# Patient Record
Sex: Male | Born: 1940 | Race: White | Hispanic: No | Marital: Married | Smoking: Former smoker
Health system: Southern US, Community
[De-identification: ages and names within clinical notes are randomized; demographics above are authoritative.]

## PROBLEM LIST (undated history)

## (undated) DIAGNOSIS — J189 Pneumonia, unspecified organism: Secondary | ICD-10-CM

## (undated) DIAGNOSIS — M5136 Other intervertebral disc degeneration, lumbar region: Secondary | ICD-10-CM

## (undated) DIAGNOSIS — J449 Chronic obstructive pulmonary disease, unspecified: Secondary | ICD-10-CM

## (undated) DIAGNOSIS — I639 Cerebral infarction, unspecified: Secondary | ICD-10-CM

## (undated) DIAGNOSIS — R972 Elevated prostate specific antigen [PSA]: Secondary | ICD-10-CM

## (undated) DIAGNOSIS — M51369 Other intervertebral disc degeneration, lumbar region without mention of lumbar back pain or lower extremity pain: Secondary | ICD-10-CM

## (undated) DIAGNOSIS — E119 Type 2 diabetes mellitus without complications: Secondary | ICD-10-CM

## (undated) DIAGNOSIS — I10 Essential (primary) hypertension: Secondary | ICD-10-CM

## (undated) DIAGNOSIS — E785 Hyperlipidemia, unspecified: Secondary | ICD-10-CM

## (undated) DIAGNOSIS — IMO0002 Reserved for concepts with insufficient information to code with codable children: Secondary | ICD-10-CM

## (undated) HISTORY — DX: Essential (primary) hypertension: I10

## (undated) HISTORY — DX: Other intervertebral disc degeneration, lumbar region: M51.36

## (undated) HISTORY — DX: Chronic obstructive pulmonary disease, unspecified: J44.9

## (undated) HISTORY — DX: Pneumonia, unspecified organism: J18.9

## (undated) HISTORY — DX: Other intervertebral disc degeneration, lumbar region without mention of lumbar back pain or lower extremity pain: M51.369

## (undated) HISTORY — DX: Hyperlipidemia, unspecified: E78.5

## (undated) HISTORY — PX: CATARACT EXTRACTION W/ INTRAOCULAR LENS  IMPLANT, BILATERAL: SHX1307

## (undated) HISTORY — DX: Type 2 diabetes mellitus without complications: E11.9

## (undated) HISTORY — DX: Elevated prostate specific antigen (PSA): R97.20

## (undated) HISTORY — DX: Reserved for concepts with insufficient information to code with codable children: IMO0002

---

## 2004-09-06 HISTORY — PX: COLONOSCOPY: SHX174

## 2011-10-18 DIAGNOSIS — J0100 Acute maxillary sinusitis, unspecified: Secondary | ICD-10-CM | POA: Diagnosis not present

## 2011-10-18 DIAGNOSIS — R05 Cough: Secondary | ICD-10-CM | POA: Diagnosis not present

## 2011-11-11 DIAGNOSIS — IMO0001 Reserved for inherently not codable concepts without codable children: Secondary | ICD-10-CM | POA: Diagnosis not present

## 2011-11-11 DIAGNOSIS — E119 Type 2 diabetes mellitus without complications: Secondary | ICD-10-CM | POA: Diagnosis not present

## 2011-11-11 DIAGNOSIS — Z6835 Body mass index (BMI) 35.0-35.9, adult: Secondary | ICD-10-CM | POA: Diagnosis not present

## 2011-11-11 DIAGNOSIS — Z79899 Other long term (current) drug therapy: Secondary | ICD-10-CM | POA: Diagnosis not present

## 2011-11-11 DIAGNOSIS — I10 Essential (primary) hypertension: Secondary | ICD-10-CM | POA: Diagnosis not present

## 2011-11-11 DIAGNOSIS — E785 Hyperlipidemia, unspecified: Secondary | ICD-10-CM | POA: Diagnosis not present

## 2012-02-24 DIAGNOSIS — I10 Essential (primary) hypertension: Secondary | ICD-10-CM | POA: Diagnosis not present

## 2012-02-24 DIAGNOSIS — L570 Actinic keratosis: Secondary | ICD-10-CM | POA: Diagnosis not present

## 2012-02-24 DIAGNOSIS — L821 Other seborrheic keratosis: Secondary | ICD-10-CM | POA: Diagnosis not present

## 2012-02-24 DIAGNOSIS — L820 Inflamed seborrheic keratosis: Secondary | ICD-10-CM | POA: Diagnosis not present

## 2012-02-24 DIAGNOSIS — E785 Hyperlipidemia, unspecified: Secondary | ICD-10-CM | POA: Diagnosis not present

## 2012-02-24 DIAGNOSIS — M67919 Unspecified disorder of synovium and tendon, unspecified shoulder: Secondary | ICD-10-CM | POA: Diagnosis not present

## 2012-05-30 DIAGNOSIS — E785 Hyperlipidemia, unspecified: Secondary | ICD-10-CM | POA: Diagnosis not present

## 2012-05-30 DIAGNOSIS — IMO0001 Reserved for inherently not codable concepts without codable children: Secondary | ICD-10-CM | POA: Diagnosis not present

## 2012-05-30 DIAGNOSIS — I10 Essential (primary) hypertension: Secondary | ICD-10-CM | POA: Diagnosis not present

## 2012-05-30 DIAGNOSIS — N529 Male erectile dysfunction, unspecified: Secondary | ICD-10-CM | POA: Diagnosis not present

## 2012-06-20 DIAGNOSIS — M545 Low back pain, unspecified: Secondary | ICD-10-CM | POA: Diagnosis not present

## 2012-06-20 DIAGNOSIS — I700 Atherosclerosis of aorta: Secondary | ICD-10-CM | POA: Diagnosis not present

## 2012-06-20 DIAGNOSIS — R0602 Shortness of breath: Secondary | ICD-10-CM | POA: Diagnosis not present

## 2012-06-20 DIAGNOSIS — J449 Chronic obstructive pulmonary disease, unspecified: Secondary | ICD-10-CM | POA: Diagnosis not present

## 2012-06-21 DIAGNOSIS — J449 Chronic obstructive pulmonary disease, unspecified: Secondary | ICD-10-CM | POA: Diagnosis not present

## 2012-06-21 DIAGNOSIS — R0602 Shortness of breath: Secondary | ICD-10-CM | POA: Diagnosis not present

## 2012-08-15 DIAGNOSIS — M545 Low back pain: Secondary | ICD-10-CM | POA: Diagnosis not present

## 2012-09-15 DIAGNOSIS — IMO0001 Reserved for inherently not codable concepts without codable children: Secondary | ICD-10-CM | POA: Diagnosis not present

## 2012-09-15 DIAGNOSIS — I10 Essential (primary) hypertension: Secondary | ICD-10-CM | POA: Diagnosis not present

## 2012-09-15 DIAGNOSIS — J209 Acute bronchitis, unspecified: Secondary | ICD-10-CM | POA: Diagnosis not present

## 2012-09-15 DIAGNOSIS — Z6834 Body mass index (BMI) 34.0-34.9, adult: Secondary | ICD-10-CM | POA: Diagnosis not present

## 2012-09-15 DIAGNOSIS — E785 Hyperlipidemia, unspecified: Secondary | ICD-10-CM | POA: Diagnosis not present

## 2012-12-27 DIAGNOSIS — E785 Hyperlipidemia, unspecified: Secondary | ICD-10-CM | POA: Diagnosis not present

## 2012-12-27 DIAGNOSIS — Z6834 Body mass index (BMI) 34.0-34.9, adult: Secondary | ICD-10-CM | POA: Diagnosis not present

## 2012-12-27 DIAGNOSIS — Z1331 Encounter for screening for depression: Secondary | ICD-10-CM | POA: Diagnosis not present

## 2012-12-27 DIAGNOSIS — J449 Chronic obstructive pulmonary disease, unspecified: Secondary | ICD-10-CM | POA: Diagnosis not present

## 2012-12-27 DIAGNOSIS — Z9181 History of falling: Secondary | ICD-10-CM | POA: Diagnosis not present

## 2013-04-04 DIAGNOSIS — Z6834 Body mass index (BMI) 34.0-34.9, adult: Secondary | ICD-10-CM | POA: Diagnosis not present

## 2013-04-04 DIAGNOSIS — I10 Essential (primary) hypertension: Secondary | ICD-10-CM | POA: Diagnosis not present

## 2013-04-04 DIAGNOSIS — E785 Hyperlipidemia, unspecified: Secondary | ICD-10-CM | POA: Diagnosis not present

## 2013-06-14 DIAGNOSIS — J209 Acute bronchitis, unspecified: Secondary | ICD-10-CM | POA: Diagnosis not present

## 2013-06-14 DIAGNOSIS — M5137 Other intervertebral disc degeneration, lumbosacral region: Secondary | ICD-10-CM | POA: Diagnosis not present

## 2013-06-14 DIAGNOSIS — Z23 Encounter for immunization: Secondary | ICD-10-CM | POA: Diagnosis not present

## 2013-07-16 DIAGNOSIS — Z23 Encounter for immunization: Secondary | ICD-10-CM | POA: Diagnosis not present

## 2013-07-16 DIAGNOSIS — J449 Chronic obstructive pulmonary disease, unspecified: Secondary | ICD-10-CM | POA: Diagnosis not present

## 2013-07-16 DIAGNOSIS — Z125 Encounter for screening for malignant neoplasm of prostate: Secondary | ICD-10-CM | POA: Diagnosis not present

## 2013-07-16 DIAGNOSIS — I10 Essential (primary) hypertension: Secondary | ICD-10-CM | POA: Diagnosis not present

## 2013-07-16 DIAGNOSIS — E785 Hyperlipidemia, unspecified: Secondary | ICD-10-CM | POA: Diagnosis not present

## 2013-07-16 DIAGNOSIS — R972 Elevated prostate specific antigen [PSA]: Secondary | ICD-10-CM

## 2013-07-16 HISTORY — DX: Elevated prostate specific antigen (PSA): R97.20

## 2013-08-01 DIAGNOSIS — H6120 Impacted cerumen, unspecified ear: Secondary | ICD-10-CM | POA: Diagnosis not present

## 2013-08-07 DIAGNOSIS — E785 Hyperlipidemia, unspecified: Secondary | ICD-10-CM | POA: Diagnosis not present

## 2013-10-15 DIAGNOSIS — J441 Chronic obstructive pulmonary disease with (acute) exacerbation: Secondary | ICD-10-CM | POA: Diagnosis not present

## 2013-10-15 DIAGNOSIS — Z6834 Body mass index (BMI) 34.0-34.9, adult: Secondary | ICD-10-CM | POA: Diagnosis not present

## 2013-11-12 DIAGNOSIS — IMO0001 Reserved for inherently not codable concepts without codable children: Secondary | ICD-10-CM | POA: Diagnosis not present

## 2013-11-12 DIAGNOSIS — R319 Hematuria, unspecified: Secondary | ICD-10-CM | POA: Diagnosis not present

## 2013-11-12 DIAGNOSIS — H6120 Impacted cerumen, unspecified ear: Secondary | ICD-10-CM | POA: Diagnosis not present

## 2013-11-12 DIAGNOSIS — Z6834 Body mass index (BMI) 34.0-34.9, adult: Secondary | ICD-10-CM | POA: Diagnosis not present

## 2013-11-12 DIAGNOSIS — E785 Hyperlipidemia, unspecified: Secondary | ICD-10-CM | POA: Diagnosis not present

## 2013-11-12 DIAGNOSIS — I10 Essential (primary) hypertension: Secondary | ICD-10-CM | POA: Diagnosis not present

## 2014-01-14 DIAGNOSIS — Z1211 Encounter for screening for malignant neoplasm of colon: Secondary | ICD-10-CM | POA: Diagnosis not present

## 2014-01-14 DIAGNOSIS — E119 Type 2 diabetes mellitus without complications: Secondary | ICD-10-CM | POA: Diagnosis not present

## 2014-02-12 DIAGNOSIS — K621 Rectal polyp: Secondary | ICD-10-CM | POA: Diagnosis not present

## 2014-02-12 DIAGNOSIS — K620 Anal polyp: Secondary | ICD-10-CM | POA: Diagnosis not present

## 2014-02-12 DIAGNOSIS — Z1211 Encounter for screening for malignant neoplasm of colon: Secondary | ICD-10-CM | POA: Diagnosis not present

## 2014-02-12 DIAGNOSIS — D126 Benign neoplasm of colon, unspecified: Secondary | ICD-10-CM | POA: Diagnosis not present

## 2014-02-12 DIAGNOSIS — K5730 Diverticulosis of large intestine without perforation or abscess without bleeding: Secondary | ICD-10-CM | POA: Diagnosis not present

## 2014-02-12 DIAGNOSIS — I10 Essential (primary) hypertension: Secondary | ICD-10-CM | POA: Diagnosis not present

## 2014-02-12 DIAGNOSIS — E119 Type 2 diabetes mellitus without complications: Secondary | ICD-10-CM | POA: Diagnosis not present

## 2014-02-12 LAB — HM COLONOSCOPY: HM COLON: ABNORMAL

## 2014-02-27 DIAGNOSIS — E785 Hyperlipidemia, unspecified: Secondary | ICD-10-CM | POA: Diagnosis not present

## 2014-02-27 DIAGNOSIS — Z6833 Body mass index (BMI) 33.0-33.9, adult: Secondary | ICD-10-CM | POA: Diagnosis not present

## 2014-02-27 DIAGNOSIS — L821 Other seborrheic keratosis: Secondary | ICD-10-CM | POA: Diagnosis not present

## 2014-02-27 DIAGNOSIS — I10 Essential (primary) hypertension: Secondary | ICD-10-CM | POA: Diagnosis not present

## 2014-02-27 DIAGNOSIS — IMO0001 Reserved for inherently not codable concepts without codable children: Secondary | ICD-10-CM | POA: Diagnosis not present

## 2014-02-27 DIAGNOSIS — D1739 Benign lipomatous neoplasm of skin and subcutaneous tissue of other sites: Secondary | ICD-10-CM | POA: Diagnosis not present

## 2014-02-27 DIAGNOSIS — L570 Actinic keratosis: Secondary | ICD-10-CM | POA: Diagnosis not present

## 2014-02-27 DIAGNOSIS — J449 Chronic obstructive pulmonary disease, unspecified: Secondary | ICD-10-CM | POA: Diagnosis not present

## 2014-02-27 DIAGNOSIS — Z1331 Encounter for screening for depression: Secondary | ICD-10-CM | POA: Diagnosis not present

## 2014-02-27 DIAGNOSIS — Z9181 History of falling: Secondary | ICD-10-CM | POA: Diagnosis not present

## 2014-03-28 DIAGNOSIS — J029 Acute pharyngitis, unspecified: Secondary | ICD-10-CM | POA: Diagnosis not present

## 2014-03-28 DIAGNOSIS — J40 Bronchitis, not specified as acute or chronic: Secondary | ICD-10-CM | POA: Diagnosis not present

## 2014-06-03 DIAGNOSIS — Z79899 Other long term (current) drug therapy: Secondary | ICD-10-CM | POA: Diagnosis not present

## 2014-06-03 DIAGNOSIS — E785 Hyperlipidemia, unspecified: Secondary | ICD-10-CM | POA: Diagnosis not present

## 2014-06-03 DIAGNOSIS — Z6834 Body mass index (BMI) 34.0-34.9, adult: Secondary | ICD-10-CM | POA: Diagnosis not present

## 2014-06-03 DIAGNOSIS — IMO0001 Reserved for inherently not codable concepts without codable children: Secondary | ICD-10-CM | POA: Diagnosis not present

## 2014-06-03 DIAGNOSIS — I10 Essential (primary) hypertension: Secondary | ICD-10-CM | POA: Diagnosis not present

## 2014-06-03 DIAGNOSIS — Z23 Encounter for immunization: Secondary | ICD-10-CM | POA: Diagnosis not present

## 2014-06-03 LAB — HEMOGLOBIN A1C: Hgb A1c MFr Bld: 9.5 % — AB (ref 4.0–6.0)

## 2014-06-03 LAB — LIPID PANEL
Cholesterol: 90 mg/dL (ref 0–200)
HDL: 39 mg/dL (ref 35–70)
LDL Cholesterol: 24 mg/dL
LDL/HDL RATIO: 0.6
TRIGLYCERIDES: 133 mg/dL (ref 40–160)

## 2014-06-03 LAB — HM DIABETES FOOT EXAM: HM Diabetic Foot Exam: NORMAL

## 2014-07-15 LAB — HEPATIC FUNCTION PANEL
ALT: 15 U/L (ref 10–40)
AST: 14 U/L (ref 14–40)
Alkaline Phosphatase: 123 U/L (ref 25–125)
Bilirubin, Total: 0.5 mg/dL

## 2014-07-15 LAB — BASIC METABOLIC PANEL
BUN: 19 mg/dL (ref 4–21)
CREATININE: 0.8 mg/dL (ref 0.6–1.3)
GLUCOSE: 162 mg/dL
Potassium: 4.8 mmol/L (ref 3.4–5.3)
SODIUM: 136 mmol/L — AB (ref 137–147)

## 2014-08-21 ENCOUNTER — Encounter: Payer: Self-pay | Admitting: Internal Medicine

## 2014-08-21 ENCOUNTER — Ambulatory Visit (INDEPENDENT_AMBULATORY_CARE_PROVIDER_SITE_OTHER): Payer: Medicare Other | Admitting: Internal Medicine

## 2014-08-21 VITALS — BP 122/64 | HR 81 | Temp 98.0°F | Resp 12 | Ht 72.0 in | Wt 260.0 lb

## 2014-08-21 DIAGNOSIS — E1165 Type 2 diabetes mellitus with hyperglycemia: Secondary | ICD-10-CM | POA: Insufficient documentation

## 2014-08-21 DIAGNOSIS — N182 Chronic kidney disease, stage 2 (mild): Secondary | ICD-10-CM | POA: Insufficient documentation

## 2014-08-21 DIAGNOSIS — R809 Proteinuria, unspecified: Secondary | ICD-10-CM | POA: Diagnosis not present

## 2014-08-21 DIAGNOSIS — E1169 Type 2 diabetes mellitus with other specified complication: Secondary | ICD-10-CM | POA: Insufficient documentation

## 2014-08-21 DIAGNOSIS — IMO0002 Reserved for concepts with insufficient information to code with codable children: Secondary | ICD-10-CM | POA: Insufficient documentation

## 2014-08-21 DIAGNOSIS — E1129 Type 2 diabetes mellitus with other diabetic kidney complication: Secondary | ICD-10-CM | POA: Insufficient documentation

## 2014-08-21 MED ORDER — INSULIN PEN NEEDLE 32G X 4 MM MISC: Status: DC

## 2014-08-21 MED ORDER — GLIPIZIDE ER 5 MG PO TB24: 10.0000 mg | ORAL_TABLET | Freq: Every day | ORAL | Status: DC

## 2014-08-21 MED ORDER — INSULIN GLARGINE 300 UNIT/ML ~~LOC~~ SOPN: 80.0000 [IU] | PEN_INJECTOR | Freq: Every day | SUBCUTANEOUS | Status: DC

## 2014-08-21 NOTE — Patient Instructions (Signed)
Please continue Metformin 1000 mg 2x a day. Stop Lantus. Start Toujeo 80 units at bedtime. Start Glipizide XL 5 mg in am. In 1 week, if sugars later in the day are higher than 150, please increase to 10 mg daily in am.  Please return in 1 month with your sugar log.   PATIENT INSTRUCTIONS FOR TYPE 2 DIABETES:  **Please join MyChart!** - see attached instructions about how to join if you have not done so already.  DIET AND EXERCISE Diet and exercise is an important part of diabetic treatment.  We recommended aerobic exercise in the form of brisk walking (working between 40-60% of maximal aerobic capacity, similar to brisk walking) for 150 minutes per week (such as 30 minutes five days per week) along with 3 times per week performing 'resistance' training (using various gauge rubber tubes with handles) 5-10 exercises involving the major muscle groups (upper body, lower body and core) performing 10-15 repetitions (or near fatigue) each exercise. Start at half the above goal but build slowly to reach the above goals. If limited by weight, joint pain, or disability, we recommend daily walking in a swimming pool with water up to waist to reduce pressure from joints while allow for adequate exercise.    BLOOD GLUCOSES Monitoring your blood glucoses is important for continued management of your diabetes. Please check your blood glucoses 2-4 times a day: fasting, before meals and at bedtime (you can rotate these measurements - e.g. one day check before the 3 meals, the next day check before 2 of the meals and before bedtime, etc.).   HYPOGLYCEMIA (low blood sugar) Hypoglycemia is usually a reaction to not eating, exercising, or taking too much insulin/ other diabetes drugs.  Symptoms include tremors, sweating, hunger, confusion, headache, etc. Treat IMMEDIATELY with 15 grams of Carbs: . 4 glucose tablets .  cup regular juice/soda . 2 tablespoons raisins . 4 teaspoons sugar . 1 tablespoon  honey Recheck blood glucose in 15 mins and repeat above if still symptomatic/blood glucose <100.  RECOMMENDATIONS TO REDUCE YOUR RISK OF DIABETIC COMPLICATIONS: * Take your prescribed MEDICATION(S) * Follow a DIABETIC diet: Complex carbs, fiber rich foods, (monounsaturated and polyunsaturated) fats * AVOID saturated/trans fats, high fat foods, >2,300 mg salt per day. * EXERCISE at least 5 times a week for 30 minutes or preferably daily.  * DO NOT SMOKE OR DRINK more than 1 drink a day. * Check your FEET every day. Do not wear tightfitting shoes. Contact us if you develop an ulcer * See your EYE doctor once a year or more if needed * Get a FLU shot once a year * Get a PNEUMONIA vaccine once before and once after age 62 years  GOALS:  * Your Hemoglobin A1c of <7%  * fasting sugars need to be <130 * after meals sugars need to be <180 (2h after you start eating) * Your Systolic BP should be 947 or lower  * Your Diastolic BP should be 80 or lower  * Your HDL (Good Cholesterol) should be 40 or higher  * Your LDL (Bad Cholesterol) should be 100 or lower. * Your Triglycerides should be 150 or lower  * Your Urine microalbumin (kidney function) should be <30 * Your Body Mass Index should be 25 or lower    Please consider the following ways to cut down carbs and fat and increase fiber and micronutrients in your diet: - substitute whole grain for white bread or pasta - substitute brown rice for white  rice - substitute 90-calorie flat bread pieces for slices of bread when possible - substitute sweet potatoes or yams for white potatoes - substitute humus for margarine - substitute tofu for cheese when possible - substitute almond or rice milk for regular milk (would not drink soy milk daily due to concern for soy estrogen influence on breast cancer risk) - substitute dark chocolate for other sweets when possible - substitute water - can add lemon or orange slices for taste - for diet sodas  (artificial sweeteners will trick your body that you can eat sweets without getting calories and will lead you to overeating and weight gain in the long run) - do not skip breakfast or other meals (this will slow down the metabolism and will result in more weight gain over time)  - can try smoothies made from fruit and almond/rice milk in am instead of regular breakfast - can also try old-fashioned (not instant) oatmeal made with almond/rice milk in am - order the dressing on the side when eating salad at a restaurant (pour less than half of the dressing on the salad) - eat as little meat as possible - can try juicing, but should not forget that juicing will get rid of the fiber, so would alternate with eating raw veg./fruits or drinking smoothies - use as little oil as possible, even when using olive oil - can dress a salad with a mix of balsamic vinegar and lemon juice, for e.g. - use agave nectar, stevia sugar, or regular sugar rather than artificial sweateners - steam or broil/roast veggies  - snack on veggies/fruit/nuts (unsalted, preferably) when possible, rather than processed foods - reduce or eliminate aspartame in diet (it is in diet sodas, chewing gum, etc) Read the labels!  Try to read Dr. Janene Harvey book: "Program for Reversing Diabetes" for other ideas for healthy eating.

## 2014-08-21 NOTE — Progress Notes (Signed)
Patient ID: Mathew Baker, male   DOB: Oct 21, 1940, 73 y.o.   MRN: [id number]  HPI: Mathew Baker is a 73 y.o.-year-old male, referred by his PCP, Dr.Schultz, for management of DM2 (agent orange), dx in ~1995, insulin-dependent, uncontrolled, with complications (microalbuminuria)  Last hemoglobin A1c was: 06/03/2014: HbA1c 9.5% 02/27/2014: HbA1c 8.8% Prev. 7.5-9%  Pt is on a regimen of: - Metformin 1000 mg po bid - Lantus (vial) 70 units bid He was on Glipizide >> stopped b/c diarrhea.  Pt checks his sugars 2x a day and they are: - am: 90-130 - 2h after b'fast: n/c - before lunch: n/c - 2h after lunch: n/c - before dinner: n/c - 2h after dinner: n/c - bedtime: 180-212 - nighttime:  n/c + lows. Lowest sugar was 60; he has hypoglycemia awareness at 70.  Highest sugar was 250.  Glucometer: Precision  Pt's meals are: - Breakfast: toast + PB or jelly; sometimes 2 eggs; coffee + cream - Lunch: skips lunch; sometimes banana or bologna sandwich  - Dinner: meat + veggies + starch - Snacks: nabs, fruit: oranges, bananas  He walks 3x a week.  - Last BUN/creatinine:  06/03/2014: 13/0.90 - has MAU: ACR 46.4  << 54.4 On Lisinopril 40 mg daily.  - last set of lipids: 06/03/2014: 90/133/39/24 On Gemfibrozil 600 mg 2x a day, Fish oil, Atorvastatin.  - last eye exam was in 07/2014. No DR. + L glaucoma  - no numbness and tingling in his feet.  Pt has FH of DM in mother.  ROS: Constitutional: + weight gain, + fatigue, no subjective hyperthermia/hypothermia, + mm aches Eyes: + blurry vision (with low sugars), no xerophthalmia ENT: no sore throat, no nodules palpated in throat, no dysphagia/odynophagia, no hoarseness, + tinnitus Cardiovascular: no CP/+ SOB/no palpitations/leg swelling Respiratory: + cough/+ SOB/+ wheezing Gastrointestinal: no N/V/D/C Musculoskeletal: no muscle/joint aches Skin: no rashes, + hair loss Neurological: no  tremors/numbness/tingling/dizziness Psychiatric: no depression/anxiety + diff with erections, + low libido  Past Medical History  Diagnosis Date  . Diabetes mellitus without complication   . Hypertension   . Hyperlipidemia   . Chronic airway obstruction   . Pneumonia, organism unspecified   . Osteoarthritis of lower extremity   . Degeneration of intervertebral disc of lumbar region   . Essential hypertension, benign   . PSA elevation 07/16/2013    Normal   Past Surgical History  Procedure Laterality Date  . Cataract extraction w/ intraocular lens  implant, bilateral    . Colonoscopy  2006    Normal: Repeat in 10 yrs   History   Social History Main Topics  . Smoking status: Former Smoker    Quit date: 09/06/1958  . Smokeless tobacco: Not on file  . Alcohol Use: No  . 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 atorvastatin (LIPITOR) 40 MG tablet   Oral   Take 40 mg by mouth daily.         Marland Kitchen gemfibrozil (LOPID) 600 MG tablet   Oral   Take 600 mg by mouth 2 (two) times daily before a meal.         . lisinopril (PRINIVIL,ZESTRIL) 40 MG tablet   Oral   Take 40 mg by mouth daily.         Marland Kitchen  Magnesium Oxide 420 MG TABS   Oral   Take 1 tablet by mouth daily.         . metFORMIN (GLUCOPHAGE) 1000 MG tablet   Oral   Take 1,000 mg by mouth 2 (two) times daily with a meal.         . prazosin (MINIPRESS) 1 MG capsule   Oral   Take 1 mg by mouth at bedtime.         . tamsulosin (FLOMAX) 0.4 MG CAPS capsule   Oral   Take 0.4 mg by mouth.         . vitamin B-12 (CYANOCOBALAMIN) 250 MCG tablet   Oral   Take 250 mcg by mouth daily.         Marland Kitchen gabapentin (NEURONTIN) 100 MG capsule   Oral   Take 100 mg by mouth daily.         . Insulin Glargine 100 UNIT/ML    Subcutaneous   Inject 70  Units into the skin 2x a day   6 pen   2   . Omega-3 Fatty Acids (FISH OIL) 1000 MG CAPS   Oral   Take 2 capsules by mouth 2 (two) times daily.          No Known Allergies  FH: see HPI  PE: BP 122/64 mmHg  Pulse 81  Temp(Src) 98 F (36.7 C) (Oral)  Resp 12  Ht 6' (1.829 m)  Wt 260 lb (117.935 kg)  BMI 35.25 kg/m2  SpO2 98% Wt Readings from Last 3 Encounters:  08/21/14 260 lb (117.935 kg)   Constitutional: obese, in NAD Eyes: PERRLA, EOMI, no exophthalmos ENT: moist mucous membranes, no thyromegaly, no cervical lymphadenopathy Cardiovascular: RRR, No MRG Respiratory: CTA B Gastrointestinal: abdomen soft, NT, ND, BS+ Musculoskeletal: no deformities, strength intact in all 4 Skin: moist, warm, no rashes Neurological: no tremor with outstretched hands, DTR normal in all 4  ASSESSMENT: 1. DM2, insulin-dependent, uncontrolled, with complications - MAU  PLAN:  1. Patient with long-standing, uncontrolled diabetes, on oral antidiabetic regimen + a large dose of basal insulin, which is insufficient. Sugars at target in am but increase throughout the day, "staircase effect" >> a sign of not enough mealtime coverage. To modify his DM regimen, we have to be mindful of his age and his insurance - We discussed about options for treatment, and I suggested to switch from Lantus to [facility] for better absorption, will decrease the dose to ~ 1/2, and will add Glipizide for mealtime coverage: Patient Instructions  Please continue Metformin 1000 mg 2x a day. Stop Lantus. Start Toujeo 80 units at bedtime. Start Glipizide XL 5 mg in am. In 1 week, if sugars later in the day are higher than 150, please increase to 10 mg daily in am.  Please return in 1 month with your sugar log.   - continue checking sugars at different times of the day - check 2 times a day, rotating checks - given sugar log and advised how to fill it and to bring it at next appt  - given foot care handout and explained  the principles  - given instructions for hypoglycemia management "15-15 rule"  - advised for yearly eye exams >> he is UTD - advised not to skip meals - Return to clinic in 1 mo with sugar log >> will check HbA1c then

## 2014-08-29 ENCOUNTER — Other Ambulatory Visit: Payer: Self-pay | Admitting: *Deleted

## 2014-08-29 ENCOUNTER — Telehealth: Payer: Self-pay | Admitting: Internal Medicine

## 2014-08-29 MED ORDER — INSULIN GLARGINE 300 UNIT/ML ~~LOC~~ SOPN: 80.0000 [IU] | PEN_INJECTOR | Freq: Every day | SUBCUTANEOUS | Status: DC

## 2014-08-29 NOTE — Telephone Encounter (Signed)
Called pt back. He stated that at [facility]) they do not carry Toujeo. Pt does not have enough medication to last him (the rx only last for 18 days). Pt is getting medication locally for now, until he goes back to the [place] at [facility]. Pt would like a refill for 90 days for the Toujeo. Please print. Thank you.

## 2014-08-29 NOTE — Telephone Encounter (Signed)
OK to print and fx the 90 day Toujeo Rx.

## 2014-08-29 NOTE — Telephone Encounter (Signed)
Pt would like a call back if there is another med to rx he wants to pick up the rx and bring it to [place] himself

## 2014-08-29 NOTE — Telephone Encounter (Signed)
Pt wants a 66 day rx when he returns in Jan. Pt just needed more pens to last until Jan 22, his appt with Dr Cruzita Lederer. 6 pens is not enough for 30 days. New rx for 8 pens sent to pt's pharmacy. Pt aware.

## 2014-08-29 NOTE — Telephone Encounter (Signed)
Pt has rx for Toujeo but the rx is not going to last more than 18 days. Pt usually gets his meds through the [place] and they do not carry Toujeo. He had to get it at [facility] and it is too expensive. Can we offer an alternate?

## 2014-09-27 ENCOUNTER — Ambulatory Visit: Payer: Medicare Other | Admitting: Internal Medicine

## 2014-09-30 ENCOUNTER — Other Ambulatory Visit: Payer: Self-pay | Admitting: *Deleted

## 2014-09-30 ENCOUNTER — Other Ambulatory Visit: Payer: Self-pay

## 2014-09-30 ENCOUNTER — Telehealth: Payer: Self-pay | Admitting: Internal Medicine

## 2014-09-30 MED ORDER — METFORMIN HCL 1000 MG PO TABS
1000.0000 mg | ORAL_TABLET | Freq: Two times a day (BID) | ORAL | Status: DC
Start: 2014-09-30 — End: 2014-09-30

## 2014-09-30 MED ORDER — INSULIN GLARGINE 100 UNIT/ML SOLOSTAR PEN: 80.0000 [IU] | PEN_INJECTOR | Freq: Every day | SUBCUTANEOUS | Status: DC

## 2014-09-30 MED ORDER — GLIPIZIDE ER 5 MG PO TB24: 10.0000 mg | ORAL_TABLET | Freq: Every day | ORAL | Status: DC

## 2014-09-30 MED ORDER — METFORMIN HCL 1000 MG PO TABS: 1000.0000 mg | ORAL_TABLET | Freq: Two times a day (BID) | ORAL | Status: DC

## 2014-09-30 NOTE — Telephone Encounter (Signed)
Pt was cancelled from Friday due to weather. He is rescheduled to 12/02/14 due to his work travels  Pt needs written script so he can bring bring to [facility] for his insulin. They carry novolog, lantis, novolin he needs 90 day script for one of these three. [place] does not carry the [employer].

## 2014-09-30 NOTE — Telephone Encounter (Signed)
Switched pt back to Lantus, 80 units at bedtime. Rx for 90 days for Lantus, Glipizide and Metformin printed for pt to take to [facility].

## 2014-12-02 ENCOUNTER — Encounter: Payer: Self-pay | Admitting: Internal Medicine

## 2014-12-02 ENCOUNTER — Other Ambulatory Visit: Payer: Self-pay | Admitting: *Deleted

## 2014-12-02 ENCOUNTER — Ambulatory Visit (INDEPENDENT_AMBULATORY_CARE_PROVIDER_SITE_OTHER): Payer: Medicare Other | Admitting: Internal Medicine

## 2014-12-02 VITALS — BP 114/62 | HR 71 | Temp 97.8°F | Resp 14 | Wt 244.0 lb

## 2014-12-02 DIAGNOSIS — R809 Proteinuria, unspecified: Secondary | ICD-10-CM

## 2014-12-02 DIAGNOSIS — E1165 Type 2 diabetes mellitus with hyperglycemia: Secondary | ICD-10-CM

## 2014-12-02 DIAGNOSIS — IMO0002 Reserved for concepts with insufficient information to code with codable children: Secondary | ICD-10-CM

## 2014-12-02 DIAGNOSIS — E1129 Type 2 diabetes mellitus with other diabetic kidney complication: Secondary | ICD-10-CM

## 2014-12-02 LAB — HEMOGLOBIN A1C: Hgb A1c MFr Bld: 8.3 % — ABNORMAL HIGH (ref 4.6–6.5)

## 2014-12-02 MED ORDER — GLUCOSE BLOOD VI STRP: ORAL_STRIP | Status: DC

## 2014-12-02 MED ORDER — INSULIN GLARGINE 100 UNIT/ML SOLOSTAR PEN: PEN_INJECTOR | SUBCUTANEOUS | Status: DC

## 2014-12-02 MED ORDER — INSULIN GLARGINE 100 UNIT/ML ~~LOC~~ SOLN: 50.0000 [IU] | Freq: Two times a day (BID) | SUBCUTANEOUS | Status: DC

## 2014-12-02 NOTE — Patient Instructions (Signed)
Please continue: - Metformin 1000 mg 2x a day.  - Lantus 50 units 2x a day >> try decrease the dose by 5 units every week.   Please stop at the lab.  Please return in 3 months with your sugar log.

## 2014-12-02 NOTE — Progress Notes (Signed)
Patient ID: Mathew Baker, male   DOB: August 15, 1941, 74 y.o.   MRN: [id number]  HPI: Mathew Baker is a 74 y.o.-year-old male, initially referred by his PCP, Dr.Schultz, returning for f/u for DM2 (agent orange), dx in ~1995, insulin-dependent, uncontrolled, with complications (microalbuminuria). Last visit 2 mo ago.  He lost 16 lbs in last 3 months.  He was hospitalized for PTSD.  Last hemoglobin A1c was: 10/15/2014: HbA1c 8.2% 06/03/2014: HbA1c 9.5% 02/27/2014: HbA1c 8.8% Prev. 7.5-9%  Pt is on a regimen of: - Metformin 1000 mg po bid - Lantus (vial) 70 >> 50 units bid [>> Toujeo 80 units at bedtime (started 09/2014) - but not covered] He stopped Glipizide XL 5 mg in am (started 09/2014) >> not started He was on Glipizide >> stopped b/c diarrhea.  Pt checks his sugars 4x a day and they are: - am: 90-130 >> 90-110 - 2h after b'fast: n/c - before lunch: n/c - 2h after lunch: n/c >> 150s - before dinner: n/c  - 2h after dinner: n/c >> 150-160 - bedtime: 180-212 >> n/c - nighttime:  n/c + lows. Lowest sugar was 60; he has hypoglycemia awareness at 70.  Highest sugar was 250 >> 160s now.  Glucometer: Precision  Pt's meals are: - Breakfast: toast + PB or jelly; sometimes 2 eggs; coffee + cream - Lunch: skips lunch; sometimes banana or bologna sandwich  - Dinner: meat + veggies + starch - Snacks: nabs, fruit: oranges, bananas  He walks 3x a week.  - Last BUN/creatinine:  06/03/2014: 13/0.90 - has MAU: ACR 46.4  << 54.4 On Lisinopril 40 mg daily.  - last set of lipids: 06/03/2014: 90/133/39/24 On Gemfibrozil 600 mg 2x a day, Fish oil, Atorvastatin.  - last eye exam was in 07/2014. No DR. + L glaucoma  - no numbness and tingling in his feet.  ROS: Constitutional: + weight loss, no fatigue, no subjective hyperthermia/hypothermia, + poor sleep Eyes: no blurry vision, no xerophthalmia ENT: no sore throat, no nodules palpated in throat, no dysphagia/odynophagia, no  hoarseness, + tinnitus Cardiovascular: no CP/SOB/no palpitations/leg swelling Respiratory: no cough/SOB/wheezing Gastrointestinal: no N/V/D/C Musculoskeletal: no muscle/joint aches Skin: no rashes Neurological: no tremors/numbness/tingling/dizziness + diff with erections  I reviewed pt's medications, allergies, PMH, social hx, family hx, and changes were documented in the history of present illness. Otherwise, unchanged from my initial visit note.  Past Medical History  Diagnosis Date  . Diabetes mellitus without complication   . Hypertension   . Hyperlipidemia   . Chronic airway obstruction   . Pneumonia, organism unspecified   . Osteoarthritis of lower extremity   . Degeneration of intervertebral disc of lumbar region   . Essential hypertension, benign   . PSA elevation 07/16/2013    Normal   Past Surgical History  Procedure Laterality Date  . Cataract extraction w/ intraocular lens  implant, bilateral    . Colonoscopy  2006    Normal: Repeat in 10 yrs   History   Social History Main Topics  . Smoking status: Former Smoker    Quit date: 09/06/1958  . Smokeless tobacco: Not on file  . Alcohol Use: No  . 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 atorvastatin (LIPITOR) 40 MG tablet   Oral  Take 40 mg by mouth daily.         Marland Kitchen gemfibrozil (LOPID) 600 MG tablet   Oral   Take 600 mg by mouth 2 (two) times daily before a meal.         . lisinopril (PRINIVIL,ZESTRIL) 40 MG tablet   Oral   Take 40 mg by mouth daily.         . Magnesium Oxide 420 MG TABS   Oral   Take 1 tablet by mouth daily.         . metFORMIN (GLUCOPHAGE) 1000 MG tablet   Oral   Take 1,000 mg by mouth 2 (two) times daily with a meal.         . prazosin (MINIPRESS) 1 MG capsule   Oral   Take 1 mg by mouth at  bedtime.         . tamsulosin (FLOMAX) 0.4 MG CAPS capsule   Oral   Take 0.4 mg by mouth.         . vitamin B-12 (CYANOCOBALAMIN) 250 MCG tablet   Oral   Take 250 mcg by mouth daily.         Marland Kitchen gabapentin (NEURONTIN) 100 MG capsule   Oral   Take 100 mg by mouth daily.         . Insulin Glargine 100 UNIT/ML    Subcutaneous   Inject 70 Units into the skin 2x a day   6 pen   2   . Omega-3 Fatty Acids (FISH OIL) 1000 MG CAPS   Oral   Take 2 capsules by mouth 2 (two) times daily.          No Known Allergies  FH: see HPI  PE: BP 114/62 mmHg  Pulse 71  Temp(Src) 97.8 F (36.6 C) (Oral)  Resp 14  Wt 244 lb (110.678 kg)  SpO2 97% Body mass index is 33.09 kg/(m^2). Wt Readings from Last 3 Encounters:  12/02/14 244 lb (110.678 kg)  08/21/14 260 lb (117.935 kg)   Constitutional: obese, in NAD Eyes: PERRLA, EOMI, no exophthalmos ENT: moist mucous membranes, no thyromegaly, no cervical lymphadenopathy Cardiovascular: RRR, No MRG Respiratory: CTA B Gastrointestinal: abdomen soft, NT, ND, BS+ Musculoskeletal: no deformities, strength intact in all 4 Skin: moist, warm, no rashes Neurological: no tremor with outstretched hands, DTR normal in all 4  ASSESSMENT: 1. DM2, insulin-dependent, uncontrolled, with complications - MAU  PLAN:  1. Patient with long-standing, uncontrolled diabetes, on oral antidiabetic regimen + a large dose of basal insulin, with much better sugars since last visit, after starting to use the treadmill and decreasing the insulin. He lost 16 lbs! I advised him to try to decrease Lantus further: Patient Instructions  Please continue: - Metformin 1000 mg 2x a day.  - Lantus 50 units 2x a day >> try decrease the dose by 5 units every week.   Please stop at the lab.  Please return in 3 months with your sugar log.   - continue checking sugars at different times of the day - check 2 times a day, rotating checks - advised for yearly eye exams >>  he is UTD - per his request, will check HbA1c today - given more sugar logs - Return to clinic in 3 mo with sugar log  Office Visit on 12/02/2014  Component Date Value Ref Range Status  . Hgb A1c MFr Bld 12/02/2014 8.3* 4.6 - 6.5 % Final   Glycemic Control Guidelines for People with Diabetes:Non  Diabetic:  <6%Goal of Therapy: <7%Additional Action Suggested:  >8%    HbA1c is better.

## 2015-01-13 ENCOUNTER — Telehealth: Payer: Self-pay | Admitting: Internal Medicine

## 2015-01-13 MED ORDER — INSULIN GLARGINE 100 UNIT/ML ~~LOC~~ SOLN
50.0000 [IU] | Freq: Two times a day (BID) | SUBCUTANEOUS | Status: DC
Start: 2015-01-13 — End: 2015-03-25

## 2015-01-13 NOTE — Telephone Encounter (Signed)
Please read note below and advise. Thank you.  

## 2015-01-13 NOTE — Telephone Encounter (Signed)
Yes

## 2015-01-13 NOTE — Telephone Encounter (Signed)
Called pt and lvm advising him that he can pick up the new rx for Lantus at our office.

## 2015-01-13 NOTE — Telephone Encounter (Signed)
Pt called in said he ost his prescription for lantis and was wanting to know if he could get another one , can you lease call pt back [phone]

## 2015-03-17 ENCOUNTER — Ambulatory Visit: Payer: Medicare Other | Admitting: Internal Medicine

## 2015-03-25 ENCOUNTER — Other Ambulatory Visit (INDEPENDENT_AMBULATORY_CARE_PROVIDER_SITE_OTHER): Payer: Medicare Other | Admitting: *Deleted

## 2015-03-25 ENCOUNTER — Ambulatory Visit (INDEPENDENT_AMBULATORY_CARE_PROVIDER_SITE_OTHER): Payer: Medicare Other | Admitting: Internal Medicine

## 2015-03-25 ENCOUNTER — Encounter: Payer: Self-pay | Admitting: Internal Medicine

## 2015-03-25 VITALS — BP 130/82 | HR 67 | Temp 97.9°F | Resp 12 | Wt 252.8 lb

## 2015-03-25 DIAGNOSIS — R809 Proteinuria, unspecified: Secondary | ICD-10-CM | POA: Diagnosis not present

## 2015-03-25 DIAGNOSIS — IMO0002 Reserved for concepts with insufficient information to code with codable children: Secondary | ICD-10-CM

## 2015-03-25 DIAGNOSIS — E1165 Type 2 diabetes mellitus with hyperglycemia: Secondary | ICD-10-CM

## 2015-03-25 DIAGNOSIS — E1129 Type 2 diabetes mellitus with other diabetic kidney complication: Secondary | ICD-10-CM

## 2015-03-25 LAB — POCT GLYCOSYLATED HEMOGLOBIN (HGB A1C): Hemoglobin A1C: 8.3

## 2015-03-25 MED ORDER — METFORMIN HCL 1000 MG PO TABS: 1000.0000 mg | ORAL_TABLET | Freq: Two times a day (BID) | ORAL | Status: DC

## 2015-03-25 MED ORDER — LISINOPRIL 40 MG PO TABS: 40.0000 mg | ORAL_TABLET | Freq: Every day | ORAL | Status: DC

## 2015-03-25 MED ORDER — INSULIN GLARGINE 100 UNIT/ML ~~LOC~~ SOLN: 50.0000 [IU] | Freq: Two times a day (BID) | SUBCUTANEOUS | Status: DC

## 2015-03-25 NOTE — Patient Instructions (Addendum)
Please continue: - Metformin 1000 mg 2x a day. Please decrease:  - Lantus 50 units 2x a day >> try decrease the dose by 5 units every week.  Please return in 3 months with your sugar log.

## 2015-03-25 NOTE — Progress Notes (Signed)
Patient ID: Mathew Baker, male   DOB: [birth year], 74 y.o.   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 Last visit 3.5 mo ago. [facility].  He just came from [place].  Last hemoglobin A1c was: Lab Results  Component Value Date   HGBA1C 8.3* 12/02/2014   HGBA1C 9.5* 06/03/2014  10/15/2014: HbA1c 8.2% 06/03/2014: HbA1c 9.5% 02/27/2014: HbA1c 8.8% Prev. 7.5-9%  Pt is on a regimen of: - Metformin 1000 mg po bid - Lantus (vial) 70 >> 50 units bid >> 50 units id >> 70 units in am and 40-50 units in HS Tried Toujeo 80 units at bedtime (started 09/2014) - but not covered He stopped Glipizide XL 5 mg in am (started 09/2014) >> not started He was on Glipizide >> stopped b/c diarrhea.  Pt checks his sugars 4x a day and they are: - am: 90-130 >> 90-110 >> 60, 110-132 - 2h after b'fast: n/c - before lunch: n/c >> 121-140 - 2h after lunch: n/c >> 150s >> n/c - before dinner: n/c  - 2h after dinner: n/c >> 150-160 >> n/c - bedtime: 180-212 >> n/c >> 119-145 - nighttime:  n/c + lows. Lowest sugar was 60 >> 48 x 2; he has hypoglycemia awareness at 70.  Highest sugar was 250 >> 160 >> 160.  Glucometer: Precision  Pt's meals are: - Breakfast: toast + PB or jelly; sometimes 2 eggs; coffee + cream - Lunch: skips lunch; sometimes banana or bologna sandwich  - Dinner: meat + veggies + starch - Snacks: nabs, fruit: oranges, bananas  He walks 3x a week.  - Last BUN/creatinine:  06/03/2014: 13/0.90 - has MAU: ACR 46.4  << 54.4 On Lisinopril 40 mg daily.  - last set of lipids: 06/03/2014: 90/133/39/24 On Gemfibrozil 600 mg 2x a day, Fish oil, Atorvastatin.  - last eye exam was in 07/2014. No DR. + L glaucoma  - no numbness and tingling in his feet.  ROS: Constitutional: + weight gain, no  fatigue, no subjective hyperthermia/hypothermia Eyes: no blurry vision, no xerophthalmia ENT: no sore throat, no nodules palpated in throat, no dysphagia/odynophagia, no hoarseness, + tinnitus Cardiovascular: no CP/SOB/no palpitations/leg swelling Respiratory: no cough/SOB/wheezing Gastrointestinal: no N/V/D/C Musculoskeletal: no muscle/+ joint aches Skin: no rashes Neurological: no tremors/numbness/tingling/dizziness + diff with erections  I reviewed pt's medications, allergies, PMH, social hx, family hx, and changes were documented in the history of present illness. Otherwise, unchanged from my initial visit note.  Past Medical History  Diagnosis Date  . Diabetes mellitus without complication   . Hypertension   . Hyperlipidemia   . Chronic airway obstruction   . Pneumonia, organism unspecified   . Osteoarthritis of lower extremity   . Degeneration of intervertebral disc of lumbar region   . Essential hypertension, benign   . PSA elevation 07/16/2013    Normal   Past Surgical History  Procedure Laterality Date  . Cataract extraction w/ intraocular lens  implant, bilateral    . Colonoscopy  2006    Normal: Repeat in 10 yrs   History   Social History Main Topics  . Smoking status: Former Smoker    Quit date: 09/06/1958  . Smokeless tobacco: Not on file  . Alcohol Use: No  . 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 atorvastatin (LIPITOR) 40 MG tablet   Oral   Take 40 mg by mouth daily.         Marland Kitchen gemfibrozil (LOPID) 600 MG tablet   Oral   Take 600 mg by mouth 2 (two) times daily before a meal.         . lisinopril (PRINIVIL,ZESTRIL) 40 MG tablet   Oral   Take 40 mg by mouth daily.         . Magnesium Oxide 420 MG TABS   Oral   Take 1 tablet by mouth daily.         . metFORMIN  (GLUCOPHAGE) 1000 MG tablet   Oral   Take 1,000 mg by mouth 2 (two) times daily with a meal.         . prazosin (MINIPRESS) 1 MG capsule   Oral   Take 1 mg by mouth at bedtime.         . tamsulosin (FLOMAX) 0.4 MG CAPS capsule   Oral   Take 0.4 mg by mouth.         . vitamin B-12 (CYANOCOBALAMIN) 250 MCG tablet   Oral   Take 250 mcg by mouth daily.         Marland Kitchen gabapentin (NEURONTIN) 100 MG capsule   Oral   Take 100 mg by mouth daily.         . Insulin Glargine 100 UNIT/ML    Subcutaneous   Inject 70 Units into the skin 2x a day   6 pen   2   . Omega-3 Fatty Acids (FISH OIL) 1000 MG CAPS   Oral   Take 2 capsules by mouth 2 (two) times daily.          No Known Allergies  FH: see HPI  PE: BP 130/82 mmHg  Pulse 67  Temp(Src) 97.9 F (36.6 C) (Oral)  Resp 12  Wt 252 lb 12.8 oz (114.669 kg)  SpO2 97% Body mass index is 34.28 kg/(m^2). Wt Readings from Last 3 Encounters:  03/25/15 252 lb 12.8 oz (114.669 kg)  12/02/14 244 lb (110.678 kg)  08/21/14 260 lb (117.935 kg)   Constitutional: obese, in NAD Eyes: PERRLA, EOMI, no exophthalmos ENT: moist mucous membranes, no thyromegaly, no cervical lymphadenopathy Cardiovascular: RRR, No MRG Respiratory: CTA B Gastrointestinal: abdomen soft, NT, ND, BS+ Musculoskeletal: no deformities, strength intact in all 4 Skin: moist, warm, no rashes Neurological: no tremor with outstretched hands, DTR normal in all 4  ASSESSMENT: 1. DM2, insulin-dependent, uncontrolled, with complications - MAU  PLAN:  1. Patient with long-standing, uncontrolled diabetes, on oral antidiabetic regimen + a large dose of basal insulin, with much better sugars since last visit, however, he increased the Lantus by 20 units in am. He mentions 2 lows since last visit>> high 50s and also 1x 60 >> will try to decrease the Lantus doe to 50 units bid.  I advised him to:  Patient Instructions  Please continue: - Metformin 1000 mg 2x a  day. Please decrease:  - Lantus 50 units 2x a day >> try decrease the dose by 5 units every week.  Please return in 3 months with your sugar log.   - continue checking sugars at different times of the day - check 2 times a day, rotating checks -  advised for yearly eye exams >> he is UTD - per his request, will check HbA1c today - given more sugar logs  - check HbA1c >> 8.3% - Return to clinic in 3 mo with sugar log

## 2015-04-07 ENCOUNTER — Ambulatory Visit: Payer: Medicare Other | Admitting: Internal Medicine

## 2015-06-25 ENCOUNTER — Ambulatory Visit: Payer: Medicare Other | Admitting: Internal Medicine

## 2015-07-18 ENCOUNTER — Telehealth: Payer: Self-pay | Admitting: Internal Medicine

## 2015-07-18 NOTE — Telephone Encounter (Signed)
Patient called and would like to know if he could be referred to a cardiologist?   Please advise    Thank you

## 2015-07-21 NOTE — Telephone Encounter (Signed)
Please read message below and advise.  

## 2015-07-21 NOTE — Telephone Encounter (Signed)
Please get the referral from PCP.

## 2015-07-22 NOTE — Telephone Encounter (Signed)
Called and spoke with pt's wife. Advised her per Dr Arman Filter message. She voiced understanding and will advise pt.

## 2015-07-23 ENCOUNTER — Telehealth: Payer: Self-pay | Admitting: Internal Medicine

## 2015-07-23 NOTE — Telephone Encounter (Signed)
Patient called and would like to see if Dr. Cruzita Lederer can refer him for an EKG Mathew Baker states that he goes to the [place] and they will not refer him    Please advise patient   Thank you

## 2015-07-23 NOTE — Telephone Encounter (Signed)
Please read message below and advise.  

## 2015-07-23 NOTE — Telephone Encounter (Signed)
He can either call Dr. Delena Bali, his regular PCP, or, we can do the EKG here when he comes for his next appointment in December. Does he need to have this done sooner?

## 2015-07-23 NOTE — Telephone Encounter (Signed)
Called pt and lvm advising him per Dr Arman Filter message below. Advised pt to let us know if there is a problem that needs to be addressed sooner by having an EKG now.

## 2015-08-21 ENCOUNTER — Ambulatory Visit: Payer: Medicare Other | Admitting: Internal Medicine

## 2015-08-22 ENCOUNTER — Encounter: Payer: Self-pay | Admitting: Internal Medicine

## 2015-08-22 ENCOUNTER — Ambulatory Visit (INDEPENDENT_AMBULATORY_CARE_PROVIDER_SITE_OTHER): Payer: Medicare Other | Admitting: Internal Medicine

## 2015-08-22 ENCOUNTER — Other Ambulatory Visit (INDEPENDENT_AMBULATORY_CARE_PROVIDER_SITE_OTHER): Payer: Medicare Other | Admitting: *Deleted

## 2015-08-22 VITALS — BP 114/68 | HR 65 | Temp 97.8°F | Resp 12 | Wt 256.0 lb

## 2015-08-22 DIAGNOSIS — E1129 Type 2 diabetes mellitus with other diabetic kidney complication: Secondary | ICD-10-CM | POA: Diagnosis not present

## 2015-08-22 DIAGNOSIS — R809 Proteinuria, unspecified: Secondary | ICD-10-CM

## 2015-08-22 DIAGNOSIS — Z794 Long term (current) use of insulin: Secondary | ICD-10-CM | POA: Diagnosis not present

## 2015-08-22 DIAGNOSIS — E785 Hyperlipidemia, unspecified: Secondary | ICD-10-CM | POA: Diagnosis not present

## 2015-08-22 DIAGNOSIS — IMO0002 Reserved for concepts with insufficient information to code with codable children: Secondary | ICD-10-CM

## 2015-08-22 DIAGNOSIS — E1165 Type 2 diabetes mellitus with hyperglycemia: Secondary | ICD-10-CM

## 2015-08-22 LAB — COMPLETE METABOLIC PANEL WITH GFR
ALT: 19 U/L (ref 9–46)
AST: 17 U/L (ref 10–35)
Albumin: 3.9 g/dL (ref 3.6–5.1)
Alkaline Phosphatase: 68 U/L (ref 40–115)
BUN: 18 mg/dL (ref 7–25)
CHLORIDE: 104 mmol/L (ref 98–110)
CO2: 26 mmol/L (ref 20–31)
Calcium: 9.1 mg/dL (ref 8.6–10.3)
Creat: 0.94 mg/dL (ref 0.70–1.18)
GFR, Est African American: >89 mL/min (ref >=60)
GFR, Est Non African American: 79 mL/min (ref >=60)
GLUCOSE: 270 mg/dL — AB (ref 65–99)
POTASSIUM: 4.7 mmol/L (ref 3.5–5.3)
Sodium: 139 mmol/L (ref 135–146)
Total Bilirubin: 0.8 mg/dL (ref 0.2–1.2)
Total Protein: 6.4 g/dL (ref 6.1–8.1)

## 2015-08-22 LAB — LDL CHOLESTEROL, DIRECT: LDL DIRECT: 28 mg/dL

## 2015-08-22 LAB — LIPID PANEL
CHOL/HDL RATIO: 3
Cholesterol: 104 mg/dL (ref 0–200)
HDL: 34.7 mg/dL — ABNORMAL LOW (ref >39.00)
NonHDL: 69.27
Triglycerides: 216 mg/dL — ABNORMAL HIGH (ref 0.0–149.0)
VLDL: 43.2 mg/dL — AB (ref 0.0–40.0)

## 2015-08-22 LAB — POCT GLYCOSYLATED HEMOGLOBIN (HGB A1C): HEMOGLOBIN A1C: 8.5

## 2015-08-22 MED ORDER — INSULIN GLARGINE 100 UNIT/ML ~~LOC~~ SOLN: 70.0000 [IU] | Freq: Two times a day (BID) | SUBCUTANEOUS | Status: DC

## 2015-08-22 MED ORDER — GLUCOSE BLOOD VI STRP: ORAL_STRIP | Status: DC

## 2015-08-22 MED ORDER — GLIPIZIDE ER 5 MG PO TB24: 5.0000 mg | ORAL_TABLET | Freq: Every day | ORAL | Status: DC

## 2015-08-22 NOTE — Patient Instructions (Addendum)
Please continue: - Lantus 70 units 2x a day  - Metformin 1000 mg 2x a day.   Please add Glipizide XL 5 mg daily in am.  Please stop at the lab.  Please return in 3 months with your sugar log.

## 2015-08-22 NOTE — Progress Notes (Signed)
Patient ID: Mathew Baker, male   DOB: [birth year], 74 y.o.   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 Last visit 5 mo ago. He cancelled 3 appts since last visit. [facility].  He still has CP - did not see Dr Terrence Dupont yet.  Last hemoglobin A1c was: Lab Results  Component Value Date   HGBA1C 8.3 03/25/2015   HGBA1C 8.3* 12/02/2014   HGBA1C 9.5* 06/03/2014  10/15/2014: HbA1c 8.2% 06/03/2014: HbA1c 9.5% 02/27/2014: HbA1c 8.8% Prev. 7.5-9%  Pt is on a regimen of: - Metformin 1000 mg po bid - Lantus (vial) 70 units 2x a day (despite advice to take less than this) Tried Toujeo 80 units at bedtime (started 09/2014) - but not covered He stopped Glipizide XL 5 mg in am (started 09/2014) >> not started >> would not want to start. He was on Glipizide >> stopped b/c diarrhea.  Pt checks his sugars 4x a day and they are: - am: 90-130 >> 90-110 >> 60, 110-132 >> 120-140 - 2h after b'fast: n/c - before lunch: n/c >> 121-140 >> 140s - 2h after lunch: n/c >> 150s >> n/c - before dinner: n/c >> 140s - 2h after dinner: n/c >> 150-160 >> n/c  - bedtime: 180-212 >> n/c >> 119-145 >> 150s - nighttime:  n/c + lows. Lowest sugar was 60 >> 48 x 2; he has hypoglycemia awareness at 70.  Highest sugar was 250 >> 160 >> 160.  Glucometer: Precision  Pt's meals are: - Breakfast: toast + PB or jelly; sometimes 2 eggs; coffee + cream - Lunch: skips lunch; sometimes banana or bologna sandwich  - Dinner: meat + veggies + starch - Snacks: nabs, fruit: oranges, bananas  He walks 3x a week.  - Last BUN/creatinine:  06/03/2014: 13/0.90 - has MAU: ACR 46.4  << 54.4 On Lisinopril 40 mg daily.  - last set of lipids: 06/03/2014: 90/133/39/24 On Gemfibrozil 600 mg 2x a day, Fish oil, Atorvastatin.  - last eye  exam was in 07/2014. No DR. + L glaucoma. Has one today.  - no numbness and tingling in his feet.  ROS: Constitutional: no weight gain, no fatigue, no subjective hyperthermia/hypothermia, + nocturia Eyes: no blurry vision, no xerophthalmia ENT: no sore throat, no nodules palpated in throat, no dysphagia/odynophagia, no hoarseness Cardiovascular: + CP/+ SOB/no palpitations/leg swelling Respiratory: + cough/+ SOB/+ wheezing Gastrointestinal: no N/V/D/C Musculoskeletal: no muscle/+ joint aches Skin: no rashes Neurological: no tremors/numbness/tingling/dizziness  I reviewed pt's medications, allergies, PMH, social hx, family hx, and changes were documented in the history of present illness. Otherwise, unchanged from my initial visit note.  Past Medical History  Diagnosis Date  . Diabetes mellitus without complication ([place])   . Hypertension   . Hyperlipidemia   . Chronic airway obstruction ([place])   . Pneumonia, organism unspecified   . Osteoarthritis of lower extremity   . Degeneration of intervertebral disc of lumbar region   . Essential hypertension, benign   . PSA elevation 07/16/2013    Normal   Past Surgical History  Procedure Laterality Date  . Cataract extraction w/ intraocular lens  implant, bilateral    . Colonoscopy  2006    Normal: Repeat in 10 yrs   History   Social History Main Topics  . Smoking status: Former Smoker    Quit date: 09/06/1958  . Smokeless  tobacco: Not on file  . Alcohol Use: No  . 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 atorvastatin (LIPITOR) 40 MG tablet   Oral   Take 40 mg by mouth daily.         Marland Kitchen gemfibrozil (LOPID) 600 MG tablet   Oral   Take 600 mg by mouth 2 (two) times daily before a meal.         . lisinopril (PRINIVIL,ZESTRIL) 40 MG tablet   Oral   Take  40 mg by mouth daily.         . Magnesium Oxide 420 MG TABS   Oral   Take 1 tablet by mouth daily.         . metFORMIN (GLUCOPHAGE) 1000 MG tablet   Oral   Take 1,000 mg by mouth 2 (two) times daily with a meal.         . prazosin (MINIPRESS) 1 MG capsule   Oral   Take 1 mg by mouth at bedtime.         . tamsulosin (FLOMAX) 0.4 MG CAPS capsule   Oral   Take 0.4 mg by mouth.         . vitamin B-12 (CYANOCOBALAMIN) 250 MCG tablet   Oral   Take 250 mcg by mouth daily.         Marland Kitchen gabapentin (NEURONTIN) 100 MG capsule   Oral   Take 100 mg by mouth daily.         . Insulin Glargine 100 UNIT/ML    Subcutaneous   Inject 70 Units into the skin 2x a day   6 pen   2   . Omega-3 Fatty Acids (FISH OIL) 1000 MG CAPS   Oral   Take 2 capsules by mouth 2 (two) times daily.          No Known Allergies  FH: see HPI  PE: BP 114/68 mmHg  Pulse 65  Temp(Src) 97.8 F (36.6 C) (Oral)  Resp 12  Wt 256 lb (116.121 kg)  SpO2 97% Body mass index is 34.71 kg/(m^2). Wt Readings from Last 3 Encounters:  08/22/15 256 lb (116.121 kg)  03/25/15 252 lb 12.8 oz (114.669 kg)  12/02/14 244 lb (110.678 kg)   Constitutional: obese, in NAD Eyes: PERRLA, EOMI, no exophthalmos ENT: moist mucous membranes, no thyromegaly, no cervical lymphadenopathy Cardiovascular: RRR, No MRG Respiratory: CTA B Gastrointestinal: abdomen soft, NT, ND, BS+ Musculoskeletal: no deformities, strength intact in all 4 Skin: moist, warm, no rashes Neurological: no tremor with outstretched hands, DTR normal in all 4  ASSESSMENT: 1. DM2, insulin-dependent, uncontrolled, with complications - MAU  PLAN:  1. Patient with long-standing, uncontrolled diabetes, on oral antidiabetic regimen + a large dose of basal insulin, withstable sugars, however, he increased the Lantus by 20 units 2x a day... No lows since last visit. Will add a low dose Glipizide XL. - I advised him to:  Patient Instructions  Please  continue: - Lantus 70 units 2x a day  - Metformin 1000 mg 2x a day.   Please add Glipizide XL 5 mg daily in am.  Please stop at the lab.  Please return in 3 months with your sugar log.   - continue checking sugars  at different times of the day - check 2 times a day, rotating checks - advised for yearly eye exams >> he is due - check CMP and Lipid panel today - given more sugar logs  - check HbA1c >> 8.5% (higher) - Return to clinic in 3 mo with sugar log Component     Latest Ref Rng 08/22/2015  Sodium     135 - 146 mmol/L 139  Potassium     3.5 - 5.3 mmol/L 4.7  Chloride     98 - 110 mmol/L 104  CO2     20 - 31 mmol/L 26  Glucose     65 - 99 mg/dL 270 (H)  BUN     7 - 25 mg/dL 18  Creatinine     0.70 - 1.18 mg/dL 0.94  Total Bilirubin     0.2 - 1.2 mg/dL 0.8  Alkaline Phosphatase     40 - 115 U/L 68  AST     10 - 35 U/L 17  ALT     9 - 46 U/L 19  Total Protein     6.1 - 8.1 g/dL 6.4  Albumin     3.6 - 5.1 g/dL 3.9  Calcium     8.6 - 10.3 mg/dL 9.1  GFR, Est African American     >=60 mL/min >89  GFR, Est Non African American     >=60 mL/min 79  Cholesterol     0 - 200 mg/dL 104  Triglycerides     0.0 - 149.0 mg/dL 216.0 (H)  HDL Cholesterol     >39.00 mg/dL 34.70 (L)  VLDL     0.0 - 40.0 mg/dL 43.2 (H)  Total CHOL/HDL Ratio      3  NonHDL      69.27  Direct LDL      28.0

## 2015-08-27 ENCOUNTER — Encounter: Payer: Self-pay | Admitting: *Deleted

## 2015-09-15 ENCOUNTER — Encounter: Payer: Self-pay | Admitting: Internal Medicine

## 2015-09-15 ENCOUNTER — Telehealth: Payer: Self-pay | Admitting: Internal Medicine

## 2015-09-15 NOTE — Telephone Encounter (Signed)
Called pt and lvm advising him to return call.  

## 2015-09-15 NOTE — Telephone Encounter (Signed)
Patient need a letter for the status of his diabetes and his progress sent to the [facility], he ask you to call him.

## 2015-09-15 NOTE — Telephone Encounter (Signed)
Done

## 2015-09-15 NOTE — Telephone Encounter (Signed)
Pt returned call. Pt stated that he needs a letter that advises of his condition, seriousness of his condition (diabetes), length of time of his condition (appr 17+ yrs) and the progress of his diabetic tx. Pt asks that we mail it to him so he can take it he goes to the [facility] hospital. Thank you.

## 2015-10-16 DIAGNOSIS — Z0000 Encounter for general adult medical examination without abnormal findings: Secondary | ICD-10-CM | POA: Diagnosis not present

## 2015-11-20 ENCOUNTER — Ambulatory Visit: Payer: Medicare Other | Admitting: Internal Medicine

## 2016-01-19 ENCOUNTER — Ambulatory Visit: Payer: Medicare Other | Admitting: Internal Medicine

## 2016-02-06 ENCOUNTER — Ambulatory Visit: Payer: Medicare Other | Admitting: Internal Medicine

## 2016-04-20 ENCOUNTER — Ambulatory Visit: Payer: Medicare Other | Admitting: Internal Medicine

## 2016-06-09 ENCOUNTER — Ambulatory Visit: Payer: Medicare Other | Admitting: Internal Medicine

## 2016-06-09 DIAGNOSIS — Z0289 Encounter for other administrative examinations: Secondary | ICD-10-CM

## 2016-12-20 ENCOUNTER — Emergency Department (HOSPITAL_COMMUNITY): Payer: Medicare Other

## 2016-12-20 ENCOUNTER — Encounter (HOSPITAL_COMMUNITY): Payer: Self-pay

## 2016-12-20 ENCOUNTER — Observation Stay (HOSPITAL_COMMUNITY)
Admission: EM | Admit: 2016-12-20 | Discharge: 2016-12-21 | Disposition: A | Discharge status: Home / Self Care | Payer: Medicare Other | Attending: Internal Medicine | Admitting: Internal Medicine

## 2016-12-20 DIAGNOSIS — R0609 Other forms of dyspnea: Secondary | ICD-10-CM | POA: Insufficient documentation

## 2016-12-20 DIAGNOSIS — R05 Cough: Secondary | ICD-10-CM | POA: Insufficient documentation

## 2016-12-20 DIAGNOSIS — IMO0002 Reserved for concepts with insufficient information to code with codable children: Secondary | ICD-10-CM | POA: Diagnosis present

## 2016-12-20 DIAGNOSIS — M25512 Pain in left shoulder: Secondary | ICD-10-CM | POA: Diagnosis not present

## 2016-12-20 DIAGNOSIS — R55 Syncope and collapse: Secondary | ICD-10-CM | POA: Insufficient documentation

## 2016-12-20 DIAGNOSIS — Z794 Long term (current) use of insulin: Secondary | ICD-10-CM | POA: Insufficient documentation

## 2016-12-20 DIAGNOSIS — Z87891 Personal history of nicotine dependence: Secondary | ICD-10-CM | POA: Diagnosis not present

## 2016-12-20 DIAGNOSIS — E1165 Type 2 diabetes mellitus with hyperglycemia: Secondary | ICD-10-CM | POA: Insufficient documentation

## 2016-12-20 DIAGNOSIS — R809 Proteinuria, unspecified: Secondary | ICD-10-CM | POA: Diagnosis not present

## 2016-12-20 DIAGNOSIS — E785 Hyperlipidemia, unspecified: Secondary | ICD-10-CM | POA: Diagnosis not present

## 2016-12-20 DIAGNOSIS — R079 Chest pain, unspecified: Secondary | ICD-10-CM | POA: Diagnosis not present

## 2016-12-20 DIAGNOSIS — E1129 Type 2 diabetes mellitus with other diabetic kidney complication: Secondary | ICD-10-CM | POA: Diagnosis present

## 2016-12-20 DIAGNOSIS — J449 Chronic obstructive pulmonary disease, unspecified: Secondary | ICD-10-CM | POA: Insufficient documentation

## 2016-12-20 DIAGNOSIS — R351 Nocturia: Secondary | ICD-10-CM | POA: Diagnosis not present

## 2016-12-20 DIAGNOSIS — I10 Essential (primary) hypertension: Secondary | ICD-10-CM | POA: Insufficient documentation

## 2016-12-20 DIAGNOSIS — R0789 Other chest pain: Secondary | ICD-10-CM | POA: Diagnosis not present

## 2016-12-20 DIAGNOSIS — R0602 Shortness of breath: Secondary | ICD-10-CM | POA: Diagnosis not present

## 2016-12-20 DIAGNOSIS — R0981 Nasal congestion: Secondary | ICD-10-CM | POA: Diagnosis not present

## 2016-12-20 DIAGNOSIS — Z79899 Other long term (current) drug therapy: Secondary | ICD-10-CM | POA: Insufficient documentation

## 2016-12-20 LAB — CBC
HEMATOCRIT: 39.5 % (ref 39.0–52.0)
Hemoglobin: 13.6 g/dL (ref 13.0–17.0)
MCH: 32.8 pg (ref 26.0–34.0)
MCHC: 34.4 g/dL (ref 30.0–36.0)
MCV: 95.2 fL (ref 78.0–100.0)
Platelets: 196 10*3/uL (ref 150–400)
RBC: 4.15 MIL/uL — ABNORMAL LOW (ref 4.22–5.81)
RDW: 12.7 % (ref 11.5–15.5)
WBC: 7.5 10*3/uL (ref 4.0–10.5)

## 2016-12-20 LAB — GLUCOSE, CAPILLARY: Glucose-Capillary: 239 mg/dL — ABNORMAL HIGH (ref 65–99)

## 2016-12-20 LAB — BASIC METABOLIC PANEL
Anion gap: 9 (ref 5–15)
BUN: 17 mg/dL (ref 6–20)
CHLORIDE: 107 mmol/L (ref 101–111)
CO2: 22 mmol/L (ref 22–32)
Calcium: 9 mg/dL (ref 8.9–10.3)
Creatinine, Ser: 0.95 mg/dL (ref 0.61–1.24)
GFR calc Af Amer: >60 mL/min (ref >60)
GFR calc non Af Amer: >60 mL/min (ref >60)
Glucose, Bld: 193 mg/dL — ABNORMAL HIGH (ref 65–99)
POTASSIUM: 4 mmol/L (ref 3.5–5.1)
SODIUM: 138 mmol/L (ref 135–145)

## 2016-12-20 LAB — I-STAT TROPONIN, ED
TROPONIN I, POC: 0 ng/mL (ref 0.00–0.08)
Troponin i, poc: 0 ng/mL (ref 0.00–0.08)
Troponin i, poc: 0.01 ng/mL (ref 0.00–0.08)

## 2016-12-20 LAB — MAGNESIUM: Magnesium: 1.5 mg/dL — ABNORMAL LOW (ref 1.7–2.4)

## 2016-12-20 LAB — D-DIMER, QUANTITATIVE (NOT AT ARMC): D DIMER QUANT: 0.75 ug{FEU}/mL — AB (ref 0.00–0.50)

## 2016-12-20 LAB — TROPONIN I: Troponin I: <0.03 ng/mL (ref <0.03)

## 2016-12-20 MED ORDER — GUAIFENESIN ER 600 MG PO TB12
600.0000 mg | ORAL_TABLET | Freq: Two times a day (BID) | ORAL | Status: DC
Start: 2016-12-20 — End: 2016-12-21
  Administered 2016-12-20 – 2016-12-21 (×2): 600 mg via ORAL
  Filled 2016-12-20 (×2): qty 1

## 2016-12-20 MED ORDER — SODIUM CHLORIDE 0.9% FLUSH
3.0000 mL | Freq: Two times a day (BID) | INTRAVENOUS | Status: DC
  Administered 2016-12-20: 3 mL via INTRAVENOUS

## 2016-12-20 MED ORDER — ASPIRIN 81 MG PO CHEW
81.0000 mg | CHEWABLE_TABLET | Freq: Every day | ORAL | Status: DC
  Administered 2016-12-21: 81 mg via ORAL
  Filled 2016-12-20: qty 1

## 2016-12-20 MED ORDER — INSULIN ASPART 100 UNIT/ML ~~LOC~~ SOLN
0.0000 [IU] | Freq: Three times a day (TID) | SUBCUTANEOUS | Status: DC
Start: 2016-12-21 — End: 2016-12-21
  Administered 2016-12-21 (×2): 7 [IU] via SUBCUTANEOUS

## 2016-12-20 MED ORDER — INSULIN ASPART 100 UNIT/ML ~~LOC~~ SOLN
0.0000 [IU] | Freq: Every day | SUBCUTANEOUS | Status: DC
  Administered 2016-12-20: 2 [IU] via SUBCUTANEOUS

## 2016-12-20 MED ORDER — MAGNESIUM SULFATE 2 GM/50ML IV SOLN
2.0000 g | Freq: Once | INTRAVENOUS | Status: AC
  Administered 2016-12-20: 2 g via INTRAVENOUS
  Filled 2016-12-20: qty 50

## 2016-12-20 MED ORDER — MAGNESIUM OXIDE 400 (241.3 MG) MG PO TABS
400.0000 mg | ORAL_TABLET | Freq: Every day | ORAL | Status: DC
  Administered 2016-12-21: 400 mg via ORAL
  Filled 2016-12-20: qty 1

## 2016-12-20 MED ORDER — ONDANSETRON HCL 4 MG PO TABS: 4.0000 mg | ORAL_TABLET | Freq: Four times a day (QID) | ORAL | Status: DC | PRN

## 2016-12-20 MED ORDER — ENOXAPARIN SODIUM 60 MG/0.6ML ~~LOC~~ SOLN
50.0000 mg | SUBCUTANEOUS | Status: DC
Start: 2016-12-20 — End: 2016-12-21
  Administered 2016-12-20: 50 mg via SUBCUTANEOUS
  Filled 2016-12-20: qty 0.6

## 2016-12-20 MED ORDER — ACETAMINOPHEN 325 MG PO TABS: 650.0000 mg | ORAL_TABLET | Freq: Four times a day (QID) | ORAL | Status: DC | PRN

## 2016-12-20 MED ORDER — IOPAMIDOL (ISOVUE-370) INJECTION 76%
100.0000 mL | Freq: Once | INTRAVENOUS | Status: AC | PRN
  Administered 2016-12-20: 100 mL via INTRAVENOUS

## 2016-12-20 MED ORDER — ACETAMINOPHEN 650 MG RE SUPP: 650.0000 mg | Freq: Four times a day (QID) | RECTAL | Status: DC | PRN

## 2016-12-20 MED ORDER — INSULIN GLARGINE 100 UNIT/ML ~~LOC~~ SOLN
35.0000 [IU] | Freq: Two times a day (BID) | SUBCUTANEOUS | Status: DC
  Administered 2016-12-20 – 2016-12-21 (×2): 35 [IU] via SUBCUTANEOUS
  Filled 2016-12-20 (×3): qty 0.35

## 2016-12-20 MED ORDER — SODIUM CHLORIDE 0.9 % IV SOLN
INTRAVENOUS | Status: DC
  Administered 2016-12-20 – 2016-12-21 (×2): via INTRAVENOUS

## 2016-12-20 MED ORDER — LISINOPRIL 20 MG PO TABS
40.0000 mg | ORAL_TABLET | Freq: Every day | ORAL | Status: DC
  Administered 2016-12-21: 40 mg via ORAL
  Filled 2016-12-20: qty 2

## 2016-12-20 MED ORDER — ASPIRIN 81 MG PO CHEW
324.0000 mg | CHEWABLE_TABLET | Freq: Once | ORAL | Status: AC
  Administered 2016-12-20: 324 mg via ORAL
  Filled 2016-12-20: qty 4

## 2016-12-20 MED ORDER — PRAZOSIN HCL 1 MG PO CAPS
1.0000 mg | ORAL_CAPSULE | Freq: Every day | ORAL | Status: DC
  Administered 2016-12-20: 1 mg via ORAL
  Filled 2016-12-20 (×2): qty 1

## 2016-12-20 MED ORDER — ONDANSETRON HCL 4 MG/2ML IJ SOLN: 4.0000 mg | Freq: Four times a day (QID) | INTRAMUSCULAR | Status: DC | PRN

## 2016-12-20 MED ORDER — LORATADINE 10 MG PO TABS
10.0000 mg | ORAL_TABLET | Freq: Every day | ORAL | Status: DC
  Administered 2016-12-20 – 2016-12-21 (×2): 10 mg via ORAL
  Filled 2016-12-20 (×2): qty 1

## 2016-12-20 MED ORDER — MAGNESIUM OXIDE 420 MG PO TABS: 1.0000 | ORAL_TABLET | Freq: Every day | ORAL | Status: DC

## 2016-12-20 NOTE — H&P (Addendum)
History and Physical    Hikaru Legacy WCH:[id number] DOB: [birth year] DOA: 12/20/2016  PCP: Followed at the [facility] in [place]  Patient coming from: Home via private vehicle  Chief Complaint: Chest pain, syncope  HPI: Mathew Baker is a 76 y.o. gentleman with a history of HTN, HLD, and diabetes who is followed at the [place].  He presents to the ED tonight for evaluation after having an unwitnessed syncopal episode at home.  He reports a history of chest pain that occurs at rest and with exertion.  It is sharp and typically radiates into his back, under the right shoulder blade.  It has been associated with shortness of breath, diaphoresis, and light-headedness/dizziness.  Symptoms can last for several hours at a time.  He does not take anything in an attempt to relieve the symptoms when they occur.  Today, he had a syncopal episode in his home.  He denies preceding headache, chest pain, or palpitations.  He is not sure of how long he was down, but he was able to get himself up and ask his wife to bring him to the ED.  He has had increased fatigue.  He has had cough and nasal congestion, and what he describes as "flu-like symptoms".  No fever.  No sputum production.  He has had intermittent swelling.  He has nocturia.  ED Course: EKG shows NSR with inverted P waves, left anterior fascicular block.  No acute ST elevation.  Chest xray shows coarse bibasilar markings, atelectasis vs infiltrate.  Head CT negative for acute process.    Troponin negative.  D-Dimer mildly elevated at 0.75.  Magnesium level 1.5.  Urine pending.  CTA chest negative for PE.  Atelectasis, coronary calcifications, and cholelithiasis identified.  The patient received full strength aspirin and IV magnesium in the ED.  He is not having active chest pain.  Hospitalist asked to admit for further evaluation.  Review of Systems: As per HPI otherwise 10 systems reviewed and negative.   Past Medical History:  Diagnosis Date  .  Chronic airway obstruction ([place])   . Degeneration of intervertebral disc of lumbar region   . Diabetes mellitus without complication ([place])   . Essential hypertension, benign   . Hyperlipidemia   . Hypertension   . Osteoarthritis of lower extremity   . Pneumonia, organism unspecified(486)   . PSA elevation 07/16/2013   Normal    Past Surgical History:  Procedure Laterality Date  . CATARACT EXTRACTION W/ INTRAOCULAR LENS  IMPLANT, BILATERAL    . COLONOSCOPY  2006   Normal: Repeat in 10 yrs     reports that he quit smoking about 58 years ago. He has never used smokeless tobacco. He reports that he does not drink alcohol or use drugs.  Retired from [employer].  He is married.  He has one daughter.  No Known Allergies  Family History  Problem Relation Age of Onset  . Diabetes Mother   MGF also had diabetes. Father had a history of heart disease and CABG.   Prior to Admission medications   Medication Sig Start Date End Date Taking? Authorizing Provider  Insulin Pen Needle (CAREFINE PEN NEEDLES) 32G X 4 MM MISC Use 1x a day 08/21/14  Yes Philemon Kingdom, MD  vitamin B-12 (CYANOCOBALAMIN) 250 MCG tablet Take 250 mcg by mouth daily.   Yes Historical Provider, MD  glipiZIDE (GLUCOTROL XL) 5 MG 24 hr tablet Take 1 tablet (5 mg total) by mouth daily with breakfast. Patient not taking: Reported on  12/20/2016 08/22/15   Philemon Kingdom, MD  glucose blood (PRECISION XTRA TEST STRIPS) test strip Use to test blood sugar 2 times daily as instructed. Dx: E11.65 08/22/15   Philemon Kingdom, MD  insulin glargine (LANTUS) 100 UNIT/ML injection Inject 0.7 mLs (70 Units total) into the skin 2 (two) times daily. 08/22/15   Philemon Kingdom, MD  lisinopril (PRINIVIL,ZESTRIL) 40 MG tablet Take 1 tablet (40 mg total) by mouth daily. 03/25/15   Philemon Kingdom, MD  Magnesium Oxide 420 MG TABS Take 1 tablet by mouth daily.    Historical Provider, MD  metFORMIN (GLUCOPHAGE) 1000 MG tablet Take 1 tablet  (1,000 mg total) by mouth 2 (two) times daily with a meal. 03/25/15   Philemon Kingdom, MD  prazosin (MINIPRESS) 1 MG capsule Take 1 mg by mouth at bedtime.    Historical Provider, MD    Physical Exam: Vitals:   12/20/16 1915 12/20/16 1930 12/20/16 1945 12/20/16 2000  BP: (!) 164/88 (!) 142/74 (!) 154/73 129/67  Pulse: 65 67 68 70  Resp:      Temp:      TempSrc:      SpO2: 97% 95% 95% 95%      Constitutional: NAD, calm but anxious Vitals:   12/20/16 1915 12/20/16 1930 12/20/16 1945 12/20/16 2000  BP: (!) 164/88 (!) 142/74 (!) 154/73 129/67  Pulse: 65 67 68 70  Resp:      Temp:      TempSrc:      SpO2: 97% 95% 95% 95%   Eyes: PERRL, lids and conjunctivae normal ENMT: Mucous membranes are moist.  Normal dentition.  Neck: normal appearance, supple, no masses Respiratory: clear to auscultation bilaterally, no wheezing, no crackles. Normal respiratory effort. No accessory muscle use.  Cardiovascular: Normal rate, regular rhythm, Holosystolic murmur heard throughout the precordium.  No extremity edema. 2+ pedal pulses. No carotid bruits.  GI: abdomen is soft and compressible.  No distention.  No tenderness.  Bowel sounds are present. Musculoskeletal:  No joint deformity in upper and lower extremities. Good ROM, no contractures. Normal muscle tone.  Skin: no rashes, warm and dry Neurologic: Sensation intact, Strength symmetric bilaterally. Psychiatric: Normal judgment and insight. Alert and oriented x 3. Normal mood.     Labs on Admission: I have personally reviewed following labs and imaging studies  CBC:  Recent Labs Lab 12/20/16 1520  WBC 7.5  HGB 13.6  HCT 39.5  MCV 95.2  PLT 696   Basic Metabolic Panel:  Recent Labs Lab 12/20/16 1520  NA 138  K 4.0  CL 107  CO2 22  GLUCOSE 193*  BUN 17  CREATININE 0.95  CALCIUM 9.0  MG 1.5*   GFR: CrCl cannot be calculated (Unknown ideal weight.).  Cardiac Enzymes: Troponin negative x 2 in the ED  Urine  analysis: Pending  Radiological Exams on Admission: Dg Chest 2 View  Result Date: 12/20/2016 CLINICAL DATA:  Flu like illness for several days associated with cough and shortness of breath. Syncopal episode today. The patient also reports sharp shoulder pain and numbness in the left arm. EXAM: CHEST  2 VIEW COMPARISON:  None in PACs FINDINGS: The lungs are adequately inflated. The lung markings are coarse at both bases. There is no alveolar infiltrate or pleural effusion. The heart and pulmonary vascularity are normal. There is calcification in the wall of the thoracic aorta. The bony thorax exhibits no acute abnormality. Limited visualization of the left shoulder reveals no bony abnormality. IMPRESSION: Coarse bibasilar lung markings  may reflect atelectasis or early pneumonia. Followup PA and lateral chest X-ray is recommended in 3-4 weeks following trial of antibiotic therapy to ensure resolution and exclude underlying malignancy. Electronically Signed   By: David  Martinique M.D.   On: 12/20/2016 16:16   Ct Head Wo Contrast  Result Date: 12/20/2016 CLINICAL DATA:  76 year old diabetic hypertensive male with syncopal episode today. Not sure if hit head. Initial encounter. EXAM: CT HEAD WITHOUT CONTRAST TECHNIQUE: Contiguous axial images were obtained from the base of the skull through the vertex without intravenous contrast. COMPARISON:  None. FINDINGS: Brain: No intracranial hemorrhage or CT evidence of large acute infarct. No intracranial mass lesion noted on this unenhanced exam. Mild atrophy typical of age without hydrocephalus. Vascular: Vascular calcifications. Skull: No skull fracture. Sinuses/Orbits: Post lens replacement without acute orbital abnormality. Minimal mucosal thickening ethmoid sinus air cells. Other: Mastoid air cells and middle ear cavities are clear. Prominent cerumen on the left. IMPRESSION: No skull fracture or intracranial hemorrhage. No CT evidence of large acute infarct.  Electronically Signed   By: Genia Del M.D.   On: 12/20/2016 18:30   Ct Angio Chest Pe W And/or Wo Contrast  Result Date: 12/20/2016 CLINICAL DATA:  Acute onset of syncope.  Initial encounter. EXAM: CT ANGIOGRAPHY CHEST WITH CONTRAST TECHNIQUE: Multidetector CT imaging of the chest was performed using the standard protocol during bolus administration of intravenous contrast. Multiplanar CT image reconstructions and MIPs were obtained to evaluate the vascular anatomy. CONTRAST:  80 mL of Isovue 370 IV contrast COMPARISON:  Chest radiograph performed earlier today at 4:08 p.m. FINDINGS: Cardiovascular:  There is no evidence of pulmonary embolus. The heart is borderline normal in size. Diffuse coronary artery calcifications are seen. Scattered calcification is noted along the descending thoracic aorta and proximal great vessels. Mediastinum/Nodes: The mediastinum is otherwise unremarkable. No mediastinal lymphadenopathy is seen. No pericardial effusion is identified. The thyroid gland is unremarkable. No axial lymphadenopathy is seen. Lungs/Pleura: Minimal peripheral atelectasis is noted bilaterally. This is slightly nodular in appearance at the right lung apex. No pleural effusion or pneumothorax is seen. No dominant mass is identified. Upper Abdomen: The visualized portions of the liver and spleen are grossly unremarkable, aside from a small calcified granuloma at the right hepatic lobe. Stones are seen dependently within the gallbladder. The gallbladder is otherwise unremarkable. The visualized portions of the pancreas, adrenal glands and kidneys are grossly unremarkable, aside from mild bilateral perinephric stranding. Musculoskeletal: No acute osseous abnormalities are identified. Anterolateral bridging osteophytes are seen along the thoracic spine. The visualized musculature is unremarkable in appearance. Review of the MIP images confirms the above findings. IMPRESSION: 1. No evidence of pulmonary embolus.  2. Minimal peripheral atelectasis noted bilaterally. Lungs otherwise clear. 3. Diffuse coronary artery calcifications seen. 4. Cholelithiasis.  Gallbladder otherwise unremarkable. Electronically Signed   By: Garald Balding M.D.   On: 12/20/2016 18:33    EKG: Independently reviewed. Noted above.  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  PE ruled out.  Patient is concerned that "something is wrong" with his heart. --Arrhythmia high on the differential as cause for syncope; telemetry monitoring --Serial troponin --Check orthostatics --Hydrate with NS --Complete echo in the AM --He will likely need a stress test; NPO after  5AM tomorrow morning --Continue baby aspirin daily  Hypomagnesemia --IV replacement ordered in the ED  History of IDDM --Will order half of home lantus dose for now --SSI coverage AC/HS --Hold metformin; no longer takes glipizide  History of nocturia, probable BPH --Prazosin  HTN --Lisinopril  Cough, congestion --Trial of mucinex, claritin  DVT prophylaxis: Lovenox Code Status: FULL Family Communication: Wife and daughter present in the ED at time of admission. Disposition Plan: To be determined. Consults called: NONE Admission status: Place in observation with telemetry monitoring.   TIME SPENT: 60 minutes   Eber Jones MD [facility] Pager [phone]  If 7PM-7AM, please contact night-coverage [email or web address] Password [facility]  12/20/2016, 8:43 PM

## 2016-12-20 NOTE — ED Provider Notes (Signed)
[facility] DEPT Provider Note   CSN: [id number] Arrival date & time: 12/20/16  1455     History   Chief Complaint Chief Complaint  Patient presents with  . Loss of Consciousness    HPI Mathew Baker is a 76 y.o. male.  The history is provided by the patient.  Loss of Consciousness   This is a new problem. The current episode started 1 to 2 hours ago. The problem occurs constantly. The problem has been resolved. He lost consciousness for a period of less than one minute. The problem is associated with normal activity. Associated symptoms include congestion and malaise/fatigue. Pertinent negatives include abdominal pain, back pain, bladder incontinence, bowel incontinence, chest pain, clumsiness, confusion, diaphoresis, fever, focal sensory loss, focal weakness, headaches, light-headedness, nausea, palpitations, seizures, slurred speech, visual change, vomiting and weakness. He has tried nothing for the symptoms. The treatment provided no relief. His past medical history is significant for DM and HTN. His past medical history does not include CVA or TIA.    Past Medical History:  Diagnosis Date  . Chronic airway obstruction ([place])   . Degeneration of intervertebral disc of lumbar region   . Diabetes mellitus without complication ([place])   . Essential hypertension, benign   . Hyperlipidemia   . Hypertension   . Osteoarthritis of lower extremity   . Pneumonia, organism unspecified(486)   . PSA elevation 07/16/2013   Normal    Patient Active Problem List   Diagnosis Date Noted  . Uncontrolled type 2 diabetes mellitus with microalbuminuria (Daniel) 08/21/2014    Past Surgical History:  Procedure Laterality Date  . CATARACT EXTRACTION W/ INTRAOCULAR LENS  IMPLANT, BILATERAL    . COLONOSCOPY  2006   Normal: Repeat in 10 yrs       Home Medications    Prior to Admission medications   Medication Sig Start Date End Date Taking? Authorizing Provider  Insulin Pen Needle  (CAREFINE PEN NEEDLES) 32G X 4 MM MISC Use 1x a day 08/21/14  Yes Philemon Kingdom, MD  vitamin B-12 (CYANOCOBALAMIN) 250 MCG tablet Take 250 mcg by mouth daily.   Yes Historical Provider, MD  glipiZIDE (GLUCOTROL XL) 5 MG 24 hr tablet Take 1 tablet (5 mg total) by mouth daily with breakfast. Patient not taking: Reported on 12/20/2016 08/22/15   Philemon Kingdom, MD  glucose blood (PRECISION XTRA TEST STRIPS) test strip Use to test blood sugar 2 times daily as instructed. Dx: E11.65 08/22/15   Philemon Kingdom, MD  insulin glargine (LANTUS) 100 UNIT/ML injection Inject 0.7 mLs (70 Units total) into the skin 2 (two) times daily. 08/22/15   Philemon Kingdom, MD  lisinopril (PRINIVIL,ZESTRIL) 40 MG tablet Take 1 tablet (40 mg total) by mouth daily. 03/25/15   Philemon Kingdom, MD  Magnesium Oxide 420 MG TABS Take 1 tablet by mouth daily.    Historical Provider, MD  metFORMIN (GLUCOPHAGE) 1000 MG tablet Take 1 tablet (1,000 mg total) by mouth 2 (two) times daily with a meal. 03/25/15   Philemon Kingdom, MD  prazosin (MINIPRESS) 1 MG capsule Take 1 mg by mouth at bedtime.    Historical Provider, MD    Family History Family History  Problem Relation Age of Onset  . Diabetes Mother     Social History Social History  Substance Use Topics  . Smoking status: Former Smoker    Quit date: 09/06/1958  . Smokeless tobacco: Never Used  . Alcohol use No     Allergies   Patient has no known  allergies.   Review of Systems Review of Systems  Constitutional: Positive for chills, fatigue and malaise/fatigue. Negative for diaphoresis and fever.  HENT: Positive for congestion. Negative for ear pain and sore throat.   Eyes: Negative for pain and visual disturbance.  Respiratory: Positive for shortness of breath (mild shortness of breath). Negative for cough.   Cardiovascular: Positive for syncope. Negative for chest pain and palpitations.  Gastrointestinal: Positive for diarrhea. Negative for abdominal  pain, bowel incontinence, nausea and vomiting.  Genitourinary: Negative for bladder incontinence, dysuria and hematuria.  Musculoskeletal: Positive for arthralgias. Negative for back pain, myalgias and neck pain.  Skin: Negative for color change and rash.  Neurological: Positive for syncope. Negative for focal weakness, seizures, weakness, light-headedness and headaches.  Psychiatric/Behavioral: Negative for confusion.  All other systems reviewed and are negative.    Physical Exam Updated Vital Signs BP (!) 158/80   Pulse 66   Temp 97.9 F (36.6 C) (Oral)   Resp 18   SpO2 98%   Physical Exam  Constitutional: He appears well-developed and well-nourished.  HENT:  Head: Normocephalic and atraumatic.  Eyes: Conjunctivae and EOM are normal.  Neck: Neck supple.  Cardiovascular: Normal rate and regular rhythm.   No murmur heard. Pulmonary/Chest: Effort normal and breath sounds normal. No respiratory distress.  Abdominal: Soft. There is no tenderness.  Musculoskeletal: He exhibits no edema.  Neurological: He is alert. He has normal strength. No cranial nerve deficit or sensory deficit. Coordination normal. GCS eye subscore is 4. GCS verbal subscore is 5. GCS motor subscore is 6.  Skin: Skin is warm and dry.  Psychiatric: He has a normal mood and affect. His speech is normal and behavior is normal.  Nursing note and vitals reviewed.    ED Treatments / Results  Labs (all labs ordered are listed, but only abnormal results are displayed) Labs Reviewed  BASIC METABOLIC PANEL - Abnormal; Notable for the following:       Result Value   Glucose, Bld 193 (*)    All other components within normal limits  CBC - Abnormal; Notable for the following:    RBC 4.15 (*)    All other components within normal limits  MAGNESIUM - Abnormal; Notable for the following:    Magnesium 1.5 (*)    All other components within normal limits  D-DIMER, QUANTITATIVE (NOT AT [facility]) - Abnormal; Notable for the  following:    D-Dimer, Quant 0.75 (*)    All other components within normal limits  URINALYSIS, ROUTINE W REFLEX MICROSCOPIC  CBG MONITORING, ED  I-STAT TROPOININ, ED  I-STAT TROPOININ, ED  Randolm Idol, ED    EKG  EKG Interpretation  Date/Time:  Monday December 20 2016 15:13:40 EDT Ventricular Rate:  90 PR Interval:  154 QRS Duration: 96 QT Interval:  374 QTC Calculation: 457 R Axis:   -73 Text Interpretation:  Unusual P axis, possible ectopic atrial rhythm with Premature atrial complexes Pulmonary disease pattern Left anterior fascicular block Abnormal ECG no previous EKG available abnormal P waves in inferior and lateral leads suggests pulmonary disease Confirmed by LITTLE MD, RACHEL ([id number]) on 12/20/2016 4:45:12 PM       Radiology Dg Chest 2 View  Result Date: 12/20/2016 CLINICAL DATA:  Flu like illness for several days associated with cough and shortness of breath. Syncopal episode today. The patient also reports sharp shoulder pain and numbness in the left arm. EXAM: CHEST  2 VIEW COMPARISON:  None in PACs FINDINGS: The lungs are adequately  inflated. The lung markings are coarse at both bases. There is no alveolar infiltrate or pleural effusion. The heart and pulmonary vascularity are normal. There is calcification in the wall of the thoracic aorta. The bony thorax exhibits no acute abnormality. Limited visualization of the left shoulder reveals no bony abnormality. IMPRESSION: Coarse bibasilar lung markings may reflect atelectasis or early pneumonia. Followup PA and lateral chest X-ray is recommended in 3-4 weeks following trial of antibiotic therapy to ensure resolution and exclude underlying malignancy. Electronically Signed   By: David  Martinique M.D.   On: 12/20/2016 16:16   Ct Head Wo Contrast  Result Date: 12/20/2016 CLINICAL DATA:  76 year old diabetic hypertensive male with syncopal episode today. Not sure if hit head. Initial encounter. EXAM: CT HEAD WITHOUT CONTRAST  TECHNIQUE: Contiguous axial images were obtained from the base of the skull through the vertex without intravenous contrast. COMPARISON:  None. FINDINGS: Brain: No intracranial hemorrhage or CT evidence of large acute infarct. No intracranial mass lesion noted on this unenhanced exam. Mild atrophy typical of age without hydrocephalus. Vascular: Vascular calcifications. Skull: No skull fracture. Sinuses/Orbits: Post lens replacement without acute orbital abnormality. Minimal mucosal thickening ethmoid sinus air cells. Other: Mastoid air cells and middle ear cavities are clear. Prominent cerumen on the left. IMPRESSION: No skull fracture or intracranial hemorrhage. No CT evidence of large acute infarct. Electronically Signed   By: Genia Del M.D.   On: 12/20/2016 18:30   Ct Angio Chest Pe W And/or Wo Contrast  Result Date: 12/20/2016 CLINICAL DATA:  Acute onset of syncope.  Initial encounter. EXAM: CT ANGIOGRAPHY CHEST WITH CONTRAST TECHNIQUE: Multidetector CT imaging of the chest was performed using the standard protocol during bolus administration of intravenous contrast. Multiplanar CT image reconstructions and MIPs were obtained to evaluate the vascular anatomy. CONTRAST:  80 mL of Isovue 370 IV contrast COMPARISON:  Chest radiograph performed earlier today at 4:08 p.m. FINDINGS: Cardiovascular:  There is no evidence of pulmonary embolus. The heart is borderline normal in size. Diffuse coronary artery calcifications are seen. Scattered calcification is noted along the descending thoracic aorta and proximal great vessels. Mediastinum/Nodes: The mediastinum is otherwise unremarkable. No mediastinal lymphadenopathy is seen. No pericardial effusion is identified. The thyroid gland is unremarkable. No axial lymphadenopathy is seen. Lungs/Pleura: Minimal peripheral atelectasis is noted bilaterally. This is slightly nodular in appearance at the right lung apex. No pleural effusion or pneumothorax is seen. No  dominant mass is identified. Upper Abdomen: The visualized portions of the liver and spleen are grossly unremarkable, aside from a small calcified granuloma at the right hepatic lobe. Stones are seen dependently within the gallbladder. The gallbladder is otherwise unremarkable. The visualized portions of the pancreas, adrenal glands and kidneys are grossly unremarkable, aside from mild bilateral perinephric stranding. Musculoskeletal: No acute osseous abnormalities are identified. Anterolateral bridging osteophytes are seen along the thoracic spine. The visualized musculature is unremarkable in appearance. Review of the MIP images confirms the above findings. IMPRESSION: 1. No evidence of pulmonary embolus. 2. Minimal peripheral atelectasis noted bilaterally. Lungs otherwise clear. 3. Diffuse coronary artery calcifications seen. 4. Cholelithiasis.  Gallbladder otherwise unremarkable. Electronically Signed   By: Garald Balding M.D.   On: 12/20/2016 18:33    Procedures Procedures (including critical care time)  Medications Ordered in ED Medications  magnesium sulfate IVPB 2 g 50 mL (2 g Intravenous New Bag/Given 12/20/16 1828)  iopamidol (ISOVUE-370) 76 % injection 100 mL (100 mLs Intravenous Contrast Given 12/20/16 1802)  aspirin chewable tablet 324  mg (324 mg Oral Given 12/20/16 1828)     Initial Impression / Assessment and Plan / ED Course  I have reviewed the triage vital signs and the nursing notes.  Pertinent labs & imaging results that were available during my care of the patient were reviewed by me and considered in my medical decision making (see chart for details).     76 year old white male with history of hypertension and diabetes who presents in the setting of syncope and collapse. Patient reports over the last several weeks he's had influenza-like illness including myalgias, fatigue, shortness of breath. Patient reports shortness of breath and chest pressure have been worsening over the  last several days. Patient reports today's do normal activity when he had a syncopal event. Patient denies any preceding symptoms prior to this. Patient reports he awoke on the ground. Patient did complain of some left shoulder pain prior to this event and some intermittent numbness and tingling in left arm. Patient denies history of bleeding or clotting disorder.  On arrival patient hemodynamically stable and afebrile. EKG revealed T-wave inversions concerning for ectopic sinus node. QRS complexes were regular and no signs of acute ischemia noted. Initial troponin not significantly elevated patient with mild hypomagnesemia which was repleted and no other significant electrolyte amounts. No significant anemia noted. Chest x-ray revealed no acute cardiopulmonary abnormality's. D-dimer elevated. Differential diagnosis includes ACS, pneumonia, pulmonary embolus, arrhythmia. At this time we'll obtain CT head as well as CT/PE scan to further evaluate etiology of symptoms. Due to patient's age and presenting symptoms patient will require admission for further management of syncope of unknown etiology with concern for cardiac etiology.  CT head and CT/PE scan revealedNo acute abnormalities specifically no signs of pneumonia, PE, intracranial hemorrhage, fracture.. Patient remained stable while in the emergency department. Patient will be admitted to medicine team for further management of condition and stable at time of transfer of care.  Final Clinical Impressions(s) / ED Diagnoses   Final diagnoses:  Syncope and collapse  SOB (shortness of breath)  Acute pain of left shoulder  Chest pressure    New Prescriptions New Prescriptions   No medications on file     Esaw Grandchild, MD 12/20/16 Morgan, MD 12/28/16 [phone]

## 2016-12-20 NOTE — ED Notes (Signed)
ED Provider at bedside. 

## 2016-12-20 NOTE — Progress Notes (Signed)
New Admission Note:   Arrival Method: Bed  Mental Orientation: Alert and oriented x 4  Telemetry:3e box 23  Assessment: Completed Skin: Intact Pain:0/10 Tubes: None Safety Measures: Safety Fall Prevention Plan has been discussed  Admission:  [facility] [place] Orientation: Patient has been orientated to the room, unit and staff.  Family: none at bedside   Orders to be reviewed and implemented. Will continue to monitor the patient. Patient refused to be on bed alarm during the night. Will contine to monitor.  Venetia Night, RN Phone: [phone]

## 2016-12-20 NOTE — ED Triage Notes (Signed)
Pt states he has had flu like illness for several days. Today he had a syncopal episode. Pt also states sharp pain in his shoulder blade. He is a&o X4. Skin warm and dry.

## 2016-12-20 NOTE — ED Notes (Signed)
Patient transported to CT 

## 2016-12-20 NOTE — ED Notes (Signed)
Pt just now being brought to room by xray tech.

## 2016-12-20 NOTE — ED Notes (Signed)
Admitting provider at bedside.

## 2016-12-20 NOTE — ED Notes (Signed)
Pt given [place] sandwich, apple juice, and orange juice.

## 2016-12-21 ENCOUNTER — Encounter (HOSPITAL_COMMUNITY)
Admission: EM | Disposition: A | Discharge status: Home / Self Care | Payer: Self-pay | Source: Home / Self Care | Attending: Emergency Medicine

## 2016-12-21 ENCOUNTER — Observation Stay (HOSPITAL_BASED_OUTPATIENT_CLINIC_OR_DEPARTMENT_OTHER): Payer: Medicare Other

## 2016-12-21 DIAGNOSIS — R55 Syncope and collapse: Secondary | ICD-10-CM

## 2016-12-21 DIAGNOSIS — R0781 Pleurodynia: Secondary | ICD-10-CM | POA: Diagnosis not present

## 2016-12-21 DIAGNOSIS — I10 Essential (primary) hypertension: Secondary | ICD-10-CM | POA: Diagnosis not present

## 2016-12-21 DIAGNOSIS — R079 Chest pain, unspecified: Secondary | ICD-10-CM | POA: Diagnosis not present

## 2016-12-21 DIAGNOSIS — Z794 Long term (current) use of insulin: Secondary | ICD-10-CM | POA: Diagnosis not present

## 2016-12-21 DIAGNOSIS — E1129 Type 2 diabetes mellitus with other diabetic kidney complication: Secondary | ICD-10-CM | POA: Diagnosis not present

## 2016-12-21 DIAGNOSIS — E7800 Pure hypercholesterolemia, unspecified: Secondary | ICD-10-CM

## 2016-12-21 DIAGNOSIS — E1165 Type 2 diabetes mellitus with hyperglycemia: Secondary | ICD-10-CM | POA: Diagnosis not present

## 2016-12-21 DIAGNOSIS — R0609 Other forms of dyspnea: Secondary | ICD-10-CM

## 2016-12-21 DIAGNOSIS — R809 Proteinuria, unspecified: Secondary | ICD-10-CM | POA: Diagnosis not present

## 2016-12-21 LAB — URINALYSIS, ROUTINE W REFLEX MICROSCOPIC
BACTERIA UA: NONE SEEN
BILIRUBIN URINE: NEGATIVE
HGB URINE DIPSTICK: NEGATIVE
Ketones, ur: NEGATIVE mg/dL
LEUKOCYTES UA: NEGATIVE
NITRITE: NEGATIVE
PH: 5 (ref 5.0–8.0)
Protein, ur: 30 mg/dL — AB
SPECIFIC GRAVITY, URINE: 1.036 — AB (ref 1.005–1.030)

## 2016-12-21 LAB — CBC
HEMATOCRIT: 37.2 % — AB (ref 39.0–52.0)
Hemoglobin: 12.4 g/dL — ABNORMAL LOW (ref 13.0–17.0)
MCH: 32.2 pg (ref 26.0–34.0)
MCHC: 33.3 g/dL (ref 30.0–36.0)
MCV: 96.6 fL (ref 78.0–100.0)
Platelets: 160 10*3/uL (ref 150–400)
RBC: 3.85 MIL/uL — ABNORMAL LOW (ref 4.22–5.81)
RDW: 13.1 % (ref 11.5–15.5)
WBC: 5.8 10*3/uL (ref 4.0–10.5)

## 2016-12-21 LAB — BASIC METABOLIC PANEL
ANION GAP: 6 (ref 5–15)
BUN: 17 mg/dL (ref 6–20)
CALCIUM: 8.4 mg/dL — AB (ref 8.9–10.3)
CO2: 26 mmol/L (ref 22–32)
Chloride: 108 mmol/L (ref 101–111)
Creatinine, Ser: 0.85 mg/dL (ref 0.61–1.24)
Glucose, Bld: 209 mg/dL — ABNORMAL HIGH (ref 65–99)
Potassium: 4.2 mmol/L (ref 3.5–5.1)
Sodium: 140 mmol/L (ref 135–145)

## 2016-12-21 LAB — NM MYOCAR MULTI W/SPECT W/WALL MOTION / EF
Peak HR: 74 {beats}/min
Rest HR: 63 {beats}/min

## 2016-12-21 LAB — GLUCOSE, CAPILLARY
GLUCOSE-CAPILLARY: 200 mg/dL — AB (ref 65–99)
Glucose-Capillary: 209 mg/dL — ABNORMAL HIGH (ref 65–99)
Glucose-Capillary: 214 mg/dL — ABNORMAL HIGH (ref 65–99)

## 2016-12-21 LAB — APTT: APTT: 28 s (ref 24–36)

## 2016-12-21 LAB — TROPONIN I: Troponin I: <0.03 ng/mL (ref <0.03)

## 2016-12-21 LAB — ECHOCARDIOGRAM COMPLETE
HEIGHTINCHES: 73 in
Weight: 3920 oz

## 2016-12-21 LAB — PROTIME-INR
INR: 1.06
Prothrombin Time: 13.8 seconds (ref 11.4–15.2)

## 2016-12-21 SURGERY — LOOP RECORDER INSERTION

## 2016-12-21 MED ORDER — BENZONATATE 100 MG PO CAPS: 100.0000 mg | ORAL_CAPSULE | Freq: Three times a day (TID) | ORAL | 0 refills | Status: DC | PRN

## 2016-12-21 MED ORDER — GUAIFENESIN ER 600 MG PO TB12: 600.0000 mg | ORAL_TABLET | Freq: Two times a day (BID) | ORAL | 0 refills | Status: DC

## 2016-12-21 MED ORDER — TECHNETIUM TC 99M TETROFOSMIN IV KIT
30.0000 | PACK | Freq: Once | INTRAVENOUS | Status: AC | PRN
  Administered 2016-12-21: 30 via INTRAVENOUS

## 2016-12-21 MED ORDER — TECHNETIUM TC 99M TETROFOSMIN IV KIT
10.0000 | PACK | Freq: Once | INTRAVENOUS | Status: AC | PRN
  Administered 2016-12-21: 10 via INTRAVENOUS

## 2016-12-21 MED ORDER — REGADENOSON 0.4 MG/5ML IV SOLN
INTRAVENOUS | Status: AC
  Filled 2016-12-21: qty 5

## 2016-12-21 MED ORDER — LISINOPRIL 30 MG PO TABS
30.0000 mg | ORAL_TABLET | Freq: Every day | ORAL | 0 refills | Status: DC
Start: 2016-12-21 — End: 2019-03-27

## 2016-12-21 MED ORDER — LIDOCAINE HCL (PF) 1 % IJ SOLN
INTRAMUSCULAR | Status: AC
  Filled 2016-12-21: qty 30

## 2016-12-21 MED ORDER — REGADENOSON 0.4 MG/5ML IV SOLN
0.4000 mg | Freq: Once | INTRAVENOUS | Status: AC
  Administered 2016-12-21: 0.4 mg via INTRAVENOUS
  Filled 2016-12-21: qty 5

## 2016-12-21 NOTE — Progress Notes (Signed)
Pt has gone with monitor to NM for stress test. Denies Any pain SOB of distress vitals stable. Belongs are secure.

## 2016-12-21 NOTE — Progress Notes (Signed)
Echocardiogram 2D Echocardiogram has been performed.  Mathew Baker T Mathew Baker 12/21/2016, 4:02 PM

## 2016-12-21 NOTE — Plan of Care (Signed)
Problem: Nutrition: Goal: Adequate nutrition will be maintained Outcome: Progressing Pt will maintain a heart healthy diet

## 2016-12-21 NOTE — Progress Notes (Signed)
     The patient was seen in nuclear medicine for a lexican myoview. He tolerated the procedure well. No acute ST or TW changes on ECG. Await nuclear images.    Perry Mount PA-C  [facility]

## 2016-12-21 NOTE — Progress Notes (Signed)
Inpatient Diabetes Program Recommendations  AACE/ADA: New Consensus Statement on Inpatient Glycemic Control (2015)  Target Ranges:  Prepandial:   less than 140 mg/dL      Peak postprandial:   less than 180 mg/dL (1-2 hours)      Critically ill patients:  140 - 180 mg/dL   Lab Results  Component Value Date   GLUCAP 209 (H) 12/21/2016   HGBA1C 8.5 08/22/2015    Review of Glycemic Control  Diabetes history: DM2 Outpatient Diabetes medications: Lantus 75 units bid + Novolog 2-3+ glucolto 5 mg (noted pt not taking)  Current orders for Inpatient glycemic control: Lantus 35 units bid + 0-20 tid + 0-5 hs  Inpatient Diabetes Program Recommendations:  Please consider: A1c to determine prehospital glycemic control.  Thank you, Nani Gasser. Daysia Vandenboom, RN, MSN, CDE  Diabetes Coordinator Inpatient Glycemic Control Team Team Pager [phone] (8am-5pm) 12/21/2016 10:51 AM

## 2016-12-21 NOTE — Progress Notes (Signed)
Patient was sleeping and resting during the night. No complaints of pain. Patient stated that he got dizzy when he was standing for orthostatic blood pressure.   Will continue to monitor.   Keimani Laufer, RN

## 2016-12-21 NOTE — Progress Notes (Signed)
Pt is alert and oriented currently getting ECHO performed.

## 2016-12-21 NOTE — Consult Note (Signed)
Reason for Consult: Syncope, dyspnea  Requesting Physician: Posey Pronto  Cardiologist: New  HPI: This is a 76 y.o. male with a past medical history significant for HTN, DM, but without known structural heart disease.   He presented yesterday after an episode of unexplained, unheralded syncope that occurred standing and walking a few steps. It was not preceded by dizziness or palpitations. He is not sure how long he had loss of consciousness and there was no witness to the syncopal event. He did not have any injury or loss of sphincter tone. He has never had syncope before.  He has a few other complaints that could be cardiac.   He has to climb 11 steps from his basement to his kitchen and become severely short of breath when he does that. This has been going on now for a few months.   He sometimes experiences chest pain but this is not consistently associated with any physical activity. Atypical and random location chest pain will happen at rest as well. A week ago he drove from [place] to [place] and had a sharp pleuritic-type discomfort behind his left shoulder blade for several hours. He has had some cough associated with nasal congestion and general upper respiratory infection type symptoms. He denies sputum production, hemoptysis, fever or chills. He denies  leg edema or tenderness, claudication, orthopnea, PND or any focal neurological symptoms.  He has been essentially asymptomatic since admission. Chest CT did not show pulmonary embolism but does show extensive atherosclerotic calcifications in the distribution of all 3 major coronary arteries.  PMHx:  Past Medical History:  Diagnosis Date  . Chronic airway obstruction ([place])   . Degeneration of intervertebral disc of lumbar region   . Diabetes mellitus without complication ([place])   . Essential hypertension, benign   . Hyperlipidemia   . Hypertension   . Osteoarthritis of lower extremity   . Pneumonia, organism  unspecified([place])   . PSA elevation 07/16/2013   Normal   Past Surgical History:  Procedure Laterality Date  . CATARACT EXTRACTION W/ INTRAOCULAR LENS  IMPLANT, BILATERAL    . COLONOSCOPY  2006   Normal: Repeat in 10 yrs    FAMHx: Family History  Problem Relation Age of Onset  . Diabetes Mother     SOCHx:  reports that he quit smoking about 58 years ago. He has never used smokeless tobacco. He reports that he does not drink alcohol or use drugs.  ALLERGIES: No Known Allergies  ROS: Pertinent items noted in HPI and remainder of comprehensive ROS otherwise negative.  HOME MEDICATIONS: Prescriptions Prior to Admission  Medication Sig Dispense Refill Last Dose  . albuterol (PROVENTIL HFA;VENTOLIN HFA) 108 (90 Base) MCG/ACT inhaler Inhale 2 puffs into the lungs 4 (four) times daily as needed for wheezing or shortness of breath.   12/19/2016 at Unknown time  . aspirin EC 81 MG tablet Take 81 mg by mouth daily.   12/19/2016 at am  . atorvastatin (LIPITOR) 80 MG tablet Take 40 mg by mouth at bedtime.   12/19/2016 at pm  . diclofenac sodium (VOLTAREN) 1 % GEL Apply 1 application topically daily as needed (shoulder pain).   week ago  . gabapentin (NEURONTIN) 300 MG capsule Take 300 mg by mouth at bedtime.   12/19/2016 at pm  . HYDROcodone-acetaminophen (NORCO/VICODIN) 5-325 MG tablet Take 1 tablet by mouth 2 (two) times daily as needed (pain).   few days ago  . insulin aspart (NOVOLOG FLEXPEN) 100 UNIT/ML FlexPen Inject  2-3 Units into the skin See admin instructions. Inject 2-3 units subcutaneously before supper as needed for CBG >200   4-5 days ago  . insulin glargine (LANTUS) 100 UNIT/ML injection Inject 0.7 mLs (70 Units total) into the skin 2 (two) times daily. 120 vial 1 12/20/2016 at am  . latanoprost (XALATAN) 0.005 % ophthalmic solution Place 1 drop into both eyes at bedtime.   12/19/2016 at pm  . lisinopril (PRINIVIL,ZESTRIL) 40 MG tablet Take 1 tablet (40 mg total) by mouth daily. 90  tablet 1 12/20/2016 at am  . Magnesium Oxide 420 MG TABS Take 420 mg by mouth daily.    12/20/2016 at am  . metFORMIN (GLUCOPHAGE) 1000 MG tablet Take 1 tablet (1,000 mg total) by mouth 2 (two) times daily with a meal. 180 tablet 1 12/20/2016 at am  . Multiple Vitamin (MULTIVITAMIN WITH MINERALS) TABS tablet Take 1 tablet by mouth daily.   few days ago  . polyvinyl alcohol (ARTIFICIAL TEARS) 1.4 % ophthalmic solution Place 2 drops into both eyes 5 (five) times daily as needed for dry eyes.   12/20/2016 at am  . prazosin (MINIPRESS) 2 MG capsule Take 2 mg by mouth See admin instructions. Take 1 capsule (2 mg) by mouth 2-3 times a week at bedtime   few nights ago  . tamsulosin (FLOMAX) 0.4 MG CAPS capsule Take 0.4 mg by mouth at bedtime.   12/19/2016 at pm  . tiotropium (SPIRIVA) 18 MCG inhalation capsule Place 18 mcg into inhaler and inhale 3 (three) times a week.   12/18/2016  . glipiZIDE (GLUCOTROL XL) 5 MG 24 hr tablet Take 1 tablet (5 mg total) by mouth daily with breakfast. (Patient not taking: Reported on 12/20/2016) 30 tablet 5 Not Taking at Unknown time  . glucose blood (PRECISION XTRA TEST STRIPS) test strip Use to test blood sugar 2 times daily as instructed. Dx: E11.65 400 each 3   . Insulin Pen Needle (CAREFINE PEN NEEDLES) 32G X 4 MM MISC Use 1x a day 100 each 2 Taking    HOSPITAL MEDICATIONS: I have reviewed the patient's current medications.  VITALS: Blood pressure 131/70, pulse (!) 58, temperature 98.4 F (36.9 C), temperature source Oral, resp. rate 18, height 6\' 1"  (1.854 m), weight 111.1 kg (245 lb), SpO2 97 %.  PHYSICAL EXAM:  General: Alert, oriented x3, no distress Head: no evidence of trauma, PERRL, EOMI, no exophtalmos or lid lag, no myxedema, no xanthelasma; normal ears, nose and oropharynx Neck: Normal jugular venous pulsations and no hepatojugular reflux; brisk carotid pulses without delay and no carotid bruits Chest: clear to auscultation, no signs of consolidation by  percussion or palpation, normal fremitus, symmetrical and full respiratory excursions Cardiovascular: normal position and quality of the apical impulse, regular rhythm, normal first heart sound and normal second heart sound, no rubs or gallops, no murmur Abdomen: no tenderness or distention, no masses by palpation, no abnormal pulsatility or arterial bruits, normal bowel sounds, no hepatosplenomegaly Extremities: no clubbing, cyanosis;  no edema; 2+ radial, ulnar and brachial pulses bilaterally; 2+ right femoral, posterior tibial and dorsalis pedis pulses; 2+ left femoral, posterior tibial and dorsalis pedis pulses; no subclavian or femoral bruits Neurological: grossly nonfocal   LABS  CBC  Recent Labs  12/20/16 1520  WBC 7.5  HGB 13.6  HCT 39.5  MCV 95.2  PLT 235   Basic Metabolic Panel  Recent Labs  12/20/16 1520  NA 138  K 4.0  CL 107  CO2 22  GLUCOSE  193*  BUN 17  CREATININE 0.95  CALCIUM 9.0  MG 1.5*   Liver Function Tests No results for input(s): AST, ALT, ALKPHOS, BILITOT, PROT, ALBUMIN in the last 72 hours. No results for input(s): LIPASE, AMYLASE in the last 72 hours. Cardiac Enzymes  Recent Labs  12/20/16 2117 12/21/16 0256  TROPONINI <0.03 <0.03   BNP Invalid input(s): POCBNP D-Dimer  Recent Labs  12/20/16 1520  DDIMER 0.75*   IMAGING: Dg Chest 2 View  Result Date: 12/20/2016 CLINICAL DATA:  Flu like illness for several days associated with cough and shortness of breath. Syncopal episode today. The patient also reports sharp shoulder pain and numbness in the left arm. EXAM: CHEST  2 VIEW COMPARISON:  None in PACs FINDINGS: The lungs are adequately inflated. The lung markings are coarse at both bases. There is no alveolar infiltrate or pleural effusion. The heart and pulmonary vascularity are normal. There is calcification in the wall of the thoracic aorta. The bony thorax exhibits no acute abnormality. Limited visualization of the left shoulder  reveals no bony abnormality. IMPRESSION: Coarse bibasilar lung markings may reflect atelectasis or early pneumonia. Followup PA and lateral chest X-ray is recommended in 3-4 weeks following trial of antibiotic therapy to ensure resolution and exclude underlying malignancy. Electronically Signed   By: David  Martinique M.D.   On: 12/20/2016 16:16   Ct Head Wo Contrast  Result Date: 12/20/2016 CLINICAL DATA:  76 year old diabetic hypertensive male with syncopal episode today. Not sure if hit head. Initial encounter. EXAM: CT HEAD WITHOUT CONTRAST TECHNIQUE: Contiguous axial images were obtained from the base of the skull through the vertex without intravenous contrast. COMPARISON:  None. FINDINGS: Brain: No intracranial hemorrhage or CT evidence of large acute infarct. No intracranial mass lesion noted on this unenhanced exam. Mild atrophy typical of age without hydrocephalus. Vascular: Vascular calcifications. Skull: No skull fracture. Sinuses/Orbits: Post lens replacement without acute orbital abnormality. Minimal mucosal thickening ethmoid sinus air cells. Other: Mastoid air cells and middle ear cavities are clear. Prominent cerumen on the left. IMPRESSION: No skull fracture or intracranial hemorrhage. No CT evidence of large acute infarct. Electronically Signed   By: Genia Del M.D.   On: 12/20/2016 18:30   Ct Angio Chest Pe W And/or Wo Contrast  Result Date: 12/20/2016 CLINICAL DATA:  Acute onset of syncope.  Initial encounter. EXAM: CT ANGIOGRAPHY CHEST WITH CONTRAST TECHNIQUE: Multidetector CT imaging of the chest was performed using the standard protocol during bolus administration of intravenous contrast. Multiplanar CT image reconstructions and MIPs were obtained to evaluate the vascular anatomy. CONTRAST:  80 mL of Isovue 370 IV contrast COMPARISON:  Chest radiograph performed earlier today at 4:08 p.m. FINDINGS: Cardiovascular:  There is no evidence of pulmonary embolus. The heart is borderline  normal in size. Diffuse coronary artery calcifications are seen. Scattered calcification is noted along the descending thoracic aorta and proximal great vessels. Mediastinum/Nodes: The mediastinum is otherwise unremarkable. No mediastinal lymphadenopathy is seen. No pericardial effusion is identified. The thyroid gland is unremarkable. No axial lymphadenopathy is seen. Lungs/Pleura: Minimal peripheral atelectasis is noted bilaterally. This is slightly nodular in appearance at the right lung apex. No pleural effusion or pneumothorax is seen. No dominant mass is identified. Upper Abdomen: The visualized portions of the liver and spleen are grossly unremarkable, aside from a small calcified granuloma at the right hepatic lobe. Stones are seen dependently within the gallbladder. The gallbladder is otherwise unremarkable. The visualized portions of the pancreas, adrenal glands and kidneys are  grossly unremarkable, aside from mild bilateral perinephric stranding. Musculoskeletal: No acute osseous abnormalities are identified. Anterolateral bridging osteophytes are seen along the thoracic spine. The visualized musculature is unremarkable in appearance. Review of the MIP images confirms the above findings. IMPRESSION: 1. No evidence of pulmonary embolus. 2. Minimal peripheral atelectasis noted bilaterally. Lungs otherwise clear. 3. Diffuse coronary artery calcifications seen. 4. Cholelithiasis.  Gallbladder otherwise unremarkable. Electronically Signed   By: Garald Balding M.D.   On: 12/20/2016 18:33    ECG: Ectopic atrial rhythm, otherwise normal  TELEMETRY:  alternating sinus and ectopic atrial rhythm  IMPRESSION: 1. Syncope: Consider alpha blocker related orthostatic hypotension versus arrhythmia 2. Exertional dyspnea 3. Atypical chest discomfort  RECOMMENDATION: 1. Echocardiogram 2. [place] 3. If left ventricular ejection fraction is abnormal, if there are distinct regional wall motion  abnormalities OR if there is reversible ischemia on his nuclear study would proceed to coronary angiography. 4. If there is no evidence of ischemic heart disease or cardiomyopathy, consider implantable loop recorder.  Time Spent Directly with Patient: 30 minutes  Sanda Klein, MD, [facility] [phone] office [phone] pager   12/21/2016, 9:14 AM

## 2016-12-22 ENCOUNTER — Telehealth: Payer: Self-pay | Admitting: *Deleted

## 2016-12-22 NOTE — Discharge Summary (Signed)
[facility] Discharge Summary   Patient: Mathew Baker HFW:[id number]   PCP: No PCP Per Patient DOB: 11/23/1940   Date of admission: 12/20/2016   Date of discharge: 12/21/2016    Discharge Diagnoses:  Principal Problem:   Syncope and collapse Active Problems:   Uncontrolled type 2 diabetes mellitus with microalbuminuria ([place])   Chest pain   HTN (hypertension)   HLD (hyperlipidemia)   Admitted From: home Disposition:  home  Recommendations for Outpatient Follow-up:  1. Please follow up with PCP in 1 week   College Croitoru, MD.   Specialty:  Cardiology Why:  The office will call you to arrange follow up apppointment wtih Dr. Sallyanne Kuster or one of his NP/PA Contact information: [address] [place] [address] [phone]        Schedule an appointment as soon as possible for a visit in 1 week to follow up.          Diet recommendation: cardiac diet  Activity: The patient is advised to gradually reintroduce usual activities.  Discharge Condition: good  Code Status: full code  History of present illness: As per the H and P dictated on admission, "Mathew Baker is a 76 y.o. gentleman with a history of HTN, HLD, and diabetes who is followed at the [place].  He presents to the ED tonight for evaluation after having an unwitnessed syncopal episode at home.  He reports a history of chest pain that occurs at rest and with exertion.  It is sharp and typically radiates into his back, under the right shoulder blade.  It has been associated with shortness of breath, diaphoresis, and light-headedness/dizziness.  Symptoms can last for several hours at a time.  He does not take anything in an attempt to relieve the symptoms when they occur.  Today, he had a syncopal episode in his home.  He denies preceding headache, chest pain, or palpitations.  He is not sure of how long he was down, but he was able to get himself up and ask his wife to bring  him to the ED.  He has had increased fatigue.  He has had cough and nasal congestion, and what he describes as "flu-like symptoms".  No fever.  No sputum production.  He has had intermittent swelling.  He has nocturia."  Hospital Course:  Summary of his active problems in the hospital is as following. Syncope with a history of chest pain and coronary calcifications on CTA chest.  PE ruled out.  Patient is concerned that "something is wrong" with his heart. --Arrhythmia high on the differential as cause for syncope; telemetry monitoring unremarkable. Will need loop recorder. Pt wants to discuss with PCP in [place].  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  Cough, congestion --Trial of mucinex, claritin  All other chronic medical condition were stable during the hospitalization.  Patient was ambulatory without any assistance. On the day of the discharge the patient's vitals were stable, and no other acute medical condition were reported by patient. the patient was felt safe to be discharge at home with family.  Procedures and Results:  Echocardiogram  Study Conclusions  - Left ventricle: The cavity  size was normal. There was mild   concentric hypertrophy. Systolic function was vigorous. The   estimated ejection fraction was in the range of 65% to 70%. Wall   motion was normal; there were no regional wall motion   abnormalities. Doppler parameters are consistent with abnormal   left ventricular relaxation (grade 1 diastolic dysfunction). - Aortic valve: Trileaflet; mildly thickened, mildly calcified   leaflets. - Left atrium: The atrium was mildly  dilated. Volume/bsa, S: 40.4   ml/m^2. - Right ventricle: The cavity size was mildly dilated. Wall   thickness was normal.   Stress test IMPRESSION: 1. There is fixed thinning of the inferior wall and attenuation artifact at the apex. No stress-induced ischemia.  2. Normal left ventricular wall motion.  3. Left ventricular ejection fraction 57%  4. Non invasive risk stratification*: Low risk.  Consultations:  Cardiology  DISCHARGE MEDICATION: Discharge Medication List as of 12/21/2016  6:10 PM    START taking these medications   Details  benzonatate (TESSALON PERLES) 100 MG capsule Take 1 capsule (100 mg total) by mouth 3 (three) times daily as needed for cough., Starting Tue 12/21/2016, Normal    guaiFENesin (MUCINEX) 600 MG 12 hr tablet Take 1 tablet (600 mg total) by mouth 2 (two) times daily., Starting Tue 12/21/2016, Normal      CONTINUE these medications which have CHANGED   Details  lisinopril (PRINIVIL,ZESTRIL) 30 MG tablet Take 1 tablet (30 mg total) by mouth daily., Starting Tue 12/21/2016, Normal      CONTINUE these medications which have NOT CHANGED   Details  albuterol (PROVENTIL HFA;VENTOLIN HFA) 108 (90 Base) MCG/ACT inhaler Inhale 2 puffs into the lungs 4 (four) times daily as needed for wheezing or shortness of breath., Historical Med    aspirin EC 81 MG tablet Take 81 mg by mouth daily., Historical Med    atorvastatin (LIPITOR) 80 MG tablet Take 40 mg by mouth at bedtime., Historical Med    diclofenac sodium (VOLTAREN) 1 % GEL Apply 1 application topically daily as needed (shoulder pain)., Historical Med    gabapentin (NEURONTIN) 300 MG capsule Take 300 mg by mouth at bedtime., Historical Med    HYDROcodone-acetaminophen (NORCO/VICODIN) 5-325 MG tablet Take 1 tablet by mouth 2 (two) times daily as needed (pain)., Historical Med    insulin aspart (NOVOLOG FLEXPEN) 100 UNIT/ML FlexPen Inject 2-3 Units into the skin See admin instructions. Inject 2-3  units subcutaneously before supper as needed for CBG >200, Historical Med    insulin glargine (LANTUS) 100 UNIT/ML injection Inject 0.7 mLs (70 Units total) into the skin 2 (two) times daily., Starting Fri 08/22/2015, Print    latanoprost (XALATAN) 0.005 % ophthalmic solution Place 1 drop into both eyes at bedtime., Historical Med    Magnesium Oxide 420 MG TABS Take 420 mg by mouth daily. , Historical Med    metFORMIN (GLUCOPHAGE) 1000 MG tablet Take 1 tablet (1,000 mg total) by mouth 2 (two) times daily with a meal., Starting Tue 03/25/2015, Print    Multiple Vitamin (MULTIVITAMIN WITH MINERALS) TABS tablet Take 1 tablet by mouth daily., Historical Med    polyvinyl alcohol (ARTIFICIAL TEARS) 1.4 % ophthalmic solution Place 2 drops into both eyes 5 (five) times daily as needed for dry eyes., Historical Med    tamsulosin (FLOMAX) 0.4 MG CAPS capsule Take 0.4 mg by mouth at bedtime., Historical Med    tiotropium (SPIRIVA) 18 MCG inhalation capsule Place 18 mcg into inhaler and inhale 3 (three) times a week.,  Historical Med    glipiZIDE (GLUCOTROL XL) 5 MG 24 hr tablet Take 1 tablet (5 mg total) by mouth daily with breakfast., Starting Fri 08/22/2015, Print    glucose blood (PRECISION XTRA TEST STRIPS) test strip Use to test blood sugar 2 times daily as instructed. Dx: E11.65, Print    Insulin Pen Needle (CAREFINE PEN NEEDLES) 32G X 4 MM MISC Use 1x a day, Print      STOP taking these medications     prazosin (MINIPRESS) 2 MG capsule        No Known Allergies Discharge Instructions    Diet - low sodium heart healthy    Complete by:  As directed    Discharge instructions    Complete by:  As directed    It is important that you read following instructions as well as go over your medication list with RN to help you understand your care after this hospitalization.  Discharge Instructions: Please follow-up with PCP in one week  Please request your primary care physician to go over  all Hospital Tests and Procedure/Radiological results at the follow up,  Please get all Hospital records sent to your PCP by signing hospital release before you go home.   Do not drive, operating heavy machinery, perform activities at heights, swimming or participation in water activities or provide baby sitting services; until you have been seen by Primary Care Physician or a Neurologist and advised to do so again. Do not take more than prescribed Pain, Sleep and Anxiety Medications. You were cared for by a hospitalist during your hospital stay. If you have any questions about your discharge medications or the care you received while you were in the hospital after you are discharged, you can call the unit and ask to speak with the hospitalist on call if the hospitalist that took care of you is not available.  Once you are discharged, your primary care physician will handle any further medical issues. Please note that NO REFILLS for any discharge medications will be authorized once you are discharged, as it is imperative that you return to your primary care physician (or establish a relationship with a primary care physician if you do not have one) for your aftercare needs so that they can reassess your need for medications and monitor your lab values. You Must read complete instructions/literature along with all the possible adverse reactions/side effects for all the Medicines you take and that have been prescribed to you. Take any new Medicines after you have completely understood and accept all the possible adverse reactions/side effects. Wear Seat belts while driving. If you have smoked or chewed Tobacco in the last 2 yrs please stop smoking and/or stop any Recreational drug use.   Driving Restrictions    Complete by:  As directed    Do not drive until cleared by cardiology or PCP.   Increase activity slowly    Complete by:  As directed      Discharge Exam: Filed Weights   12/20/16 2049  12/21/16 0457  Weight: 111.2 kg (245 lb 1.6 oz) 111.1 kg (245 lb)   Vitals:   12/21/16 1159 12/21/16 1700  BP: 139/74 131/71  Pulse: 70 71  Resp: 20   Temp: 97.9 F (36.6 C)    General: Appear in no distress, no Rash; Oral Mucosa moist. Cardiovascular: S1 and S2 Present, no Murmur, no JVD Respiratory: Bilateral Air entry present and Clear to Auscultation, no Crackles, no wheezes Abdomen: Bowel Sound present, Soft and  no tenderness Extremities: no Pedal edema, no calf tenderness Neurology: Grossly no focal neuro deficit.  The results of significant diagnostics from this hospitalization (including imaging, microbiology, ancillary and laboratory) are listed below for reference.    Significant Diagnostic Studies: Dg Chest 2 View  Result Date: 12/20/2016 CLINICAL DATA:  Flu like illness for several days associated with cough and shortness of breath. Syncopal episode today. The patient also reports sharp shoulder pain and numbness in the left arm. EXAM: CHEST  2 VIEW COMPARISON:  None in PACs FINDINGS: The lungs are adequately inflated. The lung markings are coarse at both bases. There is no alveolar infiltrate or pleural effusion. The heart and pulmonary vascularity are normal. There is calcification in the wall of the thoracic aorta. The bony thorax exhibits no acute abnormality. Limited visualization of the left shoulder reveals no bony abnormality. IMPRESSION: Coarse bibasilar lung markings may reflect atelectasis or early pneumonia. Followup PA and lateral chest X-ray is recommended in 3-4 weeks following trial of antibiotic therapy to ensure resolution and exclude underlying malignancy. Electronically Signed   By: David  Martinique M.D.   On: 12/20/2016 16:16   Ct Head Wo Contrast  Result Date: 12/20/2016 CLINICAL DATA:  76 year old diabetic hypertensive male with syncopal episode today. Not sure if hit head. Initial encounter. EXAM: CT HEAD WITHOUT CONTRAST TECHNIQUE: Contiguous axial images  were obtained from the base of the skull through the vertex without intravenous contrast. COMPARISON:  None. FINDINGS: Brain: No intracranial hemorrhage or CT evidence of large acute infarct. No intracranial mass lesion noted on this unenhanced exam. Mild atrophy typical of age without hydrocephalus. Vascular: Vascular calcifications. Skull: No skull fracture. Sinuses/Orbits: Post lens replacement without acute orbital abnormality. Minimal mucosal thickening ethmoid sinus air cells. Other: Mastoid air cells and middle ear cavities are clear. Prominent cerumen on the left. IMPRESSION: No skull fracture or intracranial hemorrhage. No CT evidence of large acute infarct. Electronically Signed   By: Genia Del M.D.   On: 12/20/2016 18:30   Ct Angio Chest Pe W And/or Wo Contrast  Result Date: 12/20/2016 CLINICAL DATA:  Acute onset of syncope.  Initial encounter. EXAM: CT ANGIOGRAPHY CHEST WITH CONTRAST TECHNIQUE: Multidetector CT imaging of the chest was performed using the standard protocol during bolus administration of intravenous contrast. Multiplanar CT image reconstructions and MIPs were obtained to evaluate the vascular anatomy. CONTRAST:  80 mL of Isovue 370 IV contrast COMPARISON:  Chest radiograph performed earlier today at 4:08 p.m. FINDINGS: Cardiovascular:  There is no evidence of pulmonary embolus. The heart is borderline normal in size. Diffuse coronary artery calcifications are seen. Scattered calcification is noted along the descending thoracic aorta and proximal great vessels. Mediastinum/Nodes: The mediastinum is otherwise unremarkable. No mediastinal lymphadenopathy is seen. No pericardial effusion is identified. The thyroid gland is unremarkable. No axial lymphadenopathy is seen. Lungs/Pleura: Minimal peripheral atelectasis is noted bilaterally. This is slightly nodular in appearance at the right lung apex. No pleural effusion or pneumothorax is seen. No dominant mass is identified. Upper  Abdomen: The visualized portions of the liver and spleen are grossly unremarkable, aside from a small calcified granuloma at the right hepatic lobe. Stones are seen dependently within the gallbladder. The gallbladder is otherwise unremarkable. The visualized portions of the pancreas, adrenal glands and kidneys are grossly unremarkable, aside from mild bilateral perinephric stranding. Musculoskeletal: No acute osseous abnormalities are identified. Anterolateral bridging osteophytes are seen along the thoracic spine. The visualized musculature is unremarkable in appearance. Review of the MIP images  confirms the above findings. IMPRESSION: 1. No evidence of pulmonary embolus. 2. Minimal peripheral atelectasis noted bilaterally. Lungs otherwise clear. 3. Diffuse coronary artery calcifications seen. 4. Cholelithiasis.  Gallbladder otherwise unremarkable. Electronically Signed   By: Garald Balding M.D.   On: 12/20/2016 18:33   Nm Myocar Multi W/spect W/wall Motion / Ef  Result Date: 12/21/2016 CLINICAL DATA:  Chest pain EXAM: MYOCARDIAL IMAGING WITH SPECT (REST AND PHARMACOLOGIC-STRESS) GATED LEFT VENTRICULAR WALL MOTION STUDY LEFT VENTRICULAR EJECTION FRACTION TECHNIQUE: Standard myocardial SPECT imaging was performed after resting intravenous injection of 10 mCi Tc-66m tetrofosmin. Subsequently, intravenous infusion of Lexiscan was performed under the supervision of the Cardiology staff. At peak effect of the drug, 30 mCi Tc-8m tetrofosmin was injected intravenously and standard myocardial SPECT imaging was performed. Quantitative gated imaging was also performed to evaluate left ventricular wall motion, and estimate left ventricular ejection fraction. COMPARISON:  None. FINDINGS: Perfusion: There is fixed thinning of the inferior wall. Attenuation artifact is present at the apex. No stress-induced ischemia. Wall Motion: Normal left ventricular wall motion. No left ventricular dilation. Left Ventricular Ejection  Fraction: 57 % End diastolic volume 628 ml End systolic volume 45 ml IMPRESSION: 1. There is fixed thinning of the inferior wall and attenuation artifact at the apex. No stress-induced ischemia. 2. Normal left ventricular wall motion. 3. Left ventricular ejection fraction 57% 4. Non invasive risk stratification*: Low risk. *2012 Appropriate Use Criteria for Coronary Revascularization Focused Update: J Am Coll Cardiol. 6381;77(1):165-790. http://content.airportbarriers.com.aspx?articleid=[email or web address] Electronically Signed   By: Marybelle Killings M.D.   On: 12/21/2016 13:58    Microbiology: No results found for this or any previous visit (from the past 240 hour(s)).   Labs: CBC:  Recent Labs Lab 12/20/16 1520 12/21/16 0904  WBC 7.5 5.8  HGB 13.6 12.4*  HCT 39.5 37.2*  MCV 95.2 96.6  PLT 196 383   Basic Metabolic Panel:  Recent Labs Lab 12/20/16 1520 12/21/16 0904  NA 138 140  K 4.0 4.2  CL 107 108  CO2 22 26  GLUCOSE 193* 209*  BUN 17 17  CREATININE 0.95 0.85  CALCIUM 9.0 8.4*  MG 1.5*  --    Liver Function Tests: No results for input(s): AST, ALT, ALKPHOS, BILITOT, PROT, ALBUMIN in the last 168 hours. No results for input(s): LIPASE, AMYLASE in the last 168 hours. No results for input(s): AMMONIA in the last 168 hours. Cardiac Enzymes:  Recent Labs Lab 12/20/16 2117 12/21/16 0256 12/21/16 0904  TROPONINI <0.03 <0.03 <0.03   BNP (last 3 results) No results for input(s): BNP in the last 8760 hours. CBG:  Recent Labs Lab 12/20/16 2057 12/21/16 0737 12/21/16 1157 12/21/16 1617  GLUCAP 239* 209* 214* 200*   Time spent: 35 minutes  Signed:  Khyrin Trevathan  [facility] 12/21/2016 , 11:10 PM

## 2016-12-22 NOTE — Telephone Encounter (Signed)
Sent msg to scheduling to arrange.

## 2016-12-22 NOTE — Telephone Encounter (Signed)
Eileen Stanford, PA-C  P [facility] Triage        Needs follow up with Dr. Sallyanne Kuster or one of his APPS in 2 weeks and needs a benefits check to see if Loop recorder will be covered. Do you know who can help do that?

## 2017-01-07 ENCOUNTER — Ambulatory Visit: Payer: Medicare Other | Admitting: Cardiology

## 2017-01-19 ENCOUNTER — Ambulatory Visit: Payer: Medicare Other | Admitting: Cardiology

## 2019-03-19 ENCOUNTER — Encounter: Payer: Self-pay | Admitting: Emergency Medicine

## 2019-03-19 ENCOUNTER — Other Ambulatory Visit: Payer: Self-pay

## 2019-03-19 ENCOUNTER — Inpatient Hospital Stay
Admission: EM | Admit: 2019-03-19 | Discharge: 2019-03-27 | DRG: 064 | Disposition: A | Discharge status: Home Health | Payer: Medicare Other | Attending: Internal Medicine | Admitting: Internal Medicine

## 2019-03-19 DIAGNOSIS — Z23 Encounter for immunization: Secondary | ICD-10-CM | POA: Diagnosis not present

## 2019-03-19 DIAGNOSIS — E119 Type 2 diabetes mellitus without complications: Secondary | ICD-10-CM | POA: Diagnosis not present

## 2019-03-19 DIAGNOSIS — R001 Bradycardia, unspecified: Secondary | ICD-10-CM | POA: Diagnosis present

## 2019-03-19 DIAGNOSIS — Z20828 Contact with and (suspected) exposure to other viral communicable diseases: Secondary | ICD-10-CM | POA: Diagnosis present

## 2019-03-19 DIAGNOSIS — Z9181 History of falling: Secondary | ICD-10-CM

## 2019-03-19 DIAGNOSIS — I499 Cardiac arrhythmia, unspecified: Secondary | ICD-10-CM | POA: Diagnosis not present

## 2019-03-19 DIAGNOSIS — R110 Nausea: Secondary | ICD-10-CM | POA: Diagnosis not present

## 2019-03-19 DIAGNOSIS — I495 Sick sinus syndrome: Secondary | ICD-10-CM | POA: Diagnosis not present

## 2019-03-19 DIAGNOSIS — Z794 Long term (current) use of insulin: Secondary | ICD-10-CM

## 2019-03-19 DIAGNOSIS — I6389 Other cerebral infarction: Secondary | ICD-10-CM | POA: Diagnosis not present

## 2019-03-19 DIAGNOSIS — J449 Chronic obstructive pulmonary disease, unspecified: Secondary | ICD-10-CM | POA: Diagnosis present

## 2019-03-19 DIAGNOSIS — Z7982 Long term (current) use of aspirin: Secondary | ICD-10-CM

## 2019-03-19 DIAGNOSIS — R29700 NIHSS score 0: Secondary | ICD-10-CM | POA: Diagnosis not present

## 2019-03-19 DIAGNOSIS — E785 Hyperlipidemia, unspecified: Secondary | ICD-10-CM | POA: Diagnosis present

## 2019-03-19 DIAGNOSIS — Z03818 Encounter for observation for suspected exposure to other biological agents ruled out: Secondary | ICD-10-CM | POA: Diagnosis not present

## 2019-03-19 DIAGNOSIS — I10 Essential (primary) hypertension: Secondary | ICD-10-CM | POA: Diagnosis not present

## 2019-03-19 DIAGNOSIS — K5900 Constipation, unspecified: Secondary | ICD-10-CM | POA: Diagnosis present

## 2019-03-19 DIAGNOSIS — R112 Nausea with vomiting, unspecified: Secondary | ICD-10-CM

## 2019-03-19 DIAGNOSIS — R55 Syncope and collapse: Secondary | ICD-10-CM | POA: Diagnosis present

## 2019-03-19 DIAGNOSIS — G936 Cerebral edema: Secondary | ICD-10-CM | POA: Diagnosis present

## 2019-03-19 DIAGNOSIS — E1165 Type 2 diabetes mellitus with hyperglycemia: Secondary | ICD-10-CM | POA: Diagnosis not present

## 2019-03-19 DIAGNOSIS — H409 Unspecified glaucoma: Secondary | ICD-10-CM | POA: Diagnosis present

## 2019-03-19 DIAGNOSIS — Z833 Family history of diabetes mellitus: Secondary | ICD-10-CM

## 2019-03-19 DIAGNOSIS — Z87891 Personal history of nicotine dependence: Secondary | ICD-10-CM

## 2019-03-19 DIAGNOSIS — M5136 Other intervertebral disc degeneration, lumbar region: Secondary | ICD-10-CM | POA: Diagnosis present

## 2019-03-19 DIAGNOSIS — R42 Dizziness and giddiness: Secondary | ICD-10-CM

## 2019-03-19 DIAGNOSIS — K2900 Acute gastritis without bleeding: Secondary | ICD-10-CM | POA: Diagnosis present

## 2019-03-19 DIAGNOSIS — I358 Other nonrheumatic aortic valve disorders: Secondary | ICD-10-CM | POA: Diagnosis present

## 2019-03-19 LAB — CBC WITH DIFFERENTIAL/PLATELET
Abs Immature Granulocytes: 0.09 10*3/uL — ABNORMAL HIGH (ref 0.00–0.07)
Basophils Absolute: 0 10*3/uL (ref 0.0–0.1)
Basophils Relative: 0 %
Eosinophils Absolute: 0.2 10*3/uL (ref 0.0–0.5)
Eosinophils Relative: 2 %
HCT: 42.6 % (ref 39.0–52.0)
Hemoglobin: 14.6 g/dL (ref 13.0–17.0)
Immature Granulocytes: 1 %
Lymphocytes Relative: 14 %
Lymphs Abs: 1.6 10*3/uL (ref 0.7–4.0)
MCH: 33 pg (ref 26.0–34.0)
MCHC: 34.3 g/dL (ref 30.0–36.0)
MCV: 96.4 fL (ref 80.0–100.0)
Monocytes Absolute: 0.8 10*3/uL (ref 0.1–1.0)
Monocytes Relative: 7 %
Neutro Abs: 9.1 10*3/uL — ABNORMAL HIGH (ref 1.7–7.7)
Neutrophils Relative %: 76 %
Platelets: 214 10*3/uL (ref 150–400)
RBC: 4.42 MIL/uL (ref 4.22–5.81)
RDW: 12.5 % (ref 11.5–15.5)
WBC: 11.8 10*3/uL — ABNORMAL HIGH (ref 4.0–10.5)
nRBC: 0 % (ref 0.0–0.2)

## 2019-03-19 LAB — GLUCOSE, CAPILLARY
Glucose-Capillary: 275 mg/dL — ABNORMAL HIGH (ref 70–99)
Glucose-Capillary: 298 mg/dL — ABNORMAL HIGH (ref 70–99)
Glucose-Capillary: 220 mg/dL — ABNORMAL HIGH (ref 70–99)

## 2019-03-19 LAB — COMPREHENSIVE METABOLIC PANEL
ALT: 21 U/L (ref 0–44)
AST: 22 U/L (ref 15–41)
Albumin: 4 g/dL (ref 3.5–5.0)
Alkaline Phosphatase: 103 U/L (ref 38–126)
Anion gap: 10 (ref 5–15)
BUN: 15 mg/dL (ref 8–23)
CO2: 24 mmol/L (ref 22–32)
Calcium: 9 mg/dL (ref 8.9–10.3)
Chloride: 102 mmol/L (ref 98–111)
Creatinine, Ser: 0.79 mg/dL (ref 0.61–1.24)
GFR calc Af Amer: >60 mL/min (ref >60)
GFR calc non Af Amer: >60 mL/min (ref >60)
Glucose, Bld: 339 mg/dL — ABNORMAL HIGH (ref 70–99)
Potassium: 3.8 mmol/L (ref 3.5–5.1)
Sodium: 136 mmol/L (ref 135–145)
Total Bilirubin: 1 mg/dL (ref 0.3–1.2)
Total Protein: 7.5 g/dL (ref 6.5–8.1)

## 2019-03-19 LAB — PROTIME-INR
INR: 0.9 (ref 0.8–1.2)
Prothrombin Time: 12.5 seconds (ref 11.4–15.2)

## 2019-03-19 LAB — MAGNESIUM: Magnesium: 1.8 mg/dL (ref 1.7–2.4)

## 2019-03-19 LAB — TSH
TSH: 3.477 u[IU]/mL (ref 0.350–4.500)
TSH: 2.453 u[IU]/mL (ref 0.350–4.500)

## 2019-03-19 LAB — LACTIC ACID, PLASMA
Lactic Acid, Venous: 2.3 mmol/L (ref 0.5–1.9)
Lactic Acid, Venous: 1.5 mmol/L (ref 0.5–1.9)

## 2019-03-19 LAB — SARS CORONAVIRUS 2 BY RT PCR (HOSPITAL ORDER, PERFORMED IN ~~LOC~~ HOSPITAL LAB): SARS Coronavirus 2: NEGATIVE

## 2019-03-19 LAB — TROPONIN I (HIGH SENSITIVITY)
Troponin I (High Sensitivity): 10 ng/L (ref <18)
Troponin I (High Sensitivity): 10 ng/L (ref <18)

## 2019-03-19 LAB — HEMOGLOBIN A1C
Hgb A1c MFr Bld: 9 % — ABNORMAL HIGH (ref 4.8–5.6)
Mean Plasma Glucose: 211.6 mg/dL

## 2019-03-19 LAB — LIPASE, BLOOD: Lipase: 40 U/L (ref 11–51)

## 2019-03-19 MED ORDER — ACETAMINOPHEN 650 MG RE SUPP: 650.0000 mg | Freq: Four times a day (QID) | RECTAL | Status: DC | PRN

## 2019-03-19 MED ORDER — LISINOPRIL 10 MG PO TABS
30.0000 mg | ORAL_TABLET | Freq: Every day | ORAL | Status: DC
  Administered 2019-03-19 – 2019-03-21 (×3): 30 mg via ORAL
  Filled 2019-03-19 (×3): qty 3

## 2019-03-19 MED ORDER — INSULIN ASPART 100 UNIT/ML ~~LOC~~ SOLN
0.0000 [IU] | Freq: Three times a day (TID) | SUBCUTANEOUS | Status: DC
  Administered 2019-03-19: 5 [IU] via SUBCUTANEOUS
  Administered 2019-03-20: 2 [IU] via SUBCUTANEOUS
  Administered 2019-03-20: 3 [IU] via SUBCUTANEOUS
  Administered 2019-03-20 – 2019-03-22 (×3): 2 [IU] via SUBCUTANEOUS
  Administered 2019-03-22: 3 [IU] via SUBCUTANEOUS
  Administered 2019-03-22: 1 [IU] via SUBCUTANEOUS
  Administered 2019-03-23: 17:00:00 2 [IU] via SUBCUTANEOUS
  Administered 2019-03-23: 1 [IU] via SUBCUTANEOUS
  Administered 2019-03-23: 2 [IU] via SUBCUTANEOUS
  Administered 2019-03-24: 12:00:00 3 [IU] via SUBCUTANEOUS
  Administered 2019-03-24: 2 [IU] via SUBCUTANEOUS
  Administered 2019-03-24: 5 [IU] via SUBCUTANEOUS
  Administered 2019-03-25: 12:00:00 3 [IU] via SUBCUTANEOUS
  Administered 2019-03-25 – 2019-03-26 (×4): 2 [IU] via SUBCUTANEOUS
  Administered 2019-03-26: 3 [IU] via SUBCUTANEOUS
  Administered 2019-03-27: 2 [IU] via SUBCUTANEOUS
  Administered 2019-03-27: 5 [IU] via SUBCUTANEOUS
  Filled 2019-03-19 (×21): qty 1

## 2019-03-19 MED ORDER — BENZONATATE 100 MG PO CAPS: 100.0000 mg | ORAL_CAPSULE | Freq: Three times a day (TID) | ORAL | Status: DC | PRN

## 2019-03-19 MED ORDER — ACETAMINOPHEN 325 MG PO TABS
650.0000 mg | ORAL_TABLET | Freq: Four times a day (QID) | ORAL | Status: DC | PRN
  Administered 2019-03-20 – 2019-03-22 (×4): 650 mg via ORAL
  Filled 2019-03-19 (×4): qty 2

## 2019-03-19 MED ORDER — TIOTROPIUM BROMIDE MONOHYDRATE 18 MCG IN CAPS
18.0000 ug | ORAL_CAPSULE | RESPIRATORY_TRACT | Status: DC
  Administered 2019-03-19 – 2019-03-26 (×5): 18 ug via RESPIRATORY_TRACT
  Filled 2019-03-19 (×2): qty 5

## 2019-03-19 MED ORDER — ALBUTEROL SULFATE (2.5 MG/3ML) 0.083% IN NEBU: 2.5000 mg | INHALATION_SOLUTION | RESPIRATORY_TRACT | Status: DC | PRN

## 2019-03-19 MED ORDER — INSULIN ASPART 100 UNIT/ML ~~LOC~~ SOLN
0.0000 [IU] | Freq: Every day | SUBCUTANEOUS | Status: DC
  Administered 2019-03-19 – 2019-03-26 (×4): 2 [IU] via SUBCUTANEOUS
  Filled 2019-03-19 (×4): qty 1

## 2019-03-19 MED ORDER — LATANOPROST 0.005 % OP SOLN
1.0000 [drp] | Freq: Every day | OPHTHALMIC | Status: DC
  Administered 2019-03-19 – 2019-03-26 (×7): 1 [drp] via OPHTHALMIC
  Filled 2019-03-19: qty 2.5

## 2019-03-19 MED ORDER — ENOXAPARIN SODIUM 40 MG/0.4ML ~~LOC~~ SOLN
40.0000 mg | SUBCUTANEOUS | Status: DC
  Administered 2019-03-19 – 2019-03-21 (×3): 40 mg via SUBCUTANEOUS
  Filled 2019-03-19 (×3): qty 0.4

## 2019-03-19 MED ORDER — HYDROCODONE-ACETAMINOPHEN 5-325 MG PO TABS: 1.0000 | ORAL_TABLET | Freq: Two times a day (BID) | ORAL | Status: DC | PRN

## 2019-03-19 MED ORDER — TAMSULOSIN HCL 0.4 MG PO CAPS
0.4000 mg | ORAL_CAPSULE | Freq: Every day | ORAL | Status: DC
  Administered 2019-03-19 – 2019-03-26 (×8): 0.4 mg via ORAL
  Filled 2019-03-19 (×8): qty 1

## 2019-03-19 MED ORDER — SODIUM CHLORIDE 0.9 % IV SOLN
INTRAVENOUS | Status: DC
  Administered 2019-03-19 – 2019-03-20 (×2): via INTRAVENOUS
  Administered 2019-03-20: 800 mL via INTRAVENOUS
  Administered 2019-03-21 – 2019-03-22 (×4): via INTRAVENOUS

## 2019-03-19 MED ORDER — ONDANSETRON HCL 4 MG/2ML IJ SOLN
4.0000 mg | Freq: Four times a day (QID) | INTRAMUSCULAR | Status: DC | PRN
  Administered 2019-03-19 – 2019-03-22 (×5): 4 mg via INTRAVENOUS
  Filled 2019-03-19 (×6): qty 2

## 2019-03-19 MED ORDER — INSULIN GLARGINE 100 UNIT/ML ~~LOC~~ SOLN
70.0000 [IU] | Freq: Two times a day (BID) | SUBCUTANEOUS | Status: DC
  Administered 2019-03-20: 08:00:00 70 [IU] via SUBCUTANEOUS
  Filled 2019-03-19 (×4): qty 0.7

## 2019-03-19 MED ORDER — ALBUTEROL SULFATE HFA 108 (90 BASE) MCG/ACT IN AERS: 2.0000 | INHALATION_SPRAY | Freq: Four times a day (QID) | RESPIRATORY_TRACT | Status: DC | PRN

## 2019-03-19 MED ORDER — DICLOFENAC SODIUM 1 % TD GEL
1.0000 "application " | Freq: Every day | TRANSDERMAL | Status: DC | PRN
  Filled 2019-03-19: qty 100

## 2019-03-19 MED ORDER — POLYVINYL ALCOHOL 1.4 % OP SOLN
2.0000 [drp] | Freq: Every day | OPHTHALMIC | Status: DC | PRN
  Filled 2019-03-19: qty 15

## 2019-03-19 MED ORDER — PNEUMOCOCCAL VAC POLYVALENT 25 MCG/0.5ML IJ INJ
0.5000 mL | INJECTION | INTRAMUSCULAR | Status: AC
  Administered 2019-03-20: 0.5 mL via INTRAMUSCULAR
  Filled 2019-03-19: qty 0.5

## 2019-03-19 MED ORDER — GUAIFENESIN ER 600 MG PO TB12
600.0000 mg | ORAL_TABLET | Freq: Two times a day (BID) | ORAL | Status: DC
  Administered 2019-03-19 – 2019-03-27 (×16): 600 mg via ORAL
  Filled 2019-03-19 (×17): qty 1

## 2019-03-19 MED ORDER — GABAPENTIN 300 MG PO CAPS
300.0000 mg | ORAL_CAPSULE | Freq: Every day | ORAL | Status: DC
  Administered 2019-03-19 – 2019-03-26 (×8): 300 mg via ORAL
  Filled 2019-03-19 (×8): qty 1

## 2019-03-19 MED ORDER — ONDANSETRON HCL 4 MG PO TABS: 4.0000 mg | ORAL_TABLET | Freq: Four times a day (QID) | ORAL | Status: DC | PRN

## 2019-03-19 MED ORDER — ATORVASTATIN CALCIUM 20 MG PO TABS
40.0000 mg | ORAL_TABLET | Freq: Every day | ORAL | Status: DC
  Administered 2019-03-19 – 2019-03-26 (×8): 40 mg via ORAL
  Filled 2019-03-19 (×8): qty 2

## 2019-03-19 MED ORDER — ASPIRIN EC 81 MG PO TBEC
81.0000 mg | DELAYED_RELEASE_TABLET | Freq: Every day | ORAL | Status: DC
  Administered 2019-03-19 – 2019-03-21 (×3): 81 mg via ORAL
  Filled 2019-03-19 (×3): qty 1

## 2019-03-19 MED ORDER — SODIUM CHLORIDE 0.9 % IV BOLUS
1000.0000 mL | Freq: Once | INTRAVENOUS | Status: AC
  Administered 2019-03-19: 11:00:00 1000 mL via INTRAVENOUS

## 2019-03-19 NOTE — Care Management (Signed)
MOON explained and delivered to patient. He is independent from home with his wife. His PCP is with [facility] [phone] R. Doristine Church [phone]) fax. Patient is observation status. Please fax discharge information when available and [facility] form if changed to inpatient.

## 2019-03-19 NOTE — Consult Note (Signed)
[facility] Cardiology  CARDIOLOGY CONSULT NOTE  Patient ID: Mathew Baker MRN: [id number] DOB/AGE: November 08, 1940 78 y.o.  Admit date: 03/19/2019 Referring Physician Dr. Dustin Flock  Primary Physician N/A Primary Cardiologist N/A Reason for Consultation Bradycardia   HPI: Mathew Baker is a 78 year old male with a past medical history significant for type 2 diabetes, COPD, hypertension, and hyperlipidemia who presented to the ED on 03/19/19 for a 3 week history of intermittent dizziness and near syncopal episodes as well as a one day history of nausea/vomiting.  Workup in the ED was significant for borderline elevated WBC count and an elevated lactic acid, 2.3.  COVID negative.  High sensitivity troponin negative. In the ED, he was noted to be bradycardic with rates in the 30s, so he was admitted for further workup.    He has no known cardiac history to report. Stress test in 2018  Revealed normal LV function with an EF estimated at 57% with no evidence of stress-induced ischemia.   Review of systems complete and found to be negative unless listed above     Past Medical History:  Diagnosis Date  . Chronic airway obstruction ([place])   . Degeneration of intervertebral disc of lumbar region   . Diabetes mellitus without complication ([place])   . Essential hypertension, benign   . Hyperlipidemia   . Hypertension   . Osteoarthritis of lower extremity   . Pneumonia, organism unspecified([place])   . PSA elevation 07/16/2013   Normal    Past Surgical History:  Procedure Laterality Date  . CATARACT EXTRACTION W/ INTRAOCULAR LENS  IMPLANT, BILATERAL    . COLONOSCOPY  2006   Normal: Repeat in 10 yrs    Medications Prior to Admission  Medication Sig Dispense Refill Last Dose  . albuterol (PROVENTIL HFA;VENTOLIN HFA) 108 (90 Base) MCG/ACT inhaler Inhale 2 puffs into the lungs 4 (four) times daily as needed for wheezing or shortness of breath.   Unknown at PRN  . amLODipine (NORVASC) 10 MG tablet Take 10 mg by  mouth daily.   Past Week at Unknown time  . atorvastatin (LIPITOR) 80 MG tablet Take 40 mg by mouth at bedtime.   Past Week at Unknown time  . Dulaglutide (TRULICITY) 1.5 QZ/3.0QT SOPN Inject 1.5 mg into the skin every 7 (seven) days.   As directed at As directed  . glucose blood (PRECISION XTRA TEST STRIPS) test strip Use to test blood sugar 2 times daily as instructed. Dx: E11.65 400 each 3   . HYDROcodone-acetaminophen (NORCO/VICODIN) 5-325 MG tablet Take 1 tablet by mouth 2 (two) times a day.    Past Week at Unknown time  . insulin glargine (LANTUS) 100 UNIT/ML injection Inject 0.7 mLs (70 Units total) into the skin 2 (two) times daily. 120 vial 1 03/19/2019 at 0800  . Insulin Pen Needle (CAREFINE PEN NEEDLES) 32G X 4 MM MISC Use 1x a day 100 each 2   . latanoprost (XALATAN) 0.005 % ophthalmic solution Place 1 drop into both eyes at bedtime.   Past Week at Unknown time  . lisinopril (ZESTRIL) 40 MG tablet Take 40 mg by mouth daily.   Past Week at Unknown time  . Magnesium Oxide 420 MG TABS Take 420 mg by mouth daily.    Past Week at Unknown time  . metFORMIN (GLUCOPHAGE) 1000 MG tablet Take 1 tablet (1,000 mg total) by mouth 2 (two) times daily with a meal. 180 tablet 1 Past Week at Unknown time  . sildenafil (VIAGRA) 100 MG  tablet Take 100 mg by mouth daily as needed for erectile dysfunction.   Unknown at PRN  . tamsulosin (FLOMAX) 0.4 MG CAPS capsule Take 0.4 mg by mouth at bedtime.   Past Week at Unknown time  . tiotropium (SPIRIVA) 18 MCG inhalation capsule Place 18 mcg into inhaler and inhale daily.    Past Week at Unknown time  . glipiZIDE (GLUCOTROL XL) 5 MG 24 hr tablet Take 1 tablet (5 mg total) by mouth daily with breakfast. (Patient not taking: Reported on 12/20/2016) 30 tablet 5 Not Taking at Unknown time  . guaiFENesin (MUCINEX) 600 MG 12 hr tablet Take 1 tablet (600 mg total) by mouth 2 (two) times daily. (Patient not taking: Reported on 03/19/2019) 30 tablet 0 Not Taking at Unknown  time  . lisinopril (PRINIVIL,ZESTRIL) 30 MG tablet Take 1 tablet (30 mg total) by mouth daily. (Patient not taking: Reported on 03/19/2019) 30 tablet 0 Not Taking at Unknown time   Social History   Socioeconomic History  . Marital status: Married    Spouse name: Not on file  . Number of children: Not on file  . Years of education: Not on file  . Highest education level: Not on file  Occupational History  . Not on file  Social Needs  . Financial resource strain: Not on file  . Food insecurity    Worry: Not on file    Inability: Not on file  . Transportation needs    Medical: Not on file    Non-medical: Not on file  Tobacco Use  . Smoking status: Former Smoker    Quit date: 09/06/1958    Years since quitting: 60.5  . Smokeless tobacco: Never Used  Substance and Sexual Activity  . Alcohol use: No    Alcohol/week: 0.0 standard drinks  . Drug use: No  . Sexual activity: Not on file  Lifestyle  . Physical activity    Days per week: Not on file    Minutes per session: Not on file  . Stress: Not on file  Relationships  . Social Herbalist on phone: Not on file    Gets together: Not on file    Attends religious service: Not on file    Active member of club or organization: Not on file    Attends meetings of clubs or organizations: Not on file    Relationship status: Not on file  . Intimate partner violence    Fear of current or ex partner: Not on file    Emotionally abused: Not on file    Physically abused: Not on file    Forced sexual activity: Not on file  Other Topics Concern  . Not on file  Social History Narrative   Married   Retired; [employer] 31 yrs; One of the original [employer] in the early 60's   1 daughter    Family History  Problem Relation Age of Onset  . Diabetes Mother       Review of systems complete and found to be negative unless listed above      PHYSICAL EXAM  General: Well developed, well nourished, in no acute distress HEENT:   Normocephalic and atramatic Neck:  No JVD.  Lungs: Clear bilaterally to auscultation and percussion. Heart: Bradycardic. 3/6 systolic murmur appreciated. No gallops or rubs Abdomen: Bowel sounds are positive, abdomen soft and non-tender  Msk:  Back normal, normal gait. Normal strength and tone for age. Extremities: No clubbing, cyanosis or edema.  Neuro: Alert and oriented X 3. Psych:  Good affect, responds appropriately  Labs:   Lab Results  Component Value Date   WBC 11.8 (H) 03/19/2019   HGB 14.6 03/19/2019   HCT 42.6 03/19/2019   MCV 96.4 03/19/2019   PLT 214 03/19/2019    Recent Labs  Lab 03/19/19 1108  NA 136  K 3.8  CL 102  CO2 24  BUN 15  CREATININE 0.79  CALCIUM 9.0  PROT 7.5  BILITOT 1.0  ALKPHOS 103  ALT 21  AST 22  GLUCOSE 339*   Lab Results  Component Value Date   TROPONINI <0.03 12/21/2016    Lab Results  Component Value Date   CHOL 104 08/22/2015   CHOL 90 06/03/2014   Lab Results  Component Value Date   HDL 34.70 (L) 08/22/2015   HDL 39 06/03/2014   Lab Results  Component Value Date   LDLCALC 24 06/03/2014   Lab Results  Component Value Date   TRIG 216.0 (H) 08/22/2015   TRIG 133 06/03/2014   Lab Results  Component Value Date   CHOLHDL 3 08/22/2015   Lab Results  Component Value Date   LDLDIRECT 28.0 08/22/2015      Radiology: No results found.  EKG: Sinus bradycardia, rate fluctuating in the 40s, 50s  ASSESSMENT AND PLAN:  1.  Symptomatic bradycardia/dizziness   -Will monitor overnight; echocardiogram pending   -May consider carotid ultrasound, head CT for dizziness pending echocardiogram results  2.  Type 2 diabetes  -Continue sliding scale insulin  3.  Hypertension   -Continue lisinopril  4.  Hyperlipidemia   -Continue atorvastatin   The history, physical exam findings, and plan of care were all discussed with Dr. Bartholome Bill, and all decision making was made in collaboration.   Signed: [name]  PA-C 03/19/2019, 4:17 PM

## 2019-03-19 NOTE — ED Provider Notes (Signed)
[facility] Emergency Department Provider Note  ____________________________________________  Time seen: Approximately 12:01 PM  I have reviewed the triage vital signs and the nursing notes.   HISTORY  Chief Complaint Emesis and Nausea    HPI Mathew Baker is a 78 y.o. male with a history of diabetes, hypertension, hyperlipidemia who comes to the ED complaining of nausea and vomiting this morning since waking up.  He was in his usual state of health last night and ate a normal dinner.  He has had contact with COVID positive people but denies chest pain shortness of breath belly pain body aches fevers or chills.  Symptoms are constant since this morning.  Worse standing up which also caused him to sit to get dizziness and near syncopal.  Better lying down.  No radiating pain.  No chest pain.  Patient denies any heart history, takes no AV nodal blockers.  Does not have a cardiologist.      Past Medical History:  Diagnosis Date  . Chronic airway obstruction ([place])   . Degeneration of intervertebral disc of lumbar region   . Diabetes mellitus without complication ([place])   . Essential hypertension, benign   . Hyperlipidemia   . Hypertension   . Osteoarthritis of lower extremity   . Pneumonia, organism unspecified([place])   . PSA elevation 07/16/2013   Normal     Patient Active Problem List   Diagnosis Date Noted  . Chest pain 12/20/2016  . HTN (hypertension) 12/20/2016  . HLD (hyperlipidemia) 12/20/2016  . Syncope and collapse 12/20/2016  . 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 CATARACT EXTRACTION W/ INTRAOCULAR LENS  IMPLANT, BILATERAL    . COLONOSCOPY  2006   Normal: Repeat in 10 yrs     Prior to Admission medications   Medication Sig Start Date End Date Taking? Authorizing Provider  albuterol (PROVENTIL HFA;VENTOLIN HFA) 108 (90 Base) MCG/ACT  inhaler Inhale 2 puffs into the lungs 4 (four) times daily as needed for wheezing or shortness of breath.    [provider]  aspirin EC 81 MG tablet Take 81 mg by mouth daily.    [provider]  atorvastatin (LIPITOR) 80 MG tablet Take 40 mg by mouth at bedtime.    [provider]  benzonatate (TESSALON PERLES) 100 MG capsule Take 1 capsule (100 mg total) by mouth 3 (three) times daily as needed for cough. 12/21/16   Lavina Hamman, MD  diclofenac sodium (VOLTAREN) 1 % GEL Apply 1 application topically daily as needed (shoulder pain).    [provider]  gabapentin (NEURONTIN) 300 MG capsule Take 300 mg by mouth at bedtime.    [provider]  glipiZIDE (GLUCOTROL XL) 5 MG 24 hr tablet Take 1 tablet (5 mg total) by mouth daily with breakfast. Patient not taking: Reported on 12/20/2016 08/22/15   Philemon Kingdom, MD  glucose blood (PRECISION XTRA TEST STRIPS) test strip Use to test blood sugar 2 times daily as instructed. Dx: E11.65 08/22/15   Philemon Kingdom, MD  guaiFENesin (MUCINEX) 600 MG 12 hr tablet Take 1 tablet (600 mg total) by mouth 2 (two) times daily. 12/21/16   Lavina Hamman, MD  HYDROcodone-acetaminophen (NORCO/VICODIN) 5-325 MG tablet Take 1 tablet by mouth 2 (two) times daily as needed (pain).    [provider]  insulin aspart (NOVOLOG FLEXPEN) 100 UNIT/ML FlexPen Inject 2-3 Units into the skin See admin  instructions. Inject 2-3 units subcutaneously before supper as needed for CBG >200    [provider]  insulin glargine (LANTUS) 100 UNIT/ML injection Inject 0.7 mLs (70 Units total) into the skin 2 (two) times daily. 08/22/15   Philemon Kingdom, MD  Insulin Pen Needle (CAREFINE PEN NEEDLES) 32G X 4 MM MISC Use 1x a day 08/21/14   Philemon Kingdom, MD  latanoprost (XALATAN) 0.005 % ophthalmic solution Place 1 drop into both eyes at bedtime.    [provider]  lisinopril (PRINIVIL,ZESTRIL) 30 MG tablet Take  1 tablet (30 mg total) by mouth daily. 12/21/16   Lavina Hamman, MD  Magnesium Oxide 420 MG TABS Take 420 mg by mouth daily.     [provider]  metFORMIN (GLUCOPHAGE) 1000 MG tablet Take 1 tablet (1,000 mg total) by mouth 2 (two) times daily with a meal. 03/25/15   Philemon Kingdom, MD  Multiple Vitamin (MULTIVITAMIN WITH MINERALS) TABS tablet Take 1 tablet by mouth daily.    [provider]  polyvinyl alcohol (ARTIFICIAL TEARS) 1.4 % ophthalmic solution Place 2 drops into both eyes 5 (five) times daily as needed for dry eyes.    [provider]  tamsulosin (FLOMAX) 0.4 MG CAPS capsule Take 0.4 mg by mouth at bedtime.    [provider]  tiotropium (SPIRIVA) 18 MCG inhalation capsule Place 18 mcg into inhaler and inhale 3 (three) times a week.    [provider]     Allergies Patient has no known allergies.   Family History  Problem Relation Age of Onset  . Diabetes Mother     Social History Social History   Tobacco Use  . Smoking status: Former Smoker    Quit date: 09/06/1958    Years since quitting: 60.5  . Smokeless tobacco: Never Used  Substance Use Topics  . Alcohol use: No    Alcohol/week: 0.0 standard drinks  . Drug use: No    Review of Systems  Constitutional:   No fever or chills.  ENT:   No sore throat. No rhinorrhea. Cardiovascular:   No chest pain or syncope. Respiratory:   No dyspnea or cough. Gastrointestinal:   Negative for abdominal pain, positive vomiting.  Musculoskeletal:   Negative for focal pain or swelling All other systems reviewed and are negative except as documented above in ROS and HPI.  ____________________________________________   PHYSICAL EXAM:  VITAL SIGNS: ED Triage Vitals  Enc Vitals Group     BP 03/19/19 1056 (!) 151/60     Pulse Rate 03/19/19 1056 (!) 47     Resp 03/19/19 1056 14     Temp --      Temp src --      SpO2 03/19/19 1056 100 %     Weight 03/19/19 1100 220 lb (99.8 kg)      Height 03/19/19 1100 6' (1.829 m)     Head Circumference --      Peak Flow --      Pain Score 03/19/19 1100 0     Pain Loc --      Pain Edu? --      Excl. in [place]? --     Vital signs reviewed, nursing assessments reviewed.   Constitutional:   Alert and oriented. Non-toxic appearance. Eyes:   Conjunctivae are normal. EOMI. PERRL. ENT      Head:   Normocephalic and atraumatic.      Nose:   No congestion/rhinnorhea.       Mouth/Throat:  MMM, no pharyngeal erythema. No peritonsillar mass.       Neck:   No meningismus. Full ROM. Hematological/Lymphatic/Immunilogical:   No cervical lymphadenopathy. Cardiovascular:   Bradycardia heart rate 35-45. Symmetric bilateral radial and DP pulses.  No murmurs. Cap refill less than 2 seconds. Respiratory:   Normal respiratory effort without tachypnea/retractions. Breath sounds are clear and equal bilaterally. No wheezes/rales/rhonchi. Gastrointestinal:   Soft and nontender. Non distended. There is no CVA tenderness.  No rebound, rigidity, or guarding.  Musculoskeletal:   Normal range of motion in all extremities. No joint effusions.  No lower extremity tenderness.  No edema. Neurologic:   Normal speech and language.  Motor grossly intact. No acute focal neurologic deficits are appreciated.  Skin:    Skin is warm, dry and intact. No rash noted.  No petechiae, purpura, or bullae.  ____________________________________________    LABS (pertinent positives/negatives) (all labs ordered are listed, but only abnormal results are displayed) Labs Reviewed  COMPREHENSIVE METABOLIC PANEL - Abnormal; Notable for the following components:      Result Value   Glucose, Bld 339 (*)    All other components within normal limits  CBC WITH DIFFERENTIAL/PLATELET - Abnormal; Notable for the following components:   WBC 11.8 (*)    Neutro Abs 9.1 (*)    Abs Immature Granulocytes 0.09 (*)    All other components within normal limits  SARS CORONAVIRUS 2  (HOSPITAL ORDER, [place] [facility])  LIPASE, BLOOD  PROTIME-INR  LACTIC ACID, PLASMA  LACTIC ACID, PLASMA  MAGNESIUM  TSH  TROPONIN I (HIGH SENSITIVITY)   ____________________________________________   EKG  Interpreted by me Sinus bradycardia, rate of 49, left axis, first-degree AV block.  Poor R wave progression.  Normal ST segments and T waves.  Grossly unchanged from previous except for new onset of sinus bradycardia.  Repeat EKG shows sinus bradycardia with a rate of 39, no ischemic changes.  3rd EKG shows sinus bradycardia rate of 53, left axis, no acute ischemic changes.  ____________________________________________    RADIOLOGY  No results found.  ____________________________________________   PROCEDURES .Critical Care Performed by: Carrie Mew, MD Authorized by: Carrie Mew, MD   Critical care provider statement:    Critical care time (minutes):  35   Critical care time was exclusive of:  Separately billable procedures and treating other patients   Critical care was necessary to treat or prevent imminent or life-threatening deterioration of the following conditions:  Cardiac failure   Critical care was time spent personally by me on the following activities:  Development of treatment plan with patient or surrogate, discussions with consultants, evaluation of patient's response to treatment, examination of patient, obtaining history from patient or surrogate, ordering and performing treatments and interventions, ordering and review of laboratory studies, ordering and review of radiographic studies, pulse oximetry, re-evaluation of patient's condition and review of old charts    ____________________________________________  DIFFERENTIAL DIAGNOSIS   Non-STEMI, symptomatic bradycardia, electrolyte abnormality, hypothyroidism  CLINICAL IMPRESSION / ASSESSMENT AND PLAN / ED COURSE  Medications ordered in the ED: Medications   sodium chloride 0.9 % bolus 1,000 mL (1,000 mLs Intravenous New Bag/Given 03/19/19 1126)    Pertinent labs & imaging results that were available during my care of the patient were reviewed by me and considered in my medical decision making (see chart for details).  Jaeshaun Dallaire was evaluated in Emergency Department on 03/19/2019 for the symptoms described in the history of present illness. He was evaluated in the  context of the global COVID-19 pandemic, which necessitated consideration that the patient might be at risk for infection with the SARS-CoV-2 virus that causes COVID-19. Institutional protocols and algorithms that pertain to the evaluation of patients at risk for COVID-19 are in a state of rapid change based on information released by regulatory bodies including the CDC and federal and state organizations. These policies and algorithms were followed during the patient's care in the ED.   Patient presents with nausea vomiting and orthostatic dizziness/near syncope.  EKG shows sinus bradycardia, with normal blood pressure.  Labs all unremarkable, high-sensitivity troponin normal.  This appears to be symptomatic sinus bradycardia, no evidence of acute ischemic event.  Not due to AV nodal blockade.  Discussed with cardiology Dr. Ubaldo Glassing who indicates the patient can be appropriately cared for at [facility] so I discussed with the hospitalist for further management.  Patient has pacer pads on, will do external pacing as needed but currently not required given that his symptoms are minimal while sitting semirecumbent and blood pressure is preserved.  COVID screen sent.      ____________________________________________   FINAL CLINICAL IMPRESSION(S) / ED DIAGNOSES    Final diagnoses:  Symptomatic bradycardia  Non-intractable vomiting with nausea, unspecified vomiting type     ED Discharge Orders    None      Portions of this note were generated with dragon dictation software.  Dictation errors may occur despite best attempts at proofreading.   Carrie Mew, MD 03/19/19 [phone]

## 2019-03-19 NOTE — ED Notes (Signed)
Date and time results received: 03/19/19 1244 (use smartphrase ".now" to insert current time)  Test: lactic Critical Value: 2.3  Name of Provider Notified: Joni Fears  Orders Received? Or Actions Taken?: Orders Received - See Orders for details

## 2019-03-19 NOTE — Progress Notes (Signed)
Chaplain received an OR to complete or update an AD. Upon arrival, the patient was talking with his nurse. The head of the patient's bed was elevated and he was warm and welcoming. The patient confirmed interest in the AD information and welcomed education. Chaplain provided brief education. Patient became quiet and seemed pensive and the chaplain took note of the change in the patient's demeanor. Upon acknowledgment of the difficulty of considering the Living Will and being confronted with our mortality as human beings in the face of health crises, the patient acknowledged that he "is ready to go, but doesn't want to go." Chaplain acknowledged this hard place and provided support in the form of compassionate presence and active and reflective listening.

## 2019-03-19 NOTE — ED Triage Notes (Signed)
Patient from home via [facility]. Patient states he woke up this morning with acute onset nausea and vomiting. Denies abdominal pain. States he has had contact with COVID positive people. Denies CP or SOB. A&O x4.

## 2019-03-19 NOTE — ED Notes (Signed)
Pt's wife called and updated on pt's plan of care and status. Pt spoke with his wife on the phone.

## 2019-03-19 NOTE — Care Management Obs Status (Signed)
[place] NOTIFICATION   Patient Details  Name: Dietrick Barris MRN: [id number] Date of Birth: October 04, 1940   Medicare Observation Status Notification Given:  Yes    Marshell Garfinkel, RN 03/19/2019, 1:10 PM

## 2019-03-19 NOTE — ED Notes (Signed)
Transported pt to floor at this time

## 2019-03-19 NOTE — ED Notes (Signed)
2nd lactic sent to lab per request Marissa in lab to confirm result.

## 2019-03-19 NOTE — Plan of Care (Signed)
  Problem: Education: Goal: Knowledge of General Education information will improve Description: Including pain rating scale, medication(s)/side effects and non-pharmacologic comfort measures Outcome: Progressing   Problem: Clinical Measurements: Goal: Respiratory complications will improve Outcome: Progressing   Problem: Nutrition: Goal: Adequate nutrition will be maintained Outcome: Not Progressing Note: Patient has poor appetite, complaining of nausea. Relieved my anti-emetic.

## 2019-03-19 NOTE — Progress Notes (Signed)
Advanced care plan.  Purpose of the Encounter: CODE STATUS  Parties in Attendance: Patient himself  Patient's Decision Capacity: Intact  Subjective/Patient's story:  Mathew Baker  is a 78 y.o. male with a known history of COPD, diabetes type 2, essential hypertension, hyperlipidemia, hypertension, osteoarthritis of the lower extremity who is presenting with nausea and vomiting since this morning.  Patient states that he has not had any fevers or chills but has been very nauseous and throwing up.  Patient also was very dizzy and felt like he was going to pass out.  In the emergency room he was noticed to have intermittent heart rate drop into the 30s.   Objective/Medical story I discussed with the patient regarding his desires for cardiac and pulmonary resuscitation and and asked if he has a living will or healthcare partner turning  Goals of care determination:  Patient states that he wants to be a full code and would like everything to be done   CODE STATUS: Full code   Time spent discussing advanced care planning: 16 minutes

## 2019-03-19 NOTE — ED Notes (Signed)
Pt given urinal at this time 

## 2019-03-19 NOTE — ED Notes (Signed)
ED TO INPATIENT HANDOFF REPORT  ED Nurse Name Sam and Phone #: [phone]  S Name/Age/Gender Mathew Baker 78 y.o. male Room/Bed: ED09A/ED09A  Code Status   Code Status: Prior  Home/SNF/Other Home Patient oriented to: self, place, time and situation Is this baseline? Yes   Triage Complete: Triage complete  Chief Complaint vomiting ems  Triage Note Patient from home via [facility]. Patient states he woke up this morning with acute onset nausea and vomiting. Denies abdominal pain. States he has had contact with COVID positive people. Denies CP or SOB. A&O x4.   Allergies No Known Allergies  Level of Care/Admitting Diagnosis ED Disposition    ED Disposition Condition [facility] Area: [place] [[id number]]  Level of Care: Telemetry [5]  Covid Evaluation: Person Under Investigation (PUI)  Diagnosis: Bradycardia [314970]  Admitting Physician: Dustin Flock [[id number]]  Attending Physician: Dustin Flock [[id number]]  PT Class (Do Not Modify): Observation [104]  PT Acc Code (Do Not Modify): Observation [10022]       B Medical/Surgery History Past Medical History:  Diagnosis Date  . Chronic airway obstruction ([place])   . Degeneration of intervertebral disc of lumbar region   . Diabetes mellitus without complication ([place])   . Essential hypertension, benign   . Hyperlipidemia   . Hypertension   . Osteoarthritis of lower extremity   . Pneumonia, organism unspecified(486)   . PSA elevation 07/16/2013   Normal   Past Surgical History:  Procedure Laterality Date  . CATARACT EXTRACTION W/ INTRAOCULAR LENS  IMPLANT, BILATERAL    . COLONOSCOPY  2006   Normal: Repeat in 10 yrs     A IV Location/Drains/Wounds Patient Lines/Drains/Airways Status   Active Line/Drains/Airways    Name:   Placement date:   Placement time:   Site:   Days:   Peripheral IV 03/19/19 Left Hand   03/19/19    1109    Hand   less than 1   Peripheral IV 03/19/19 Left  Wrist   03/19/19    1110    Wrist   less than 1          Intake/Output Last 24 hours  Intake/Output Summary (Last 24 hours) at 03/19/2019 1329 Last data filed at 03/19/2019 1314 Gross per 24 hour  Intake 1000 ml  Output 300 ml  Net 700 ml    Labs/Imaging Results for orders placed or performed during the hospital encounter of 03/19/19 (from the past 48 hour(s))  Comprehensive metabolic panel     Status: Abnormal   Collection Time: 03/19/19 11:08 AM  Result Value Ref Range   Sodium 136 135 - 145 mmol/L   Potassium 3.8 3.5 - 5.1 mmol/L   Chloride 102 98 - 111 mmol/L   CO2 24 22 - 32 mmol/L   Glucose, Bld 339 (H) 70 - 99 mg/dL   BUN 15 8 - 23 mg/dL   Creatinine, Ser 0.79 0.61 - 1.24 mg/dL   Calcium 9.0 8.9 - 10.3 mg/dL   Total Protein 7.5 6.5 - 8.1 g/dL   Albumin 4.0 3.5 - 5.0 g/dL   AST 22 15 - 41 U/L   ALT 21 0 - 44 U/L   Alkaline Phosphatase 103 38 - 126 U/L   Total Bilirubin 1.0 0.3 - 1.2 mg/dL   GFR calc non Af Amer >60 >60 mL/min   GFR calc Af Amer >60 >60 mL/min   Anion gap 10 5 - 15    Comment: Performed at  [facility], [address] [place], [place] [address]  Lipase, blood     Status: None   Collection Time: 03/19/19 11:08 AM  Result Value Ref Range   Lipase 40 11 - 51 U/L    Comment: Performed at [facility], [place], [place] [address]  Troponin I (High Sensitivity)     Status: None   Collection Time: 03/19/19 11:08 AM  Result Value Ref Range   Troponin I (High Sensitivity) 10 <18 ng/L    Comment: (NOTE) Elevated high sensitivity troponin I (hsTnI) values and significant  changes across serial measurements may suggest ACS but many other  chronic and acute conditions are known to elevate hsTnI results.  Refer to the "Links" section for chest pain algorithms and additional  guidance. Performed at [facility], [address] [place], [place] [address]   CBC with Differential     Status: Abnormal    Collection Time: 03/19/19 11:08 AM  Result Value Ref Range   WBC 11.8 (H) 4.0 - 10.5 K/uL   RBC 4.42 4.22 - 5.81 MIL/uL   Hemoglobin 14.6 13.0 - 17.0 g/dL   HCT 42.6 39.0 - 52.0 %   MCV 96.4 80.0 - 100.0 fL   MCH 33.0 26.0 - 34.0 pg   MCHC 34.3 30.0 - 36.0 g/dL   RDW 12.5 11.5 - 15.5 %   Platelets 214 150 - 400 K/uL   nRBC 0.0 0.0 - 0.2 %   Neutrophils Relative % 76 %   Neutro Abs 9.1 (H) 1.7 - 7.7 K/uL   Lymphocytes Relative 14 %   Lymphs Abs 1.6 0.7 - 4.0 K/uL   Monocytes Relative 7 %   Monocytes Absolute 0.8 0.1 - 1.0 K/uL   Eosinophils Relative 2 %   Eosinophils Absolute 0.2 0.0 - 0.5 K/uL   Basophils Relative 0 %   Basophils Absolute 0.0 0.0 - 0.1 K/uL   Immature Granulocytes 1 %   Abs Immature Granulocytes 0.09 (H) 0.00 - 0.07 K/uL    Comment: Performed at [facility], [address] [place], [place] [address]  Protime-INR     Status: None   Collection Time: 03/19/19 11:08 AM  Result Value Ref Range   Prothrombin Time 12.5 11.4 - 15.2 seconds   INR 0.9 0.8 - 1.2    Comment: (NOTE) INR goal varies based on device and disease states. Performed at [facility], [address] [place], [place] [address]   Magnesium     Status: None   Collection Time: 03/19/19 11:08 AM  Result Value Ref Range   Magnesium 1.8 1.7 - 2.4 mg/dL    Comment: Performed at [facility], [address] [place], [place] [address]  TSH     Status: None   Collection Time: 03/19/19 11:08 AM  Result Value Ref Range   TSH 3.477 0.350 - 4.500 uIU/mL    Comment: Performed by a 3rd Generation assay with a functional sensitivity of <=0.01 uIU/mL. Performed at [facility], [place], [place] [address]   Lactic acid, plasma     Status: Abnormal   Collection Time: 03/19/19 11:09 AM  Result Value Ref Range   Lactic Acid, Venous 2.3 (HH) 0.5 - 1.9 mmol/L    Comment: CRITICAL RESULT CALLED TO, READ BACK BY AND VERIFIED WITH Guyla Bless AT  1146 ON 03/19/2019 [place]. Performed at [facility], [address] [place], [place] [address]    No  results found.  Pending Labs Unresulted Labs (From admission, onward)    Start     Ordered   03/19/19 1104  SARS Coronavirus 2 (CEPHEID - Performed in [place] hospital lab), [place]  (Asymptomatic Patients Labs)  ONCE - STAT,   STAT    Question:  Rule Out  Answer:  Yes   03/19/19 1103   03/19/19 1103  Lactic acid, plasma  Now then every 2 hours,   STAT     03/19/19 1103   Signed and Held  CBC  (enoxaparin (LOVENOX)    CrCl >/= 30 ml/min)  Once,   R    Comments: Baseline for enoxaparin therapy IF NOT ALREADY DRAWN.  Notify MD if PLT < 100 K.    Signed and Held   Signed and Held  Creatinine, serum  (enoxaparin (LOVENOX)    CrCl >/= 30 ml/min)  Once,   R    Comments: Baseline for enoxaparin therapy IF NOT ALREADY DRAWN.    Signed and Held   Signed and Held  Creatinine, serum  (enoxaparin (LOVENOX)    CrCl >/= 30 ml/min)  Weekly,   R    Comments: while on enoxaparin therapy    Signed and Held   Signed and Held  TSH  Once,   R     Signed and Held   Signed and Held  CBC  Tomorrow morning,   R     Signed and Held   Signed and Held  Basic metabolic panel  Tomorrow morning,   R     Signed and Held          Vitals/Pain Today's Vitals   03/19/19 1126 03/19/19 1130 03/19/19 1200 03/19/19 1230  BP:  (!) 162/58 (!) 151/66 (!) 156/59  Pulse:  (!) 49 (!) 57 (!) 48  Resp:  13 13 13   SpO2:  100% 99% 98%  Weight:      Height:      PainSc: 0-No pain       Isolation Precautions Droplet and Contact precautions  Medications Medications  sodium chloride 0.9 % bolus 1,000 mL (0 mLs Intravenous Stopped 03/19/19 1314)    Mobility walks Low fall risk   Focused Assessments Cardiac Assessment Handoff:  Cardiac Rhythm: Sinus bradycardia Lab Results  Component Value Date   TROPONINI <0.03 12/21/2016   Lab Results  Component Value Date   DDIMER 0.75 (H) 12/20/2016    Does the Patient currently have chest pain? No     R Recommendations: See Admitting Provider Note  Report given to:   Additional Notes:

## 2019-03-19 NOTE — Progress Notes (Signed)
Report received from [facility] RN. Discussed previous notes from RN were not pertained to this patient.

## 2019-03-19 NOTE — H&P (Signed)
[facility] [place] NAME: Mathew Baker    MR#:  [id number]  DATE OF BIRTH:  Jan 09, 1941  DATE OF ADMISSION:  03/19/2019  PRIMARY CARE PHYSICIAN: Patient, No Pcp Per   REQUESTING/REFERRING PHYSICIAN: Carrie Mew, MD  CHIEF COMPLAINT:   Chief Complaint  Patient presents with  . Emesis  . Nausea    HISTORY OF PRESENT ILLNESS: Mathew Baker  is a 78 y.o. male with a known history of COPD, diabetes type 2, essential hypertension, hyperlipidemia, hypertension, osteoarthritis of the lower extremity who is presenting with nausea and vomiting since this morning.  Patient states that he has not had any fevers or chills but has been very nauseous and throwing up.  Patient also was very dizzy and felt like he was going to pass out.  In the emergency room he was noticed to have intermittent heart rate drop into the 30s.  PAST MEDICAL HISTORY:   Past Medical History:  Diagnosis Date  . Chronic airway obstruction ([place])   . Degeneration of intervertebral disc of lumbar region   . Diabetes mellitus without complication ([place])   . Essential hypertension, benign   . Hyperlipidemia   . Hypertension   . Osteoarthritis of lower extremity   . Pneumonia, organism unspecified(486)   . PSA elevation 07/16/2013   Normal    PAST SURGICAL HISTORY:  Past Surgical History:  Procedure Laterality Date  . CATARACT EXTRACTION W/ INTRAOCULAR LENS  IMPLANT, BILATERAL    . COLONOSCOPY  2006   Normal: Repeat in 10 yrs    SOCIAL HISTORY:  Social History   Tobacco Use  . Smoking status: Former Smoker    Quit date: 09/06/1958    Years since quitting: 60.5  . Smokeless tobacco: Never Used  Substance Use Topics  . Alcohol use: No    Alcohol/week: 0.0 standard drinks    FAMILY HISTORY:  Family History  Problem Relation Age of Onset  . Diabetes Mother     DRUG ALLERGIES: No Known Allergies  REVIEW OF SYSTEMS:   CONSTITUTIONAL: No fever, positive  fatigue or positive weakness.  EYES: No blurred or double vision.  EARS, NOSE, AND THROAT: No tinnitus or ear pain.  RESPIRATORY: No cough, shortness of breath, wheezing or hemoptysis.  CARDIOVASCULAR: No chest pain, orthopnea, edema.  GASTROINTESTINAL: No nausea, vomiting, diarrhea or abdominal pain.  GENITOURINARY: No dysuria, hematuria.  ENDOCRINE: No polyuria, nocturia,  HEMATOLOGY: No anemia, easy bruising or bleeding SKIN: No rash or lesion. MUSCULOSKELETAL: No joint pain or arthritis.   NEUROLOGIC: No tingling, numbness, weakness.  PSYCHIATRY: No anxiety or depression.   MEDICATIONS AT HOME:  Prior to Admission medications   Medication Sig Start Date End Date Taking? Authorizing Provider  albuterol (PROVENTIL HFA;VENTOLIN HFA) 108 (90 Base) MCG/ACT inhaler Inhale 2 puffs into the lungs 4 (four) times daily as needed for wheezing or shortness of breath.    [provider]  aspirin EC 81 MG tablet Take 81 mg by mouth daily.    [provider]  atorvastatin (LIPITOR) 80 MG tablet Take 40 mg by mouth at bedtime.    [provider]  benzonatate (TESSALON PERLES) 100 MG capsule Take 1 capsule (100 mg total) by mouth 3 (three) times daily as needed for cough. 12/21/16   Lavina Hamman, MD  diclofenac sodium (VOLTAREN) 1 % GEL Apply 1 application topically daily as needed (shoulder pain).    [provider]  gabapentin (NEURONTIN) 300 MG capsule Take  300 mg by mouth at bedtime.    [provider]  glipiZIDE (GLUCOTROL XL) 5 MG 24 hr tablet Take 1 tablet (5 mg total) by mouth daily with breakfast. Patient not taking: Reported on 12/20/2016 08/22/15   Philemon Kingdom, MD  glucose blood (PRECISION XTRA TEST STRIPS) test strip Use to test blood sugar 2 times daily as instructed. Dx: E11.65 08/22/15   Philemon Kingdom, MD  guaiFENesin (MUCINEX) 600 MG 12 hr tablet Take 1 tablet (600 mg total) by mouth 2 (two) times daily. 12/21/16   Lavina Hamman,  MD  HYDROcodone-acetaminophen (NORCO/VICODIN) 5-325 MG tablet Take 1 tablet by mouth 2 (two) times daily as needed (pain).    [provider]  insulin aspart (NOVOLOG FLEXPEN) 100 UNIT/ML FlexPen Inject 2-3 Units into the skin See admin instructions. Inject 2-3 units subcutaneously before supper as needed for CBG >200    [provider]  insulin glargine (LANTUS) 100 UNIT/ML injection Inject 0.7 mLs (70 Units total) into the skin 2 (two) times daily. 08/22/15   Philemon Kingdom, MD  Insulin Pen Needle (CAREFINE PEN NEEDLES) 32G X 4 MM MISC Use 1x a day 08/21/14   Philemon Kingdom, MD  latanoprost (XALATAN) 0.005 % ophthalmic solution Place 1 drop into both eyes at bedtime.    [provider]  lisinopril (PRINIVIL,ZESTRIL) 30 MG tablet Take 1 tablet (30 mg total) by mouth daily. 12/21/16   Lavina Hamman, MD  Magnesium Oxide 420 MG TABS Take 420 mg by mouth daily.     [provider]  metFORMIN (GLUCOPHAGE) 1000 MG tablet Take 1 tablet (1,000 mg total) by mouth 2 (two) times daily with a meal. 03/25/15   Philemon Kingdom, MD  Multiple Vitamin (MULTIVITAMIN WITH MINERALS) TABS tablet Take 1 tablet by mouth daily.    [provider]  polyvinyl alcohol (ARTIFICIAL TEARS) 1.4 % ophthalmic solution Place 2 drops into both eyes 5 (five) times daily as needed for dry eyes.    [provider]  tamsulosin (FLOMAX) 0.4 MG CAPS capsule Take 0.4 mg by mouth at bedtime.    [provider]  tiotropium (SPIRIVA) 18 MCG inhalation capsule Place 18 mcg into inhaler and inhale 3 (three) times a week.    [provider]      PHYSICAL EXAMINATION:   VITAL SIGNS: Blood pressure (!) 151/66, pulse (!) 57, resp. rate 13, height 6' (1.829 m), weight 99.8 kg, SpO2 99 %.  GENERAL:  78 y.o.-year-old patient lying in the bed with no acute distress.  EYES: Pupils equal, round, reactive to light and accommodation. No scleral icterus. Extraocular muscles  intact.  HEENT: Head atraumatic, normocephalic. Oropharynx and nasopharynx clear.  NECK:  Supple, no jugular venous distention. No thyroid enlargement, no tenderness.  LUNGS: Normal breath sounds bilaterally, no wheezing, rales,rhonchi or crepitation. No use of accessory muscles of respiration.  CARDIOVASCULAR: S1, S2 normal. No murmurs, rubs, or gallops.  ABDOMEN: Soft, nontender, nondistended. Bowel sounds present. No organomegaly or mass.  EXTREMITIES: No pedal edema, cyanosis, or clubbing.  NEUROLOGIC: Cranial nerves II through XII are intact. Muscle strength 5/5 in all extremities. Sensation intact. Gait not checked.  PSYCHIATRIC: The patient is alert and oriented x 3.  SKIN: No obvious rash, lesion, or ulcer.   LABORATORY PANEL:   CBC Recent Labs  Lab 03/19/19 1108  WBC 11.8*  HGB 14.6  HCT 42.6  PLT 214  MCV 96.4  MCH 33.0  MCHC 34.3  RDW 12.5  LYMPHSABS 1.6  MONOABS 0.8  EOSABS 0.2  BASOSABS 0.0   ------------------------------------------------------------------------------------------------------------------  Chemistries  Recent Labs  Lab 03/19/19 1108  NA 136  K 3.8  CL 102  CO2 24  GLUCOSE 339*  BUN 15  CREATININE 0.79  CALCIUM 9.0  AST 22  ALT 21  ALKPHOS 103  BILITOT 1.0   ------------------------------------------------------------------------------------------------------------------ estimated creatinine clearance is 91.6 mL/min (by C-G formula based on SCr of 0.79 mg/dL). ------------------------------------------------------------------------------------------------------------------ No results for input(s): TSH, T4TOTAL, T3FREE, THYROIDAB in the last 72 hours.  Invalid input(s): FREET3   Coagulation profile Recent Labs  Lab 03/19/19 1108  INR 0.9   ------------------------------------------------------------------------------------------------------------------- No results for input(s): DDIMER in the last 72  hours. -------------------------------------------------------------------------------------------------------------------  Cardiac Enzymes No results for input(s): CKMB, TROPONINI, MYOGLOBIN in the last 168 hours.  Invalid input(s): CK ------------------------------------------------------------------------------------------------------------------ Invalid input(s): POCBNP  ---------------------------------------------------------------------------------------------------------------  Urinalysis    Component Value Date/Time   COLORURINE YELLOW 12/21/2016 0002   APPEARANCEUR CLEAR 12/21/2016 0002   LABSPEC 1.036 (H) 12/21/2016 0002   PHURINE 5.0 12/21/2016 0002   GLUCOSEU >=500 (A) 12/21/2016 0002   HGBUR NEGATIVE 12/21/2016 0002   BILIRUBINUR NEGATIVE 12/21/2016 0002   KETONESUR NEGATIVE 12/21/2016 0002   PROTEINUR 30 (A) 12/21/2016 0002   NITRITE NEGATIVE 12/21/2016 0002   LEUKOCYTESUR NEGATIVE 12/21/2016 0002     RADIOLOGY: No results found.  EKG: Orders placed or performed during the hospital encounter of 12/20/16  . ED EKG  . ED EKG  . EKG 12-Lead  . EKG 12-Lead  . ED EKG  . ED EKG  . EKG 12-Lead  . EKG 12-Lead  . EKG  . EKG    IMPRESSION AND PLAN: Patient 78 year old presenting with dizziness nausea vomiting  1.  Symptomatic bradycardia patient does not appear to be on any heart role rate lowering medications Cardiology has been notified of admission We will monitor on telemetry Echocardiogram of the heart  2.  Diabetes type 2 we will continue Lantus hold metformin due to nausea, I will also hold Glucotrol XL  3.  Hyperlipidemia continue Lipitor  4.  Glaucoma continue eyedrops  5.  Hypertension continue lisinopril monitor blood pressure  All the records are reviewed and case discussed with ED provider. Management plans discussed with the patient, family and they are in agreement.  CODE STATUS: Code Status History    Date Active Date Inactive  Code Status Order ID Comments User Context   12/20/2016 2042 12/21/2016 2140 Full Code [id number]  Lily Kocher, MD Inpatient   Advance Care Planning Activity       TOTAL TIME TAKING CARE OF THIS PATIENT: 55 minutes.    Dustin Flock M.D on 03/19/2019 at 12:06 PM  Between 7am to 6pm - Pager - [phone]  After 6pm go to [email or web address] - password [email or web address] [facility]  [employer] [facility]  [phone]  CC: Primary care physician; Patient, No Pcp Per

## 2019-03-19 NOTE — ED Notes (Signed)
Attempted to call again and placed on hold. RN Janett Billow on phone and requesting time for RN to view chart and that room is not ready at this time. RN Charge aware and informed to take pt to floor at this time.

## 2019-03-19 NOTE — ED Notes (Addendum)
Attempted to call for pt's room and spoke with Charge RN Janett Billow. RN Janett Billow to call admitting MD to inquire about a 2nd COVID test to be performed for pt in am. Will call back shortly. RN Geophysicist/field seismologist informed

## 2019-03-19 NOTE — Progress Notes (Signed)
Attempted to call Sharmon Revere, wife, to update per patient request. Call went directly to voicemail. Patient notified.

## 2019-03-20 ENCOUNTER — Observation Stay
Admit: 2019-03-20 | Discharge: 2019-03-20 | Disposition: A | Discharge status: Home / Self Care | Payer: Medicare Other | Attending: Internal Medicine | Admitting: Internal Medicine

## 2019-03-20 ENCOUNTER — Inpatient Hospital Stay: Payer: Medicare Other

## 2019-03-20 DIAGNOSIS — Z20828 Contact with and (suspected) exposure to other viral communicable diseases: Secondary | ICD-10-CM | POA: Diagnosis present

## 2019-03-20 DIAGNOSIS — J449 Chronic obstructive pulmonary disease, unspecified: Secondary | ICD-10-CM | POA: Diagnosis present

## 2019-03-20 DIAGNOSIS — R29700 NIHSS score 0: Secondary | ICD-10-CM | POA: Diagnosis present

## 2019-03-20 DIAGNOSIS — Z87891 Personal history of nicotine dependence: Secondary | ICD-10-CM | POA: Diagnosis not present

## 2019-03-20 DIAGNOSIS — Z09 Encounter for follow-up examination after completed treatment for conditions other than malignant neoplasm: Secondary | ICD-10-CM | POA: Diagnosis not present

## 2019-03-20 DIAGNOSIS — I358 Other nonrheumatic aortic valve disorders: Secondary | ICD-10-CM | POA: Diagnosis present

## 2019-03-20 DIAGNOSIS — I6523 Occlusion and stenosis of bilateral carotid arteries: Secondary | ICD-10-CM | POA: Diagnosis not present

## 2019-03-20 DIAGNOSIS — K5900 Constipation, unspecified: Secondary | ICD-10-CM | POA: Diagnosis present

## 2019-03-20 DIAGNOSIS — M5136 Other intervertebral disc degeneration, lumbar region: Secondary | ICD-10-CM | POA: Diagnosis present

## 2019-03-20 DIAGNOSIS — G936 Cerebral edema: Secondary | ICD-10-CM | POA: Diagnosis present

## 2019-03-20 DIAGNOSIS — H409 Unspecified glaucoma: Secondary | ICD-10-CM | POA: Diagnosis present

## 2019-03-20 DIAGNOSIS — R42 Dizziness and giddiness: Secondary | ICD-10-CM | POA: Diagnosis not present

## 2019-03-20 DIAGNOSIS — Z23 Encounter for immunization: Secondary | ICD-10-CM | POA: Diagnosis not present

## 2019-03-20 DIAGNOSIS — Z833 Family history of diabetes mellitus: Secondary | ICD-10-CM | POA: Diagnosis not present

## 2019-03-20 DIAGNOSIS — R112 Nausea with vomiting, unspecified: Secondary | ICD-10-CM | POA: Diagnosis not present

## 2019-03-20 DIAGNOSIS — I6389 Other cerebral infarction: Secondary | ICD-10-CM | POA: Diagnosis present

## 2019-03-20 DIAGNOSIS — I639 Cerebral infarction, unspecified: Secondary | ICD-10-CM | POA: Diagnosis not present

## 2019-03-20 DIAGNOSIS — I10 Essential (primary) hypertension: Secondary | ICD-10-CM | POA: Diagnosis present

## 2019-03-20 DIAGNOSIS — Z9181 History of falling: Secondary | ICD-10-CM | POA: Diagnosis not present

## 2019-03-20 DIAGNOSIS — E119 Type 2 diabetes mellitus without complications: Secondary | ICD-10-CM | POA: Diagnosis present

## 2019-03-20 DIAGNOSIS — Z794 Long term (current) use of insulin: Secondary | ICD-10-CM | POA: Diagnosis not present

## 2019-03-20 DIAGNOSIS — E785 Hyperlipidemia, unspecified: Secondary | ICD-10-CM | POA: Diagnosis present

## 2019-03-20 DIAGNOSIS — K2900 Acute gastritis without bleeding: Secondary | ICD-10-CM | POA: Diagnosis present

## 2019-03-20 DIAGNOSIS — Z7982 Long term (current) use of aspirin: Secondary | ICD-10-CM | POA: Diagnosis not present

## 2019-03-20 DIAGNOSIS — R001 Bradycardia, unspecified: Secondary | ICD-10-CM | POA: Diagnosis present

## 2019-03-20 DIAGNOSIS — R55 Syncope and collapse: Secondary | ICD-10-CM | POA: Diagnosis present

## 2019-03-20 DIAGNOSIS — I495 Sick sinus syndrome: Secondary | ICD-10-CM | POA: Diagnosis not present

## 2019-03-20 LAB — GLUCOSE, CAPILLARY
Glucose-Capillary: 226 mg/dL — ABNORMAL HIGH (ref 70–99)
Glucose-Capillary: 190 mg/dL — ABNORMAL HIGH (ref 70–99)
Glucose-Capillary: 225 mg/dL — ABNORMAL HIGH (ref 70–99)
Glucose-Capillary: 171 mg/dL — ABNORMAL HIGH (ref 70–99)
Glucose-Capillary: 150 mg/dL — ABNORMAL HIGH (ref 70–99)

## 2019-03-20 LAB — BASIC METABOLIC PANEL
Anion gap: 10 (ref 5–15)
BUN: 16 mg/dL (ref 8–23)
CO2: 21 mmol/L — ABNORMAL LOW (ref 22–32)
Calcium: 8.5 mg/dL — ABNORMAL LOW (ref 8.9–10.3)
Chloride: 106 mmol/L (ref 98–111)
Creatinine, Ser: 0.69 mg/dL (ref 0.61–1.24)
GFR calc Af Amer: >60 mL/min (ref >60)
GFR calc non Af Amer: >60 mL/min (ref >60)
Glucose, Bld: 195 mg/dL — ABNORMAL HIGH (ref 70–99)
Potassium: 3.7 mmol/L (ref 3.5–5.1)
Sodium: 137 mmol/L (ref 135–145)

## 2019-03-20 LAB — CBC
HCT: 38.1 % — ABNORMAL LOW (ref 39.0–52.0)
Hemoglobin: 13.4 g/dL (ref 13.0–17.0)
MCH: 33.7 pg (ref 26.0–34.0)
MCHC: 35.2 g/dL (ref 30.0–36.0)
MCV: 95.7 fL (ref 80.0–100.0)
Platelets: 224 10*3/uL (ref 150–400)
RBC: 3.98 MIL/uL — ABNORMAL LOW (ref 4.22–5.81)
RDW: 12.6 % (ref 11.5–15.5)
WBC: 13.9 10*3/uL — ABNORMAL HIGH (ref 4.0–10.5)
nRBC: 0 % (ref 0.0–0.2)

## 2019-03-20 LAB — ECHOCARDIOGRAM COMPLETE
Height: 73 in
Weight: 3531.2 oz

## 2019-03-20 MED ORDER — INSULIN GLARGINE 100 UNIT/ML ~~LOC~~ SOLN
70.0000 [IU] | Freq: Two times a day (BID) | SUBCUTANEOUS | Status: DC
  Filled 2019-03-20 (×2): qty 0.7

## 2019-03-20 MED ORDER — INSULIN GLARGINE 100 UNIT/ML ~~LOC~~ SOLN: 50.0000 [IU] | Freq: Every day | SUBCUTANEOUS | Status: DC

## 2019-03-20 MED ORDER — INSULIN GLARGINE 100 UNIT/ML ~~LOC~~ SOLN
50.0000 [IU] | Freq: Once | SUBCUTANEOUS | Status: AC
  Administered 2019-03-20: 23:00:00 50 [IU] via SUBCUTANEOUS
  Filled 2019-03-20: qty 0.5

## 2019-03-20 NOTE — Progress Notes (Signed)
[facility] Cardiology  SUBJECTIVE: [name] is a 78 year old male with a past medical history significant for type 2 diabetes, COPD, hypertension, and hyperlipidemia who presented to the ED on 03/19/19 for a 3 week history of intermittent dizziness and near syncopal episodes as well as a one day history of nausea/vomiting.  Workup in the ED was significant for borderline elevated WBC count and an elevated lactic acid, 2.3.  COVID negative.  High sensitivity troponin negative. In the ED, he was noted to be bradycardic with rates in the 30s, so he was admitted for further workup.   Today, [name] admits to nausea and a recent episode of emesis, as well as ongoing dizziness.  Denies chest pain or shortness of breath.  Denies palpitations or heart racing sensation.  Denies orthopnea or PND.   Vitals:   03/19/19 1830 03/19/19 1935 03/20/19 0311 03/20/19 0737  BP: (!) 144/78 127/65 (!) 154/70 (!) 156/70  Pulse: (!) 54 65 61 61  Resp: 18     Temp: 97.8 F (36.6 C) 97.7 F (36.5 C) (!) 97.5 F (36.4 C) 98.1 F (36.7 C)  TempSrc: Oral Oral Oral Oral  SpO2: 99% 98% 100% 99%  Weight:      Height:         Intake/Output Summary (Last 24 hours) at 03/20/2019 0846 Last data filed at 03/20/2019 0805 Gross per 24 hour  Intake 2193.24 ml  Output 2050 ml  Net 143.24 ml      PHYSICAL EXAM  General: Well developed, well nourished, in no acute distress HEENT:  Normocephalic and atramatic Neck:  No JVD.  Lungs: Clear bilaterally to auscultation and percussion. Heart: HRRR . Normal S1 and S2 without gallops or murmurs.  Abdomen: Bowel sounds are positive, abdomen soft and non-tender  Msk:  Back normal, normal gait. Normal strength and tone for age. Extremities: No clubbing, cyanosis or edema.   Neuro: Alert and oriented X 3. Psych:  Good affect, responds appropriately   LABS: Basic Metabolic Panel: Recent Labs    03/19/19 1108 03/20/19 0535  NA 136 137  K 3.8 3.7  CL 102 106  CO2 24 21*   GLUCOSE 339* 195*  BUN 15 16  CREATININE 0.79 0.69  CALCIUM 9.0 8.5*  MG 1.8  --    Liver Function Tests: Recent Labs    03/19/19 1108  AST 22  ALT 21  ALKPHOS 103  BILITOT 1.0  PROT 7.5  ALBUMIN 4.0   Recent Labs    03/19/19 1108  LIPASE 40   CBC: Recent Labs    03/19/19 1108 03/20/19 0535  WBC 11.8* 13.9*  NEUTROABS 9.1*  --   HGB 14.6 13.4  HCT 42.6 38.1*  MCV 96.4 95.7  PLT 214 224   Cardiac Enzymes: No results for input(s): CKTOTAL, CKMB, CKMBINDEX, TROPONINI in the last 72 hours. BNP: Invalid input(s): POCBNP D-Dimer: No results for input(s): DDIMER in the last 72 hours. Hemoglobin A1C: Recent Labs    03/19/19 1108  HGBA1C 9.0*   Fasting Lipid Panel: No results for input(s): CHOL, HDL, LDLCALC, TRIG, CHOLHDL, LDLDIRECT in the last 72 hours. Thyroid Function Tests: Recent Labs    03/19/19 1314  TSH 2.453   Anemia Panel: No results for input(s): VITAMINB12, FOLATE, FERRITIN, TIBC, IRON, RETICCTPCT in the last 72 hours.  No results found.   Echo: Pending   TELEMETRY: Normal sinus rhythm, rate int he 60s   ASSESSMENT AND PLAN:  Active Problems:   Bradycardia  1.  Symptomatic bradycardia/dizziness    -Rate is currently in the 60's; echocardiogram pending   -Continues to admit to intermittent dizziness; pending echo results, will consider carotid ultrasound and head CT for further evaluation  2.  Heart murmur   -Echo pending  3.  Type 2 diabetes  -Continue sliding scale insulin  4.  Hypertension   -Continue lisinopril  5.  Hyperlipidemia   -Continue atorvastatin     The history, physical exam findings, and plan of care were all discussed with Dr. Bartholome Bill, and all decision making was made in collaboration.   [name]  PA-C 03/20/2019 8:46 AM

## 2019-03-20 NOTE — Progress Notes (Addendum)
Wife, Mathew Baker, updated over the phone. Requested MD to call. Dr. Margaretmary Eddy notified.

## 2019-03-20 NOTE — Progress Notes (Signed)
*  PRELIMINARY RESULTS* Echocardiogram 2D Echocardiogram has been performed.  Sherrie Sport 03/20/2019, 9:41 AM

## 2019-03-20 NOTE — Plan of Care (Signed)
  Problem: Education: Goal: Knowledge of General Education information will improve Description: Including pain rating scale, medication(s)/side effects and non-pharmacologic comfort measures Outcome: Progressing   Problem: Clinical Measurements: Goal: Cardiovascular complication will be avoided Outcome: Progressing Note: Patient's heart rate better. Ranging high 50's-60's.    Problem: Skin Integrity: Goal: Risk for impaired skin integrity will decrease Outcome: Progressing

## 2019-03-20 NOTE — Progress Notes (Addendum)
[facility] [place] NAME: Mathew Baker    MR#:  [id number]  DATE OF BIRTH:  11-04-1940  SUBJECTIVE:  CHIEF COMPLAINT: Reporting nausea and vomiting some discomfort in the stomach but no pain denies any diarrhea  REVIEW OF SYSTEMS:  CONSTITUTIONAL: No fever, fatigue or weakness.  EYES: No blurred or double vision.  EARS, NOSE, AND THROAT: No tinnitus or ear pain.  RESPIRATORY: No cough, shortness of breath, wheezing or hemoptysis.  CARDIOVASCULAR: No chest pain, orthopnea, edema.  GASTROINTESTINAL: No nausea, vomiting, diarrhea or abdominal pain.  GENITOURINARY: No dysuria, hematuria.  ENDOCRINE: No polyuria, nocturia,  HEMATOLOGY: No anemia, easy bruising or bleeding SKIN: No rash or lesion. MUSCULOSKELETAL: No joint pain or arthritis.   NEUROLOGIC: No tingling, numbness, weakness.  PSYCHIATRY: No anxiety or depression.   DRUG ALLERGIES:  No Known Allergies  VITALS:  Blood pressure (!) 156/70, pulse 61, temperature 98.1 F (36.7 C), temperature source Oral, resp. rate 18, height 6\' 1"  (1.854 m), weight 100.1 kg, SpO2 99 %.  PHYSICAL EXAMINATION:  GENERAL:  78 y.o.-year-old patient lying in the bed with no acute distress.  EYES: Pupils equal, round, reactive to light and accommodation. No scleral icterus. Extraocular muscles intact.  HEENT: Head atraumatic, normocephalic. Oropharynx and nasopharynx clear.  NECK:  Supple, no jugular venous distention. No thyroid enlargement, no tenderness.  LUNGS: Normal breath sounds bilaterally, no wheezing, rales,rhonchi or crepitation. No use of accessory muscles of respiration.  CARDIOVASCULAR: S1, S2 normal. No murmurs, rubs, or gallops.  ABDOMEN: Soft, nontender, nondistended. Bowel sounds present. No organomegaly or mass.  EXTREMITIES: No pedal edema, cyanosis, or clubbing.  NEUROLOGIC: Cranial nerves II through XII are intact. Muscle strength 5/5 in all extremities. Sensation intact.  Gait not checked.  PSYCHIATRIC: The patient is alert and oriented x 3.  SKIN: No obvious rash, lesion, or ulcer.    LABORATORY PANEL:   CBC Recent Labs  Lab 03/20/19 0535  WBC 13.9*  HGB 13.4  HCT 38.1*  PLT 224   ------------------------------------------------------------------------------------------------------------------  Chemistries  Recent Labs  Lab 03/19/19 1108 03/20/19 0535  NA 136 137  K 3.8 3.7  CL 102 106  CO2 24 21*  GLUCOSE 339* 195*  BUN 15 16  CREATININE 0.79 0.69  CALCIUM 9.0 8.5*  MG 1.8  --   AST 22  --   ALT 21  --   ALKPHOS 103  --   BILITOT 1.0  --    ------------------------------------------------------------------------------------------------------------------  Cardiac Enzymes No results for input(s): TROPONINI in the last 168 hours. ------------------------------------------------------------------------------------------------------------------  RADIOLOGY:  No results found.  EKG:   Orders placed or performed during the hospital encounter of 12/20/16  . ED EKG  . ED EKG  . EKG 12-Lead  . EKG 12-Lead  . ED EKG  . ED EKG  . EKG 12-Lead  . EKG 12-Lead  . EKG  . EKG    ASSESSMENT AND PLAN:   Patient 78 year old presenting with dizziness nausea vomiting  1.  Symptomatic bradycardia patient does not appear to be on any heart role rate lowering medications Cardiology has been notified of admission We will monitor on telemetry Echocardiogram of the heart- -stress test in May 2018 has revealed normal LV function with EF estimated at 57% no evidence of stress-induced ischemia -We will get carotid Dopplers and CT head  2.  Diabetes type 2 we will continue Lantus hold metformin due to nausea, I will also hold Glucotrol XL Dietitian consult  for diet education  3.  Hyperlipidemia continue Lipitor  4.  Glaucoma continue eyedrops  5.  Hypertension continue lisinopril monitor blood pressure  6.  Acute gastritis we will  check GI panel PPI.  Check for H. pylori    All the records are reviewed and case discussed with Care Management/Social Workerr. Management plans discussed with the patient, family and they are in agreement.  CODE STATUS: fc  TOTAL TIME TAKING CARE OF THIS PATIENT: 36  minutes.   POSSIBLE D/C IN  DAYS, DEPENDING ON CLINICAL CONDITION.  Note: This dictation was prepared with Dragon dictation along with smaller phrase technology. Any transcriptional errors that result from this process are unintentional.   Nicholes Mango M.D on 03/20/2019 at 4:52 PM  Between 7am to 6pm - Pager - [phone] After 6pm go to [email or web address] - password [email or web address] [facility]  Office  [phone]  CC: Primary care physician; Patient, No Pcp Per

## 2019-03-20 NOTE — Progress Notes (Signed)
Ch f/u with pt to finalize AD for HPOA. Pt shared that he has been admitted due to fainting/falling/vomitting while at home. Pt was concerned about his care plan. Ch communicated with members of the care team which shared pt is awaiting results from his [facility]. Since the start of the COVID-19 restrictions, pt shared that he has been on a diet and hopes to continue on it when he is d/c. Ch encouraged pt to increase his fluid intake to stay hydrated and to share with staff if he still is experiencing any nausea. Ch discussed GOC with pt and shared with pt's wife that she Sharyn Lull [phone]) has been assigned as the HPOA and the daughter is secondary. Pt declined completing a Living Will at this time. Pt was given both the original and a copy of AD and left with his personal items in his room.  F/U: communicate with care team pt's progress    03/20/19 1100  Clinical Encounter Type  Visited With Patient and family together;Health care provider  Visit Type Follow-up;Other (Comment) (AD finalization)  Referral From Chaplain  Consult/Referral To Chaplain  Spiritual Encounters  Spiritual Needs Grief support;Emotional  Stress Factors  Patient Stress Factors Health changes;Major life changes;Loss of control  Family Stress Factors None identified  Advance Directives (For Healthcare)  Does Patient Have a Medical Advance Directive? Yes  Does patient want to make changes to medical advance directive? No - Patient declined  Type of Advance Directive [place] in Chart? No - copy requested

## 2019-03-20 NOTE — Progress Notes (Signed)
Inpatient Diabetes Program Recommendations  AACE/ADA: New Consensus Statement on Inpatient Glycemic Control  Target Ranges:  Prepandial:   less than 140 mg/dL      Peak postprandial:   less than 180 mg/dL (1-2 hours)      Critically ill patients:  140 - 180 mg/dL   Results for RITCHARD, PARAGAS (MRN [id number]) as of 03/20/2019 14:33  Ref. Range 03/19/2019 14:44 03/19/2019 17:19 03/19/2019 21:05 03/19/2019 22:16 03/20/2019 07:39 03/20/2019 12:15  Glucose-Capillary Latest Ref Range: 70 - 99 mg/dL 275 (H) 298 (H) 226 (H) 220 (H) 190 (H) 225 (H)  Results for FITZ, MATSUO (MRN [id number]) as of 03/20/2019 14:33  Ref. Range 03/19/2019 11:08  Hemoglobin A1C Latest Ref Range: 4.8 - 5.6 % 9.0 (H)   Review of Glycemic Control  Diabetes history: DM2 Outpatient Diabetes medications: Trulicity 1.5 mg Qweek (Monday), Lantus 70 units BID, Metformin 1000 mg BID, Glipizide XL 5 mg QAM Current orders for Inpatient glycemic control: Lantus 70 units BID, Novolog 0-9 units TID with meals, Novolog 0-5 units QHS   NOTE: Spoke with patient over the phone about diabetes and home regimen for diabetes control. Patient reports being followed by [facility] for diabetes management and currently taking Trulicity 1.5 mg Qweek (Monday), Lantus 70 units BID, Metformin 1000 mg BID, Glipizide XL 5 mg QAM as an outpatient for diabetes control. Patient reports taking DM medications as prescribed. Patient reports checking glucose 2 times per day and that it is usually in the 200's mg/dl and patient states it has been running high for "quite awhile".  Inquired about prior A1C and patient reports not being able to recall last A1C value. Discussed A1C results (9% on 03/19/19) and explained that current A1C indicates an average glucose of 212 mg/dl over the past 2-3 months. Discussed glucose and A1C goals. Discussed importance of checking CBGs and maintaining good CBG control to prevent long-term and short-term complications. Explained how hyperglycemia  leads to damage within blood vessels which lead to the common complications seen with uncontrolled diabetes. Patient reports that he has not been able to see a provider at the [place] in a long time due to Coronavirus and he has been taking the same DM medications and same dosages for over 6 months without any changes. Explained that if glucose is consistently staying in the 200's he likely needs some adjustments with outpatient DM medications.  Encouraged patient to call to see if he can get an appointment for follow up or at least a telephone visit to follow up on DM control. Encouraged patient to continue checking glucose 2 times per day and to keep a log book of glucose readings which he will need to take to doctor appointments.   Patient verbalized understanding of information discussed and reports no further questions at this time related to diabetes.  Thanks, Barnie Alderman, RN, MSN, CDE Diabetes Coordinator Inpatient Diabetes Program [phone] (Team Pager)

## 2019-03-20 NOTE — Plan of Care (Signed)
RD working remotely.  RD consulted for nutrition education regarding diabetes. Spoke with patient via phone. He reports checking blood sugars approximately 3 times/week with an average reading of 200. He stated that he has been on a "weird diet" since Covid; explained that meat is too expensive and he has not had any in months. Encouraged patient to consider ground [place] and chicken as they are good sources of lean meat vs red meats, and seem to be more affordable at this time. Patient requested additional information be sent that included sample meal plans. RD will include sample meals provided within the Heart Healthy Consistent Carb Education handout.  Lab Results  Component Value Date   HGBA1C 9.0 (H) 03/19/2019    RD will provide "Heart Healthy Consistent Carb" handout from the [employer] mailed to address confirmed by patient. Discussed different food groups and their effects on blood sugar, emphasizing carbohydrate-containing foods. Provided list of carbohydrates and recommended serving sizes of common foods.  Discussed importance of controlled and consistent carbohydrate intake throughout the day. Provided examples of ways to balance meals/snacks and encouraged intake of high-fiber, whole grain complex carbohydrates. Teach back method used.  Expect fair compliance.  Body mass index is 29.12 kg/m. Pt meets criteria for WNL based on current BMI.  Current diet order is HH/CHO, patient is consuming approximately 75% of meals at this time. Labs and medications reviewed. No further nutrition interventions warranted at this time. RD contact information provided. If additional nutrition issues arise, please re-consult RD.  Lajuan Lines, RD, LDN  After Hours/Weekend Pager: [phone]

## 2019-03-21 ENCOUNTER — Inpatient Hospital Stay: Payer: Medicare Other

## 2019-03-21 DIAGNOSIS — R42 Dizziness and giddiness: Secondary | ICD-10-CM

## 2019-03-21 DIAGNOSIS — R001 Bradycardia, unspecified: Secondary | ICD-10-CM

## 2019-03-21 LAB — GLUCOSE, CAPILLARY
Glucose-Capillary: 59 mg/dL — ABNORMAL LOW (ref 70–99)
Glucose-Capillary: 68 mg/dL — ABNORMAL LOW (ref 70–99)
Glucose-Capillary: 97 mg/dL (ref 70–99)
Glucose-Capillary: 143 mg/dL — ABNORMAL HIGH (ref 70–99)
Glucose-Capillary: 161 mg/dL — ABNORMAL HIGH (ref 70–99)
Glucose-Capillary: 197 mg/dL — ABNORMAL HIGH (ref 70–99)

## 2019-03-21 LAB — CBC
HCT: 39.8 % (ref 39.0–52.0)
Hemoglobin: 13.4 g/dL (ref 13.0–17.0)
MCH: 32.4 pg (ref 26.0–34.0)
MCHC: 33.7 g/dL (ref 30.0–36.0)
MCV: 96.4 fL (ref 80.0–100.0)
Platelets: 222 10*3/uL (ref 150–400)
RBC: 4.13 MIL/uL — ABNORMAL LOW (ref 4.22–5.81)
RDW: 12.8 % (ref 11.5–15.5)
WBC: 10 10*3/uL (ref 4.0–10.5)
nRBC: 0 % (ref 0.0–0.2)

## 2019-03-21 LAB — BASIC METABOLIC PANEL
Anion gap: 5 (ref 5–15)
BUN: 13 mg/dL (ref 8–23)
CO2: 24 mmol/L (ref 22–32)
Calcium: 8.3 mg/dL — ABNORMAL LOW (ref 8.9–10.3)
Chloride: 109 mmol/L (ref 98–111)
Creatinine, Ser: 0.67 mg/dL (ref 0.61–1.24)
GFR calc Af Amer: >60 mL/min (ref >60)
GFR calc non Af Amer: >60 mL/min (ref >60)
Glucose, Bld: 115 mg/dL — ABNORMAL HIGH (ref 70–99)
Potassium: 3.2 mmol/L — ABNORMAL LOW (ref 3.5–5.1)
Sodium: 138 mmol/L (ref 135–145)

## 2019-03-21 MED ORDER — GADOBUTROL 1 MMOL/ML IV SOLN
10.0000 mL | Freq: Once | INTRAVENOUS | Status: AC | PRN
  Administered 2019-03-21: 10 mL via INTRAVENOUS

## 2019-03-21 MED ORDER — INSULIN GLARGINE 100 UNIT/ML ~~LOC~~ SOLN
20.0000 [IU] | Freq: Two times a day (BID) | SUBCUTANEOUS | Status: DC
  Administered 2019-03-21 – 2019-03-27 (×12): 20 [IU] via SUBCUTANEOUS
  Filled 2019-03-21 (×15): qty 0.2

## 2019-03-21 MED ORDER — LISINOPRIL 20 MG PO TABS
40.0000 mg | ORAL_TABLET | Freq: Every day | ORAL | Status: DC
  Administered 2019-03-22 – 2019-03-27 (×6): 40 mg via ORAL
  Filled 2019-03-21: qty 2
  Filled 2019-03-21: qty 8
  Filled 2019-03-21: qty 2
  Filled 2019-03-21: qty 8
  Filled 2019-03-21: qty 2
  Filled 2019-03-21: qty 8

## 2019-03-21 MED ORDER — POTASSIUM CHLORIDE CRYS ER 20 MEQ PO TBCR
40.0000 meq | EXTENDED_RELEASE_TABLET | Freq: Once | ORAL | Status: AC
  Administered 2019-03-21: 40 meq via ORAL
  Filled 2019-03-21: qty 2

## 2019-03-21 MED ORDER — HYDROCODONE-ACETAMINOPHEN 5-325 MG PO TABS: 1.0000 | ORAL_TABLET | Freq: Two times a day (BID) | ORAL | Status: DC | PRN

## 2019-03-21 NOTE — Progress Notes (Signed)
CBG at 0300 was 59. Pt given orange juice and encouraged to eat his rice crispy treat at the bedside. On recheck, sugars were 68 and pt was still eating the treat and had some OJ left. Pt has no concerns or distress noted.

## 2019-03-21 NOTE — Progress Notes (Signed)
Patient is stable this shift,hypokalemic and kcl given,no pain issues,carotid ultrasound done,iv fluids continues

## 2019-03-21 NOTE — Progress Notes (Signed)
Pt CBG at HS was 150. Pt consulted on his Lantus due to the dose being 70 units. Per pt, he stated that he dosed himself at home based on his blood sugar reading and if his CBG was 150 he would only give himself 50 units. MD Jannifer Franklin and pharmacy made aware, HS dose reduced to 50 units with a 3 am CBG check. Pt has a sugary snack at bedside. Pt educated on calling if he has symptoms of blood sugar changes

## 2019-03-21 NOTE — Consult Note (Signed)
Reason for Consult:Abnormal CT scan Referring Physician: Gouru  CC: Nausea and vomiting.    HPI: Mathew Baker is an 78 y.o. male who reports that about 2 weeks ago he began to experience dizziness.  Felt he was off balance when walking as well.  This progressed and a couple of days prior to admission he developed nausea and vomiting that was intractable.  Patient presented to the ED and was admitted.  Patient noted to be bradycardic.  Imaging today shows abnormal cerebellum and consult called for further recommendations.    Past Medical History:  Diagnosis Date  . Chronic airway obstruction ([place])   . Degeneration of intervertebral disc of lumbar region   . Diabetes mellitus without complication ([place])   . Essential hypertension, benign   . Hyperlipidemia   . Hypertension   . Osteoarthritis of lower extremity   . Pneumonia, organism unspecified([place])   . PSA elevation 07/16/2013   Normal    Past Surgical History:  Procedure Laterality Date  . CATARACT EXTRACTION W/ INTRAOCULAR LENS  IMPLANT, BILATERAL    . COLONOSCOPY  2006   Normal: Repeat in 10 yrs    Family History  Problem Relation Age of Onset  . Diabetes Mother     Social History:  reports that he quit smoking about 60 years ago. He has never used smokeless tobacco. He reports that he does not drink alcohol or use drugs.  No Known Allergies  Medications:  I have reviewed the patient's current medications. Prior to Admission:  Medications Prior to Admission  Medication Sig Dispense Refill Last Dose  . albuterol (PROVENTIL HFA;VENTOLIN HFA) 108 (90 Base) MCG/ACT inhaler Inhale 2 puffs into the lungs 4 (four) times daily as needed for wheezing or shortness of breath.   Unknown at PRN  . amLODipine (NORVASC) 10 MG tablet Take 10 mg by mouth daily.   Past Week at Unknown time  . atorvastatin (LIPITOR) 80 MG tablet Take 40 mg by mouth at bedtime.   Past Week at Unknown time  . Dulaglutide (TRULICITY) 1.5 DJ/2.4QA SOPN  Inject 1.5 mg into the skin every 7 (seven) days.   As directed at As directed  . glucose blood (PRECISION XTRA TEST STRIPS) test strip Use to test blood sugar 2 times daily as instructed. Dx: E11.65 400 each 3   . HYDROcodone-acetaminophen (NORCO/VICODIN) 5-325 MG tablet Take 1 tablet by mouth 2 (two) times a day.    Past Week at Unknown time  . insulin glargine (LANTUS) 100 UNIT/ML injection Inject 0.7 mLs (70 Units total) into the skin 2 (two) times daily. 120 vial 1 03/19/2019 at 0800  . Insulin Pen Needle (CAREFINE PEN NEEDLES) 32G X 4 MM MISC Use 1x a day 100 each 2   . latanoprost (XALATAN) 0.005 % ophthalmic solution Place 1 drop into both eyes at bedtime.   Past Week at Unknown time  . lisinopril (ZESTRIL) 40 MG tablet Take 40 mg by mouth daily.   Past Week at Unknown time  . Magnesium Oxide 420 MG TABS Take 420 mg by mouth daily.    Past Week at Unknown time  . metFORMIN (GLUCOPHAGE) 1000 MG tablet Take 1 tablet (1,000 mg total) by mouth 2 (two) times daily with a meal. 180 tablet 1 Past Week at Unknown time  . sildenafil (VIAGRA) 100 MG tablet Take 100 mg by mouth daily as needed for erectile dysfunction.   Unknown at PRN  . tamsulosin (FLOMAX) 0.4 MG CAPS capsule Take 0.4 mg by  mouth at bedtime.   Past Week at Unknown time  . tiotropium (SPIRIVA) 18 MCG inhalation capsule Place 18 mcg into inhaler and inhale daily.    Past Week at Unknown time  . glipiZIDE (GLUCOTROL XL) 5 MG 24 hr tablet Take 1 tablet (5 mg total) by mouth daily with breakfast. (Patient not taking: Reported on 12/20/2016) 30 tablet 5 Not Taking at Unknown time  . guaiFENesin (MUCINEX) 600 MG 12 hr tablet Take 1 tablet (600 mg total) by mouth 2 (two) times daily. (Patient not taking: Reported on 03/19/2019) 30 tablet 0 Not Taking at Unknown time  . lisinopril (PRINIVIL,ZESTRIL) 30 MG tablet Take 1 tablet (30 mg total) by mouth daily. (Patient not taking: Reported on 03/19/2019) 30 tablet 0 Not Taking at Unknown time    Scheduled: . aspirin EC  81 mg Oral Daily  . atorvastatin  40 mg Oral QHS  . enoxaparin (LOVENOX) injection  40 mg Subcutaneous Q24H  . gabapentin  300 mg Oral QHS  . guaiFENesin  600 mg Oral BID  . insulin aspart  0-5 Units Subcutaneous QHS  . insulin aspart  0-9 Units Subcutaneous TID WC  . insulin glargine  20 Units Subcutaneous BID  . latanoprost  1 drop Both Eyes QHS  . [START ON 03/22/2019] lisinopril  40 mg Oral Daily  . tamsulosin  0.4 mg Oral QHS  . tiotropium  18 mcg Inhalation Once per day on Mon Wed Fri    ROS: History obtained from the patient  General ROS: negative for - chills, fatigue, fever, night sweats, weight gain or weight loss Psychological ROS: negative for - behavioral disorder, hallucinations, memory difficulties, mood swings or suicidal ideation Ophthalmic ROS: negative for - blurry vision, double vision, eye pain or loss of vision ENT ROS: vertigo Allergy and Immunology ROS: negative for - hives or itchy/watery eyes Hematological and Lymphatic ROS: negative for - bleeding problems, bruising or swollen lymph nodes Endocrine ROS: negative for - galactorrhea, hair pattern changes, polydipsia/polyuria or temperature intolerance Respiratory ROS: negative for - cough, hemoptysis, shortness of breath or wheezing Cardiovascular ROS: negative for - chest pain, dyspnea on exertion, edema or irregular heartbeat Gastrointestinal ROS: nausea/vomiting Genito-Urinary ROS: negative for - dysuria, hematuria, incontinence or urinary frequency/urgency Musculoskeletal ROS: negative for - joint swelling or muscular weakness Neurological ROS: as noted in HPI Dermatological ROS: negative for rash and skin lesion changes  Physical Examination: Blood pressure (!) 149/68, pulse (!) 51, temperature 98.4 F (36.9 C), temperature source Oral, resp. rate 18, height 6\' 1"  (1.854 m), weight 100.1 kg, SpO2 98 %.  HEENT-  Normocephalic, no lesions, without obvious abnormality.   Normal external eye and conjunctiva.  Normal TM's bilaterally.  Normal auditory canals and external ears. Normal external nose, mucus membranes and septum.  Normal pharynx. Cardiovascular- S1, S2 normal, pulses palpable throughout   Lungs- chest clear, no wheezing, rales, normal symmetric air entry, Heart exam - S1, S2 normal, no murmur, no gallop, rate regular Abdomen- soft, non-tender; bowel sounds normal; no masses,  no organomegaly Extremities- no edema Lymph-no adenopathy palpable Musculoskeletal-no joint tenderness, deformity or swelling Skin-warm and dry, no hyperpigmentation, vitiligo, or suspicious lesions  Neurological Examination   Mental Status: Alert, oriented, thought content appropriate.  Speech fluent without evidence of aphasia.  Able to follow 3 step commands without difficulty. Cranial Nerves: II: Discs flat bilaterally; Visual fields grossly normal, pupils equal, round, reactive to light and accommodation III,IV, VI: ptosis not present, extra-ocular motions intact bilaterally V,VII: smile  symmetric, facial light touch sensation normal bilaterally VIII: hearing normal bilaterally IX,X: gag reflex present XI: bilateral shoulder shrug XII: midline tongue extension Motor: Right : Upper extremity   5/5    Left:     Upper extremity   5/5  Lower extremity   5/5     Lower extremity   5/5 Tone and bulk:normal tone throughout; no atrophy noted Sensory: Pinprick and light touch intact throughout, bilaterally Deep Tendon Reflexes: 2+ and symmetric throughout Plantars: Right: mute   Left: mute Cerebellar: Normal finger-to-nose and normal heel-to-shin testing bilaterally Gait: not tested due to safety concerns    Laboratory Studies:   Basic Metabolic Panel: Recent Labs  Lab 03/19/19 1108 03/20/19 0535 03/21/19 0459  NA 136 137 138  K 3.8 3.7 3.2*  CL 102 106 109  CO2 24 21* 24  GLUCOSE 339* 195* 115*  BUN 15 16 13   CREATININE 0.79 0.69 0.67  CALCIUM 9.0 8.5* 8.3*   MG 1.8  --   --     Liver Function Tests: Recent Labs  Lab 03/19/19 1108  AST 22  ALT 21  ALKPHOS 103  BILITOT 1.0  PROT 7.5  ALBUMIN 4.0   Recent Labs  Lab 03/19/19 1108  LIPASE 40   No results for input(s): AMMONIA in the last 168 hours.  CBC: Recent Labs  Lab 03/19/19 1108 03/20/19 0535 03/21/19 0459  WBC 11.8* 13.9* 10.0  NEUTROABS 9.1*  --   --   HGB 14.6 13.4 13.4  HCT 42.6 38.1* 39.8  MCV 96.4 95.7 96.4  PLT 214 224 222    Cardiac Enzymes: No results for input(s): CKTOTAL, CKMB, CKMBINDEX, TROPONINI in the last 168 hours.  BNP: Invalid input(s): POCBNP  CBG: Recent Labs  Lab 03/20/19 2128 03/21/19 0347 03/21/19 0418 03/21/19 0800 03/21/19 1137  GLUCAP 150* 59* 68* 97 143*    Microbiology: Results for orders placed or performed during the hospital encounter of 03/19/19  SARS Coronavirus 2 (CEPHEID - Performed in [place] hospital lab), Hosp Order     Status: None   Collection Time: 03/19/19 11:09 AM   Specimen: Nasopharyngeal Swab  Result Value Ref Range Status   SARS Coronavirus 2 NEGATIVE NEGATIVE Final    Comment: (NOTE) If result is NEGATIVE SARS-CoV-2 target nucleic acids are NOT DETECTED. The SARS-CoV-2 RNA is generally detectable in upper and lower  respiratory specimens during the acute phase of infection. The lowest  concentration of SARS-CoV-2 viral copies this assay can detect is 250  copies / mL. A negative result does not preclude SARS-CoV-2 infection  and should not be used as the sole basis for treatment or other  patient management decisions.  A negative result may occur with  improper specimen collection / handling, submission of specimen other  than nasopharyngeal swab, presence of viral mutation(s) within the  areas targeted by this assay, and inadequate number of viral copies  (<250 copies / mL). A negative result must be combined with clinical  observations, patient history, and epidemiological information. If  result is POSITIVE SARS-CoV-2 target nucleic acids are DETECTED. The SARS-CoV-2 RNA is generally detectable in upper and lower  respiratory specimens dur ing the acute phase of infection.  Positive  results are indicative of active infection with SARS-CoV-2.  Clinical  correlation with patient history and other diagnostic information is  necessary to determine patient infection status.  Positive results do  not rule out bacterial infection or co-infection with other viruses. If result is PRESUMPTIVE POSTIVE  SARS-CoV-2 nucleic acids MAY BE PRESENT.   A presumptive positive result was obtained on the submitted specimen  and confirmed on repeat testing.  While 2019 novel coronavirus  (SARS-CoV-2) nucleic acids may be present in the submitted sample  additional confirmatory testing may be necessary for epidemiological  and / or clinical management purposes  to differentiate between  SARS-CoV-2 and other Sarbecovirus currently known to infect humans.  If clinically indicated additional testing with an alternate test  methodology [phone]) is advised. The SARS-CoV-2 RNA is generally  detectable in upper and lower respiratory sp ecimens during the acute  phase of infection. The expected result is Negative. Fact Sheet for Patients:  StrictlyIdeas.no Fact Sheet for Healthcare Providers: BankingDealers.co.za This test is not yet approved or cleared by the [place] FDA and has been authorized for detection and/or diagnosis of SARS-CoV-2 by FDA under an Emergency Use Authorization (EUA).  This EUA will remain in effect (meaning this test can be used) for the duration of the COVID-19 declaration under Section 564(b)(1) of the Act, 21 U.S.C. section 360bbb-3(b)(1), unless the authorization is terminated or revoked sooner. Performed at [facility], [address] [place], [place] [address]     Coagulation Studies: Recent Labs     03/19/19 1108  LABPROT 12.5  INR 0.9    Urinalysis: No results for input(s): COLORURINE, LABSPEC, PHURINE, GLUCOSEU, HGBUR, BILIRUBINUR, KETONESUR, PROTEINUR, UROBILINOGEN, NITRITE, LEUKOCYTESUR in the last 168 hours.  Invalid input(s): APPERANCEUR  Lipid Panel:     Component Value Date/Time   CHOL 104 08/22/2015 0930   TRIG 216.0 (H) 08/22/2015 0930   HDL 34.70 (L) 08/22/2015 0930   CHOLHDL 3 08/22/2015 0930   VLDL 43.2 (H) 08/22/2015 0930   LDLCALC 24 06/03/2014    HgbA1C:  Lab Results  Component Value Date   HGBA1C 9.0 (H) 03/19/2019    Urine Drug Screen:  No results found for: LABOPIA, COCAINSCRNUR, LABBENZ, AMPHETMU, THCU, LABBARB  Alcohol Level: No results for input(s): ETH in the last 168 hours.   Imaging: Ct Head Wo Contrast  Result Date: 03/20/2019 CLINICAL DATA:  Dizziness, nausea, vomiting. EXAM: CT HEAD WITHOUT CONTRAST TECHNIQUE: Contiguous axial images were obtained from the base of the skull through the vertex without intravenous contrast. COMPARISON:  12/20/2016 FINDINGS: Brain: There is low-density/edema noted in the midline of the cerebellum. 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 However, given the midline nature, recommend MRI for further evaluation. No hydrocephalus. Mild cerebral atrophy and chronic small vessel disease throughout the deep white matter. No hemorrhage. Vascular: No hyperdense vessel or unexpected calcification. Skull: No acute calvarial abnormality. Sinuses/Orbits: Visualized paranasal sinuses and mastoids clear. Orbital soft tissues unremarkable. 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 Given the unusual vascular pattern, recommend MRI with and without contrast for further evaluation. Electronically Signed   By: Rolm Baptise M.D.   On: 03/20/2019 19:24   US Carotid Bilateral  Result Date: 03/21/2019 CLINICAL DATA:  Dizziness,  hypertension, hyperlipidemia, diabetes, previous tobacco abuse EXAM: BILATERAL CAROTID DUPLEX ULTRASOUND TECHNIQUE: Pearline Cables scale imaging, color Doppler and duplex ultrasound were performed of bilateral carotid and vertebral arteries in the neck. COMPARISON:  None. FINDINGS: Criteria: Quantification of carotid stenosis is based on velocity parameters that correlate the residual internal carotid diameter with NASCET-based stenosis levels, using the diameter of the distal internal carotid lumen as the denominator for stenosis measurement. The following velocity measurements were obtained: RIGHT  ICA: 131/17 cm/sec CCA: 945/85 cm/sec SYSTOLIC ICA/CCA RATIO:  1.2 ECA: 136 cm/sec LEFT ICA: 82/12 cm/sec CCA: 92/92 cm/sec SYSTOLIC ICA/CCA RATIO:  0.9 ECA: 167 cm/sec RIGHT CAROTID ARTERY: Eccentric calcified plaque in the bulb and ICA origin resulting in at least mild stenosis. Normal waveforms and color Doppler signal. RIGHT VERTEBRAL ARTERY:  Normal flow direction and waveform. LEFT CAROTID ARTERY: Mild eccentric partially calcified plaque at the bifurcation and in the proximal ICA. No high-grade stenosis. Normal waveforms and color Doppler signal. LEFT VERTEBRAL ARTERY:  Normal flow direction and waveform. IMPRESSION: 1. 1. 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 2. Antegrade bilateral vertebral arterial flow. Electronically Signed   By: Lucrezia Europe M.D.   On: 03/21/2019 16:18     Assessment/Plan: 78 year old male admitted with nausea, vomiting and bradycardia.  Currently patient stable without nausea and vomiting.  Nonfocal neurological examination.  HR 51.  Head CT reviewed and shows an area of midline cerebellar hypodensity consistent with edema.  Etiology unclear.  No evidence of hydrocephalus but may very well be the etiology of the patient's symptoms.  Further work up recommended.  Recommendations: 1. Would perform MRI of the brain with and without contrast as  recommended by radiology.  Would perform MRA of the brain as well to evaluate posterior circulation. 2. Frequent neuro checks 3. Telemetry 4. Patient now stable.  If patient decompensates would have low threshold for repeat imaging to rule out hydrocephalus and further edema at which time transfer would be necessary.    Alexis Goodell, MD Neurology [phone] 03/21/2019, 4:21 PM

## 2019-03-21 NOTE — Progress Notes (Signed)
[facility]  SUBJECTIVE: [name] is a 78 year old male with a past medical history significant for type 2 diabetes, COPD, hypertension, and hyperlipidemia who presented to the ED on 03/19/19 for a 3 week history of intermittent dizziness and near syncopal episodes as well as a one day history of nausea/vomiting. Workup in the ED was significant for borderline elevated WBC count and an elevated lactic acid, 2.3. COVID negative. High sensitivity troponin negative. In the ED, he was noted to be bradycardic with rates in the 30s, so he was admitted for further workup.   Today, [name] admits to nausea, dizziness, and constipation.  Last bowel movement was 4 days ago.  He denies chest pain, shortness of breath, palpitations, heart racing sensation, orthopnea, PND, or lower extremity swelling.     Vitals:   03/20/19 1924 03/21/19 0344 03/21/19 0346 03/21/19 0800  BP: 140/79 (!) 165/77 (!) 153/64 (!) 149/68  Pulse: (!) 59 (!) 54 (!) 51 (!) 51  Resp:      Temp: 98 F (36.7 C) 98 F (36.7 C)  98.4 F (36.9 C)  TempSrc: Oral Oral  Oral  SpO2: 98% 99% 98% 98%  Weight:      Height:         Intake/Output Summary (Last 24 hours) at 03/21/2019 0835 Last data filed at 03/21/2019 0424 Gross per 24 hour  Intake 1137.4 ml  Output 1225 ml  Net -87.6 ml      PHYSICAL EXAM  General: Well developed, well nourished, in no acute distress HEENT:  Normocephalic and atramatic Neck:  No JVD.  Lungs: Clear bilaterally to auscultation and percussion. Heart: HRRR . 2/6 crescendo decrescendo systolic murmur appreciated. No gallops or rubs Abdomen: Bowel sounds are positive, abdomen soft and non-tender  Msk:  Back normal. Normal strength and tone for age. Extremities: No clubbing, cyanosis or edema.   Neuro: Alert and oriented X 3. Psych:  Good affect, responds appropriately   LABS: Basic Metabolic Panel: Recent Labs    03/19/19 1108 03/20/19 0535 03/21/19 0459  NA 136 137 138  K 3.8 3.7  3.2*  CL 102 106 109  CO2 24 21* 24  GLUCOSE 339* 195* 115*  BUN 15 16 13   CREATININE 0.79 0.69 0.67  CALCIUM 9.0 8.5* 8.3*  MG 1.8  --   --    Liver Function Tests: Recent Labs    03/19/19 1108  AST 22  ALT 21  ALKPHOS 103  BILITOT 1.0  PROT 7.5  ALBUMIN 4.0   Recent Labs    03/19/19 1108  LIPASE 40   CBC: Recent Labs    03/19/19 1108 03/20/19 0535 03/21/19 0459  WBC 11.8* 13.9* 10.0  NEUTROABS 9.1*  --   --   HGB 14.6 13.4 13.4  HCT 42.6 38.1* 39.8  MCV 96.4 95.7 96.4  PLT 214 224 222   Cardiac Enzymes: No results for input(s): CKTOTAL, CKMB, CKMBINDEX, TROPONINI in the last 72 hours. BNP: Invalid input(s): POCBNP D-Dimer: No results for input(s): DDIMER in the last 72 hours. Hemoglobin A1C: Recent Labs    03/19/19 1108  HGBA1C 9.0*   Fasting Lipid Panel: No results for input(s): CHOL, HDL, LDLCALC, TRIG, CHOLHDL, LDLDIRECT in the last 72 hours. Thyroid Function Tests: Recent Labs    03/19/19 1314  TSH 2.453   Anemia Panel: No results for input(s): VITAMINB12, FOLATE, FERRITIN, TIBC, IRON, RETICCTPCT in the last 72 hours.  Ct Head Wo Contrast  Result Date: 03/20/2019 CLINICAL DATA:  Dizziness, nausea, vomiting. EXAM: CT HEAD WITHOUT CONTRAST TECHNIQUE: Contiguous axial images were obtained from the base of the skull through the vertex without intravenous contrast. COMPARISON:  12/20/2016 FINDINGS: Brain: There is low-density/edema noted in the midline of the cerebellum. 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 However, given the midline nature, recommend MRI for further evaluation. No hydrocephalus. Mild cerebral atrophy and chronic small vessel disease throughout the deep white matter. No hemorrhage. Vascular: No hyperdense vessel or unexpected calcification. Skull: No acute calvarial abnormality. Sinuses/Orbits: Visualized paranasal sinuses and mastoids clear. Orbital soft tissues unremarkable. 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 Given the unusual vascular pattern, recommend MRI with and without contrast for further evaluation. Electronically Signed   By: Rolm Baptise M.D.   On: 03/20/2019 19:24     Echo:  Normal LV function with an EF estimated between 60-65% with moderate LVH, sclerotic aortic valve with mild AS   TELEMETRY: Normal sinus rhythm, rate 61bpm   ASSESSMENT AND PLAN:  Active Problems:   Bradycardia    1.  Bradycardia   -Rate is currently fluctuating in the upper 50s and lower 60s; echocardiogram revealed normal LV function with an EF estimated between 60-65% with moderate LVH. Aortic valve was severely calcified with mild aortic stenosis, valve area calculated at 1.71  -No further inpatient cardiac workup indicated at this time  2.  Sclerotic aortic valve   -Mild aortic stenosis per echocardiogram; recommend to establish care with cardiology in an outpatient setting for routine monitoring  3.  Dizziness/near syncope   -Head CT revealed midline edema within the cerebellum  -Carotid ultrasound pending   No further cardiac diagnostics indicated at this time, signing off for now   The history, physical exam findings, and plan of care were all discussed with Dr. Bartholome Bill, and all decision making was made in collaboration.   [name]  PA-C 03/21/2019 8:35 AM

## 2019-03-21 NOTE — Progress Notes (Signed)
[facility] NAME: Nole Robey    MR#:  [id number]  DATE OF BIRTH:  [birth year]  SUBJECTIVE:  CHIEF COMPLAINT: Patient nausea vomiting significantly improved and feels better.  Intermittent episodes of dizziness  REVIEW OF SYSTEMS:  CONSTITUTIONAL: No fever, fatigue or weakness.  EYES: No blurred or double vision.  EARS, NOSE, AND THROAT: No tinnitus or ear pain.  RESPIRATORY: No cough, shortness of breath, wheezing or hemoptysis.  CARDIOVASCULAR: No chest pain, orthopnea, edema.  GASTROINTESTINAL: No nausea, vomiting, diarrhea or abdominal pain.  GENITOURINARY: No dysuria, hematuria.  ENDOCRINE: No polyuria, nocturia,  HEMATOLOGY: No anemia, easy bruising or bleeding SKIN: No rash or lesion. MUSCULOSKELETAL: No joint pain or arthritis.   NEUROLOGIC: No tingling, numbness, weakness.  PSYCHIATRY: No anxiety or depression.   DRUG ALLERGIES:  No Known Allergies  VITALS:  Blood pressure (!) 149/68, pulse (!) 51, temperature 98.4 F (36.9 C), temperature source Oral, resp. rate 18, height 6\' 1"  (1.854 m), weight 100.1 kg, SpO2 98 %.  PHYSICAL EXAMINATION:  GENERAL:  78 y.o.-year-old patient lying in the bed with no acute distress.  EYES: Pupils equal, round, reactive to light and accommodation. No scleral icterus. Extraocular muscles intact.  HEENT: Head atraumatic, normocephalic. Oropharynx and nasopharynx clear.  NECK:  Supple, no jugular venous distention. No thyroid enlargement, no tenderness.  LUNGS: Normal breath sounds bilaterally, no wheezing, rales,rhonchi or crepitation. No use of accessory muscles of respiration.  CARDIOVASCULAR: S1, S2 normal. No murmurs, rubs, or gallops.  ABDOMEN: Soft, nontender, nondistended. Bowel sounds present.  EXTREMITIES: No pedal edema, cyanosis, or clubbing.  NEUROLOGIC: Cranial nerves II through XII are intact. Muscle strength 5/5 in all extremities. Sensation intact. Gait not  checked.  PSYCHIATRIC: The patient is alert and oriented x 3.  SKIN: No obvious rash, lesion, or ulcer.    LABORATORY PANEL:   CBC Recent Labs  Lab 03/21/19 0459  WBC 10.0  HGB 13.4  HCT 39.8  PLT 222   ------------------------------------------------------------------------------------------------------------------  Chemistries  Recent Labs  Lab 03/19/19 1108  03/21/19 0459  NA 136   < > 138  K 3.8   < > 3.2*  CL 102   < > 109  CO2 24   < > 24  GLUCOSE 339*   < > 115*  BUN 15   < > 13  CREATININE 0.79   < > 0.67  CALCIUM 9.0   < > 8.3*  MG 1.8  --   --   AST 22  --   --   ALT 21  --   --   ALKPHOS 103  --   --   BILITOT 1.0  --   --    < > = values in this interval not displayed.   ------------------------------------------------------------------------------------------------------------------  Cardiac Enzymes No results for input(s): TROPONINI in the last 168 hours. ------------------------------------------------------------------------------------------------------------------  RADIOLOGY:  Ct Head Wo Contrast  Result Date: 03/20/2019 CLINICAL DATA:  Dizziness, nausea, vomiting. EXAM: CT HEAD WITHOUT CONTRAST TECHNIQUE: Contiguous axial images were obtained from the base of the skull through the vertex without intravenous contrast. COMPARISON:  12/20/2016 FINDINGS: Brain: There is low-density/edema noted in the midline of the cerebellum. 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 However, given the midline nature, recommend MRI for further evaluation. No hydrocephalus. Mild cerebral atrophy and chronic small vessel disease throughout the deep white matter. No hemorrhage. Vascular: No hyperdense vessel or unexpected  calcification. Skull: No acute calvarial abnormality. Sinuses/Orbits: Visualized paranasal sinuses and mastoids clear. Orbital soft tissues unremarkable. 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 Given the unusual vascular pattern, recommend MRI with and without contrast for further evaluation. Electronically Signed   By: Rolm Baptise M.D.   On: 03/20/2019 19:24   US Carotid Bilateral  Result Date: 03/21/2019 CLINICAL DATA:  Dizziness, hypertension, hyperlipidemia, diabetes, previous tobacco abuse EXAM: BILATERAL CAROTID DUPLEX ULTRASOUND TECHNIQUE: Pearline Cables scale imaging, color Doppler and duplex ultrasound were performed of bilateral carotid and vertebral arteries in the neck. COMPARISON:  None. FINDINGS: Criteria: Quantification of carotid stenosis is based on velocity parameters that correlate the residual internal carotid diameter with NASCET-based stenosis levels, using the diameter of the distal internal carotid lumen as the denominator for stenosis measurement. The following velocity measurements were obtained: RIGHT ICA: 131/17 cm/sec CCA: 009/23 cm/sec SYSTOLIC ICA/CCA RATIO:  1.2 ECA: 136 cm/sec LEFT ICA: 82/12 cm/sec CCA: 30/07 cm/sec SYSTOLIC ICA/CCA RATIO:  0.9 ECA: 167 cm/sec RIGHT CAROTID ARTERY: Eccentric calcified plaque in the bulb and ICA origin resulting in at least mild stenosis. Normal waveforms and color Doppler signal. RIGHT VERTEBRAL ARTERY:  Normal flow direction and waveform. LEFT CAROTID ARTERY: Mild eccentric partially calcified plaque at the bifurcation and in the proximal ICA. No high-grade stenosis. Normal waveforms and color Doppler signal. LEFT VERTEBRAL ARTERY:  Normal flow direction and waveform. IMPRESSION: 1. 1. 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 2. Antegrade bilateral vertebral arterial flow. Electronically Signed   By: Lucrezia Europe M.D.   On: 03/21/2019 16:18    EKG:   Orders placed or performed during the hospital encounter of 12/20/16  . ED EKG  . ED EKG  . EKG 12-Lead  . EKG 12-Lead  . ED EKG  . ED EKG  . EKG 12-Lead  . EKG 12-Lead  . EKG  . 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 Doy Mince she is aware   #.   Dizziness probably Symptomatic bradycardia patient does not appear to be on any heart  rate lowering medications Asymptomatic Cardiology is following, heart rate is stable at around 50s We will monitor on telemetry Echocardiogram of the heart- -stress test in May 2018 has revealed normal LV function with EF estimated at 57% no evidence of stress-induced ischemia -Carotid Dopplers with no significant stenosis  2.  Diabetes type 2 we will continue Lantus hold metformin due to nausea, I will also hold Glucotrol XL Dietitian consult for diet education  3.  Hyperlipidemia continue Lipitor  4.  Glaucoma continue eyedrops  5.  Hypertension continue lisinopril monitor blood pressure  6.  Acute gastritis we will check GI panel PPI.  Check for H. pylori  PT consult  All the records are reviewed and case discussed with Care Management/Social Workerr. Management plans discussed with the patient, try to reach patient's wife at [phone] could not reach her and voicemail box set up  CODE STATUS: fc  TOTAL TIME TAKING CARE OF THIS PATIENT: 36  minutes.   POSSIBLE D/C IN  DAYS, DEPENDING ON CLINICAL CONDITION.  Note: This dictation was prepared with Dragon dictation along with smaller phrase technology. Any transcriptional errors that result from this process  are unintentional.   Nicholes Mango M.D on 03/21/2019 at 4:22 PM  Between 7am to 6pm - Pager - [phone] After 6pm go to [email or web address] - password [email or web address] [facility]  Office  [phone]  CC: Primary care physician; Patient, No Pcp Per

## 2019-03-22 ENCOUNTER — Inpatient Hospital Stay: Payer: Medicare Other

## 2019-03-22 DIAGNOSIS — I639 Cerebral infarction, unspecified: Secondary | ICD-10-CM

## 2019-03-22 LAB — BASIC METABOLIC PANEL
Anion gap: 8 (ref 5–15)
BUN: 14 mg/dL (ref 8–23)
CO2: 22 mmol/L (ref 22–32)
Calcium: 8.6 mg/dL — ABNORMAL LOW (ref 8.9–10.3)
Chloride: 106 mmol/L (ref 98–111)
Creatinine, Ser: 0.64 mg/dL (ref 0.61–1.24)
GFR calc Af Amer: >60 mL/min (ref >60)
GFR calc non Af Amer: >60 mL/min (ref >60)
Glucose, Bld: 167 mg/dL — ABNORMAL HIGH (ref 70–99)
Potassium: 3.5 mmol/L (ref 3.5–5.1)
Sodium: 136 mmol/L (ref 135–145)

## 2019-03-22 LAB — CBC
HCT: 38.3 % — ABNORMAL LOW (ref 39.0–52.0)
Hemoglobin: 13.2 g/dL (ref 13.0–17.0)
MCH: 33 pg (ref 26.0–34.0)
MCHC: 34.5 g/dL (ref 30.0–36.0)
MCV: 95.8 fL (ref 80.0–100.0)
Platelets: 218 10*3/uL (ref 150–400)
RBC: 4 MIL/uL — ABNORMAL LOW (ref 4.22–5.81)
RDW: 12.7 % (ref 11.5–15.5)
WBC: 10.6 10*3/uL — ABNORMAL HIGH (ref 4.0–10.5)
nRBC: 0 % (ref 0.0–0.2)

## 2019-03-22 LAB — GLUCOSE, CAPILLARY
Glucose-Capillary: 170 mg/dL — ABNORMAL HIGH (ref 70–99)
Glucose-Capillary: 150 mg/dL — ABNORMAL HIGH (ref 70–99)
Glucose-Capillary: 190 mg/dL — ABNORMAL HIGH (ref 70–99)
Glucose-Capillary: 240 mg/dL — ABNORMAL HIGH (ref 70–99)
Glucose-Capillary: 137 mg/dL — ABNORMAL HIGH (ref 70–99)

## 2019-03-22 LAB — H. PYLORI BREATH TEST: H. pylori UBiT: NEGATIVE

## 2019-03-22 MED ORDER — DOCUSATE SODIUM 100 MG PO CAPS
100.0000 mg | ORAL_CAPSULE | Freq: Every day | ORAL | Status: DC
  Administered 2019-03-22 – 2019-03-27 (×6): 100 mg via ORAL
  Filled 2019-03-22 (×6): qty 1

## 2019-03-22 MED ORDER — MAGNESIUM OXIDE 400 (241.3 MG) MG PO TABS
400.0000 mg | ORAL_TABLET | Freq: Every day | ORAL | Status: DC
  Administered 2019-03-22 – 2019-03-27 (×6): 400 mg via ORAL
  Filled 2019-03-22 (×6): qty 1

## 2019-03-22 MED ORDER — PROCHLORPERAZINE EDISYLATE 10 MG/2ML IJ SOLN
10.0000 mg | Freq: Four times a day (QID) | INTRAMUSCULAR | Status: DC | PRN
  Administered 2019-03-22: 10 mg via INTRAVENOUS
  Filled 2019-03-22 (×3): qty 2

## 2019-03-22 MED ORDER — CHLORHEXIDINE GLUCONATE CLOTH 2 % EX PADS
6.0000 | MEDICATED_PAD | Freq: Every day | CUTANEOUS | Status: DC
  Administered 2019-03-23 – 2019-03-26 (×2): 6 via TOPICAL

## 2019-03-22 MED ORDER — ONDANSETRON HCL 4 MG PO TABS
4.0000 mg | ORAL_TABLET | Freq: Once | ORAL | Status: AC
  Administered 2019-03-22: 11:00:00 4 mg via ORAL

## 2019-03-22 MED ORDER — HYDRALAZINE HCL 20 MG/ML IJ SOLN
10.0000 mg | Freq: Four times a day (QID) | INTRAMUSCULAR | Status: DC | PRN
  Administered 2019-03-22: 10 mg via INTRAVENOUS
  Filled 2019-03-22: qty 1

## 2019-03-22 MED ORDER — ONDANSETRON 4 MG PO TBDP: 4.0000 mg | ORAL_TABLET | Freq: Once | ORAL | Status: DC

## 2019-03-22 MED ORDER — POTASSIUM CHLORIDE 20 MEQ PO PACK
40.0000 meq | PACK | Freq: Two times a day (BID) | ORAL | Status: AC
  Administered 2019-03-22 (×2): 40 meq via ORAL
  Filled 2019-03-22 (×2): qty 2

## 2019-03-22 MED ORDER — ONDANSETRON HCL 4 MG/5ML PO SOLN: 4.0000 mg | Freq: Once | ORAL | Status: DC

## 2019-03-22 NOTE — Progress Notes (Signed)
Subjective: Patient remains stable.  Awake and alert.    Objective: Current vital signs: BP 136/75   Pulse 73   Temp 98.6 F (37 C) (Oral)   Resp 16   Ht 6\' 1"  (1.854 m)   Wt 100.1 kg   SpO2 98%   BMI 29.12 kg/m  Vital signs in last 24 hours: Temp:  [97.8 F (36.6 C)-98.7 F (37.1 C)] 98.6 F (37 C) (07/16 0800) Pulse Rate:  [53-74] 73 (07/16 1000) Resp:  [14-19] 16 (07/16 1000) BP: (136-172)/(66-84) 136/75 (07/16 1000) SpO2:  [95 %-99 %] 98 % (07/16 1000)  Intake/Output from previous day: 07/15 0701 - 07/16 0700 In: 2084.1 [P.O.:720; I.V.:1364.1] Out: 925 [Urine:925] Intake/Output this shift: Total I/O In: 240 [P.O.:240] Out: -  Nutritional status:  Diet Order            Diet heart healthy/carb modified Room service appropriate? Yes; Fluid consistency: Thin  Diet effective now              Neurologic Exam: Mental Status: Alert, oriented, thought content appropriate.  Speech fluent without evidence of aphasia.  Able to follow 3 step commands without difficulty. Cranial Nerves: II: Discs flat bilaterally; Visual fields grossly normal, pupils equal, round, reactive to light and accommodation III,IV, VI: ptosis not present, extra-ocular motions intact bilaterally V,VII: smile symmetric, facial light touch sensation normal bilaterally VIII: hearing normal bilaterally IX,X: gag reflex present XI: bilateral shoulder shrug XII: midline tongue extension Motor: Right : Upper extremity   4/5    Left:     Upper extremity   5/5  Lower extremity   5/5     Lower extremity   5/5 Tone and bulk:normal tone throughout; no atrophy noted Sensory: Pinprick and light touch intact throughout, bilaterally    Lab Results: Basic Metabolic Panel: Recent Labs  Lab 03/19/19 1108 03/20/19 0535 03/21/19 0459 03/22/19 0351  NA 136 137 138 136  K 3.8 3.7 3.2* 3.5  CL 102 106 109 106  CO2 24 21* 24 22  GLUCOSE 339* 195* 115* 167*  BUN 15 16 13 14   CREATININE 0.79 0.69 0.67  0.64  CALCIUM 9.0 8.5* 8.3* 8.6*  MG 1.8  --   --   --     Liver Function Tests: Recent Labs  Lab 03/19/19 1108  AST 22  ALT 21  ALKPHOS 103  BILITOT 1.0  PROT 7.5  ALBUMIN 4.0   Recent Labs  Lab 03/19/19 1108  LIPASE 40   No results for input(s): AMMONIA in the last 168 hours.  CBC: Recent Labs  Lab 03/19/19 1108 03/20/19 0535 03/21/19 0459 03/22/19 0351  WBC 11.8* 13.9* 10.0 10.6*  NEUTROABS 9.1*  --   --   --   HGB 14.6 13.4 13.4 13.2  HCT 42.6 38.1* 39.8 38.3*  MCV 96.4 95.7 96.4 95.8  PLT 214 224 222 218    Cardiac Enzymes: No results for input(s): CKTOTAL, CKMB, CKMBINDEX, TROPONINI in the last 168 hours.  Lipid Panel: No results for input(s): CHOL, TRIG, HDL, CHOLHDL, VLDL, LDLCALC in the last 168 hours.  CBG: Recent Labs  Lab 03/21/19 1137 03/21/19 1714 03/21/19 2105 03/22/19 0234 03/22/19 0809  GLUCAP 143* 161* 197* 170* 150*    Microbiology: Results for orders placed or performed during the hospital encounter of 03/19/19  SARS Coronavirus 2 (CEPHEID - Performed in [facility] hospital lab), Hosp Order     Status: None   Collection Time: 03/19/19 11:09 AM   Specimen: Nasopharyngeal  Swab  Result Value Ref Range Status   SARS Coronavirus 2 NEGATIVE NEGATIVE Final    Comment: (NOTE) If result is NEGATIVE SARS-CoV-2 target nucleic acids are NOT DETECTED. The SARS-CoV-2 RNA is generally detectable in upper and lower  respiratory specimens during the acute phase of infection. The lowest  concentration of SARS-CoV-2 viral copies this assay can detect is 250  copies / mL. A negative result does not preclude SARS-CoV-2 infection  and should not be used as the sole basis for treatment or other  patient management decisions.  A negative result may occur with  improper specimen collection / handling, submission of specimen other  than nasopharyngeal swab, presence of viral mutation(s) within the  areas targeted by this assay, and inadequate number  of viral copies  (<250 copies / mL). A negative result must be combined with clinical  observations, patient history, and epidemiological information. If result is POSITIVE SARS-CoV-2 target nucleic acids are DETECTED. The SARS-CoV-2 RNA is generally detectable in upper and lower  respiratory specimens dur ing the acute phase of infection.  Positive  results are indicative of active infection with SARS-CoV-2.  Clinical  correlation with patient history and other diagnostic information is  necessary to determine patient infection status.  Positive results do  not rule out bacterial infection or co-infection with other viruses. If result is PRESUMPTIVE POSTIVE SARS-CoV-2 nucleic acids MAY BE PRESENT.   A presumptive positive result was obtained on the submitted specimen  and confirmed on repeat testing.  While 2019 novel coronavirus  (SARS-CoV-2) nucleic acids may be present in the submitted sample  additional confirmatory testing may be necessary for epidemiological  and / or clinical management purposes  to differentiate between  SARS-CoV-2 and other Sarbecovirus currently known to infect humans.  If clinically indicated additional testing with an alternate test  methodology [phone]) is advised. The SARS-CoV-2 RNA is generally  detectable in upper and lower respiratory sp ecimens during the acute  phase of infection. The expected result is Negative. Fact Sheet for Patients:  StrictlyIdeas.no Fact Sheet for Healthcare Providers: BankingDealers.co.za This test is not yet approved or cleared by the [place] FDA and has been authorized for detection and/or diagnosis of SARS-CoV-2 by FDA under an Emergency Use Authorization (EUA).  This EUA will remain in effect (meaning this test can be used) for the duration of the COVID-19 declaration under Section 564(b)(1) of the Act, 21 U.S.C. section 360bbb-3(b)(1), unless the authorization is  terminated or revoked sooner. Performed at [facility], [address] [place], [place] [address]     Coagulation Studies: Recent Labs    03/19/19 1108  LABPROT 12.5  INR 0.9    Imaging: Ct Head Wo Contrast  Result Date: 03/22/2019 CLINICAL DATA:  Follow-up stroke EXAM: CT HEAD WITHOUT CONTRAST TECHNIQUE: Contiguous axial images were obtained from the base of the skull through the vertex without intravenous contrast. COMPARISON:  MR brain, 03/21/2019, CT brain, 03/20/2019 FINDINGS: Brain: Redemonstrated hypodensity of the midline cerebellum and cerebellar vermis, with new, heterogeneous internal hyperdensity consistent with evolution of petechial hemorrhage appreciated on prior MRI (series 2, image 7). The fourth ventricle is partially effaced, as seen on prior MRI and CT, without evidence of developing hydrocephalus or significant change in caliber of the lateral and third ventricles. Underlying small-vessel white matter disease. Vascular: No hyperdense vessel or unexpected calcification. Skull: Normal. Negative for fracture or focal lesion. Sinuses/Orbits: No acute finding. Other: None. IMPRESSION: 1. Redemonstrated hypodensity of the midline cerebellum and  cerebellar vermis, with new, heterogeneous internal hyperdensity consistent with evolution of petechial hemorrhage appreciated on prior MRI (series 2, image 7). 2. The fourth ventricle is partially effaced, as seen on prior MRI and CT, without evidence of developing hydrocephalus or significant change in caliber of the lateral and third ventricles. 3.  Underlying small-vessel white matter disease. Electronically Signed   By: Eddie Candle M.D.   On: 03/22/2019 09:09   Ct Head Wo Contrast  Result Date: 03/20/2019 CLINICAL DATA:  Dizziness, nausea, vomiting. EXAM: CT HEAD WITHOUT CONTRAST TECHNIQUE: Contiguous axial images were obtained from the base of the skull through the vertex without intravenous contrast. COMPARISON:   12/20/2016 FINDINGS: Brain: There is low-density/edema noted in the midline of the cerebellum. 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 However, given the midline nature, recommend MRI for further evaluation. No hydrocephalus. Mild cerebral atrophy and chronic small vessel disease throughout the deep white matter. No hemorrhage. Vascular: No hyperdense vessel or unexpected calcification. Skull: No acute calvarial abnormality. Sinuses/Orbits: Visualized paranasal sinuses and mastoids clear. Orbital soft tissues unremarkable. 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 Given the unusual vascular pattern, recommend MRI with and without contrast for further evaluation. Electronically Signed   By: Rolm Baptise M.D.   On: 03/20/2019 19:24   Mr Angio Head Wo Contrast  Result Date: 03/21/2019 CLINICAL DATA:  78 y/o  M; nausea and vomiting. EXAM: MRI HEAD WITHOUT CONTRAST MRA HEAD WITHOUT CONTRAST TECHNIQUE: Multiplanar, multiecho pulse sequences of the brain and surrounding structures were obtained without intravenous contrast. Angiographic images of the head were obtained using MRA technique without contrast. COMPARISON:  03/20/2019 CT head.  03/21/2019 carotid ultrasound. FINDINGS: MRI HEAD FINDINGS Brain: Reduced diffusion of cerebellar vermis and the medial inferior cerebellar hemispheres of bilaterally compatible with acute/early subacute infarction. There is mild local mass effect with partial effacement of the fourth ventricle and increased T2 FLAIR signal. There is confluent petechial hemorrhage throughout the infarct on susceptibility weighted sequences. There is faint linear enhancement within the area of infarction likely represent small vessel structures. No masslike enhancement. No additional findings for stroke, hemorrhage, extra-axial collection, hydrocephalus, or herniation. Punctate nonspecific T2  FLAIR hyperintensities in subcortical and periventricular white matter are compatible with mild chronic microvascular ischemic changes. Mild volume loss of the brain. Vascular: There is increased susceptibility hypointensity throughout the area Skull and upper cervical spine: Normal marrow signal. Sinuses/Orbits: Mild mucosal thickening of the ethmoid air cells. Additional included paranasal sinuses and mastoid air cells demonstrate normal signal. Bilateral intra-ocular lens replacement. Other: None. MRA HEAD FINDINGS Internal carotid arteries:  Patent. Anterior cerebral arteries:  Patent. Middle cerebral arteries: Patent. Anterior communicating artery: Patent. Posterior communicating arteries:  Patent. Posterior cerebral arteries:  Patent. Basilar artery:  Patent. Vertebral arteries: Loss of flow related signal within the right vertebral artery between the PICA origin and the basilar, however normally enhancing on the contrast enhanced MRI of the brain, uncertain significance. Patent right PICA. No left PICA identified within the field of view. IMPRESSION: MRI head: 1. 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 Positive for hemorrhage, [place] calssification 1b: HI2, confluent petechiae, no mass effect. Mild local mass effect with partial effacement of fourth ventricle. No hydrocephalus at this time. 2. Mild chronic microvascular ischemic changes and volume loss of the brain. MRA head: 1. Loss of flow related signal within the right vertebral artery  between the PICA origin and the basilar, however, the vessel is normally enhancing on contrast enhanced MRI of the brain. The finding is of uncertain significance, possibly artifact. 2. Patent right PICA. No left PICA identified within the field of view. The stroke may be related to a left dominant PICA occlusion. CT angiography of the head and neck may better assess vascular anatomy. 3.  Otherwise unremarkable MRA of the head. These results will be called to the ordering clinician or representative by the Radiologist Assistant, and communication documented in the PACS or zVision Dashboard. Electronically Signed   By: Kristine Garbe M.D.   On: 03/21/2019 23:46   Mr Jeri Cos YT Contrast  Result Date: 03/21/2019 CLINICAL DATA:  78 y/o  M; nausea and vomiting. EXAM: MRI HEAD WITHOUT CONTRAST MRA HEAD WITHOUT CONTRAST TECHNIQUE: Multiplanar, multiecho pulse sequences of the brain and surrounding structures were obtained without intravenous contrast. Angiographic images of the head were obtained using MRA technique without contrast. COMPARISON:  03/20/2019 CT head.  03/21/2019 carotid ultrasound. FINDINGS: MRI HEAD FINDINGS Brain: Reduced diffusion of cerebellar vermis and the medial inferior cerebellar hemispheres of bilaterally compatible with acute/early subacute infarction. There is mild local mass effect with partial effacement of the fourth ventricle and increased T2 FLAIR signal. There is confluent petechial hemorrhage throughout the infarct on susceptibility weighted sequences. There is faint linear enhancement within the area of infarction likely represent small vessel structures. No masslike enhancement. No additional findings for stroke, hemorrhage, extra-axial collection, hydrocephalus, or herniation. Punctate nonspecific T2 FLAIR hyperintensities in subcortical and periventricular white matter are compatible with mild chronic microvascular ischemic changes. Mild volume loss of the brain. Vascular: There is increased susceptibility hypointensity throughout the area Skull and upper cervical spine: Normal marrow signal. Sinuses/Orbits: Mild mucosal thickening of the ethmoid air cells. Additional included paranasal sinuses and mastoid air cells demonstrate normal signal. Bilateral intra-ocular lens replacement. Other: None. MRA HEAD FINDINGS Internal carotid arteries:  Patent.  Anterior cerebral arteries:  Patent. Middle cerebral arteries: Patent. Anterior communicating artery: Patent. Posterior communicating arteries:  Patent. Posterior cerebral arteries:  Patent. Basilar artery:  Patent. Vertebral arteries: Loss of flow related signal within the right vertebral artery between the PICA origin and the basilar, however normally enhancing on the contrast enhanced MRI of the brain, uncertain significance. Patent right PICA. No left PICA identified within the field of view. IMPRESSION: MRI head: 1. 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 Positive for hemorrhage, [place] calssification 1b: HI2, confluent petechiae, no mass effect. Mild local mass effect with partial effacement of fourth ventricle. No hydrocephalus at this time. 2. Mild chronic microvascular ischemic changes and volume loss of the brain. MRA head: 1. Loss of flow related signal within the right vertebral artery between the PICA origin and the basilar, however, the vessel is normally enhancing on contrast enhanced MRI of the brain. The finding is of uncertain significance, possibly artifact. 2. Patent right PICA. No left PICA identified within the field of view. The stroke may be related to a left dominant PICA occlusion. CT angiography of the head and neck may better assess vascular anatomy. 3. Otherwise unremarkable MRA of the head. These results will be called to the ordering clinician or representative by the Radiologist Assistant, and communication documented in the PACS or zVision Dashboard. Electronically Signed   By: Kristine Garbe M.D.   On: 03/21/2019 23:46   US Carotid Bilateral  Result Date: 03/21/2019 CLINICAL DATA:  Dizziness, hypertension,  hyperlipidemia, diabetes, previous tobacco abuse EXAM: BILATERAL CAROTID DUPLEX ULTRASOUND TECHNIQUE: Pearline Cables scale imaging, color Doppler and duplex ultrasound were performed of  bilateral carotid and vertebral arteries in the neck. COMPARISON:  None. FINDINGS: Criteria: Quantification of carotid stenosis is based on velocity parameters that correlate the residual internal carotid diameter with NASCET-based stenosis levels, using the diameter of the distal internal carotid lumen as the denominator for stenosis measurement. The following velocity measurements were obtained: RIGHT ICA: 131/17 cm/sec CCA: 786/76 cm/sec SYSTOLIC ICA/CCA RATIO:  1.2 ECA: 136 cm/sec LEFT ICA: 82/12 cm/sec CCA: 72/09 cm/sec SYSTOLIC ICA/CCA RATIO:  0.9 ECA: 167 cm/sec RIGHT CAROTID ARTERY: Eccentric calcified plaque in the bulb and ICA origin resulting in at least mild stenosis. Normal waveforms and color Doppler signal. RIGHT VERTEBRAL ARTERY:  Normal flow direction and waveform. LEFT CAROTID ARTERY: Mild eccentric partially calcified plaque at the bifurcation and in the proximal ICA. No high-grade stenosis. Normal waveforms and color Doppler signal. LEFT VERTEBRAL ARTERY:  Normal flow direction and waveform. IMPRESSION: 1. 1. 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 2. Antegrade bilateral vertebral arterial flow. Electronically Signed   By: Lucrezia Europe M.D.   On: 03/21/2019 16:18    Medications:  I have reviewed the patient's current medications. Scheduled: . atorvastatin  40 mg Oral QHS  . [START ON 03/23/2019] Chlorhexidine Gluconate Cloth  6 each Topical Q0600  . docusate sodium  100 mg Oral Daily  . gabapentin  300 mg Oral QHS  . guaiFENesin  600 mg Oral BID  . insulin aspart  0-5 Units Subcutaneous QHS  . insulin aspart  0-9 Units Subcutaneous TID WC  . insulin glargine  20 Units Subcutaneous BID  . latanoprost  1 drop Both Eyes QHS  . lisinopril  40 mg Oral Daily  . tamsulosin  0.4 mg Oral QHS  . tiotropium  18 mcg Inhalation Once per day on Mon Wed Fri    Assessment/Plan: 78 year old male admitted with nausea and vomiting.  Stable overnight.   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  Likely related to small vessel disease.  Mild local mass effect with partial effacement of the fourth ventricle but no hydrocephalus noted.  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  Follow up head CT performed this morning shows no progression of infarct with hemorrhage now appreciated. Patient transferred to ICU overnight.   Echocardiogram shows EF of 60-65%, moderate LVH and mildly dilated atria.   Patient with vascular risk factors of HTN, HLD and DM.   A1c 9.0  Recommendations: 1. Would continue to hold anticoagulants and antiplatelet therapy 2. Blood sugar management with target A1c<7.0 3. Fasting lipid panel 4. Head CT to be repeated in AM 5. PT/OT, speech therapy evaluation 6. Continued frequent neuro checks and telemetry monitoring 6. Patient now stable.  If patient decompensates would have low threshold for repeat imaging to rule out hydrocephalus and further edema at which time transfer would be necessary.  Neurosurgery following patient.   LOS: 2 days   Alexis Goodell, MD Neurology [phone] 03/22/2019  10:25 AM

## 2019-03-22 NOTE — Progress Notes (Signed)
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  eICU Interventions  New patient evaluation completed. I agree with serial close neurologic monitoring.        [name] [name] 03/22/2019, 2:49 AM

## 2019-03-22 NOTE — Consult Note (Signed)
D/w Gardiner Barefoot, provider caring for patient overnight.  Just received MRI/A results.  78 yo male who presented with likely cerebellar stroke.  Neurology consulted.  MRI performed.  Shows bilateral inferior cerebellar infarcts with some 4th ventricular mass effect but no hydrocephalus.  Clinically unchanged.  Recommended transfer to ICU for closer observation.  If begins to develop hydrocephalus, may need transfer to tertiary care institution.  Does not require at this point.  Would stop any anticoagulants.  Full note to follow.  Meade Maw

## 2019-03-22 NOTE — Progress Notes (Addendum)
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  This morning he had some headache which is completely resolved.  Denies any peripheral weakness or dysarthria no other symptoms resting comfortably  REVIEW OF SYSTEMS:  CONSTITUTIONAL: No fever, fatigue or weakness.  EYES: No blurred or double vision.  EARS, NOSE, AND THROAT: No tinnitus or ear pain.  RESPIRATORY: No cough, shortness of breath, wheezing or hemoptysis.  CARDIOVASCULAR: No chest pain, orthopnea, edema.  GASTROINTESTINAL: No nausea, vomiting, diarrhea or abdominal pain.  GENITOURINARY: No dysuria, hematuria.  ENDOCRINE: No polyuria, nocturia,  HEMATOLOGY: No anemia, easy bruising or bleeding SKIN: No rash or lesion. MUSCULOSKELETAL: No joint pain or arthritis.   NEUROLOGIC: No tingling, numbness, weakness.  PSYCHIATRY: No anxiety or depression.   DRUG ALLERGIES:  No Known Allergies  VITALS:  Blood pressure (!) 154/69, pulse 72, temperature 98.6 F (37 C), temperature source Oral, resp. rate 13, height 6\' 1"  (1.854 m), weight 100.1 kg, SpO2 97 %.  PHYSICAL EXAMINATION:  GENERAL:  78 y.o.-year-old patient lying in the bed with no acute distress.  EYES: Pupils equal, round, reactive to light and accommodation. No scleral icterus. Extraocular muscles intact.  HEENT: Head atraumatic, normocephalic. Oropharynx and nasopharynx clear.  NECK:  Supple, no jugular venous distention. No thyroid enlargement, no tenderness.  LUNGS: Normal breath sounds bilaterally, no wheezing, rales,rhonchi or crepitation. No use of accessory muscles of respiration.  CARDIOVASCULAR: S1, S2 normal. No murmurs, rubs, or gallops.  ABDOMEN: Soft, nontender, nondistended. Bowel sounds present.  EXTREMITIES: No pedal edema, cyanosis, or clubbing.  NEUROLOGIC:  Cranial nerves II through XII are intact. Muscle strength 5/5 in all extremities. Sensation intact. Gait not checked.  PSYCHIATRIC: The patient is alert and oriented x 3.  SKIN: No obvious rash, lesion, or ulcer.    LABORATORY PANEL:   CBC Recent Labs  Lab 03/22/19 0351  WBC 10.6*  HGB 13.2  HCT 38.3*  PLT 218   ------------------------------------------------------------------------------------------------------------------  Chemistries  Recent Labs  Lab 03/19/19 1108  03/22/19 0351  NA 136   < > 136  K 3.8   < > 3.5  CL 102   < > 106  CO2 24   < > 22  GLUCOSE 339*   < > 167*  BUN 15   < > 14  CREATININE 0.79   < > 0.64  CALCIUM 9.0   < > 8.6*  MG 1.8  --   --   AST 22  --   --   ALT 21  --   --   ALKPHOS 103  --   --   BILITOT 1.0  --   --    < > = values in this interval not displayed.   ------------------------------------------------------------------------------------------------------------------  Cardiac Enzymes No results for input(s): TROPONINI in the last 168 hours. ------------------------------------------------------------------------------------------------------------------  RADIOLOGY:  Ct Head Wo Contrast  Result Date: 03/22/2019 CLINICAL DATA:  Follow-up stroke EXAM: CT HEAD WITHOUT CONTRAST TECHNIQUE: Contiguous axial images were obtained from the base of the skull through the vertex without intravenous contrast. COMPARISON:  MR brain, 03/21/2019, CT brain, 03/20/2019 FINDINGS: Brain: Redemonstrated hypodensity of the midline cerebellum and cerebellar vermis, with new, heterogeneous internal hyperdensity consistent with evolution of petechial hemorrhage appreciated on prior MRI (series 2, image 7). The fourth ventricle is partially effaced, as  seen on prior MRI and CT, without evidence of developing hydrocephalus or significant change in caliber of the lateral and third ventricles. Underlying small-vessel white matter disease. Vascular: No hyperdense  vessel or unexpected calcification. Skull: Normal. Negative for fracture or focal lesion. Sinuses/Orbits: No acute finding. Other: None. IMPRESSION: 1. Redemonstrated hypodensity of the midline cerebellum and cerebellar vermis, with new, heterogeneous internal hyperdensity consistent with evolution of petechial hemorrhage appreciated on prior MRI (series 2, image 7). 2. The fourth ventricle is partially effaced, as seen on prior MRI and CT, without evidence of developing hydrocephalus or significant change in caliber of the lateral and third ventricles. 3.  Underlying small-vessel white matter disease. Electronically Signed   By: Eddie Candle M.D.   On: 03/22/2019 09:09   Ct Head Wo Contrast  Result Date: 03/20/2019 CLINICAL DATA:  Dizziness, nausea, vomiting. EXAM: CT HEAD WITHOUT CONTRAST TECHNIQUE: Contiguous axial images were obtained from the base of the skull through the vertex without intravenous contrast. COMPARISON:  12/20/2016 FINDINGS: Brain: There is low-density/edema noted in the midline of the cerebellum. 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 However, given the midline nature, recommend MRI for further evaluation. No hydrocephalus. Mild cerebral atrophy and chronic small vessel disease throughout the deep white matter. No hemorrhage. Vascular: No hyperdense vessel or unexpected calcification. Skull: No acute calvarial abnormality. Sinuses/Orbits: Visualized paranasal sinuses and mastoids clear. Orbital soft tissues unremarkable. 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 Given the unusual vascular pattern, recommend MRI with and without contrast for further evaluation. Electronically Signed   By: Rolm Baptise M.D.   On: 03/20/2019 19:24   Mr Angio Head Wo Contrast  Result Date: 03/21/2019 CLINICAL DATA:  78 y/o  M; nausea and vomiting. EXAM: MRI HEAD WITHOUT CONTRAST MRA HEAD WITHOUT  CONTRAST TECHNIQUE: Multiplanar, multiecho pulse sequences of the brain and surrounding structures were obtained without intravenous contrast. Angiographic images of the head were obtained using MRA technique without contrast. COMPARISON:  03/20/2019 CT head.  03/21/2019 carotid ultrasound. FINDINGS: MRI HEAD FINDINGS Brain: Reduced diffusion of cerebellar vermis and the medial inferior cerebellar hemispheres of bilaterally compatible with acute/early subacute infarction. There is mild local mass effect with partial effacement of the fourth ventricle and increased T2 FLAIR signal. There is confluent petechial hemorrhage throughout the infarct on susceptibility weighted sequences. There is faint linear enhancement within the area of infarction likely represent small vessel structures. No masslike enhancement. No additional findings for stroke, hemorrhage, extra-axial collection, hydrocephalus, or herniation. Punctate nonspecific T2 FLAIR hyperintensities in subcortical and periventricular white matter are compatible with mild chronic microvascular ischemic changes. Mild volume loss of the brain. Vascular: There is increased susceptibility hypointensity throughout the area Skull and upper cervical spine: Normal marrow signal. Sinuses/Orbits: Mild mucosal thickening of the ethmoid air cells. Additional included paranasal sinuses and mastoid air cells demonstrate normal signal. Bilateral intra-ocular lens replacement. Other: None. MRA HEAD FINDINGS Internal carotid arteries:  Patent. Anterior cerebral arteries:  Patent. Middle cerebral arteries: Patent. Anterior communicating artery: Patent. Posterior communicating arteries:  Patent. Posterior cerebral arteries:  Patent. Basilar artery:  Patent. Vertebral arteries: Loss of flow related signal within the right vertebral artery between the PICA origin and the basilar, however normally enhancing on the contrast enhanced MRI of the brain, uncertain significance. Patent  right PICA. No left PICA identified within the field of view. IMPRESSION: MRI head: 1. Acute/early subacute infarction involving the inferior cerebellum and  medial aspect of bilateral cerebellar hemispheres. Positive for hemorrhage, Heidelberg calssification 1b: HI2, confluent petechiae, no mass effect. Mild local mass effect with partial effacement of fourth ventricle. No hydrocephalus at this time. 2. Mild chronic microvascular ischemic changes and volume loss of the brain. MRA head: 1. Loss of flow related signal within the right vertebral artery between the PICA origin and the basilar, however, the vessel is normally enhancing on contrast enhanced MRI of the brain. The finding is of uncertain significance, possibly artifact. 2. Patent right PICA. No left PICA identified within the field of view. The stroke may be related to a left dominant PICA occlusion. CT angiography of the head and neck may better assess vascular anatomy. 3. Otherwise unremarkable MRA of the head. These results will be called to the ordering clinician or representative by the Radiologist Assistant, and communication documented in the PACS or zVision Dashboard. Electronically Signed   By: Kristine Garbe M.D.   On: 03/21/2019 23:46   Mr Jeri Cos ZO Contrast  Result Date: 03/21/2019 CLINICAL DATA:  78 y/o  M; nausea and vomiting. EXAM: MRI HEAD WITHOUT CONTRAST MRA HEAD WITHOUT CONTRAST TECHNIQUE: Multiplanar, multiecho pulse sequences of the brain and surrounding structures were obtained without intravenous contrast. Angiographic images of the head were obtained using MRA technique without contrast. COMPARISON:  03/20/2019 CT head.  03/21/2019 carotid ultrasound. FINDINGS: MRI HEAD FINDINGS Brain: Reduced diffusion of cerebellar vermis and the medial inferior cerebellar hemispheres of bilaterally compatible with acute/early subacute infarction. There is mild local mass effect with partial effacement of the fourth ventricle and  increased T2 FLAIR signal. There is confluent petechial hemorrhage throughout the infarct on susceptibility weighted sequences. There is faint linear enhancement within the area of infarction likely represent small vessel structures. No masslike enhancement. No additional findings for stroke, hemorrhage, extra-axial collection, hydrocephalus, or herniation. Punctate nonspecific T2 FLAIR hyperintensities in subcortical and periventricular white matter are compatible with mild chronic microvascular ischemic changes. Mild volume loss of the brain. Vascular: There is increased susceptibility hypointensity throughout the area Skull and upper cervical spine: Normal marrow signal. Sinuses/Orbits: Mild mucosal thickening of the ethmoid air cells. Additional included paranasal sinuses and mastoid air cells demonstrate normal signal. Bilateral intra-ocular lens replacement. Other: None. MRA HEAD FINDINGS Internal carotid arteries:  Patent. Anterior cerebral arteries:  Patent. Middle cerebral arteries: Patent. Anterior communicating artery: Patent. Posterior communicating arteries:  Patent. Posterior cerebral arteries:  Patent. Basilar artery:  Patent. Vertebral arteries: Loss of flow related signal within the right vertebral artery between the PICA origin and the basilar, however normally enhancing on the contrast enhanced MRI of the brain, uncertain significance. Patent right PICA. No left PICA identified within the field of view. IMPRESSION: MRI head: 1. 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 Positive for hemorrhage, Heidelberg calssification 1b: HI2, confluent petechiae, no mass effect. Mild local mass effect with partial effacement of fourth ventricle. No hydrocephalus at this time. 2. Mild chronic microvascular ischemic changes and volume loss of the brain. MRA head: 1. Loss of flow related signal within the right vertebral artery between  the PICA origin and the basilar, however, the vessel is normally enhancing on contrast enhanced MRI of the brain. The finding is of uncertain significance, possibly artifact. 2. Patent right PICA. No left PICA identified within the field of view. The stroke may be related to a left dominant PICA occlusion. CT angiography of the head and neck may better assess vascular anatomy. 3.  Otherwise unremarkable MRA of the head. These results will be called to the ordering clinician or representative by the Radiologist Assistant, and communication documented in the PACS or zVision Dashboard. Electronically Signed   By: Kristine Garbe M.D.   On: 03/21/2019 23:46   US Carotid Bilateral  Result Date: 03/21/2019 CLINICAL DATA:  Dizziness, hypertension, hyperlipidemia, diabetes, previous tobacco abuse EXAM: BILATERAL CAROTID DUPLEX ULTRASOUND TECHNIQUE: Pearline Cables scale imaging, color Doppler and duplex ultrasound were performed of bilateral carotid and vertebral arteries in the neck. COMPARISON:  None. FINDINGS: Criteria: Quantification of carotid stenosis is based on velocity parameters that correlate the residual internal carotid diameter with NASCET-based stenosis levels, using the diameter of the distal internal carotid lumen as the denominator for stenosis measurement. The following velocity measurements were obtained: RIGHT ICA: 131/17 cm/sec CCA: 737/10 cm/sec SYSTOLIC ICA/CCA RATIO:  1.2 ECA: 136 cm/sec LEFT ICA: 82/12 cm/sec CCA: 62/69 cm/sec SYSTOLIC ICA/CCA RATIO:  0.9 ECA: 167 cm/sec RIGHT CAROTID ARTERY: Eccentric calcified plaque in the bulb and ICA origin resulting in at least mild stenosis. Normal waveforms and color Doppler signal. RIGHT VERTEBRAL ARTERY:  Normal flow direction and waveform. LEFT CAROTID ARTERY: Mild eccentric partially calcified plaque at the bifurcation and in the proximal ICA. No high-grade stenosis. Normal waveforms and color Doppler signal. LEFT VERTEBRAL ARTERY:  Normal flow  direction and waveform. IMPRESSION: 1. 1. 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 2. Antegrade bilateral vertebral arterial flow. Electronically Signed   By: Lucrezia Europe M.D.   On: 03/21/2019 16:18    EKG:   Orders placed or performed during the hospital encounter of 12/20/16  . ED EKG  . ED EKG  . EKG 12-Lead  . EKG 12-Lead  . ED EKG  . ED EKG  . EKG 12-Lead  . EKG 12-Lead  . EKG  . 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 Doy Mince and neurosurgery Dr. Cari Caraway are following If patient develops worsening headache or worsening of imaging findings are altered mental status  need to be transferred to tertiary care center Repeat CT head in a.m. Neurology is recommending to continue to hold anticoagulation and antiplatelet therapy Target A1c goal less than 7.0 Check lipid panel PT/OT/speech therapy assessment   #.   Dizziness probably Symptomatic bradycardia patient does not appear to be on any heart  rate lowering medications Asymptomatic Cardiology is following, heart rate is stable at around 50s We will monitor on telemetry Echocardiogram of the heart-60 to 65% ejection fraction, normal systolic function  -stress test in May 2018 has revealed normal LV function with EF estimated at 57% no evidence of stress-induced ischemia -Carotid Dopplers with no significant stenosis  2.  Diabetes type 2 we will continue Lantus hold metformin due to nausea, I will also hold Glucotrol XL Dietitian-provided diet education   3.  Hyperlipidemia continue Lipitor  4.  Glaucoma continue eyedrops  5.  Hypertension continue lisinopril monitor blood pressure  6.  Acute gastritis we will check GI  panel PPI.  Check for H. pylori  PT consult  All the records are reviewed and case discussed with Care Management/Social Workerr. Management plans discussed with the patient, try to reach patient's wife at [phone] could not reach her and voicemail box set up  CODE  STATUS: fc  TOTAL TIME TAKING CARE OF THIS PATIENT: 36  minutes.   POSSIBLE D/C IN  DAYS, DEPENDING ON CLINICAL CONDITION.  Note: This dictation was prepared with Dragon dictation along with smaller phrase technology. Any transcriptional errors that result from this process are unintentional.   Nicholes Mango M.D on 03/22/2019 at 3:17 PM  Between 7am to 6pm - Pager - [phone] After 6pm go to [email or web address] - password [email or web address] [facility]  Office  [phone]  CC: Primary care physician; Patient, No Pcp Per

## 2019-03-22 NOTE — Progress Notes (Signed)
Patients wife states he had a fall about a month ago, injured his rib and nose. Patient wife states he hit is head.

## 2019-03-22 NOTE — Consult Note (Addendum)
Referring Physician:  No referring provider defined for this encounter.  Primary Physician:  Patient, No Pcp Per  Chief Complaint:  Posterior fossa stroke  History of Present Illness: 03/22/2019 Mathew Baker is a 78 y.o. male who presents with the chief complaint of nausea and vomiting, which started over the last 2 weeks.  He came to the hospital when it became problematic for him to walk, and when his vomiting became intractable.  That occurred approximately 3 days ago.    He reports mild headache now, but no vomiting or nausea currently.  No major change in symptoms overnight.  He continues to have imbalance.  Review of Systems:  A 10 point review of systems is negative, except for the pertinent positives and negatives detailed in the HPI.  Past Medical History: Past Medical History:  Diagnosis Date  . Chronic airway obstruction ([place])   . Degeneration of intervertebral disc of lumbar region   . Diabetes mellitus without complication ([place])   . Essential hypertension, benign   . Hyperlipidemia   . Hypertension   . Osteoarthritis of lower extremity   . Pneumonia, organism unspecified(486)   . PSA elevation 07/16/2013   Normal    Past Surgical History: Past Surgical History:  Procedure Laterality Date  . CATARACT EXTRACTION W/ INTRAOCULAR LENS  IMPLANT, BILATERAL    . COLONOSCOPY  2006   Normal: Repeat in 10 yrs    Allergies: Allergies as of 03/19/2019  . (No Known Allergies)    Medications:  Current Facility-Administered Medications:  .  0.9 %  sodium chloride infusion, , Intravenous, Continuous, Dustin Flock, MD, Last Rate: 75 mL/hr at 03/22/19 0252 .  acetaminophen (TYLENOL) tablet 650 mg, 650 mg, Oral, Q6H PRN, 650 mg at 03/21/19 1727 **OR** acetaminophen (TYLENOL) suppository 650 mg, 650 mg, Rectal, Q6H PRN, Dustin Flock, MD .  albuterol (PROVENTIL) (2.5 MG/3ML) 0.083% nebulizer solution 2.5 mg, 2.5 mg, Nebulization, Q4H PRN, Dustin Flock, MD .   atorvastatin (LIPITOR) tablet 40 mg, 40 mg, Oral, QHS, Dustin Flock, MD, 40 mg at 03/21/19 2112 .  benzonatate (TESSALON) capsule 100 mg, 100 mg, Oral, TID PRN, Dustin Flock, MD .  diclofenac sodium (VOLTAREN) 1 % transdermal gel 1 application, 1 application, Topical, Daily PRN, Dustin Flock, MD .  gabapentin (NEURONTIN) capsule 300 mg, 300 mg, Oral, QHS, Dustin Flock, MD, 300 mg at 03/21/19 2112 .  guaiFENesin (MUCINEX) 12 hr tablet 600 mg, 600 mg, Oral, BID, Dustin Flock, MD, 600 mg at 03/21/19 2112 .  hydrALAZINE (APRESOLINE) injection 10 mg, 10 mg, Intravenous, Q6H PRN, Mansy, Jan A, MD, 10 mg at 03/22/19 0125 .  HYDROcodone-acetaminophen (NORCO/VICODIN) 5-325 MG per tablet 1 tablet, 1 tablet, Oral, BID PRN, Gouru, Aruna, MD .  insulin aspart (novoLOG) injection 0-5 Units, 0-5 Units, Subcutaneous, QHS, Dustin Flock, MD, 2 Units at 03/19/19 2231 .  insulin aspart (novoLOG) injection 0-9 Units, 0-9 Units, Subcutaneous, TID WC, Dustin Flock, MD, 2 Units at 03/21/19 1724 .  insulin glargine (LANTUS) injection 20 Units, 20 Units, Subcutaneous, BID, Gouru, Aruna, MD, 20 Units at 03/21/19 2113 .  latanoprost (XALATAN) 0.005 % ophthalmic solution 1 drop, 1 drop, Both Eyes, QHS, Dustin Flock, MD, 1 drop at 03/21/19 2116 .  lisinopril (ZESTRIL) tablet 40 mg, 40 mg, Oral, Daily, Gouru, Aruna, MD .  ondansetron (ZOFRAN) tablet 4 mg, 4 mg, Oral, Q6H PRN **OR** ondansetron (ZOFRAN) injection 4 mg, 4 mg, Intravenous, Q6H PRN, Dustin Flock, MD, 4 mg at 03/22/19 0650 .  polyvinyl alcohol (  LIQUIFILM TEARS) 1.4 % ophthalmic solution 2 drop, 2 drop, Both Eyes, 5 X Daily PRN, Dustin Flock, MD .  tamsulosin (FLOMAX) capsule 0.4 mg, 0.4 mg, Oral, QHS, Dustin Flock, MD, 0.4 mg at 03/21/19 2112 .  tiotropium (SPIRIVA) inhalation capsule ([facility] use ONLY) 18 mcg, 18 mcg, Inhalation, Once per day on Mon Wed Fri, Patel, Chana Bode, MD, 18 mcg at 03/21/19 0848   Social History: Social History    Tobacco Use  . Smoking status: Former Smoker    Quit date: 09/06/1958    Years since quitting: 60.5  . Smokeless tobacco: Never Used  Substance Use Topics  . Alcohol use: No    Alcohol/week: 0.0 standard drinks  . Drug use: No    Family Medical History: Family History  Problem Relation Age of Onset  . Diabetes Mother     Physical Examination: Vitals:   03/22/19 0026 03/22/19 0230  BP: (!) 162/83   Pulse: (!) 53   Resp:    Temp:  98.3 F (36.8 C)  SpO2:       General: Patient is well developed, well nourished, calm, collected, and in no apparent distress.  Psychiatric: Patient is non-anxious.  Head:  Pupils equal, round, and reactive to light.  ENT:  Oral mucosa appears well hydrated.  Neck:   Supple.  Full range of motion.  Respiratory: Patient is breathing without any difficulty.  Extremities: No edema.  Vascular: Palpable pulses in dorsal pedal vessels.  Skin:   On exposed skin, there are no abnormal skin lesions.  NEUROLOGICAL:  General: In no acute distress.   Awake, alert, oriented to person, place, and time.  Pupils equal round and reactive to light.  Facial tone is symmetric.  Tongue protrusion is midline.  There is no pronator drift.   Strength: Side Biceps Triceps Deltoid Interossei Grip Wrist Ext. Wrist Flex.  R 5 5 5 5 5 5 5   L 5 5 5 5 5 5 5    Side Iliopsoas Quads Hamstring PF DF EHL  R 5 5 5 5 5 5   L 5 5 5 5 5 5    Reflexes are 1+ and symmetric at the biceps, triceps, brachioradialis, patella and achilles.   Bilateral upper and lower extremity sensation is intact to light touch.  Clonus is not present.  Toes are down-going.  Gait is untested.  Hoffman's is absent.  Dysmetria is present in [place].  Imaging: MRIA Brain 03/21/2019 IMPRESSION: MRI head:  1. 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 Positive for hemorrhage, [place]idelberg calssification 1b: HI2,  confluent petechiae, no mass effect. Mild local mass effect with partial effacement of fourth ventricle. No hydrocephalus at this time. 2. Mild chronic microvascular ischemic changes and volume loss of the brain.  MRA head:  1. Loss of flow related signal within the right vertebral artery between the PICA origin and the basilar, however, the vessel is normally enhancing on contrast enhanced MRI of the brain. The finding is of uncertain significance, possibly artifact. 2. Patent right PICA. No left PICA identified within the field of view. The stroke may be related to a left dominant PICA occlusion. CT angiography of the head and neck may better assess vascular anatomy. 3. Otherwise unremarkable MRA of the head.  These results will be called to the ordering clinician or representative by the Radiologist Assistant, and communication documented in the PACS or zVision Dashboard.   Electronically Signed   By: Edgardo Roys.D.  On: 03/21/2019 23:46  I have personally reviewed the images and agree with the above interpretation.  Assessment and Plan: Mr. Beazer is a pleasant 78 y.o. male with cerebellar stroke with mild to moderate swelling but without hydrocephalus.  His imaging findings are slightly worse today than yesterday.    I recommend continued observation in ICU with another repeat head CT tomorrow AM (ordered).  If he develops worsening headache, altered mental status, or worsening imaging findings, I would recommend transfer to a tertiary facility, as we do not have the capability of external ventricular drainage at this facility. Please continue to hold SQH and aspirin for now until we see stabilization of imaging findings.    Abdul Beirne K. Izora Ribas MD, [facility]

## 2019-03-22 NOTE — Progress Notes (Signed)
Notified PA Gardiner Barefoot about results of MRI. Performed NIH scale. No deficits noted. New orders has been placed. Patient will be transferred to ICU.  Blood pressure is elevated, notified Dr.Mansy. Received new orders.  Gardiner Barefoot will notify family about current condition and results.

## 2019-03-22 NOTE — Progress Notes (Signed)
   03/22/19 1400  Clinical Encounter Type  Visited With Patient  Visit Type Follow-up;Spiritual support  Spiritual Encounters  Spiritual Needs Grief support;Emotional  Stress Factors  Patient Stress Factors Health changes;Loss of control  Family Stress Factors None identified  Advance Directives (For Healthcare)  Does Patient Have a Medical Advance Directive? Yes  Does patient want to make changes to medical advance directive? No - Patient declined  Type of Advance Directive [place] in Chart? No - copy requested  Healthcare Power of Attorney Requested and Now in Chart Copy in chart   Ch f/u with pt upon noticing that pt has transferred to ICU. Pt was alert and oriented upon entry. Pt is lucid and able to hold a conversation. 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 Pt states that this is his first hospitalization and has been in fairly goo health most of his life. Ch asked guided questions as pt shared about his grandkids he hopes to see before they move to [place]. Ch provided a compassionate presence while pt reflected on his life specifically time in [place]. Pt was appreciative of the f/u visit.  F/U: communicate w/ care team regarding pt's progress/ determine if pt's family has been updated on pt's current status and what the pt's TOC would include.

## 2019-03-22 NOTE — Consult Note (Signed)
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   BRIEF PATIENT DESCRIPTION:  78 y.o. Male admitted to Med-Surg unit with symptomatic bradycardia.  Found to have bilateral inferior cerebellar infarcts (+ for hemorrhage) with ventricular mass effect but no Hydrocephalus, on MRI Head 7/15.  Pt is asymptomatic. Neurology and  Neurosurgery following, transfer to ICU for close monitoring.  SIGNIFICANT EVENTS  7/13>>Admission to Med-Surg Unit 7/15>> MRI with bilateral inferior cerebellar infarcts with ventricular mass effect 7/15>> Neurosurgery consulted, transfer to ICU  STUDIES:  CT Head 7/14>> Midline edema within the cerebellum, seemingly cytotoxic related toinfarct. Given the unusual vascular pattern, recommend MRI with and without contrast for further evaluation. Carotid Ultrasound 7/15>> 1. 1. 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 2. Antegrade bilateral vertebral arterial flow. MRI Head 7/15>>  1. 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 Positive for hemorrhage, Heidelberg calssification 1b: HI2, confluent petechiae, no mass effect. Mild local mass effect with partial effacement of fourth ventricle. No hydrocephalus at this time. 2. Mild chronic microvascular ischemic changes and volume loss of the brain. MRA Head 7/15>> 1. Loss of flow related signal within the right vertebral arterybetween the PICA origin and the basilar, however, the vessel is normally enhancing on contrast enhanced MRI of the brain. The finding is of uncertain significance, possibly artifact. 2. Patent right PICA. No left PICA identified within the field of view. The  stroke may be related to a left dominant PICA occlusion. CT angiography of the head and neck may better assess vascular anatomy. 3. Otherwise unremarkable MRA of the head.  CULTURES: SARS-CoV-2 PCR 7/13>> Negative  ANTIBIOTICS: N/A  HISTORY OF PRESENT ILLNESS:   Mathew Baker is a 78 y.o. Male with a PMH notable for Type 2 DM, COPD, HTN, and Hyperlipidemia who presented to [facility] ED on 03/19/19 due to a 3 week history of intermittent dizziness, near syncopal episodes, and a one day history of nausea/vomiting.  Upon presentation to the ED he was found to be bradycardic with HR in the 30's. His SARS-CoV-2 PCR was negative. He was admitted to [facility] unit for further workup and treatment of symptomatic Bradycardia and near syncope with Cardiology consultation.  CT Head on 7/15 showed midline cerebellar hypodensity consistent with edema, therefore Neurology was consulted.  MRI of the brain showed bilateral inferior cerebellar infarcts with some 4th ventricular mass effect but no hydrocephalus.  Therefore Neurosurgery was consulted who recommended transfer to ICU for closer monitoring.  Anticoagulants have been discontinued, with plan for repeat CT at 0600 on 7/16.  PCCM is consulted for further management while in ICU.  PAST MEDICAL HISTORY :   has a past medical history of Chronic airway obstruction ([place]), Degeneration of intervertebral disc of lumbar region, Diabetes mellitus without complication ([place]), Essential hypertension, benign, Hyperlipidemia, Hypertension, Osteoarthritis of lower extremity, Pneumonia, organism unspecified(486), and PSA elevation (07/16/2013).  has a past surgical history that includes Cataract extraction w/ intraocular lens  implant, bilateral and Colonoscopy (2006). Prior to Admission medications   Medication Sig Start Date End Date Taking? Authorizing Provider  albuterol (PROVENTIL HFA;VENTOLIN HFA) 108 (90 Base) MCG/ACT inhaler Inhale 2 puffs into the lungs 4 (four) times  daily as needed for wheezing or shortness of breath.  Yes [provider]  amLODipine (NORVASC) 10 MG tablet Take 10 mg by mouth daily.   Yes [provider]  atorvastatin (LIPITOR) 80 MG tablet Take 40 mg by mouth at bedtime.   Yes [provider]  Dulaglutide (TRULICITY) 1.5 DE/0.8XK SOPN Inject 1.5 mg into the skin every 7 (seven) days.   Yes [provider]  glucose blood (PRECISION XTRA TEST STRIPS) test strip Use to test blood sugar 2 times daily as instructed. Dx: E11.65 08/22/15  Yes Philemon Kingdom, MD  HYDROcodone-acetaminophen (NORCO/VICODIN) 5-325 MG tablet Take 1 tablet by mouth 2 (two) times a day.    Yes [provider]  insulin glargine (LANTUS) 100 UNIT/ML injection Inject 0.7 mLs (70 Units total) into the skin 2 (two) times daily. 08/22/15  Yes Philemon Kingdom, MD  Insulin Pen Needle (CAREFINE PEN NEEDLES) 32G X 4 MM MISC Use 1x a day 08/21/14  Yes Philemon Kingdom, MD  latanoprost (XALATAN) 0.005 % ophthalmic solution Place 1 drop into both eyes at bedtime.   Yes [provider]  lisinopril (ZESTRIL) 40 MG tablet Take 40 mg by mouth daily.   Yes [provider]  Magnesium Oxide 420 MG TABS Take 420 mg by mouth daily.    Yes [provider]  metFORMIN (GLUCOPHAGE) 1000 MG tablet Take 1 tablet (1,000 mg total) by mouth 2 (two) times daily with a meal. 03/25/15  Yes Philemon Kingdom, MD  sildenafil (VIAGRA) 100 MG tablet Take 100 mg by mouth daily as needed for erectile dysfunction.   Yes [provider]  tamsulosin (FLOMAX) 0.4 MG CAPS capsule Take 0.4 mg by mouth at bedtime.   Yes [provider]  tiotropium (SPIRIVA) 18 MCG inhalation capsule Place 18 mcg into inhaler and inhale daily.    Yes [provider]  glipiZIDE (GLUCOTROL XL) 5 MG 24 hr tablet Take 1 tablet (5 mg total) by mouth daily with breakfast. Patient not taking: Reported on 12/20/2016 08/22/15   Philemon Kingdom,  MD  guaiFENesin (MUCINEX) 600 MG 12 hr tablet Take 1 tablet (600 mg total) by mouth 2 (two) times daily. Patient not taking: Reported on 03/19/2019 12/21/16   Lavina Hamman, MD  lisinopril (PRINIVIL,ZESTRIL) 30 MG tablet Take 1 tablet (30 mg total) by mouth daily. Patient not taking: Reported on 03/19/2019 12/21/16   Lavina Hamman, MD   No Known Allergies  FAMILY HISTORY:  family history includes Diabetes in his mother. SOCIAL HISTORY:  reports that he quit smoking about 60 years ago. He has never used smokeless tobacco. He reports that he does not drink alcohol or use drugs.   COVID-19 DISASTER DECLARATION:  FULL CONTACT PHYSICAL EXAMINATION WAS NOT POSSIBLE DUE TO TREATMENT OF COVID-19 AND  CONSERVATION OF PERSONAL PROTECTIVE EQUIPMENT, LIMITED EXAM FINDINGS INCLUDE-  Patient assessed or the symptoms described in the history of present illness.  In the context of the Global COVID-19 pandemic, which necessitated consideration that the patient might be at risk for infection with the SARS-CoV-2 virus that causes COVID-19, Institutional protocols and algorithms that pertain to the evaluation of patients at risk for COVID-19 are in a state of rapid change based on information released by regulatory bodies including the CDC and federal and state organizations. These policies and algorithms were followed during the patient's care while in hospital.  REVIEW OF SYSTEMS:  Positives in BOLD Constitutional: Negative for fever, chills, weight loss, malaise/fatigue and diaphoresis.  HENT: Negative for hearing loss, ear pain, nosebleeds, congestion, sore throat,  neck pain, tinnitus and ear discharge.   Eyes: Negative for blurred vision, double vision, photophobia, pain, discharge and redness.  Respiratory: Negative for cough, hemoptysis, sputum production, shortness of breath, wheezing and stridor.   Cardiovascular: Negative for chest pain, palpitations, orthopnea, claudication, leg swelling and PND.   Gastrointestinal: Negative for heartburn, nausea, vomiting, abdominal pain, diarrhea, constipation, blood in stool and melena.  Genitourinary: Negative for dysuria, urgency, frequency, hematuria and flank pain.  Musculoskeletal: Negative for myalgias, back pain, joint pain and falls.  Skin: Negative for itching and rash.  Neurological: Negative for +dizziness, tingling, tremors, sensory change, speech change, focal weakness, seizures, loss of consciousness, weakness and headaches.  Endo/Heme/Allergies: Negative for environmental allergies and polydipsia. Does not bruise/bleed easily.  SUBJECTIVE:  Pt reports dizziness Denies chest pain, shortness of breath, N/V, abdominal pain, palpitations No focal deficits On room air  VITAL SIGNS: Temp:  [97.8 F (36.6 C)-98.7 F (37.1 C)] 97.8 F (36.6 C) (07/16 0024) Pulse Rate:  [51-64] 53 (07/16 0026) Resp:  [19] 19 (07/15 1950) BP: (148-172)/(64-84) 162/83 (07/16 0026) SpO2:  [95 %-99 %] 95 % (07/16 0024)  PHYSICAL EXAMINATION: General:  Acutely ill appearing male, sitting in bed, on room air, in no acute distress Neuro:  Sleeping, arouses to voice, A&O, follows commands, no focal deficits, speech clear, pupils PERRLA HEENT:  Atraumatic, normocephalic, neck supple, no JVD Cardiovascular:  Irregularly irregular rhythm, 2/6 systolic murmur, no rubs or gallops, 2+ pulses throughout Lungs:  Clear to auscultation bilaterally, even, nonlabored, normal effort Abdomen:  Soft, nontender, nondistended, no guarding or rebound tenderness, bowel sounds positive x4 Musculoskeletal: Normal bulk and tone, no deformities, no edema Skin: Warm and dry, no obvious rashes lesions or ulcerations  Recent Labs  Lab 03/19/19 1108 03/20/19 0535 03/21/19 0459  NA 136 137 138  K 3.8 3.7 3.2*  CL 102 106 109  CO2 24 21* 24  BUN 15 16 13   CREATININE 0.79 0.69 0.67  GLUCOSE 339* 195* 115*   Recent Labs  Lab 03/19/19 1108 03/20/19 0535 03/21/19 0459  HGB  14.6 13.4 13.4  HCT 42.6 38.1* 39.8  WBC 11.8* 13.9* 10.0  PLT 214 224 222   Ct Head Wo Contrast  Result Date: 03/20/2019 CLINICAL DATA:  Dizziness, nausea, vomiting. EXAM: CT HEAD WITHOUT CONTRAST TECHNIQUE: Contiguous axial images were obtained from the base of the skull through the vertex without intravenous contrast. COMPARISON:  12/20/2016 FINDINGS: Brain: There is low-density/edema noted in the midline of the cerebellum. 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 However, given the midline nature, recommend MRI for further evaluation. No hydrocephalus. Mild cerebral atrophy and chronic small vessel disease throughout the deep white matter. No hemorrhage. Vascular: No hyperdense vessel or unexpected calcification. Skull: No acute calvarial abnormality. Sinuses/Orbits: Visualized paranasal sinuses and mastoids clear. Orbital soft tissues unremarkable. 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 Given the unusual vascular pattern, recommend MRI with and without contrast for further evaluation. Electronically Signed   By: Rolm Baptise M.D.   On: 03/20/2019 19:24   Mr Angio Head Wo Contrast  Result Date: 03/21/2019 CLINICAL DATA:  78 y/o  M; nausea and vomiting. EXAM: MRI HEAD WITHOUT CONTRAST MRA HEAD WITHOUT CONTRAST TECHNIQUE: Multiplanar, multiecho pulse sequences of the brain and surrounding structures were obtained without intravenous contrast. Angiographic images of the head were obtained using MRA technique without contrast. COMPARISON:  03/20/2019 CT head.  03/21/2019 carotid ultrasound.  FINDINGS: MRI HEAD FINDINGS Brain: Reduced diffusion of cerebellar vermis and the medial inferior cerebellar hemispheres of bilaterally compatible with acute/early subacute infarction. There is mild local mass effect with partial effacement of the fourth ventricle and increased T2 FLAIR signal. There is  confluent petechial hemorrhage throughout the infarct on susceptibility weighted sequences. There is faint linear enhancement within the area of infarction likely represent small vessel structures. No masslike enhancement. No additional findings for stroke, hemorrhage, extra-axial collection, hydrocephalus, or herniation. Punctate nonspecific T2 FLAIR hyperintensities in subcortical and periventricular white matter are compatible with mild chronic microvascular ischemic changes. Mild volume loss of the brain. Vascular: There is increased susceptibility hypointensity throughout the area Skull and upper cervical spine: Normal marrow signal. Sinuses/Orbits: Mild mucosal thickening of the ethmoid air cells. Additional included paranasal sinuses and mastoid air cells demonstrate normal signal. Bilateral intra-ocular lens replacement. Other: None. MRA HEAD FINDINGS Internal carotid arteries:  Patent. Anterior cerebral arteries:  Patent. Middle cerebral arteries: Patent. Anterior communicating artery: Patent. Posterior communicating arteries:  Patent. Posterior cerebral arteries:  Patent. Basilar artery:  Patent. Vertebral arteries: Loss of flow related signal within the right vertebral artery between the PICA origin and the basilar, however normally enhancing on the contrast enhanced MRI of the brain, uncertain significance. Patent right PICA. No left PICA identified within the field of view. IMPRESSION: MRI head: 1. 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 Positive for hemorrhage, Heidelberg calssification 1b: HI2, confluent petechiae, no mass effect. Mild local mass effect with partial effacement of fourth ventricle. No hydrocephalus at this time. 2. Mild chronic microvascular ischemic changes and volume loss of the brain. MRA head: 1. Loss of flow related signal within the right vertebral artery between the PICA origin and the basilar,  however, the vessel is normally enhancing on contrast enhanced MRI of the brain. The finding is of uncertain significance, possibly artifact. 2. Patent right PICA. No left PICA identified within the field of view. The stroke may be related to a left dominant PICA occlusion. CT angiography of the head and neck may better assess vascular anatomy. 3. Otherwise unremarkable MRA of the head. These results will be called to the ordering clinician or representative by the Radiologist Assistant, and communication documented in the PACS or zVision Dashboard. Electronically Signed   By: Kristine Garbe M.D.   On: 03/21/2019 23:46   Mr Jeri Cos RW Contrast  Result Date: 03/21/2019 CLINICAL DATA:  78 y/o  M; nausea and vomiting. EXAM: MRI HEAD WITHOUT CONTRAST MRA HEAD WITHOUT CONTRAST TECHNIQUE: Multiplanar, multiecho pulse sequences of the brain and surrounding structures were obtained without intravenous contrast. Angiographic images of the head were obtained using MRA technique without contrast. COMPARISON:  03/20/2019 CT head.  03/21/2019 carotid ultrasound. FINDINGS: MRI HEAD FINDINGS Brain: Reduced diffusion of cerebellar vermis and the medial inferior cerebellar hemispheres of bilaterally compatible with acute/early subacute infarction. There is mild local mass effect with partial effacement of the fourth ventricle and increased T2 FLAIR signal. There is confluent petechial hemorrhage throughout the infarct on susceptibility weighted sequences. There is faint linear enhancement within the area of infarction likely represent small vessel structures. No masslike enhancement. No additional findings for stroke, hemorrhage, extra-axial collection, hydrocephalus, or herniation. Punctate nonspecific T2 FLAIR hyperintensities in subcortical and periventricular white matter are compatible with mild chronic microvascular ischemic changes. Mild volume loss of the brain. Vascular: There is increased susceptibility  hypointensity throughout the area Skull and upper cervical spine: Normal  marrow signal. Sinuses/Orbits: Mild mucosal thickening of the ethmoid air cells. Additional included paranasal sinuses and mastoid air cells demonstrate normal signal. Bilateral intra-ocular lens replacement. Other: None. MRA HEAD FINDINGS Internal carotid arteries:  Patent. Anterior cerebral arteries:  Patent. Middle cerebral arteries: Patent. Anterior communicating artery: Patent. Posterior communicating arteries:  Patent. Posterior cerebral arteries:  Patent. Basilar artery:  Patent. Vertebral arteries: Loss of flow related signal within the right vertebral artery between the PICA origin and the basilar, however normally enhancing on the contrast enhanced MRI of the brain, uncertain significance. Patent right PICA. No left PICA identified within the field of view. IMPRESSION: MRI head: 1. 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 Positive for hemorrhage, [place] calssification 1b: HI2, confluent petechiae, no mass effect. Mild local mass effect with partial effacement of fourth ventricle. No hydrocephalus at this time. 2. Mild chronic microvascular ischemic changes and volume loss of the brain. MRA head: 1. Loss of flow related signal within the right vertebral artery between the PICA origin and the basilar, however, the vessel is normally enhancing on contrast enhanced MRI of the brain. The finding is of uncertain significance, possibly artifact. 2. Patent right PICA. No left PICA identified within the field of view. The stroke may be related to a left dominant PICA occlusion. CT angiography of the head and neck may better assess vascular anatomy. 3. Otherwise unremarkable MRA of the head. These results will be called to the ordering clinician or representative by the Radiologist Assistant, and communication documented in the PACS or zVision Dashboard.  Electronically Signed   By: Kristine Garbe M.D.   On: 03/21/2019 23:46   US Carotid Bilateral  Result Date: 03/21/2019 CLINICAL DATA:  Dizziness, hypertension, hyperlipidemia, diabetes, previous tobacco abuse EXAM: BILATERAL CAROTID DUPLEX ULTRASOUND TECHNIQUE: Pearline Cables scale imaging, color Doppler and duplex ultrasound were performed of bilateral carotid and vertebral arteries in the neck. COMPARISON:  None. FINDINGS: Criteria: Quantification of carotid stenosis is based on velocity parameters that correlate the residual internal carotid diameter with NASCET-based stenosis levels, using the diameter of the distal internal carotid lumen as the denominator for stenosis measurement. The following velocity measurements were obtained: RIGHT ICA: 131/17 cm/sec CCA: 119/14 cm/sec SYSTOLIC ICA/CCA RATIO:  1.2 ECA: 136 cm/sec LEFT ICA: 82/12 cm/sec CCA: 78/29 cm/sec SYSTOLIC ICA/CCA RATIO:  0.9 ECA: 167 cm/sec RIGHT CAROTID ARTERY: Eccentric calcified plaque in the bulb and ICA origin resulting in at least mild stenosis. Normal waveforms and color Doppler signal. RIGHT VERTEBRAL ARTERY:  Normal flow direction and waveform. LEFT CAROTID ARTERY: Mild eccentric partially calcified plaque at the bifurcation and in the proximal ICA. No high-grade stenosis. Normal waveforms and color Doppler signal. LEFT VERTEBRAL ARTERY:  Normal flow direction and waveform. IMPRESSION: 1. 1. 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 2. Antegrade bilateral vertebral arterial flow. Electronically Signed   By: Lucrezia Europe M.D.   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

## 2019-03-23 ENCOUNTER — Inpatient Hospital Stay: Payer: Medicare Other

## 2019-03-23 LAB — GLUCOSE, CAPILLARY
Glucose-Capillary: 142 mg/dL — ABNORMAL HIGH (ref 70–99)
Glucose-Capillary: 158 mg/dL — ABNORMAL HIGH (ref 70–99)
Glucose-Capillary: 181 mg/dL — ABNORMAL HIGH (ref 70–99)
Glucose-Capillary: 202 mg/dL — ABNORMAL HIGH (ref 70–99)

## 2019-03-23 LAB — LIPID PANEL
Cholesterol: 91 mg/dL (ref 0–200)
HDL: 30 mg/dL — ABNORMAL LOW (ref >40)
LDL Cholesterol: 46 mg/dL (ref 0–99)
Total CHOL/HDL Ratio: 3 RATIO
Triglycerides: 75 mg/dL (ref <150)
VLDL: 15 mg/dL (ref 0–40)

## 2019-03-23 LAB — CBC
HCT: 38.1 % — ABNORMAL LOW (ref 39.0–52.0)
Hemoglobin: 13.1 g/dL (ref 13.0–17.0)
MCH: 33.1 pg (ref 26.0–34.0)
MCHC: 34.4 g/dL (ref 30.0–36.0)
MCV: 96.2 fL (ref 80.0–100.0)
Platelets: 206 10*3/uL (ref 150–400)
RBC: 3.96 MIL/uL — ABNORMAL LOW (ref 4.22–5.81)
RDW: 12.5 % (ref 11.5–15.5)
WBC: 7.9 10*3/uL (ref 4.0–10.5)
nRBC: 0 % (ref 0.0–0.2)

## 2019-03-23 LAB — BASIC METABOLIC PANEL
Anion gap: 7 (ref 5–15)
BUN: 13 mg/dL (ref 8–23)
CO2: 23 mmol/L (ref 22–32)
Calcium: 8.6 mg/dL — ABNORMAL LOW (ref 8.9–10.3)
Chloride: 107 mmol/L (ref 98–111)
Creatinine, Ser: 0.78 mg/dL (ref 0.61–1.24)
GFR calc Af Amer: >60 mL/min (ref >60)
GFR calc non Af Amer: >60 mL/min (ref >60)
Glucose, Bld: 142 mg/dL — ABNORMAL HIGH (ref 70–99)
Potassium: 3.6 mmol/L (ref 3.5–5.1)
Sodium: 137 mmol/L (ref 135–145)

## 2019-03-23 LAB — MRSA PCR SCREENING: MRSA by PCR: NEGATIVE

## 2019-03-23 LAB — MAGNESIUM: Magnesium: 1.7 mg/dL (ref 1.7–2.4)

## 2019-03-23 MED ORDER — MAGNESIUM SULFATE 2 GM/50ML IV SOLN
2.0000 g | Freq: Once | INTRAVENOUS | Status: AC
  Administered 2019-03-23: 15:00:00 2 g via INTRAVENOUS
  Filled 2019-03-23: qty 50

## 2019-03-23 MED ORDER — POTASSIUM CHLORIDE 20 MEQ PO PACK
40.0000 meq | PACK | Freq: Two times a day (BID) | ORAL | Status: AC
  Administered 2019-03-23 (×2): 40 meq via ORAL
  Filled 2019-03-23 (×2): qty 2

## 2019-03-23 MED ORDER — POLYETHYLENE GLYCOL 3350 17 G PO PACK
17.0000 g | PACK | Freq: Once | ORAL | Status: AC
  Administered 2019-03-23: 17 g via ORAL
  Filled 2019-03-23: qty 1

## 2019-03-23 NOTE — Evaluation (Signed)
Occupational Therapy Evaluation Patient Details Name: Mathew Baker MRN: [id number] DOB: September 06, 1941 Today's Date: 03/23/2019    History of Present Illness pt is 78 y.o. male with a PMH notable for Type 2 DM, COPD, HTN, and Hyperlipidemia who presented to [facility] ED on 03/19/19 due to a 3 week history of intermittent dizziness, near syncopal episodes, and a one day history of nausea/vomiting.  in ED bradycardic with HR in the 30's. His SARS-CoV-2 PCR was negative. He was admitted to [facility] for further workup and treatment of symptomatic Bradycardia and near syncope with Cardiology consultation.  CT Head on 7/15 showed midline cerebellar hypodensity consistent with edema, therefore Neurology was consulted.  MRI of the brain showed bilateral inferior cerebellar infarcts with some 4th ventricular mass effect but no hydrocephalus, transferred to CCU 7/15 for close monitoring, repeat imaging indicated no evidence of hydrocephalus, Hypodensity from infarct remains with some infarct extension and small punctate focus of hemorrhage   Clinical Impression   Pt seen for OT evaluation this date. Prior to hospital admission, pt was active and independent in all aspects of ADL and functional mobility. He enjoyed gardening and staying active with his wife. Pt lives with his spouse in a level entry home. Currently pt demonstrates impairments in RUE/RLE strength, impaired balance, and decreased knowledge for use of DME/AE, impairing his ability to perform functional mobility and ADL tasks at baseline independence. Currently, pt requires Min-Mod A for balance while performing STS ADL tasks with LOB and difficulty self-correcting requiring assist from therapist. Pt is L hand dominant and able to perform grooming and self feeding tasks without difficulty. No significant coordination deficits, no sensory deficits. No significant cognitive or visual deficits appreciated with assessment on this date.  Pt is very motivated to  return to his independent PLOF. Family support at home. Pt would benefit from high-intensity skilled OT services to address noted impairments and functional limitations (see below for any additional details) in order to maximize safety and independence while minimizing falls risk and caregiver burden.  Upon hospital discharge, recommend pt discharge to CIR for continued therapy prior to discharge home with spouse.    Follow Up Recommendations  CIR    Equipment Recommendations  Other (comment)(TBD)    Recommendations for Other Services Rehab consult     Precautions / Restrictions Precautions Precautions: Fall Restrictions Weight Bearing Restrictions: No      Mobility Bed Mobility               General bed mobility comments: deferred up recliner  Transfers Overall transfer level: Needs assistance Equipment used: None Transfers: Sit to/from Stand Sit to Stand: Min assist         General transfer comment: Min A STS without AD for unsteadiness    Balance Overall balance assessment: Needs assistance Sitting-balance support: Feet supported Sitting balance-Leahy Scale: Fair     Standing balance support: No upper extremity supported;During functional activity Standing balance-Leahy Scale: Poor Standing balance comment: min-mod A x1 to maintain balance with weight shifts                           ADL either performed or assessed with clinical judgement   ADL Overall ADL's : Needs assistance/impaired Eating/Feeding: Independent   Grooming: Sitting;Independent   Upper Body Bathing: Sitting;Set up;Supervision/ safety   Lower Body Bathing: Sit to/from stand;Minimal assistance;Moderate assistance Lower Body Bathing Details (indicate cue type and reason): difficulty in performing while transitioning to and when  in standing 2/2 poor balance with LE weight shift Upper Body Dressing : Sitting;Supervision/safety;Set up   Lower Body Dressing: Sit to/from  stand;Minimal assistance;Moderate assistance Lower Body Dressing Details (indicate cue type and reason): difficulty in performing while transitioning to and when in standing 2/2 poor balance with LE weight shift; able to don/doff socks while in seated position without assist Toilet Transfer: Min guard;Minimal assistance;Ambulation;RW;BSC                   Vision Baseline Vision/History: Wears glasses Wears Glasses: Reading only Patient Visual Report: No change from baseline       Perception     Praxis      Pertinent Vitals/Pain Pain Assessment: No/denies pain     Hand Dominance Left   Extremity/Trunk Assessment Upper Extremity Assessment Upper Extremity Assessment: RUE deficits/detail(LUE WFL) RUE Deficits / Details: shoulder flexion grossly 4-/5, elbow flex/ext grossly 4/5, grip strength 4+/5; no significant coordination deficits noted RUE Sensation: WNL RUE Coordination: WNL LUE Deficits / Details: WFLs LUE Sensation: WNL LUE Coordination: WNL   Lower Extremity Assessment Lower Extremity Assessment: Defer to PT evaluation;RLE deficits/detail(LLE WFL) RLE Deficits / Details: weakness in hip flexion 4-/5, knee extension/flexion 4+/5, PF/DF 4+/5 RLE Sensation: WNL RLE Coordination: WNL LLE Deficits / Details: WFLs LLE Sensation: WNL LLE Coordination: decreased gross motor(very mild)   Cervical / Trunk Assessment Cervical / Trunk Assessment: Normal   Communication Communication Communication: No difficulties   Cognition Arousal/Alertness: Awake/alert Behavior During Therapy: WFL for tasks assessed/performed Overall Cognitive Status: Within Functional Limits for tasks assessed                                 General Comments: alert and oriented, follows all commands   General Comments       Exercises     Shoulder Instructions      Home Living Family/patient expects to be discharged to:: Private residence Living Arrangements:  Spouse/significant other Available Help at Discharge: Family;Other (Comment);Available 24 hours/day(daughter lives next door) Type of Home: House Home Access: Level entry     Home Layout: One level     Bathroom Shower/Tub: Occupational psychologist: Standard     Home Equipment: Shower seat - built in;Grab bars - tub/shower          Prior Functioning/Environment Level of Independence: Independent        Comments: Pt independent and active prior to hospitalization, no falls, enjoys gardening with his wife and "staying busy"        OT Problem List: Impaired balance (sitting and/or standing);Decreased strength;Decreased safety awareness;Decreased knowledge of use of DME or AE;Impaired UE functional use      OT Treatment/Interventions: Self-care/ADL training;Therapeutic exercise;Therapeutic activities;Neuromuscular education;DME and/or AE instruction;Patient/family education;Balance training    OT Goals(Current goals can be found in the care plan section) Acute Rehab OT Goals Patient Stated Goal: to return to PLOF OT Goal Formulation: With patient Time For Goal Achievement: 04/06/19 Potential to Achieve Goals: Good ADL Goals Pt Will Transfer to Toilet: with min guard assist;ambulating;regular height toilet(LRAD for amb) Additional ADL Goal #1: Pt will perform sit<>stand UB/LB dressing with supervision and no LOB.  OT Frequency: Min 3X/week   Barriers to D/C:            Co-evaluation              AM-PAC OT "6 Clicks" Daily Activity     Outcome  Measure Help from another person eating meals?: None Help from another person taking care of personal grooming?: None Help from another person toileting, which includes using toliet, bedpan, or urinal?: A Little Help from another person bathing (including washing, rinsing, drying)?: A Little Help from another person to put on and taking off regular upper body clothing?: A Little Help from another person to put on  and taking off regular lower body clothing?: A Little 6 Click Score: 20   End of Session Equipment Utilized During Treatment: Gait belt  Activity Tolerance: Patient tolerated treatment well Patient left: in chair;with call bell/phone within reach  OT Visit Diagnosis: Other abnormalities of gait and mobility (R26.89);Hemiplegia and hemiparesis Hemiplegia - Right/Left: Right Hemiplegia - dominant/non-dominant: Non-Dominant Hemiplegia - caused by: Cerebral infarction                Time: 1401-1415 OT Time Calculation (min): 14 min Charges:  OT General Charges $OT Visit: 1 Visit OT Evaluation $OT Eval Low Complexity: 1 Low  Jeni Salles, MPH, MS, OTR/L ascom [phone] 03/23/19, 4:09 PM

## 2019-03-23 NOTE — Progress Notes (Signed)
[facility] [place] NAME: Avik Leoni    MR#:  [id number]  DATE OF BIRTH:  14-Nov-1940  SUBJECTIVE:  CHIEF COMPLAINT: Patient is transferred to intensive care unit for cerebellar infarct.  Feeling fine no symptoms tolerating diet  REVIEW OF SYSTEMS:  CONSTITUTIONAL: No fever, fatigue or weakness.  EYES: No blurred or double vision.  EARS, NOSE, AND THROAT: No tinnitus or ear pain.  RESPIRATORY: No cough, shortness of breath, wheezing or hemoptysis.  CARDIOVASCULAR: No chest pain, orthopnea, edema.  GASTROINTESTINAL: No nausea, vomiting, diarrhea or abdominal pain.  GENITOURINARY: No dysuria, hematuria.  ENDOCRINE: No polyuria, nocturia,  HEMATOLOGY: No anemia, easy bruising or bleeding SKIN: No rash or lesion. MUSCULOSKELETAL: No joint pain or arthritis.   NEUROLOGIC: No tingling, numbness, weakness.  PSYCHIATRY: No anxiety or depression.   DRUG ALLERGIES:  No Known Allergies  VITALS:  Blood pressure 137/72, pulse 69, temperature 98.2 F (36.8 C), temperature source Oral, resp. rate 19, height 6\' 1"  (1.854 m), weight 100.1 kg, SpO2 100 %.  PHYSICAL EXAMINATION:  GENERAL:  78 y.o.-year-old patient lying in the bed with no acute distress.  EYES: Pupils equal, round, reactive to light and accommodation. No scleral icterus. Extraocular muscles intact.  HEENT: Head atraumatic, normocephalic. Oropharynx and nasopharynx clear.  NECK:  Supple, no jugular venous distention. No thyroid enlargement, no tenderness.  LUNGS: Normal breath sounds bilaterally, no wheezing, rales,rhonchi or crepitation. No use of accessory muscles of respiration.  CARDIOVASCULAR: S1, S2 normal. No murmurs, rubs, or gallops.  ABDOMEN: Soft, nontender, nondistended. Bowel sounds present.  EXTREMITIES: No pedal edema, cyanosis, or clubbing.  NEUROLOGIC: Cranial nerves II through XII are intact. Muscle strength 5/5 in all extremities, right upper extremity 4 out  of 5 sensation intact. Gait not checked.  PSYCHIATRIC: The patient is alert and oriented x 3.  SKIN: No obvious rash, lesion, or ulcer.    LABORATORY PANEL:   CBC Recent Labs  Lab 03/23/19 0503  WBC 7.9  HGB 13.1  HCT 38.1*  PLT 206   ------------------------------------------------------------------------------------------------------------------  Chemistries  Recent Labs  Lab 03/19/19 1108  03/23/19 0503  NA 136   < > 137  K 3.8   < > 3.6  CL 102   < > 107  CO2 24   < > 23  GLUCOSE 339*   < > 142*  BUN 15   < > 13  CREATININE 0.79   < > 0.78  CALCIUM 9.0   < > 8.6*  MG 1.8  --  1.7  AST 22  --   --   ALT 21  --   --   ALKPHOS 103  --   --   BILITOT 1.0  --   --    < > = values in this interval not displayed.   ------------------------------------------------------------------------------------------------------------------  Cardiac Enzymes No results for input(s): TROPONINI in the last 168 hours. ------------------------------------------------------------------------------------------------------------------  RADIOLOGY:  Ct Head Wo Contrast  Result Date: 03/23/2019 CLINICAL DATA:  Follow-up for possible intracranial hemorrhage. EXAM: CT HEAD WITHOUT CONTRAST TECHNIQUE: Contiguous axial images were obtained from the base of the skull through the vertex without intravenous contrast. COMPARISON:  03/22/2019, brain MRI, 03/21/2019 and head CTs, 03/20/2019 and 12/20/2016. FINDINGS: Brain: Hypoattenuation with minimal areas of punctate hyperattenuation in the inferior, midline cerebellum centered on the vermis, is similar to the CT from 03/20/2019. Is a small focus of hypoattenuation along the peripheral mid right cerebellar hemisphere, which appears new. Partial  effacement the fourth ventricle is again noted similar to the prior CT. Remaining ventricles are in size and configuration. Mild areas of white matter hypoattenuation are noted in the cerebral hemispheres  bilaterally consistent chronic microvascular ischemic change. No other evidence of recent infarction. Vascular: No hyperdense vessel or unexpected calcification. Skull: Normal. Negative for fracture or focal lesion. Sinuses/Orbits: No acute finding. Other: None. IMPRESSION: 1. Small punctate foci hyperattenuation lie within the area of hypoattenuation along the midline cerebellum, consistent with subacute infarction and small foci of petechial hemorrhage. This is unchanged from the previous CT scan. 2. Small area of hypoattenuation has developed in the mid right peripheral cerebellum, consistent a small focus of infarct extension. 3. No other change from prior studies. No other evidence intracranial hemorrhage. No hydrocephalus. Electronically Signed   By: Lajean Manes M.D.   On: 03/23/2019 08:55   Ct Head Wo Contrast  Result Date: 03/22/2019 CLINICAL DATA:  Follow-up stroke EXAM: CT HEAD WITHOUT CONTRAST TECHNIQUE: Contiguous axial images were obtained from the base of the skull through the vertex without intravenous contrast. COMPARISON:  MR brain, 03/21/2019, CT brain, 03/20/2019 FINDINGS: Brain: Redemonstrated hypodensity of the midline cerebellum and cerebellar vermis, with new, heterogeneous internal hyperdensity consistent with evolution of petechial hemorrhage appreciated on prior MRI (series 2, image 7). The fourth ventricle is partially effaced, as seen on prior MRI and CT, without evidence of developing hydrocephalus or significant change in caliber of the lateral and third ventricles. Underlying small-vessel white matter disease. Vascular: No hyperdense vessel or unexpected calcification. Skull: Normal. Negative for fracture or focal lesion. Sinuses/Orbits: No acute finding. Other: None. IMPRESSION: 1. Redemonstrated hypodensity of the midline cerebellum and cerebellar vermis, with new, heterogeneous internal hyperdensity consistent with evolution of petechial hemorrhage appreciated on prior MRI  (series 2, image 7). 2. The fourth ventricle is partially effaced, as seen on prior MRI and CT, without evidence of developing hydrocephalus or significant change in caliber of the lateral and third ventricles. 3.  Underlying small-vessel white matter disease. Electronically Signed   By: Eddie Candle M.D.   On: 03/22/2019 09:09   Mr Angio Head Wo Contrast  Result Date: 03/21/2019 CLINICAL DATA:  78 y/o  M; nausea and vomiting. EXAM: MRI HEAD WITHOUT CONTRAST MRA HEAD WITHOUT CONTRAST TECHNIQUE: Multiplanar, multiecho pulse sequences of the brain and surrounding structures were obtained without intravenous contrast. Angiographic images of the head were obtained using MRA technique without contrast. COMPARISON:  03/20/2019 CT head.  03/21/2019 carotid ultrasound. FINDINGS: MRI HEAD FINDINGS Brain: Reduced diffusion of cerebellar vermis and the medial inferior cerebellar hemispheres of bilaterally compatible with acute/early subacute infarction. There is mild local mass effect with partial effacement of the fourth ventricle and increased T2 FLAIR signal. There is confluent petechial hemorrhage throughout the infarct on susceptibility weighted sequences. There is faint linear enhancement within the area of infarction likely represent small vessel structures. No masslike enhancement. No additional findings for stroke, hemorrhage, extra-axial collection, hydrocephalus, or herniation. Punctate nonspecific T2 FLAIR hyperintensities in subcortical and periventricular white matter are compatible with mild chronic microvascular ischemic changes. Mild volume loss of the brain. Vascular: There is increased susceptibility hypointensity throughout the area Skull and upper cervical spine: Normal marrow signal. Sinuses/Orbits: Mild mucosal thickening of the ethmoid air cells. Additional included paranasal sinuses and mastoid air cells demonstrate normal signal. Bilateral intra-ocular lens replacement. Other: None. MRA HEAD  FINDINGS Internal carotid arteries:  Patent. Anterior cerebral arteries:  Patent. Middle cerebral arteries: Patent. Anterior communicating artery: Patent. Posterior  communicating arteries:  Patent. Posterior cerebral arteries:  Patent. Basilar artery:  Patent. Vertebral arteries: Loss of flow related signal within the right vertebral artery between the PICA origin and the basilar, however normally enhancing on the contrast enhanced MRI of the brain, uncertain significance. Patent right PICA. No left PICA identified within the field of view. IMPRESSION: MRI head: 1. 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 Positive for hemorrhage, [place] calssification 1b: HI2, confluent petechiae, no mass effect. Mild local mass effect with partial effacement of fourth ventricle. No hydrocephalus at this time. 2. Mild chronic microvascular ischemic changes and volume loss of the brain. MRA head: 1. Loss of flow related signal within the right vertebral artery between the PICA origin and the basilar, however, the vessel is normally enhancing on contrast enhanced MRI of the brain. The finding is of uncertain significance, possibly artifact. 2. Patent right PICA. No left PICA identified within the field of view. The stroke may be related to a left dominant PICA occlusion. CT angiography of the head and neck may better assess vascular anatomy. 3. Otherwise unremarkable MRA of the head. These results will be called to the ordering clinician or representative by the Radiologist Assistant, and communication documented in the PACS or zVision Dashboard. Electronically Signed   By: Kristine Garbe M.D.   On: 03/21/2019 23:46   Mr Jeri Cos EX Contrast  Result Date: 03/21/2019 CLINICAL DATA:  78 y/o  M; nausea and vomiting. EXAM: MRI HEAD WITHOUT CONTRAST MRA HEAD WITHOUT CONTRAST TECHNIQUE: Multiplanar, multiecho pulse sequences of the brain and  surrounding structures were obtained without intravenous contrast. Angiographic images of the head were obtained using MRA technique without contrast. COMPARISON:  03/20/2019 CT head.  03/21/2019 carotid ultrasound. FINDINGS: MRI HEAD FINDINGS Brain: Reduced diffusion of cerebellar vermis and the medial inferior cerebellar hemispheres of bilaterally compatible with acute/early subacute infarction. There is mild local mass effect with partial effacement of the fourth ventricle and increased T2 FLAIR signal. There is confluent petechial hemorrhage throughout the infarct on susceptibility weighted sequences. There is faint linear enhancement within the area of infarction likely represent small vessel structures. No masslike enhancement. No additional findings for stroke, hemorrhage, extra-axial collection, hydrocephalus, or herniation. Punctate nonspecific T2 FLAIR hyperintensities in subcortical and periventricular white matter are compatible with mild chronic microvascular ischemic changes. Mild volume loss of the brain. Vascular: There is increased susceptibility hypointensity throughout the area Skull and upper cervical spine: Normal marrow signal. Sinuses/Orbits: Mild mucosal thickening of the ethmoid air cells. Additional included paranasal sinuses and mastoid air cells demonstrate normal signal. Bilateral intra-ocular lens replacement. Other: None. MRA HEAD FINDINGS Internal carotid arteries:  Patent. Anterior cerebral arteries:  Patent. Middle cerebral arteries: Patent. Anterior communicating artery: Patent. Posterior communicating arteries:  Patent. Posterior cerebral arteries:  Patent. Basilar artery:  Patent. Vertebral arteries: Loss of flow related signal within the right vertebral artery between the PICA origin and the basilar, however normally enhancing on the contrast enhanced MRI of the brain, uncertain significance. Patent right PICA. No left PICA identified within the field of view. IMPRESSION: MRI  head: 1. 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 Positive for hemorrhage, [place] calssification 1b: HI2, confluent petechiae, no mass effect. Mild local mass effect with partial effacement of fourth ventricle. No hydrocephalus at this time. 2. Mild chronic microvascular ischemic changes and volume loss of the brain. MRA head: 1. Loss of flow related signal within  the right vertebral artery between the PICA origin and the basilar, however, the vessel is normally enhancing on contrast enhanced MRI of the brain. The finding is of uncertain significance, possibly artifact. 2. Patent right PICA. No left PICA identified within the field of view. The stroke may be related to a left dominant PICA occlusion. CT angiography of the head and neck may better assess vascular anatomy. 3. Otherwise unremarkable MRA of the head. These results will be called to the ordering clinician or representative by the Radiologist Assistant, and communication documented in the PACS or zVision Dashboard. Electronically Signed   By: Kristine Garbe M.D.   On: 03/21/2019 23:46    EKG:   Orders placed or performed during the hospital encounter of 12/20/16  . ED EKG  . ED EKG  . EKG 12-Lead  . EKG 12-Lead  . ED EKG  . ED EKG  . EKG 12-Lead  . EKG 12-Lead  . EKG  . 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 Doy Mince and neurosurgery Dr. Cari Caraway are following If patient develops worsening headache or worsening of imaging findings are altered mental status  need to be transferred to tertiary care center Repeat CT head today no evidence of hydrocephalus Neurology is recommending to continue  to hold anticoagulation and antiplatelet therapy Target A1c goal less than 7.0 Check lipid panel PT/OT/speech therapy assessment-CIR Repeat MRI of the brain.  Based on MRI results the start of antiplatelet therapy will be considered   #.   Dizziness probably Symptomatic bradycardia patient does not appear to be on any heart  rate lowering medications Asymptomatic Cardiology is following, heart rate is stable at around 50s We will monitor on telemetry Echocardiogram of the heart-60 to 65% ejection fraction, normal systolic function  -stress test in May 2018 has revealed normal LV function with EF estimated at 57% no evidence of stress-induced ischemia -Carotid Dopplers with no significant stenosis  2.  Diabetes type 2 we will continue Lantus hold metformin due to nausea, I will also hold Glucotrol XL Dietitian-provided diet education   3.  Hyperlipidemia continue Lipitor  4.  Glaucoma continue eyedrops  5.  Hypertension continue lisinopril monitor blood pressure  6.  Acute gastritis we will check GI panel PPI.  H. pylori negative  PT consult  All the records are reviewed and case discussed with Care Management/Social Workerr. Management plans discussed with the patient, try to reach patient's wife at [phone] could not reach her and voicemail box set up  CODE STATUS: fc  TOTAL TIME TAKING CARE OF THIS PATIENT: 36  minutes.   POSSIBLE D/C IN  DAYS, DEPENDING ON CLINICAL CONDITION.  Note: This dictation was prepared with Dragon dictation along with smaller phrase technology. Any transcriptional errors that result from this process are unintentional.   Nicholes Mango M.D on 03/23/2019 at 3:46 PM  Between 7am to 6pm - Pager - [phone] After 6pm go to [email or web address] - password [email or web address] [facility]  Office  [phone]  CC: Primary care physician; Patient, No Pcp Per

## 2019-03-23 NOTE — Progress Notes (Addendum)
Subjective: Patient remains stable.  No new neurological complaints.    Objective: Current vital signs: BP (!) 167/78   Pulse 72   Temp 97.9 F (36.6 C) (Oral)   Resp 16   Ht 6\' 1"  (1.854 m)   Wt 100.1 kg   SpO2 99%   BMI 29.12 kg/m  Vital signs in last 24 hours: Temp:  [97.9 F (36.6 C)-98.8 F (37.1 C)] 97.9 F (36.6 C) (07/17 0751) Pulse Rate:  [47-112] 72 (07/17 1200) Resp:  [8-23] 16 (07/17 1200) BP: (119-169)/(67-91) 167/78 (07/17 1200) SpO2:  [95 %-100 %] 99 % (07/17 1200)  Intake/Output from previous day: 07/16 0701 - 07/17 0700 In: 2033.5 [P.O.:240; I.V.:1793.5] Out: 2350 [Urine:2050; Emesis/NG output:300] Intake/Output this shift: Total I/O In: 240 [P.O.:240] Out: 550 [Urine:550] Nutritional status:  Diet Order            Diet heart healthy/carb modified Room service appropriate? Yes; Fluid consistency: Thin  Diet effective now              Neurologic Exam: Mental Status: Alert, oriented, thought content appropriate.  Speech fluent without evidence of aphasia.  Able to follow 3 step commands without difficulty. Cranial Nerves: II: Discs flat bilaterally; Visual fields grossly normal, pupils equal, round, reactive to light and accommodation III,IV, VI: ptosis not present, extra-ocular motions intact bilaterally V,VII: smile symmetric, facial light touch sensation normal bilaterally VIII: hearing normal bilaterally IX,X: gag reflex present XI: bilateral shoulder shrug XII: midline tongue extension Motor: Right :  Upper extremity   4/5                                      Left:     Upper extremity   5/5             Lower extremity   5/5                                                  Lower extremity   5/5 Tone and bulk:normal tone throughout; no atrophy noted Sensory: Pinprick and light touch intact throughout, bilaterally   Lab Results: Basic Metabolic Panel: Recent Labs  Lab 03/19/19 1108 03/20/19 0535 03/21/19 0459 03/22/19 0351  03/23/19 0503  NA 136 137 138 136 137  K 3.8 3.7 3.2* 3.5 3.6  CL 102 106 109 106 107  CO2 24 21* 24 22 23   GLUCOSE 339* 195* 115* 167* 142*  BUN 15 16 13 14 13   CREATININE 0.79 0.69 0.67 0.64 0.78  CALCIUM 9.0 8.5* 8.3* 8.6* 8.6*  MG 1.8  --   --   --  1.7    Liver Function Tests: Recent Labs  Lab 03/19/19 1108  AST 22  ALT 21  ALKPHOS 103  BILITOT 1.0  PROT 7.5  ALBUMIN 4.0   Recent Labs  Lab 03/19/19 1108  LIPASE 40   No results for input(s): AMMONIA in the last 168 hours.  CBC: Recent Labs  Lab 03/19/19 1108 03/20/19 0535 03/21/19 0459 03/22/19 0351 03/23/19 0503  WBC 11.8* 13.9* 10.0 10.6* 7.9  NEUTROABS 9.1*  --   --   --   --   HGB 14.6 13.4 13.4 13.2 13.1  HCT 42.6 38.1* 39.8 38.3* 38.1*  MCV 96.4 95.7 96.4  95.8 96.2  PLT 214 224 222 218 206    Cardiac Enzymes: No results for input(s): CKTOTAL, CKMB, CKMBINDEX, TROPONINI in the last 168 hours.  Lipid Panel: Recent Labs  Lab 03/23/19 0503  CHOL 91  TRIG 75  HDL 30*  CHOLHDL 3.0  VLDL 15  LDLCALC 46    CBG: Recent Labs  Lab 03/22/19 1145 03/22/19 1559 03/22/19 2146 03/23/19 0736 03/23/19 1150  GLUCAP 190* 240* 137* 142* 158*    Microbiology: Results for orders placed or performed during the hospital encounter of 03/19/19  SARS Coronavirus 2 (CEPHEID - Performed in [facility] hospital lab), Hosp Order     Status: None   Collection Time: 03/19/19 11:09 AM   Specimen: Nasopharyngeal Swab  Result Value Ref Range Status   SARS Coronavirus 2 NEGATIVE NEGATIVE Final    Comment: (NOTE) If result is NEGATIVE SARS-CoV-2 target nucleic acids are NOT DETECTED. The SARS-CoV-2 RNA is generally detectable in upper and lower  respiratory specimens during the acute phase of infection. The lowest  concentration of SARS-CoV-2 viral copies this assay can detect is 250  copies / mL. A negative result does not preclude SARS-CoV-2 infection  and should not be used as the sole basis for treatment  or other  patient management decisions.  A negative result may occur with  improper specimen collection / handling, submission of specimen other  than nasopharyngeal swab, presence of viral mutation(s) within the  areas targeted by this assay, and inadequate number of viral copies  (<250 copies / mL). A negative result must be combined with clinical  observations, patient history, and epidemiological information. If result is POSITIVE SARS-CoV-2 target nucleic acids are DETECTED. The SARS-CoV-2 RNA is generally detectable in upper and lower  respiratory specimens dur ing the acute phase of infection.  Positive  results are indicative of active infection with SARS-CoV-2.  Clinical  correlation with patient history and other diagnostic information is  necessary to determine patient infection status.  Positive results do  not rule out bacterial infection or co-infection with other viruses. If result is PRESUMPTIVE POSTIVE SARS-CoV-2 nucleic acids MAY BE PRESENT.   A presumptive positive result was obtained on the submitted specimen  and confirmed on repeat testing.  While 2019 novel coronavirus  (SARS-CoV-2) nucleic acids may be present in the submitted sample  additional confirmatory testing may be necessary for epidemiological  and / or clinical management purposes  to differentiate between  SARS-CoV-2 and other Sarbecovirus currently known to infect humans.  If clinically indicated additional testing with an alternate test  methodology [phone]) is advised. The SARS-CoV-2 RNA is generally  detectable in upper and lower respiratory sp ecimens during the acute  phase of infection. The expected result is Negative. Fact Sheet for Patients:  StrictlyIdeas.no Fact Sheet for Healthcare Providers: BankingDealers.co.za This test is not yet approved or cleared by the [place] FDA and has been authorized for detection and/or diagnosis of  SARS-CoV-2 by FDA under an Emergency Use Authorization (EUA).  This EUA will remain in effect (meaning this test can be used) for the duration of the COVID-19 declaration under Section 564(b)(1) of the Act, 21 U.S.C. section 360bbb-3(b)(1), unless the authorization is terminated or revoked sooner. Performed at [facility], [address] [place], [place] [address]     Coagulation Studies: No results for input(s): LABPROT, INR in the last 72 hours.  Imaging: Ct Head Wo Contrast  Result Date: 03/23/2019 CLINICAL DATA:  Follow-up for possible intracranial hemorrhage.  EXAM: CT HEAD WITHOUT CONTRAST TECHNIQUE: Contiguous axial images were obtained from the base of the skull through the vertex without intravenous contrast. COMPARISON:  03/22/2019, brain MRI, 03/21/2019 and head CTs, 03/20/2019 and 12/20/2016. FINDINGS: Brain: Hypoattenuation with minimal areas of punctate hyperattenuation in the inferior, midline cerebellum centered on the vermis, is similar to the CT from 03/20/2019. Is a small focus of hypoattenuation along the peripheral mid right cerebellar hemisphere, which appears new. Partial effacement the fourth ventricle is again noted similar to the prior CT. Remaining ventricles are in size and configuration. Mild areas of white matter hypoattenuation are noted in the cerebral hemispheres bilaterally consistent chronic microvascular ischemic change. No other evidence of recent infarction. Vascular: No hyperdense vessel or unexpected calcification. Skull: Normal. Negative for fracture or focal lesion. Sinuses/Orbits: No acute finding. Other: None. IMPRESSION: 1. Small punctate foci hyperattenuation lie within the area of hypoattenuation along the midline cerebellum, consistent with subacute infarction and small foci of petechial hemorrhage. This is unchanged from the previous CT scan. 2. Small area of hypoattenuation has developed in the mid right peripheral cerebellum, consistent a  small focus of infarct extension. 3. No other change from prior studies. No other evidence intracranial hemorrhage. No hydrocephalus. Electronically Signed   By: Lajean Manes M.D.   On: 03/23/2019 08:55   Ct Head Wo Contrast  Result Date: 03/22/2019 CLINICAL DATA:  Follow-up stroke EXAM: CT HEAD WITHOUT CONTRAST TECHNIQUE: Contiguous axial images were obtained from the base of the skull through the vertex without intravenous contrast. COMPARISON:  MR brain, 03/21/2019, CT brain, 03/20/2019 FINDINGS: Brain: Redemonstrated hypodensity of the midline cerebellum and cerebellar vermis, with new, heterogeneous internal hyperdensity consistent with evolution of petechial hemorrhage appreciated on prior MRI (series 2, image 7). The fourth ventricle is partially effaced, as seen on prior MRI and CT, without evidence of developing hydrocephalus or significant change in caliber of the lateral and third ventricles. Underlying small-vessel white matter disease. Vascular: No hyperdense vessel or unexpected calcification. Skull: Normal. Negative for fracture or focal lesion. Sinuses/Orbits: No acute finding. Other: None. IMPRESSION: 1. Redemonstrated hypodensity of the midline cerebellum and cerebellar vermis, with new, heterogeneous internal hyperdensity consistent with evolution of petechial hemorrhage appreciated on prior MRI (series 2, image 7). 2. The fourth ventricle is partially effaced, as seen on prior MRI and CT, without evidence of developing hydrocephalus or significant change in caliber of the lateral and third ventricles. 3.  Underlying small-vessel white matter disease. Electronically Signed   By: Eddie Candle M.D.   On: 03/22/2019 09:09   Mr Angio Head Wo Contrast  Result Date: 03/21/2019 CLINICAL DATA:  78 y/o  M; nausea and vomiting. EXAM: MRI HEAD WITHOUT CONTRAST MRA HEAD WITHOUT CONTRAST TECHNIQUE: Multiplanar, multiecho pulse sequences of the brain and surrounding structures were obtained without  intravenous contrast. Angiographic images of the head were obtained using MRA technique without contrast. COMPARISON:  03/20/2019 CT head.  03/21/2019 carotid ultrasound. FINDINGS: MRI HEAD FINDINGS Brain: Reduced diffusion of cerebellar vermis and the medial inferior cerebellar hemispheres of bilaterally compatible with acute/early subacute infarction. There is mild local mass effect with partial effacement of the fourth ventricle and increased T2 FLAIR signal. There is confluent petechial hemorrhage throughout the infarct on susceptibility weighted sequences. There is faint linear enhancement within the area of infarction likely represent small vessel structures. No masslike enhancement. No additional findings for stroke, hemorrhage, extra-axial collection, hydrocephalus, or herniation. Punctate nonspecific T2 FLAIR hyperintensities in subcortical and periventricular white matter are compatible with  mild chronic microvascular ischemic changes. Mild volume loss of the brain. Vascular: There is increased susceptibility hypointensity throughout the area Skull and upper cervical spine: Normal marrow signal. Sinuses/Orbits: Mild mucosal thickening of the ethmoid air cells. Additional included paranasal sinuses and mastoid air cells demonstrate normal signal. Bilateral intra-ocular lens replacement. Other: None. MRA HEAD FINDINGS Internal carotid arteries:  Patent. Anterior cerebral arteries:  Patent. Middle cerebral arteries: Patent. Anterior communicating artery: Patent. Posterior communicating arteries:  Patent. Posterior cerebral arteries:  Patent. Basilar artery:  Patent. Vertebral arteries: Loss of flow related signal within the right vertebral artery between the PICA origin and the basilar, however normally enhancing on the contrast enhanced MRI of the brain, uncertain significance. Patent right PICA. No left PICA identified within the field of view. IMPRESSION: MRI head: 1. Acute/early subacute infarction  involving the inferior cerebellum and medial aspect of bilateral cerebellar hemispheres. Positive for hemorrhage, [place] calssification 1b: HI2, confluent petechiae, no mass effect. Mild local mass effect with partial effacement of fourth ventricle. No hydrocephalus at this time. 2. Mild chronic microvascular ischemic changes and volume loss of the brain. MRA head: 1. Loss of flow related signal within the right vertebral artery between the PICA origin and the basilar, however, the vessel is normally enhancing on contrast enhanced MRI of the brain. The finding is of uncertain significance, possibly artifact. 2. Patent right PICA. No left PICA identified within the field of view. The stroke may be related to a left dominant PICA occlusion. CT angiography of the head and neck may better assess vascular anatomy. 3. Otherwise unremarkable MRA of the head. These results will be called to the ordering clinician or representative by the Radiologist Assistant, and communication documented in the PACS or zVision Dashboard. Electronically Signed   By: Kristine Garbe M.D.   On: 03/21/2019 23:46   Mr Jeri Cos WU Contrast  Result Date: 03/21/2019 CLINICAL DATA:  78 y/o  M; nausea and vomiting. EXAM: MRI HEAD WITHOUT CONTRAST MRA HEAD WITHOUT CONTRAST TECHNIQUE: Multiplanar, multiecho pulse sequences of the brain and surrounding structures were obtained without intravenous contrast. Angiographic images of the head were obtained using MRA technique without contrast. COMPARISON:  03/20/2019 CT head.  03/21/2019 carotid ultrasound. FINDINGS: MRI HEAD FINDINGS Brain: Reduced diffusion of cerebellar vermis and the medial inferior cerebellar hemispheres of bilaterally compatible with acute/early subacute infarction. There is mild local mass effect with partial effacement of the fourth ventricle and increased T2 FLAIR signal. There is confluent petechial hemorrhage throughout the infarct on susceptibility weighted  sequences. There is faint linear enhancement within the area of infarction likely represent small vessel structures. No masslike enhancement. No additional findings for stroke, hemorrhage, extra-axial collection, hydrocephalus, or herniation. Punctate nonspecific T2 FLAIR hyperintensities in subcortical and periventricular white matter are compatible with mild chronic microvascular ischemic changes. Mild volume loss of the brain. Vascular: There is increased susceptibility hypointensity throughout the area Skull and upper cervical spine: Normal marrow signal. Sinuses/Orbits: Mild mucosal thickening of the ethmoid air cells. Additional included paranasal sinuses and mastoid air cells demonstrate normal signal. Bilateral intra-ocular lens replacement. Other: None. MRA HEAD FINDINGS Internal carotid arteries:  Patent. Anterior cerebral arteries:  Patent. Middle cerebral arteries: Patent. Anterior communicating artery: Patent. Posterior communicating arteries:  Patent. Posterior cerebral arteries:  Patent. Basilar artery:  Patent. Vertebral arteries: Loss of flow related signal within the right vertebral artery between the PICA origin and the basilar, however normally enhancing on the contrast enhanced MRI of the brain, uncertain significance. Patent right  PICA. No left PICA identified within the field of view. IMPRESSION: MRI head: 1. 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 Positive for hemorrhage, [place] calssification 1b: HI2, confluent petechiae, no mass effect. Mild local mass effect with partial effacement of fourth ventricle. No hydrocephalus at this time. 2. Mild chronic microvascular ischemic changes and volume loss of the brain. MRA head: 1. Loss of flow related signal within the right vertebral artery between the PICA origin and the basilar, however, the vessel is normally enhancing on contrast enhanced MRI of the brain. The  finding is of uncertain significance, possibly artifact. 2. Patent right PICA. No left PICA identified within the field of view. The stroke may be related to a left dominant PICA occlusion. CT angiography of the head and neck may better assess vascular anatomy. 3. Otherwise unremarkable MRA of the head. These results will be called to the ordering clinician or representative by the Radiologist Assistant, and communication documented in the PACS or zVision Dashboard. Electronically Signed   By: Kristine Garbe M.D.   On: 03/21/2019 23:46   US Carotid Bilateral  Result Date: 03/21/2019 CLINICAL DATA:  Dizziness, hypertension, hyperlipidemia, diabetes, previous tobacco abuse EXAM: BILATERAL CAROTID DUPLEX ULTRASOUND TECHNIQUE: Pearline Cables scale imaging, color Doppler and duplex ultrasound were performed of bilateral carotid and vertebral arteries in the neck. COMPARISON:  None. FINDINGS: Criteria: Quantification of carotid stenosis is based on velocity parameters that correlate the residual internal carotid diameter with NASCET-based stenosis levels, using the diameter of the distal internal carotid lumen as the denominator for stenosis measurement. The following velocity measurements were obtained: RIGHT ICA: 131/17 cm/sec CCA: 967/59 cm/sec SYSTOLIC ICA/CCA RATIO:  1.2 ECA: 136 cm/sec LEFT ICA: 82/12 cm/sec CCA: 16/38 cm/sec SYSTOLIC ICA/CCA RATIO:  0.9 ECA: 167 cm/sec RIGHT CAROTID ARTERY: Eccentric calcified plaque in the bulb and ICA origin resulting in at least mild stenosis. Normal waveforms and color Doppler signal. RIGHT VERTEBRAL ARTERY:  Normal flow direction and waveform. LEFT CAROTID ARTERY: Mild eccentric partially calcified plaque at the bifurcation and in the proximal ICA. No high-grade stenosis. Normal waveforms and color Doppler signal. LEFT VERTEBRAL ARTERY:  Normal flow direction and waveform. IMPRESSION: 1. 1. 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 2. Antegrade bilateral vertebral arterial flow. Electronically Signed   By: Lucrezia Europe M.D.   On: 03/21/2019 16:18    Medications:  I have reviewed the patient's current medications. Scheduled: . atorvastatin  40 mg Oral QHS  . Chlorhexidine Gluconate Cloth  6 each Topical Q0600  . docusate sodium  100 mg Oral Daily  . gabapentin  300 mg Oral QHS  . guaiFENesin  600 mg Oral BID  . insulin aspart  0-5 Units Subcutaneous QHS  . insulin aspart  0-9 Units Subcutaneous TID WC  . insulin glargine  20 Units Subcutaneous BID  . latanoprost  1 drop Both Eyes QHS  . lisinopril  40 mg Oral Daily  . magnesium oxide  400 mg Oral Daily  . potassium chloride  40 mEq Oral BID  . tamsulosin  0.4 mg Oral QHS  . tiotropium  18 mcg Inhalation Once per day on Mon Wed Fri    Assessment/Plan: Patient with cerebellar infarct and surrounding edema.  Unclear onset, possibly 7/13.  Repeat head CT shows no evidence of hydrocephalus.  Hypodensity from infarct remains with some infarct extension and small punctate focus of hemorrhage.   Recommendations: 1. Continue frequent neuro  checks 2. Repeat MRI in AM.  Will make decision at that time about restart of antiplatelet therapy, etc.      LOS: 3 days   Alexis Goodell, MD Neurology [phone] 03/23/2019  1:20 PM

## 2019-03-23 NOTE — Consult Note (Signed)
PHARMACY CONSULT NOTE - FOLLOW UP  Pharmacy Consult for Electrolyte Monitoring and Replacement   Recent Labs: Potassium (mmol/L)  Date Value  03/23/2019 3.6   Magnesium (mg/dL)  Date Value  03/23/2019 1.7   Calcium (mg/dL)  Date Value  03/23/2019 8.6 (L)   Albumin (g/dL)  Date Value  03/19/2019 4.0   Sodium (mmol/L)  Date Value  03/23/2019 137  07/15/2014 136 (A)    Assessment: 78 year old presenting with dizziness nausea vomiting. She was brought to CCU with an acute cerebellar infarct with mild to moderate swelling but without hydrocephalus  Goal of Therapy:  Potassium > 4 mmol/L Magnesium > 2 mg/dL Phosphorous > 3 mg/dL Calcium > 7.5 mg/dL  Plan:   Replace potassium with oral KCl 40 mEq BID x2  Replace magnesium with magnesium sulfate 2 grams IV x 1  Follow-up electrolytes in am and replace as needed  Dallie Piles ,PharmD Clinical Pharmacist 03/23/2019 12:19 PM

## 2019-03-23 NOTE — Evaluation (Signed)
Physical Therapy Evaluation Patient Details Name: Mathew Baker MRN: [id number] DOB: 1940-10-15 Today's Date: 03/23/2019   History of Present Illness  pt is 78 y.o. male with a PMH notable for Type 2 DM, COPD, HTN, and Hyperlipidemia who presented to [facility] ED on 03/19/19 due to a 3 week history of intermittent dizziness, near syncopal episodes, and a one day history of nausea/vomiting.  in ED bradycardic with HR in the 30's. His SARS-CoV-2 PCR was negative. He was admitted to [place] unit for further workup and treatment of symptomatic Bradycardia and near syncope with Cardiology consultation.  CT Head on 7/15 showed midline cerebellar hypodensity consistent with edema, therefore Neurology was consulted.  MRI of the brain showed bilateral inferior cerebellar infarcts with some 4th ventricular mass effect but no hydrocephalus, transferred to CCU 7/15 for close monitoring, repeat imaging indicated no evidence of hydrocephalus, Hypodensity from infarct remains with some infarct extension and small punctate focus of hemorrhage    Clinical Impression  Patient alert, orientedx4, denied any pain. Pt was able to provide clear PLOF, prior to admission pt was independent in ADLs/IADLs, driving, cooking/cleaning, etc. Pt lives with his wife and his daughter lives next door. Pt highly motivated to return to PLOF and eager to work with physical therapy.  Upon assessment patient demonstrated deficits that impede functional activities. Exhibited limited RUE strength compared to LUE, as well as mild deficits in R hip flexion. May demonstrate mild impairments in gross coordination testing for LE especially on RLE, no deficits in light touch sensation. Pt demonstrated transfers with minA for mild impulsivity and unsteadiness, ambulated ~58ft with minAx2, exhibited difficulty with reciprocal gait pattern, coordinated step length, unsteadiness, and had moderate deviations in gait pathway. Recommend transition to acute  inpatient rehab upon discharge for high-intensity, post-acute rehab services, pt would be an excellent candidate especially with his motivation and family support.       Follow Up Recommendations CIR    Equipment Recommendations  Rolling walker with 5" wheels    Recommendations for Other Services OT consult     Precautions / Restrictions Precautions Precautions: Fall Restrictions Weight Bearing Restrictions: No      Mobility  Bed Mobility               General bed mobility comments: deferred up recliner  Transfers Overall transfer level: Needs assistance Equipment used: Rolling walker (2 wheeled);None Transfers: Sit to/from Stand Sit to Stand: Min guard;Min assist;+2 safety/equipment         General transfer comment: CGA with RW, minA without AD for unsteadiness and slight impulsivity  Ambulation/Gait Ambulation/Gait assistance: Min assist;+2 physical assistance Gait Distance (Feet): 35 Feet Assistive device: Rolling walker (2 wheeled);2 person hand held assist   Gait velocity: decreased   General Gait Details: gait performed with and without RW, minAx2 to maintain pt balance and safety, pt slightly impulsive with movements as well. Lack of coordinated movements noted  Stairs            Wheelchair Mobility    Modified Rankin (Stroke Patients Only)       Balance Overall balance assessment: Needs assistance Sitting-balance support: Feet supported Sitting balance-Leahy Scale: Fair       Standing balance-Leahy Scale: Poor Standing balance comment: minAx2 to maintain midline positioning and pt safety                             Pertinent Vitals/Pain Pain Assessment: No/denies pain  Home Living Family/patient expects to be discharged to:: Private residence Living Arrangements: Spouse/significant other Available Help at Discharge: Family;Other (Comment)(daughter lives next door) Type of Home: House Home Access: Level entry      Home Layout: One level Home Equipment: Shower seat - built in;Grab bars - tub/shower      Prior Function Level of Independence: Independent               Hand Dominance   Dominant Hand: Left    Extremity/Trunk Assessment   Upper Extremity Assessment Upper Extremity Assessment: Defer to OT evaluation;RUE deficits/detail;LUE deficits/detail RUE Deficits / Details: defer to OT evaluation, however R sided UE weakness noted about exam ( R shoulder/elbow especially) RUE Sensation: WNL LUE Deficits / Details: WFLs LUE Sensation: WNL LUE Coordination: WNL    Lower Extremity Assessment Lower Extremity Assessment: RLE deficits/detail;LLE deficits/detail RLE Deficits / Details: weakness in hip flexion 4-/5, knee extension/flexion 4+/5, PF/DF 4+/5 RLE Sensation: WNL RLE Coordination: WNL LLE Deficits / Details: WFLs LLE Sensation: WNL LLE Coordination: decreased gross motor(very mild)    Cervical / Trunk Assessment Cervical / Trunk Assessment: Normal  Communication   Communication: No difficulties  Cognition Arousal/Alertness: Awake/alert Behavior During Therapy: WFL for tasks assessed/performed Overall Cognitive Status: Within Functional Limits for tasks assessed                                        General Comments      Exercises     Assessment/Plan    PT Assessment Patient needs continued PT services  PT Problem List Decreased strength;Decreased mobility;Decreased activity tolerance;Decreased range of motion;Decreased balance;Decreased safety awareness;Decreased knowledge of use of DME;Decreased knowledge of precautions       PT Treatment Interventions DME instruction;Therapeutic exercise;Gait training;Balance training;Stair training;Neuromuscular re-education;Functional mobility training;Therapeutic activities;Patient/family education    PT Goals (Current goals can be found in the Care Plan section)  Acute Rehab PT Goals Patient Stated  Goal: to return to PLOF PT Goal Formulation: With patient Time For Goal Achievement: 04/06/19 Potential to Achieve Goals: Good    Frequency 7X/week   Barriers to discharge        Co-evaluation               AM-PAC PT "6 Clicks" Mobility  Outcome Measure Help needed turning from your back to your side while in a flat bed without using bedrails?: A Little Help needed moving from lying on your back to sitting on the side of a flat bed without using bedrails?: A Little Help needed moving to and from a bed to a chair (including a wheelchair)?: A Little Help needed standing up from a chair using your arms (e.g., wheelchair or bedside chair)?: A Little Help needed to walk in hospital room?: A Little Help needed climbing 3-5 steps with a railing? 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

## 2019-03-23 NOTE — Progress Notes (Signed)
Rehab Admissions Coordinator Note:  Per PT recommendation, this patient was screened by Jhonnie Garner for appropriateness for an Inpatient Acute Rehab Consult.  At this time, we are recommending Inpatient Rehab consult. Please have Attending MD place consult order if you would for this patient to be considered for CIR.   Jhonnie Garner 03/23/2019, 3:26 PM  I can be reached at [phone].

## 2019-03-24 ENCOUNTER — Inpatient Hospital Stay: Payer: Medicare Other

## 2019-03-24 LAB — RENAL FUNCTION PANEL
Albumin: 3.4 g/dL — ABNORMAL LOW (ref 3.5–5.0)
Anion gap: 6 (ref 5–15)
BUN: 16 mg/dL (ref 8–23)
CO2: 27 mmol/L (ref 22–32)
Calcium: 8.9 mg/dL (ref 8.9–10.3)
Chloride: 103 mmol/L (ref 98–111)
Creatinine, Ser: 0.81 mg/dL (ref 0.61–1.24)
GFR calc Af Amer: >60 mL/min (ref >60)
GFR calc non Af Amer: >60 mL/min (ref >60)
Glucose, Bld: 163 mg/dL — ABNORMAL HIGH (ref 70–99)
Phosphorus: 4.3 mg/dL (ref 2.5–4.6)
Potassium: 4.5 mmol/L (ref 3.5–5.1)
Sodium: 136 mmol/L (ref 135–145)

## 2019-03-24 LAB — GLUCOSE, CAPILLARY
Glucose-Capillary: 139 mg/dL — ABNORMAL HIGH (ref 70–99)
Glucose-Capillary: 154 mg/dL — ABNORMAL HIGH (ref 70–99)
Glucose-Capillary: 222 mg/dL — ABNORMAL HIGH (ref 70–99)
Glucose-Capillary: 278 mg/dL — ABNORMAL HIGH (ref 70–99)
Glucose-Capillary: 160 mg/dL — ABNORMAL HIGH (ref 70–99)

## 2019-03-24 LAB — MAGNESIUM: Magnesium: 2 mg/dL (ref 1.7–2.4)

## 2019-03-24 MED ORDER — ASPIRIN 325 MG PO TABS
325.0000 mg | ORAL_TABLET | Freq: Every day | ORAL | Status: DC
  Administered 2019-03-24 – 2019-03-27 (×4): 325 mg via ORAL
  Filled 2019-03-24 (×5): qty 1

## 2019-03-24 NOTE — Progress Notes (Signed)
Subjective: Patient remains stable.  No new neurological complaints.    Objective: Current vital signs: BP (!) 156/79   Pulse 64   Temp 97.6 F (36.4 C) (Axillary)   Resp 13   Ht 6\' 1"  (1.854 m)   Wt 100.1 kg   SpO2 100%   BMI 29.12 kg/m  Vital signs in last 24 hours: Temp:  [97.6 F (36.4 C)-98.2 F (36.8 C)] 97.6 F (36.4 C) (07/18 0759) Pulse Rate:  [53-87] 64 (07/18 0800) Resp:  [12-21] 13 (07/18 0800) BP: (113-167)/(50-124) 156/79 (07/18 0700) SpO2:  [95 %-100 %] 100 % (07/18 0800)  Intake/Output from previous day: 07/17 0701 - 07/18 0700 In: 240 [P.O.:240] Out: 2250 [Urine:2250] Intake/Output this shift: Total I/O In: 240 [P.O.:240] Out: 450 [Urine:450] Nutritional status:  Diet Order            Diet heart healthy/carb modified Room service appropriate? Yes; Fluid consistency: Thin  Diet effective now              Neurologic Exam: Mental Status: Alert, oriented, thought content appropriate.  Speech fluent without evidence of aphasia.  Able to follow 3 step commands without difficulty. Cranial Nerves: II: Discs flat bilaterally; Visual fields grossly normal, pupils equal, round, reactive to light and accommodation III,IV, VI: ptosis not present, extra-ocular motions intact bilaterally V,VII: smile symmetric, facial light touch sensation normal bilaterally VIII: hearing normal bilaterally IX,X: gag reflex present XI: bilateral shoulder shrug XII: midline tongue extension Motor: Right :  Upper extremity   4/5                                      Left:     Upper extremity   5/5             Lower extremity   5/5                                                  Lower extremity   5/5 Tone and bulk:normal tone throughout; no atrophy noted Sensory: Pinprick and light touch intact throughout, bilaterally   Lab Results: Basic Metabolic Panel: Recent Labs  Lab 03/19/19 1108 03/20/19 0535 03/21/19 0459 03/22/19 0351 03/23/19 0503 03/24/19 0646  NA 136  137 138 136 137 136  K 3.8 3.7 3.2* 3.5 3.6 4.5  CL 102 106 109 106 107 103  CO2 24 21* 24 22 23 27   GLUCOSE 339* 195* 115* 167* 142* 163*  BUN 15 16 13 14 13 16   CREATININE 0.79 0.69 0.67 0.64 0.78 0.81  CALCIUM 9.0 8.5* 8.3* 8.6* 8.6* 8.9  MG 1.8  --   --   --  1.7 2.0  PHOS  --   --   --   --   --  4.3    Liver Function Tests: Recent Labs  Lab 03/19/19 1108 03/24/19 0646  AST 22  --   ALT 21  --   ALKPHOS 103  --   BILITOT 1.0  --   PROT 7.5  --   ALBUMIN 4.0 3.4*   Recent Labs  Lab 03/19/19 1108  LIPASE 40   No results for input(s): AMMONIA in the last 168 hours.  CBC: Recent Labs  Lab 03/19/19 1108 03/20/19 0535 03/21/19 0459 03/22/19  5361 03/23/19 0503  WBC 11.8* 13.9* 10.0 10.6* 7.9  NEUTROABS 9.1*  --   --   --   --   HGB 14.6 13.4 13.4 13.2 13.1  HCT 42.6 38.1* 39.8 38.3* 38.1*  MCV 96.4 95.7 96.4 95.8 96.2  PLT 214 224 222 218 206    Cardiac Enzymes: No results for input(s): CKTOTAL, CKMB, CKMBINDEX, TROPONINI in the last 168 hours.  Lipid Panel: Recent Labs  Lab 03/23/19 0503  CHOL 91  TRIG 75  HDL 30*  CHOLHDL 3.0  VLDL 15  LDLCALC 46    CBG: Recent Labs  Lab 03/23/19 1150 03/23/19 1644 03/23/19 2126 03/24/19 0320 03/24/19 0729  GLUCAP 158* 181* 202* 139* 154*    Microbiology: Results for orders placed or performed during the hospital encounter of 03/19/19  SARS Coronavirus 2 (CEPHEID - Performed in [place] hospital lab), Hosp Order     Status: None   Collection Time: 03/19/19 11:09 AM   Specimen: Nasopharyngeal Swab  Result Value Ref Range Status   SARS Coronavirus 2 NEGATIVE NEGATIVE Final    Comment: (NOTE) If result is NEGATIVE SARS-CoV-2 target nucleic acids are NOT DETECTED. The SARS-CoV-2 RNA is generally detectable in upper and lower  respiratory specimens during the acute phase of infection. The lowest  concentration of SARS-CoV-2 viral copies this assay can detect is 250  copies / mL. A negative result  does not preclude SARS-CoV-2 infection  and should not be used as the sole basis for treatment or other  patient management decisions.  A negative result may occur with  improper specimen collection / handling, submission of specimen other  than nasopharyngeal swab, presence of viral mutation(s) within the  areas targeted by this assay, and inadequate number of viral copies  (<250 copies / mL). A negative result must be combined with clinical  observations, patient history, and epidemiological information. If result is POSITIVE SARS-CoV-2 target nucleic acids are DETECTED. The SARS-CoV-2 RNA is generally detectable in upper and lower  respiratory specimens dur ing the acute phase of infection.  Positive  results are indicative of active infection with SARS-CoV-2.  Clinical  correlation with patient history and other diagnostic information is  necessary to determine patient infection status.  Positive results do  not rule out bacterial infection or co-infection with other viruses. If result is PRESUMPTIVE POSTIVE SARS-CoV-2 nucleic acids MAY BE PRESENT.   A presumptive positive result was obtained on the submitted specimen  and confirmed on repeat testing.  While 2019 novel coronavirus  (SARS-CoV-2) nucleic acids may be present in the submitted sample  additional confirmatory testing may be necessary for epidemiological  and / or clinical management purposes  to differentiate between  SARS-CoV-2 and other Sarbecovirus currently known to infect humans.  If clinically indicated additional testing with an alternate test  methodology [phone]) is advised. The SARS-CoV-2 RNA is generally  detectable in upper and lower respiratory sp ecimens during the acute  phase of infection. The expected result is Negative. Fact Sheet for Patients:  StrictlyIdeas.no Fact Sheet for Healthcare Providers: BankingDealers.co.za This test is not yet approved or  cleared by the [place] FDA and has been authorized for detection and/or diagnosis of SARS-CoV-2 by FDA under an Emergency Use Authorization (EUA).  This EUA will remain in effect (meaning this test can be used) for the duration of the COVID-19 declaration under Section 564(b)(1) of the Act, 21 U.S.C. section 360bbb-3(b)(1), unless the authorization is terminated or revoked sooner. Performed at [facility], [address] [place], [place] [address]   MRSA PCR Screening     Status: None   Collection Time: 03/23/19 12:03 PM   Specimen: Nasopharyngeal  Result Value Ref Range Status   MRSA by PCR NEGATIVE NEGATIVE Final    Comment:        The GeneXpert MRSA Assay (FDA approved for NASAL specimens only), is one component of a comprehensive MRSA colonization surveillance program. It is not intended to diagnose MRSA infection nor to guide or monitor treatment for MRSA infections. Performed at [facility], [address] [place], [place] [address]     Coagulation Studies: No results for input(s): LABPROT, INR in the last 72 hours.  Imaging: Ct Head Wo Contrast  Result Date: 03/23/2019 CLINICAL DATA:  Follow-up for possible intracranial hemorrhage. EXAM: CT HEAD WITHOUT CONTRAST TECHNIQUE: Contiguous axial images were obtained from the base of the skull through the vertex without intravenous contrast. COMPARISON:  03/22/2019, brain MRI, 03/21/2019 and head CTs, 03/20/2019 and 12/20/2016. FINDINGS: Brain: Hypoattenuation with minimal areas of punctate hyperattenuation in the inferior, midline cerebellum centered on the vermis, is similar to the CT from 03/20/2019. Is a small focus of hypoattenuation along the peripheral mid right cerebellar hemisphere, which appears new. Partial effacement the fourth ventricle is again noted similar to the prior CT. Remaining ventricles are in size and configuration. Mild areas of white matter hypoattenuation are noted in the  cerebral hemispheres bilaterally consistent chronic microvascular ischemic change. No other evidence of recent infarction. Vascular: No hyperdense vessel or unexpected calcification. Skull: Normal. Negative for fracture or focal lesion. Sinuses/Orbits: No acute finding. Other: None. IMPRESSION: 1. Small punctate foci hyperattenuation lie within the area of hypoattenuation along the midline cerebellum, consistent with subacute infarction and small foci of petechial hemorrhage. This is unchanged from the previous CT scan. 2. Small area of hypoattenuation has developed in the mid right peripheral cerebellum, consistent a small focus of infarct extension. 3. No other change from prior studies. No other evidence intracranial hemorrhage. No hydrocephalus. Electronically Signed   By: Lajean Manes M.D.   On: 03/23/2019 08:55   Mr Brain Wo Contrast  Result Date: 03/24/2019 CLINICAL DATA:  Nausea and vomiting.  Follow-up cerebellar stroke. EXAM: MRI HEAD WITHOUT CONTRAST TECHNIQUE: Multiplanar, multiecho pulse sequences of the brain and surrounding structures were obtained without intravenous contrast. COMPARISON:  Head CT yesterday.  MRI 03/21/2019 FINDINGS: Brain: Acute infarction in the cerebellar vermis and inferior medial aspects of both cerebellar hemispheres shows the same distribution, with evidence of slightly more petechial hemorrhage. There is a new peripheral wedge-shaped region of acute infarction in the right posterior midportion of the cerebellum. I think the findings remain consistent with Heidelberg classification 1b/HI 2. Elsewhere, moderate chronic small-vessel ischemic changes of the cerebral hemispheric deep white matter appears similar. No hydrocephalus. Fourth ventricle is narrowed but not obstructed. No extra-axial collection. Vascular: Absent flow in the right vertebral artery as seen previously. Skull and upper cervical spine: Negative Sinuses/Orbits: Clear/normal Other: None IMPRESSION:  Previously seen region of acute infarction affecting the cerebellar vermis and medial aspect of both inferior cerebellar hemispheres appears the same in distribution, with slightly more petechial bleeding. No confluent hematoma suspected. Hemorrhage remains categorized as Heidelberg classification 1b/HI 2. New peripheral wedge-shaped focus of infarction in the peripheral right midportion cerebellum. Fourth ventricle is narrowed but not apparently obstructed. Ventricular size is stable. Electronically Signed   By: Nelson Chimes M.D.   On: 03/24/2019 07:01  Medications:  I have reviewed the patient's current medications. Scheduled: . atorvastatin  40 mg Oral QHS  . Chlorhexidine Gluconate Cloth  6 each Topical Q0600  . docusate sodium  100 mg Oral Daily  . gabapentin  300 mg Oral QHS  . guaiFENesin  600 mg Oral BID  . insulin aspart  0-5 Units Subcutaneous QHS  . insulin aspart  0-9 Units Subcutaneous TID WC  . insulin glargine  20 Units Subcutaneous BID  . latanoprost  1 drop Both Eyes QHS  . lisinopril  40 mg Oral Daily  . magnesium oxide  400 mg Oral Daily  . tamsulosin  0.4 mg Oral QHS  . tiotropium  18 mcg Inhalation Once per day on Mon Wed Fri    Assessment/Plan: Patient with cerebellar infarct and surrounding edema.  Unclear onset, possibly 7/13.  Repeat head CT shows no evidence of hydrocephalus.  Hypodensity from infarct remains with some infarct extension and small punctate focus of hemorrhage.   - s/p Repeat MRI; - Pt no signs of hydrocephalus and smaller but open 4th ventricle.  - CTH done yesterday without hemorrhage but minor petechial hemorrhage present from prior -  Peak swelling is 72-96 hrs which is now or past it - Will start ASA 81mg  today - transfer to floor tomorrow and at that time can decrease frequency of neuro checks - pt/ot   LOS: 4 days

## 2019-03-24 NOTE — Progress Notes (Signed)
Physical Therapy Treatment Patient Details Name: Mathew Baker MRN: [id number] DOB: 1940/10/23 Today's Date: 03/24/2019    History of Present Illness pt is 78 y.o. male with a PMH notable for Type 2 DM, COPD, HTN, and Hyperlipidemia who presented to [facility] ED on 03/19/19 due to a 3 week history of intermittent dizziness, near syncopal episodes, and a one day history of nausea/vomiting.  in ED bradycardic with HR in the 30's. His SARS-CoV-2 PCR was negative. He was admitted to [facility] unit for further workup and treatment of symptomatic Bradycardia and near syncope with Cardiology consultation.  CT Head on 7/15 showed midline cerebellar hypodensity consistent with edema, therefore Neurology was consulted.  MRI of the brain showed bilateral inferior cerebellar infarcts with some 4th ventricular mass effect but no hydrocephalus, transferred to CCU 7/15 for close monitoring, repeat imaging indicated no evidence of hydrocephalus, Hypodensity from infarct remains with some infarct extension and small punctate focus of hemorrhage    PT Comments    Co-tx overlap with O2 for improved outcomes.  11:34-11:45 PT only 11:45-11:52 overlap. 11:52 - 12:07 OT only  Pt in recliner, had recently walked ICU with nursing and fatigued but willing to try more therapy.  Stood with min guard and mod verbal cues for safety.  Participated in exercises as described below.  Required walker and min assist for safety/balance.  OT in and assisted with gait.  Initially started with RW and min a x 1.  Walker was removed for trial of gait without AD as his baseline.  Pt required min a +2 with very unsteady ataxic gait without walker and significant fall risk without skilled intervention.  RW was given back to pt after 43' and he continued back to his room.  He has varied speed and drifts right with gait.  He is aware of balance deficits and stated he felt more confident with a RW but admits he would be fearful to walk if we were not with  him.  He stated he would feel comfortable with his wife walking with him.  He demonstrated overall decreased safety with transfers with poor hand placements and not reaching back for the chair.  Practiced walking around bed with walker and min assist and pt flops down on bed not reaching back with poor control.  Education provided and he voiced understanding but only minimal correction.  Discussed at length regarding discharge options including benefits of CIR.  Pt seemed resistant to transition of care and stated he wanted to return home.  He felt he would do his own therapy along his porch railing.  He continued to stated "I want to go home." Pt is far from his baseline and at an increased risk for falls if he returns home even with assist from wife for mobility.   OT remained in room to finish session.   Follow Up Recommendations   CIR     Equipment Recommendations   RW    Recommendations for Other Services       Precautions / Restrictions Precautions Precautions: Fall Restrictions Weight Bearing Restrictions: No    Mobility  Bed Mobility               General bed mobility comments: deferred up recliner  Transfers Overall transfer level: Needs assistance Equipment used: None;Rolling walker (2 wheeled) Transfers: Sit to/from Stand Sit to Stand: Min guard         General transfer comment: mod verbal cues for hand placements  Ambulation/Gait Ambulation/Gait assistance: Min assist;Min  guard Gait Distance (Feet): 150 Feet Assistive device: Rolling walker (2 wheeled) Gait Pattern/deviations: Step-through pattern;Drifts right/left;Staggering right;Staggering left Gait velocity: decreased   General Gait Details: remains unsteady but overall improved tolerance and distance - see note   Stairs             Wheelchair Mobility    Modified Rankin (Stroke Patients Only)       Balance Overall balance assessment: Needs assistance Sitting-balance support: Feet  supported Sitting balance-Leahy Scale: Good     Standing balance support: Bilateral upper extremity supported Standing balance-Leahy Scale: Poor                              Cognition Arousal/Alertness: Awake/alert Behavior During Therapy: WFL for tasks assessed/performed Overall Cognitive Status: Within Functional Limits for tasks assessed                                        Exercises Other Exercises Other Exercises: standing with walker and min a x 1.  heel raises, SLR and marches. Other Exercises: funtional gait in room with emphasis on hand placements and safety.    General Comments        Pertinent Vitals/Pain Pain Assessment: No/denies pain    Home Living                      Prior Function            PT Goals (current goals can now be found in the care plan section)      Frequency           PT Plan      Co-evaluation PT/OT/SLP Co-Evaluation/Treatment: Yes Reason for Co-Treatment: For patient/therapist safety;To address functional/ADL transfers PT goals addressed during session: Mobility/safety with mobility OT goals addressed during session: ADL's and self-care      AM-PAC PT "6 Clicks" Mobility   Outcome Measure                   End of Session Equipment Utilized During Treatment: Gait belt             Time: 2947-6546 PT Time Calculation (min) (ACUTE ONLY): 19 min  Charges:  $Gait Training: 8-22 mins                     Chesley Noon, PTA 03/24/19, 1:45 PM

## 2019-03-24 NOTE — Progress Notes (Signed)
Remains alert and oriented. No nero deficits noted. No complaints of dizziness, nausea or vomiting. No aphasia noted.

## 2019-03-24 NOTE — Progress Notes (Signed)
[place] [facility] [place] NAME: Mathew Baker    MR#:  [id number]  DATE OF BIRTH:  July 07, 1941  SUBJECTIVE:  CHIEF COMPLAINT: Patient is transferred to intensive care unit for cerebellar infarct.  Patient denies any dizziness, blurred vision today.  MRI of the brain showed right cerebellar infarct with petechial hemorrhage.  Patient is alert, awake, oriented, no new neurological symptoms overnight. REVIEW OF SYSTEMS:  CONSTITUTIONAL: No fever, fatigue or weakness.  EYES: No blurred or double vision.  EARS, NOSE, AND THROAT: No tinnitus or ear pain.  RESPIRATORY: No cough, shortness of breath, wheezing or hemoptysis.  CARDIOVASCULAR: No chest pain, orthopnea, edema.  GASTROINTESTINAL: No nausea, vomiting, diarrhea or abdominal pain.  GENITOURINARY: No dysuria, hematuria.  ENDOCRINE: No polyuria, nocturia,  HEMATOLOGY: No anemia, easy bruising or bleeding SKIN: No rash or lesion. MUSCULOSKELETAL: No joint pain or arthritis.   NEUROLOGIC: No tingling, numbness, weakness.  PSYCHIATRY: No anxiety or depression.   DRUG ALLERGIES:  No Known Allergies  VITALS:  Blood pressure (!) 153/85, pulse 74, temperature 97.6 F (36.4 C), temperature source Axillary, resp. rate 15, height 6\' 1"  (1.854 m), weight 100.1 kg, SpO2 100 %.  PHYSICAL EXAMINATION:  GENERAL:  78 y.o.-year-old patient lying in the bed with no acute distress.  EYES: Pupils equal, round, reactive to light and accommodation. No scleral icterus. Extraocular muscles intact.  HEENT: Head atraumatic, normocephalic. Oropharynx and nasopharynx clear.  NECK:  Supple, no jugular venous distention. No thyroid enlargement, no tenderness.  LUNGS: Normal breath sounds bilaterally, no wheezing, rales,rhonchi or crepitation. No use of accessory muscles of respiration.  CARDIOVASCULAR: S1, S2 normal. No murmurs, rubs, or gallops.  ABDOMEN: Soft, nontender, nondistended. Bowel sounds present.   EXTREMITIES: No pedal edema, cyanosis, or clubbing.  NEUROLOGIC: Cranial nerves II through XII are intact. Muscle strength 5/5 in all extremities, ptosis not present, alert, awake, oriented.  Smile is symmetrical, hearing is intact.  Gag reflex present, all cranial nerves II through XII intact.  Pinprick and light touch intact throughout bilaterally.  Visual fields are grossly normal.  PSYCHIATRIC: The patient is alert and oriented x 3.  SKIN: No obvious rash, lesion, or ulcer.    LABORATORY PANEL:   CBC Recent Labs  Lab 03/23/19 0503  WBC 7.9  HGB 13.1  HCT 38.1*  PLT 206   ------------------------------------------------------------------------------------------------------------------  Chemistries  Recent Labs  Lab 03/19/19 1108  03/24/19 0646  NA 136   < > 136  K 3.8   < > 4.5  CL 102   < > 103  CO2 24   < > 27  GLUCOSE 339*   < > 163*  BUN 15   < > 16  CREATININE 0.79   < > 0.81  CALCIUM 9.0   < > 8.9  MG 1.8   < > 2.0  AST 22  --   --   ALT 21  --   --   ALKPHOS 103  --   --   BILITOT 1.0  --   --    < > = values in this interval not displayed.   ------------------------------------------------------------------------------------------------------------------  Cardiac Enzymes No results for input(s): TROPONINI in the last 168 hours. ------------------------------------------------------------------------------------------------------------------  RADIOLOGY:  Ct Head Wo Contrast  Result Date: 03/23/2019 CLINICAL DATA:  Follow-up for possible intracranial hemorrhage. EXAM: CT HEAD WITHOUT CONTRAST TECHNIQUE: Contiguous axial images were obtained from the base of the skull through the vertex without intravenous contrast. COMPARISON:  03/22/2019,  brain MRI, 03/21/2019 and head CTs, 03/20/2019 and 12/20/2016. FINDINGS: Brain: Hypoattenuation with minimal areas of punctate hyperattenuation in the inferior, midline cerebellum centered on the vermis, is similar to the  CT from 03/20/2019. Is a small focus of hypoattenuation along the peripheral mid right cerebellar hemisphere, which appears new. Partial effacement the fourth ventricle is again noted similar to the prior CT. Remaining ventricles are in size and configuration. Mild areas of white matter hypoattenuation are noted in the cerebral hemispheres bilaterally consistent chronic microvascular ischemic change. No other evidence of recent infarction. Vascular: No hyperdense vessel or unexpected calcification. Skull: Normal. Negative for fracture or focal lesion. Sinuses/Orbits: No acute finding. Other: None. IMPRESSION: 1. Small punctate foci hyperattenuation lie within the area of hypoattenuation along the midline cerebellum, consistent with subacute infarction and small foci of petechial hemorrhage. This is unchanged from the previous CT scan. 2. Small area of hypoattenuation has developed in the mid right peripheral cerebellum, consistent a small focus of infarct extension. 3. No other change from prior studies. No other evidence intracranial hemorrhage. No hydrocephalus. Electronically Signed   By: Lajean Manes M.D.   On: 03/23/2019 08:55   Mr Brain Wo Contrast  Result Date: 03/24/2019 CLINICAL DATA:  Nausea and vomiting.  Follow-up cerebellar stroke. EXAM: MRI HEAD WITHOUT CONTRAST TECHNIQUE: Multiplanar, multiecho pulse sequences of the brain and surrounding structures were obtained without intravenous contrast. COMPARISON:  Head CT yesterday.  MRI 03/21/2019 FINDINGS: Brain: Acute infarction in the cerebellar vermis and inferior medial aspects of both cerebellar hemispheres shows the same distribution, with evidence of slightly more petechial hemorrhage. There is a new peripheral wedge-shaped region of acute infarction in the right posterior midportion of the cerebellum. I think the findings remain consistent with Heidelberg classification 1b/HI 2. Elsewhere, moderate chronic small-vessel ischemic changes of the  cerebral hemispheric deep white matter appears similar. No hydrocephalus. Fourth ventricle is narrowed but not obstructed. No extra-axial collection. Vascular: Absent flow in the right vertebral artery as seen previously. Skull and upper cervical spine: Negative Sinuses/Orbits: Clear/normal Other: None IMPRESSION: Previously seen region of acute infarction affecting the cerebellar vermis and medial aspect of both inferior cerebellar hemispheres appears the same in distribution, with slightly more petechial bleeding. No confluent hematoma suspected. Hemorrhage remains categorized as Heidelberg classification 1b/HI 2. New peripheral wedge-shaped focus of infarction in the peripheral right midportion cerebellum. Fourth ventricle is narrowed but not apparently obstructed. Ventricular size is stable. Electronically Signed   By: Nelson Chimes M.D.   On: 03/24/2019 07:01    EKG:   Orders placed or performed during the hospital encounter of 12/20/16  . ED EKG  . ED EKG  . EKG 12-Lead  . EKG 12-Lead  . ED EKG  . ED EKG  . EKG 12-Lead  . EKG 12-Lead  . EKG  . EKG    ASSESSMENT AND PLAN:   Patient 78 year old presenting with dizziness nausea vomiting  #Acute cerebellar infarct with surrounding edema, patient had  MRI of the brain showed no signs of hydrocephalus but  smaller fourth ventricle.  Patient is alert, awake, oriented with no dizziness, blurred vision, neurological exam remained stable.  Seen by neurology today, Dr. Irish Elders recommended to start aspirin 81 mg daily, transfer to floor tomorrow and at that time can decrease frequency of neurochecks.  Patient is eager to go home.  If patient develops worsening headache or worsening of imaging findings are altered mental status  need to be transferred to tertiary care center R PT/OT/speech therapy  assessment-CIR   #.   Dizziness probably Symptomatic bradycardia patient does not appear to be on any heart  rate lowering  medications Asymptomatic Cardiology is following, heart rate is stable at around 50s We will monitor on telemetry Echocardiogram of the heart-60 to 65% ejection fraction, normal systolic function   -stress test in May 2018 has revealed normal LV function with EF estimated at 57% no evidence of stress-induced ischemia -Carotid Dopplers with no significant stenosis  2.  Diabetes type 2 we will continue Lantus hold metformin due to nausea, I will also hold Glucotrol XL, continue sliding scale insulin with coverage. Dietitian-provided diet education   3.  Hyperlipidemia continue Lipitor  4.  Glaucoma continue eyedrops  5.  Hypertension continue lisinopril monitor blood pressure  6.  Acute gastritis we will check GI panel PPI.  H. pylori negative  PT consult reviewed, given the patient wants to go home patient is high risk for falls and far from his baseline and getting risk of falls if he returns home, physical therapy is recommending CIR. All the records are reviewed and case discussed with Care Management/Social Workerr. Management plans discussed with the patient, try to reach patient's wife at [phone] could not reach her and voicemail box set up  CODE STATUS: fc  TOTAL TIME TAKING CARE OF THIS PATIENT: 38 minutes.   POSSIBLE D/C IN  DAYS, DEPENDING ON CLINICAL CONDITION. More than 50% time spent in counseling, coordination of care Note: This dictation was prepared with Dragon dictation along with smaller phrase technology. Any transcriptional errors that result from this process are unintentional.   Epifanio Lesches M.D on 03/24/2019 at 2:57 PM  Between 7am to 6pm - Pager - [phone] After 6pm go to [email or web address] - password [email or web address] [facility]  Office  [phone]  CC: Primary care physician; Patient, No Pcp Per

## 2019-03-24 NOTE — Progress Notes (Signed)
Occupational Therapy Treatment Patient Details Name: Abdulkadir Emmanuel MRN: [id number] DOB: 1940/10/27 Today's Date: 03/24/2019    History of present illness pt is 78 y.o. male with a PMH notable for Type 2 DM, COPD, HTN, and Hyperlipidemia who presented to [facility] ED on 03/19/19 due to a 3 week history of intermittent dizziness, near syncopal episodes, and a one day history of nausea/vomiting.  in ED bradycardic with HR in the 30's. His SARS-CoV-2 PCR was negative. He was admitted to [facility] unit for further workup and treatment of symptomatic Bradycardia and near syncope with Cardiology consultation.  CT Head on 7/15 showed midline cerebellar hypodensity consistent with edema, therefore Neurology was consulted.  MRI of the brain showed bilateral inferior cerebellar infarcts with some 4th ventricular mass effect but no hydrocephalus, transferred to CCU 7/15 for close monitoring, repeat imaging indicated no evidence of hydrocephalus, Hypodensity from infarct remains with some infarct extension and small punctate focus of hemorrhage   OT comments  Patient seen for partial co treatment, overlapping for short part of session for mobility portion of treatment.  Patient improved this date with mobility, able to ambulate with rolling walker and assist of one for balance, other therapist to manage lines and tubes.  Patient's balance remains poor and will require the use of AD for safety.  Patient required repeated cues for safety with transfers and hand placement with walker.  Tends to sit quickly without reaching back despite several trials.  He is able to transfer sit to stand with min guard, negotiate clothing over hips with min guard to min assist and cues for safety.  RUE remains limited with motion to around 70 degrees of shoulder flexion this date, full motion at elbow, wrist and hand.  Discussed discharge plan and patient is wanting to go home versus rehab.  He is aware therapists continue to recommend inpatient  rehab due to safety with balance.  Continue to work towards goals in [place] to increase safety and independence in necessary daily tasks.    Follow Up Recommendations  CIR    Equipment Recommendations       Recommendations for Other Services Rehab consult    Precautions / Restrictions Precautions Precautions: Fall Restrictions Weight Bearing Restrictions: No       Mobility Bed Mobility               General bed mobility comments: deferred up recliner  Transfers Overall transfer level: Needs assistance Equipment used: None;Rolling walker (2 wheeled) Transfers: Sit to/from Stand Sit to Stand: Min guard         General transfer comment: mod verbal cues for hand placements    Balance Overall balance assessment: Needs assistance Sitting-balance support: Feet supported Sitting balance-Leahy Scale: Good     Standing balance support: Bilateral upper extremity supported Standing balance-Leahy Scale: Poor                             ADL either performed or assessed with clinical judgement   ADL                         Lower Body Dressing Details (indicate cue type and reason): Patient able to don and doff socks from seated position without assistance this date, good balance seated in chair.  Standing to simulate clothing negotiation over hips, Min guard to min assist and use of walker, cues to use one hand at a time and  other hand holding walker.  Cues for patient to reach back to chair upon sitting for safety. Toilet Transfer: Psychiatric nurse Details (indicate cue type and reason): Min assist for balance, especially with transitional movements Toileting- Clothing Manipulation and Hygiene: Minimal assistance         General ADL Comments: Patient able to ambulate with RW this date with PT while OT managed equipment, patient improved from last date, decreased assist for transfers, continues to demo poor balance and will require  use of a walker at home for safety.  Patient required moderate to max cues for safety with transitional movements from sit to stand, stand to sit and with turning behaviors with walker.     Vision       Perception     Praxis      Cognition Arousal/Alertness: Awake/alert Behavior During Therapy: WFL for tasks assessed/performed Overall Cognitive Status: Within Functional Limits for tasks assessed                                          Exercises Other Exercises Other Exercises: standing with walker and min a x 1.  heel raises, SLR and marches. Other Exercises: funtional gait in room with emphasis on hand placements and safety.   Shoulder Instructions       General Comments      Pertinent Vitals/ Pain       Pain Assessment: No/denies pain  Home Living                                          Prior Functioning/Environment              Frequency  Min 3X/week        Progress Toward Goals  OT Goals(current goals can now be found in the care plan section)  Progress towards OT goals: Progressing toward goals  Acute Rehab OT Goals Patient Stated Goal: to return to PLOF OT Goal Formulation: With patient Time For Goal Achievement: 04/06/19 Potential to Achieve Goals: Good  Plan Discharge plan remains appropriate    Co-evaluation      Reason for Co-Treatment: Complexity of the patient's impairments (multi-system involvement);For patient/therapist safety;To address functional/ADL transfers PT goals addressed during session: Mobility/safety with mobility OT goals addressed during session: ADL's and self-care;Strengthening/ROM      AM-PAC OT "6 Clicks" Daily Activity     Outcome Measure   Help from another person eating meals?: None Help from another person taking care of personal grooming?: None Help from another person toileting, which includes using toliet, bedpan, or urinal?: A Little Help from another person bathing  (including washing, rinsing, drying)?: A Little Help from another person to put on and taking off regular upper body clothing?: A Little Help from another person to put on and taking off regular lower body clothing?: A Little 6 Click Score: 20    End of Session Equipment Utilized During Treatment: Gait belt;Rolling walker  OT Visit Diagnosis: Other abnormalities of gait and mobility (R26.89);Hemiplegia and hemiparesis Hemiplegia - Right/Left: Right Hemiplegia - dominant/non-dominant: Non-Dominant Hemiplegia - caused by: Cerebral infarction   Activity Tolerance Patient tolerated treatment well   Patient Left in chair;with call bell/phone within reach   Nurse Communication  Time: 0681-6619 OT Time Calculation (min): 22 min  Charges: OT General Charges $OT Visit: 1 Visit OT Treatments $Self Care/Home Management : 8-22 mins  Jeno Calleros T Calhoun Reichardt, OTR/L, CLT    Bertil Brickey 03/24/2019, 1:49 PM

## 2019-03-24 NOTE — Consult Note (Signed)
PHARMACY CONSULT NOTE - FOLLOW UP  Pharmacy Consult for Electrolyte Monitoring and Replacement   Recent Labs: Potassium (mmol/L)  Date Value  03/24/2019 4.5   Magnesium (mg/dL)  Date Value  03/24/2019 2.0   Calcium (mg/dL)  Date Value  03/24/2019 8.9   Albumin (g/dL)  Date Value  03/24/2019 3.4 (L)   Phosphorus (mg/dL)  Date Value  03/24/2019 4.3   Sodium (mmol/L)  Date Value  03/24/2019 136  07/15/2014 136 (A)    Assessment: 78 year old presenting with dizziness nausea vomiting. She was brought to CCU with an acute cerebellar infarct with mild to moderate swelling but without hydrocephalus  Goal of Therapy:  Potassium > 4 mmol/L Magnesium > 2 mg/dL Phosphorous > 3 mg/dL Calcium > 7.5 mg/dL  Plan:   No replacement warranted at this time.   Follow-up electrolytes in am and replace as needed  Simpson,Michael L , RPh 03/24/2019 8:01 AM

## 2019-03-25 LAB — BASIC METABOLIC PANEL
Anion gap: 8 (ref 5–15)
BUN: 19 mg/dL (ref 8–23)
CO2: 26 mmol/L (ref 22–32)
Calcium: 8.9 mg/dL (ref 8.9–10.3)
Chloride: 102 mmol/L (ref 98–111)
Creatinine, Ser: 0.84 mg/dL (ref 0.61–1.24)
GFR calc Af Amer: >60 mL/min (ref >60)
GFR calc non Af Amer: >60 mL/min (ref >60)
Glucose, Bld: 185 mg/dL — ABNORMAL HIGH (ref 70–99)
Potassium: 4.2 mmol/L (ref 3.5–5.1)
Sodium: 136 mmol/L (ref 135–145)

## 2019-03-25 LAB — MAGNESIUM: Magnesium: 2 mg/dL (ref 1.7–2.4)

## 2019-03-25 LAB — GLUCOSE, CAPILLARY
Glucose-Capillary: 194 mg/dL — ABNORMAL HIGH (ref 70–99)
Glucose-Capillary: 194 mg/dL — ABNORMAL HIGH (ref 70–99)
Glucose-Capillary: 239 mg/dL — ABNORMAL HIGH (ref 70–99)
Glucose-Capillary: 191 mg/dL — ABNORMAL HIGH (ref 70–99)
Glucose-Capillary: 226 mg/dL — ABNORMAL HIGH (ref 70–99)

## 2019-03-25 LAB — CBC
HCT: 38.6 % — ABNORMAL LOW (ref 39.0–52.0)
Hemoglobin: 13.2 g/dL (ref 13.0–17.0)
MCH: 32.8 pg (ref 26.0–34.0)
MCHC: 34.2 g/dL (ref 30.0–36.0)
MCV: 95.8 fL (ref 80.0–100.0)
Platelets: 224 10*3/uL (ref 150–400)
RBC: 4.03 MIL/uL — ABNORMAL LOW (ref 4.22–5.81)
RDW: 12.6 % (ref 11.5–15.5)
WBC: 10.1 10*3/uL (ref 4.0–10.5)
nRBC: 0 % (ref 0.0–0.2)

## 2019-03-25 LAB — PHOSPHORUS: Phosphorus: 4.5 mg/dL (ref 2.5–4.6)

## 2019-03-25 MED ORDER — DICLOFENAC SODIUM 1 % TD GEL: 1.0000 "application " | Freq: Every day | TRANSDERMAL | 0 refills | Status: DC | PRN

## 2019-03-25 MED ORDER — LISINOPRIL 40 MG PO TABS: 40.0000 mg | ORAL_TABLET | Freq: Every day | ORAL | 0 refills | Status: DC

## 2019-03-25 MED ORDER — BISACODYL 10 MG RE SUPP: 10.0000 mg | Freq: Every day | RECTAL | Status: DC | PRN

## 2019-03-25 MED ORDER — POLYETHYLENE GLYCOL 3350 17 G PO PACK
17.0000 g | PACK | Freq: Every day | ORAL | Status: DC
  Administered 2019-03-25 – 2019-03-27 (×3): 17 g via ORAL
  Filled 2019-03-25 (×3): qty 1

## 2019-03-25 MED ORDER — MAGNESIUM HYDROXIDE 400 MG/5ML PO SUSP
30.0000 mL | Freq: Once | ORAL | Status: AC
  Administered 2019-03-25: 30 mL via ORAL
  Filled 2019-03-25: qty 30

## 2019-03-25 MED ORDER — INSULIN ASPART 100 UNIT/ML ~~LOC~~ SOLN: 0.0000 [IU] | Freq: Three times a day (TID) | SUBCUTANEOUS | 11 refills | Status: DC

## 2019-03-25 MED ORDER — POLYETHYLENE GLYCOL 3350 17 G PO PACK: 17.0000 g | PACK | Freq: Every day | ORAL | Status: DC

## 2019-03-25 MED ORDER — ASPIRIN 325 MG PO TABS: 325.0000 mg | ORAL_TABLET | Freq: Every day | ORAL | 0 refills | Status: DC

## 2019-03-25 MED ORDER — INSULIN GLARGINE 100 UNIT/ML ~~LOC~~ SOLN: 20.0000 [IU] | Freq: Two times a day (BID) | SUBCUTANEOUS | 11 refills | Status: DC

## 2019-03-25 NOTE — Progress Notes (Signed)
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  He denies any dizziness, sitting in the chair, wants to go home.  No double vision.  Patient was seen with physical therapy yesterday, recommended inpatient rehab, patient told me today that he has extra help in addition to his wife at home.  And wants to go home.  physical therapy session, neurology follow-up is pending. REVIEW OF SYSTEMS:  CONSTITUTIONAL: No fever, fatigue or weakness.  EYES: No blurred or double vision.  EARS, NOSE, AND THROAT: No tinnitus or ear pain.  RESPIRATORY: No cough, shortness of breath, wheezing or hemoptysis.  CARDIOVASCULAR: No chest pain, orthopnea, edema.  GASTROINTESTINAL: No nausea, vomiting, diarrhea or abdominal pain.  GENITOURINARY: No dysuria, hematuria.  ENDOCRINE: No polyuria, nocturia,  HEMATOLOGY: No anemia, easy bruising or bleeding SKIN: No rash or lesion. MUSCULOSKELETAL: No joint pain or arthritis.   NEUROLOGIC: No tingling, numbness, weakness.  PSYCHIATRY: No anxiety or depression.   DRUG ALLERGIES:  No Known Allergies  VITALS:  Blood pressure 130/65, pulse 82, temperature 98.2 F (36.8 C), temperature source Oral, resp. rate 15, height 6\' 1"  (1.854 m), weight 100.1 kg, SpO2 96 %.  PHYSICAL EXAMINATION:  GENERAL:  78 y.o.-year-old patient lying in the bed with no acute distress.  EYES: Pupils equal, round, reactive to light and accommodation. No scleral icterus. Extraocular muscles intact.  HEENT: Head atraumatic, normocephalic. Oropharynx and nasopharynx clear.  NECK:  Supple, no jugular venous distention. No thyroid enlargement, no tenderness.  LUNGS: Normal breath sounds bilaterally, no wheezing, rales,rhonchi or crepitation. No use of accessory muscles of respiration.  CARDIOVASCULAR: S1, S2  normal. No murmurs, rubs, or gallops.  ABDOMEN: Soft, nontender, nondistended. Bowel sounds present.  EXTREMITIES: No pedal edema, cyanosis, or clubbing.  NEUROLOGIC: Cranial nerves II through XII are intact. Muscle strength 5/5 in all extremities, ptosis not present, alert, awake, oriented.  Smile is symmetrical, hearing is intact.  Gag reflex present, all cranial nerves II through XII intact.  Pinprick and light touch intact throughout bilaterally.  Visual fields are grossly normal.  PSYCHIATRIC: The patient is alert and oriented x 3.  SKIN: No obvious rash, lesion, or ulcer.    LABORATORY PANEL:   CBC Recent Labs  Lab 03/25/19 0457  WBC 10.1  HGB 13.2  HCT 38.6*  PLT 224   ------------------------------------------------------------------------------------------------------------------  Chemistries  Recent Labs  Lab 03/19/19 1108  03/25/19 0457  NA 136   < > 136  K 3.8   < > 4.2  CL 102   < > 102  CO2 24   < > 26  GLUCOSE 339*   < > 185*  BUN 15   < > 19  CREATININE 0.79   < > 0.84  CALCIUM 9.0   < > 8.9  MG 1.8   < > 2.0  AST 22  --   --   ALT 21  --   --   ALKPHOS 103  --   --   BILITOT 1.0  --   --    < > = values in this interval not displayed.   ------------------------------------------------------------------------------------------------------------------  Cardiac Enzymes No results for input(s): TROPONINI in the last 168 hours. ------------------------------------------------------------------------------------------------------------------  RADIOLOGY:  Mr Brain Wo Contrast  Result Date: 03/24/2019 CLINICAL DATA:  Nausea and vomiting.  Follow-up  cerebellar stroke. EXAM: MRI HEAD WITHOUT CONTRAST TECHNIQUE: Multiplanar, multiecho pulse sequences of the brain and surrounding structures were obtained without intravenous contrast. COMPARISON:  Head CT yesterday.  MRI 03/21/2019 FINDINGS: Brain: Acute infarction in the cerebellar vermis and inferior medial  aspects of both cerebellar hemispheres shows the same distribution, with evidence of slightly more petechial hemorrhage. There is a new peripheral wedge-shaped region of acute infarction in the right posterior midportion of the cerebellum. I think the findings remain consistent with Heidelberg classification 1b/HI 2. Elsewhere, moderate chronic small-vessel ischemic changes of the cerebral hemispheric deep white matter appears similar. No hydrocephalus. Fourth ventricle is narrowed but not obstructed. No extra-axial collection. Vascular: Absent flow in the right vertebral artery as seen previously. Skull and upper cervical spine: Negative Sinuses/Orbits: Clear/normal Other: None IMPRESSION: Previously seen region of acute infarction affecting the cerebellar vermis and medial aspect of both inferior cerebellar hemispheres appears the same in distribution, with slightly more petechial bleeding. No confluent hematoma suspected. Hemorrhage remains categorized as Heidelberg classification 1b/HI 2. New peripheral wedge-shaped focus of infarction in the peripheral right midportion cerebellum. Fourth ventricle is narrowed but not apparently obstructed. Ventricular size is stable. Electronically Signed   By: Nelson Chimes M.D.   On: 03/24/2019 07:01    EKG:   Orders placed or performed during the hospital encounter of 12/20/16  . ED EKG  . ED EKG  . EKG 12-Lead  . EKG 12-Lead  . ED EKG  . ED EKG  . EKG 12-Lead  . EKG 12-Lead  . EKG  . EKG    ASSESSMENT AND PLAN:   Patient 78 year old presenting with dizziness nausea vomiting  #Acute cerebellar infarct with surrounding edema, patient had  MRI of the brain showed no signs of hydrocephalus but  smaller fourth ventricle.  Patient is alert, awake, oriented with no dizziness, blurred vision, neurological exam remained stable.  Seen by neurology today, Dr. Irish Elders, started on aspirin, patient feels well today without any dizziness, blurred vision.   PT/OT/speech therapy assessment-CIR However patient wants to go home with home health physical therapy.  #.   Dizziness probably Symptomatic bradycardia patient does not appear to be on any heart  rate lowering medications Asymptomatic Cardiology is following, heart rate is stable at around 50s We will monitor on telemetry Echocardiogram of the heart-60 to 65% ejection fraction, normal systolic function   -stress test in May 2018 has revealed normal LV function with EF estimated at 57% no evidence of stress-induced ischemia -Carotid Dopplers with no significant stenosis  2.  Diabetes type 2 we will continue Lantus hold metformin due to nausea, I will also hold Glucotrol XL, continue sliding scale insulin with coverage. Dietitian-provided diet education   3.  Hyperlipidemia continue Lipitor  4.  Glaucoma continue eyedrops  5.  Hypertension continue lisinopril monitor blood pressure  6.  Acute gastritis we will check GI panel PPI.  H. pylori negative  PT consult reviewed, given the patient wants to go home patient is high risk for falls and far from his baseline and getting risk of falls if he returns home, physical therapy is recommending CIR. All the records are reviewed and case discussed with Care Management/Social Workerr. Management plans discussed with the patient, try to reach patient's wife at [phone] could not reach her and voicemail box set up  CODE STATUS: fc  TOTAL TIME TAKING CARE OF THIS PATIENT: 38 minutes.   POSSIBLE D/C IN  DAYS, DEPENDING ON CLINICAL CONDITION. More than 50% time  spent in counseling, coordination of care Note: This dictation was prepared with Dragon dictation along with smaller phrase technology. Any transcriptional errors that result from this process are unintentional.   Epifanio Lesches M.D on 03/25/2019 at 1:04 PM  Between 7am to 6pm - Pager - [phone] After 6pm go to [email or web address] - password [email or web address]  Office  [phone]  CC: Primary care physician; Patient, No Pcp Per

## 2019-03-25 NOTE — Progress Notes (Signed)
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  He denies any dizziness, sitting in the chair, wants to go home.  No double vision.  Patient was seen with physical therapy yesterday, recommended inpatient rehab, patient told me today that he has extra help in addition to his wife at home.  And wants to go home.  physical therapy session, neurology follow-up is pending. REVIEW OF SYSTEMS:  CONSTITUTIONAL: No fever, fatigue or weakness.  EYES: No blurred or double vision.  EARS, NOSE, AND THROAT: No tinnitus or ear pain.  RESPIRATORY: No cough, shortness of breath, wheezing or hemoptysis.  CARDIOVASCULAR: No chest pain, orthopnea, edema.  GASTROINTESTINAL: No nausea, vomiting, diarrhea or abdominal pain.  GENITOURINARY: No dysuria, hematuria.  ENDOCRINE: No polyuria, nocturia,  HEMATOLOGY: No anemia, easy bruising or bleeding SKIN: No rash or lesion. MUSCULOSKELETAL: No joint pain or arthritis.   NEUROLOGIC: No tingling, numbness, weakness.  PSYCHIATRY: No anxiety or depression.   DRUG ALLERGIES:  No Known Allergies  VITALS:  Blood pressure 130/65, pulse 82, temperature 98.2 F (36.8 C), temperature source Oral, resp. rate 15, height 6\' 1"  (1.854 m), weight 100.1 kg, SpO2 96 %.  PHYSICAL EXAMINATION:  GENERAL:  78 y.o.-year-old patient lying in the bed with no acute distress.  EYES: Pupils equal, round, reactive to light and accommodation. No scleral icterus. Extraocular muscles intact.  HEENT: Head atraumatic, normocephalic. Oropharynx and nasopharynx clear.  NECK:  Supple, no jugular venous distention. No thyroid enlargement, no tenderness.  LUNGS: Normal breath sounds bilaterally, no wheezing, rales,rhonchi or crepitation. No use of accessory muscles of respiration.  CARDIOVASCULAR: S1, S2  normal. No murmurs, rubs, or gallops.  ABDOMEN: Soft, nontender, nondistended. Bowel sounds present.  EXTREMITIES: No pedal edema, cyanosis, or clubbing.  NEUROLOGIC: Cranial nerves II through XII are intact. Muscle strength 5/5 in all extremities, ptosis not present, alert, awake, oriented.  Smile is symmetrical, hearing is intact.  Gag reflex present, all cranial nerves II through XII intact.  Pinprick and light touch intact throughout bilaterally.  Visual fields are grossly normal.  PSYCHIATRIC: The patient is alert and oriented x 3.  SKIN: No obvious rash, lesion, or ulcer.    LABORATORY PANEL:   CBC Recent Labs  Lab 03/25/19 0457  WBC 10.1  HGB 13.2  HCT 38.6*  PLT 224   ------------------------------------------------------------------------------------------------------------------  Chemistries  Recent Labs  Lab 03/19/19 1108  03/25/19 0457  NA 136   < > 136  K 3.8   < > 4.2  CL 102   < > 102  CO2 24   < > 26  GLUCOSE 339*   < > 185*  BUN 15   < > 19  CREATININE 0.79   < > 0.84  CALCIUM 9.0   < > 8.9  MG 1.8   < > 2.0  AST 22  --   --   ALT 21  --   --   ALKPHOS 103  --   --   BILITOT 1.0  --   --    < > = values in this interval not displayed.   ------------------------------------------------------------------------------------------------------------------  Cardiac Enzymes No results for input(s): TROPONINI in the last 168 hours. ------------------------------------------------------------------------------------------------------------------  RADIOLOGY:  Mr Brain Wo Contrast  Result Date: 03/24/2019 CLINICAL DATA:  Nausea and vomiting.  Follow-up  cerebellar stroke. EXAM: MRI HEAD WITHOUT CONTRAST TECHNIQUE: Multiplanar, multiecho pulse sequences of the brain and surrounding structures were obtained without intravenous contrast. COMPARISON:  Head CT yesterday.  MRI 03/21/2019 FINDINGS: Brain: Acute infarction in the cerebellar vermis and inferior medial  aspects of both cerebellar hemispheres shows the same distribution, with evidence of slightly more petechial hemorrhage. There is a new peripheral wedge-shaped region of acute infarction in the right posterior midportion of the cerebellum. I think the findings remain consistent with Heidelberg classification 1b/HI 2. Elsewhere, moderate chronic small-vessel ischemic changes of the cerebral hemispheric deep white matter appears similar. No hydrocephalus. Fourth ventricle is narrowed but not obstructed. No extra-axial collection. Vascular: Absent flow in the right vertebral artery as seen previously. Skull and upper cervical spine: Negative Sinuses/Orbits: Clear/normal Other: None IMPRESSION: Previously seen region of acute infarction affecting the cerebellar vermis and medial aspect of both inferior cerebellar hemispheres appears the same in distribution, with slightly more petechial bleeding. No confluent hematoma suspected. Hemorrhage remains categorized as Heidelberg classification 1b/HI 2. New peripheral wedge-shaped focus of infarction in the peripheral right midportion cerebellum. Fourth ventricle is narrowed but not apparently obstructed. Ventricular size is stable. Electronically Signed   By: Nelson Chimes M.D.   On: 03/24/2019 07:01    EKG:   Orders placed or performed during the hospital encounter of 12/20/16  . ED EKG  . ED EKG  . EKG 12-Lead  . EKG 12-Lead  . ED EKG  . ED EKG  . EKG 12-Lead  . EKG 12-Lead  . EKG  . EKG    ASSESSMENT AND PLAN:   Patient 78 year old presenting with dizziness nausea vomiting  #Acute cerebellar infarct with surrounding edema, patient had  MRI of the brain showed no signs of hydrocephalus but  smaller fourth ventricle.  Patient is alert, awake, oriented with no dizziness, blurred vision, neurological exam remained stable.  Seen by neurology today, Dr. Irish Elders, started on aspirin, patient feels well today without any dizziness, blurred vision.   PT/OT/speech therapy assessment-CIR However patient wants to go home with home health physical therapy.  #.   Dizziness probably Symptomatic bradycardia patient does not appear to be on any heart  rate lowering medications Asymptomatic Cardiology is following, heart rate is stable at around 50s We will monitor on telemetry Echocardiogram of the heart-60 to 65% ejection fraction, normal systolic function   -stress test in May 2018 has revealed normal LV function with EF estimated at 57% no evidence of stress-induced ischemia -Carotid Dopplers with no significant stenosis  2.  Diabetes type 2 we will continue Lantus,, metformin.  Lantus dose decreased at discharge.  Patient not taking glipizide anymore.  Discharge instructions are in the computer.  Patient follows up with the [facility], advised him to keep the appointment.  Dietitian-provided diet education   3.  Hyperlipidemia continue Lipitor  4.  Glaucoma continue eyedrops  5.  Hypertension continue lisinopril monitor blood pressure  6.  Acute gastritis we will check GI panel PPI.  H. pylori negative Physical therapy recommends inpatient rehab but patient wants to go home in case if he goes home home health orders are in place. PT consult reviewed, given the patient wants to go home patient is high risk for falls and far from his baseline and getting risk of falls if he returns home, physical therapy is recommending CIR. All the records are reviewed and case discussed with Care Management/Social Workerr.  CODE STATUS: fc  TOTAL TIME TAKING CARE OF THIS PATIENT:  38 minutes.   POSSIBLE D/C IN  DAYS, DEPENDING ON CLINICAL CONDITION. More than 50% time spent in counseling, coordination of care Note: This dictation was prepared with Dragon dictation along with smaller phrase technology. Any transcriptional errors that result from this process are unintentional.   Epifanio Lesches M.D on 03/25/2019 at 1:16 PM  Between 7am to 6pm -  Pager - [phone] After 6pm go to [email or web address] - password [email or web address] [facility]  Office  [phone]  CC: Primary care physician; Patient, No Pcp Per

## 2019-03-25 NOTE — Progress Notes (Signed)
PT Cancellation Note  Patient Details Name: Mathew Baker MRN: [id number] DOB: 07/29/1941   Cancelled Treatment:    Reason Eval/Treat Not Completed: Fatigue/lethargy limiting ability to participate   Offered x 2 this am.  Pt reported general fatigue.  Stated he has been up in room with nursing and requests to continue tomorrow with therapy.   Chesley Noon 03/25/2019, 1:42 PM

## 2019-03-25 NOTE — Progress Notes (Addendum)
Good day. Up in room. No neuro s/s noted. Refused to walk with PT today. Up in chair most of the day. May move to 2A. Balance still off when up walking but better than Saturday.

## 2019-03-25 NOTE — Consult Note (Signed)
PHARMACY CONSULT NOTE - FOLLOW UP  Pharmacy Consult for Electrolyte Monitoring and Replacement   Recent Labs: Potassium (mmol/L)  Date Value  03/25/2019 4.2   Magnesium (mg/dL)  Date Value  03/25/2019 2.0   Calcium (mg/dL)  Date Value  03/25/2019 8.9   Albumin (g/dL)  Date Value  03/24/2019 3.4 (L)   Phosphorus (mg/dL)  Date Value  03/25/2019 4.5   Sodium (mmol/L)  Date Value  03/25/2019 136  07/15/2014 136 (A)    Assessment: 78 year old presenting with dizziness nausea vomiting. She was brought to CCU with an acute cerebellar infarct with mild to moderate swelling but without hydrocephalus  Goal of Therapy:  Potassium > 4 mmol/L Magnesium > 2 mg/dL Phosphorous > 3 mg/dL Calcium > 7.5 mg/dL  Plan:   No replacement warranted at this time.   Follow-up electrolytes in am and replace as needed  Hal Norrington L , RPh 03/25/2019 7:46 AM

## 2019-03-26 LAB — GLUCOSE, CAPILLARY
Glucose-Capillary: 173 mg/dL — ABNORMAL HIGH (ref 70–99)
Glucose-Capillary: 181 mg/dL — ABNORMAL HIGH (ref 70–99)
Glucose-Capillary: 216 mg/dL — ABNORMAL HIGH (ref 70–99)
Glucose-Capillary: 222 mg/dL — ABNORMAL HIGH (ref 70–99)
Glucose-Capillary: 231 mg/dL — ABNORMAL HIGH (ref 70–99)

## 2019-03-26 NOTE — Progress Notes (Signed)
[facility] at [place] NAME: Mathew Baker    MR#:  [id number]  DATE OF BIRTH:  1941/08/20  SUBJECTIVE:  CHIEF COMPLAINT: pt  Denies any complaint.no dizziness.Marland Kitchen REVIEW OF SYSTEMS:  CONSTITUTIONAL: No fever, fatigue or weakness.  EYES: No blurred or double vision.  EARS, NOSE, AND THROAT: No tinnitus or ear pain.  RESPIRATORY: No cough, shortness of breath, wheezing or hemoptysis.  CARDIOVASCULAR: No chest pain, orthopnea, edema.  GASTROINTESTINAL: No nausea, vomiting, diarrhea or abdominal pain.  GENITOURINARY: No dysuria, hematuria.  ENDOCRINE: No polyuria, nocturia,  HEMATOLOGY: No anemia, easy bruising or bleeding SKIN: No rash or lesion. MUSCULOSKELETAL: No joint pain or arthritis.   NEUROLOGIC: No tingling, numbness, weakness.  PSYCHIATRY: No anxiety or depression.   DRUG ALLERGIES:  No Known Allergies  VITALS:  Blood pressure (!) 127/99, pulse 75, temperature 97.7 F (36.5 C), temperature source Oral, resp. rate 16, height 6\' 1"  (1.854 m), weight 100.1 kg, SpO2 100 %.  PHYSICAL EXAMINATION:  GENERAL:  78 y.o.-year-old patient lying in the bed with no acute distress.  EYES: Pupils equal, round, reactive to light and accommodation. No scleral icterus. Extraocular muscles intact.  HEENT: Head atraumatic, normocephalic. Oropharynx and nasopharynx clear.  NECK:  Supple, no jugular venous distention. No thyroid enlargement, no tenderness.  LUNGS: Normal breath sounds bilaterally, no wheezing, rales,rhonchi or crepitation. No use of accessory muscles of respiration.  CARDIOVASCULAR: S1, S2 normal. No murmurs, rubs, or gallops.  ABDOMEN: Soft, nontender, nondistended. Bowel sounds present.  EXTREMITIES: No pedal edema, cyanosis, or clubbing.  NEUROLOGIC: Cranial nerves II through XII are intact. Muscle strength 5/5 in all extremities, ptosis not present, alert, awake, oriented.  Smile is symmetrical, hearing is intact.  Gag reflex  present, all cranial nerves II through XII intact.  Pinprick and light touch intact throughout bilaterally.  Visual fields are grossly normal.  PSYCHIATRIC: The patient is alert and oriented x 3.  SKIN: No obvious rash, lesion, or ulcer.    LABORATORY PANEL:   CBC Recent Labs  Lab 03/25/19 0457  WBC 10.1  HGB 13.2  HCT 38.6*  PLT 224   ------------------------------------------------------------------------------------------------------------------  Chemistries  Recent Labs  Lab 03/25/19 0457  NA 136  K 4.2  CL 102  CO2 26  GLUCOSE 185*  BUN 19  CREATININE 0.84  CALCIUM 8.9  MG 2.0   ------------------------------------------------------------------------------------------------------------------  Cardiac Enzymes No results for input(s): TROPONINI in the last 168 hours. ------------------------------------------------------------------------------------------------------------------  RADIOLOGY:  No results found.  EKG:   Orders placed or performed during the hospital encounter of 12/20/16  . ED EKG  . ED EKG  . EKG 12-Lead  . EKG 12-Lead  . ED EKG  . ED EKG  . EKG 12-Lead  . EKG 12-Lead  . EKG  . EKG    ASSESSMENT AND PLAN:   Patient 78 year old presenting with dizziness nausea vomiting  #Acute cerebellar infarct with surrounding edema, patient had  MRI of the brain showed no signs of hydrocephalus but  smaller fourth ventricle.  Patient is alert, awake, oriented with no dizziness, blurred vision, neurological exam remained stable.  Seen by neurology today, Dr. Irish Elders, started on aspirin, patient feels well today without any dizziness, blurred vision.  PT/OT/speech therapy assessment-CIR However patient wants to go home with home health physical therapy.  #.   Dizziness probably Symptomatic bradycardia patient does not appear to be on any heart  rate lowering medications Asymptomatic Cardiology is following, heart rate is stable  at around 60s We  will monitor on telemetry Echocardiogram of the heart-60 to 65% ejection fraction, normal systolic function   -stress test in May 2018 has revealed normal LV function with EF estimated at 57% no evidence of stress-induced ischemia -Carotid Dopplers with no significant stenosis  2.  Diabetes type 2 we will continue Lantus hold metformin due to nausea, I will also hold Glucotrol XL, continue sliding scale insulin with coverage. Dietitian-provided diet education   3.  Hyperlipidemia continue Lipitor  4.  Glaucoma continue eyedrops  5.  Hypertension continue lisinopril monitor blood pressure  6.  Acute gastritis we will check GI panel PPI.  H. pylori negative  PT consult reviewed, given the patient wants to go home patient is high risk for falls and far from his baseline and getting risk of falls if he returns home, physical therapy is recommending CIR. All the records are reviewed and case discussed with Care Management/Social Workerr. Management plans discussed with the patient, try to reach patient's wife at [phone] could not reach her and voicemail box set up  CODE STATUS: fc  TOTAL TIME TAKING CARE OF THIS PATIENT: 38 minutes.   POSSIBLE D/C IN  DAYS, DEPENDING ON CLINICAL CONDITION. More than 50% time spent in counseling, coordination of care Note: This dictation was prepared with Dragon dictation along with smaller phrase technology. Any transcriptional errors that result from this process are unintentional.   Epifanio Lesches M.D on 03/26/2019 at 11:28 AM  Between 7am to 6pm - Pager - [phone] After 6pm go to [email or web address] - password [email or web address] [facility]  Office  [phone]  CC: Primary care physician; Patient, No Pcp Per

## 2019-03-26 NOTE — Progress Notes (Signed)
OT Cancellation Note  Patient Details Name: Mathew Baker MRN: [id number] DOB: 11-Dec-1940   Cancelled Treatment:    Reason Eval/Treat Not Completed: Patient declined, no reason specified. Spoke with PTA who recently left pt's room and pt refusing therapy this am. Will re-attempt this afternoon as schedule permits.   Jeni Salles, MPH, MS, OTR/L ascom [phone] 03/26/19, 11:37 AM

## 2019-03-26 NOTE — Progress Notes (Signed)
Pt is awaiting transfer to [facility] floor. No ongoing PCCM needs. Not officially seen by PCCM team on this day. We will remain available as needed  Merton Border, MD PCCM service Mobile [phone] Pager [phone] 03/26/2019 12:41 PM

## 2019-03-26 NOTE — Progress Notes (Signed)
[facility] [place] NAME: Mathew Baker    MR#:  [id number]  DATE OF BIRTH:  18-Aug-1941  SUBJECTIVE:  CHIEF COMPLAINT: pt  Denies any complaint.no dizziness.[facility] Physical therapy recommends inpatient rehab.  Patient denies any dizziness, double vision, no new weakness, says he is very bored REVIEW OF SYSTEMS:  CONSTITUTIONAL: No fever, fatigue or weakness.  EYES: No blurred or double vision.  EARS, NOSE, AND THROAT: No tinnitus or ear pain.  RESPIRATORY: No cough, shortness of breath, wheezing or hemoptysis.  CARDIOVASCULAR: No chest pain, orthopnea, edema.  GASTROINTESTINAL: No nausea, vomiting, diarrhea or abdominal pain.  GENITOURINARY: No dysuria, hematuria.  ENDOCRINE: No polyuria, nocturia,  HEMATOLOGY: No anemia, easy bruising or bleeding SKIN: No rash or lesion. MUSCULOSKELETAL: No joint pain or arthritis.   NEUROLOGIC: No tingling, numbness, weakness.  PSYCHIATRY: No anxiety or depression.   DRUG ALLERGIES:  No Known Allergies  VITALS:  Blood pressure (!) 127/99, pulse 75, temperature 97.7 F (36.5 C), temperature source Oral, resp. rate 16, height 6\' 1"  (1.854 m), weight 100.1 kg, SpO2 100 %.  PHYSICAL EXAMINATION:  GENERAL:  78 y.o.-year-old patient lying in the bed with no acute distress.  EYES: Pupils equal, round, reactive to light and accommodation. No scleral icterus. Extraocular muscles intact.  HEENT: Head atraumatic, normocephalic. Oropharynx and nasopharynx clear.  NECK:  Supple, no jugular venous distention. No thyroid enlargement, no tenderness.  LUNGS: Normal breath sounds bilaterally, no wheezing, rales,rhonchi or crepitation. No use of accessory muscles of respiration.  CARDIOVASCULAR: S1, S2 normal. No murmurs, rubs, or gallops.  ABDOMEN: Soft, nontender, nondistended. Bowel sounds present.  EXTREMITIES: No pedal edema, cyanosis, or clubbing.  NEUROLOGIC: Cranial nerves II through XII are intact. Muscle  strength 5/5 in all extremities, ptosis not present, alert, awake, oriented.  Smile is symmetrical, hearing is intact.  Gag reflex present, all cranial nerves II through XII intact.  Pinprick and light touch intact throughout bilaterally.  Visual fields are grossly normal.  PSYCHIATRIC: The patient is alert and oriented x 3.  SKIN: No obvious rash, lesion, or ulcer.    LABORATORY PANEL:   CBC Recent Labs  Lab 03/25/19 0457  WBC 10.1  HGB 13.2  HCT 38.6*  PLT 224   ------------------------------------------------------------------------------------------------------------------  Chemistries  Recent Labs  Lab 03/25/19 0457  NA 136  K 4.2  CL 102  CO2 26  GLUCOSE 185*  BUN 19  CREATININE 0.84  CALCIUM 8.9  MG 2.0   ------------------------------------------------------------------------------------------------------------------  Cardiac Enzymes No results for input(s): TROPONINI in the last 168 hours. ------------------------------------------------------------------------------------------------------------------  RADIOLOGY:  No results found.  EKG:   Orders placed or performed during the hospital encounter of 12/20/16  . ED EKG  . ED EKG  . EKG 12-Lead  . EKG 12-Lead  . ED EKG  . ED EKG  . EKG 12-Lead  . EKG 12-Lead  . EKG  . EKG    ASSESSMENT AND PLAN:   Patient 78 year old presenting with dizziness nausea vomiting  #Acute cerebellar infarct with surrounding edema, patient had  MRI of the brain showed no signs of hydrocephalus but  smaller fourth ventricle.  Patient is alert, awake, oriented with no dizziness, blurred vision, neurological exam remained stable.  Seen by neurology ,Dr. Irish Elders, started on aspirin, patient feels well today without any dizziness, blurred vision.   physical therapy recommends CIR.  #.   Dizziness probably Symptomatic bradycardia patient does not appear to be on any heart  rate  lowering medications Asymptomatic Cardiology  is following, heart rate is stable at around 50s We will monitor on telemetry Echocardiogram of the heart-60 to 65% ejection fraction, normal systolic function   -stress test in May 2018 has revealed normal LV function with EF estimated at 57% no evidence of stress-induced ischemia -Carotid Dopplers with no significant stenosis  2.  Diabetes type 2 'we will continue Lantus., metformin dietitian-provided diet education   3.  Hyperlipidemia continue Lipitor  4.  Glaucoma continue eyedrops  5.  Hypertension continue lisinopril monitor blood pressure  6.  Acute gastritis ; improved,  PT consult reviewed, physical therapy recommends inpatient rehab with CIR, patient will go to medical floor from ICU today, waiting for inpatient rehab coordinator at [facility] for final disposition. All the records are reviewed and case discussed with Care Management/Social Workerr. Management plans discussed with the patient, try to reach patient's wife at [phone] could not reach her and voicemail box set up  CODE STATUS: fc  TOTAL TIME TAKING CARE OF THIS PATIENT: 38 minutes.   POSSIBLE D/C IN  DAYS, DEPENDING ON CLINICAL CONDITION. More than 50% time spent in counseling, coordination of care Note: This dictation was prepared with Dragon dictation along with smaller phrase technology. Any transcriptional errors that result from this process are unintentional.   Epifanio Lesches M.D on 03/26/2019 at 3:01 PM  Between 7am to 6pm - Pager - [phone] After 6pm go to [email or web address] - password [email or web address] [facility]  Office  [phone]  CC: Primary care physician; Patient, No Pcp Per

## 2019-03-26 NOTE — Progress Notes (Signed)
Physical Therapy Treatment Patient Details Name: Mathew Baker MRN: [id number] DOB: 09/09/1940 Today's Date: 03/26/2019    History of Present Illness pt is 78 y.o. male with a PMH notable for Type 2 DM, COPD, HTN, and Hyperlipidemia who presented to [facility] ED on 03/19/19 due to a 3 week history of intermittent dizziness, near syncopal episodes, and a one day history of nausea/vomiting.  in ED bradycardic with HR in the 30's. His SARS-CoV-2 PCR was negative. He was admitted to [place] unit for further workup and treatment of symptomatic Bradycardia and near syncope with Cardiology consultation.  CT Head on 7/15 showed midline cerebellar hypodensity consistent with edema, therefore Neurology was consulted.  MRI of the brain showed bilateral inferior cerebellar infarcts with some 4th ventricular mass effect but no hydrocephalus, transferred to CCU 7/15 for close monitoring, repeat imaging indicated no evidence of hydrocephalus, Hypodensity from infarct remains with some infarct extension and small punctate focus of hemorrhage    PT Comments    Pt in recliner, agrees to session.  Feet put down and he c/o feeling dizzy. 5/10.  Sat for a few minutes and he said it was resolving and stood with min guard to walker.  Upon standing stated he needed to have a BM.  Transferred to bedside commode where he was straining for several minutes.  RN in to assist.  Stood and initially asked for a short walk then stated he did not feel like he could walk right now and needed to rest prior to enema with nursing.  Assisted back to recliner.  RN stated pt walked unit with her this am and gait continues to improve daily but balance disturbances remain.  Will continue to recommend CIR but pt seems pretty firm about returning home despite balance and safety deficits.  Will need HHPT if he chooses to go home along with assist for mobility.   Follow Up Recommendations  CIR     Equipment Recommendations   RW 3-in-1 commode.       Recommendations for Other Services       Precautions / Restrictions Precautions Precautions: Fall Restrictions Weight Bearing Restrictions: No    Mobility  Bed Mobility               General bed mobility comments: deferred up recliner  Transfers     Transfers: Sit to/from Stand Sit to Stand: Min guard            Ambulation/Gait             General Gait Details: deferred- see note   Stairs             Wheelchair Mobility    Modified Rankin (Stroke Patients Only)       Balance Overall balance assessment: Needs assistance Sitting-balance support: Feet supported Sitting balance-Leahy Scale: Good     Standing balance support: Bilateral upper extremity supported Standing balance-Leahy Scale: Poor                              Cognition Arousal/Alertness: Awake/alert Behavior During Therapy: WFL for tasks assessed/performed Overall Cognitive Status: Within Functional Limits for tasks assessed                                        Exercises      General Comments  Pertinent Vitals/Pain Pain Assessment: No/denies pain    Home Living                      Prior Function            PT Goals (current goals can now be found in the care plan section) Progress towards PT goals: Progressing toward goals    Frequency    7X/week      PT Plan Current plan remains appropriate    Co-evaluation              AM-PAC PT "6 Clicks" Mobility   Outcome Measure  Help needed turning from your back to your side while in a flat bed without using bedrails?: A Little Help needed moving from lying on your back to sitting on the side of a flat bed without using bedrails?: A Little Help needed moving to and from a bed to a chair (including a wheelchair)?: A Little Help needed standing up from a chair using your arms (e.g., wheelchair or bedside chair)?: A Little Help needed to walk in  hospital room?: A Little Help needed climbing 3-5 steps with a railing? : A Lot 6 Click Score: 17    End of Session Equipment Utilized During Treatment: Gait belt Activity Tolerance: Other (comment) Patient left: in chair;with call bell/phone within reach;with nursing/sitter in room Nurse Communication: Mobility status;Other (comment) Hemiplegia - Right/Left: Right Hemiplegia - dominant/non-dominant: Non-dominant Hemiplegia - caused by: Cerebral infarction     Time: 0233-4356 PT Time Calculation (min) (ACUTE ONLY): 14 min  Charges:  $Therapeutic Activity: 8-22 mins                     Chesley Noon, PTA 03/26/19, 1:31 PM

## 2019-03-27 LAB — GLUCOSE, CAPILLARY
Glucose-Capillary: 186 mg/dL — ABNORMAL HIGH (ref 70–99)
Glucose-Capillary: 177 mg/dL — ABNORMAL HIGH (ref 70–99)
Glucose-Capillary: 260 mg/dL — ABNORMAL HIGH (ref 70–99)

## 2019-03-27 NOTE — TOC Transition Note (Signed)
Transition of Care [facility]) - CM/SW Discharge Note   Patient Details  Name: Aldous Housel MRN: [id number] Date of Birth: December 10, 1940  Transition of Care [facility]) CM/SW Contact:  Katrina Stack, RN Phone Number: 03/27/2019, 7:25 PM   Clinical Narrative:   Patient and his wife very much decline acute inpatient rehab. Agreeable to home health. No agency preference. Referral to  [facility] and patient will be seen 7/22.  Patient received walker from [employer] and patient and wife decline the need for commode chair    Final next level of care: [place]     Patient Goals and CMS Choice   CMS Medicare.gov Compare Post Acute Care list provided to:: Patient Choice offered to / list presented to : Patient  Discharge Placement                       Discharge Plan and Services                DME Arranged: Gilford Rile   Date DME Agency Contacted: 03/27/19   Representative spoke with at DME Agency: brad HH Arranged: RN, PT, OT, Social Work [employer] Agency: [place] Date [place]: 03/27/19   Representative spoke with at [place]: [place]) Interventions     Readmission Risk Interventions No flowsheet data found.

## 2019-03-27 NOTE — Plan of Care (Signed)
  Problem: Education: Goal: Knowledge of General Education information will improve Description: Including pain rating scale, medication(s)/side effects and non-pharmacologic comfort measures Outcome: Adequate for Discharge   Problem: Health Behavior/Discharge Planning: Goal: Ability to manage health-related needs will improve Outcome: Adequate for Discharge   Problem: Clinical Measurements: Goal: Ability to maintain clinical measurements within normal limits will improve Outcome: Adequate for Discharge   Problem: Activity: Goal: Risk for activity intolerance will decrease Outcome: Adequate for Discharge

## 2019-03-27 NOTE — Progress Notes (Signed)
Inpatient Diabetes Program Recommendations  AACE/ADA: New Consensus Statement on Inpatient Glycemic Control (2015)  Target Ranges:  Prepandial:   less than 140 mg/dL      Peak postprandial:   less than 180 mg/dL (1-2 hours)      Critically ill patients:  140 - 180 mg/dL   Lab Results  Component Value Date   GLUCAP 260 (H) 03/27/2019   HGBA1C 9.0 (H) 03/19/2019    Review of Glycemic Control Results for Mathew Baker, Mathew Baker (MRN [id number]) as of 03/27/2019 12:24  Ref. Range 03/26/2019 21:55 03/27/2019 03:16 03/27/2019 08:06 03/27/2019 12:03  Glucose-Capillary Latest Ref Range: 70 - 99 mg/dL 231 (H) 186 (H) 177 (H) 260 (H)  Diabetes history: DM2 Outpatient Diabetes medications: Trulicity 1.5 mg Qweek (Monday), Lantus 70 units BID, Metformin 1000 mg BID, Glipizide XL 5 mg QAM Current orders for Inpatient glycemic control: Lantus 20 units BID, Novolog 0-9 units TID with meals, Novolog 0-5 units QHS  Inpatient Diabetes Program Recommendations:    May consider adding Novolog meal coverage 4 units tid with meals (hold if patient eats less than 50%).   Thanks  Adah Perl, RN, BC-ADM Inpatient Diabetes Coordinator Pager [phone] (8a-5p)

## 2019-03-27 NOTE — Progress Notes (Signed)
Physical Therapy Treatment Patient Details Name: Mathew Baker MRN: [id number] DOB: 04/19/1941 Today's Date: 03/27/2019    History of Present Illness pt is 78 y.o. male with a PMH notable for Type 2 DM, COPD, HTN, and Hyperlipidemia who presented to [facility] ED on 03/19/19 due to a 3 week history of intermittent dizziness, near syncopal episodes, and a one day history of nausea/vomiting.  in ED bradycardic with HR in the 30's. His SARS-CoV-2 PCR was negative. He was admitted to [place] unit for further workup and treatment of symptomatic Bradycardia and near syncope with Cardiology consultation.  CT Head on 7/15 showed midline cerebellar hypodensity consistent with edema, therefore Neurology was consulted.  MRI of the brain showed bilateral inferior cerebellar infarcts with some 4th ventricular mass effect but no hydrocephalus, transferred to CCU 7/15 for close monitoring, repeat imaging indicated no evidence of hydrocephalus, Hypodensity from infarct remains with some infarct extension and small punctate focus of hemorrhage    PT Comments    Patient alert, pleasant, agreeable to PT no complaints. Patient demonstrated bed mobility mod I, able to sit EOB and perform several exercises with verbal cues for activity pacing/coordination. Sit <> stand with RW and CGA multiple times during session, continued to stabilize legs posteriorly against surface he is rising from, mild deficits in initial standing balance noted. Pt demonstrated good tolerance to ambulation this session, minA with RW to maintain safety. Pt exhibited variability in gait velocity, staggering L and R, and instability with turns. Standing balance exercises performed as well, minA and multimodal cues utilized, challenged by narrow base of support. Pt up in chair all needs in reach at end of session. The patient is still adamant about returning home despite encouragement and education about CIR for his condition. Educated about importance of safety  in home with RW.      Follow Up Recommendations  CIR     Equipment Recommendations  Rolling walker with 5" wheels    Recommendations for Other Services OT consult     Precautions / Restrictions Precautions Precautions: Fall Restrictions Weight Bearing Restrictions: No    Mobility  Bed Mobility Overal bed mobility: Modified Independent                Transfers Overall transfer level: Needs assistance Equipment used: None;Rolling walker (2 wheeled) Transfers: Sit to/from Stand Sit to Stand: Min guard         General transfer comment: mod verbal cues for hand placements. Pt stabilizes with posterior legs against surface during transfers  Ambulation/Gait Ambulation/Gait assistance: Min assist;Min guard Gait Distance (Feet): 160 Feet Assistive device: Rolling walker (2 wheeled) Gait Pattern/deviations: Step-through pattern;Drifts right/left;Staggering right;Staggering left     General Gait Details: Pt gait velocity variable, often with staggering L and R, cues for activity pacing, RW use, safety, minA for intermittent LOB   Stairs             Wheelchair Mobility    Modified Rankin (Stroke Patients Only)       Balance Overall balance assessment: Needs assistance Sitting-balance support: Feet supported Sitting balance-Leahy Scale: Good     Standing balance support: Bilateral upper extremity supported Standing balance-Leahy Scale: Poor Standing balance comment: min-mod A x1 to maintain balance with weight shifts                            Cognition Arousal/Alertness: Awake/alert Behavior During Therapy: WFL for tasks assessed/performed Overall Cognitive Status: Within Functional Limits for  tasks assessed                                 General Comments: alert and oriented, follows all commands      Exercises Other Exercises Other Exercises: standing balance without UE support with wide base of support. MinA with  posterior lean noted, able to correct intermittently with tactile and verbal cues Other Exercises: standing balance without UE support with slightly narrowed base of support, posterior lean noted throughout positioning, cues for balance strategies, minA    General Comments        Pertinent Vitals/Pain Pain Assessment: No/denies pain    Home Living                      Prior Function            PT Goals (current goals can now be found in the care plan section) Progress towards PT goals: Progressing toward goals    Frequency    7X/week      PT Plan Current plan remains appropriate    Co-evaluation              AM-PAC PT "6 Clicks" Mobility   Outcome Measure  Help needed turning from your back to your side while in a flat bed without using bedrails?: A Little Help needed moving from lying on your back to sitting on the side of a flat bed without using bedrails?: A Little Help needed moving to and from a bed to a chair (including a wheelchair)?: A Little Help needed standing up from a chair using your arms (e.g., wheelchair or bedside chair)?: A Little Help needed to walk in hospital room?: A Little Help needed climbing 3-5 steps with a railing? : A Lot 6 Click Score: 17    End of Session Equipment Utilized During Treatment: Gait belt Activity Tolerance: Other (comment) Patient left: in chair;with call bell/phone within reach;with nursing/sitter in room Nurse Communication: Mobility status PT Visit Diagnosis: Muscle weakness (generalized) (M62.81);Other abnormalities of gait and mobility (R26.89);Hemiplegia and hemiparesis;Other symptoms and signs involving the nervous system (R29.898);Ataxic gait (R26.0) Hemiplegia - Right/Left: Right Hemiplegia - dominant/non-dominant: Non-dominant Hemiplegia - caused by: Cerebral infarction     Time: 8280-0349 PT Time Calculation (min) (ACUTE ONLY): 27 min  Charges:  $Gait Training: 8-22 mins $Neuromuscular  Re-education: 8-22 mins                    Lieutenant Diego PT, DPT 1:31 PM,03/27/19 [phone]

## 2019-03-27 NOTE — Progress Notes (Signed)
Patient discharged home today with wife, prescriptions provided they state they will pick up at [facility], walker sent home with patient. Case Manager arranged for home health, PT OT SLP.

## 2019-03-27 NOTE — Care Management Important Message (Signed)
Important Message  Patient Details  Name: Mathew Baker MRN: [id number] Date of Birth: Apr 10, 1941   Medicare Important Message Given:  Yes     Dannette Barbara 03/27/2019, 11:16 AM

## 2019-03-27 NOTE — Consult Note (Signed)
PHARMACY CONSULT NOTE - FOLLOW UP  Pharmacy Consult for Electrolyte Monitoring and Replacement   Recent Labs: Potassium (mmol/L)  Date Value  03/25/2019 4.2   Magnesium (mg/dL)  Date Value  03/25/2019 2.0   Calcium (mg/dL)  Date Value  03/25/2019 8.9   Albumin (g/dL)  Date Value  03/24/2019 3.4 (L)   Phosphorus (mg/dL)  Date Value  03/25/2019 4.5   Sodium (mmol/L)  Date Value  03/25/2019 136  07/15/2014 136 (A)    Assessment: 78 year old presenting with dizziness nausea vomiting. She was brought to CCU with an acute cerebellar infarct with mild to moderate swelling but without hydrocephalus  Goal of Therapy:  Potassium > 4 mmol/L Magnesium > 2 mg/dL Phosphorous > 3 mg/dL Calcium > 7.5 mg/dL  Plan:   No replacement warranted at this time.   No new labs obtained.   Oswald Hillock , RPh 03/27/2019 8:51 AM

## 2019-03-27 NOTE — Progress Notes (Addendum)
Inpatient Rehabilitation-Admissions Coordinator   [facility] continues to follow along. Noted pt still refusing CIR per therapy notes. I have contacted CM, Nann to see if pt shows any interest in post acute rehab. Await her call back.   Jhonnie Garner, OTR/L  Rehab Admissions Coordinator  [phone] 03/27/2019 12:07 PM  Addendum: per Moishe Spice, the patient is NOT interested in CIR. AC will no longer follow.   Jhonnie Garner, OTR/L  Rehab Admissions Coordinator  [phone] 03/27/2019 12:13 PM

## 2019-03-28 DIAGNOSIS — H409 Unspecified glaucoma: Secondary | ICD-10-CM | POA: Diagnosis not present

## 2019-03-28 DIAGNOSIS — Z794 Long term (current) use of insulin: Secondary | ICD-10-CM | POA: Diagnosis not present

## 2019-03-28 DIAGNOSIS — Z9181 History of falling: Secondary | ICD-10-CM | POA: Diagnosis not present

## 2019-03-28 DIAGNOSIS — E785 Hyperlipidemia, unspecified: Secondary | ICD-10-CM | POA: Diagnosis not present

## 2019-03-28 DIAGNOSIS — I4891 Unspecified atrial fibrillation: Secondary | ICD-10-CM | POA: Diagnosis not present

## 2019-03-28 DIAGNOSIS — I10 Essential (primary) hypertension: Secondary | ICD-10-CM | POA: Diagnosis not present

## 2019-03-28 DIAGNOSIS — Z8701 Personal history of pneumonia (recurrent): Secondary | ICD-10-CM | POA: Diagnosis not present

## 2019-03-28 DIAGNOSIS — M5136 Other intervertebral disc degeneration, lumbar region: Secondary | ICD-10-CM | POA: Diagnosis not present

## 2019-03-28 DIAGNOSIS — Z8673 Personal history of transient ischemic attack (TIA), and cerebral infarction without residual deficits: Secondary | ICD-10-CM | POA: Diagnosis not present

## 2019-03-28 DIAGNOSIS — Z87891 Personal history of nicotine dependence: Secondary | ICD-10-CM | POA: Diagnosis not present

## 2019-03-28 DIAGNOSIS — Z7982 Long term (current) use of aspirin: Secondary | ICD-10-CM | POA: Diagnosis not present

## 2019-03-28 DIAGNOSIS — R001 Bradycardia, unspecified: Secondary | ICD-10-CM | POA: Diagnosis not present

## 2019-03-28 DIAGNOSIS — E119 Type 2 diabetes mellitus without complications: Secondary | ICD-10-CM | POA: Diagnosis not present

## 2019-03-28 DIAGNOSIS — J449 Chronic obstructive pulmonary disease, unspecified: Secondary | ICD-10-CM | POA: Diagnosis not present

## 2019-03-28 DIAGNOSIS — M1710 Unilateral primary osteoarthritis, unspecified knee: Secondary | ICD-10-CM | POA: Diagnosis not present

## 2019-03-28 NOTE — Discharge Summary (Signed)
Mathew Baker, is a 78 y.o. male  DOB 1941-04-04  MRN [id number].  Admission date:  03/19/2019  Admitting Physician  Dustin Flock, MD  Discharge Date:  03/27/2019   Primary MD  Patient, No Pcp Per  Recommendations for primary care physician for things to follow:   [facility]. [facility] Diagnosis  Symptomatic bradycardia [R00.1] Non-intractable vomiting with nausea, unspecified vomiting type [R11.2]   Discharge Diagnosis  Symptomatic bradycardia [R00.1] Non-intractable vomiting with nausea, unspecified vomiting type [R11.2]   Active Problems:   Bradycardia      Past Medical History:  Diagnosis Date  . Chronic airway obstruction ([place])   . Degeneration of intervertebral disc of lumbar region   . Diabetes mellitus without complication ([place])   . Essential hypertension, benign   . Hyperlipidemia   . Hypertension   . Osteoarthritis of lower extremity   . Pneumonia, organism unspecified(486)   . PSA elevation 07/16/2013   Normal    Past Surgical History:  Procedure Laterality Date  . CATARACT EXTRACTION W/ INTRAOCULAR LENS  IMPLANT, BILATERAL    . COLONOSCOPY  2006   Normal: Repeat in 10 yrs       History of present illness and  Hospital Course:     Kindly see H&P for history of present illness and admission details, please review complete Labs, Consult reports and Test reports for all details in brief  HPI  from the history and physical done on the day of admission 78 year old male patient with history of hypertension, hyperlipidemia, diabetes mellitus without complication, back pain came in because of nausea, dizziness, found to have bradycardia with heart rate low 30s.  Patient is admitted for symptomatic bradycardia.  Hospital Course  #1 symptomatic bradycardia, seen by Dr. Ubaldo Glassing from  cardiology, patient on not on not any  heart rate lowering medication.  Echocardiogram is done, which showed no EF more than 55%.  Heart rate improved on its own. 2.  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  Neurosurgery reviewed the pictures of CT head, did not feel he needs any neurosurgical intervention, patient CT head showed cerebellar stroke with mild to moderate swelling but without hydrocephalus.  He recommended continued observation ICU if patient develops any altered mental status, headache patient noted to be transferred but patient did not have any further blurred vision or dizziness, echocardiogram showed EF 60 to 65%, stroke work-up also included ultrasound of carotids did not show any hemodynamically signal stenosis.  Patient dizziness improved did not have any further episodes of dizziness, blurred vision.  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  Patient had consecutive CTs of the head on July 14, July 16, July 17 which did not show worsening hemorrhage, MRI of the brain showed new wedge-shaped focus of infarct in right midportion of cerebellum, fourth ventricle is narrow but not obstructed.  Patient also seen better with Dr. Barkley Bruns, because patient dizziness, hemorrhage did not worsen started on aspirin 125 mg daily.  Physical therapy commended inpatient rehab but patient refused and he wanted to go home.  We arranged home health physical therapy, Occupational Therapy.  Patient will continue aspirin 325 mg daily, atorvastatin 80  mg p.o. daily   2.  Diabetes mellitus type 2, patient was taking glipizide,  metformin but to stop it and started on Trulicity by PCP along with insulin with sliding scale coverage. 3.  Essential hypertension, controlled, continue Norvasc milligram p.o. daily, lisinopril 40 mg p.o. daily.    Discharge Condition: stable  Follow UP  Follow-up Information    [facility] In 1 week.        Teodoro Spray, MD. Go on 04/06/2019.   Specialty: Cardiology Why: appointment at [facility] information: [place]  [id number] [phone]             Discharge Instructions  and  Discharge Medications      Allergies as of 03/27/2019   No Known Allergies     Medication List    STOP taking these medications   glipiZIDE 5 MG 24 hr tablet Commonly known as: GLUCOTROL XL   guaiFENesin 600 MG 12 hr tablet Commonly known as: MUCINEX   insulin glargine 100 UNIT/ML injection Commonly known as: Lantus   metFORMIN 1000 MG tablet Commonly known as: GLUCOPHAGE     TAKE these medications   albuterol 108 (90 Base) MCG/ACT inhaler Commonly known as: VENTOLIN HFA Inhale 2 puffs into the lungs 4 (four) times daily as needed for wheezing or shortness of breath.   amLODipine 10 MG tablet Commonly known as: NORVASC Take 10 mg by mouth daily.   aspirin 325 MG tablet Take 1 tablet (325 mg total) by mouth daily.   atorvastatin 80 MG tablet Commonly known as: LIPITOR Take 40 mg by mouth at bedtime.   diclofenac sodium 1 % Gel Commonly known as: VOLTAREN Apply 1 application topically daily as needed (shoulder pain).   glucose blood test strip Commonly known as: PRECISION XTRA TEST STRIPS Use to test blood sugar 2 times daily as instructed. Dx: E11.65   HYDROcodone-acetaminophen 5-325 MG tablet Commonly known as: NORCO/VICODIN Take 1 tablet by mouth 2 (two) times a day.   insulin aspart 100 UNIT/ML injection Commonly known as: novoLOG Inject 0-9 Units into the skin 3 (three) times daily with meals.   Insulin Pen Needle 32G X 4 MM  Misc Commonly known as: CareFine Pen Needles Use 1x a day   latanoprost 0.005 % ophthalmic solution Commonly known as: XALATAN Place 1 drop into both eyes at bedtime.   lisinopril 40 MG tablet Commonly known as: ZESTRIL Take 40 mg by mouth daily. What changed: Another medication with the same name was removed. Continue taking this medication, and follow the directions you see here.   Magnesium Oxide 420 MG Tabs Take 420 mg by mouth daily.   sildenafil 100 MG tablet Commonly known as: VIAGRA Take 100 mg by mouth daily as needed for erectile dysfunction.   tamsulosin 0.4 MG Caps capsule Commonly known as: FLOMAX Take 0.4 mg by mouth at bedtime.   tiotropium 18 MCG inhalation capsule Commonly known as: SPIRIVA Place 18 mcg into inhaler and inhale daily.   Trulicity 1.5 IO/9.7DZ Sopn Generic drug: Dulaglutide Inject 1.5 mg into the skin every 7 (seven) days.         Diet and Activity recommendation: See Discharge Instructions above   Consults obtained -neurology, cardiology, neurosurgery, physical therapy, Occupational Therapy   Major procedures and Radiology Reports - PLEASE review detailed and final reports for all details, in brief -      Ct Head Wo Contrast  Result Date: 03/23/2019 CLINICAL DATA:  Follow-up for possible  intracranial hemorrhage. EXAM: CT HEAD WITHOUT CONTRAST TECHNIQUE: Contiguous axial images were obtained from the base of the skull through the vertex without intravenous contrast. COMPARISON:  03/22/2019, brain MRI, 03/21/2019 and head CTs, 03/20/2019 and 12/20/2016. FINDINGS: Brain: Hypoattenuation with minimal areas of punctate hyperattenuation in the inferior, midline cerebellum centered on the vermis, is similar to the CT from 03/20/2019. Is a small focus of hypoattenuation along the peripheral mid right cerebellar hemisphere, which appears new. Partial effacement the fourth ventricle is again noted similar to the prior CT. Remaining ventricles  are in size and configuration. Mild areas of white matter hypoattenuation are noted in the cerebral hemispheres bilaterally consistent chronic microvascular ischemic change. No other evidence of recent infarction. Vascular: No hyperdense vessel or unexpected calcification. Skull: Normal. Negative for fracture or focal lesion. Sinuses/Orbits: No acute finding. Other: None. IMPRESSION: 1. Small punctate foci hyperattenuation lie within the area of hypoattenuation along the midline cerebellum, consistent with subacute infarction and small foci of petechial hemorrhage. This is unchanged from the previous CT scan. 2. Small area of hypoattenuation has developed in the mid right peripheral cerebellum, consistent a small focus of infarct extension. 3. No other change from prior studies. No other evidence intracranial hemorrhage. No hydrocephalus. Electronically Signed   By: Lajean Manes M.D.   On: 03/23/2019 08:55   Ct Head Wo Contrast  Result Date: 03/22/2019 CLINICAL DATA:  Follow-up stroke EXAM: CT HEAD WITHOUT CONTRAST TECHNIQUE: Contiguous axial images were obtained from the base of the skull through the vertex without intravenous contrast. COMPARISON:  MR brain, 03/21/2019, CT brain, 03/20/2019 FINDINGS: Brain: Redemonstrated hypodensity of the midline cerebellum and cerebellar vermis, with new, heterogeneous internal hyperdensity consistent with evolution of petechial hemorrhage appreciated on prior MRI (series 2, image 7). The fourth ventricle is partially effaced, as seen on prior MRI and CT, without evidence of developing hydrocephalus or significant change in caliber of the lateral and third ventricles. Underlying small-vessel white matter disease. Vascular: No hyperdense vessel or unexpected calcification. Skull: Normal. Negative for fracture or focal lesion. Sinuses/Orbits: No acute finding. Other: None. IMPRESSION: 1. Redemonstrated hypodensity of the midline cerebellum and cerebellar vermis, with new,  heterogeneous internal hyperdensity consistent with evolution of petechial hemorrhage appreciated on prior MRI (series 2, image 7). 2. The fourth ventricle is partially effaced, as seen on prior MRI and CT, without evidence of developing hydrocephalus or significant change in caliber of the lateral and third ventricles. 3.  Underlying small-vessel white matter disease. Electronically Signed   By: Eddie Candle M.D.   On: 03/22/2019 09:09   Ct Head Wo Contrast  Result Date: 03/20/2019 CLINICAL DATA:  Dizziness, nausea, vomiting. EXAM: CT HEAD WITHOUT CONTRAST TECHNIQUE: Contiguous axial images were obtained from the base of the skull through the vertex without intravenous contrast. COMPARISON:  12/20/2016 FINDINGS: Brain: There is low-density/edema noted in the midline of the cerebellum. 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 However, given the midline nature, recommend MRI for further evaluation. No hydrocephalus. Mild cerebral atrophy and chronic small vessel disease throughout the deep white matter. No hemorrhage. Vascular: No hyperdense vessel or unexpected calcification. Skull: No acute calvarial abnormality. Sinuses/Orbits: Visualized paranasal sinuses and mastoids clear. Orbital soft tissues unremarkable. 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 Given the unusual vascular pattern, recommend MRI with and without contrast for further evaluation. Electronically Signed   By: Rolm Baptise M.D.   On: 03/20/2019 19:24  Mr Angio Head Wo Contrast  Result Date: 03/21/2019 CLINICAL DATA:  78 y/o  M; nausea and vomiting. EXAM: MRI HEAD WITHOUT CONTRAST MRA HEAD WITHOUT CONTRAST TECHNIQUE: Multiplanar, multiecho pulse sequences of the brain and surrounding structures were obtained without intravenous contrast. Angiographic images of the head were obtained using MRA technique without contrast. COMPARISON:   03/20/2019 CT head.  03/21/2019 carotid ultrasound. FINDINGS: MRI HEAD FINDINGS Brain: Reduced diffusion of cerebellar vermis and the medial inferior cerebellar hemispheres of bilaterally compatible with acute/early subacute infarction. There is mild local mass effect with partial effacement of the fourth ventricle and increased T2 FLAIR signal. There is confluent petechial hemorrhage throughout the infarct on susceptibility weighted sequences. There is faint linear enhancement within the area of infarction likely represent small vessel structures. No masslike enhancement. No additional findings for stroke, hemorrhage, extra-axial collection, hydrocephalus, or herniation. Punctate nonspecific T2 FLAIR hyperintensities in subcortical and periventricular white matter are compatible with mild chronic microvascular ischemic changes. Mild volume loss of the brain. Vascular: There is increased susceptibility hypointensity throughout the area Skull and upper cervical spine: Normal marrow signal. Sinuses/Orbits: Mild mucosal thickening of the ethmoid air cells. Additional included paranasal sinuses and mastoid air cells demonstrate normal signal. Bilateral intra-ocular lens replacement. Other: None. MRA HEAD FINDINGS Internal carotid arteries:  Patent. Anterior cerebral arteries:  Patent. Middle cerebral arteries: Patent. Anterior communicating artery: Patent. Posterior communicating arteries:  Patent. Posterior cerebral arteries:  Patent. Basilar artery:  Patent. Vertebral arteries: Loss of flow related signal within the right vertebral artery between the PICA origin and the basilar, however normally enhancing on the contrast enhanced MRI of the brain, uncertain significance. Patent right PICA. No left PICA identified within the field of view. IMPRESSION: MRI head: 1. 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 Positive for hemorrhage, Heidelberg  calssification 1b: HI2, confluent petechiae, no mass effect. Mild local mass effect with partial effacement of fourth ventricle. No hydrocephalus at this time. 2. Mild chronic microvascular ischemic changes and volume loss of the brain. MRA head: 1. Loss of flow related signal within the right vertebral artery between the PICA origin and the basilar, however, the vessel is normally enhancing on contrast enhanced MRI of the brain. The finding is of uncertain significance, possibly artifact. 2. Patent right PICA. No left PICA identified within the field of view. The stroke may be related to a left dominant PICA occlusion. CT angiography of the head and neck may better assess vascular anatomy. 3. Otherwise unremarkable MRA of the head. These results will be called to the ordering clinician or representative by the Radiologist Assistant, and communication documented in the PACS or zVision Dashboard. Electronically Signed   By: Kristine Garbe M.D.   On: 03/21/2019 23:46   Mr Brain Wo Contrast  Result Date: 03/24/2019 CLINICAL DATA:  Nausea and vomiting.  Follow-up cerebellar stroke. EXAM: MRI HEAD WITHOUT CONTRAST TECHNIQUE: Multiplanar, multiecho pulse sequences of the brain and surrounding structures were obtained without intravenous contrast. COMPARISON:  Head CT yesterday.  MRI 03/21/2019 FINDINGS: Brain: Acute infarction in the cerebellar vermis and inferior medial aspects of both cerebellar hemispheres shows the same distribution, with evidence of slightly more petechial hemorrhage. There is a new peripheral wedge-shaped region of acute infarction in the right posterior midportion of the cerebellum. I think the findings remain consistent with Heidelberg classification 1b/HI 2. Elsewhere, moderate chronic small-vessel ischemic changes of the cerebral hemispheric deep white matter appears similar. No hydrocephalus. Fourth ventricle is  narrowed but not obstructed. No extra-axial collection. Vascular:  Absent flow in the right vertebral artery as seen previously. Skull and upper cervical spine: Negative Sinuses/Orbits: Clear/normal Other: None IMPRESSION: Previously seen region of acute infarction affecting the cerebellar vermis and medial aspect of both inferior cerebellar hemispheres appears the same in distribution, with slightly more petechial bleeding. No confluent hematoma suspected. Hemorrhage remains categorized as Heidelberg classification 1b/HI 2. New peripheral wedge-shaped focus of infarction in the peripheral right midportion cerebellum. Fourth ventricle is narrowed but not apparently obstructed. Ventricular size is stable. Electronically Signed   By: Nelson Chimes M.D.   On: 03/24/2019 07:01   Mr Jeri Cos YT Contrast  Result Date: 03/21/2019 CLINICAL DATA:  78 y/o  M; nausea and vomiting. EXAM: MRI HEAD WITHOUT CONTRAST MRA HEAD WITHOUT CONTRAST TECHNIQUE: Multiplanar, multiecho pulse sequences of the brain and surrounding structures were obtained without intravenous contrast. Angiographic images of the head were obtained using MRA technique without contrast. COMPARISON:  03/20/2019 CT head.  03/21/2019 carotid ultrasound. FINDINGS: MRI HEAD FINDINGS Brain: Reduced diffusion of cerebellar vermis and the medial inferior cerebellar hemispheres of bilaterally compatible with acute/early subacute infarction. There is mild local mass effect with partial effacement of the fourth ventricle and increased T2 FLAIR signal. There is confluent petechial hemorrhage throughout the infarct on susceptibility weighted sequences. There is faint linear enhancement within the area of infarction likely represent small vessel structures. No masslike enhancement. No additional findings for stroke, hemorrhage, extra-axial collection, hydrocephalus, or herniation. Punctate nonspecific T2 FLAIR hyperintensities in subcortical and periventricular white matter are compatible with mild chronic microvascular ischemic changes.  Mild volume loss of the brain. Vascular: There is increased susceptibility hypointensity throughout the area Skull and upper cervical spine: Normal marrow signal. Sinuses/Orbits: Mild mucosal thickening of the ethmoid air cells. Additional included paranasal sinuses and mastoid air cells demonstrate normal signal. Bilateral intra-ocular lens replacement. Other: None. MRA HEAD FINDINGS Internal carotid arteries:  Patent. Anterior cerebral arteries:  Patent. Middle cerebral arteries: Patent. Anterior communicating artery: Patent. Posterior communicating arteries:  Patent. Posterior cerebral arteries:  Patent. Basilar artery:  Patent. Vertebral arteries: Loss of flow related signal within the right vertebral artery between the PICA origin and the basilar, however normally enhancing on the contrast enhanced MRI of the brain, uncertain significance. Patent right PICA. No left PICA identified within the field of view. IMPRESSION: MRI head: 1. 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 Positive for hemorrhage, Heidelberg calssification 1b: HI2, confluent petechiae, no mass effect. Mild local mass effect with partial effacement of fourth ventricle. No hydrocephalus at this time. 2. Mild chronic microvascular ischemic changes and volume loss of the brain. MRA head: 1. Loss of flow related signal within the right vertebral artery between the PICA origin and the basilar, however, the vessel is normally enhancing on contrast enhanced MRI of the brain. The finding is of uncertain significance, possibly artifact. 2. Patent right PICA. No left PICA identified within the field of view. The stroke may be related to a left dominant PICA occlusion. CT angiography of the head and neck may better assess vascular anatomy. 3. Otherwise unremarkable MRA of the head. These results will be called to the ordering clinician or representative by the Radiologist  Assistant, and communication documented in the PACS or zVision Dashboard. Electronically Signed   By: Kristine Garbe M.D.   On: 03/21/2019 23:46   US Carotid Bilateral  Result Date: 03/21/2019 CLINICAL DATA:  Dizziness, hypertension, hyperlipidemia,  diabetes, previous tobacco abuse EXAM: BILATERAL CAROTID DUPLEX ULTRASOUND TECHNIQUE: Pearline Cables scale imaging, color Doppler and duplex ultrasound were performed of bilateral carotid and vertebral arteries in the neck. COMPARISON:  None. FINDINGS: Criteria: Quantification of carotid stenosis is based on velocity parameters that correlate the residual internal carotid diameter with NASCET-based stenosis levels, using the diameter of the distal internal carotid lumen as the denominator for stenosis measurement. The following velocity measurements were obtained: RIGHT ICA: 131/17 cm/sec CCA: 250/53 cm/sec SYSTOLIC ICA/CCA RATIO:  1.2 ECA: 136 cm/sec LEFT ICA: 82/12 cm/sec CCA: 97/67 cm/sec SYSTOLIC ICA/CCA RATIO:  0.9 ECA: 167 cm/sec RIGHT CAROTID ARTERY: Eccentric calcified plaque in the bulb and ICA origin resulting in at least mild stenosis. Normal waveforms and color Doppler signal. RIGHT VERTEBRAL ARTERY:  Normal flow direction and waveform. LEFT CAROTID ARTERY: Mild eccentric partially calcified plaque at the bifurcation and in the proximal ICA. No high-grade stenosis. Normal waveforms and color Doppler signal. LEFT VERTEBRAL ARTERY:  Normal flow direction and waveform. IMPRESSION: 1. 1. 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 2. Antegrade bilateral vertebral arterial flow. Electronically Signed   By: Lucrezia Europe M.D.   On: 03/21/2019 16:18    Micro Results     Recent Results (from the past 240 hour(s))  SARS Coronavirus 2 (CEPHEID - Performed in [place] hospital lab), Hosp Order     Status: None   Collection Time: 03/19/19 11:09 AM   Specimen: Nasopharyngeal Swab  Result Value Ref Range Status    SARS Coronavirus 2 NEGATIVE NEGATIVE Final    Comment: (NOTE) If result is NEGATIVE SARS-CoV-2 target nucleic acids are NOT DETECTED. The SARS-CoV-2 RNA is generally detectable in upper and lower  respiratory specimens during the acute phase of infection. The lowest  concentration of SARS-CoV-2 viral copies this assay can detect is 250  copies / mL. A negative result does not preclude SARS-CoV-2 infection  and should not be used as the sole basis for treatment or other  patient management decisions.  A negative result may occur with  improper specimen collection / handling, submission of specimen other  than nasopharyngeal swab, presence of viral mutation(s) within the  areas targeted by this assay, and inadequate number of viral copies  (<250 copies / mL). A negative result must be combined with clinical  observations, patient history, and epidemiological information. If result is POSITIVE SARS-CoV-2 target nucleic acids are DETECTED. The SARS-CoV-2 RNA is generally detectable in upper and lower  respiratory specimens dur ing the acute phase of infection.  Positive  results are indicative of active infection with SARS-CoV-2.  Clinical  correlation with patient history and other diagnostic information is  necessary to determine patient infection status.  Positive results do  not rule out bacterial infection or co-infection with other viruses. If result is PRESUMPTIVE POSTIVE SARS-CoV-2 nucleic acids MAY BE PRESENT.   A presumptive positive result was obtained on the submitted specimen  and confirmed on repeat testing.  While 2019 novel coronavirus  (SARS-CoV-2) nucleic acids may be present in the submitted sample  additional confirmatory testing may be necessary for epidemiological  and / or clinical management purposes  to differentiate between  SARS-CoV-2 and other Sarbecovirus currently known to infect humans.  If clinically indicated additional testing with an alternate test   methodology [id number]) is advised. The SARS-CoV-2 RNA is generally  detectable in upper and lower respiratory sp ecimens during the acute  phase of infection. The expected result is Negative.  Fact Sheet for Patients:  StrictlyIdeas.no Fact Sheet for Healthcare Providers: BankingDealers.co.za This test is not yet approved or cleared by the [place] FDA and has been authorized for detection and/or diagnosis of SARS-CoV-2 by FDA under an Emergency Use Authorization (EUA).  This EUA will remain in effect (meaning this test can be used) for the duration of the COVID-19 declaration under Section 564(b)(1) of the Act, 21 U.S.C. section 360bbb-3(b)(1), unless the authorization is terminated or revoked sooner. Performed at [facility], [address] [place], [place] [address]   MRSA PCR Screening     Status: None   Collection Time: 03/23/19 12:03 PM   Specimen: Nasopharyngeal  Result Value Ref Range Status   MRSA by PCR NEGATIVE NEGATIVE Final    Comment:        The GeneXpert MRSA Assay (FDA approved for NASAL specimens only), is one component of a comprehensive MRSA colonization surveillance program. It is not intended to diagnose MRSA infection nor to guide or monitor treatment for MRSA infections. Performed at [facility], [address] [place], [place] [address]        Today   Subjective:   Mathew Baker today has no headache,no chest abdominal pain,no new weakness tingling or numbness, feels much better wants to go home today.  Refused to go toCIR as recommended by physical therapy.  Objective:   Blood pressure 120/63, pulse 71, temperature 98.4 F (36.9 C), temperature source Oral, resp. rate 19, height 6\' 1"  (1.854 m), weight 98.7 kg, SpO2 99 %.   Intake/Output Summary (Last 24 hours) at 03/28/2019 1039 Last data filed at 03/27/2019 1344 Gross per 24 hour  Intake 240 ml  Output -  Net  240 ml    Exam Awake Alert, Oriented x 3, No new F.N deficits, Normal affect [place].AT,PERRAL Supple Neck,No JVD, No cervical lymphadenopathy appriciated.  Symmetrical Chest wall movement, Good air movement bilaterally, CTAB RRR,No Gallops,Rubs or new Murmurs, No Parasternal Heave +ve B.Sounds, Abd Soft, Non tender, No organomegaly appriciated, No rebound -guarding or rigidity. No Cyanosis, Clubbing or edema, No new Rash or bruise  Data Review   CBC w Diff:  Lab Results  Component Value Date   WBC 10.1 03/25/2019   HGB 13.2 03/25/2019   HCT 38.6 (L) 03/25/2019   PLT 224 03/25/2019   LYMPHOPCT 14 03/19/2019   MONOPCT 7 03/19/2019   EOSPCT 2 03/19/2019   BASOPCT 0 03/19/2019    CMP:  Lab Results  Component Value Date   NA 136 03/25/2019   NA 136 (A) 07/15/2014   K 4.2 03/25/2019   CL 102 03/25/2019   CO2 26 03/25/2019   BUN 19 03/25/2019   BUN 19 07/15/2014   CREATININE 0.84 03/25/2019   CREATININE 0.94 08/22/2015   GLU 162 07/15/2014   PROT 7.5 03/19/2019   ALBUMIN 3.4 (L) 03/24/2019   BILITOT 1.0 03/19/2019   ALKPHOS 103 03/19/2019   AST 22 03/19/2019   ALT 21 03/19/2019  .   Total Time in preparing paper work, data evaluation and todays exam - 35 minutes  Epifanio Lesches M.D on 03/27/2019 at 10:39 AM    Note: This dictation was prepared with Dragon dictation along with smaller phrase technology. Any transcriptional errors that result from this process are unintentional.

## 2019-03-30 DIAGNOSIS — I4891 Unspecified atrial fibrillation: Secondary | ICD-10-CM | POA: Diagnosis not present

## 2019-03-30 DIAGNOSIS — R001 Bradycardia, unspecified: Secondary | ICD-10-CM | POA: Diagnosis not present

## 2019-03-30 DIAGNOSIS — J449 Chronic obstructive pulmonary disease, unspecified: Secondary | ICD-10-CM | POA: Diagnosis not present

## 2019-03-30 DIAGNOSIS — I10 Essential (primary) hypertension: Secondary | ICD-10-CM | POA: Diagnosis not present

## 2019-03-30 DIAGNOSIS — M5136 Other intervertebral disc degeneration, lumbar region: Secondary | ICD-10-CM | POA: Diagnosis not present

## 2019-03-30 DIAGNOSIS — E119 Type 2 diabetes mellitus without complications: Secondary | ICD-10-CM | POA: Diagnosis not present

## 2019-04-02 DIAGNOSIS — R001 Bradycardia, unspecified: Secondary | ICD-10-CM | POA: Diagnosis not present

## 2019-04-02 DIAGNOSIS — E119 Type 2 diabetes mellitus without complications: Secondary | ICD-10-CM | POA: Diagnosis not present

## 2019-04-02 DIAGNOSIS — I10 Essential (primary) hypertension: Secondary | ICD-10-CM | POA: Diagnosis not present

## 2019-04-02 DIAGNOSIS — I4891 Unspecified atrial fibrillation: Secondary | ICD-10-CM | POA: Diagnosis not present

## 2019-04-02 DIAGNOSIS — J449 Chronic obstructive pulmonary disease, unspecified: Secondary | ICD-10-CM | POA: Diagnosis not present

## 2019-04-02 DIAGNOSIS — M5136 Other intervertebral disc degeneration, lumbar region: Secondary | ICD-10-CM | POA: Diagnosis not present

## 2019-04-03 DIAGNOSIS — M5136 Other intervertebral disc degeneration, lumbar region: Secondary | ICD-10-CM | POA: Diagnosis not present

## 2019-04-03 DIAGNOSIS — E119 Type 2 diabetes mellitus without complications: Secondary | ICD-10-CM | POA: Diagnosis not present

## 2019-04-03 DIAGNOSIS — I10 Essential (primary) hypertension: Secondary | ICD-10-CM | POA: Diagnosis not present

## 2019-04-03 DIAGNOSIS — R001 Bradycardia, unspecified: Secondary | ICD-10-CM | POA: Diagnosis not present

## 2019-04-03 DIAGNOSIS — J449 Chronic obstructive pulmonary disease, unspecified: Secondary | ICD-10-CM | POA: Diagnosis not present

## 2019-04-03 DIAGNOSIS — I4891 Unspecified atrial fibrillation: Secondary | ICD-10-CM | POA: Diagnosis not present

## 2019-04-04 DIAGNOSIS — I10 Essential (primary) hypertension: Secondary | ICD-10-CM | POA: Diagnosis not present

## 2019-04-04 DIAGNOSIS — M5136 Other intervertebral disc degeneration, lumbar region: Secondary | ICD-10-CM | POA: Diagnosis not present

## 2019-04-04 DIAGNOSIS — R001 Bradycardia, unspecified: Secondary | ICD-10-CM | POA: Diagnosis not present

## 2019-04-04 DIAGNOSIS — E119 Type 2 diabetes mellitus without complications: Secondary | ICD-10-CM | POA: Diagnosis not present

## 2019-04-04 DIAGNOSIS — J449 Chronic obstructive pulmonary disease, unspecified: Secondary | ICD-10-CM | POA: Diagnosis not present

## 2019-04-04 DIAGNOSIS — I4891 Unspecified atrial fibrillation: Secondary | ICD-10-CM | POA: Diagnosis not present

## 2019-04-05 DIAGNOSIS — I10 Essential (primary) hypertension: Secondary | ICD-10-CM | POA: Diagnosis not present

## 2019-04-05 DIAGNOSIS — M5136 Other intervertebral disc degeneration, lumbar region: Secondary | ICD-10-CM | POA: Diagnosis not present

## 2019-04-05 DIAGNOSIS — E119 Type 2 diabetes mellitus without complications: Secondary | ICD-10-CM | POA: Diagnosis not present

## 2019-04-05 DIAGNOSIS — R001 Bradycardia, unspecified: Secondary | ICD-10-CM | POA: Diagnosis not present

## 2019-04-05 DIAGNOSIS — I4891 Unspecified atrial fibrillation: Secondary | ICD-10-CM | POA: Diagnosis not present

## 2019-04-05 DIAGNOSIS — J449 Chronic obstructive pulmonary disease, unspecified: Secondary | ICD-10-CM | POA: Diagnosis not present

## 2019-04-06 DIAGNOSIS — R55 Syncope and collapse: Secondary | ICD-10-CM | POA: Diagnosis not present

## 2019-04-06 DIAGNOSIS — R001 Bradycardia, unspecified: Secondary | ICD-10-CM | POA: Diagnosis not present

## 2019-04-06 DIAGNOSIS — I639 Cerebral infarction, unspecified: Secondary | ICD-10-CM | POA: Diagnosis not present

## 2019-04-06 DIAGNOSIS — J449 Chronic obstructive pulmonary disease, unspecified: Secondary | ICD-10-CM | POA: Diagnosis not present

## 2019-04-06 DIAGNOSIS — E785 Hyperlipidemia, unspecified: Secondary | ICD-10-CM | POA: Diagnosis not present

## 2019-04-06 DIAGNOSIS — I4891 Unspecified atrial fibrillation: Secondary | ICD-10-CM | POA: Diagnosis not present

## 2019-04-06 DIAGNOSIS — I10 Essential (primary) hypertension: Secondary | ICD-10-CM | POA: Diagnosis not present

## 2019-04-06 DIAGNOSIS — E119 Type 2 diabetes mellitus without complications: Secondary | ICD-10-CM | POA: Diagnosis not present

## 2019-04-06 DIAGNOSIS — M5136 Other intervertebral disc degeneration, lumbar region: Secondary | ICD-10-CM | POA: Diagnosis not present

## 2019-04-09 DIAGNOSIS — M5136 Other intervertebral disc degeneration, lumbar region: Secondary | ICD-10-CM | POA: Diagnosis not present

## 2019-04-09 DIAGNOSIS — E119 Type 2 diabetes mellitus without complications: Secondary | ICD-10-CM | POA: Diagnosis not present

## 2019-04-09 DIAGNOSIS — J449 Chronic obstructive pulmonary disease, unspecified: Secondary | ICD-10-CM | POA: Diagnosis not present

## 2019-04-09 DIAGNOSIS — R001 Bradycardia, unspecified: Secondary | ICD-10-CM | POA: Diagnosis not present

## 2019-04-09 DIAGNOSIS — I4891 Unspecified atrial fibrillation: Secondary | ICD-10-CM | POA: Diagnosis not present

## 2019-04-09 DIAGNOSIS — I10 Essential (primary) hypertension: Secondary | ICD-10-CM | POA: Diagnosis not present

## 2019-04-10 DIAGNOSIS — I10 Essential (primary) hypertension: Secondary | ICD-10-CM | POA: Diagnosis not present

## 2019-04-10 DIAGNOSIS — I4891 Unspecified atrial fibrillation: Secondary | ICD-10-CM | POA: Diagnosis not present

## 2019-04-10 DIAGNOSIS — J449 Chronic obstructive pulmonary disease, unspecified: Secondary | ICD-10-CM | POA: Diagnosis not present

## 2019-04-10 DIAGNOSIS — E119 Type 2 diabetes mellitus without complications: Secondary | ICD-10-CM | POA: Diagnosis not present

## 2019-04-10 DIAGNOSIS — M5136 Other intervertebral disc degeneration, lumbar region: Secondary | ICD-10-CM | POA: Diagnosis not present

## 2019-04-10 DIAGNOSIS — R001 Bradycardia, unspecified: Secondary | ICD-10-CM | POA: Diagnosis not present

## 2019-04-12 DIAGNOSIS — I4891 Unspecified atrial fibrillation: Secondary | ICD-10-CM | POA: Diagnosis not present

## 2019-04-12 DIAGNOSIS — E119 Type 2 diabetes mellitus without complications: Secondary | ICD-10-CM | POA: Diagnosis not present

## 2019-04-12 DIAGNOSIS — I10 Essential (primary) hypertension: Secondary | ICD-10-CM | POA: Diagnosis not present

## 2019-04-12 DIAGNOSIS — J449 Chronic obstructive pulmonary disease, unspecified: Secondary | ICD-10-CM | POA: Diagnosis not present

## 2019-04-12 DIAGNOSIS — M5136 Other intervertebral disc degeneration, lumbar region: Secondary | ICD-10-CM | POA: Diagnosis not present

## 2019-04-12 DIAGNOSIS — R001 Bradycardia, unspecified: Secondary | ICD-10-CM | POA: Diagnosis not present

## 2019-04-15 ENCOUNTER — Encounter: Payer: Self-pay | Admitting: Emergency Medicine

## 2019-04-15 ENCOUNTER — Emergency Department
Admission: EM | Admit: 2019-04-15 | Discharge: 2019-04-15 | Disposition: A | Discharge status: Home / Self Care | Payer: Medicare Other | Attending: Student | Admitting: Student

## 2019-04-15 ENCOUNTER — Other Ambulatory Visit: Payer: Self-pay

## 2019-04-15 ENCOUNTER — Emergency Department: Payer: Medicare Other

## 2019-04-15 DIAGNOSIS — Z794 Long term (current) use of insulin: Secondary | ICD-10-CM | POA: Insufficient documentation

## 2019-04-15 DIAGNOSIS — E119 Type 2 diabetes mellitus without complications: Secondary | ICD-10-CM | POA: Diagnosis not present

## 2019-04-15 DIAGNOSIS — R42 Dizziness and giddiness: Secondary | ICD-10-CM

## 2019-04-15 DIAGNOSIS — Z87891 Personal history of nicotine dependence: Secondary | ICD-10-CM | POA: Diagnosis not present

## 2019-04-15 DIAGNOSIS — I6782 Cerebral ischemia: Secondary | ICD-10-CM | POA: Diagnosis not present

## 2019-04-15 DIAGNOSIS — N390 Urinary tract infection, site not specified: Secondary | ICD-10-CM | POA: Diagnosis not present

## 2019-04-15 DIAGNOSIS — I10 Essential (primary) hypertension: Secondary | ICD-10-CM | POA: Insufficient documentation

## 2019-04-15 DIAGNOSIS — Z79899 Other long term (current) drug therapy: Secondary | ICD-10-CM | POA: Insufficient documentation

## 2019-04-15 DIAGNOSIS — R8290 Unspecified abnormal findings in urine: Secondary | ICD-10-CM | POA: Diagnosis not present

## 2019-04-15 DIAGNOSIS — I639 Cerebral infarction, unspecified: Secondary | ICD-10-CM | POA: Diagnosis not present

## 2019-04-15 LAB — COMPREHENSIVE METABOLIC PANEL
ALT: 35 U/L (ref 0–44)
AST: 31 U/L (ref 15–41)
Albumin: 3.8 g/dL (ref 3.5–5.0)
Alkaline Phosphatase: 81 U/L (ref 38–126)
Anion gap: 10 (ref 5–15)
BUN: 15 mg/dL (ref 8–23)
CO2: 25 mmol/L (ref 22–32)
Calcium: 9.2 mg/dL (ref 8.9–10.3)
Chloride: 105 mmol/L (ref 98–111)
Creatinine, Ser: 0.7 mg/dL (ref 0.61–1.24)
GFR calc Af Amer: >60 mL/min (ref >60)
GFR calc non Af Amer: >60 mL/min (ref >60)
Glucose, Bld: 92 mg/dL (ref 70–99)
Potassium: 3.9 mmol/L (ref 3.5–5.1)
Sodium: 140 mmol/L (ref 135–145)
Total Bilirubin: 1 mg/dL (ref 0.3–1.2)
Total Protein: 7.2 g/dL (ref 6.5–8.1)

## 2019-04-15 LAB — DIFFERENTIAL
Abs Immature Granulocytes: 0.04 10*3/uL (ref 0.00–0.07)
Basophils Absolute: 0.1 10*3/uL (ref 0.0–0.1)
Basophils Relative: 1 %
Eosinophils Absolute: 0.3 10*3/uL (ref 0.0–0.5)
Eosinophils Relative: 3 %
Immature Granulocytes: 0 %
Lymphocytes Relative: 24 %
Lymphs Abs: 2.3 10*3/uL (ref 0.7–4.0)
Monocytes Absolute: 0.7 10*3/uL (ref 0.1–1.0)
Monocytes Relative: 8 %
Neutro Abs: 6.1 10*3/uL (ref 1.7–7.7)
Neutrophils Relative %: 64 %

## 2019-04-15 LAB — URINALYSIS, COMPLETE (UACMP) WITH MICROSCOPIC
Bilirubin Urine: NEGATIVE
Glucose, UA: NEGATIVE mg/dL
Hgb urine dipstick: NEGATIVE
Ketones, ur: NEGATIVE mg/dL
Nitrite: NEGATIVE
Protein, ur: 30 mg/dL — AB
Specific Gravity, Urine: 1.011 (ref 1.005–1.030)
WBC, UA: >50 WBC/hpf — ABNORMAL HIGH (ref 0–5)
pH: 8 (ref 5.0–8.0)

## 2019-04-15 LAB — CBC
HCT: 40.5 % (ref 39.0–52.0)
Hemoglobin: 13.8 g/dL (ref 13.0–17.0)
MCH: 32.8 pg (ref 26.0–34.0)
MCHC: 34.1 g/dL (ref 30.0–36.0)
MCV: 96.2 fL (ref 80.0–100.0)
Platelets: 224 10*3/uL (ref 150–400)
RBC: 4.21 MIL/uL — ABNORMAL LOW (ref 4.22–5.81)
RDW: 12.6 % (ref 11.5–15.5)
WBC: 9.5 10*3/uL (ref 4.0–10.5)
nRBC: 0 % (ref 0.0–0.2)

## 2019-04-15 LAB — APTT: aPTT: 25 seconds (ref 24–36)

## 2019-04-15 LAB — TROPONIN I (HIGH SENSITIVITY)
Troponin I (High Sensitivity): 9 ng/L (ref <18)
Troponin I (High Sensitivity): 8 ng/L (ref <18)

## 2019-04-15 LAB — PROTIME-INR
INR: 1 (ref 0.8–1.2)
Prothrombin Time: 12.7 seconds (ref 11.4–15.2)

## 2019-04-15 MED ORDER — MECLIZINE HCL 25 MG PO TABS
12.5000 mg | ORAL_TABLET | Freq: Once | ORAL | Status: AC
  Administered 2019-04-15: 12.5 mg via ORAL
  Filled 2019-04-15: qty 1

## 2019-04-15 MED ORDER — SODIUM CHLORIDE 0.9 % IV SOLN
1.0000 g | Freq: Once | INTRAVENOUS | Status: AC
  Administered 2019-04-15: 1 g via INTRAVENOUS
  Filled 2019-04-15: qty 10

## 2019-04-15 MED ORDER — CEPHALEXIN 500 MG PO CAPS: 500.0000 mg | ORAL_CAPSULE | Freq: Two times a day (BID) | ORAL | 0 refills | Status: AC

## 2019-04-15 MED ORDER — SODIUM CHLORIDE 0.9% FLUSH: 3.0000 mL | Freq: Once | INTRAVENOUS | Status: DC

## 2019-04-15 MED ORDER — MECLIZINE HCL 25 MG PO TABS: 25.0000 mg | ORAL_TABLET | Freq: Three times a day (TID) | ORAL | 0 refills | Status: DC | PRN

## 2019-04-15 NOTE — ED Notes (Signed)
EDP at bedside  

## 2019-04-15 NOTE — ED Provider Notes (Signed)
-----------------------------------------   3:55 PM on 04/15/2019 -----------------------------------------  Patient care assumed from Dr. Joan Mayans.  Overall the patient appears very well, patient's urinalysis has resulted consistent with a urinary tract infection with white blood cell clumps.  We will dose IV Rocephin.  We will discharge with Keflex as well as a prescription for meclizine which the patient states he feels like has helped him as well.  We will have the patient follow-up with his doctor.  I discussed return precautions.  Patient agreeable to plan of care.   Harvest Dark, MD 04/15/19 1556

## 2019-04-15 NOTE — ED Notes (Addendum)
Pt arrives to ED for dizziness, feeling off balance x a while. C/o R hand numbness. States more difficult to raise R arm but is able to do so and without drift. No facial droop noted. No slurred speech. No vision problems. States TIA in July. Pt states hx vertigo. Hx DM, CBG's have been good at home. Pt states he has been eating/drinking well. Pt is also c/o of N&V as well. No distress noted at this time. Able to move all extremities on own.

## 2019-04-15 NOTE — ED Notes (Signed)
Wife stated she was leaving. Informed that she will not be allowed to come back in d/t policy. Gave wife ER number for her to call to check on husband.

## 2019-04-15 NOTE — ED Provider Notes (Signed)
[facility] Emergency Department Provider Note  ____________________________________________   First MD Initiated Contact with Patient 04/15/19 1014     (approximate)  I have reviewed the triage vital signs and the nursing notes.  History  Chief Complaint Dizziness and Emesis    HPI Mathew Baker is a 78 y.o. male with a history of hypertension, hyperlipidemia, diabetes, recent stroke, bradycardia, who presents to the emergency department for dizziness and vomiting for 3 days.  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  He states since discharge he has had persistent dizziness, but it has felt worse over the last 3 days.  He reports dizziness seems most prominent when going from sitting to standing.  Results in nausea and vomiting.  At rest he feels improved.  He denies any associated chest pain, shortness of breath, palpitations or difficulty breathing.  He does report recent malodorous urine.  No fevers.  He denies any new neurological symptoms, including no new weakness, numbness, tingling.         Past Medical Hx Past Medical History:  Diagnosis Date  . Chronic airway obstruction ([place])   . Degeneration of intervertebral disc of lumbar region   . Diabetes mellitus without complication ([place])   . Essential hypertension, benign   . Hyperlipidemia   . Hypertension   . Osteoarthritis of lower extremity   . Pneumonia, organism unspecified(486)   . PSA elevation 07/16/2013   Normal    Problem List Patient Active Problem List   Diagnosis Date Noted  . Bradycardia 03/19/2019  . Chest pain 12/20/2016  . HTN (hypertension) 12/20/2016  . HLD (hyperlipidemia) 12/20/2016  . Syncope and collapse 12/20/2016  . 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 CATARACT  EXTRACTION W/ INTRAOCULAR LENS  IMPLANT, BILATERAL    . COLONOSCOPY  2006   Normal: Repeat in 10 yrs    Medications Prior to Admission medications   Medication Sig Start Date End Date Taking? Authorizing Provider  albuterol (PROVENTIL HFA;VENTOLIN HFA) 108 (90 Base) MCG/ACT inhaler Inhale 2 puffs into the lungs 4 (four) times daily as needed for wheezing or shortness of breath.    [provider]  amLODipine (NORVASC) 10 MG tablet Take 10 mg by mouth daily.    [provider]  aspirin 325 MG tablet Take 1 tablet (325 mg total) by mouth daily. 03/26/19   Epifanio Lesches, MD  atorvastatin (LIPITOR) 80 MG tablet Take 40 mg by mouth at bedtime.    [provider]  diclofenac sodium (VOLTAREN) 1 % GEL Apply 1 application topically daily as needed (shoulder pain). 03/25/19   Epifanio Lesches, MD  Dulaglutide (TRULICITY) 1.5 FI/4.3PI SOPN Inject 1.5 mg into the skin every 7 (seven) days.    [provider]  glucose blood (PRECISION XTRA TEST STRIPS) test strip Use to test blood sugar 2 times daily as instructed. Dx: E11.65 08/22/15   Philemon Kingdom, MD  HYDROcodone-acetaminophen (NORCO/VICODIN) 5-325 MG tablet Take 1 tablet by mouth 2 (two) times a day.     [provider]  insulin aspart (NOVOLOG) 100 UNIT/ML injection Inject 0-9 Units into the skin 3 (three) times daily with meals. 03/25/19   Epifanio Lesches, MD  Insulin Pen Needle (CAREFINE PEN NEEDLES) 32G X 4 MM MISC Use 1x a day 08/21/14   Philemon Kingdom, MD  latanoprost (XALATAN) 0.005 %  ophthalmic solution Place 1 drop into both eyes at bedtime.    [provider]  lisinopril (ZESTRIL) 40 MG tablet Take 40 mg by mouth daily.    [provider]  Magnesium Oxide 420 MG TABS Take 420 mg by mouth daily.     [provider]  sildenafil (VIAGRA) 100 MG tablet Take 100 mg by mouth daily as needed for erectile dysfunction.    [provider]  tamsulosin  (FLOMAX) 0.4 MG CAPS capsule Take 0.4 mg by mouth at bedtime.    [provider]  tiotropium (SPIRIVA) 18 MCG inhalation capsule Place 18 mcg into inhaler and inhale daily.     [provider]    Allergies Patient has no known allergies.  Family Hx Family History  Problem Relation Age of Onset  . Diabetes Mother     Social Hx Social History   Tobacco Use  . Smoking status: Former Smoker    Quit date: 09/06/1958    Years since quitting: 60.6  . Smokeless tobacco: Never Used  Substance Use Topics  . Alcohol use: No    Alcohol/week: 0.0 standard drinks  . Drug use: No     Review of Systems  Constitutional: Negative for fever. Negative for chills. Eyes: Negative for visual changes. ENT: Negative for sore throat. Cardiovascular: Negative for chest pain. Respiratory: Negative for shortness of breath. Gastrointestinal: + vomiting Genitourinary: + for malodorous urine Musculoskeletal: Negative for leg swelling. Skin: Negative for rash. Neurological: + dizziness   Physical Exam  Vital Signs: ED Triage Vitals  Enc Vitals Group     BP 04/15/19 0938 136/77     Pulse Rate 04/15/19 0938 66     Resp 04/15/19 0938 16     Temp 04/15/19 0938 98.1 F (36.7 C)     Temp Source 04/15/19 0938 Oral     SpO2 04/15/19 0938 100 %     Weight 04/15/19 0939 220 lb (99.8 kg)     Height 04/15/19 0939 6\' 1"  (1.854 m)     Head Circumference --      Peak Flow --      Pain Score 04/15/19 0939 0     Pain Loc --      Pain Edu? --      Excl. in [place]? --     Constitutional: Alert and oriented.  Eyes: Conjunctivae clear. Sclera anicteric. No nystagmus. Head: Normocephalic. Atraumatic. Nose: No congestion. No rhinorrhea. Mouth/Throat: Mucous membranes are moist.  Neck: No stridor.   Cardiovascular: Normal rate, regular rhythm.  Soft systolic murmur. Extremities well perfused. Respiratory: Normal respiratory effort.  Lungs CTAB. Gastrointestinal: Soft and non-tender. No  distention.  Musculoskeletal: No lower extremity edema. Neurologic:  Normal speech and language. No gross focal neurologic deficits are appreciated. Alert and oriented.  Face symmetric.  Tongue midline.  Cranial nerves II through XII intact. UE and LE strength 5/5 and symmetric. UE and LE SILT.  Intact finger-to-nose. Skin: Skin is warm, dry and intact. No rash noted. Psychiatric: Mood and affect are appropriate for situation.  EKG  Personally reviewed.   Rate: 85 Rhythm: sinus Axis: left axis Intervals: within normal limits No acute ischemic changes.    Radiology  CT head: IMPRESSION: 1. Expected interval evolution of subacute inferior bilateral paramedian cerebellar infarct, with largely resolved associated mass-effect. 2. No evidence of acute interval intracranial abnormality. No hydrocephalus. 3. Mild chronic small vessel ischemic changes in the cerebral white matter.   Procedures  Procedure(s) performed (including critical  care):  Procedures   Initial Impression / Assessment and Plan / ED Course  78 y.o. male  with a history of hypertension, hyperlipidemia, diabetes, recent stroke, bradycardia, who presents to the emergency department for dizziness and vomiting for 3 days. 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   On exam is well appearing. No focal neurological deficits. Normal finger to nose w/o ataxia. No nystagmus.   Ddx includes new stroke or complication from recent stroke vs underlying abnormality (such as electrolyte disturbance or infection) contributing to recrudescence of symptoms.  Echocardiogram done during admission revealed only mild stenosis of aortic valve, unlikely to be symptomatic AS.  Also had bilateral carotid ultrasounds during his admission with no significant stenosis.  We will plan for labs, CT imaging, urine.  Labs without actionable derangements.  CT head with expected evolution  of his known infarct, no evidence of an acute abnormality or hydrocephalus.  3:00 PM Care transferred due to shift change.  Awaiting urinalysis for evaluation of potential UTI given his symptoms, which could be a contributing factor to recrudescence of symptoms.  Otherwise, ultimately anticipate discharge given he has undergone a complete work-up for his stroke during his recent admission, along with medical optimization.    Final Clinical Impression(s) / ED Diagnosis  Final diagnoses:  Dizziness  Malodorous urine       Note:  This document was prepared using Dragon voice recognition software and may include unintentional dictation errors.   Lilia Pro., MD 04/15/19 [phone]

## 2019-04-15 NOTE — ED Notes (Signed)
Wife at bedside.

## 2019-04-15 NOTE — ED Triage Notes (Signed)
Pt to ED via POV c/o multiple symptoms that have been going on for several weeks. Pt states that he was in the hospital in July for TIA. Pt reports that since that time he has been having issues with balance, dizziness, vomiting, and tingling in his right hand. Pt states that the tingling in his has gotten worse over the past 2-3 days. Pt is currently in NAD. No slurred speech noted at this time.

## 2019-04-16 LAB — URINE CULTURE: Culture: >=100000 — AB

## 2019-04-17 DIAGNOSIS — M5136 Other intervertebral disc degeneration, lumbar region: Secondary | ICD-10-CM | POA: Diagnosis not present

## 2019-04-17 DIAGNOSIS — R001 Bradycardia, unspecified: Secondary | ICD-10-CM | POA: Diagnosis not present

## 2019-04-17 DIAGNOSIS — J449 Chronic obstructive pulmonary disease, unspecified: Secondary | ICD-10-CM | POA: Diagnosis not present

## 2019-04-17 DIAGNOSIS — R55 Syncope and collapse: Secondary | ICD-10-CM | POA: Diagnosis not present

## 2019-04-17 DIAGNOSIS — I10 Essential (primary) hypertension: Secondary | ICD-10-CM | POA: Diagnosis not present

## 2019-04-17 DIAGNOSIS — I4891 Unspecified atrial fibrillation: Secondary | ICD-10-CM | POA: Diagnosis not present

## 2019-04-17 DIAGNOSIS — E119 Type 2 diabetes mellitus without complications: Secondary | ICD-10-CM | POA: Diagnosis not present

## 2019-04-18 DIAGNOSIS — I10 Essential (primary) hypertension: Secondary | ICD-10-CM | POA: Diagnosis not present

## 2019-04-18 DIAGNOSIS — M5136 Other intervertebral disc degeneration, lumbar region: Secondary | ICD-10-CM | POA: Diagnosis not present

## 2019-04-18 DIAGNOSIS — I4891 Unspecified atrial fibrillation: Secondary | ICD-10-CM | POA: Diagnosis not present

## 2019-04-18 DIAGNOSIS — R001 Bradycardia, unspecified: Secondary | ICD-10-CM | POA: Diagnosis not present

## 2019-04-18 DIAGNOSIS — E119 Type 2 diabetes mellitus without complications: Secondary | ICD-10-CM | POA: Diagnosis not present

## 2019-04-18 DIAGNOSIS — J449 Chronic obstructive pulmonary disease, unspecified: Secondary | ICD-10-CM | POA: Diagnosis not present

## 2019-04-24 DIAGNOSIS — R001 Bradycardia, unspecified: Secondary | ICD-10-CM | POA: Diagnosis not present

## 2019-04-24 DIAGNOSIS — M5136 Other intervertebral disc degeneration, lumbar region: Secondary | ICD-10-CM | POA: Diagnosis not present

## 2019-04-24 DIAGNOSIS — I10 Essential (primary) hypertension: Secondary | ICD-10-CM | POA: Diagnosis not present

## 2019-04-24 DIAGNOSIS — E119 Type 2 diabetes mellitus without complications: Secondary | ICD-10-CM | POA: Diagnosis not present

## 2019-04-24 DIAGNOSIS — I4891 Unspecified atrial fibrillation: Secondary | ICD-10-CM | POA: Diagnosis not present

## 2019-04-24 DIAGNOSIS — J449 Chronic obstructive pulmonary disease, unspecified: Secondary | ICD-10-CM | POA: Diagnosis not present

## 2019-04-25 DIAGNOSIS — E119 Type 2 diabetes mellitus without complications: Secondary | ICD-10-CM | POA: Diagnosis not present

## 2019-04-25 DIAGNOSIS — M5136 Other intervertebral disc degeneration, lumbar region: Secondary | ICD-10-CM | POA: Diagnosis not present

## 2019-04-25 DIAGNOSIS — I10 Essential (primary) hypertension: Secondary | ICD-10-CM | POA: Diagnosis not present

## 2019-04-25 DIAGNOSIS — J449 Chronic obstructive pulmonary disease, unspecified: Secondary | ICD-10-CM | POA: Diagnosis not present

## 2019-04-25 DIAGNOSIS — R001 Bradycardia, unspecified: Secondary | ICD-10-CM | POA: Diagnosis not present

## 2019-04-25 DIAGNOSIS — I4891 Unspecified atrial fibrillation: Secondary | ICD-10-CM | POA: Diagnosis not present

## 2019-04-27 DIAGNOSIS — M5136 Other intervertebral disc degeneration, lumbar region: Secondary | ICD-10-CM | POA: Diagnosis not present

## 2019-04-27 DIAGNOSIS — R001 Bradycardia, unspecified: Secondary | ICD-10-CM | POA: Diagnosis not present

## 2019-04-27 DIAGNOSIS — I4891 Unspecified atrial fibrillation: Secondary | ICD-10-CM | POA: Diagnosis not present

## 2019-04-27 DIAGNOSIS — H409 Unspecified glaucoma: Secondary | ICD-10-CM | POA: Diagnosis not present

## 2019-04-27 DIAGNOSIS — E785 Hyperlipidemia, unspecified: Secondary | ICD-10-CM | POA: Diagnosis not present

## 2019-04-27 DIAGNOSIS — Z9181 History of falling: Secondary | ICD-10-CM | POA: Diagnosis not present

## 2019-04-27 DIAGNOSIS — Z794 Long term (current) use of insulin: Secondary | ICD-10-CM | POA: Diagnosis not present

## 2019-04-27 DIAGNOSIS — M1710 Unilateral primary osteoarthritis, unspecified knee: Secondary | ICD-10-CM | POA: Diagnosis not present

## 2019-04-27 DIAGNOSIS — Z87891 Personal history of nicotine dependence: Secondary | ICD-10-CM | POA: Diagnosis not present

## 2019-04-27 DIAGNOSIS — Z7982 Long term (current) use of aspirin: Secondary | ICD-10-CM | POA: Diagnosis not present

## 2019-04-27 DIAGNOSIS — E119 Type 2 diabetes mellitus without complications: Secondary | ICD-10-CM | POA: Diagnosis not present

## 2019-04-27 DIAGNOSIS — Z8673 Personal history of transient ischemic attack (TIA), and cerebral infarction without residual deficits: Secondary | ICD-10-CM | POA: Diagnosis not present

## 2019-04-27 DIAGNOSIS — Z8701 Personal history of pneumonia (recurrent): Secondary | ICD-10-CM | POA: Diagnosis not present

## 2019-04-27 DIAGNOSIS — I10 Essential (primary) hypertension: Secondary | ICD-10-CM | POA: Diagnosis not present

## 2019-04-27 DIAGNOSIS — J449 Chronic obstructive pulmonary disease, unspecified: Secondary | ICD-10-CM | POA: Diagnosis not present

## 2019-05-01 DIAGNOSIS — R001 Bradycardia, unspecified: Secondary | ICD-10-CM | POA: Diagnosis not present

## 2019-05-01 DIAGNOSIS — I10 Essential (primary) hypertension: Secondary | ICD-10-CM | POA: Diagnosis not present

## 2019-05-01 DIAGNOSIS — M5136 Other intervertebral disc degeneration, lumbar region: Secondary | ICD-10-CM | POA: Diagnosis not present

## 2019-05-01 DIAGNOSIS — J449 Chronic obstructive pulmonary disease, unspecified: Secondary | ICD-10-CM | POA: Diagnosis not present

## 2019-05-01 DIAGNOSIS — E119 Type 2 diabetes mellitus without complications: Secondary | ICD-10-CM | POA: Diagnosis not present

## 2019-05-01 DIAGNOSIS — I4891 Unspecified atrial fibrillation: Secondary | ICD-10-CM | POA: Diagnosis not present

## 2019-05-02 DIAGNOSIS — Z8673 Personal history of transient ischemic attack (TIA), and cerebral infarction without residual deficits: Secondary | ICD-10-CM | POA: Diagnosis not present

## 2019-05-02 DIAGNOSIS — I10 Essential (primary) hypertension: Secondary | ICD-10-CM | POA: Diagnosis not present

## 2019-05-02 DIAGNOSIS — E785 Hyperlipidemia, unspecified: Secondary | ICD-10-CM | POA: Diagnosis not present

## 2019-05-02 DIAGNOSIS — R001 Bradycardia, unspecified: Secondary | ICD-10-CM | POA: Diagnosis not present

## 2019-05-02 DIAGNOSIS — I350 Nonrheumatic aortic (valve) stenosis: Secondary | ICD-10-CM | POA: Diagnosis not present

## 2019-05-04 DIAGNOSIS — I4891 Unspecified atrial fibrillation: Secondary | ICD-10-CM | POA: Diagnosis not present

## 2019-05-04 DIAGNOSIS — I10 Essential (primary) hypertension: Secondary | ICD-10-CM | POA: Diagnosis not present

## 2019-05-04 DIAGNOSIS — E119 Type 2 diabetes mellitus without complications: Secondary | ICD-10-CM | POA: Diagnosis not present

## 2019-05-04 DIAGNOSIS — M5136 Other intervertebral disc degeneration, lumbar region: Secondary | ICD-10-CM | POA: Diagnosis not present

## 2019-05-04 DIAGNOSIS — J449 Chronic obstructive pulmonary disease, unspecified: Secondary | ICD-10-CM | POA: Diagnosis not present

## 2019-05-04 DIAGNOSIS — R001 Bradycardia, unspecified: Secondary | ICD-10-CM | POA: Diagnosis not present

## 2019-05-07 DIAGNOSIS — J449 Chronic obstructive pulmonary disease, unspecified: Secondary | ICD-10-CM | POA: Diagnosis not present

## 2019-05-07 DIAGNOSIS — I10 Essential (primary) hypertension: Secondary | ICD-10-CM | POA: Diagnosis not present

## 2019-05-07 DIAGNOSIS — M5136 Other intervertebral disc degeneration, lumbar region: Secondary | ICD-10-CM | POA: Diagnosis not present

## 2019-05-07 DIAGNOSIS — R001 Bradycardia, unspecified: Secondary | ICD-10-CM | POA: Diagnosis not present

## 2019-05-07 DIAGNOSIS — E119 Type 2 diabetes mellitus without complications: Secondary | ICD-10-CM | POA: Diagnosis not present

## 2019-05-07 DIAGNOSIS — I4891 Unspecified atrial fibrillation: Secondary | ICD-10-CM | POA: Diagnosis not present

## 2019-06-07 DIAGNOSIS — I10 Essential (primary) hypertension: Secondary | ICD-10-CM | POA: Diagnosis not present

## 2019-06-07 DIAGNOSIS — I779 Disorder of arteries and arterioles, unspecified: Secondary | ICD-10-CM | POA: Insufficient documentation

## 2019-06-07 DIAGNOSIS — R809 Proteinuria, unspecified: Secondary | ICD-10-CM | POA: Diagnosis not present

## 2019-06-07 DIAGNOSIS — Z9989 Dependence on other enabling machines and devices: Secondary | ICD-10-CM | POA: Diagnosis not present

## 2019-06-07 DIAGNOSIS — J449 Chronic obstructive pulmonary disease, unspecified: Secondary | ICD-10-CM | POA: Diagnosis not present

## 2019-06-07 DIAGNOSIS — Z0000 Encounter for general adult medical examination without abnormal findings: Secondary | ICD-10-CM | POA: Diagnosis not present

## 2019-06-07 DIAGNOSIS — E1129 Type 2 diabetes mellitus with other diabetic kidney complication: Secondary | ICD-10-CM | POA: Diagnosis not present

## 2019-06-07 DIAGNOSIS — Z23 Encounter for immunization: Secondary | ICD-10-CM | POA: Diagnosis not present

## 2019-06-07 DIAGNOSIS — E1165 Type 2 diabetes mellitus with hyperglycemia: Secondary | ICD-10-CM | POA: Diagnosis not present

## 2019-06-22 DIAGNOSIS — Z8673 Personal history of transient ischemic attack (TIA), and cerebral infarction without residual deficits: Secondary | ICD-10-CM | POA: Diagnosis not present

## 2019-06-22 DIAGNOSIS — R2689 Other abnormalities of gait and mobility: Secondary | ICD-10-CM | POA: Diagnosis not present

## 2019-06-25 ENCOUNTER — Other Ambulatory Visit: Payer: Self-pay | Admitting: Neurology

## 2019-06-25 ENCOUNTER — Other Ambulatory Visit (HOSPITAL_COMMUNITY): Payer: Self-pay | Admitting: Neurology

## 2019-06-25 DIAGNOSIS — R2689 Other abnormalities of gait and mobility: Secondary | ICD-10-CM

## 2019-06-25 DIAGNOSIS — I6930 Unspecified sequelae of cerebral infarction: Secondary | ICD-10-CM

## 2019-06-25 DIAGNOSIS — Z8673 Personal history of transient ischemic attack (TIA), and cerebral infarction without residual deficits: Secondary | ICD-10-CM

## 2019-07-02 ENCOUNTER — Ambulatory Visit: Admission: RE | Admit: 2019-07-02 | Payer: Medicare Other | Source: Ambulatory Visit

## 2019-07-11 ENCOUNTER — Ambulatory Visit
Admission: RE | Admit: 2019-07-11 | Discharge: 2019-07-11 | Disposition: A | Discharge status: Home / Self Care | Payer: Medicare Other | Source: Ambulatory Visit | Attending: Neurology | Admitting: Neurology

## 2019-07-11 ENCOUNTER — Other Ambulatory Visit: Payer: Self-pay

## 2019-07-11 DIAGNOSIS — R2689 Other abnormalities of gait and mobility: Secondary | ICD-10-CM | POA: Diagnosis not present

## 2019-07-11 DIAGNOSIS — I6930 Unspecified sequelae of cerebral infarction: Secondary | ICD-10-CM | POA: Insufficient documentation

## 2019-07-11 DIAGNOSIS — G894 Chronic pain syndrome: Secondary | ICD-10-CM | POA: Diagnosis not present

## 2019-07-11 DIAGNOSIS — I639 Cerebral infarction, unspecified: Secondary | ICD-10-CM | POA: Diagnosis not present

## 2019-07-11 DIAGNOSIS — Z8673 Personal history of transient ischemic attack (TIA), and cerebral infarction without residual deficits: Secondary | ICD-10-CM | POA: Diagnosis not present

## 2019-07-11 DIAGNOSIS — W132XXD Fall from, out of or through roof, subsequent encounter: Secondary | ICD-10-CM | POA: Diagnosis not present

## 2019-07-11 DIAGNOSIS — M5489 Other dorsalgia: Secondary | ICD-10-CM | POA: Diagnosis not present

## 2019-07-11 MED ORDER — IOHEXOL 300 MG/ML  SOLN
75.0000 mL | Freq: Once | INTRAMUSCULAR | Status: AC | PRN
  Administered 2019-07-11: 13:00:00 75 mL via INTRAVENOUS

## 2019-08-15 DIAGNOSIS — M545 Low back pain: Secondary | ICD-10-CM | POA: Diagnosis not present

## 2019-08-15 DIAGNOSIS — M5136 Other intervertebral disc degeneration, lumbar region: Secondary | ICD-10-CM | POA: Diagnosis not present

## 2019-08-15 DIAGNOSIS — M5489 Other dorsalgia: Secondary | ICD-10-CM | POA: Diagnosis not present

## 2019-08-21 ENCOUNTER — Ambulatory Visit: Payer: Medicare Other

## 2019-08-24 ENCOUNTER — Ambulatory Visit: Payer: Medicare Other

## 2019-08-28 ENCOUNTER — Ambulatory Visit: Payer: Medicare Other

## 2019-09-10 ENCOUNTER — Ambulatory Visit: Payer: Medicare Other

## 2019-09-12 ENCOUNTER — Ambulatory Visit: Payer: Medicare Other

## 2019-09-17 ENCOUNTER — Ambulatory Visit: Payer: Medicare Other

## 2019-09-18 ENCOUNTER — Ambulatory Visit: Payer: Medicare Other | Attending: Neurology

## 2019-09-18 ENCOUNTER — Other Ambulatory Visit: Payer: Self-pay

## 2019-09-18 DIAGNOSIS — R2689 Other abnormalities of gait and mobility: Secondary | ICD-10-CM | POA: Diagnosis present

## 2019-09-18 DIAGNOSIS — R2681 Unsteadiness on feet: Secondary | ICD-10-CM

## 2019-09-18 DIAGNOSIS — M6281 Muscle weakness (generalized): Secondary | ICD-10-CM | POA: Diagnosis present

## 2019-09-18 NOTE — Therapy (Signed)
[place] MAIN [facility] SERVICES [address] [place], [place], [address] Phone: [phone]   Fax:  [phone]  Physical Therapy Evaluation  Patient Details  Name: Mathew Baker MRN: [id number] Date of Birth: 03-26-1941 Referring Provider (PT): Jennings Books, MD   Encounter Date: 09/18/2019  PT End of Session - 09/18/19 1518    Visit Number  1    Number of Visits  16    Date for PT Re-Evaluation  11/13/19    Authorization Type  1/10 eval 1/12    PT Start Time  1402    PT Stop Time  1453    PT Time Calculation (min)  51 min    Equipment Utilized During Treatment  Gait belt    Activity Tolerance  Patient tolerated treatment well    Behavior During Therapy  [facility] for tasks assessed/performed       Past Medical History:  Diagnosis Date  . Chronic airway obstruction ([place])   . Degeneration of intervertebral disc of lumbar region   . Diabetes mellitus without complication ([place])   . Essential hypertension, benign   . Hyperlipidemia   . Hypertension   . Osteoarthritis of lower extremity   . Pneumonia, organism unspecified(486)   . PSA elevation 07/16/2013   Normal    Past Surgical History:  Procedure Laterality Date  . CATARACT EXTRACTION W/ INTRAOCULAR LENS  IMPLANT, BILATERAL    . COLONOSCOPY  2006   Normal: Repeat in 10 yrs    There were no vitals filed for this visit.   Subjective Assessment - 09/18/19 1406    Subjective  Patient is a pleasant 79 year old male who presents to physical therapy for imbalance s/p cerebellar stroke.    Pertinent History  Patient is a pleasant 79 year old male who presents to physical therapy for imbalance. 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 He did not receive TPA. Per previous documentation he additionally had episodes of dizziness. Was in the hospital for 13 days. Home health physical therapy was received for 4 weeks.  PMH includes DM, hx of stroke, HLD, HTN, bradycardia, syncope and collapse, COPD, Diabetic retinopathy, nonrheumatic aortic valve stenosis, CVA, CAD, DDD, and chronic pain syndrome. Patient reports 2-3 near falls a day.    Limitations  Lifting;Standing;Walking;House hold activities;Other (comment)    How long can you sit comfortably?  n/a    How long can you stand comfortably?  2 hours    How long can you walk comfortably?  needs to use stick, tries to not use walking stick.    Diagnostic tests  Imaging: MRI: acute infarction affecting cerebellar vermis and medial aspect of both inferior cerebellar hemispheres. 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    Patient Stated Goals  to get to walking right.    Currently in Pain?  No/denies          [facility] PT Assessment - 09/19/19 0001      Assessment   Medical Diagnosis  imbalance  Referring Provider (PT)  Jennings Books, MD    Onset Date/Surgical Date  04/15/19    Hand Dominance  Left    Prior Therapy  yes: hospital and HHPT      Precautions   Precautions  Fall      Restrictions   Weight Bearing Restrictions  No      Balance Screen   Has the patient fallen in the past 6 months  No   but multiple near falls a day   Has the patient had a decrease in activity level because of a fear of falling?   Yes    Is the patient reluctant to leave their home because of a fear of falling?   No      Home Engineer, building services residence    Living Arrangements  Spouse/significant other    Available Help at Discharge  Family    Type of [place] Access  Level entry    [place]  One level      Prior Function   Level of Independence  Independent    Leisure  walk, jog, fish,       Standardized Balance Assessment   Standardized Balance Assessment  Berg Balance Test      Berg Balance Test   Sit to Stand  Able to stand without using hands and stabilize independently    Standing Unsupported  Able to stand 2 minutes with supervision    Sitting with Back Unsupported but Feet Supported on Floor or Stool  Able to sit safely and securely 2 minutes    Stand to Sit  Sits safely with minimal use of hands    Transfers  Able to transfer safely, minor use of hands    Standing Unsupported with Eyes Closed  Able to stand 10 seconds with supervision    Standing Unsupported with Feet Together  Able to place feet together independently but unable to hold for 30 seconds    From Standing, Reach Forward with Outstretched Arm  Can reach forward >12 cm safely (5")    From Standing Position, Pick up Object from Floor  Able to pick up shoe safely and easily    From Standing Position, Turn to Look Behind Over each Shoulder  Looks behind one side only/other side shows less weight shift    Turn 360 Degrees  Able to turn 360 degrees safely but slowly    Standing Unsupported, Alternately Place Feet on Step/Stool  Able to stand independently and complete 8 steps >20 seconds    Standing Unsupported, One Foot in Front  Needs help to step but can hold 15 seconds    Standing on One Leg  Tries to lift leg/unable to hold 3 seconds but remains standing independently    Total Score  41         PAIN: No pain reported by patient  POSTURE: Seated: slumped positioning with preference to the left  Standing: decreased weight acceptance onto RLE, difficulty finding center of mass  Coordination/sensation No active  tone triggered against resistance bilaterally Finger to nose test: dysmetria of RUE Heel shin test: dysmetria of RLE    STRENGTH:  Graded on a 0-5 scale Muscle Group Left Right  Hip Flex 4/5 4-/5  Hip Abd 4/5 3+/5  Hip Add 4-/5 3+/5  Hip Ext (gross measurement) 4-/5 3+/5  Hip IR/ER    Knee Flex 4-/5 3/5  Knee Ext 4/5 3+/5  Ankle  DF 4/5 2+/5  Ankle PF 4/5 3/5  R hamstring and df weak  SENSATION: Initially reports no sensation differences between L and R, then reports "fuzzy feeling of RLE".   SPECIAL TESTS: Visual field screen: lack of lateral R visual field in upper, middle, and lower planes. Mild deficit   FUNCTIONAL MOBILITY: STS: able to perform without UE support , more weight acceptance/push through LLE than RLE.   BALANCE: Dynamic Sitting Balance  Normal Able to sit unsupported and weight shift across midline maximally   Good Able to sit unsupported and weight shift across midline moderately   Good-/Fair+ Able to sit unsupported and weight shift across midline minimally x  Fair Minimal weight shifting ipsilateral/front, difficulty crossing midline   Fair- Reach to ipsilateral side and unable to weight shift   Poor + Able to sit unsupported with min A and reach to ipsilateral side, unable to weight shift   Poor Able to sit unsupported with mod A and reach ipsilateral/front-can't cross midline     Standing Dynamic Balance  Normal Stand independently unsupported, able to weight shift and cross midline maximally   Good Stand independently unsupported, able to weight shift and cross midline moderately   Good-/Fair+ Stand independently unsupported, able to weight shift across midline minimally   Fair Stand independently unsupported, weight shift, and reach ipsilaterally, loss of balance when crossing midline x  Poor+ Able to stand with Min A and reach ipsilaterally, unable to weight shift   Poor Able to stand with Mod A and minimally reach ipsilaterally, unable to cross  midline.     Static Sitting Balance  Normal Able to maintain balance against maximal resistance   Good Able to maintain balance against moderate resistance   Good-/Fair+ Accepts minimal resistance   Fair Able to sit unsupported without balance loss and without UE support x  Poor+ Able to maintain with Minimal assistance from individual or chair   Poor Unable to maintain balance-requires mod/max support from individual or chair     Static Standing Balance  Normal Able to maintain standing balance against maximal resistance   Good Able to maintain standing balance against moderate resistance   Good-/Fair+ Able to maintain standing balance against minimal resistance   Fair Able to stand unsupported without UE support and without LOB for 1-2 min x  Fair- Requires Min A and UE support to maintain standing without loss of balance   Poor+ Requires mod A and UE support to maintain standing without loss of balance   Poor Requires max A and UE support to maintain standing balance without loss       GAIT: Patient ambulates with and without walking stick. When ambulating w/o AD patient demonstrates decreased weight shift onto RLE, ataxic gait mechanics with limited heel strike and toe off of R foot. Occasional weaving to L and R noted with a stagger stance when near obstacles/people/walls.   OUTCOME MEASURES: TEST Outcome Interpretation  [place] Self Score 56/100 Discharge score: 65  10 meter walk test   0.53 no AD           m/s <1.0 m/s indicates increased risk for falls; limited community ambulator  Timed up and Go     13            sec <14 sec indicates increased risk for falls  ABC 28.1%    Berg Balance Assessment 41/56  <36/56 (100% risk for falls), 37-45 (80% risk for falls); 46-51 (>50% risk for falls); 52-55 (lower risk <  25% of falls)            Objective measurements completed on examination: See above findings.     Access Code: [email or web address]  URL:  [email or web address]  Date: 09/18/2019  Prepared by: Janna Arch   Exercises Standing Hip Extension with Counter Support - 10 reps - 2 sets - 5 hold - 1x daily - 7x weekly Standing Tandem Balance with Counter Support - 2 reps - 2 sets - 30 hold - 1x daily - 7x weekly Standing March with Counter Support - 10 reps - 2 sets - 5 hold - 1x daily - 7x weekly Seated Long Arc Quad - 10 reps - 2 sets - 5 hold - 1x daily - 7x weekly Seated Hip Abduction with Resistance - 10 reps - 2 sets - 5 hold - 1x daily - 7x weekly          PT Education - 09/18/19 1518    Education Details  goals, POC, HEP    Person(s) Educated  Patient    Methods  Explanation;Demonstration;Tactile cues;Verbal cues;Handout    Comprehension  Verbalized understanding;Returned demonstration;Verbal cues required;Tactile cues required       PT Short Term Goals - 09/19/19 0730      PT SHORT TERM GOAL #1   Title  Patient will be independent in home exercise program to improve strength/mobility for better functional independence with ADLs.    Baseline  1/13: HEP given    Time  2    Period  Weeks    Status  New    Target Date  10/03/19        PT Long Term Goals - 09/19/19 0731      PT LONG TERM GOAL #1   Title  Patient will increase 10 meter walk test to >1.60m/s as to improve gait speed for better community ambulation and to reduce fall risk.    Baseline  09/19/19: 0.53 no AD    Time  8    Period  Weeks    Status  New    Target Date  11/13/19      PT LONG TERM GOAL #2   Title  Patient will increase Berg Balance score by > 6 points (47/56) to demonstrate decreased fall risk during functional activities.    Baseline  1/13: 41/56    Time  8    Period  Weeks    Status  New    Target Date  11/13/19      PT LONG TERM GOAL #3   Title  Patient will increase ABC scale score >80% to demonstrate better functional mobility and better confidence with ADLs.    Baseline  1/13: 28.1%    Time  8    Period   Weeks    Status  New    Target Date  11/13/19      PT LONG TERM GOAL #4   Title  Patient will increase BLE gross strength to 4+/5 as to improve functional strength for independent gait, increased standing tolerance and increased ADL ability.    Baseline  1/12: R grossly 3+/5 L grossly 4-/5    Time  8    Period  Weeks    Status  New    Target Date  11/13/19             Plan - 09/18/19 1725    Clinical Impression Statement  Patient is a pleasant 79 year old male who presents to physical therapy for imbalance s/p cerebellar  stroke. Patient demonstrates limited weight acceptance and strength of affected RLE with additional deficits in coordination/spatial awareness. Due to patient's dizziness with head turns, rolling in bed, and turning he will see a vestibular physical therapist for his next visit to rule out/in vestibular component of stability. Patient scored a 41/56 on the BERG with decreased stability with single limb tasks, coordination/velocity of movement tasks, and tasks altering COM. Patient will benefit from skilled physical therapy to increase stability, strength, and mobility for decreased fall risk and improved quality of life.    Personal Factors and Comorbidities  Age;Comorbidity 3+;Education;Past/Current Experience;Social Background;Time since onset of injury/illness/exacerbation    Comorbidities  PMH includes DM, hx of stroke, HLD, HTN, bradycardia, syncope and collapse, COPD, Diabetic retinopathy, nonrheumatic aortic valve stenosis, CVA, CAD, DDD, and chronic pain syndrome.    Examination-Activity Limitations  Bend;Caring for Others;Carry;Dressing;Lift;Locomotion Level;Reach Overhead;Squat;Stairs;Stand;Toileting;Transfers    Examination-Participation Restrictions  Church;Cleaning;Community Activity;Personal Finances;Meal Prep;Laundry;Shop;Volunteer;Yard Work    [employer]chant [employer] officer  Evolving/Moderate complexity    Clinical Decision Making  Moderate    Rehab  Potential  Fair    PT Frequency  2x / week    PT Duration  8 weeks    PT Treatment/Interventions  ADLs/Self Care Home Management;Aquatic Therapy;Biofeedback;Canalith Repostioning;Cryotherapy;Electrical Stimulation;Iontophoresis 4mg /ml Dexamethasone;Moist Heat;Traction;DME Instruction;Gait training;Stair training;Functional mobility training;Therapeutic activities;Therapeutic exercise;Balance training;Neuromuscular re-education;Cognitive remediation;Manual techniques;Orthotic Fit/Training;Patient/family education;Passive range of motion;Visual/perceptual remediation/compensation;Vestibular;Taping;Splinting;Energy conservation    PT Next Visit Plan  vestib ?    PT Home Exercise Plan  see above    Consulted and Agree with Plan of Care  Patient       Patient will benefit from skilled therapeutic intervention in order to improve the following deficits and impairments:  Abnormal gait, Cardiopulmonary status limiting activity, Decreased activity tolerance, Decreased balance, Decreased knowledge of precautions, Decreased endurance, Decreased coordination, Decreased mobility, Decreased safety awareness, Difficulty walking, Decreased strength, Dizziness, Impaired flexibility, Impaired perceived functional ability, Impaired sensation, Impaired UE functional use, Impaired vision/preception, Postural dysfunction, Improper body mechanics  Visit Diagnosis: Other abnormalities of gait and mobility  Unsteadiness on feet  Muscle weakness (generalized)     Problem List Patient Active Problem List   Diagnosis Date Noted  . Bradycardia 03/19/2019  . Chest pain 12/20/2016  . HTN (hypertension) 12/20/2016  . HLD (hyperlipidemia) 12/20/2016  . Syncope and collapse 12/20/2016  . Uncontrolled type 2 diabetes mellitus with microalbuminuria ([place]) 08/21/2014   Janna Arch, PT, DPT   09/19/2019, 7:36 AM  [place] MAIN [facility] SERVICES [place], [place],  [address] Phone: [phone]   Fax:  [phone]  Name: Mathew Baker MRN: [id number] Date of Birth: [birth year]

## 2019-09-18 NOTE — Patient Instructions (Signed)
Access Code: [email or web address]  URL: [email or web address]  Date: 09/18/2019  Prepared by: Janna Arch   Exercises Standing Hip Extension with Counter Support - 10 reps - 2 sets - 5 hold - 1x daily - 7x weekly Standing Tandem Balance with Counter Support - 2 reps - 2 sets - 30 hold - 1x daily - 7x weekly Standing March with Counter Support - 10 reps - 2 sets - 5 hold - 1x daily - 7x weekly Seated Long Arc Quad - 10 reps - 2 sets - 5 hold - 1x daily - 7x weekly Seated Hip Abduction with Resistance - 10 reps - 2 sets - 5 hold - 1x daily - 7x weekly

## 2019-09-19 ENCOUNTER — Ambulatory Visit: Payer: Medicare Other

## 2019-09-21 ENCOUNTER — Ambulatory Visit: Payer: Medicare Other

## 2019-09-21 ENCOUNTER — Other Ambulatory Visit: Payer: Self-pay

## 2019-09-21 DIAGNOSIS — R2689 Other abnormalities of gait and mobility: Secondary | ICD-10-CM

## 2019-09-21 DIAGNOSIS — R2681 Unsteadiness on feet: Secondary | ICD-10-CM

## 2019-09-21 DIAGNOSIS — M6281 Muscle weakness (generalized): Secondary | ICD-10-CM

## 2019-09-21 NOTE — Therapy (Deleted)
[place] MAIN [facility] SERVICES [address] [place], [place], [address] Phone: [phone]   Fax:  [phone]  Physical Therapy Evaluation  Patient Details  Name: Mathew Baker MRN: [id number] Date of Birth: [birth year] Referring Provider (PT): Jennings Books, MD   Encounter Date: 09/21/2019  PT End of Session - 09/21/19 1039    Visit Number  2    Number of Visits  16    Date for PT Re-Evaluation  11/13/19    Authorization Type  1/10 eval 1/12    Equipment Utilized During Treatment  Gait belt    Activity Tolerance  Patient tolerated treatment well    Behavior During Therapy  [facility] for tasks assessed/performed       Past Medical History:  Diagnosis Date  . Chronic airway obstruction ([place])   . Degeneration of intervertebral disc of lumbar region   . Diabetes mellitus without complication ([place])   . Essential hypertension, benign   . Hyperlipidemia   . Hypertension   . Osteoarthritis of lower extremity   . Pneumonia, organism unspecified([place])   . PSA elevation 07/16/2013   Normal    Past Surgical History:  Procedure Laterality Date  . CATARACT EXTRACTION W/ INTRAOCULAR LENS  IMPLANT, BILATERAL    . COLONOSCOPY  2006   Normal: Repeat in 10 yrs    There were no vitals filed for this visit.                  Objective measurements completed on examination: See above findings.                PT Short Term Goals - 09/19/19 0730      PT SHORT TERM GOAL #1   Title  Patient will be independent in home exercise program to improve strength/mobility for better functional independence with ADLs.    Baseline  1/13: HEP given    Time  2    Period  Weeks    Status  New    Target Date  10/03/19        PT Long Term Goals - 09/19/19 0731      PT LONG TERM GOAL #1   Title  Patient will increase 10 meter walk test to >1.60m/s as to improve gait speed for better community ambulation and to reduce fall risk.    Baseline  09/19/19: 0.53 no AD    Time  8    Period  Weeks    Status  New    Target Date  11/13/19      PT LONG TERM GOAL #2   Title  Patient will increase Berg Balance score by > 6 points (47/56) to demonstrate decreased fall risk during functional activities.    Baseline  1/13: 41/56    Time  8    Period  Weeks    Status  New    Target Date  11/13/19      PT LONG TERM GOAL #3   Title  Patient will increase ABC scale score >80% to demonstrate better functional mobility and better confidence with ADLs.    Baseline  1/13: 28.1%    Time  8    Period  Weeks    Status  New    Target Date  11/13/19      PT LONG TERM GOAL #4   Title  Patient will increase BLE gross strength to 4+/5 as to improve functional strength for independent gait, increased standing tolerance and increased ADL  ability.    Baseline  1/12: R grossly 3+/5 L grossly 4-/5    Time  8    Period  Weeks    Status  New    Target Date  11/13/19               Patient will benefit from skilled therapeutic intervention in order to improve the following deficits and impairments:     Visit Diagnosis: No diagnosis found.     Problem List Patient Active Problem List   Diagnosis Date Noted  . Bradycardia 03/19/2019  . Chest pain 12/20/2016  . HTN (hypertension) 12/20/2016  . HLD (hyperlipidemia) 12/20/2016  . Syncope and collapse 12/20/2016  . Uncontrolled type 2 diabetes mellitus with microalbuminuria ([place]) 08/21/2014    Taylynn Easton 09/21/2019, 10:39 AM  [place] MAIN [facility] SERVICES [place], [place], [address] Phone: [phone]   Fax:  [phone]  Name: Mathew Baker MRN: [id number] Date of Birth: 1941/08/25

## 2019-09-21 NOTE — Therapy (Signed)
[place] MAIN [facility] SERVICES [address] [place], [place], [address] Phone: [phone]   Fax:  [phone]  Physical Therapy Treatment  Patient Details  Name: Mathew Baker MRN: [id number] Date of Birth: August 21, 1941 Referring Provider (PT): Jennings Books, MD   Encounter Date: 09/21/2019  PT End of Session - 09/21/19 1039    Visit Number  2    Number of Visits  16    Date for PT Re-Evaluation  11/13/19    Authorization Type  2/10 eval 1/12    PT Start Time  1038    PT Stop Time  1125    PT Time Calculation (min)  47 min    Equipment Utilized During Treatment  Gait belt    Activity Tolerance  Patient tolerated treatment well    Behavior During Therapy  [facility] for tasks assessed/performed       Past Medical History:  Diagnosis Date  . Chronic airway obstruction ([place])   . Degeneration of intervertebral disc of lumbar region   . Diabetes mellitus without complication ([place])   . Essential hypertension, benign   . Hyperlipidemia   . Hypertension   . Osteoarthritis of lower extremity   . Pneumonia, organism unspecified([place])   . PSA elevation 07/16/2013   Normal    Past Surgical History:  Procedure Laterality Date  . CATARACT EXTRACTION W/ INTRAOCULAR LENS  IMPLANT, BILATERAL    . COLONOSCOPY  2006   Normal: Repeat in 10 yrs    There were no vitals filed for this visit.  Subjective Assessment - 09/21/19 1045    Subjective  Pt complains of lightheadedness sensation when transitioning from sitting to standing. His primary therapist is concerned that the pt may have some vestibular involvement due to patient's complaints of dizziness. No changes reported by patient since the initial evaluation.    Pertinent History  Patient is a pleasant 79 year old male who presents to physical therapy for imbalance. 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 Recent episodes of dizziness resulted in trip to ER upon which an MRI showed acute infarction affecting  cerebellar vermis and medial aspect of both inferior cerebellar hemispheres. 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 He did not receive TPA. Per previous documentation he additionally had episodes of dizziness. Was in the hospital for 13 days. Home health physical therapy was received for 4 weeks.  PMH includes DM, hx of stroke, HLD, HTN, bradycardia, syncope and collapse, COPD, Diabetic retinopathy, nonrheumatic aortic valve stenosis, CVA, CAD, DDD, and chronic pain syndrome. Patient reports 2-3 near falls a day.    Limitations  Lifting;Standing;Walking;House hold activities;Other (comment)    How long can you sit comfortably?  n/a    How long can you stand comfortably?  2 hours    How long can you walk comfortably?  needs to use stick, tries to not use walking stick.    Diagnostic tests  Imaging: MRI: acute infarction affecting cerebellar vermis and medial aspect of both inferior cerebellar hemispheres. 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    Patient Stated Goals  to get to walking right.    Currently in Pain?  No/denies       Neuromuscular Re-education  VESTIBULAR SCREEN  HISTORY:  Subjective history of current problem: Lightheadedness sensation that occurs when changing positions from sitting to standing. Symptoms started after his CVA.  Description of dizziness: lightheadedness, pt denies any vertigo Frequency: daily, occurs every time he stands up from a seated position Duration: a few seconds Symptom nature: (motion provoked, positional, spontaneous, constant, variable, intermittent)  motion-provoked, never spontaneous   Provocative Factors: changing positions from sitting to standing Easing Factors: waiting for symptoms to pass  Progression of symptoms: (better, worse, no change since onset) no change History of similar episodes: None prior to CVA  Falls (yes/no): No Number of falls in past 6 months: No  Prior Functional Level: Independent  Auditory complaints (tinnitus, pain, drainage, hearing loss, aural fullness): tinnitus bilaterally, no pain or drainage Vision (diplopia, visual field loss, recent changes, last eye exam): no visual changes  Red Flags: (dysarthria, dysphagia, drop attacks, bowel and bladder changes, recent weight loss/gain) Review of systems negative for red flags.     EXAMINATION  POSTURE: Mild forward head  NEUROLOGICAL SCREEN: (2+ unless otherwise noted.) N=normal  Ab=abnormal Diabetic neuropathy bilateral hands, none in feet  Level Dermatome R L Myotome R L Reflex R L  C3 Anterior Neck N N Sidebend C2-3 N N Jaw CN V    C4 Top of Shoulder N N Shoulder Shrug C4 N N Hoffman's UMN    C5 Lateral Upper Arm N N Shoulder ABD C4-5 N N Biceps C5-6    C6 Lateral Arm/ Thumb N N Arm Flex/ Wrist Ext C5-6 N N Brachiorad. C5-6    C7 Middle Finger N N Arm Ext//Wrist Flex C6-7 N N Triceps C7    C8 4th & 5th Finger N N Flex/ Ext Carpi Ulnaris C8 N N Patellar (L3-4)    T1 Medial Arm N N Interossei T1 N N Gastrocnemius    L2 Medial thigh/groin N N Illiopsoas (L2-3) N N     L3 Lower thigh/med.knee N N Quadriceps (L3-4) N N     L4 Medial leg/lat thigh N N Tibialis Ant (L4-5) N N     L5 Lat. leg & dorsal foot N N EHL (L5) N N     S1 post/lat foot/thigh/leg N N Gastrocnemius (S1-2) N N     S2 Post./med. thigh & leg N N Hamstrings (L4-S3) N N      Reflex testing deferred;  Cranial Nerves Visual acuity and visual fields are intact  Extraocular muscles are intact  Facial sensation is intact bilaterally  Facial strength is mostly intact bilaterally,  possible mild droop L corner of mouth Hearing is normal as tested by gross conversation Palate elevates midline, normal phonation  Shoulder shrug strength is intact  Tongue protrudes midline    SOMATOSENSORY:         Sensation           Intact      Diminished         Absent  Light touch Normal      COORDINATION: Finger to Nose: Normal Heel to Shin: Normal Pronator Drift: Negative Rapid Alternating Movements: Normal Finger to Thumb Opposition: Normal  MUSCULOSKELETAL SCREEN: Cervical Spine ROM: Severe loss of extension and lateral flexion, moderate loss of rotation, mild loss of flexion   ROM: Bilateral shoulder flexion and abduction AROM deficits, chronic  MMT: Grossly WFL, see initial evaluation for specific MMT  Functional Mobility:Independent transfers and gait with a walking stick   OCULOMOTOR / VESTIBULAR TESTING:  Oculomotor Exam- Room Light  Findings Comments  Ocular Alignment normal   Ocular ROM normal   Spontaneous Nystagmus normal   Gaze-Holding Nystagmus normal   End-Gaze Nystagmus normal   Vergence (normal 2-3") abnormal Approximately 12 inches  Smooth Pursuit abnormal Saccadic consistently throughout  Cross-Cover Test not examined   Saccades abnormal Normal horizontal bilateral, slow and hypometric vertically  VOR Cancellation normal   Left Head Thrust normal   Right Head Thrust normal   Static Acuity not examined   Dynamic Acuity not examined     Oculomotor Exam- Fixation Suppressed  Findings Comments  Ocular Alignment normal   Spontaneous Nystagmus abnormal Direction changing nystagmus, initially pure R horizontal, then L horizontal, a couple down beats also appreciated at end range L gaze  Gaze-Holding Nystagmus abnormal See above  End-Gaze Nystagmus abnormal See above  Head Shaking Nystagmus abnormal See above, no worsening of nystagmus with head shake  Pressure-Induced Nystagmus not examined   Hyperventilation Induced Nystagmus not examined    Skull Vibration Induced Nystagmus not examined     BPPV TESTS:  Symptoms Duration Intensity Nystagmus  L Dix-Hallpike None   None  R Dix-Hallpike None   None  L Head Roll None   None  R Head Roll None   None  L Sidelying Test      R Sidelying Test        Orthostatic Vitals Supine: BP: 145/71, HR: 59, SpO2: 99% Seated: BP: 129/61, HR: 68 , SpO2: 97% Standing 1 minute: BP: 129/55 , HR: 80, SpO2: 97% Standing 3 minutes: BP: 129/58, HR: 78, SpO2: 99%    Pt was referred by his primary therapist for a vestibular screen due to patient's complaints during the initial evaluation of dizziness. Upon further clarification today patient's complaints are more associated with lightheadedness during transfers from sitting to standing. Bedside vestibular evaluation is positive for central findings consistent with patients cerebellar insult including abnormal smooth pursuit eye movements, abnormal vertical saccades, and direction changing nystagmus with infrared goggles. His orthostatic vital signs are positive for 123456 diastolic pressure drop from supine to sitting. Pt reports only mild symptoms today during testing. All canal testing is negative for BPPV. Patient's symptoms appear most consistent with orthostatic hypotension which is not uncommon in older populations as well as post-stroke. Pt advised to stay hydrated and perform gentle marching in place after standing to increase blood pressure. Pt also encouraged to perform transfers slowly and to wait for symptoms to pass after standing before walking away from a steady/safe surface. Pt is appropriate to continue physical therapy to work on his balance in order to decrease his fall risk and improve his safety at home.         PT Short Term Goals - 09/19/19 0730      PT SHORT TERM GOAL #1   Title  Patient will be independent in home exercise program to improve strength/mobility for better functional independence with ADLs.    Baseline  1/13:  HEP given    Time  2    Period  Weeks    Status  New    Target Date  10/03/19        PT Long Term Goals - 09/19/19 0731      PT LONG TERM GOAL #1   Title  Patient will increase 10 meter walk test to >1.40m/s as to improve gait speed for better community ambulation and to reduce fall risk.    Baseline  09/19/19: 0.53 no AD    Time  8  Period  Weeks    Status  New    Target Date  11/13/19      PT LONG TERM GOAL #2   Title  Patient will increase Berg Balance score by > 6 points (47/56) to demonstrate decreased fall risk during functional activities.    Baseline  1/13: 41/56    Time  8    Period  Weeks    Status  New    Target Date  11/13/19      PT LONG TERM GOAL #3   Title  Patient will increase ABC scale score >80% to demonstrate better functional mobility and better confidence with ADLs.    Baseline  1/13: 28.1%    Time  8    Period  Weeks    Status  New    Target Date  11/13/19      PT LONG TERM GOAL #4   Title  Patient will increase BLE gross strength to 4+/5 as to improve functional strength for independent gait, increased standing tolerance and increased ADL ability.    Baseline  1/12: R grossly 3+/5 L grossly 4-/5    Time  8    Period  Weeks    Status  New    Target Date  11/13/19            Plan - 09/21/19 1045    Clinical Impression Statement  Pt was referred by his primary therapist for a vestibular screen due to patient's complaints during the initial evaluation of dizziness. Upon further clarification today patient's complaints are more associated with lightheadedness during transfers from sitting to standing. Bedside vestibular evaluation is positive for central findings consistent with patients cerebellar insult including abnormal smooth pursuit eye movements, abnormal vertical saccades, and direction changing nystagmus with infrared goggles. His orthostatic vital signs are positive for 123456 diastolic pressure drop from supine to sitting. Pt reports  only mild symptoms today during testing. All canal testing is negative for BPPV. Patient's symptoms appear most consistent with orthostatic hypotension which is not uncommon in older populations as well as post-stroke. Pt advised to stay hydrated and perform gentle marching in place after standing to increase blood pressure. Pt also encouraged to perform transfers slowly and to wait for symptoms to pass after standing before walking away from a steady/safe surface. Pt is appropriate to continue physical therapy to work on his balance in order to decrease his fall risk and improve his safety at home.    Personal Factors and Comorbidities  Age;Comorbidity 3+;Education;Past/Current Experience;Social Background;Time since onset of injury/illness/exacerbation    Comorbidities  PMH includes DM, hx of stroke, HLD, HTN, bradycardia, syncope and collapse, COPD, Diabetic retinopathy, nonrheumatic aortic valve stenosis, CVA, CAD, DDD, and chronic pain syndrome.    Examination-Activity Limitations  Bend;Caring for Others;Carry;Dressing;Lift;Locomotion Level;Reach Overhead;Squat;Stairs;Stand;Toileting;Transfers    Examination-Participation Restrictions  Church;Cleaning;Community Activity;Personal Finances;Meal Prep;Laundry;Shop;Volunteer;Yard Work    [employer]  Evolving/Moderate complexity    Rehab Potential  Fair    PT Frequency  2x / week    PT Duration  8 weeks    PT Treatment/Interventions  ADLs/Self Care Home Management;Aquatic Therapy;Biofeedback;Canalith Repostioning;Cryotherapy;Electrical Stimulation;Iontophoresis 4mg /ml Dexamethasone;Moist Heat;Traction;DME Instruction;Gait training;Stair training;Functional mobility training;Therapeutic activities;Therapeutic exercise;Balance training;Neuromuscular re-education;Cognitive remediation;Manual techniques;Orthotic Fit/Training;Patient/family education;Passive range of motion;Visual/perceptual  remediation/compensation;Vestibular;Taping;Splinting;Energy conservation    PT Next Visit Plan  Continue balance exercises and incorporate head turns    PT Home Exercise Plan  see above    Consulted and Agree with Plan of Care  Patient  Patient will benefit from skilled therapeutic intervention in order to improve the following deficits and impairments:  Abnormal gait, Cardiopulmonary status limiting activity, Decreased activity tolerance, Decreased balance, Decreased knowledge of precautions, Decreased endurance, Decreased coordination, Decreased mobility, Decreased safety awareness, Difficulty walking, Decreased strength, Dizziness, Impaired flexibility, Impaired perceived functional ability, Impaired sensation, Impaired UE functional use, Impaired vision/preception, Postural dysfunction, Improper body mechanics  Visit Diagnosis: Other abnormalities of gait and mobility  Unsteadiness on feet  Muscle weakness (generalized)     Problem List Patient Active Problem List   Diagnosis Date Noted  . Bradycardia 03/19/2019  . Chest pain 12/20/2016  . HTN (hypertension) 12/20/2016  . HLD (hyperlipidemia) 12/20/2016  . Syncope and collapse 12/20/2016  . Uncontrolled type 2 diabetes mellitus with microalbuminuria ([place]) 08/21/2014   Phillips Grout PT, DPT, GCS  Michell Giuliano 09/21/2019, 4:05 PM  [place] MAIN [facility] SERVICES [address] [place], [place], [address] Phone: [phone]   Fax:  [phone]  Name: Mathew Baker MRN: [id number] Date of Birth: [birth year]

## 2019-09-24 ENCOUNTER — Ambulatory Visit: Payer: Medicare Other

## 2019-09-26 ENCOUNTER — Ambulatory Visit: Payer: Medicare Other

## 2019-10-01 ENCOUNTER — Ambulatory Visit: Payer: Medicare Other

## 2019-10-03 ENCOUNTER — Ambulatory Visit: Payer: Medicare Other

## 2019-10-08 ENCOUNTER — Ambulatory Visit: Payer: Medicare Other | Admitting: Physical Therapy

## 2019-10-10 ENCOUNTER — Ambulatory Visit: Payer: Medicare Other

## 2019-10-15 ENCOUNTER — Ambulatory Visit: Payer: Medicare Other | Attending: Neurology

## 2019-10-15 ENCOUNTER — Other Ambulatory Visit: Payer: Self-pay

## 2019-10-15 DIAGNOSIS — M6281 Muscle weakness (generalized): Secondary | ICD-10-CM | POA: Insufficient documentation

## 2019-10-15 DIAGNOSIS — R2681 Unsteadiness on feet: Secondary | ICD-10-CM | POA: Insufficient documentation

## 2019-10-15 DIAGNOSIS — R2689 Other abnormalities of gait and mobility: Secondary | ICD-10-CM | POA: Insufficient documentation

## 2019-10-15 NOTE — Therapy (Signed)
[place] MAIN [facility] SERVICES [address] [place], [place], [address] Phone: [phone]   Fax:  [phone]  Physical Therapy Treatment  Patient Details  Name: Mathew Baker MRN: [id number] Date of Birth: 05/16/1941 Referring Provider (PT): Jennings Books, MD   Encounter Date: 10/15/2019  PT End of Session - 10/15/19 1606    Visit Number  3    Number of Visits  16    Date for PT Re-Evaluation  11/13/19    Authorization Type  3/10 eval 1/12    PT Start Time  1600    PT Stop Time  1644    PT Time Calculation (min)  44 min    Equipment Utilized During Treatment  Gait belt    Activity Tolerance  Patient tolerated treatment well    Behavior During Therapy  [employer] for tasks assessed/performed       Past Medical History:  Diagnosis Date  . Chronic airway obstruction ([place])   . Degeneration of intervertebral disc of lumbar region   . Diabetes mellitus without complication ([place])   . Essential hypertension, benign   . Hyperlipidemia   . Hypertension   . Osteoarthritis of lower extremity   . Pneumonia, organism unspecified(486)   . PSA elevation 07/16/2013   Normal    Past Surgical History:  Procedure Laterality Date  . CATARACT EXTRACTION W/ INTRAOCULAR LENS  IMPLANT, BILATERAL    . COLONOSCOPY  2006   Normal: Repeat in 10 yrs    There were no vitals filed for this visit.  Subjective Assessment - 10/15/19 1605    Subjective  Patient returned from trip and is eager to participate with his balance. No falls since last session.    Pertinent History  Patient is a pleasant 79 year old male who presents to physical therapy for imbalance. 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 He did not receive TPA. Per previous documentation he additionally had episodes of dizziness. Was in the hospital for 13 days. Home health physical therapy was received for 4 weeks.  PMH includes DM, hx of stroke, HLD, HTN, bradycardia, syncope and collapse, COPD, Diabetic retinopathy, nonrheumatic aortic valve stenosis, CVA, CAD, DDD, and chronic pain syndrome. Patient reports 2-3 near falls a day.    Limitations  Lifting;Standing;Walking;House hold activities;Other (comment)    How long can you sit comfortably?  n/a    How long can you stand comfortably?  2 hours    How long can you walk comfortably?  needs to use stick, tries to not use walking stick.    Diagnostic tests  Imaging: MRI: acute infarction affecting cerebellar vermis and medial aspect of both inferior cerebellar hemispheres. 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    Patient Stated Goals  to get to walking right.    Currently in Pain?  No/denies          Treatment:   Standing in // bars: airex pad: 6" step forward modified tandem stance x 30 seconds, lateral stance one foot on  each surface 30 seconds each LE   Heel toe raises 15x  With UE support  Modified tandem, heel toe rocking 10x each LE back to replicate preswing and initial swing  bosu ball modified forward lunges with SUE support 12x each LE ; lateral modified lunges 12x each LE sUE support   Step over and back orange hurdle SUE support 12x each LE  RTB monster walks. 4x length of // bars. Cueing for depth of squat, exercise technique and upright posture.   seated:   RTB-progressed to BTB PF 15x; patient educated self  performance.  BTB DF; 15x each LE, SPT stabilize  5lb ankle weight  -marching 10x each LE, upright posture -LAQ 12x each LE, excessive shaking of LE's with fatigue    Pt educated throughout session about proper posture and technique with exercises. Improved exercise technique, movement at target joints, use of target muscles after min to mod verbal, visual, tactile cues.    Patient demonstrates occasional posterior LOB with limited ankle righting reactions. Patient is very motivated throughout session and demonstrates good capacity for standing interventions. Coordination with spatial awareness of foot placement is limited when fatigued. Patient will benefit from skilled physical therapy to increase stability, strength, and mobility for decreased fall risk and improved quality of life              PT Education - 10/15/19 1605    Education Details  exercise technique, body mechanics, stability    Person(s) Educated  Patient    Methods  Explanation;Demonstration;Tactile cues;Verbal cues    Comprehension  Verbalized understanding;Returned demonstration;Verbal cues required;Tactile cues required       PT Short Term Goals - 09/19/19 0730      PT SHORT TERM GOAL #1   Title  Patient will be independent in home exercise program to improve strength/mobility for better functional independence with ADLs.    Baseline  1/13: HEP given    Time  2    Period  Weeks    Status  New    Target Date  10/03/19        PT Long Term Goals - 09/19/19 0731      PT LONG TERM GOAL #1   Title  Patient will increase 10 meter walk test to >1.3m/s as to improve gait speed for better community ambulation and to reduce fall risk.    Baseline  09/19/19: 0.53 no AD    Time  8    Period  Weeks    Status  New    Target Date  11/13/19      PT LONG TERM GOAL #2   Title  Patient will increase Berg Balance score by > 6 points (47/56) to demonstrate decreased fall risk during functional activities.     Baseline  1/13: 41/56    Time  8    Period  Weeks    Status  New    Target Date  11/13/19      PT LONG TERM GOAL #3   Title  Patient will increase ABC scale score >80% to demonstrate better functional mobility and better confidence with ADLs.    Baseline  1/13: 28.1%    Time  8    Period  Weeks    Status  New    Target Date  11/13/19      PT LONG TERM GOAL #4   Title  Patient will increase BLE gross strength to 4+/5 as to improve functional strength for independent gait, increased standing  tolerance and increased ADL ability.    Baseline  1/12: R grossly 3+/5 L grossly 4-/5    Time  8    Period  Weeks    Status  New    Target Date  11/13/19            Plan - 10/15/19 1637    Clinical Impression Statement  Patient demonstrates occasional posterior LOB with limited ankle righting reactions. Patient is very motivated throughout session and demonstrates good capacity for standing interventions. Coordination with spatial awareness of foot placement is limited when fatigued. Patient will benefit from skilled physical therapy to increase stability, strength, and mobility for decreased fall risk and improved quality of life    Personal Factors and Comorbidities  Age;Comorbidity 3+;Education;Past/Current Experience;Social Background;Time since onset of injury/illness/exacerbation    Comorbidities  PMH includes DM, hx of stroke, HLD, HTN, bradycardia, syncope and collapse, COPD, Diabetic retinopathy, nonrheumatic aortic valve stenosis, CVA, CAD, DDD, and chronic pain syndrome.    Examination-Activity Limitations  Bend;Caring for Others;Carry;Dressing;Lift;Locomotion Level;Reach Overhead;Squat;Stairs;Stand;Toileting;Transfers    Examination-Participation Restrictions  Church;Cleaning;Community Activity;Personal Finances;Meal Prep;Laundry;Shop;Volunteer;Yard Work    [employer]  Evolving/Moderate complexity    Rehab Potential  Fair    PT Frequency  2x / week    PT  Duration  8 weeks    PT Treatment/Interventions  ADLs/Self Care Home Management;Aquatic Therapy;Biofeedback;Canalith Repostioning;Cryotherapy;Electrical Stimulation;Iontophoresis 4mg /ml Dexamethasone;Moist Heat;Traction;DME Instruction;Gait training;Stair training;Functional mobility training;Therapeutic activities;Therapeutic exercise;Balance training;Neuromuscular re-education;Cognitive remediation;Manual techniques;Orthotic Fit/Training;Patient/family education;Passive range of motion;Visual/perceptual remediation/compensation;Vestibular;Taping;Splinting;Energy conservation    PT Next Visit Plan  Continue balance exercises and incorporate head turns    PT Home Exercise Plan  see above    Consulted and Agree with Plan of Care  Patient       Patient will benefit from skilled therapeutic intervention in order to improve the following deficits and impairments:  Abnormal gait, Cardiopulmonary status limiting activity, Decreased activity tolerance, Decreased balance, Decreased knowledge of precautions, Decreased endurance, Decreased coordination, Decreased mobility, Decreased safety awareness, Difficulty walking, Decreased strength, Dizziness, Impaired flexibility, Impaired perceived functional ability, Impaired sensation, Impaired UE functional use, Impaired vision/preception, Postural dysfunction, Improper body mechanics  Visit Diagnosis: Other abnormalities of gait and mobility  Unsteadiness on feet  Muscle weakness (generalized)     Problem List Patient Active Problem List   Diagnosis Date Noted  . Bradycardia 03/19/2019  . Chest pain 12/20/2016  . HTN (hypertension) 12/20/2016  . HLD (hyperlipidemia) 12/20/2016  . Syncope and collapse 12/20/2016  . Uncontrolled type 2 diabetes mellitus with microalbuminuria ([place]) 08/21/2014    Janna Arch, PT, DPT   10/15/2019, 4:47 PM  [place] MAIN [facility] SERVICES [address] [place], [place],  [address] Phone: [phone]   Fax:  [phone]  Name: Averey Marold MRN: [id number] Date of Birth: [birth year]

## 2019-10-17 ENCOUNTER — Ambulatory Visit: Payer: Medicare Other

## 2019-10-17 ENCOUNTER — Other Ambulatory Visit: Payer: Self-pay

## 2019-10-17 DIAGNOSIS — R2689 Other abnormalities of gait and mobility: Secondary | ICD-10-CM | POA: Diagnosis not present

## 2019-10-17 DIAGNOSIS — R2681 Unsteadiness on feet: Secondary | ICD-10-CM

## 2019-10-17 DIAGNOSIS — M6281 Muscle weakness (generalized): Secondary | ICD-10-CM

## 2019-10-17 NOTE — Therapy (Signed)
[place] MAIN [facility] SERVICES [address] [place], [place], [address] Phone: [phone]   Fax:  [phone]  Physical Therapy Treatment  Patient Details  Name: Mathew Baker MRN: [id number] Date of Birth: [birth year] Referring Provider (PT): Jennings Books, MD   Encounter Date: 10/17/2019  PT End of Session - 10/17/19 1814    Visit Number  4    Number of Visits  16    Date for PT Re-Evaluation  11/13/19    Authorization Type  4/10 eval 1/12    PT Start Time  1514    PT Stop Time  1555    PT Time Calculation (min)  41 min    Equipment Utilized During Treatment  Gait belt    Activity Tolerance  Patient tolerated treatment well    Behavior During Therapy  [facility] for tasks assessed/performed       Past Medical History:  Diagnosis Date  . Chronic airway obstruction ([place])   . Degeneration of intervertebral disc of lumbar region   . Diabetes mellitus without complication ([place])   . Essential hypertension, benign   . Hyperlipidemia   . Hypertension   . Osteoarthritis of lower extremity   . Pneumonia, organism unspecified([place])   . PSA elevation 07/16/2013   Normal    Past Surgical History:  Procedure Laterality Date  . CATARACT EXTRACTION W/ INTRAOCULAR LENS  IMPLANT, BILATERAL    . COLONOSCOPY  2006   Normal: Repeat in 10 yrs    There were no vitals filed for this visit.   // bars with CGA: Step over hurdle onto airex with each LE and then step back, SUE support, 12x each LE  Soldier walks, 2 lengths // bars  Standing RTB paloff press, 10x with PT resistance from L, 10x from R, for stability and core activation with perturbation, patient had noted anterior trunk lean, cueing for shoulder depression  SLB with one LE on dynadisc, no UE support, 2x 20 sec each LE, increased difficulty with LLE on dynadisc  Soccer ball roll out, no UE support, 10x each LE, for weight acceptance of standing LE and control of opposite LE, one near LOB  with RLE on ball  6" step side step ups, both feet up both feet down, no UE support, 10x each LE leading, limited spatial awareness of foot placement on step but was able to self-correct  6" step front step ups, both feet up both feet down, no UE support, 10x each LE leading  Standing marches with black tubing TB on // bars resisting hip flexion, 10x each LE, no UE support, patient reported feeling lack of challenge/difficulty  Seated marches with RTB around feet for resistance to hip flexion and DF, 2x10 each LE  Seated dynadisc clockwise/counterclockwise R ankle circles, 10x each direction, for ankle strengthening and control/coordination  6" step and airex wide-tandem balance, one LE on each, no UE support, 20 sec each LE  6" step and airex balance facing side, one LE on each, no UE support, 20 sec each LE   Pt educated throughout session about proper posture and technique with exercises. Improved exercise technique, movement at target joints, use of target muscles after min to mod verbal, visual, tactile cues.   Patient presented to PT with excellent motivation. He performed balance exercises without UE support well when relying on LLE . Patient was challenged by ankle righting reactions and balancing on RLE, as well as with LE spatial awareness. He is limited in R  ankle muscle recruitment. Patient will benefit from skilled PT to increase LE strength, coordination, and mobility for decreased fall risk and improved QOL.            PT Education - 10/17/19 1509    Education Details  exercise technique, body mechanics    Person(s) Educated  Patient    Methods  Explanation;Demonstration;Tactile cues;Verbal cues    Comprehension  Verbalized understanding;Returned demonstration;Verbal cues required;Tactile cues required       PT Short Term Goals - 09/19/19 0730      PT SHORT TERM GOAL #1   Title  Patient will be independent in home exercise program to improve strength/mobility  for better functional independence with ADLs.    Baseline  1/13: HEP given    Time  2    Period  Weeks    Status  New    Target Date  10/03/19        PT Long Term Goals - 09/19/19 0731      PT LONG TERM GOAL #1   Title  Patient will increase 10 meter walk test to >1.15m/s as to improve gait speed for better community ambulation and to reduce fall risk.    Baseline  09/19/19: 0.53 no AD    Time  8    Period  Weeks    Status  New    Target Date  11/13/19      PT LONG TERM GOAL #2   Title  Patient will increase Berg Balance score by > 6 points (47/56) to demonstrate decreased fall risk during functional activities.    Baseline  1/13: 41/56    Time  8    Period  Weeks    Status  New    Target Date  11/13/19      PT LONG TERM GOAL #3   Title  Patient will increase ABC scale score >80% to demonstrate better functional mobility and better confidence with ADLs.    Baseline  1/13: 28.1%    Time  8    Period  Weeks    Status  New    Target Date  11/13/19      PT LONG TERM GOAL #4   Title  Patient will increase BLE gross strength to 4+/5 as to improve functional strength for independent gait, increased standing tolerance and increased ADL ability.    Baseline  1/12: R grossly 3+/5 L grossly 4-/5    Time  8    Period  Weeks    Status  New    Target Date  11/13/19            Plan - 10/17/19 1813    Clinical Impression Statement  Patient presented to PT with excellent motivation. He performed balance exercises without UE support well when relying on LLE . Patient was challenged by ankle righting reactions and balancing on RLE, as well as with LE spatial awareness. He is limited in R ankle muscle recruitment. Patient will benefit from skilled PT to increase LE strength, coordination, and mobility for decreased fall risk and improved QOL.    Personal Factors and Comorbidities  Age;Comorbidity 3+;Education;Past/Current Experience;Social Background;Time since onset of  injury/illness/exacerbation    Comorbidities  PMH includes DM, hx of stroke, HLD, HTN, bradycardia, syncope and collapse, COPD, Diabetic retinopathy, nonrheumatic aortic valve stenosis, CVA, CAD, DDD, and chronic pain syndrome.    Examination-Activity Limitations  Bend;Caring for Others;Carry;Dressing;Lift;Locomotion Level;Reach Overhead;Squat;Stairs;Stand;Toileting;Transfers    Examination-Participation Restrictions  Church;Cleaning;Community Activity;Personal Finances;Meal Prep;Laundry;Shop;Volunteer;[employer] Work  Stability/Clinical Decision Making  Evolving/Moderate complexity    Rehab Potential  Fair    PT Frequency  2x / week    PT Duration  8 weeks    PT Treatment/Interventions  ADLs/Self Care Home Management;Aquatic Therapy;Biofeedback;Canalith Repostioning;Cryotherapy;Electrical Stimulation;Iontophoresis 4mg /ml Dexamethasone;Moist Heat;Traction;DME Instruction;Gait training;Stair training;Functional mobility training;Therapeutic activities;Therapeutic exercise;Balance training;Neuromuscular re-education;Cognitive remediation;Manual techniques;Orthotic Fit/Training;Patient/family education;Passive range of motion;Visual/perceptual remediation/compensation;Vestibular;Taping;Splinting;Energy conservation    PT Next Visit Plan  Continue balance exercises and incorporate head turns    PT Home Exercise Plan  see above    Consulted and Agree with Plan of Care  Patient       Patient will benefit from skilled therapeutic intervention in order to improve the following deficits and impairments:  Abnormal gait, Cardiopulmonary status limiting activity, Decreased activity tolerance, Decreased balance, Decreased knowledge of precautions, Decreased endurance, Decreased coordination, Decreased mobility, Decreased safety awareness, Difficulty walking, Decreased strength, Dizziness, Impaired flexibility, Impaired perceived functional ability, Impaired sensation, Impaired UE functional use, Impaired  vision/preception, Postural dysfunction, Improper body mechanics  Visit Diagnosis: Other abnormalities of gait and mobility  Unsteadiness on feet  Muscle weakness (generalized)     Problem List Patient Active Problem List   Diagnosis Date Noted  . Bradycardia 03/19/2019  . Chest pain 12/20/2016  . HTN (hypertension) 12/20/2016  . HLD (hyperlipidemia) 12/20/2016  . Syncope and collapse 12/20/2016  . Uncontrolled type 2 diabetes mellitus with microalbuminuria ([place]) 08/21/2014    Florinda Marker, SPT This entire session was performed under direct supervision and direction of a licensed therapist/therapist assistant . I have personally read, edited and approve of the note as written.  Janna Arch, PT, DPT   10/17/2019, 6:17 PM  [place] MAIN [facility] SERVICES [address] [place], [place], [address] Phone: [phone]   Fax:  [phone]  Name: Mathew Baker MRN: [id number] Date of Birth: [birth year]

## 2019-10-23 ENCOUNTER — Ambulatory Visit: Payer: Medicare Other | Admitting: Physical Therapy

## 2019-10-25 ENCOUNTER — Ambulatory Visit: Payer: Medicare Other

## 2019-10-29 ENCOUNTER — Other Ambulatory Visit: Payer: Self-pay

## 2019-10-29 ENCOUNTER — Ambulatory Visit: Payer: Medicare Other

## 2019-10-29 DIAGNOSIS — R2689 Other abnormalities of gait and mobility: Secondary | ICD-10-CM | POA: Diagnosis not present

## 2019-10-29 DIAGNOSIS — M6281 Muscle weakness (generalized): Secondary | ICD-10-CM

## 2019-10-29 DIAGNOSIS — R2681 Unsteadiness on feet: Secondary | ICD-10-CM

## 2019-10-29 NOTE — Therapy (Signed)
[place] MAIN [facility] SERVICES [address] [place], [place], [address] Phone: [phone]   Fax:  [phone]  Physical Therapy Treatment  Patient Details  Name: Mathew Baker MRN: [id number] Date of Birth: [birth year] Referring Provider (PT): Jennings Books, MD   Encounter Date: 10/29/2019  PT End of Session - 10/29/19 1420    Visit Number  5    Number of Visits  16    Date for PT Re-Evaluation  11/13/19    Authorization Type  5/10 eval 1/12    PT Start Time  1430    PT Stop Time  1514    PT Time Calculation (min)  44 min    Equipment Utilized During Treatment  Gait belt    Activity Tolerance  Patient tolerated treatment well    Behavior During Therapy  [facility] for tasks assessed/performed       Past Medical History:  Diagnosis Date  . Chronic airway obstruction ([place])   . Degeneration of intervertebral disc of lumbar region   . Diabetes mellitus without complication ([place])   . Essential hypertension, benign   . Hyperlipidemia   . Hypertension   . Osteoarthritis of lower extremity   . Pneumonia, organism unspecified(486)   . PSA elevation 07/16/2013   Normal    Past Surgical History:  Procedure Laterality Date  . CATARACT EXTRACTION W/ INTRAOCULAR LENS  IMPLANT, BILATERAL    . COLONOSCOPY  2006   Normal: Repeat in 10 yrs    There were no vitals filed for this visit.  Subjective Assessment - 10/29/19 1421    Subjective  Patient reports no pain and no falls/LOB since last session.    Pertinent History  Patient is a pleasant 79 year old male who presents to physical therapy for imbalance. 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 He did not receive TPA. Per previous documentation he additionally had episodes of dizziness. Was in the hospital for 13 days. Home health physical therapy was received for 4 weeks.  PMH includes DM, hx of stroke, HLD, HTN, bradycardia, syncope and collapse, COPD, Diabetic retinopathy, nonrheumatic aortic valve stenosis, CVA, CAD, DDD, and chronic pain syndrome. Patient reports 2-3 near falls a day.    Limitations  Lifting;Standing;Walking;House hold activities;Other (comment)    How long can you sit comfortably?  n/a    How long can you stand comfortably?  2 hours    How long can you walk comfortably?  needs to use stick, tries to not use walking stick.    Diagnostic tests  Imaging: MRI: acute infarction affecting cerebellar vermis and medial aspect of both inferior cerebellar hemispheres. 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    Patient Stated Goals  to get to walking right.    Currently in Pain?  No/denies    Multiple Pain Sites  No     Therapeutic Exercise, CGA: Airex basketball to hoop 5 ft away, 4 min, for dynamic balance; reaching to left for ball for half the time, reaching to right  for ball second half of time  BLE leg press, 57.5#, 10x; cueing for muscle activation and sequencing/control of velocity of recruitment patterns  BLE leg press with RTB abduction, 57.5#, 15x cueing for muscle activation and sequencing/control of velocity of recruitment patterns   Leg press machine calf raises, 27.5#, 10x, initial coordination challenge corrected with Min-Mod A cueing  // bars: Soldier walks, 1 length // bars BUE support, 1 length SUE, 2 lengths no UE support, for gait mechanics and LE  weight acceptance/single leg balance  Walking rainbow ball bounce beside // bars, 1 length of bars each UE, pt challenged with coordination with multiple losses of rainbow ball control  Side steps with rainbow ball toss to PT, quick pace encouraged, 4 lengths // bars, for coordination  Unilateral farmer's carry, 5#, 4 lengths // bar each UE carry, Min A cueing for upright posture  Standing bicep curls, 5#, 10x, Mod A cueing given for proper form with correction still required  Seated march with contralateral UE shoulder press up with 5# dumbbell, 10x, R shoulder limited ROM and pain with flexion, cueing for abdominal activation   Bosu lateral modified lunges, no UE support, 10x each LE, slight forward lean  BTB standing knee extension, BUE support, 10x each LE  Single leg balance with opposite toe down for assistance, SUE support for 20 sec each LE, no UE support for 20 sec each LE   Patient presented with excellent motivation. He performed well this session with dynamic balance without UE support. He continues to sway when challenged by balance exercises, with anterior lean. He was also challenged by coordination exercises and required Min-Mod A cueing for moderate complexity tasks. Patient would benefit from further skilled PT intervention to address strength, coordination, and balance deficits in order to decrease fall risk and improve QOL.                     PT Education - 10/29/19 1419    Education Details  exercise technique, body mechanics    Person(s) Educated  Patient    Methods  Explanation;Demonstration;Tactile cues;Verbal cues    Comprehension  Verbalized understanding;Returned demonstration;Verbal cues required;Tactile cues required       PT Short Term Goals - 09/19/19 0730      PT SHORT TERM GOAL #1   Title  Patient will be independent in home exercise program to improve strength/mobility for better functional independence with ADLs.    Baseline   1/13: HEP given    Time  2    Period  Weeks    Status  New    Target Date  10/03/19        PT Long Term Goals - 09/19/19 0731      PT LONG TERM GOAL #1   Title  Patient will increase 10 meter walk test to >1.89m/s as to improve gait speed for better community ambulation and to reduce fall risk.    Baseline  09/19/19: 0.53 no AD    Time  8    Period  Weeks    Status  New    Target Date  11/13/19      PT LONG TERM GOAL #2   Title  Patient will increase Berg Balance score by > 6 points (47/56) to demonstrate decreased fall risk during functional activities.    Baseline  1/13: 41/56    Time  8    Period  Weeks    Status  New    Target Date  11/13/19      PT LONG TERM GOAL #3   Title  Patient will increase ABC scale score >80% to demonstrate better functional mobility and better confidence with ADLs.    Baseline  1/13: 28.1%    Time  8    Period  Weeks    Status  New    Target Date  11/13/19      PT LONG TERM GOAL #4   Title  Patient will increase BLE gross strength to 4+/5 as to improve functional strength for independent gait, increased standing tolerance and increased ADL ability.    Baseline  1/12: R grossly 3+/5 L grossly 4-/5    Time  8    Period  Weeks    Status  New    Target Date  11/13/19            Plan - 10/29/19 1551    Clinical Impression Statement  Patient presented with excellent motivation. He performed well this session with dynamic balance without UE support. He continues to sway when challenged by balance exercises, with anterior lean. He was also challenged by coordination exercises and required Min-Mod A cueing for moderate complexity tasks. Patient would benefit from further skilled PT intervention to address strength, coordination, and balance deficits in order to decrease fall risk and improve QOL.    Personal Factors and Comorbidities  Age;Comorbidity 3+;Education;Past/Current Experience;Social Background;Time since onset of  injury/illness/exacerbation    Comorbidities  PMH includes DM, hx of stroke, HLD, HTN, bradycardia, syncope and collapse, COPD, Diabetic retinopathy, nonrheumatic aortic valve stenosis, CVA, CAD, DDD, and chronic pain syndrome.    Examination-Activity Limitations  Bend;Caring for Others;Carry;Dressing;Lift;Locomotion Level;Reach Overhead;Squat;Stairs;Stand;Toileting;Transfers    Examination-Participation Restrictions  Church;Cleaning;Community Activity;Personal Finances;Meal Prep;Laundry;Shop;Volunteer;Yard Work    Merchant navy officer  Evolving/Moderate complexity    Rehab Potential  Fair    PT Frequency  2x / week    PT Duration  8 weeks    PT Treatment/Interventions  ADLs/Self Care Home Management;Aquatic Therapy;Biofeedback;Canalith Repostioning;Cryotherapy;Electrical Stimulation;Iontophoresis 4mg /ml Dexamethasone;Moist Heat;Traction;DME Instruction;Gait training;Stair training;Functional mobility training;Therapeutic activities;Therapeutic exercise;Balance training;Neuromuscular re-education;Cognitive remediation;Manual techniques;Orthotic Fit/Training;Patient/family education;Passive range of motion;Visual/perceptual remediation/compensation;Vestibular;Taping;Splinting;Energy conservation    PT Next Visit Plan  Continue balance exercises and incorporate head turns    PT Home Exercise Plan  see above    Consulted and Agree with Plan of Care  Patient       Patient will benefit from skilled therapeutic intervention in order to improve the following deficits and impairments:  Abnormal gait, Cardiopulmonary status limiting activity, Decreased activity tolerance, Decreased balance, Decreased knowledge of precautions, Decreased endurance, Decreased coordination, Decreased mobility, Decreased safety awareness, Difficulty walking, Decreased strength, Dizziness, Impaired flexibility, Impaired perceived functional ability, Impaired sensation, Impaired UE functional use, Impaired  vision/preception, Postural dysfunction, Improper body mechanics  Visit Diagnosis: Other abnormalities of gait and mobility  Unsteadiness on feet  Muscle weakness (generalized)     Problem List Patient Active Problem List   Diagnosis Date Noted  . Bradycardia 03/19/2019  . Chest pain 12/20/2016  . HTN (hypertension) 12/20/2016  . HLD (hyperlipidemia) 12/20/2016  . Syncope and collapse 12/20/2016  . Uncontrolled type 2 diabetes mellitus with microalbuminuria ([place]) 08/21/2014    Florinda Marker, SPT This entire session was performed under direct supervision and direction of a licensed therapist/therapist assistant . I have personally read, edited and approve of the note as written.   Janna Arch, PT, DPT   10/29/2019, 3:52 PM  [place] MAIN [facility] SERVICES [address]  [place], [place], [address] Phone: [phone]   Fax:  [phone]  Name: Mathew Baker MRN: [id number] Date of Birth: 10/31/1940

## 2019-10-31 ENCOUNTER — Other Ambulatory Visit: Payer: Self-pay

## 2019-10-31 ENCOUNTER — Ambulatory Visit: Payer: Medicare Other

## 2019-10-31 DIAGNOSIS — R2681 Unsteadiness on feet: Secondary | ICD-10-CM

## 2019-10-31 DIAGNOSIS — R2689 Other abnormalities of gait and mobility: Secondary | ICD-10-CM

## 2019-10-31 DIAGNOSIS — M6281 Muscle weakness (generalized): Secondary | ICD-10-CM

## 2019-10-31 NOTE — Therapy (Addendum)
[place] MAIN [facility] SERVICES [address] [place], [place], [address] Phone: [phone]   Fax:  [phone]  Physical Therapy Treatment  Patient Details  Name: Mathew Baker MRN: [id number] Date of Birth: 07-Dec-1940 Referring Provider (PT): Jennings Books, MD   Encounter Date: 10/31/2019  PT End of Session - 10/31/19 1518    Visit Number  6    Number of Visits  16    Date for PT Re-Evaluation  11/13/19    Authorization Type  6/10 eval 1/12    PT Start Time  1430    PT Stop Time  1512    PT Time Calculation (min)  42 min    Equipment Utilized During Treatment  Gait belt    Activity Tolerance  Patient tolerated treatment well    Behavior During Therapy  [place] for tasks assessed/performed       Past Medical History:  Diagnosis Date  . Chronic airway obstruction ([place])   . Degeneration of intervertebral disc of lumbar region   . Diabetes mellitus without complication ([place])   . Essential hypertension, benign   . Hyperlipidemia   . Hypertension   . Osteoarthritis of lower extremity   . Pneumonia, organism unspecified(486)   . PSA elevation 07/16/2013   Normal    Past Surgical History:  Procedure Laterality Date  . CATARACT EXTRACTION W/ INTRAOCULAR LENS  IMPLANT, BILATERAL    . COLONOSCOPY  2006   Normal: Repeat in 10 yrs    There were no vitals filed for this visit.   Therapeutic Exercise: // bars, CGA: Soldier walks with 3# ankle weights, BUE support 1 length // bars, no UE support 1 length // bars, cueing for decreased speed for control and to increase single leg stance time  Standing hip abduction with ankle weight, 5#, 2x10 each LE, cue for upright posture and decreased hip ER to target proper musculature  Standing hip extension with ankle weight, 5#, 2x10 each LE, cueing for upright posture and decreased lean into bar   Wooden beam step overs/back facing side mirror (over over back back), BUE support, 2 lengths // bars,  Mod-Max VC required due to coordination challenge   Taped line step overs/back facing side mirror (over over back back), no UE support, 3 lengths // bars, pt challenged by backwards step portion with slight LOB   Ambulating with sudden stop to catch football, pass back to PT and begin walking, 2 lengths // bars, for response time/coordination/dynamic gait  Airex and dynadisc balance beside treadmill, one LE on each surface, 20 sec each LE forward, slight SUE support (2 fingers on treadmill bar)  Airex and dynadisc balance facing side, one LE on each surface, lateral weight shifts, no UE support 20 sec facing each direction  Standing cross body punches to boxing mitt, 10x total, for dynamic stability and cardiovascular exertion  TRX STS, 10x, cueing for decreased UE reliance  Cable machine resisted walks with waist attachment (facing each side, front, and back), 2.5#, 10 steps out each direction, cueing for decreased hip ER to target proper musculature, reminders to control movement due to machine's resistance pulling back in     Patient presented with excellent motivation. He performed well with strength exercises, as displayed during standing hip abduction and extension exercises and weighted soldier walks. He continues to be challenged by coordination with moderate complexity tasks, yet is able to correct with Mod cueing. He is able to balance on unsteady surfaces with slight SUE support,  and has LOB without UE support. Patient would benefit from further skilled PT intervention to continue strength improvements and improve coordination and stability in order to decrease fall risk and increase QOL.      Access Code: [email or web address] URL: [email or web address] Date: 10/31/2019  Prepared by: Janna Arch   Exercises           Straight Leg Raise with Ankle Weight - 10 reps - 2 sets - 5 hold - 1x daily - 7x weekly           Standing Knee Flexion with Ankle Weight - 10 reps - 2  sets - 5 hold - 1x daily - 7x weekly           Seated Long Arc Quad with Ankle Weight - 10 reps - 2 sets - 5 hold - 1x daily - 7x weekly           Seated Hip Flexion March with Ankle Weights - 10 reps - 2 sets - 5 hold - 1x daily - 7x weekly           Standing Hip Abduction with Ankle Weight - 10 reps - 2 sets - 5 hold - 1x daily - 7x weekly           Standing Hip Extension with Ankle Weight - 10 reps - 2 sets - 5 hold - 1x daily - 7x weekly           Standing Hip Flexion with Ankle Weight - 10 reps - 2 sets - 5 hold - 1x daily - 7x weekly   Subjective Assessment - 10/31/19 1517    Subjective  Patient reports no pain and no falls/LOB since last session. He was able to walk about 1/4 mile outdoors this morning.    Pertinent History  Patient is a pleasant 79 year old male who presents to physical therapy for imbalance. 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 He did not receive TPA. Per previous documentation he additionally had episodes of dizziness. Was in the hospital for 13 days. Home health physical therapy was received for 4 weeks.  PMH includes DM, hx of stroke, HLD, HTN, bradycardia, syncope and collapse, COPD, Diabetic retinopathy, nonrheumatic aortic valve stenosis, CVA, CAD, DDD, and chronic pain syndrome. Patient reports 2-3 near falls a day.    Limitations  Lifting;Standing;Walking;House hold activities;Other (comment)    How long can you sit comfortably?  n/a    How long can you stand comfortably?  2 hours    How long can you walk comfortably?  needs to use stick,  tries to not use walking stick.    Diagnostic tests  Imaging: MRI: acute infarction affecting cerebellar vermis and medial aspect of both inferior cerebellar hemispheres. 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    Patient Stated Goals  to get to walking right.    Currently in Pain?  No/denies    Multiple Pain Sites  No  PT Education - 10/31/19 1516    Education Details  exercise technique, body mechanics, HEP    Person(s) Educated  Patient    Methods  Explanation;Demonstration;Tactile cues;Verbal cues;Handout    Comprehension  Verbalized understanding;Returned demonstration;Verbal cues required;Tactile cues required       PT Short Term Goals - 09/19/19 0730      PT SHORT TERM GOAL #1   Title  Patient will be independent in home exercise program to improve strength/mobility for better functional independence with ADLs.    Baseline  1/13: HEP given    Time  2    Period  Weeks    Status  New    Target Date  10/03/19        PT Long Term Goals - 09/19/19 0731      PT LONG TERM GOAL #1   Title  Patient will increase 10 meter walk test to >1.33m/s as to improve gait speed for better community ambulation and to reduce fall risk.    Baseline  09/19/19: 0.53 no AD    Time  8    Period  Weeks    Status  New    Target Date  11/13/19      PT LONG TERM GOAL #2   Title  Patient will increase Berg Balance score by > 6 points (47/56) to demonstrate decreased fall risk during functional activities.    Baseline  1/13: 41/56    Time  8    Period  Weeks    Status  New    Target Date  11/13/19      PT LONG TERM GOAL #3   Title  Patient will increase ABC scale score >80% to demonstrate better functional mobility and better confidence with ADLs.     Baseline  1/13: 28.1%    Time  8    Period  Weeks    Status  New    Target Date  11/13/19      PT LONG TERM GOAL #4   Title  Patient will increase BLE gross strength to 4+/5 as to improve functional strength for independent gait, increased standing tolerance and increased ADL ability.    Baseline  1/12: R grossly 3+/5 L grossly 4-/5    Time  8    Period  Weeks    Status  New    Target Date  11/13/19            Plan - 10/31/19 1629    Clinical Impression Statement  Patient presented with excellent motivation. He performed well with strength exercises, as displayed during standing hip abduction and extension exercises and weighted soldier walks. He continues to be challenged by coordination with moderate complexity tasks, yet is able to correct with Mod cueing. He is able to balance on unsteady surfaces with slight SUE support, and has LOB without UE support. Patient would benefit from further skilled PT intervention to continue strength improvements and improve coordination and stability in order to decrease fall risk and increase QOL.    Personal Factors and Comorbidities  Age;Comorbidity 3+;Education;Past/Current Experience;Social Background;Time since onset of injury/illness/exacerbation    Comorbidities  PMH includes DM, hx of stroke, HLD, HTN, bradycardia, syncope and collapse, COPD, Diabetic retinopathy, nonrheumatic aortic valve stenosis, CVA, CAD, DDD, and chronic pain syndrome.    Examination-Activity Limitations  Bend;Caring for Others;Carry;Dressing;Lift;Locomotion Level;Reach Overhead;Squat;Stairs;Stand;Toileting;Transfers    Examination-Participation Restrictions  Church;Cleaning;Community Activity;Personal Finances;Meal Prep;Laundry;Shop;Volunteer;Yard Work    Database administrator  Fair    PT Frequency  2x / week    PT Duration  8 weeks    PT Treatment/Interventions  ADLs/Self Care Home Management;Aquatic  Therapy;Biofeedback;Canalith Repostioning;Cryotherapy;Electrical Stimulation;Iontophoresis 4mg /ml Dexamethasone;Moist Heat;Traction;DME Instruction;Gait training;Stair training;Functional mobility training;Therapeutic activities;Therapeutic exercise;Balance training;Neuromuscular re-education;Cognitive remediation;Manual techniques;Orthotic Fit/Training;Patient/family education;Passive range of motion;Visual/perceptual remediation/compensation;Vestibular;Taping;Splinting;Energy conservation    PT Next Visit Plan  Continue balance exercises and incorporate head turns    PT Home Exercise Plan  see above    Consulted and Agree with Plan of Care  Patient       Patient will benefit from skilled therapeutic intervention in order to improve the following deficits and impairments:  Abnormal gait, Cardiopulmonary status limiting activity, Decreased activity tolerance, Decreased balance, Decreased knowledge of precautions, Decreased endurance, Decreased coordination, Decreased mobility, Decreased safety awareness, Difficulty walking, Decreased strength, Dizziness, Impaired flexibility, Impaired perceived functional ability, Impaired sensation, Impaired UE functional use, Impaired vision/preception, Postural dysfunction, Improper body mechanics  Visit Diagnosis: Other abnormalities of gait and mobility  Unsteadiness on feet  Muscle weakness (generalized)     Problem List Patient Active Problem List   Diagnosis Date Noted  . Bradycardia 03/19/2019  . Chest pain 12/20/2016  . HTN (hypertension) 12/20/2016  . HLD (hyperlipidemia) 12/20/2016  . Syncope and collapse 12/20/2016  . Uncontrolled type 2 diabetes mellitus with microalbuminuria ([place]) 08/21/2014    Florinda Marker, SPT  This entire session was performed under direct supervision and direction of a licensed therapist/therapist assistant . I have personally read, edited and approve of the note as written.  Janna Arch, PT, DPT   10/31/2019,  6:28 PM  [place] MAIN [facility] SERVICES [address] [place], [place], [address] Phone: [phone]   Fax:  [phone]  Name: Illias Argetsinger MRN: [id number] Date of Birth: 01/24/1941

## 2019-11-05 ENCOUNTER — Ambulatory Visit: Payer: Medicare Other

## 2019-11-07 ENCOUNTER — Ambulatory Visit: Payer: Medicare Other

## 2019-11-12 ENCOUNTER — Ambulatory Visit: Payer: Medicare Other

## 2019-11-14 ENCOUNTER — Ambulatory Visit: Payer: Medicare Other

## 2019-11-19 ENCOUNTER — Ambulatory Visit: Payer: Medicare Other | Attending: Neurology

## 2019-11-21 ENCOUNTER — Ambulatory Visit: Payer: Medicare Other

## 2019-11-26 ENCOUNTER — Ambulatory Visit: Payer: Medicare Other

## 2019-11-28 ENCOUNTER — Ambulatory Visit: Payer: Medicare Other

## 2019-12-03 ENCOUNTER — Ambulatory Visit: Payer: Medicare Other

## 2019-12-05 ENCOUNTER — Ambulatory Visit: Payer: Medicare Other

## 2019-12-11 ENCOUNTER — Ambulatory Visit: Payer: Medicare Other

## 2019-12-12 ENCOUNTER — Ambulatory Visit: Payer: Medicare Other

## 2019-12-13 ENCOUNTER — Ambulatory Visit: Payer: Medicare Other

## 2019-12-17 ENCOUNTER — Ambulatory Visit: Payer: Medicare Other

## 2019-12-20 ENCOUNTER — Ambulatory Visit: Payer: Medicare Other

## 2019-12-25 ENCOUNTER — Ambulatory Visit: Payer: Medicare Other

## 2019-12-27 ENCOUNTER — Ambulatory Visit: Payer: Medicare Other

## 2020-01-01 ENCOUNTER — Ambulatory Visit: Payer: Medicare Other

## 2020-01-03 ENCOUNTER — Ambulatory Visit: Payer: Medicare Other

## 2020-01-09 ENCOUNTER — Emergency Department
Admission: EM | Admit: 2020-01-09 | Discharge: 2020-01-10 | Disposition: A | Discharge status: Home / Self Care | Payer: Medicare Other | Attending: Emergency Medicine | Admitting: Emergency Medicine

## 2020-01-09 ENCOUNTER — Encounter: Payer: Self-pay | Admitting: Emergency Medicine

## 2020-01-09 ENCOUNTER — Other Ambulatory Visit: Payer: Self-pay

## 2020-01-09 ENCOUNTER — Emergency Department: Payer: Medicare Other

## 2020-01-09 DIAGNOSIS — Z87891 Personal history of nicotine dependence: Secondary | ICD-10-CM | POA: Diagnosis not present

## 2020-01-09 DIAGNOSIS — R531 Weakness: Secondary | ICD-10-CM | POA: Diagnosis present

## 2020-01-09 DIAGNOSIS — I10 Essential (primary) hypertension: Secondary | ICD-10-CM | POA: Diagnosis not present

## 2020-01-09 DIAGNOSIS — E119 Type 2 diabetes mellitus without complications: Secondary | ICD-10-CM | POA: Insufficient documentation

## 2020-01-09 DIAGNOSIS — I69393 Ataxia following cerebral infarction: Secondary | ICD-10-CM | POA: Diagnosis not present

## 2020-01-09 DIAGNOSIS — Z8673 Personal history of transient ischemic attack (TIA), and cerebral infarction without residual deficits: Secondary | ICD-10-CM | POA: Diagnosis not present

## 2020-01-09 DIAGNOSIS — R270 Ataxia, unspecified: Secondary | ICD-10-CM

## 2020-01-09 HISTORY — DX: Cerebral infarction, unspecified: I63.9

## 2020-01-09 LAB — COMPREHENSIVE METABOLIC PANEL
ALT: 19 U/L (ref 0–44)
AST: 18 U/L (ref 15–41)
Albumin: 3.9 g/dL (ref 3.5–5.0)
Alkaline Phosphatase: 129 U/L — ABNORMAL HIGH (ref 38–126)
Anion gap: 8 (ref 5–15)
BUN: 15 mg/dL (ref 8–23)
CO2: 27 mmol/L (ref 22–32)
Calcium: 9.3 mg/dL (ref 8.9–10.3)
Chloride: 101 mmol/L (ref 98–111)
Creatinine, Ser: 0.66 mg/dL (ref 0.61–1.24)
GFR calc Af Amer: >60 mL/min (ref >60)
GFR calc non Af Amer: >60 mL/min (ref >60)
Glucose, Bld: 294 mg/dL — ABNORMAL HIGH (ref 70–99)
Potassium: 3.9 mmol/L (ref 3.5–5.1)
Sodium: 136 mmol/L (ref 135–145)
Total Bilirubin: 1.6 mg/dL — ABNORMAL HIGH (ref 0.3–1.2)
Total Protein: 7.3 g/dL (ref 6.5–8.1)

## 2020-01-09 LAB — DIFFERENTIAL
Abs Immature Granulocytes: 0.02 10*3/uL (ref 0.00–0.07)
Basophils Absolute: 0.1 10*3/uL (ref 0.0–0.1)
Basophils Relative: 1 %
Eosinophils Absolute: 0.1 10*3/uL (ref 0.0–0.5)
Eosinophils Relative: 2 %
Immature Granulocytes: 0 %
Lymphocytes Relative: 26 %
Lymphs Abs: 1.8 10*3/uL (ref 0.7–4.0)
Monocytes Absolute: 0.7 10*3/uL (ref 0.1–1.0)
Monocytes Relative: 9 %
Neutro Abs: 4.5 10*3/uL (ref 1.7–7.7)
Neutrophils Relative %: 62 %

## 2020-01-09 LAB — CBC
HCT: 40.4 % (ref 39.0–52.0)
Hemoglobin: 13.7 g/dL (ref 13.0–17.0)
MCH: 32.6 pg (ref 26.0–34.0)
MCHC: 33.9 g/dL (ref 30.0–36.0)
MCV: 96.2 fL (ref 80.0–100.0)
Platelets: 209 10*3/uL (ref 150–400)
RBC: 4.2 MIL/uL — ABNORMAL LOW (ref 4.22–5.81)
RDW: 13.1 % (ref 11.5–15.5)
WBC: 7.2 10*3/uL (ref 4.0–10.5)
nRBC: 0 % (ref 0.0–0.2)

## 2020-01-09 LAB — GLUCOSE, CAPILLARY
Glucose-Capillary: 278 mg/dL — ABNORMAL HIGH (ref 70–99)
Glucose-Capillary: 246 mg/dL — ABNORMAL HIGH (ref 70–99)
Glucose-Capillary: 311 mg/dL — ABNORMAL HIGH (ref 70–99)

## 2020-01-09 LAB — APTT: aPTT: 25 seconds (ref 24–36)

## 2020-01-09 LAB — PROTIME-INR
INR: 1 (ref 0.8–1.2)
Prothrombin Time: 12.8 seconds (ref 11.4–15.2)

## 2020-01-09 MED ORDER — AMLODIPINE BESYLATE 5 MG PO TABS
10.0000 mg | ORAL_TABLET | Freq: Every day | ORAL | Status: DC
  Administered 2020-01-10: 10 mg via ORAL
  Filled 2020-01-09 (×2): qty 2

## 2020-01-09 MED ORDER — ONDANSETRON 4 MG PO TBDP
4.0000 mg | ORAL_TABLET | Freq: Once | ORAL | Status: AC
  Administered 2020-01-09: 4 mg via ORAL
  Filled 2020-01-09: qty 1

## 2020-01-09 MED ORDER — LATANOPROST 0.005 % OP SOLN
1.0000 [drp] | Freq: Every day | OPHTHALMIC | Status: DC
  Filled 2020-01-09: qty 2.5

## 2020-01-09 MED ORDER — INSULIN ASPART 100 UNIT/ML ~~LOC~~ SOLN
0.0000 [IU] | Freq: Every day | SUBCUTANEOUS | Status: DC
  Administered 2020-01-09: 4 [IU] via SUBCUTANEOUS
  Filled 2020-01-09: qty 1

## 2020-01-09 MED ORDER — ALBUTEROL SULFATE (2.5 MG/3ML) 0.083% IN NEBU: 3.0000 mL | INHALATION_SOLUTION | Freq: Four times a day (QID) | RESPIRATORY_TRACT | Status: DC | PRN

## 2020-01-09 MED ORDER — LISINOPRIL 10 MG PO TABS
40.0000 mg | ORAL_TABLET | Freq: Every day | ORAL | Status: DC
  Administered 2020-01-10: 40 mg via ORAL
  Filled 2020-01-09 (×2): qty 4

## 2020-01-09 MED ORDER — TIOTROPIUM BROMIDE MONOHYDRATE 18 MCG IN CAPS: 18.0000 ug | ORAL_CAPSULE | Freq: Every day | RESPIRATORY_TRACT | Status: DC

## 2020-01-09 MED ORDER — MAGNESIUM OXIDE 400 (241.3 MG) MG PO TABS
400.0000 mg | ORAL_TABLET | Freq: Every day | ORAL | Status: DC
  Administered 2020-01-10: 400 mg via ORAL
  Filled 2020-01-09 (×2): qty 1

## 2020-01-09 MED ORDER — INSULIN GLARGINE 100 UNIT/ML ~~LOC~~ SOLN
20.0000 [IU] | Freq: Every day | SUBCUTANEOUS | Status: DC
  Administered 2020-01-09: 20 [IU] via SUBCUTANEOUS
  Filled 2020-01-09 (×2): qty 0.2

## 2020-01-09 MED ORDER — ATORVASTATIN CALCIUM 20 MG PO TABS
40.0000 mg | ORAL_TABLET | Freq: Every day | ORAL | Status: DC
  Administered 2020-01-09: 40 mg via ORAL
  Filled 2020-01-09: qty 2

## 2020-01-09 MED ORDER — DULAGLUTIDE 1.5 MG/0.5ML ~~LOC~~ SOAJ: 1.5000 mg | SUBCUTANEOUS | Status: DC

## 2020-01-09 MED ORDER — INSULIN ASPART 100 UNIT/ML ~~LOC~~ SOLN
0.0000 [IU] | Freq: Three times a day (TID) | SUBCUTANEOUS | Status: DC
  Administered 2020-01-09 – 2020-01-10 (×2): 3 [IU] via SUBCUTANEOUS
  Filled 2020-01-09 (×2): qty 1

## 2020-01-09 MED ORDER — INSULIN ASPART 100 UNIT/ML ~~LOC~~ SOLN: 0.0000 [IU] | Freq: Three times a day (TID) | SUBCUTANEOUS | Status: DC

## 2020-01-09 MED ORDER — ASPIRIN EC 325 MG PO TBEC
325.0000 mg | DELAYED_RELEASE_TABLET | Freq: Every day | ORAL | Status: DC
  Administered 2020-01-10: 325 mg via ORAL
  Filled 2020-01-09 (×2): qty 1

## 2020-01-09 NOTE — TOC Initial Note (Signed)
Transition of Care [facility]) - Initial/Assessment Note    Patient Details  Name: Essa Handshoe MRN: [id number] Date of Birth: [birth year]  Transition of Care [facility]) CM/SW Contact:    Anselm Pancoast, RN Phone Number: 01/09/2020, 1:42 PM  Clinical Narrative:                 Spoke to patient and daughter at bedside and both are wanting patient to be admitted to SNF for rehab due to recent stroke. Patient experienced stroke while vacationing in [place] over the weekend and spent 2 days in the hospital before discharging back home with his daughter. Daughter states she did not realize how much assistance patient would be needing and she is not able to care for him at home. Writer discussed in detail the need for a 3 day stay to qualify for SNF and that patient currently has no medical need for admission. Discussed possibility of [place] with PT, OT, ST, RN and aide services and patient agrees and understands but is requesting possible placement at [facility] rehab if availability.   Expected Discharge Plan: [place]     Patient Goals and CMS Choice Patient states their goals for this hospitalization and ongoing recovery are:: Admit to rehab facility      Expected Discharge Plan and Services Expected Discharge Plan: [place]       Living arrangements for the past 2 months: Single Family Home                                      Prior Living Arrangements/Services Living arrangements for the past 2 months: Single Family Home Lives with:: Spouse(adult daughter lives close by and assists as needed) Patient language and need for interpreter reviewed:: Yes Do you feel safe going back to the place where you live?: Yes      Need for Family Participation in Patient Care: Yes (Comment) Care giver support system in place?: Yes (comment) Current home services: DME(walker, cane) Criminal Activity/Legal Involvement Pertinent to Current Situation/Hospitalization: No -  Comment as needed  Activities of Daily Living      Permission Sought/Granted Permission sought to share information with : Case Manager Permission granted to share information with : Yes, Verbal Permission Granted  Share Information with NAME: TOC Department           Emotional Assessment Appearance:: Appears stated age Attitude/Demeanor/Rapport: Complaining, Engaged Affect (typically observed): Agitated, Angry Orientation: : Oriented to Self, Oriented to Place, Oriented to  Time, Oriented to Situation Alcohol / Substance Use: Never Used Psych Involvement: No (comment)  Admission diagnosis:  r side weakness slurred speech Patient Active Problem List   Diagnosis Date Noted  . Bradycardia 03/19/2019  . Chest pain 12/20/2016  . HTN (hypertension) 12/20/2016  . HLD (hyperlipidemia) 12/20/2016  . Syncope and collapse 12/20/2016  . Uncontrolled type 2 diabetes mellitus with microalbuminuria ([place]) 08/21/2014   PCP:  Patient, No Pcp Per Pharmacy:   [facility] [place] - [address] [place] [address] Phone: [phone] Fax: [place], [place]. [place] [address] Phone: [phone] Fax: [phone]     Social Determinants of Health (SDOH) Interventions    Readmission Risk Interventions No flowsheet data found.

## 2020-01-09 NOTE — ED Notes (Signed)
Resumed car from [place] rn.  Pt in hallway bed.  Family at bedside. PT working with pt now.

## 2020-01-09 NOTE — ED Notes (Signed)
Pt sleeping. 

## 2020-01-09 NOTE — ED Notes (Signed)
Report off to chris rn  

## 2020-01-09 NOTE — Progress Notes (Signed)
PT Cancellation Note  Patient Details Name: Mathew Baker MRN: [id number] DOB: 12/15/1940   Cancelled Treatment:    Reason Eval/Treat Not Completed: Other (comment)(Pt out of room at this time for imaging, PT to initiate evaluation when patient is available.)   Lieutenant Diego PT, DPT 10:58 AM,01/09/20

## 2020-01-09 NOTE — ED Notes (Signed)
fsbs 311  Insulin given  Pt in hallway bed,awaiitng placement.  Pt alert.

## 2020-01-09 NOTE — Progress Notes (Signed)
Inpatient Diabetes Program Recommendations  AACE/ADA: New Consensus Statement on Inpatient Glycemic Control  Target Ranges:  Prepandial:   less than 140 mg/dL      Peak postprandial:   less than 180 mg/dL (1-2 hours)      Critically ill patients:  140 - 180 mg/dL   Results for Mathew Baker, Mathew Baker (MRN [id number]) as of 01/09/2020 13:49  Ref. Range 01/09/2020 09:56  Glucose-Capillary Latest Ref Range: 70 - 99 mg/dL 278 (H)  Results for Mathew Baker, Mathew Baker (MRN [id number]) as of 01/09/2020 13:49  Ref. Range 01/09/2020 10:04  Glucose Latest Ref Range: 70 - 99 mg/dL 294 (H)   Review of Glycemic Control  Diabetes history: DM2 Outpatient Diabetes medications: Trulicity 1.5 mg Qweek, Novolog 0-9 units TID with meals, Lantus 70 units QHS (Lantus not on med list but noted on PCP office note on 09/19/19) Current orders for Inpatient glycemic control: Novolog 0-9 units TID with meals, Trulicity 1.5 mg Qweek  Inpatient Diabetes Program Recommendations:    Insulin-Basal: Please consider ordering Lantus 20 units QHS (based on 99.8 kg x 0.2 units).  Insulin-Correction: Please consider adding Novolog 0-5 units QHS for bedtime correction.  Injectable: Please discontinue Trulicity while holding in the Emergency Room as it is not on hospital formulary.  HbgA1C: A1C in Care Everywhere was 8.7% on 09/19/19 indicating an average glucose of 203 mg/dl over the past 2-3 months.  NOTE: Noted in reviewing chart, patient has DM2 and takes DM medications outpatient, brought in by family for weakness and aphasia, and is currently holding in the Emergency Room for SNF placement. Noted on office visit note on 09/19/19 by Dr. Ouida Sills that patient is prescribed Lantus 70 units QHS, Novolog 0-9 units TID with meals, and Trulicity 1.5 mg Qweek (which was increased to 3 mg Qweek per telephone on 09/19/19). Patient's A1C was 8.7% on 09/19/19. Called patient's wife to inquire about outpatient DM medications. She states that she knows he takes  Lantus and a once a week shot but she is not sure about Novolog. Called Dr. Tonette Bihari office and was informed that patient is prescribed Lantus 70 units QHS, Novolog 0-9 units TID with meals, and Trulicity 3 mg Qweek.   Thanks, Barnie Alderman, RN, MSN, CDE Diabetes Coordinator Inpatient Diabetes Program [phone] (Team Pager from 8am to 5pm)

## 2020-01-09 NOTE — ED Triage Notes (Signed)
Pt to ER states he was seen in a hospital in [place], [place] on Sunday and told he had a stroke.  Pt states stayed in hospital for 2 days and then was released on his insistence.  Pt states today that his symptoms or mildly worse.  Pt states right sided weakness, slurred speech and difficulty swallowing.  Pt also states some of his symptoms are better.

## 2020-01-09 NOTE — TOC Progression Note (Signed)
Transition of Care [facility]) - Progression Note    Patient Details  Name: Sharmarke Cicio MRN: [id number] Date of Birth: February 15, 1941  Transition of Care [facility]) CM/SW [place], RN Phone Number: 01/09/2020, 4:03 PM  Clinical Narrative:    Met with patient and daughter at bedside to discuss discharge options. Patient agreed to [facility] for SNF and understand that patient will transfer in the morning due to need for SNF corporate approval. ED Nurse and EDP notified of discharge plans. Daughter is planning to outreach to [place] @ SNF to tour facility.   Expected Discharge Plan: [place]    Expected Discharge Plan and Services Expected Discharge Plan: [place]       Living arrangements for the past 2 months: Single Family Home                                       Social Determinants of Health (SDOH) Interventions    Readmission Risk Interventions No flowsheet data found.

## 2020-01-09 NOTE — NC FL2 (Signed)
[place] LEVEL OF CARE SCREENING TOOL     IDENTIFICATION  Patient Name: Mathew Baker Birthdate: 1941-03-20 Sex: male Admission Date (Current Location): 01/09/2020  [place] and [place] Number:  Engineering geologist and Address:  [facility], [address] [place], [place] [address]      Provider Number: [id number]  Attending Physician Name and Address:  Earleen Newport, MD  Relative Name and Phone Number:       Current Level of Care: Hospital Recommended Level of Care: [place] Prior Approval Number:    Date Approved/Denied:   PASRR Number: [id number]  Discharge Plan: SNF    Current Diagnoses: Patient Active Problem List   Diagnosis Date Noted  . Bradycardia 03/19/2019  . Chest pain 12/20/2016  . HTN (hypertension) 12/20/2016  . HLD (hyperlipidemia) 12/20/2016  . Syncope and collapse 12/20/2016  . Uncontrolled type 2 diabetes mellitus with microalbuminuria ([place]) 08/21/2014    Orientation RESPIRATION BLADDER Height & Weight     Self, Time, Situation, Place  Normal Continent Weight: 99.8 kg Height:  6\' 1"  (185.4 cm)  BEHAVIORAL SYMPTOMS/MOOD NEUROLOGICAL BOWEL NUTRITION STATUS      Continent Diet(Regular)  AMBULATORY STATUS COMMUNICATION OF NEEDS Skin   Limited Assist Verbally Normal                       Personal Care Assistance Level of Assistance  Bathing, Dressing Bathing Assistance: Limited assistance   Dressing Assistance: Limited assistance     Functional Limitations Info             SPECIAL CARE FACTORS FREQUENCY  PT (By licensed PT), OT (By licensed OT)     PT Frequency: 5xweek OT Frequency: 5xweek            Contractures      Additional Factors Info                  Current Medications (01/09/2020):  This is the current hospital active medication list Current Facility-Administered Medications  Medication Dose Route Frequency Provider Last Rate Last Admin   . albuterol (PROVENTIL) (2.5 MG/3ML) 0.083% nebulizer solution 3 mL  3 mL Inhalation QID PRN Earleen Newport, MD      . amLODipine (NORVASC) tablet 10 mg  10 mg Oral Daily Earleen Newport, MD      . aspirin EC tablet 325 mg  325 mg Oral Daily Earleen Newport, MD      . atorvastatin (LIPITOR) tablet 40 mg  40 mg Oral QHS Earleen Newport, MD      . insulin aspart (novoLOG) injection 0-5 Units  0-5 Units Subcutaneous QHS Earleen Newport, MD      . insulin aspart (novoLOG) injection 0-9 Units  0-9 Units Subcutaneous TID WC Earleen Newport, MD      . insulin glargine (LANTUS) injection 20 Units  20 Units Subcutaneous QHS Earleen Newport, MD      . latanoprost (XALATAN) 0.005 % ophthalmic solution 1 drop  1 drop Both Eyes QHS Earleen Newport, MD      . lisinopril (ZESTRIL) tablet 40 mg  40 mg Oral Daily Earleen Newport, MD      . magnesium oxide (MAG-OX) tablet 400 mg  400 mg Oral Daily Earleen Newport, MD      . tiotropium [facility]) inhalation capsule ([facility] use ONLY) 18 mcg  18 mcg Inhalation Daily Earleen Newport, MD  Current Outpatient Medications  Medication Sig Dispense Refill  . albuterol (PROVENTIL HFA;VENTOLIN HFA) 108 (90 Base) MCG/ACT inhaler Inhale 2 puffs into the lungs 4 (four) times daily as needed for wheezing or shortness of breath.    Marland Kitchen amLODipine (NORVASC) 10 MG tablet Take 10 mg by mouth daily.    Marland Kitchen aspirin 325 MG tablet Take 1 tablet (325 mg total) by mouth daily. 30 tablet 0  . atorvastatin (LIPITOR) 80 MG tablet Take 40 mg by mouth at bedtime.    . Dulaglutide (TRULICITY) 1.5 0000000 SOPN Inject 1.5 mg into the skin every 7 (seven) days.    Marland Kitchen glucose blood (PRECISION XTRA TEST STRIPS) test strip Use to test blood sugar 2 times daily as instructed. Dx: E11.65 400 each 3  . HYDROcodone-acetaminophen (NORCO/VICODIN) 5-325 MG tablet Take 1 tablet by mouth 2 (two) times a day.     . insulin aspart (NOVOLOG) 100 UNIT/ML  injection Inject 0-9 Units into the skin 3 (three) times daily with meals. 10 mL 11  . Insulin Pen Needle (CAREFINE PEN NEEDLES) 32G X 4 MM MISC Use 1x a day 100 each 2  . latanoprost (XALATAN) 0.005 % ophthalmic solution Place 1 drop into both eyes at bedtime.    Marland Kitchen lisinopril (ZESTRIL) 40 MG tablet Take 40 mg by mouth daily.    . Magnesium Oxide 420 MG TABS Take 420 mg by mouth daily.     . meclizine (ANTIVERT) 25 MG tablet Take 1 tablet (25 mg total) by mouth 3 (three) times daily as needed for dizziness. 30 tablet 0  . sildenafil (VIAGRA) 100 MG tablet Take 100 mg by mouth daily as needed for erectile dysfunction.    . tamsulosin (FLOMAX) 0.4 MG CAPS capsule Take 0.4 mg by mouth at bedtime.    Marland Kitchen tiotropium (SPIRIVA) 18 MCG inhalation capsule Place 18 mcg into inhaler and inhale daily.        Discharge Medications: Please see discharge summary for a list of discharge medications.  Relevant Imaging Results:  Relevant Lab Results:   Additional Information [id number]  Anselm Pancoast, RN

## 2020-01-09 NOTE — Evaluation (Signed)
Physical Therapy Evaluation Patient Details Name: Olivia Bagot MRN: [id number] DOB: [birth year] Today's Date: 01/09/2020   History of Present Illness  Pt is a 79 y.o. male with a PMH notable for Type 2 DM, COPD, HTN, CVA that presented to the ED for R sided weakness, slurred speech and difficulty swallowing.    Clinical Impression  Pt alert, oriented, throughout session did exhibit some impulsivity, complained of headache/nausea intermittently. Per family and pt, prior to arrival of the ED pt was independent for ADLs, ambulated without AD (did have a cane that he didn't use often), drives, no falls. Recently seen at a [facility] hospital for a CVA, came home and developed worsening symptoms.  Upon assessment the patient demonstrated RUE and RLE weakness compared to L, as well as impaired gross and fine motor of both extremities. R pronator drift also present. Pt also stated his speech as not returned to normal, family verified. Supine to sit from gurney (with HOB elevated) with supervision, immediately asked for emesis bag, but nausea resolved with time, vitals WFLs. Sit <> stand performed with minA and RW, pt unable to coordinate standing marching. The patient was able to ambulate to bathroom and back to the gurney (~74ft total) in ED with modAx2 with RW and close chair follow.  Pt exhibited R knee buckling, gait path deviations, impulsivity, decreased coordination of foot placement, scissoring intermittently. Poor awareness of RW management/ability to manage without assist. From standard commode height modAx2 to sit safely and rise.  Overall the patient demonstrated deficits (see "PT Problem List") that impede the patient's functional abilities, safety, and mobility and would benefit from skilled PT intervention. Recommendation is SNF due to acute decline in functional status and current level of assistance needed.     Follow Up Recommendations SNF    Equipment Recommendations  Other (comment)(TBD at next  venue of care)    Recommendations for Other Services OT consult;Speech consult     Precautions / Restrictions Precautions Precautions: Fall Restrictions Weight Bearing Restrictions: No      Mobility  Bed Mobility Overal bed mobility: Needs Assistance Bed Mobility: Supine to Sit     Supine to sit: Supervision;HOB elevated        Transfers Overall transfer level: Needs assistance Equipment used: Rolling walker (2 wheeled) Transfers: Sit to/from Stand Sit to Stand: Min assist;+2 physical assistance         General transfer comment: from elevated surface pt minA for steadying with use of RW. From standard commode, minAx2 and use of grab bar, pt very unsteady  Ambulation/Gait Ambulation/Gait assistance: Mod assist;+2 safety/equipment Gait Distance (Feet): 35 Feet Assistive device: Rolling walker (2 wheeled)       General Gait Details: Pt presented with R knee buckling, gait path deviations, impulsivity, decreased coordination of foot placement, scissoring intermittently. Poor awareness of RW management/ability to manage without assist.  Stairs            Wheelchair Mobility    Modified Rankin (Stroke Patients Only)       Balance Overall balance assessment: Needs assistance Sitting-balance support: Feet supported Sitting balance-Leahy Scale: Good       Standing balance-Leahy Scale: Poor Standing balance comment: at least 1 person support to maintain balance                             Pertinent Vitals/Pain Pain Assessment: No/denies pain    Home Living Family/patient expects to be discharged to:: Private residence  Living Arrangements: Spouse/significant other Available Help at Discharge: Family;Other (Comment);Available 24 hours/day(daughter lives next door) Type of Home: House Home Access: Level entry     Home Layout: One level Home Equipment: Shower seat - built in;Bedside commode;Walker - 2 wheels;Cane - single point;Toilet  riser      Prior Function Level of Independence: Independent with assistive device(s)         Comments: Pt reported no falls. prior to admission pt able to drive, walk with and without SPC, I for ADLs.     Hand Dominance   Dominant Hand: Left    Extremity/Trunk Assessment   Upper Extremity Assessment Upper Extremity Assessment: RUE deficits/detail;LUE deficits/detail RUE Deficits / Details: grossly 4-/5. pronator drift present RUE Sensation: WNL RUE Coordination: decreased fine motor;decreased gross motor LUE Deficits / Details: WFLs, grossly 5/5 LUE Sensation: WNL LUE Coordination: WNL    Lower Extremity Assessment Lower Extremity Assessment: RLE deficits/detail;LLE deficits/detail RLE Deficits / Details: grossly 3+/5 RLE Sensation: WNL RLE Coordination: decreased fine motor;decreased gross motor LLE Deficits / Details: WFLs LLE Sensation: WNL LLE Coordination: WNL    Cervical / Trunk Assessment Cervical / Trunk Assessment: Normal  Communication   Communication: No difficulties  Cognition Arousal/Alertness: Awake/alert Behavior During Therapy: WFL for tasks assessed/performed Overall Cognitive Status: Within Functional Limits for tasks assessed                                 General Comments: some impulsivity noted but easily redirected      General Comments      Exercises Other Exercises Other Exercises: Pt ambulated to standard commode in ED, modAx2 with RW to maintain stafety with close chair follow. modAx2 to land safely on commode, minAx2 to stand from low surface.   Assessment/Plan    PT Assessment Patient needs continued PT services  PT Problem List Decreased strength;Decreased mobility;Decreased safety awareness;Decreased range of motion;Decreased coordination;Decreased knowledge of precautions;Decreased activity tolerance;Decreased balance;Decreased knowledge of use of DME       PT Treatment Interventions DME  instruction;Therapeutic exercise;Gait training;Stair training;Neuromuscular re-education;Balance training;Functional mobility training;Therapeutic activities;Patient/family education    PT Goals (Current goals can be found in the Care Plan section)  Acute Rehab PT Goals Patient Stated Goal: to return to PLOF PT Goal Formulation: With patient Time For Goal Achievement: 01/23/20 Potential to Achieve Goals: Good    Frequency Min 2X/week   Barriers to discharge Decreased caregiver support      Co-evaluation               AM-PAC PT "6 Clicks" Mobility  Outcome Measure Help needed turning from your back to your side while in a flat bed without using bedrails?: A Little Help needed moving from lying on your back to sitting on the side of a flat bed without using bedrails?: A Little Help needed moving to and from a bed to a chair (including a wheelchair)?: A Lot Help needed standing up from a chair using your arms (e.g., wheelchair or bedside chair)?: A Lot Help needed to walk in hospital room?: A Lot Help needed climbing 3-5 steps with a railing? : Total 6 Click Score: 13    End of Session Equipment Utilized During Treatment: Gait belt Activity Tolerance: Patient tolerated treatment well Patient left: in bed;with family/visitor present(in ED hallway) Nurse Communication: Mobility status PT Visit Diagnosis: Other abnormalities of gait and mobility (R26.89);Muscle weakness (generalized) (M62.81);Difficulty in walking, not elsewhere classified (R26.2);Unsteadiness  on feet (R26.81)    Time: [id number] PT Time Calculation (min) (ACUTE ONLY): 38 min   Charges:   PT Evaluation $PT Eval Moderate Complexity: 1 Mod PT Treatments $Therapeutic Exercise: 23-37 mins       Lieutenant Diego PT, DPT 4:21 PM,01/09/20

## 2020-01-09 NOTE — TOC Progression Note (Signed)
Transition of Care [facility]) - Progression Note    Patient Details  Name: Shaft Tia MRN: [id number] Date of Birth: May 20, 1941  Transition of Care [facility]) CM/SW [place], RN Phone Number: 01/09/2020, 2:50 PM  Clinical Narrative:    Secure email sent to Steva Ready @ [facility] to discuss possibilities for rehab options at patients request.   Discussed case with advanced home care, Corene Cornea, for possible discharge home with home health PT, OT and nursing.   Waiting on PT assessment for recommendations.    Expected Discharge Plan: [place]    Expected Discharge Plan and Services Expected Discharge Plan: [place]       Living arrangements for the past 2 months: Single Family Home                                       Social Determinants of Health (SDOH) Interventions    Readmission Risk Interventions No flowsheet data found.

## 2020-01-09 NOTE — ED Provider Notes (Signed)
[facility] Emergency Department Provider Note       Time seen: ----------------------------------------- 10:17 AM on 01/09/2020 -----------------------------------------   I have reviewed the triage vital signs and the nursing notes.  HISTORY   Chief Complaint Weakness and Aphasia    HPI Mathew Baker is a 79 y.o. male with a history of diabetes, hyperlipidemia, hypertension, pneumonia, CVA who presents to the ED for right-sided weakness, slurred speech and difficulty swallowing.  Patient states some of his symptoms are better.  He had these symptoms recently in [place] due to a CVA.  He currently has some nausea as well as the weakness.  Daughter reports he is unable to take care of himself.  He was unable to walk when he left due to the stroke.  He tries to walk with a walker.  Past Medical History:  Diagnosis Date  . Chronic airway obstruction ([place])   . Degeneration of intervertebral disc of lumbar region   . Diabetes mellitus without complication ([place])   . Essential hypertension, benign   . Hyperlipidemia   . Hypertension   . Osteoarthritis of lower extremity   . Pneumonia, organism unspecified(486)   . PSA elevation 07/16/2013   Normal  . Stroke [facility])     Patient Active Problem List   Diagnosis Date Noted  . Bradycardia 03/19/2019  . Chest pain 12/20/2016  . HTN (hypertension) 12/20/2016  . HLD (hyperlipidemia) 12/20/2016  . Syncope and collapse 12/20/2016  . 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 CATARACT EXTRACTION W/ INTRAOCULAR LENS  IMPLANT, BILATERAL    . COLONOSCOPY  2006   Normal: Repeat in 10 yrs    Allergies Patient has no known allergies.  Social History Social History   Tobacco Use  . Smoking status: Former Smoker    Quit date: 09/06/1958    Years since quitting: 61.3  . Smokeless tobacco: Never Used  Substance  Use Topics  . Alcohol use: No    Alcohol/week: 0.0 standard drinks  . Drug use: No    Review of Systems Constitutional: Negative for fever. Cardiovascular: Negative for chest pain. Respiratory: Negative for shortness of breath. Gastrointestinal: Negative for abdominal pain, positive for nausea Musculoskeletal: Negative for back pain. Skin: Negative for rash. Neurological: Positive for weakness, difficulty walking  All systems negative/normal/unremarkable except as stated in the HPI  ____________________________________________   PHYSICAL EXAM:  VITAL SIGNS: ED Triage Vitals  Enc Vitals Group     BP 01/09/20 0953 (!) 161/75     Pulse Rate 01/09/20 0953 66     Resp 01/09/20 0953 20     Temp 01/09/20 0953 98.2 F (36.8 C)     Temp Source 01/09/20 0953 Oral     SpO2 01/09/20 0953 97 %     Weight 01/09/20 0952 220 lb (99.8 kg)     Height 01/09/20 0952 6\' 1"  (1.854 m)     Head Circumference --      Peak Flow --      Pain Score 01/09/20 1001 0     Pain Loc --      Pain Edu? --      Excl. in [place]? --     Constitutional: Alert and oriented. Well appearing and in no distress. Eyes: Conjunctivae are normal. Normal extraocular movements. ENT      Head: Normocephalic and atraumatic.      Nose: No congestion/rhinnorhea.  Mouth/Throat: Mucous membranes are moist.      Neck: No stridor. Cardiovascular: Normal rate, regular rhythm. No murmurs, rubs, or gallops. Respiratory: Normal respiratory effort without tachypnea nor retractions. Breath sounds are clear and equal bilaterally. No wheezes/rales/rhonchi. Gastrointestinal: Soft and nontender. Normal bowel sounds Musculoskeletal: Nontender with normal range of motion in extremities. No lower extremity tenderness nor edema. Neurologic:  Normal speech and language.  Right-sided weakness is noted Skin:  Skin is warm, dry and intact. No rash noted. Psychiatric: Mood and affect are normal. Speech and behavior are normal.   ____________________________________________  EKG: Interpreted by me.  Sinus rhythm with PVCs, left anterior fascicular block, normal QT  ____________________________________________  ED COURSE:  As part of my medical decision making, I reviewed the following data within the [place] History obtained from family if available, nursing notes, old chart and ekg, as well as notes from prior ED visits. Patient presented for weakness and difficulty walking and potentially for skilled nursing facility placement, we will assess with labs and imaging as indicated at this time.   Procedures  Mathew Baker was evaluated in Emergency Department on 01/09/2020 for the symptoms described in the history of present illness. He was evaluated in the context of the global COVID-19 pandemic, which necessitated consideration that the patient might be at risk for infection with the SARS-CoV-2 virus that causes COVID-19. Institutional protocols and algorithms that pertain to the evaluation of patients at risk for COVID-19 are in a state of rapid change based on information released by regulatory bodies including the CDC and federal and state organizations. These policies and algorithms were followed during the patient's care in the ED.  ____________________________________________   LABS (pertinent positives/negatives)  Labs Reviewed  GLUCOSE, CAPILLARY - Abnormal; Notable for the following components:      Result Value   Glucose-Capillary 278 (*)    All other components within normal limits  CBC - Abnormal; Notable for the following components:   RBC 4.20 (*)    All other components within normal limits  COMPREHENSIVE METABOLIC PANEL - Abnormal; Notable for the following components:   Glucose, Bld 294 (*)    Alkaline Phosphatase 129 (*)    Total Bilirubin 1.6 (*)    All other components within normal limits  PROTIME-INR  APTT  DIFFERENTIAL  I-STAT CREATININE, ED  CBG MONITORING, ED     RADIOLOGY  CT head IMPRESSION: 1. No acute intracranial findings. No compelling findings of acute stroke. If there is clinical suspicion for occult acute or subacute CVA, MRI may provide further helpful imaging guidance. 2. Periventricular white matter and corona radiata hypodensities favor chronic ischemic microvascular white matter disease. 3. Remote small lacunar infarct in the right external capsule. 4. Encephalomalacia along the inferior cerebellar vermis and paramedian adjacent inferior cerebellum bilaterally, with region of involvement similar to that shown on the 03/21/2019 MRI. 5. Atherosclerosis. ____________________________________________   DIFFERENTIAL DIAGNOSIS   CVA, TIA, ataxia, dehydration, electrolyte abnormality, occult infection  FINAL ASSESSMENT AND PLAN  Subacute CVA, ataxia   Plan: The patient had presented for skilled nursing facility placement. Patient's labs do not reveal any acute process. Patient's imaging also did not reveal any acute process.  We will attempt to place into a skilled rehab facility.  He will be boarded in our ER.   Laurence Aly, MD    Note: This note was generated in part or whole with voice recognition software. Voice recognition is usually quite accurate but there are transcription errors  that can and very often do occur. I apologize for any typographical errors that were not detected and corrected.     Earleen Newport, MD 01/09/20 [phone]

## 2020-01-09 NOTE — ED Notes (Signed)
meds given  Family with pt in hallway bed.  Pt alert

## 2020-01-09 NOTE — ED Notes (Signed)
Pt ate dinner tray.

## 2020-01-09 NOTE — ED Notes (Signed)
fsbs 246

## 2020-01-09 NOTE — ED Notes (Signed)
Lab tubes drawn: rainbow

## 2020-01-09 NOTE — ED Notes (Signed)
Case manager here talking with pt and family.

## 2020-01-09 NOTE — ED Triage Notes (Addendum)
First nurse note-here for weakness and nausea. Pt was dx with stroke over the weekend in [facility].  Feels like not getting better. Daughter reports pt is unable to take care of himself.  Not worse sx than when left [place].  Was unable to walk when he left r/t stroke.

## 2020-01-09 NOTE — TOC Progression Note (Addendum)
Transition of Care [facility]) - Progression Note    Patient Details  Name: Mathew Baker MRN: [id number] Date of Birth: 1941/01/05  [facility] Care [facility]) CM/SW [place], RN Phone Number: 01/09/2020, 3:22 PM  Clinical Narrative:    Confirmed patient is active with [facility] and not [facility]. Spoke to [employer] @ [facility] who agreed to 3 day waiver and will review with her corporate once PT assessment completed. PASSR completed-[phone].    Expected Discharge Plan: [place]    Expected Discharge Plan and Services Expected Discharge Plan: [place]       Living arrangements for the past 2 months: Single Family Home                                       Social Determinants of Health (SDOH) Interventions    Readmission Risk Interventions No flowsheet data found.

## 2020-01-10 DIAGNOSIS — I69393 Ataxia following cerebral infarction: Secondary | ICD-10-CM | POA: Diagnosis not present

## 2020-01-10 LAB — HEMOGLOBIN A1C
Hgb A1c MFr Bld: 9.4 % — ABNORMAL HIGH (ref 4.8–5.6)
Mean Plasma Glucose: 223.08 mg/dL

## 2020-01-10 LAB — GLUCOSE, CAPILLARY: Glucose-Capillary: 206 mg/dL — ABNORMAL HIGH (ref 70–99)

## 2020-01-10 NOTE — ED Notes (Signed)
Received call back from SW pt to go once After visit is faxed but not until 11am.   Informed SW that pt's daughter was informed pt could come now and that his room was ready

## 2020-01-10 NOTE — TOC Transition Note (Addendum)
Transition of Care [facility]) - CM/SW Discharge Note   Patient Details  Name: Mathew Baker MRN: [id number] Date of Birth: December 20, 1940  [facility] CM/SW Contact:  Ova Freshwater Phone Number: [phone] 01/10/2020, 9:24 AM   Clinical Narrative:     Patient will discharge to [facility], his daughter will provide transport.  Report# [phone], Room# 42B.  EDP and ED Staff notified.  ED Secretary will fax AVS to [facility] [phone].       Patient Goals and CMS Choice Patient states their goals for this hospitalization and ongoing recovery are:: Admit to rehab facility      Discharge Placement                       Discharge Plan and Services                                     Social Determinants of Health (SDOH) Interventions     Readmission Risk Interventions No flowsheet data found.

## 2020-01-10 NOTE — ED Notes (Signed)
SW at bedside updating pt and family

## 2020-01-10 NOTE — ED Notes (Signed)
SW at bedside and states once [place] receives after visit summary we can get pt together and have taken to [facility].  SW will let this RN know as soon as that is completed.

## 2020-01-10 NOTE — ED Provider Notes (Signed)
-----------------------------------------   9:41 AM on 01/10/2020 -----------------------------------------  Patient has been accepted to [facility], remains calm and comfortable here in the ED.   Blake Divine, MD 01/10/20 [phone]

## 2020-01-10 NOTE — ED Notes (Signed)
Pt provided with breakfast tray.

## 2020-01-10 NOTE — ED Notes (Signed)
Messaged Andreas Newport RN to find out plan for pt and when he would be able to be transported to [facility]

## 2020-01-10 NOTE — ED Notes (Signed)
Received call pt is good to go. Pt's daughter to pull car around and we will bring pt out in wheelchair.

## 2020-01-10 NOTE — ED Notes (Signed)
Called SW to find out update and status on pt going to SNF, Left message

## 2020-01-10 NOTE — ED Notes (Signed)
Family at bedside. 

## 2020-01-10 NOTE — ED Notes (Signed)
SW to come and speak with family and update

## 2020-01-11 NOTE — TOC Progression Note (Signed)
Transition of Care [facility]) - Progression Note    Patient Details  Name: Mathew Baker MRN: [id number] Date of Birth: 01-03-1941  [facility]) CM/SW Contact  Shelbie Ammons, RN Phone Number: 01/11/2020, 11:35 AM  Clinical Narrative:  RNCM received message through [facility] dept that patient's daughter had called in and reported that she was unable to leave her father at the facility due to "lock down" and has chosen to take him home and would like for home health to be set up.   Returned call to daughter Ruffin Frederick and confirmed that she indeed wanted [facility] set up which she was able to confirm. She was agreeable to any agency at this point. Placed call to [facility] with [facility] and they are able to accept referral.   Called Dr. Charna Archer in ED who briefly saw patient while he was still in the ED. He was agreeable to place orders and Corene Cornea was en route to assist with those orders.      Expected Discharge Plan: [place]    Expected Discharge Plan and Services Expected Discharge Plan: [place]       Living arrangements for the past 2 months: Single Family Home                                       Social Determinants of Health (SDOH) Interventions    Readmission Risk Interventions No flowsheet data found.

## 2020-01-15 ENCOUNTER — Other Ambulatory Visit: Payer: Self-pay | Admitting: Neurology

## 2020-01-15 DIAGNOSIS — I6930 Unspecified sequelae of cerebral infarction: Secondary | ICD-10-CM

## 2020-01-15 DIAGNOSIS — Z8673 Personal history of transient ischemic attack (TIA), and cerebral infarction without residual deficits: Secondary | ICD-10-CM

## 2020-01-25 ENCOUNTER — Ambulatory Visit: Payer: Medicare Other

## 2020-02-11 ENCOUNTER — Ambulatory Visit
Admission: RE | Admit: 2020-02-11 | Discharge: 2020-02-11 | Disposition: A | Discharge status: Home / Self Care | Payer: Medicare Other | Source: Ambulatory Visit | Attending: Neurology | Admitting: Neurology

## 2020-02-11 DIAGNOSIS — Z8673 Personal history of transient ischemic attack (TIA), and cerebral infarction without residual deficits: Secondary | ICD-10-CM | POA: Insufficient documentation

## 2020-02-11 DIAGNOSIS — I6930 Unspecified sequelae of cerebral infarction: Secondary | ICD-10-CM

## 2020-02-18 ENCOUNTER — Other Ambulatory Visit: Payer: Self-pay

## 2020-02-18 ENCOUNTER — Other Ambulatory Visit
Admission: RE | Admit: 2020-02-18 | Discharge: 2020-02-18 | Disposition: A | Discharge status: Home / Self Care | Payer: Medicare Other | Source: Ambulatory Visit | Attending: Cardiology | Admitting: Cardiology

## 2020-02-18 DIAGNOSIS — Z20822 Contact with and (suspected) exposure to covid-19: Secondary | ICD-10-CM | POA: Insufficient documentation

## 2020-02-18 DIAGNOSIS — Z01812 Encounter for preprocedural laboratory examination: Secondary | ICD-10-CM | POA: Diagnosis present

## 2020-02-18 LAB — SARS CORONAVIRUS 2 (TAT 6-24 HRS): SARS Coronavirus 2: NEGATIVE

## 2020-02-19 ENCOUNTER — Other Ambulatory Visit: Payer: Medicare Other

## 2020-02-21 ENCOUNTER — Encounter
Admission: RE | Disposition: A | Discharge status: Home / Self Care | Payer: Self-pay | Source: Home / Self Care | Attending: Cardiology

## 2020-02-21 ENCOUNTER — Ambulatory Visit
Admission: RE | Admit: 2020-02-21 | Discharge: 2020-02-21 | Disposition: A | Discharge status: Home / Self Care | Payer: Medicare Other | Attending: Cardiology | Admitting: Cardiology

## 2020-02-21 ENCOUNTER — Other Ambulatory Visit: Payer: Self-pay

## 2020-02-21 ENCOUNTER — Encounter: Payer: Self-pay | Admitting: Cardiology

## 2020-02-21 DIAGNOSIS — E785 Hyperlipidemia, unspecified: Secondary | ICD-10-CM | POA: Insufficient documentation

## 2020-02-21 DIAGNOSIS — Z7902 Long term (current) use of antithrombotics/antiplatelets: Secondary | ICD-10-CM | POA: Diagnosis not present

## 2020-02-21 DIAGNOSIS — Z794 Long term (current) use of insulin: Secondary | ICD-10-CM | POA: Insufficient documentation

## 2020-02-21 DIAGNOSIS — R55 Syncope and collapse: Secondary | ICD-10-CM | POA: Diagnosis not present

## 2020-02-21 DIAGNOSIS — I350 Nonrheumatic aortic (valve) stenosis: Secondary | ICD-10-CM | POA: Diagnosis not present

## 2020-02-21 DIAGNOSIS — Z7982 Long term (current) use of aspirin: Secondary | ICD-10-CM | POA: Diagnosis not present

## 2020-02-21 DIAGNOSIS — E119 Type 2 diabetes mellitus without complications: Secondary | ICD-10-CM | POA: Diagnosis not present

## 2020-02-21 DIAGNOSIS — I639 Cerebral infarction, unspecified: Secondary | ICD-10-CM | POA: Diagnosis present

## 2020-02-21 DIAGNOSIS — J449 Chronic obstructive pulmonary disease, unspecified: Secondary | ICD-10-CM | POA: Diagnosis not present

## 2020-02-21 DIAGNOSIS — I4891 Unspecified atrial fibrillation: Secondary | ICD-10-CM | POA: Diagnosis not present

## 2020-02-21 DIAGNOSIS — I10 Essential (primary) hypertension: Secondary | ICD-10-CM | POA: Insufficient documentation

## 2020-02-21 DIAGNOSIS — I779 Disorder of arteries and arterioles, unspecified: Secondary | ICD-10-CM | POA: Diagnosis not present

## 2020-02-21 DIAGNOSIS — Z79899 Other long term (current) drug therapy: Secondary | ICD-10-CM | POA: Diagnosis not present

## 2020-02-21 HISTORY — PX: LOOP RECORDER INSERTION: EP1214

## 2020-02-21 SURGERY — LOOP RECORDER INSERTION
Anesthesia: LOCAL

## 2020-02-21 MED ORDER — LIDOCAINE HCL (PF) 1 % IJ SOLN
INTRAMUSCULAR | Status: DC | PRN
  Administered 2020-02-21: 20 mL

## 2020-02-21 MED ORDER — LIDOCAINE-EPINEPHRINE (PF) 1 %-1:200000 IJ SOLN
INTRAMUSCULAR | Status: AC
  Filled 2020-02-21: qty 20

## 2020-02-21 SURGICAL SUPPLY — 2 items
LOOP REVEAL LINQSYS (Prosthesis & Implant Heart) ×2 IMPLANT
PACK LOOP INSERTION (CUSTOM PROCEDURE TRAY) ×2 IMPLANT

## 2020-02-21 NOTE — Discharge Instructions (Signed)
Implantable Loop Recorder Placement, Care After This sheet gives you information about how to care for yourself after your procedure. Your health care provider may also give you more specific instructions. If you have problems or questions, contact your health care provider. What can I expect after the procedure? After the procedure, it is common to have:  Soreness or discomfort near the incision.  Some swelling or bruising near the incision. Follow these instructions at home: Incision care   Follow instructions from your health care provider about how to take care of your incision. Make sure you: ? Wash your hands with soap and water before you change your bandage (dressing). If soap and water are not available, use hand sanitizer. ? Leave dressing on until day of follow up appointment ? Leave stitches (sutures), skin glue, or adhesive strips in place. These skin closures may need to stay in place for 2 weeks or longer. If adhesive strip edges start to loosen and curl up, you may trim the loose edges. Do not remove adhesive strips completely unless your health care provider tells you to do that.  Check your incision area every day for signs of infection. Check for: ? Redness, swelling, or pain. ? Fluid or blood. ? Warmth. ? Pus or a bad smell.  You may shower tomorrow Activity   Return to your normal activities as told by your health care provider. Ask your health care provider what activities are safe for you.  Do not drive for 24 hours if you were given a sedative during your procedure. General instructions  Follow instructions from your health care provider about how to manage your implantable loop recorder and transmit the information. Learn how to activate a recording if this is necessary for your type of device.  Do not go through a metal detection gate, and do not let someone hold a metal detector over your chest. Show your ID card.  Do not have an MRI unless you check  with your health care provider first.  Take over-the-counter and prescription medicines only as told by your health care provider.  Keep all follow-up visits as told by your health care provider. This is important. Contact a health care provider if:  You have redness, swelling, or pain around your incision.  You have a fever.  You have pain that is not relieved by your pain medicine.  You have triggered your device because of fainting (syncope) or because of a heartbeat that feels like it is racing, slow, fluttering, or skipping (palpitations). Get help right away if you have:  Chest pain.  Difficulty breathing. Summary  After the procedure, it is common to have soreness or discomfort near the incision.  Change your dressing as told by your health care provider.  Follow instructions from your health care provider about how to manage your implantable loop recorder and transmit the information.  Keep all follow-up visits as told by your health care provider. This is important. This information is not intended to replace advice given to you by your health care provider. Make sure you discuss any questions you have with your health care provider. Document Released: 08/04/2015 Document Revised: 10/08/2017 Document Reviewed: 10/08/2017 Elsevier Patient Education  2020 [employer].

## 2020-05-13 ENCOUNTER — Other Ambulatory Visit: Payer: Self-pay

## 2020-05-13 ENCOUNTER — Ambulatory Visit: Payer: Medicare Other | Attending: Internal Medicine | Admitting: Physical Therapy

## 2020-05-13 ENCOUNTER — Encounter: Payer: Self-pay | Admitting: Physical Therapy

## 2020-05-13 DIAGNOSIS — R2681 Unsteadiness on feet: Secondary | ICD-10-CM

## 2020-05-13 DIAGNOSIS — R2689 Other abnormalities of gait and mobility: Secondary | ICD-10-CM | POA: Diagnosis not present

## 2020-05-13 DIAGNOSIS — M6281 Muscle weakness (generalized): Secondary | ICD-10-CM | POA: Insufficient documentation

## 2020-05-13 NOTE — Therapy (Addendum)
Mathew Baker MAIN [facility] SERVICES [address] [place], [place], [address] Phone: [phone]   Fax:  [phone]  Physical Therapy Evaluation  Patient Details  Name: Mathew Baker MRN: [id number] Date of Birth: 1940-10-21 Referring Provider (PT): Jennings Books, MD   Encounter Date: 05/13/2020   PT End of Session - 05/13/20 1420    Visit Number 1    Number of Visits 17    Date for PT Re-Evaluation 07/08/20    PT Start Time 1410    PT Stop Time 1455    PT Time Calculation (min) 45 min    Equipment Utilized During Treatment Gait belt    Activity Tolerance Patient tolerated treatment well    Behavior During Therapy [facility] for tasks assessed/performed           Past Medical History:  Diagnosis Date  . Chronic airway obstruction ([place])   . Degeneration of intervertebral disc of lumbar region   . Diabetes mellitus without complication ([place])   . Essential hypertension, benign   . Hyperlipidemia   . Hypertension   . Osteoarthritis of lower extremity   . Pneumonia, organism unspecified(486)   . PSA elevation 07/16/2013   Normal  . Stroke [facility])     Past Surgical History:  Procedure Laterality Date  . CATARACT EXTRACTION W/ INTRAOCULAR LENS  IMPLANT, BILATERAL    . COLONOSCOPY  2006   Normal: Repeat in 10 yrs  . LOOP RECORDER INSERTION N/A 02/21/2020   Procedure: LOOP RECORDER INSERTION;  Surgeon: Isaias Cowman, MD;  Location: [place] [facility];  Service: Cardiovascular;  Laterality: N/A;    There were no vitals filed for this visit.    Subjective Assessment - 05/13/20 1510    Subjective Pateint presents to PT with balance deficit and no pain .    Pertinent History Patient is using a rollator at home and for walking outdoors. He has no falls but feels unsteady. He had a TIA in april/May and wants to improve his balance.    Limitations Lifting;Standing;Walking;House hold activities;Other (comment)    How long can you sit  comfortably? n/a    How long can you stand comfortably? 2 hours    How long can you walk comfortably? needs to use stick, tries to not use walking stick.    Diagnostic tests Imaging: MRI: acute infarction affecting cerebellar vermis and medial aspect of both inferior cerebellar hemispheres. 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    Patient Stated Goals to get to walking right.    Currently in Pain? No/denies    Multiple Pain Sites No              [facility] PT Assessment - 05/13/20 0001      Assessment   Referring Provider (PT) Jennings Books, MD    Onset Date/Surgical Date 05/09/20    Hand Dominance Left      Precautions   Precautions None      Restrictions   Weight Bearing Restrictions No      Balance Screen   Has the patient fallen in the past 6 months No    Has the patient had a decrease in activity level because of a fear of falling?  Yes    Is the patient reluctant to leave their home because of a fear of falling?  No  [place] Private residence    Living Arrangements Spouse/significant other    Available Help at Discharge Family    Type of [place] Access Level entry    [place] One level      Prior Function   Level of Independence Independent    Leisure walk, jog, fish,       Furniture conservator/restorer   Sit to Stand Able to stand without using hands and stabilize independently    Standing Unsupported Able to stand safely 2 minutes    Sitting with Back Unsupported but Feet Supported on Floor or Stool Able to sit safely and securely 2 minutes    Stand to Sit Sits safely with minimal use of hands    Transfers Able to transfer safely, minor use of hands    Standing Unsupported with Eyes Closed Able to stand 10 seconds with supervision     Standing Unsupported with Feet Together Able to place feet together independently and stand for 1 minute with supervision    From Standing, Reach Forward with Outstretched Arm Can reach forward >12 cm safely (5")    From Standing Position, Pick up Object from [place] to pick up shoe, needs supervision    From Standing Position, Turn to Look Behind Over each Shoulder Looks behind one side only/other side shows less weight shift    Turn 360 Degrees Able to turn 360 degrees safely but slowly    Standing Unsupported, Alternately Place Feet on Step/Stool Able to stand independently and complete 8 steps >20 seconds    Standing Unsupported, One Foot in [employer] balance while stepping or standing    Standing on One Leg Unable to try or needs assist to prevent fall    Total Score 40            PAIN: None  POSTURE:WNL  PROM/AROM: PROM BUE:WNL AROM BUE:WNL PROM BLE:WNL AROM BLE:WNL  STRENGTH:  Graded on a 0-5 scale Muscle Group Left Right                          Hip Flex 4/5 4/5  Hip Abd 4/5 4/5              Knee Flex /55 5/5  Knee Ext 5/5 5/5  Ankle DF 4/5 4/5  Ankle PF 4/5 4/5   SENSATION:  BUE : RUE wrist and hand      SOMATOSENSORY:  Any N & T in extremities or weakness: reports :         Sensation           Intact      Diminished         Absent  Light touch  RUE hand and forearm                               FUNCTIONAL MOBILITY: sit to stand with UE support Bed mobility MI   BALANCE:40/56 berg balance   GAIT: patient ambulates with rollator and MI intermediate distance  OUTCOME MEASURES: TEST Outcome Interpretation      10 meter walk test    1.13`             m/s <1.0 m/s indicates increased risk for falls; limited community ambulator          Berg Balance Assessment 40/56 <36/56 (100% risk for  falls), 37-45 (80% risk for falls); 46-51 (>50% risk for falls); 52-55 (lower risk <25% of falls)        Treatment; Corner standing with modified  tandem stand x 1 min x 2 sets Corner standing , feet together and head turns x 1 min x 2 sets Patient performed with instruction, verbal cues, tactile cues of therapist: goal: increase tissue extensibility, promote proper posture, improve mobility           Objective measurements completed on examination: See above findings.               PT Education - 05/13/20 1417    Education Details HEP    Person(s) Educated Patient    Methods Explanation    Comprehension Verbalized understanding            PT Short Term Goals - 05/13/20 1617      PT SHORT TERM GOAL #1   Title Patient will be independent in home exercise program to improve strength/mobility for better functional independence with ADLs.    Baseline given beginning balance exercises    Time 4    Period Weeks    Status New    Target Date 06/10/20      PT SHORT TERM GOAL #2   Title Patient will improve FOTO score to indicate improved functional mobility    Time 4    Period Weeks    Status New    Target Date 06/10/20             PT Long Term Goals - 05/13/20 1421      PT LONG TERM GOAL #1   Title Patient will increase 10 meter walk test to >1.22m/s as to improve gait speed for better community ambulation and to reduce fall risk.    Baseline 1.13 m/sec    Time 8    Period Weeks    Status New    Target Date 07/08/20      PT LONG TERM GOAL #2   Title Patient will increase Berg Balance score by > 6 points (47/56) to demonstrate decreased fall risk during functional activities.    Baseline 05/13/20= 40/56    Time 8    Period Weeks    Status New    Target Date 07/08/20      PT LONG TERM GOAL #3   Title Patient will increase ABC scale score >80% to demonstrate better functional mobility and better confidence with ADLs.    Baseline 05/13/20= 75    Time 8    Period Weeks    Status New    Target Date 07/08/20      PT LONG TERM GOAL #4   Title Patient will increase BLE gross strength to 4+/5 as to  improve functional strength for independent gait, increased standing tolerance and increased ADL ability.    Baseline BLE is 4/5 hip and knee flex/ext 5/5, ankle 4/5    Time 8    Period Weeks    Status New    Target Date 07/08/20                  Plan - 05/13/20 1421    Clinical Impression Statement Patient presents with decreased gait speed with rollator, decreased dynamic standing balance, and decreased BLE strength. Patient's main complaint is BLE weakness and inability to participate in desired activities. Patient wants to improve his balance and ability to ambulate on inclines and outdoor surfaces safely. Patient will benefit from skilled PT  in order to increase gait speed, increase BLE strength, and improve dynamic standing balance to decrease risk for falls and enable patient to participate in desired activities.   Personal Factors and Comorbidities Age;Comorbidity 3+;Education;Past/Current Experience;Social Background;Time since onset of injury/illness/exacerbation    Comorbidities PMH includes DM, hx of stroke, HLD, HTN, bradycardia, syncope and collapse, COPD, Diabetic retinopathy, nonrheumatic aortic valve stenosis, CVA, CAD, DDD, and chronic pain syndrome.    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 complexity    Rehab Potential Fair    PT Frequency 2x / week    PT Duration 8 weeks    PT Treatment/Interventions ADLs/Self Care Home Management;Aquatic Therapy;Biofeedback;Canalith Repostioning;Cryotherapy;Electrical Stimulation;Iontophoresis 4mg /ml Dexamethasone;Moist Heat;Traction;DME Instruction;Gait training;Stair training;Functional mobility training;Therapeutic  activities;Therapeutic exercise;Balance training;Neuromuscular re-education;Cognitive remediation;Manual techniques;Orthotic Fit/Training;Patient/family education;Passive range of motion;Visual/perceptual remediation/compensation;Vestibular;Taping;Splinting;Energy conservation    PT Next Visit Plan Continue balance exercises and incorporate head turns    PT Home Exercise Plan see above    Consulted and Agree with Plan of Care Patient           Patient will benefit from skilled therapeutic intervention in order to improve the following deficits and impairments:  Abnormal gait, Cardiopulmonary status limiting activity, Decreased activity tolerance, Decreased balance, Decreased knowledge of precautions, Decreased endurance, Decreased coordination, Decreased mobility, Decreased safety awareness, Difficulty walking, Decreased strength, Dizziness, Impaired flexibility, Impaired perceived functional ability, Impaired sensation, Impaired UE functional use, Impaired vision/preception, Postural dysfunction, Improper body mechanics  Visit Diagnosis: Other abnormalities of gait and mobility  Unsteadiness on feet  Muscle weakness (generalized)     Problem List Patient Active Problem List   Diagnosis Date Noted  . Bradycardia 03/19/2019  . Chest pain 12/20/2016  . HTN (hypertension) 12/20/2016  . HLD (hyperlipidemia) 12/20/2016  . Syncope and collapse 12/20/2016  . 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

## 2020-06-03 ENCOUNTER — Ambulatory Visit: Payer: Medicare Other | Admitting: Physical Therapy

## 2020-06-05 ENCOUNTER — Ambulatory Visit: Payer: Medicare Other | Admitting: Physical Therapy

## 2020-06-05 ENCOUNTER — Other Ambulatory Visit: Payer: Self-pay

## 2020-06-05 ENCOUNTER — Encounter: Payer: Self-pay | Admitting: Physical Therapy

## 2020-06-05 DIAGNOSIS — M6281 Muscle weakness (generalized): Secondary | ICD-10-CM

## 2020-06-05 DIAGNOSIS — R2689 Other abnormalities of gait and mobility: Secondary | ICD-10-CM

## 2020-06-05 DIAGNOSIS — R2681 Unsteadiness on feet: Secondary | ICD-10-CM

## 2020-06-05 NOTE — Therapy (Signed)
[place] MAIN [facility] SERVICES [address] [place], [place], [address] Phone: [phone]   Fax:  [phone]  Physical Therapy Treatment  Patient Details  Name: Mathew Baker MRN: [id number] Date of Birth: 12-26-1940 Referring Provider (PT): Jennings Books, MD   Encounter Date: 06/05/2020   PT End of Session - 06/05/20 1343    Visit Number 2    Number of Visits 17    Date for PT Re-Evaluation 07/08/20    PT Start Time 1300    PT Stop Time 1340    PT Time Calculation (min) 40 min    Equipment Utilized During Treatment Gait belt    Activity Tolerance Patient tolerated treatment well    Behavior During Therapy [facility] for tasks assessed/performed           Past Medical History:  Diagnosis Date  . Chronic airway obstruction ([place])   . Degeneration of intervertebral disc of lumbar region   . Diabetes mellitus without complication ([place])   . Essential hypertension, benign   . Hyperlipidemia   . Hypertension   . Osteoarthritis of lower extremity   . Pneumonia, organism unspecified([place])   . PSA elevation 07/16/2013   Normal  . Stroke [facility])     Past Surgical History:  Procedure Laterality Date  . CATARACT EXTRACTION W/ INTRAOCULAR LENS  IMPLANT, BILATERAL    . COLONOSCOPY  2006   Normal: Repeat in 10 yrs  . LOOP RECORDER INSERTION N/A 02/21/2020   Procedure: LOOP RECORDER INSERTION;  Surgeon: Isaias Cowman, MD;  Location: [facility];  Service: Cardiovascular;  Laterality: N/A;    There were no vitals filed for this visit.   Subjective Assessment - 06/05/20 1310    Subjective Patient fell out of his car haller on sunday and has multiple cuts and stiches in his forehead and right digits.. He reports that he is not hurting.    Currently in Pain? No/denies    Multiple Pain Sites No            Treatment: Treatment: Neuromuscular training: standing on blue foam with head turns x 1 min  Standing feet together on blue  foam with head turns x 1 min step ups from floor to 6 inch stool x 20 bilateral Step ups from blue foam to 6 inch stool x 20 BLE Marching, 2 1/2 lbs in parallel bars x 20 Staggered stand on on foam and stool with ball and trunk rotation x 20  Toe taps from foam to 6 inch stool x 20  Lateral Toe taps from foam to 6 inch stool x 20 each leg Fwd/ bwd steps over hurdle, lateral side steps over hurdle x 20    Pt educated throughout session about proper posture and technique with exercises. Improved exercise technique, movement at target joints, use of target muscles after min to mod verbal, visual, tactile cues. CGA and Min to mod verbal cues used throughout with increased in postural sway and LOB most seen with narrow base of support and while on uneven surfaces. Continues to have balance deficits typical with diagnosis.                        PT Education - 06/05/20 1312    Education Details HE    Person(s) Educated Patient    Methods Explanation    Comprehension Verbalized understanding            PT Short Term Goals - 05/13/20  [place] #1   Title Patient will be independent in home exercise program to improve strength/mobility for better functional independence with ADLs.    Baseline given beginning balance exercises    Time 4    Period Weeks    Status New    Target Date 06/10/20      PT SHORT TERM GOAL #2   Title Patient will improve FOTO score to indicate improved functional mobility    Time 4    Period Weeks    Status New    Target Date 06/10/20             PT Long Term Goals - 05/13/20 1421      PT LONG TERM GOAL #1   Title Patient will increase 10 meter walk test to >1.86m/s as to improve gait speed for better community ambulation and to reduce fall risk.    Baseline 1.13 m/sec    Time 8    Period Weeks    Status New    Target Date 07/08/20      PT LONG TERM GOAL #2   Title Patient will increase Berg Balance score by >  6 points (47/56) to demonstrate decreased fall risk during functional activities.    Baseline 05/13/20= 40/56    Time 8    Period Weeks    Status New    Target Date 07/08/20      PT LONG TERM GOAL #3   Title Patient will increase ABC scale score >80% to demonstrate better functional mobility and better confidence with ADLs.    Baseline 05/13/20= 75    Time 8    Period Weeks    Status New    Target Date 07/08/20      PT LONG TERM GOAL #4   Title Patient will increase BLE gross strength to 4+/5 as to improve functional strength for independent gait, increased standing tolerance and increased ADL ability.    Baseline BLE is 4/5 hip and knee flex/ext 5/5, ankle 4/5    Time 8    Period Weeks    Status New    Target Date 07/08/20                 Plan - 06/05/20 1314    Clinical Impression Statement Patient instructed in intermediate strengthening and balance exercise.  Patient requires min Vcs for correct exercise technique including to improve LE control with standing exercise. Patient demonstrates poor control with SLS tasks with rail assist. Patient would benefit from additional skilled PT intervention to improve balance/gait safety and reduce fall risk.   Personal Factors and Comorbidities Age;Comorbidity 3+;Education;Past/Current Experience;Social Background;Time since onset of injury/illness/exacerbation    Comorbidities PMH includes DM, hx of stroke, HLD, HTN, bradycardia, syncope and collapse, COPD, Diabetic retinopathy, nonrheumatic aortic valve stenosis, CVA, CAD, DDD, and chronic pain syndrome.    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 complexity    Rehab Potential Fair    PT  Frequency 2x / week    PT Duration 8 weeks    PT Treatment/Interventions ADLs/Self Care Home Management;Aquatic Therapy;Biofeedback;Canalith Repostioning;Cryotherapy;Electrical Stimulation;Iontophoresis 4mg /ml Dexamethasone;Moist Heat;Traction;DME Instruction;Gait training;Stair training;Functional mobility training;Therapeutic activities;Therapeutic exercise;Balance training;Neuromuscular re-education;Cognitive remediation;Manual techniques;Orthotic Fit/Training;Patient/family education;Passive range of motion;Visual/perceptual remediation/compensation;Vestibular;Taping;Splinting;Energy conservation    PT Next Visit Plan Continue balance exercises and incorporate head turns    PT Home Exercise Plan see  above    Consulted and Agree with Plan of Care Patient           Patient will benefit from skilled therapeutic intervention in order to improve the following deficits and impairments:  Abnormal gait, Cardiopulmonary status limiting activity, Decreased activity tolerance, Decreased balance, Decreased knowledge of precautions, Decreased endurance, Decreased coordination, Decreased mobility, Decreased safety awareness, Difficulty walking, Decreased strength, Dizziness, Impaired flexibility, Impaired perceived functional ability, Impaired sensation, Impaired UE functional use, Impaired vision/preception, Postural dysfunction, Improper body mechanics  Visit Diagnosis: Other abnormalities of gait and mobility  Unsteadiness on feet  Muscle weakness (generalized)     Problem List Patient Active Problem List   Diagnosis Date Noted  . Bradycardia 03/19/2019  . Chest pain 12/20/2016  . HTN (hypertension) 12/20/2016  . HLD (hyperlipidemia) 12/20/2016  . Syncope and collapse 12/20/2016  . Uncontrolled type 2 diabetes mellitus with microalbuminuria ([place]) 08/21/2014    Alanson Puls, [place] DPT 06/05/2020, 1:43 PM  [place] MAIN [facility] SERVICES [place], [place], [address] Phone: [phone]   Fax:  [phone]  Name: Kason Benak MRN: [id number] Date of Birth: 08-Aug-1941

## 2020-06-10 ENCOUNTER — Encounter: Payer: Self-pay | Admitting: Physical Therapy

## 2020-06-10 ENCOUNTER — Other Ambulatory Visit: Payer: Self-pay

## 2020-06-10 ENCOUNTER — Ambulatory Visit: Payer: Medicare Other | Attending: Internal Medicine | Admitting: Physical Therapy

## 2020-06-10 DIAGNOSIS — M6281 Muscle weakness (generalized): Secondary | ICD-10-CM | POA: Diagnosis present

## 2020-06-10 DIAGNOSIS — R2689 Other abnormalities of gait and mobility: Secondary | ICD-10-CM | POA: Diagnosis not present

## 2020-06-10 DIAGNOSIS — R2681 Unsteadiness on feet: Secondary | ICD-10-CM | POA: Insufficient documentation

## 2020-06-10 NOTE — Therapy (Signed)
[place] MAIN [facility] SERVICES [address] [place], [place], [address] Phone: [phone]   Fax:  [phone]  Physical Therapy Treatment  Patient Details  Name: Mathew Baker MRN: [id number] Date of Birth: 1941-04-01 Referring Provider (PT): Jennings Books, MD   Encounter Date: 06/10/2020   PT End of Session - 06/10/20 1309    Visit Number 3    Number of Visits 17    Date for PT Re-Evaluation 07/08/20    PT Start Time 1300    PT Stop Time 1340    PT Time Calculation (min) 40 min    Equipment Utilized During Treatment Gait belt    Activity Tolerance Patient tolerated treatment well    Behavior During Therapy [facility] for tasks assessed/performed           Past Medical History:  Diagnosis Date  . Chronic airway obstruction ([place])   . Degeneration of intervertebral disc of lumbar region   . Diabetes mellitus without complication ([place])   . Essential hypertension, benign   . Hyperlipidemia   . Hypertension   . Osteoarthritis of lower extremity   . Pneumonia, organism unspecified(486)   . PSA elevation 07/16/2013   Normal  . Stroke [facility])     Past Surgical History:  Procedure Laterality Date  . CATARACT EXTRACTION W/ INTRAOCULAR LENS  IMPLANT, BILATERAL    . COLONOSCOPY  2006   Normal: Repeat in 10 yrs  . LOOP RECORDER INSERTION N/A 02/21/2020   Procedure: LOOP RECORDER INSERTION;  Surgeon: Isaias Cowman, MD;  Location: [place] [facility];  Service: Cardiovascular;  Laterality: N/A;    There were no vitals filed for this visit.   Subjective Assessment - 06/10/20 1307    Subjective Patient is having some L hip pain.    Pertinent History Patient is using a rollator at home and for walking outdoors. He has no falls but feels unsteady. He had a TIA in april/May and wants to improve his balance.    Limitations Lifting;Standing;Walking;House hold activities;Other (comment)    How long can you sit comfortably? n/a    How long can  you stand comfortably? 2 hours    How long can you walk comfortably? needs to use stick, tries to not use walking stick.    Diagnostic tests Imaging: MRI: acute infarction affecting cerebellar vermis and medial aspect of both inferior cerebellar hemispheres. 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    Patient Stated Goals to get to walking right.    Currently in Pain? Yes    Pain Score 3     Pain Location Hip    Pain Orientation Left    Pain Descriptors / Indicators Aching    Pain Type Chronic pain    Pain Radiating Towards na    Pain Onset More than a month ago    Aggravating Factors  activity    Pain Relieving Factors sitting    Effect of Pain on Daily Activities slower             Ther-ex  Octane fitness x 5 mins , L4 Side stepping over hurdle with 4 # x 15 Hip flexion marches with 4# ankle weights x 10 bilateral Hip abduction with 4# x 10 bilateral HS curls with 4# AW x 10 bilateral Hip extension with 4# x 10 bilateral Sit to stand without UE  support from regular height chair x 10   Standing 3-way hip: flex, abd, ext x 15 x 2 sets Lunges to BOSU ball x 15 BLE High marching x 20 BLE, 4 lbs  Step ups to 6-inch stool x 20 , 4 lbs Eccentric step downs from 3 inch stool x 10 verbal cues to complete slow and tap heel.  BUE CGA Patient needs occasional verbal cueing to improve posture and cueing to correctly perform exercises slowly, holding at end of range to increase motor firing of desired muscle to encourage fatigue.                          PT Education - 06/10/20 1309    Education Details HEP    Person(s) Educated Patient    Methods Explanation    Comprehension Verbalized understanding;Need further instruction            PT Short Term Goals - 05/13/20 1617       PT SHORT TERM GOAL #1   Title Patient will be independent in home exercise program to improve strength/mobility for better functional independence with ADLs.    Baseline given beginning balance exercises    Time 4    Period Weeks    Status New    Target Date 06/10/20      PT SHORT TERM GOAL #2   Title Patient will improve FOTO score to indicate improved functional mobility    Time 4    Period Weeks    Status New    Target Date 06/10/20             PT Long Term Goals - 05/13/20 1421      PT LONG TERM GOAL #1   Title Patient will increase 10 meter walk test to >1.39m/s as to improve gait speed for better community ambulation and to reduce fall risk.    Baseline 1.13 m/sec    Time 8    Period Weeks    Status New    Target Date 07/08/20      PT LONG TERM GOAL #2   Title Patient will increase Berg Balance score by > 6 points (47/56) to demonstrate decreased fall risk during functional activities.    Baseline 05/13/20= 40/56    Time 8    Period Weeks    Status New    Target Date 07/08/20      PT LONG TERM GOAL #3   Title Patient will increase ABC scale score >80% to demonstrate better functional mobility and better confidence with ADLs.    Baseline 05/13/20= 75    Time 8    Period Weeks    Status New    Target Date 07/08/20      PT LONG TERM GOAL #4   Title Patient will increase BLE gross strength to 4+/5 as to improve functional strength for independent gait, increased standing tolerance and increased ADL ability.    Baseline BLE is 4/5 hip and knee flex/ext 5/5, ankle 4/5    Time 8    Period Weeks    Status New    Target Date 07/08/20                 Plan - 06/10/20 1310    Clinical Impression Statement  Pt was able to perform  exercises today with CGA.Marland Kitchen Pt was able to perform all balance and strength exercises, demonstrating improvements in LE strength and stability.  Pt was able to  complete dynamic balance exercises, showing ability to stand on even  surfaces with min assist and improve postural reactions to correct self during activities.  Pt requires verbal, visual and tactile cues during exercise in order to complete tasks with proper form and technique.  Pt would continue to benefit from skilled PT services in order to further strengthen LE's, improve static and dynamic balance, and improve coordination in order to increase functional mobility and decrease risk of falls   Personal Factors and Comorbidities Age;Comorbidity 3+;Education;Past/Current Experience;Social Background;Time since onset of injury/illness/exacerbation    Comorbidities PMH includes DM, hx of stroke, HLD, HTN, bradycardia, syncope and collapse, COPD, Diabetic retinopathy, nonrheumatic aortic valve stenosis, CVA, CAD, DDD, and chronic pain syndrome.    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 complexity    Rehab Potential Fair    PT Frequency 2x / week    PT Duration 8 weeks    PT Treatment/Interventions ADLs/Self Care Home Management;Aquatic Therapy;Biofeedback;Canalith Repostioning;Cryotherapy;Electrical Stimulation;Iontophoresis 4mg /ml Dexamethasone;Moist Heat;Traction;DME Instruction;Gait training;Stair training;Functional mobility training;Therapeutic activities;Therapeutic exercise;Balance training;Neuromuscular re-education;Cognitive remediation;Manual techniques;Orthotic Fit/Training;Patient/family education;Passive range of motion;Visual/perceptual remediation/compensation;Vestibular;Taping;Splinting;Energy conservation    PT Next Visit Plan Continue balance exercises and incorporate head turns    PT Home Exercise Plan see above    Consulted and Agree with Plan of Care  Patient           Patient will benefit from skilled therapeutic intervention in order to improve the following deficits and impairments:  Abnormal gait, Cardiopulmonary status limiting activity, Decreased activity tolerance, Decreased balance, Decreased knowledge of precautions, Decreased endurance, Decreased coordination, Decreased mobility, Decreased safety awareness, Difficulty walking, Decreased strength, Dizziness, Impaired flexibility, Impaired perceived functional ability, Impaired sensation, Impaired UE functional use, Impaired vision/preception, Postural dysfunction, Improper body mechanics  Visit Diagnosis: Other abnormalities of gait and mobility  Unsteadiness on feet  Muscle weakness (generalized)     Problem List Patient Active Problem List   Diagnosis Date Noted  . Bradycardia 03/19/2019  . Chest pain 12/20/2016  . HTN (hypertension) 12/20/2016  . HLD (hyperlipidemia) 12/20/2016  . Syncope and collapse 12/20/2016  . 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

## 2020-06-12 ENCOUNTER — Encounter: Payer: Self-pay | Admitting: Physical Therapy

## 2020-06-12 ENCOUNTER — Other Ambulatory Visit: Payer: Self-pay

## 2020-06-12 ENCOUNTER — Ambulatory Visit: Payer: Medicare Other | Admitting: Physical Therapy

## 2020-06-12 VITALS — BP 143/58

## 2020-06-12 DIAGNOSIS — R2689 Other abnormalities of gait and mobility: Secondary | ICD-10-CM

## 2020-06-12 DIAGNOSIS — R2681 Unsteadiness on feet: Secondary | ICD-10-CM

## 2020-06-12 DIAGNOSIS — M6281 Muscle weakness (generalized): Secondary | ICD-10-CM

## 2020-06-12 NOTE — Therapy (Signed)
[place] MAIN [facility] SERVICES [address] [place], [place], [address] Phone: [phone]   Fax:  [phone]  Physical Therapy Treatment  Patient Details  Name: Mathew Baker MRN: [id number] Date of Birth: 04/04/1941 Referring Provider (PT): Jennings Books, MD   Encounter Date: 06/12/2020   PT End of Session - 06/12/20 1309    Visit Number 4    Number of Visits 17    Date for PT Re-Evaluation 07/08/20    PT Start Time 1300    PT Stop Time 1340    PT Time Calculation (min) 40 min           Past Medical History:  Diagnosis Date  . Chronic airway obstruction ([place])   . Degeneration of intervertebral disc of lumbar region   . Diabetes mellitus without complication ([place])   . Essential hypertension, benign   . Hyperlipidemia   . Hypertension   . Osteoarthritis of lower extremity   . Pneumonia, organism unspecified([place])   . PSA elevation 07/16/2013   Normal  . Stroke [facility])     Past Surgical History:  Procedure Laterality Date  . CATARACT EXTRACTION W/ INTRAOCULAR LENS  IMPLANT, BILATERAL    . COLONOSCOPY  2006   Normal: Repeat in 10 yrs  . LOOP RECORDER INSERTION N/A 02/21/2020   Procedure: LOOP RECORDER INSERTION;  Surgeon: Isaias Cowman, MD;  Location: [place] [facility];  Service: Cardiovascular;  Laterality: N/A;    There were no vitals filed for this visit.   Subjective Assessment - 06/12/20 1308    Subjective Patient is having some L hip pain.    Currently in Pain? No/denies    Pain Score 0-No pain             Ther-ex  Octane fitness x 5 mins  Hip flexion marches with 2# ankle weights x 10 bilateral Hip abduction with 2# x 10 bilateral Hip extension with 2# x 10 bilateral Sit to stand without UE support from regular height chair x 10   Lunges to BOSU ball x 15 BLE Heel raises x 15 x 2 sets High marching x 20 BLE Step ups to 6-inch stool x 20  Eccentric step downs from 3 inch stool x 10 verbal cues to  complete slow and tap heel.  BUE CGA Patient needs occasional verbal cueing to improve posture and cueing to correctly perform exercises slowly, holding at end of range to increase motor firing of desired muscle to encourage fatigue.  Patient was feeling somewhat dizzy and BP taken  And a cup of water before he left from his appointment.                        PT Education - 06/12/20 1308    Education Details HEP    Person(s) Educated Patient    Methods Explanation    Comprehension Need further instruction;Verbal cues required            PT Short Term Goals - 05/13/20 1617      PT SHORT TERM GOAL #1   Title Patient will be independent in home exercise program to improve strength/mobility for better functional independence with ADLs.    Baseline given beginning balance exercises    Time 4    Period Weeks    Status New    Target Date 06/10/20      PT SHORT TERM GOAL #2   Title Patient will improve FOTO score to indicate  improved functional mobility    Time 4    Period Weeks    Status New    Target Date 06/10/20             PT Long Term Goals - 05/13/20 1421      PT LONG TERM GOAL #1   Title Patient will increase 10 meter walk test to >1.1m/s as to improve gait speed for better community ambulation and to reduce fall risk.    Baseline 1.13 m/sec    Time 8    Period Weeks    Status New    Target Date 07/08/20      PT LONG TERM GOAL #2   Title Patient will increase Berg Balance score by > 6 points (47/56) to demonstrate decreased fall risk during functional activities.    Baseline 05/13/20= 40/56    Time 8    Period Weeks    Status New    Target Date 07/08/20      PT LONG TERM GOAL #3   Title Patient will increase ABC scale score >80% to demonstrate better functional mobility and better confidence with ADLs.    Baseline 05/13/20= 75    Time 8    Period Weeks    Status New    Target Date 07/08/20      PT LONG TERM GOAL #4   Title Patient will  increase BLE gross strength to 4+/5 as to improve functional strength for independent gait, increased standing tolerance and increased ADL ability.    Baseline BLE is 4/5 hip and knee flex/ext 5/5, ankle 4/5    Time 8    Period Weeks    Status New    Target Date 07/08/20                 Plan - 06/12/20 1309    Clinical Impression Statement Patient instructed in beginning balance and coordination exercise. Patient required mod VCs and min A for postural control. Patient requires min VCs to improve posture and motor control.  Patients would benefit from additional skilled PT intervention to improve balance/gait safety and reduce fall risk.   Personal Factors and Comorbidities Age;Comorbidity 3+;Education;Past/Current Experience;Social Background;Time since onset of injury/illness/exacerbation    Comorbidities PMH includes DM, hx of stroke, HLD, HTN, bradycardia, syncope and collapse, COPD, Diabetic retinopathy, nonrheumatic aortic valve stenosis, CVA, CAD, DDD, and chronic pain syndrome.    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 complexity    Rehab Potential Fair    PT Frequency 2x / week    PT Duration 8 weeks    PT Treatment/Interventions ADLs/Self Care Home Management;Aquatic Therapy;Biofeedback;Canalith Repostioning;Cryotherapy;Electrical Stimulation;Iontophoresis 4mg /ml Dexamethasone;Moist Heat;Traction;DME Instruction;Gait training;Stair training;Functional mobility training;Therapeutic activities;Therapeutic exercise;Balance training;Neuromuscular re-education;Cognitive remediation;Manual techniques;Orthotic Fit/Training;Patient/family education;Passive range of motion;Visual/perceptual  remediation/compensation;Vestibular;Taping;Splinting;Energy conservation    PT Next Visit Plan Continue balance exercises and incorporate head turns    PT Home Exercise Plan see above    Consulted and Agree with Plan of Care Patient           Patient will benefit from skilled therapeutic intervention in order to improve the following deficits and impairments:  Abnormal gait, Cardiopulmonary status limiting activity, Decreased activity tolerance, Decreased balance, Decreased knowledge of precautions, Decreased endurance, Decreased coordination, Decreased mobility, Decreased safety awareness, Difficulty walking, Decreased strength, Dizziness, Impaired flexibility, Impaired perceived functional ability, Impaired sensation, Impaired UE functional use, Impaired vision/preception, Postural dysfunction, Improper body  mechanics  Visit Diagnosis: Other abnormalities of gait and mobility  Unsteadiness on feet  Muscle weakness (generalized)     Problem List Patient Active Problem List   Diagnosis Date Noted  . Bradycardia 03/19/2019  . Chest pain 12/20/2016  . HTN (hypertension) 12/20/2016  . HLD (hyperlipidemia) 12/20/2016  . Syncope and collapse 12/20/2016  . Uncontrolled type 2 diabetes mellitus with microalbuminuria ([place]) 08/21/2014    Alanson Puls, PT DPT 06/12/2020, 1:17 PM  [place] MAIN [facility] SERVICES [address] [place], [place], [address] Phone: [phone]   Fax:  [phone]  Name: Taylin Leder MRN: [id number] Date of Birth: 12-07-1940

## 2020-06-12 NOTE — Patient Instructions (Signed)
Lunge (45 Forward)     Lunge diagonally forward on right leg. Repeat 10__ times. Repeat with other leg for set.. Do 2__ sets per session.  Copyright  VHI. All rights reserved.  Heel Raises    Stand with support. . With knees straight, raise heels off ground. Hold __2_ seconds2Repeat __15_ times. Do _2__ times a day.  Copyright  VHI. All rights reserved.  Marching In-Place    Standing straight, alternate bringing knees toward trunk. Bent arms swing alternately. Do  20 reps. Do _2__ times per day.  Copyright  VHI. All rights reserved.  Hip Extension (Standing)    Stand with support. . Move right leg backward with straight knee. Repeat __15_ times. Do _2__ times a day. Repeat with other leg. Add  weight.   Copyright  VHI. All rights reserved.  HIP: Abduction - Standing (Band)    Place band around legs.  Raise leg out and slightly back.weight on ankle __10_ reps per set, _2__ sets per Hold onto a support.  Copyright  VHI. All rights reserved.

## 2020-06-17 ENCOUNTER — Ambulatory Visit: Payer: Medicare Other | Admitting: Physical Therapy

## 2020-06-19 ENCOUNTER — Ambulatory Visit: Payer: Medicare Other | Admitting: Physical Therapy

## 2020-06-24 ENCOUNTER — Ambulatory Visit: Payer: Medicare Other | Admitting: Physical Therapy

## 2020-06-26 ENCOUNTER — Encounter: Payer: Self-pay | Admitting: Physical Therapy

## 2020-06-26 ENCOUNTER — Ambulatory Visit: Payer: Medicare Other | Admitting: Physical Therapy

## 2020-06-26 ENCOUNTER — Other Ambulatory Visit: Payer: Self-pay

## 2020-06-26 DIAGNOSIS — M6281 Muscle weakness (generalized): Secondary | ICD-10-CM

## 2020-06-26 DIAGNOSIS — R2689 Other abnormalities of gait and mobility: Secondary | ICD-10-CM | POA: Diagnosis not present

## 2020-06-26 DIAGNOSIS — R2681 Unsteadiness on feet: Secondary | ICD-10-CM

## 2020-06-26 NOTE — Therapy (Signed)
[place] MAIN [facility] SERVICES [address] [place], [place], [address] Phone: [phone]   Fax:  [phone]  Physical Therapy Treatment  Patient Details  Name: Mathew Baker MRN: [id number] Date of Birth: 1941/01/05 Referring Provider (PT): Jennings Books, MD   Encounter Date: 06/26/2020   PT End of Session - 06/26/20 1308    Visit Number 5    Number of Visits 17    Date for PT Re-Evaluation 07/08/20    PT Start Time 1304    PT Stop Time 1345    PT Time Calculation (min) 41 min    Equipment Utilized During Treatment Gait belt    Activity Tolerance Patient tolerated treatment well    Behavior During Therapy [facility] for tasks assessed/performed           Past Medical History:  Diagnosis Date  . Chronic airway obstruction ([place])   . Degeneration of intervertebral disc of lumbar region   . Diabetes mellitus without complication ([place])   . Essential hypertension, benign   . Hyperlipidemia   . Hypertension   . Osteoarthritis of lower extremity   . Pneumonia, organism unspecified(486)   . PSA elevation 07/16/2013   Normal  . Stroke [facility])     Past Surgical History:  Procedure Laterality Date  . CATARACT EXTRACTION W/ INTRAOCULAR LENS  IMPLANT, BILATERAL    . COLONOSCOPY  2006   Normal: Repeat in 10 yrs  . LOOP RECORDER INSERTION N/A 02/21/2020   Procedure: LOOP RECORDER INSERTION;  Surgeon: Isaias Cowman, MD;  Location: [facility];  Service: Cardiovascular;  Laterality: N/A;    There were no vitals filed for this visit.   Subjective Assessment - 06/26/20 1307    Subjective Patient is doing well today, no pain    Pertinent History Patient is using a rollator at home and for walking outdoors. He has no falls but feels unsteady. He had a TIA in april/May and wants to improve his balance.    Limitations Lifting;Standing;Walking;House hold activities;Other (comment)    How long can you sit comfortably? n/a    How long  can you stand comfortably? 2 hours    How long can you walk comfortably? needs to use stick, tries to not use walking stick.    Diagnostic tests Imaging: MRI: acute infarction affecting cerebellar vermis and medial aspect of both inferior cerebellar hemispheres. 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    Patient Stated Goals to get to walking right.    Currently in Pain? No/denies    Pain Score 0-No pain    Pain Onset More than a month ago            Treatment: Neuromuscular training: Tandem stand on foam/ 6 inch stool w/ trunk rotation x 20 side stepping left and right in parallel bars 10 feet x 3 standing on blue foam with head turns x 1 min  Standing feet together on blue foam with head turns x 1 min step ups from floor to 6 inch stool x 20 bilateral Step ups from blue foam to 6 inch stool x 20 BLE Lunge from BOSU ball x 15 BLE Side stepping on balance foam 10 feet x 5   Pt educated throughout session about proper posture and technique with exercises. Improved exercise technique, movement at target joints, use of target muscles after  min to mod verbal, visual, tactile cues. CGA and Min to mod verbal cues used throughout with increased in postural sway and LOB most seen with narrow base of support and while on uneven surfaces.                       PT Education - 06/26/20 1308    Education Details HEP    Person(s) Educated Patient    Methods Explanation    Comprehension Verbalized understanding            PT Short Term Goals - 05/13/20 1617      PT SHORT TERM GOAL #1   Title Patient will be independent in home exercise program to improve strength/mobility for better functional independence with ADLs.    Baseline given beginning balance exercises    Time 4     Period Weeks    Status New    Target Date 06/10/20      PT SHORT TERM GOAL #2   Title Patient will improve FOTO score to indicate improved functional mobility    Time 4    Period Weeks    Status New    Target Date 06/10/20             PT Long Term Goals - 05/13/20 1421      PT LONG TERM GOAL #1   Title Patient will increase 10 meter walk test to >1.39m/s as to improve gait speed for better community ambulation and to reduce fall risk.    Baseline 1.13 m/sec    Time 8    Period Weeks    Status New    Target Date 07/08/20      PT LONG TERM GOAL #2   Title Patient will increase Berg Balance score by > 6 points (47/56) to demonstrate decreased fall risk during functional activities.    Baseline 05/13/20= 40/56    Time 8    Period Weeks    Status New    Target Date 07/08/20      PT LONG TERM GOAL #3   Title Patient will increase ABC scale score >80% to demonstrate better functional mobility and better confidence with ADLs.    Baseline 05/13/20= 75    Time 8    Period Weeks    Status New    Target Date 07/08/20      PT LONG TERM GOAL #4   Title Patient will increase BLE gross strength to 4+/5 as to improve functional strength for independent gait, increased standing tolerance and increased ADL ability.    Baseline BLE is 4/5 hip and knee flex/ext 5/5, ankle 4/5    Time 8    Period Weeks    Status New    Target Date 07/08/20                 Plan - 06/26/20 1309    Clinical Impression Statement Patient instructed in intermediate strengthening and balance exercise.  Patient requires min Vcs for correct exercise technique including to improve LE control with standing exercise. Patient demonstrates better motor control with balance tasks with rail assist. Patient would benefit from additional skilled PT intervention to improve balance/gait safety and reduce fall risk.    Personal Factors and Comorbidities Age;Comorbidity 3+;Education;Past/Current Experience;Social  Background;Time since onset of injury/illness/exacerbation    Comorbidities PMH includes DM, hx of stroke, HLD, HTN, bradycardia, syncope and collapse, COPD, Diabetic retinopathy, nonrheumatic aortic valve stenosis, CVA, CAD, DDD, and chronic pain syndrome.  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 complexity    Rehab Potential Fair    PT Frequency 2x / week    PT Duration 8 weeks    PT Treatment/Interventions ADLs/Self Care Home Management;Aquatic Therapy;Biofeedback;Canalith Repostioning;Cryotherapy;Electrical Stimulation;Iontophoresis 4mg /ml Dexamethasone;Moist Heat;Traction;DME Instruction;Gait training;Stair training;Functional mobility training;Therapeutic activities;Therapeutic exercise;Balance training;Neuromuscular re-education;Cognitive remediation;Manual techniques;Orthotic Fit/Training;Patient/family education;Passive range of motion;Visual/perceptual remediation/compensation;Vestibular;Taping;Splinting;Energy conservation    PT Next Visit Plan Continue balance exercises and incorporate head turns    PT Home Exercise Plan see above    Consulted and Agree with Plan of Care Patient           Patient will benefit from skilled therapeutic intervention in order to improve the following deficits and impairments:  Abnormal gait, Cardiopulmonary status limiting activity, Decreased activity tolerance, Decreased balance, Decreased knowledge of precautions, Decreased endurance, Decreased coordination, Decreased mobility, Decreased safety awareness, Difficulty walking, Decreased strength, Dizziness, Impaired flexibility, Impaired perceived functional ability, Impaired sensation, Impaired UE functional  use, Impaired vision/preception, Postural dysfunction, Improper body mechanics  Visit Diagnosis: Other abnormalities of gait and mobility  Unsteadiness on feet  Muscle weakness (generalized)     Problem List Patient Active Problem List   Diagnosis Date Noted  . Bradycardia 03/19/2019  . Chest pain 12/20/2016  . HTN (hypertension) 12/20/2016  . HLD (hyperlipidemia) 12/20/2016  . Syncope and collapse 12/20/2016  . 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 [place], [place], [address] Phone: [phone]   Fax:  [phone]  Name: Edrian Melucci MRN: [id number] Date of Birth: 05/29/1941

## 2020-07-01 ENCOUNTER — Ambulatory Visit: Payer: Medicare Other | Admitting: Physical Therapy

## 2020-07-03 ENCOUNTER — Ambulatory Visit: Payer: Medicare Other | Admitting: Physical Therapy

## 2020-07-07 ENCOUNTER — Ambulatory Visit: Payer: Medicare Other

## 2020-07-09 ENCOUNTER — Other Ambulatory Visit: Payer: Self-pay

## 2020-07-09 ENCOUNTER — Ambulatory Visit: Payer: Medicare Other | Attending: Internal Medicine

## 2020-07-09 VITALS — BP 144/66 | HR 70

## 2020-07-09 DIAGNOSIS — R2689 Other abnormalities of gait and mobility: Secondary | ICD-10-CM | POA: Diagnosis present

## 2020-07-09 DIAGNOSIS — R2681 Unsteadiness on feet: Secondary | ICD-10-CM | POA: Insufficient documentation

## 2020-07-09 NOTE — Therapy (Signed)
[place] MAIN [facility] SERVICES [address] [place], [place], [address] Phone: [phone]   Fax:  [phone]  Physical Therapy Treatment/Recertification  Patient Details  Name: Mathew Baker MRN: [id number] Date of Birth: [birth year] Referring Provider (PT): Jennings Books, MD   Encounter Date: 07/09/2020   PT End of Session - 07/09/20 1349    Visit Number 6    Number of Visits 33    Date for PT Re-Evaluation 09/03/20    PT Start Time 5462    PT Stop Time 1430    PT Time Calculation (min) 45 min    Equipment Utilized During Treatment Gait belt    Activity Tolerance Patient tolerated treatment well    Behavior During Therapy [facility] for tasks assessed/performed           Past Medical History:  Diagnosis Date  . Chronic airway obstruction ([place])   . Degeneration of intervertebral disc of lumbar region   . Diabetes mellitus without complication ([place])   . Essential hypertension, benign   . Hyperlipidemia   . Hypertension   . Osteoarthritis of lower extremity   . Pneumonia, organism unspecified(486)   . PSA elevation 07/16/2013   Normal  . Stroke [facility])     Past Surgical History:  Procedure Laterality Date  . CATARACT EXTRACTION W/ INTRAOCULAR LENS  IMPLANT, BILATERAL    . COLONOSCOPY  2006   Normal: Repeat in 10 yrs  . LOOP RECORDER INSERTION N/A 02/21/2020   Procedure: LOOP RECORDER INSERTION;  Surgeon: Isaias Cowman, MD;  Location: [facility];  Service: Cardiovascular;  Laterality: N/A;    Vitals:   07/09/20 1427  BP: (!) 144/66  Pulse: 70  SpO2: 100%     Subjective Assessment - 07/09/20 1348    Subjective Patient is doing well today. No pain and no falls since last therapy session. He has some episodes of vertigo in the morning when he first wakes up.    Pertinent History Patient is using a rollator at home and for walking outdoors. He has no falls but feels unsteady. He had a TIA in april/May and wants to  improve his balance.    Limitations Lifting;Standing;Walking;House hold activities;Other (comment)    How long can you sit comfortably? n/a    How long can you stand comfortably? 2 hours    How long can you walk comfortably? needs to use stick, tries to not use walking stick.    Diagnostic tests Imaging: MRI: acute infarction affecting cerebellar vermis and medial aspect of both inferior cerebellar hemispheres. 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    Patient Stated Goals to get to walking right.    Currently in Pain? No/denies              VESTIBULAR SCREEN   HISTORY:  Subjective history of current problem: Pt complains of some vertigo and dizziness when he first wakes up in the morning. He has difficulty recalling time frame of how long it has been persistent. Otherwise pt is a poor historian providing additional details regarding his symptoms. Description of dizziness: (vertigo, unsteadiness, lightheadedness, falling, general unsteadiness, whoozy, swimmy-headed sensation, aural fullness) vertigo and unsteadiness Frequency: Daily Duration: Pt is unsure, reports that it improves once he gets up and moves around Symptom nature: (motion provoked, positional, spontaneous, constant, variable, intermittent) spontaneous first thing in the  morning however otherwise pt is unsure  Provocative Factors: Unknown Easing Factors: Unknown  Progression of symptoms: (better, worse, no change since onset) Unchanged History of similar episodes: yes after previous stroke  Falls (yes/no): One fall in the last 6 months  Prior Functional Level: ambulates with rollator, modI for transfers and ambulation  Auditory complaints (tinnitus, pain, drainage, hearing loss, aural fullness): None Vision (diplopia, visual  field loss, recent changes, last eye exam): Wears glasses but otherwise no changes reported  Red Flags: (dysarthria, dysphagia, drop attacks, bowel and bladder changes, recent weight loss/gain) Review of systems negative for red flags.    OCULOMOTOR / VESTIBULAR TESTING:  Oculomotor Exam- Room Light  Findings Comments  Ocular Alignment normal   Ocular ROM normal   Spontaneous Nystagmus normal   Gaze-Holding Nystagmus normal   End-Gaze Nystagmus normal   Vergence (normal 2-3") not examined   Smooth Pursuit abnormal Saccadic  Cross-Cover Test not examined   Saccades normal   VOR Cancellation normal   Left Head Impulse Pt unable to relax   Right Head Impulse Pt unable to relax   Static Acuity not examined   Dynamic Acuity not examined     Oculomotor Exam- Fixation Suppressed: Deferred   BPPV TESTS:  Symptoms Duration Intensity Nystagmus  L Dix-Hallpike None   None  R Dix-Hallpike None   None  L Head Roll None   None  R Head Roll None   None  L Sidelying Test      R Sidelying Test        Alternating 6 inch step taps x10 BLE; Alternating 6 inch step ups without UEs support x10 BLE; Airex alternating 6 inch step taps x10 BLE; Airex NBOS eyes open/eyes closed x30 seconds each; Airex NBOS eyes open horizontal and vertical head turns x30 seconds each;   Therapist performed vestibular screen at request of patient's primary physical therapist. Patient is a poor historian so is difficult to get an accurate history regarding aggravating easing symptoms as well as progression. No significant findings during history or evaluation that would suggest peripheral involvement in symptoms. Outcome measures and goals were deferred this session for vestibular screen however will need to be performed at next session to assess patient's progress. After vestibular screen he is able to progress with some limited standing balance exercise with therapist today. Of note patient does report that he is  having some worsening DOE over the last month in addition to new onset swelling in bilateral lower extremities. Therapist assessed and noted 2+ pitting edema BLE. Patient encouraged to contact his PCP and follow-up regarding these issues. Pt presents with deficits in strength, gait and balance. He will benefit from skilled PT services to address deficits in balance and decrease risk for future falls.                                      PT Short Term Goals - 07/10/20 1004      PT SHORT TERM GOAL #1   Title Patient will be independent in home exercise program to improve strength/mobility for better functional independence with ADLs.    Baseline given beginning balance exercises    Time 4    Period Weeks    Status Deferred    Target Date 08/07/20      PT SHORT TERM GOAL #2   Title Patient will improve FOTO score to indicate improved functional mobility  Time 4    Period Weeks    Status Deferred    Target Date 08/07/20             PT Long Term Goals - 07/10/20 1004      PT LONG TERM GOAL #1   Title Patient will increase 10 meter walk test to >1.76m/s as to improve gait speed for better community ambulation and to reduce fall risk.    Baseline 1.13 m/sec    Time 8    Period Weeks    Status Deferred    Target Date 09/03/20      PT LONG TERM GOAL #2   Title Patient will increase Berg Balance score by > 6 points (47/56) to demonstrate decreased fall risk during functional activities.    Baseline 05/13/20= 40/56    Time 8    Period Weeks    Status Deferred    Target Date 09/03/20      PT LONG TERM GOAL #3   Title Patient will increase ABC scale score >80% to demonstrate better functional mobility and better confidence with ADLs.    Baseline 05/13/20= 75    Time 8    Period Weeks    Status Deferred    Target Date 09/03/20      PT LONG TERM GOAL #4   Title Patient will increase BLE gross strength to 4+/5 as to improve functional strength for  independent gait, increased standing tolerance and increased ADL ability.    Baseline BLE is 4/5 hip and knee flex/ext 5/5, ankle 4/5    Time 8    Period Weeks    Status Deferred    Target Date 09/03/20                 Plan - 07/09/20 1349    Clinical Impression Statement Therapist performed vestibular screen at request of patient's primary physical therapist. Patient is a poor historian so is difficult to get an accurate history regarding aggravating easing symptoms as well as progression. No significant findings during history or evaluation that would suggest peripheral involvement in symptoms. Outcome measures and goals were deferred this session for vestibular screen however will need to be performed at next session to assess patient's progress. After vestibular screen he is able to progress with some limited standing balance exercise with therapist today. Of note patient does report that he is having some worsening DOE over the last month in addition to new onset swelling in bilateral lower extremities. Therapist assessed and noted 2+ pitting edema BLE. Patient encouraged to contact his PCP and follow-up regarding these issues. Pt presents with deficits in strength, gait and balance. He will benefit from skilled PT services to address deficits in balance and decrease risk for future falls.    Personal Factors and Comorbidities Age;Comorbidity 3+;Education;Past/Current Experience;Social Background;Time since onset of injury/illness/exacerbation    Comorbidities PMH includes DM, hx of stroke, HLD, HTN, bradycardia, syncope and collapse, COPD, Diabetic retinopathy, nonrheumatic aortic valve stenosis, CVA, CAD, DDD, and chronic pain syndrome.    Examination-Activity Limitations Bend;Caring for Others;Carry;Dressing;Lift;Locomotion Level;Reach Overhead;Squat;Stairs;Stand;Toileting;Transfers    Examination-Participation Restrictions Church;Cleaning;Community Activity;Personal Finances;Meal  Prep;Laundry;Shop;Volunteer;Yard Work    [employer] officer Evolving/Moderate complexity    Rehab Potential Fair    PT Frequency 2x / week    PT Duration 8 weeks    PT Treatment/Interventions ADLs/Self Care Home Management;Aquatic Therapy;Biofeedback;Canalith Repostioning;Cryotherapy;Electrical Stimulation;Iontophoresis 4mg /ml Dexamethasone;Moist Heat;Traction;DME Instruction;Gait training;Stair training;Functional mobility training;Therapeutic activities;Therapeutic exercise;Balance training;Neuromuscular re-education;Cognitive remediation;Manual techniques;Orthotic Fit/Training;Patient/family education;Passive range of motion;Visual/perceptual remediation/compensation;Vestibular;Taping;Splinting;Energy  conservation    PT Next Visit Plan Update outcome measures and goals, Continue balance exercises and incorporate head turns    PT Home Exercise Plan see above    Consulted and Agree with Plan of Care Patient           Patient will benefit from skilled therapeutic intervention in order to improve the following deficits and impairments:  Abnormal gait, Cardiopulmonary status limiting activity, Decreased activity tolerance, Decreased balance, Decreased knowledge of precautions, Decreased endurance, Decreased coordination, Decreased mobility, Decreased safety awareness, Difficulty walking, Decreased strength, Dizziness, Impaired flexibility, Impaired perceived functional ability, Impaired sensation, Impaired UE functional use, Impaired vision/preception, Postural dysfunction, Improper body mechanics  Visit Diagnosis: Other abnormalities of gait and mobility - Plan: PT plan of care cert/re-cert  Unsteadiness on feet - Plan: PT plan of care cert/re-cert     Problem List Patient Active Problem List   Diagnosis Date Noted  . Bradycardia 03/19/2019  . Chest pain 12/20/2016  . HTN (hypertension) 12/20/2016  . HLD (hyperlipidemia) 12/20/2016  . Syncope and collapse 12/20/2016  .  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

## 2020-07-15 ENCOUNTER — Ambulatory Visit: Payer: Medicare Other | Admitting: Physical Therapy

## 2020-07-17 ENCOUNTER — Ambulatory Visit: Payer: Medicare Other | Admitting: Physical Therapy

## 2020-07-21 ENCOUNTER — Ambulatory Visit: Payer: Medicare Other | Admitting: Physical Therapy

## 2020-07-23 ENCOUNTER — Ambulatory Visit: Payer: Medicare Other

## 2020-07-28 ENCOUNTER — Ambulatory Visit: Payer: Medicare Other | Admitting: Physical Therapy

## 2020-07-30 ENCOUNTER — Ambulatory Visit: Payer: Medicare Other | Admitting: Physical Therapy

## 2020-08-04 ENCOUNTER — Ambulatory Visit: Payer: Medicare Other

## 2020-08-06 ENCOUNTER — Ambulatory Visit: Payer: Medicare Other

## 2020-09-09 ENCOUNTER — Emergency Department: Payer: Medicare Other

## 2020-09-09 ENCOUNTER — Inpatient Hospital Stay
Admission: EM | Admit: 2020-09-09 | Discharge: 2020-09-16 | DRG: 177 | Disposition: A | Discharge status: Home / Self Care | Payer: Medicare Other | Attending: Internal Medicine | Admitting: Internal Medicine

## 2020-09-09 ENCOUNTER — Other Ambulatory Visit: Payer: Self-pay

## 2020-09-09 DIAGNOSIS — B957 Other staphylococcus as the cause of diseases classified elsewhere: Secondary | ICD-10-CM | POA: Diagnosis present

## 2020-09-09 DIAGNOSIS — K5900 Constipation, unspecified: Secondary | ICD-10-CM | POA: Diagnosis not present

## 2020-09-09 DIAGNOSIS — Z794 Long term (current) use of insulin: Secondary | ICD-10-CM

## 2020-09-09 DIAGNOSIS — R319 Hematuria, unspecified: Secondary | ICD-10-CM | POA: Diagnosis not present

## 2020-09-09 DIAGNOSIS — R55 Syncope and collapse: Principal | ICD-10-CM | POA: Diagnosis present

## 2020-09-09 DIAGNOSIS — E1151 Type 2 diabetes mellitus with diabetic peripheral angiopathy without gangrene: Secondary | ICD-10-CM | POA: Diagnosis present

## 2020-09-09 DIAGNOSIS — R778 Other specified abnormalities of plasma proteins: Secondary | ICD-10-CM | POA: Diagnosis present

## 2020-09-09 DIAGNOSIS — I4510 Unspecified right bundle-branch block: Secondary | ICD-10-CM | POA: Diagnosis present

## 2020-09-09 DIAGNOSIS — Z833 Family history of diabetes mellitus: Secondary | ICD-10-CM

## 2020-09-09 DIAGNOSIS — I959 Hypotension, unspecified: Secondary | ICD-10-CM | POA: Diagnosis present

## 2020-09-09 DIAGNOSIS — Z79899 Other long term (current) drug therapy: Secondary | ICD-10-CM

## 2020-09-09 DIAGNOSIS — J440 Chronic obstructive pulmonary disease with acute lower respiratory infection: Secondary | ICD-10-CM | POA: Diagnosis present

## 2020-09-09 DIAGNOSIS — Z8673 Personal history of transient ischemic attack (TIA), and cerebral infarction without residual deficits: Secondary | ICD-10-CM

## 2020-09-09 DIAGNOSIS — E1129 Type 2 diabetes mellitus with other diabetic kidney complication: Secondary | ICD-10-CM | POA: Diagnosis present

## 2020-09-09 DIAGNOSIS — U071 COVID-19: Secondary | ICD-10-CM | POA: Diagnosis not present

## 2020-09-09 DIAGNOSIS — I248 Other forms of acute ischemic heart disease: Secondary | ICD-10-CM | POA: Diagnosis present

## 2020-09-09 DIAGNOSIS — N401 Enlarged prostate with lower urinary tract symptoms: Secondary | ICD-10-CM | POA: Diagnosis present

## 2020-09-09 DIAGNOSIS — Z7982 Long term (current) use of aspirin: Secondary | ICD-10-CM

## 2020-09-09 DIAGNOSIS — I951 Orthostatic hypotension: Secondary | ICD-10-CM | POA: Diagnosis present

## 2020-09-09 DIAGNOSIS — R7989 Other specified abnormal findings of blood chemistry: Secondary | ICD-10-CM | POA: Diagnosis present

## 2020-09-09 DIAGNOSIS — E785 Hyperlipidemia, unspecified: Secondary | ICD-10-CM | POA: Diagnosis not present

## 2020-09-09 DIAGNOSIS — R7881 Bacteremia: Secondary | ICD-10-CM

## 2020-09-09 DIAGNOSIS — M5136 Other intervertebral disc degeneration, lumbar region: Secondary | ICD-10-CM | POA: Diagnosis present

## 2020-09-09 DIAGNOSIS — I6381 Other cerebral infarction due to occlusion or stenosis of small artery: Secondary | ICD-10-CM | POA: Diagnosis not present

## 2020-09-09 DIAGNOSIS — Z7901 Long term (current) use of anticoagulants: Secondary | ICD-10-CM

## 2020-09-09 DIAGNOSIS — R338 Other retention of urine: Secondary | ICD-10-CM | POA: Diagnosis present

## 2020-09-09 DIAGNOSIS — I482 Chronic atrial fibrillation, unspecified: Secondary | ICD-10-CM | POA: Diagnosis present

## 2020-09-09 DIAGNOSIS — Z87891 Personal history of nicotine dependence: Secondary | ICD-10-CM

## 2020-09-09 DIAGNOSIS — I110 Hypertensive heart disease with heart failure: Secondary | ICD-10-CM | POA: Diagnosis present

## 2020-09-09 DIAGNOSIS — I5032 Chronic diastolic (congestive) heart failure: Secondary | ICD-10-CM | POA: Diagnosis present

## 2020-09-09 DIAGNOSIS — I10 Essential (primary) hypertension: Secondary | ICD-10-CM | POA: Diagnosis present

## 2020-09-09 DIAGNOSIS — E860 Dehydration: Secondary | ICD-10-CM | POA: Diagnosis present

## 2020-09-09 DIAGNOSIS — IMO0002 Reserved for concepts with insufficient information to code with codable children: Secondary | ICD-10-CM | POA: Diagnosis present

## 2020-09-09 LAB — CBC
HCT: 44.5 % (ref 39.0–52.0)
Hemoglobin: 14 g/dL (ref 13.0–17.0)
MCH: 31.7 pg (ref 26.0–34.0)
MCHC: 31.5 g/dL (ref 30.0–36.0)
MCV: 100.9 fL — ABNORMAL HIGH (ref 80.0–100.0)
Platelets: 191 10*3/uL (ref 150–400)
RBC: 4.41 MIL/uL (ref 4.22–5.81)
RDW: 13.4 % (ref 11.5–15.5)
WBC: 6.6 10*3/uL (ref 4.0–10.5)
nRBC: 0 % (ref 0.0–0.2)

## 2020-09-09 LAB — TROPONIN I (HIGH SENSITIVITY)
Troponin I (High Sensitivity): 212 ng/L (ref <18)
Troponin I (High Sensitivity): 264 ng/L (ref <18)

## 2020-09-09 LAB — BASIC METABOLIC PANEL
Anion gap: 12 (ref 5–15)
BUN: 16 mg/dL (ref 8–23)
CO2: 20 mmol/L — ABNORMAL LOW (ref 22–32)
Calcium: 8.5 mg/dL — ABNORMAL LOW (ref 8.9–10.3)
Chloride: 105 mmol/L (ref 98–111)
Creatinine, Ser: 1.06 mg/dL (ref 0.61–1.24)
GFR, Estimated: >60 mL/min (ref >60)
Glucose, Bld: 130 mg/dL — ABNORMAL HIGH (ref 70–99)
Potassium: 5.1 mmol/L (ref 3.5–5.1)
Sodium: 137 mmol/L (ref 135–145)

## 2020-09-09 LAB — LACTATE DEHYDROGENASE: LDH: 174 U/L (ref 98–192)

## 2020-09-09 LAB — FERRITIN: Ferritin: 56 ng/mL (ref 24–336)

## 2020-09-09 LAB — C-REACTIVE PROTEIN: CRP: 1.7 mg/dL — ABNORMAL HIGH (ref <1.0)

## 2020-09-09 LAB — BRAIN NATRIURETIC PEPTIDE
B Natriuretic Peptide: 301.3 pg/mL — ABNORMAL HIGH (ref 0.0–100.0)
B Natriuretic Peptide: 356.9 pg/mL — ABNORMAL HIGH (ref 0.0–100.0)

## 2020-09-09 LAB — HEPATITIS B SURFACE ANTIGEN: Hepatitis B Surface Ag: NONREACTIVE

## 2020-09-09 LAB — CBG MONITORING, ED
Glucose-Capillary: 157 mg/dL — ABNORMAL HIGH (ref 70–99)
Glucose-Capillary: 190 mg/dL — ABNORMAL HIGH (ref 70–99)

## 2020-09-09 LAB — APTT: aPTT: 31 seconds (ref 24–36)

## 2020-09-09 LAB — HEPARIN LEVEL (UNFRACTIONATED): Heparin Unfractionated: 1.29 IU/mL — ABNORMAL HIGH (ref 0.30–0.70)

## 2020-09-09 LAB — PROCALCITONIN: Procalcitonin: <0.1 ng/mL

## 2020-09-09 LAB — D-DIMER, QUANTITATIVE
D-Dimer, Quant: 0.93 ug/mL-FEU — ABNORMAL HIGH (ref 0.00–0.50)
D-Dimer, Quant: 0.94 ug/mL-FEU — ABNORMAL HIGH (ref 0.00–0.50)

## 2020-09-09 LAB — TRIGLYCERIDES: Triglycerides: 157 mg/dL — ABNORMAL HIGH (ref <150)

## 2020-09-09 LAB — FIBRINOGEN: Fibrinogen: 484 mg/dL — ABNORMAL HIGH (ref 210–475)

## 2020-09-09 LAB — POC SARS CORONAVIRUS 2 AG -  ED: SARS Coronavirus 2 Ag: POSITIVE — AB

## 2020-09-09 MED ORDER — ZINC SULFATE 220 (50 ZN) MG PO CAPS
220.0000 mg | ORAL_CAPSULE | Freq: Every day | ORAL | Status: DC
  Administered 2020-09-09 – 2020-09-16 (×8): 220 mg via ORAL
  Filled 2020-09-09 (×10): qty 1

## 2020-09-09 MED ORDER — LATANOPROST 0.005 % OP SOLN
1.0000 [drp] | Freq: Every day | OPHTHALMIC | Status: DC
  Administered 2020-09-12 – 2020-09-15 (×4): 1 [drp] via OPHTHALMIC
  Filled 2020-09-09 (×3): qty 2.5

## 2020-09-09 MED ORDER — ASPIRIN EC 81 MG PO TBEC
81.0000 mg | DELAYED_RELEASE_TABLET | Freq: Every day | ORAL | Status: DC
  Administered 2020-09-09 – 2020-09-15 (×7): 81 mg via ORAL
  Filled 2020-09-09 (×7): qty 1

## 2020-09-09 MED ORDER — ONDANSETRON HCL 4 MG/2ML IJ SOLN: 4.0000 mg | Freq: Three times a day (TID) | INTRAMUSCULAR | Status: DC | PRN

## 2020-09-09 MED ORDER — INSULIN ASPART 100 UNIT/ML ~~LOC~~ SOLN
0.0000 [IU] | Freq: Three times a day (TID) | SUBCUTANEOUS | Status: DC
  Administered 2020-09-09: 2 [IU] via SUBCUTANEOUS
  Administered 2020-09-11 – 2020-09-13 (×3): 1 [IU] via SUBCUTANEOUS
  Administered 2020-09-13 – 2020-09-14 (×3): 2 [IU] via SUBCUTANEOUS
  Administered 2020-09-14: 3 [IU] via SUBCUTANEOUS
  Administered 2020-09-15: 1 [IU] via SUBCUTANEOUS
  Administered 2020-09-16: 3 [IU] via SUBCUTANEOUS
  Administered 2020-09-16: 2 [IU] via SUBCUTANEOUS
  Filled 2020-09-09 (×11): qty 1

## 2020-09-09 MED ORDER — ASCORBIC ACID 500 MG PO TABS
500.0000 mg | ORAL_TABLET | Freq: Every day | ORAL | Status: DC
  Administered 2020-09-09 – 2020-09-16 (×8): 500 mg via ORAL
  Filled 2020-09-09 (×8): qty 1

## 2020-09-09 MED ORDER — DM-GUAIFENESIN ER 30-600 MG PO TB12
1.0000 | ORAL_TABLET | Freq: Two times a day (BID) | ORAL | Status: DC | PRN
  Administered 2020-09-12 – 2020-09-13 (×2): 1 via ORAL
  Filled 2020-09-09 (×2): qty 1

## 2020-09-09 MED ORDER — IPRATROPIUM BROMIDE HFA 17 MCG/ACT IN AERS
2.0000 | INHALATION_SPRAY | Freq: Four times a day (QID) | RESPIRATORY_TRACT | Status: DC
  Administered 2020-09-10 – 2020-09-16 (×25): 2 via RESPIRATORY_TRACT
  Filled 2020-09-09 (×2): qty 12.9

## 2020-09-09 MED ORDER — HEPARIN (PORCINE) 25000 UT/250ML-% IV SOLN
1400.0000 [IU]/h | INTRAVENOUS | Status: DC
  Administered 2020-09-09 – 2020-09-11 (×2): 1400 [IU]/h via INTRAVENOUS
  Filled 2020-09-09 (×3): qty 250

## 2020-09-09 MED ORDER — SODIUM CHLORIDE 0.9 % IV SOLN
200.0000 mg | Freq: Once | INTRAVENOUS | Status: DC
  Filled 2020-09-09: qty 40

## 2020-09-09 MED ORDER — ALBUTEROL SULFATE HFA 108 (90 BASE) MCG/ACT IN AERS
2.0000 | INHALATION_SPRAY | RESPIRATORY_TRACT | Status: DC | PRN
  Filled 2020-09-09 (×2): qty 6.7

## 2020-09-09 MED ORDER — HEPARIN BOLUS VIA INFUSION
4000.0000 [IU] | Freq: Once | INTRAVENOUS | Status: AC
  Administered 2020-09-09: 4000 [IU] via INTRAVENOUS
  Filled 2020-09-09: qty 4000

## 2020-09-09 MED ORDER — SODIUM CHLORIDE 0.9 % IV SOLN
100.0000 mg | Freq: Every day | INTRAVENOUS | Status: DC
  Filled 2020-09-09: qty 20

## 2020-09-09 MED ORDER — INSULIN GLARGINE 100 UNIT/ML ~~LOC~~ SOLN
50.0000 [IU] | Freq: Two times a day (BID) | SUBCUTANEOUS | Status: DC
  Administered 2020-09-09 – 2020-09-16 (×14): 50 [IU] via SUBCUTANEOUS
  Filled 2020-09-09 (×17): qty 0.5

## 2020-09-09 MED ORDER — ACETAMINOPHEN 650 MG RE SUPP: 650.0000 mg | Freq: Four times a day (QID) | RECTAL | Status: DC | PRN

## 2020-09-09 MED ORDER — INSULIN ASPART 100 UNIT/ML ~~LOC~~ SOLN
0.0000 [IU] | Freq: Every day | SUBCUTANEOUS | Status: DC
  Filled 2020-09-09 (×2): qty 1

## 2020-09-09 MED ORDER — VITAMIN B-12 100 MCG PO TABS
100.0000 ug | ORAL_TABLET | Freq: Every day | ORAL | Status: DC
  Administered 2020-09-10 – 2020-09-16 (×7): 100 ug via ORAL
  Filled 2020-09-09 (×7): qty 1

## 2020-09-09 MED ORDER — ATORVASTATIN CALCIUM 20 MG PO TABS
80.0000 mg | ORAL_TABLET | Freq: Every day | ORAL | Status: DC
  Administered 2020-09-09 – 2020-09-16 (×7): 80 mg via ORAL
  Filled 2020-09-09 (×7): qty 4
  Filled 2020-09-09: qty 1

## 2020-09-09 MED ORDER — HYDRALAZINE HCL 20 MG/ML IJ SOLN: 5.0000 mg | INTRAMUSCULAR | Status: DC | PRN

## 2020-09-09 MED ORDER — SODIUM CHLORIDE 0.9 % IV BOLUS
500.0000 mL | Freq: Once | INTRAVENOUS | Status: AC
  Administered 2020-09-09: 500 mL via INTRAVENOUS

## 2020-09-09 MED ORDER — APIXABAN 5 MG PO TABS: 5.0000 mg | ORAL_TABLET | Freq: Two times a day (BID) | ORAL | Status: DC

## 2020-09-09 NOTE — ED Notes (Signed)
RN Zella Ball informed of pt in room at this time.

## 2020-09-09 NOTE — H&P (Signed)
History and Physical    Mathew Baker [id number] DOB: October 04, 1940 DOA: 09/09/2020  Referring MD/NP/PA:   PCP: Kirk Ruths, MD   Patient coming from:  The patient is coming from home.  At baseline, pt is independent for most of ADL.        Chief Complaint: syncope and SOB  HPI: Mathew Baker is a 80 y.o. male with medical history significant of hypertension, hyperlipidemia, diabetes mellitus, stroke, atrial fibrillation on Eliquis, BPH, who presents with syncope and shortness of breath.  Patient states that he has been having cough, shortness of breath for more than 2 weeks.  He has chest pressure, denies active chest pain.  No fever or chills.  He has nausea, no vomiting, diarrhea or abdominal pain.  No symptoms of UTI.  No rectal bleeding or dark stool.  Patient states that he had lightheadedness, and passed out in the bathroom. He did did strike his head.  No unilateral numbness or tingling see extremities.  No facial droop or slurred speech. Patient was found to have hypotension with blood pressure 89/41 in ed, which improved to 139/79 later one without treatment.   ED Course: pt was found to have positive Covid Ag test, WBC 6.6, troponin 212, BNP 356, pending urinalysis, electrolytes renal function okay, temperature normal, heart rate 72, RR 15, oxygen saturation 94% on room air, chest x-ray negative.  CT head is negative for acute intracranial abnormalities.  CT of C-spine is negative for acute issues.  Patient is placed on MedSurg bed for observation.  Review of Systems:   General: no fevers, chills, no body weight gain, has poor appetite, has fatigue HEENT: no blurry vision, hearing changes or sore throat Respiratory: has dyspnea, coughing, no wheezing CV: has chest pain pressure, no palpitations GI: has nausea, no vomiting, abdominal pain, diarrhea, constipation GU: no dysuria, burning on urination, increased urinary frequency, hematuria  Ext: no leg edema Neuro: no  unilateral weakness, numbness, or tingling, no vision change or hearing loss Skin: no rash, no skin tear. MSK: No muscle spasm, no deformity, no limitation of range of movement in spin Heme: No easy bruising.  Travel history: No recent long distant travel.  Allergy: No Known Allergies  Past Medical History:  Diagnosis Date  . Chronic airway obstruction ([place])   . Degeneration of intervertebral disc of lumbar region   . Diabetes mellitus without complication ([place])   . Essential hypertension, benign   . Hyperlipidemia   . Hypertension   . Osteoarthritis of lower extremity   . Pneumonia, organism unspecified(486)   . PSA elevation 07/16/2013   Normal  . Stroke [facility])     Past Surgical History:  Procedure Laterality Date  . CATARACT EXTRACTION W/ INTRAOCULAR LENS  IMPLANT, BILATERAL    . COLONOSCOPY  2006   Normal: Repeat in 10 yrs  . LOOP RECORDER INSERTION N/A 02/21/2020   Procedure: LOOP RECORDER INSERTION;  Surgeon: Isaias Cowman, MD;  Location: [place] [facility];  Service: Cardiovascular;  Laterality: N/A;    Social History:  reports that he quit smoking about 62 years ago. He has never used smokeless tobacco. He reports that he does not drink alcohol and does not use drugs.  Family History:  Family History  Problem Relation Age of Onset  . Diabetes Mother      Prior to Admission medications   Medication Sig Start Date End Date Taking? Authorizing Provider  aspirin EC 81 MG tablet Take 81 mg by mouth at bedtime.  Swallow whole.   Yes [provider]  atorvastatin (LIPITOR) 80 MG tablet Take 80 mg by mouth daily.   Yes [provider]  ELIQUIS 5 MG TABS tablet Take 5 mg by mouth every 12 (twelve) hours. 08/18/20  Yes [provider]  insulin aspart (NOVOLOG) 100 UNIT/ML injection Inject 0-9 Units into the skin 3 (three) times daily with meals. Patient taking differently: Inject 0-6 Units into the skin 3 (three) times daily as needed for  high blood sugar. 03/25/19  Yes Katha Hamming, MD  insulin glargine (LANTUS) 100 UNIT/ML injection Inject 70 Units into the skin 2 (two) times daily.    Yes [provider]  latanoprost (XALATAN) 0.005 % ophthalmic solution Place 1 drop into both eyes at bedtime.   Yes [provider]  lisinopril-hydrochlorothiazide (ZESTORETIC) 10-12.5 MG tablet Take 1 tablet by mouth daily. 07/15/20  Yes [provider]  tamsulosin (FLOMAX) 0.4 MG CAPS capsule Take 0.4 mg by mouth at bedtime.   Yes [provider]  vitamin B-12 (CYANOCOBALAMIN) 100 MCG tablet Take 100 mcg by mouth daily.   Yes [provider]    Physical Exam: Vitals:   09/09/20 1800 09/09/20 1830 09/09/20 1839 09/09/20 1900  BP: (!) 115/47 (!) 135/111  110/61  Pulse: 64   68  Resp: 14 19  12   Temp:      TempSrc:      SpO2: 94%   96%  Weight:   104.8 kg   Height:    6\' 1"  (1.854 m)   General: Not in acute distress HEENT:       Eyes: PERRL, EOMI, no scleral icterus.       ENT: No discharge from the ears and nose, no pharynx injection, no tonsillar enlargement.        Neck: No JVD, no bruit, no mass felt. Heme: No neck lymph node enlargement. Cardiac: S1/S2, RRR, No murmurs, No gallops or rubs. Respiratory:  No rales, wheezing, rhonchi or rubs. GI: Soft, nondistended, nontender, no rebound pain, no organomegaly, BS present. GU: No hematuria Ext: No pitting leg edema bilaterally. 2+DP/PT pulse bilaterally. Musculoskeletal: No joint deformities, No joint redness or warmth, no limitation of ROM in spin. Skin: No rashes.  Neuro: Alert, oriented X3, cranial nerves II-XII grossly intact, moves all extremities normally. Psych: Patient is not psychotic, no suicidal or hemocidal ideation.  Labs on Admission: I have personally reviewed following labs and imaging studies  CBC: Recent Labs  Lab 09/09/20 1144  WBC 6.6  HGB 14.0  HCT 44.5  MCV 100.9*  PLT 191   Basic Metabolic  Panel: Recent Labs  Lab 09/09/20 1144  NA 137  K 5.1  CL 105  CO2 20*  GLUCOSE 130*  BUN 16  CREATININE 1.06  CALCIUM 8.5*   GFR: Estimated Creatinine Clearance: 70.7 mL/min (by C-G formula based on SCr of 1.06 mg/dL). Liver Function Tests: No results for input(s): AST, ALT, ALKPHOS, BILITOT, PROT, ALBUMIN in the last 168 hours. No results for input(s): LIPASE, AMYLASE in the last 168 hours. No results for input(s): AMMONIA in the last 168 hours. Coagulation Profile: No results for input(s): INR, PROTIME in the last 168 hours. Cardiac Enzymes: No results for input(s): CKTOTAL, CKMB, CKMBINDEX, TROPONINI in the last 168 hours. BNP (last 3 results) No results for input(s): PROBNP in the last 8760 hours. HbA1C: No results for input(s): HGBA1C in the last 72 hours. CBG: Recent Labs  Lab 09/09/20 1829  GLUCAP 157*  Lipid Profile: Recent Labs    09/09/20 1758  TRIG 157*   Thyroid Function Tests: No results for input(s): TSH, T4TOTAL, FREET4, T3FREE, THYROIDAB in the last 72 hours. Anemia Panel: Recent Labs    09/09/20 1758  FERRITIN 56   Urine analysis:    Component Value Date/Time   COLORURINE YELLOW (A) 04/15/2019 1442   APPEARANCEUR CLOUDY (A) 04/15/2019 1442   LABSPEC 1.011 04/15/2019 1442   PHURINE 8.0 04/15/2019 1442   GLUCOSEU NEGATIVE 04/15/2019 1442   HGBUR NEGATIVE 04/15/2019 1442   BILIRUBINUR NEGATIVE 04/15/2019 1442   KETONESUR NEGATIVE 04/15/2019 1442   PROTEINUR 30 (A) 04/15/2019 1442   NITRITE NEGATIVE 04/15/2019 1442   LEUKOCYTESUR LARGE (A) 04/15/2019 1442   Sepsis Labs: @LABRCNTIP (procalcitonin:4,lacticidven:4) )No results found for this or any previous visit (from the past 240 hour(s)).   Radiological Exams on Admission: DG Chest 2 View  Result Date: 09/09/2020 CLINICAL DATA:  SOB EXAM: CHEST - 2 VIEW COMPARISON:  12/20/2016 and prior. FINDINGS: No focal consolidation. No pneumothorax or pleural effusion. Stable cardiomediastinal  silhouette. Indwelling loop recorder. Multilevel spondylosis. IMPRESSION: No acute airspace disease. Electronically Signed   By: Primitivo Gauze M.D.   On: 09/09/2020 15:06   CT Head Wo Contrast  Result Date: 09/09/2020 CLINICAL DATA:  Syncope with fall.  Transient disorientation EXAM: CT HEAD WITHOUT CONTRAST TECHNIQUE: Contiguous axial images were obtained from the base of the skull through the vertex without intravenous contrast. COMPARISON:  Jan 09, 2020 FINDINGS: Brain: Age related volume loss is stable. There is no demonstrable intracranial mass, hemorrhage, extra-axial fluid collection, or midline shift. There is stable encephalomalacia along the inferior cerebellar vermis and adjacent inferior cerebellum bilaterally near the midline. Prior small infarct in the posteromedial mid right cerebellum stable. Prior small lacunar infarct in the anterior limb of the right external capsule is stable. Mild small vessel disease in the centra semiovale bilaterally is stable. No acute appearing infarct is demonstrable on this study. Vascular: No hyperdense vessel. Calcification in the distal left vertebral artery and carotid siphon regions bilaterally noted. Skull: Bony calvarium appears intact. Sinuses/Orbits: Mucosal thickening and opacification noted in several ethmoid air cells. Mucosal thickening noted in the right sphenoid sinus. Small retention cyst in the anterior left sphenoid sinus. Visualized orbits appear symmetric bilaterally. Other: Mastoid air cells are clear. IMPRESSION: Age related volume loss with slight periventricular small vessel disease, stable. Prior posterior midline cerebellar infarct. Small nearby right posteromedial lacunar infarct in the cerebellum. Prior small infarct in the anterior limb of the right external capsule. No evident acute infarct. No mass or hemorrhage. There are foci of arterial vascular calcification. There is paranasal sinus disease at several sites. Electronically  Signed   By: Lowella Grip III M.D.   On: 09/09/2020 12:10   CT Cervical Spine Wo Contrast  Result Date: 09/09/2020 CLINICAL DATA:  Syncope, fell EXAM: CT CERVICAL SPINE WITHOUT CONTRAST TECHNIQUE: Multidetector CT imaging of the cervical spine was performed without intravenous contrast. Multiplanar CT image reconstructions were also generated. COMPARISON:  None. FINDINGS: Alignment: No spondylolisthesis. Mild reversal of the normal cervical lordosis. Skull base and vertebrae: No fracture or focal bone lesion. Exuberant anterior endplate spurring at all cervical levels. Soft tissues and spinal canal: No prevertebral hematoma or soft tissue swelling. Coarse carotid bifurcation atheromatous calcifications. Disc levels: Posterior protrusions and associated endplate spurring [id number]. Mild narrowing C3-4, moderate narrowing C4-C7 interspaces. Facet and uncovertebral DJD contribute to osseous foraminal stenosis most marked left C3-4, right C4-5, left greater than  right C5-C7. Upper chest: Negative. Other: None IMPRESSION: 1. Negative for fracture or other acute bone abnormality. 2. Multilevel cervical spondylitic changes as above. 3. Loss of the normal cervical spine lordosis, which may be secondary to positioning, spasm, or soft tissue injury. 4. Bilateral carotid  calcifications. Electronically Signed   By: Lucrezia Europe M.D.   On: 09/09/2020 15:11     EKG: I have personally reviewed.  Not done in ED, will get one.   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  Chest x-ray negative for infiltration.  Will not start remdesivir and will steroid.  Will check  inflammatory markers.  -will based on MedSurg bed for position -vitamin C, zinc.  -Bronchodilators -PRN Mucinex for cough -f/u Blood culture -Gentle IV fluid:  -D-dimer, BNP,Trop, LFT, CRP, LDH, Procalcitonin, Ferritin, fibinogen, TG, Hep B SAg, HIV ab -Daily CRP, Ferritin, D-dimer, -If patient develops oxygen desaturation, Will ask the patient to maintain an awake prone position for 16+ hours a day, if possible, with a minimum of 2-3 hours at a time -Will attempt to maintain euvolemia to a net negative fluid status  Uncontrolled type 2 diabetes mellitus with microalbuminuria ([place]): Recent A1c 9.4, poorly controlled.  Patient still taking NovoLog and Lantus -Sliding scale insulin -Decrease Lantus dose from 70 to 50 units twice daily  HTN (hypertension): -Hold blood pressure medications due to hypotension -IV hydralazine as needed  HLD (hyperlipidemia) -Lipitor  Atrial fibrillation, chronic: Patient is taking Eliquis, not nodal blockers.  Heart rate is 72 -Switch Eliquis to IV heparin due to elevated troponin  Elevated troponin: Troponin 212, no chest pain, possibly due to demand ischemia.  Dr. Nehemiah Massed of cardiology is consulted -Switch Eliquis to IV heparin -Trend troponin -Aspirin, Lipitor -Check A1c, FLP  Hypotension: Blood pressure 89/41, which improved to 139/79.  Etiology is not clear.  Patient does not seem to have sepsis clinically.  Possibly due to dehydration and continuation of Zestoretic -IV fluid: 500 cc normal saline -Monitor blood pressure closely -Hold Zestoretic  Syncope: No focal neuro deficit on physical examination.  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  CT of C-spine is negative.  Possibly due to hypotension.  Patient is on Eliquis, low suspicions for PE.  Will check D-dimer.  -PT/OT -Treat hypotension    DVT ppx: on IV Heparin      Code Status: Full code Family Communication: I called patient's  daughter who did not pick up the phone, I left a message.   Disposition Plan:  Anticipate discharge back to previous environment Consults called: Dr. Nehemiah Massed of cardiology Admission status: Med-surg bed for obs   Status is: Observation  The patient remains OBS appropriate and will d/c before 2 midnights.  Dispo: The patient is from: Home              Anticipated d/c is to: Home              Anticipated d/c date is: 1 day              Patient currently is not medically stable to d/c.  Date of Service 09/09/2020    Ivor Costa [facility]   If 7PM-7AM, please contact night-coverage [email or web address] 09/09/2020, 7:25 PM

## 2020-09-09 NOTE — ED Provider Notes (Signed)
[facility] Emergency Department Provider Note   ____________________________________________   Event Date/Time   First MD Initiated Contact with Patient 09/09/20 1416     (approximate)  I have reviewed the triage vital signs and the nursing notes.   HISTORY  Chief Complaint Fall    HPI Mathew Baker is a 80 y.o. male with past medical history of hypertension, hyperlipidemia, diabetes, COPD, and stroke who presents to the ED following syncopal episode.  Patient reports that he has been having pressure in his chest, shortness of breath, and cough for about the past week.  He then started feeling lightheaded today and while standing at his bathroom sink, began to feel like he was going to pass out.  He was able to lower himself to the ground but did strike his head upon doing so.  He briefly lost consciousness, remembers waking up on the ground.  Blood pressure was noted to be low with EMS, but improved by the time he arrived to the ED.  He denies any fevers, nausea, vomiting, or diarrhea.        Past Medical History:  Diagnosis Date  . Chronic airway obstruction ([place])   . Degeneration of intervertebral disc of lumbar region   . Diabetes mellitus without complication ([place])   . Essential hypertension, benign   . Hyperlipidemia   . Hypertension   . Osteoarthritis of lower extremity   . Pneumonia, organism unspecified(486)   . PSA elevation 07/16/2013   Normal  . Stroke [facility])     Patient Active Problem List   Diagnosis Date Noted  . COVID-19 virus infection 09/09/2020  . Syncope 09/09/2020  . Bradycardia 03/19/2019  . Chest pain 12/20/2016  . HTN (hypertension) 12/20/2016  . HLD (hyperlipidemia) 12/20/2016  . Syncope and collapse 12/20/2016  . Uncontrolled type 2 diabetes mellitus with microalbuminuria ([place]) 08/21/2014    Past Surgical History:  Procedure Laterality Date  . CATARACT EXTRACTION W/ INTRAOCULAR LENS  IMPLANT, BILATERAL    .  COLONOSCOPY  2006   Normal: Repeat in 10 yrs  . LOOP RECORDER INSERTION N/A 02/21/2020   Procedure: LOOP RECORDER INSERTION;  Surgeon: Isaias Cowman, MD;  Location: [place] [facility];  Service: Cardiovascular;  Laterality: N/A;    Prior to Admission medications   Medication Sig Start Date End Date Taking? Authorizing Provider  aspirin EC 81 MG tablet Take 81 mg by mouth at bedtime. Swallow whole.   Yes [provider]  atorvastatin (LIPITOR) 80 MG tablet Take 80 mg by mouth daily.   Yes [provider]  ELIQUIS 5 MG TABS tablet Take 5 mg by mouth every 12 (twelve) hours. 08/18/20  Yes [provider]  insulin aspart (NOVOLOG) 100 UNIT/ML injection Inject 0-9 Units into the skin 3 (three) times daily with meals. Patient taking differently: Inject 0-6 Units into the skin 3 (three) times daily as needed for high blood sugar. 03/25/19  Yes Epifanio Lesches, MD  insulin glargine (LANTUS) 100 UNIT/ML injection Inject 70 Units into the skin 2 (two) times daily.    Yes [provider]  latanoprost (XALATAN) 0.005 % ophthalmic solution Place 1 drop into both eyes at bedtime.   Yes [provider]  lisinopril-hydrochlorothiazide (ZESTORETIC) 10-12.5 MG tablet Take 1 tablet by mouth daily. 07/15/20  Yes [provider]  tamsulosin (FLOMAX) 0.4 MG CAPS capsule Take 0.4 mg by mouth at bedtime.   Yes [provider]  vitamin B-12 (CYANOCOBALAMIN) 100 MCG tablet Take 100 mcg  by mouth daily.   Yes [provider]    Allergies Patient has no known allergies.  Family History  Problem Relation Age of Onset  . Diabetes Mother     Social History Social History   Tobacco Use  . Smoking status: Former Smoker    Quit date: 09/06/1958    Years since quitting: 62.0  . Smokeless tobacco: Never Used  Substance Use Topics  . Alcohol use: No    Alcohol/week: 0.0 standard drinks  . Drug use: No    Review of  Systems  Constitutional: No fever/chills Eyes: No visual changes. ENT: No sore throat. Cardiovascular: Positive for chest pain.  Positive for lightheadedness and syncope. Respiratory: Positive for cough and shortness of breath. Gastrointestinal: No abdominal pain.  No nausea, no vomiting.  No diarrhea.  No constipation. Genitourinary: Negative for dysuria. Musculoskeletal: Negative for back pain. Skin: Negative for rash. Neurological: Negative for headaches, focal weakness or numbness.  ____________________________________________   PHYSICAL EXAM:  VITAL SIGNS: ED Triage Vitals  Enc Vitals Group     BP 09/09/20 1143 122/77     Pulse Rate 09/09/20 1143 71     Resp 09/09/20 1143 18     Temp 09/09/20 1143 98.1 F (36.7 C)     Temp Source 09/09/20 1143 Oral     SpO2 09/09/20 1143 100 %     Weight --      Height --      Head Circumference --      Peak Flow --      Pain Score 09/09/20 1140 5     Pain Loc --      Pain Edu? --      Excl. in [place]? --     Constitutional: Alert and oriented. Eyes: Conjunctivae are normal. Head: Atraumatic. Nose: No congestion/rhinnorhea. Mouth/Throat: Mucous membranes are moist. Neck: Normal ROM Cardiovascular: Normal rate, regular rhythm. Grossly normal heart sounds.  2+ radial pulses bilaterally. Respiratory: Normal respiratory effort.  No retractions. Lungs CTAB. Gastrointestinal: Soft and nontender. No distention. Genitourinary: deferred Musculoskeletal: No lower extremity tenderness nor edema. Neurologic:  Normal speech and language. No gross focal neurologic deficits are appreciated. Skin:  Skin is warm, dry and intact. No rash noted. Psychiatric: Mood and affect are normal. Speech and behavior are normal.  ____________________________________________   LABS (all labs ordered are listed, but only abnormal results are displayed)  Labs Reviewed  BASIC METABOLIC PANEL - Abnormal; Notable for the following components:      Result  Value   CO2 20 (*)    Glucose, Bld 130 (*)    Calcium 8.5 (*)    All other components within normal limits  CBC - Abnormal; Notable for the following components:   MCV 100.9 (*)    All other components within normal limits  BRAIN NATRIURETIC PEPTIDE - Abnormal; Notable for the following components:   B Natriuretic Peptide 301.3 (*)    All other components within normal limits  POC SARS CORONAVIRUS 2 AG -  ED - Abnormal; Notable for the following components:   SARS Coronavirus 2 Ag Positive (*)    All other components within normal limits  URINALYSIS, COMPLETE (UACMP) WITH MICROSCOPIC  CBG MONITORING, ED  TROPONIN I (HIGH SENSITIVITY)   ____________________________________________  EKG  ED ECG REPORT I, Blake Divine, the attending physician, personally viewed and interpreted this ECG.   Date: 09/09/2020  EKG Time: 11:45  Rate: 72  Rhythm: normal sinus rhythm  Axis: LAD  Intervals:Incomplete RBBB  ST&T Change: None   PROCEDURES  Procedure(s) performed (including Critical Care):  Procedures   ____________________________________________   INITIAL IMPRESSION / ASSESSMENT AND PLAN / ED COURSE       80 year old male with past medical history of hypertension, hyperlipidemia, diabetes, COPD, and stroke who presents to the ED following syncopal episode at home today.  He also reports occasional chest pressure, shortness of breath, and cough throughout this past week.  EKG shows no arrhythmia or ischemia, CT scans of head and neck are negative for acute process.  Chest x-ray reviewed by me and shows no infiltrate, edema, or effusion.  Testing for COVID-19 is positive, which explains his cough, shortness of breath, and occasional chest pressure this week.  Given his syncopal episode today along with his advanced age and comorbidities, case discussed with hospitalist for admission.      ____________________________________________   FINAL CLINICAL IMPRESSION(S) / ED  DIAGNOSES  Final diagnoses:  Syncope, unspecified syncope type  COVID-19     ED Discharge Orders    None       Note:  This document was prepared using Dragon voice recognition software and may include unintentional dictation errors.   Chesley Noon, MD 09/09/20 [id number]

## 2020-09-09 NOTE — Consult Note (Signed)
Remdesivir - Pharmacy Brief Note  PMH HTN, HLD, T2DM, COPD, and stroke presenting with syncopal episode fall with brief LOC and c/o CP, SOB, and cough x1 week.  O:  ALT: NNL (01/09/2020 BL 19) CXR: Per CT Chest - "no acute airspace disease"; however COVID test positive. SpO2: 100% on RA   A/P:  Remdesivir 200 mg IVPB once followed by 100 mg IVPB daily x 4 days.   Martyn Malay, [facility] 09/09/2020 3:45 PM

## 2020-09-09 NOTE — Progress Notes (Signed)
ANTICOAGULATION CONSULT NOTE - Initial Consult  Pharmacy Consult for Heparin  Indication: chest pain/ACS  No Known Allergies  Patient Measurements: Height: 6\' 1"  (185.4 cm) Weight: 104.8 kg (231 lb) IBW/kg (Calculated) : 79.9 Heparin Dosing Weight: 104.8 kg   Vital Signs: Temp: 98.1 F (36.7 C) (01/04 1143) Temp Source: Oral (01/04 1143) BP: 110/61 (01/04 1900) Pulse Rate: 68 (01/04 1900)  Labs: Recent Labs    09/09/20 1144 09/09/20 1523 09/09/20 1758  HGB 14.0  --   --   HCT 44.5  --   --   PLT 191  --   --   APTT  --   --  31  HEPARINUNFRC  --   --  1.29*  CREATININE 1.06  --   --   TROPONINIHS  --  212* 264*    Estimated Creatinine Clearance: 70.7 mL/min (by C-G formula based on SCr of 1.06 mg/dL).   Medical History: Past Medical History:  Diagnosis Date  . Chronic airway obstruction (HCC)   . Degeneration of intervertebral disc of lumbar region   . Diabetes mellitus without complication (HCC)   . Essential hypertension, benign   . Hyperlipidemia   . Hypertension   . Osteoarthritis of lower extremity   . Pneumonia, organism unspecified(486)   . PSA elevation 07/16/2013   Normal  . Stroke [facility])     Medications:  (Not in a hospital admission)   Assessment: Pharmacy consulted to dose heparin in this 80 year old male admitted with NSTEMI.   CrCl = 70.7 ml/min Pt was on Eliquis 5 mg PO BID PTA, last dose on 1/3 at unknown time.    1/4 @ 1758:  APTT = 31,   HL = 1.29   1/4 @ 1144:  Hgb = 14.0 ,  Platelets = 191   Goal of Therapy:  APTT :  66 - 102  Heparin level 0.3-0.7 units/ml Monitor platelets by anticoagulation protocol: Yes   Plan:  Heparin 4000 units IV X 1 bolus followed by heparin 1400 units/hr.  Will order aPTT 8 hrs after start of drip.  Will use aPTT to guide dosing until HL and aPTT are therapeutic.  Will check HL on 1/5 with AM labs.  Will monitor CBC daily.   Shuaib Corsino D 09/09/2020,7:17 PM

## 2020-09-09 NOTE — ED Triage Notes (Signed)
Pt via [facility] from home with c/o fall. Pt on eliquis. Pt hypotension. Pt had syncopal. Pt was initially disoriented. Pt now oriented.

## 2020-09-09 NOTE — Consult Note (Signed)
[facility] Consultation Note  Patient ID: Mathew Baker, MRN: [id number], DOB/AGE: 1940-11-06 80 y.o. Admit date: 09/09/2020   Date of Consult: 09/09/2020 Primary Physician: Lauro Regulus, MD Primary Cardiologist: Fath  Chief Complaint:  Chief Complaint  Patient presents with  . Fall   Reason for Consult: Syncope  HPI: 80 y.o. male with known hypertension hyperlipidemia diabetes COPD and peripheral vascular disease with carotid plaque by ultrasound who has had a bilateral cerebellar infarct in 2020.  The patient has had full work-up and appropriate medication management for further risk reduction in recurrent episodes including an echocardiogram showing normal LV systolic function with ejection fraction of 60% and no evidence of valvular heart disease.  Additionally the patient has had a Holter monitor showing preatrial contractions and preventricular contractions.  A Linq device was placed and there was a 16-minute episode of what appeared to be atrial fibrillation which could have been related to the patient's stroke.  Therefore anticoagulation was started at that time and the patient has done fairly well until recently when he had significant dizziness weakness fatigue and a little chest pressure.  When at his bathroom sink.  He lowered himself to the ground but did not strike his head with no evidence of significant major injury.  He did have seen at the emergency room with an EKG showing normal sinus rhythm with left axis deviation and incomplete right bundle branch block.  BNP was 301 chest x-ray was normal and troponin was 212.  Currently the patient feels well there is no evidence of significant worsening symptoms.  He has tested positive for Covid which may be related to a lot of his symptoms at this time and exacerbating his issues.  Past Medical History:  Diagnosis Date  . Chronic airway obstruction (HCC)   . Degeneration of intervertebral disc of lumbar region   .  Diabetes mellitus without complication (HCC)   . Essential hypertension, benign   . Hyperlipidemia   . Hypertension   . Osteoarthritis of lower extremity   . Pneumonia, organism unspecified(486)   . PSA elevation 07/16/2013   Normal  . Stroke [facility])       Surgical History:  Past Surgical History:  Procedure Laterality Date  . CATARACT EXTRACTION W/ INTRAOCULAR LENS  IMPLANT, BILATERAL    . COLONOSCOPY  2006   Normal: Repeat in 10 yrs  . LOOP RECORDER INSERTION N/A 02/21/2020   Procedure: LOOP RECORDER INSERTION;  Surgeon: Marcina Millard, MD;  Location: [facility] INVASIVE CV LAB;  Service: Cardiovascular;  Laterality: N/A;     Home Meds: Prior to Admission medications   Medication Sig Start Date End Date Taking? Authorizing Provider  aspirin EC 81 MG tablet Take 81 mg by mouth at bedtime. Swallow whole.   Yes [provider]  atorvastatin (LIPITOR) 80 MG tablet Take 80 mg by mouth daily.   Yes [provider]  ELIQUIS 5 MG TABS tablet Take 5 mg by mouth every 12 (twelve) hours. 08/18/20  Yes [provider]  insulin aspart (NOVOLOG) 100 UNIT/ML injection Inject 0-9 Units into the skin 3 (three) times daily with meals. Patient taking differently: Inject 0-6 Units into the skin 3 (three) times daily as needed for high blood sugar. 03/25/19  Yes Katha Hamming, MD  insulin glargine (LANTUS) 100 UNIT/ML injection Inject 70 Units into the skin 2 (two) times daily.    Yes [provider]  latanoprost (XALATAN) 0.005 % ophthalmic solution Place 1 drop into both eyes at  bedtime.   Yes [provider]  lisinopril-hydrochlorothiazide (ZESTORETIC) 10-12.5 MG tablet Take 1 tablet by mouth daily. 07/15/20  Yes [provider]  tamsulosin (FLOMAX) 0.4 MG CAPS capsule Take 0.4 mg by mouth at bedtime.   Yes [provider]  vitamin B-12 (CYANOCOBALAMIN) 100 MCG tablet Take 100 mcg by mouth daily.   Yes [provider]     Inpatient Medications:  . vitamin C  500 mg Oral Daily  . aspirin EC  81 mg Oral QHS  . atorvastatin  80 mg Oral Daily  . insulin aspart  0-5 Units Subcutaneous QHS  . insulin aspart  0-9 Units Subcutaneous TID WC  . insulin glargine  50 Units Subcutaneous BID  . ipratropium  2 puff Inhalation Q6H  . latanoprost  1 drop Both Eyes QHS  . vitamin B-12  100 mcg Oral Daily  . zinc sulfate  220 mg Oral Daily   . remdesivir 200 mg in sodium chloride 0.9% 250 mL IVPB     Followed by  . [START ON 09/10/2020] remdesivir 100 mg in NS 100 mL      Allergies: No Known Allergies  Social History   Socioeconomic History  . Marital status: Married    Spouse name: Not on file  . Number of children: Not on file  . Years of education: Not on file  . Highest education level: Not on file  Occupational History  . Not on file  Tobacco Use  . Smoking status: Former Smoker    Quit date: 09/06/1958    Years since quitting: 62.0  . Smokeless tobacco: Never Used  Substance and Sexual Activity  . Alcohol use: No    Alcohol/week: 0.0 standard drinks  . Drug use: No  . Sexual activity: Not on file  Other Topics Concern  . Not on file  Social History Narrative   Married   Retired; [employer] 31 yrs; One of the original [employer] in the early 60's   1 daughter   Social Determinants of Health   Financial Resource Strain: Not on file  Food Insecurity: Not on file  Transportation Needs: Not on file  Physical Activity: Not on file  Stress: Not on file  Social Connections: Not on file  Intimate Partner Violence: Not on file     Family History  Problem Relation Age of Onset  . Diabetes Mother      Review of Systems Positive for syncope Negative for: General:  chills, fever, night sweats or weight changes.  Cardiovascular: PND orthopnea positive for syncope dizziness  Dermatological skin lesions rashes Respiratory: Cough congestion Urologic: Frequent urination urination at night and  hematuria Abdominal: negative for nausea, vomiting, diarrhea, bright red blood per rectum, melena, or hematemesis Neurologic: negative for visual changes, and/or hearing changes  All other systems reviewed and are otherwise negative except as noted above.  Labs: No results for input(s): CKTOTAL, CKMB, TROPONINI in the last 72 hours. Lab Results  Component Value Date   WBC 6.6 09/09/2020   HGB 14.0 09/09/2020   HCT 44.5 09/09/2020   MCV 100.9 (H) 09/09/2020   PLT 191 09/09/2020    Recent Labs  Lab 09/09/20 1144  NA 137  K 5.1  CL 105  CO2 20*  BUN 16  CREATININE 1.06  CALCIUM 8.5*  GLUCOSE 130*   Lab Results  Component Value Date   CHOL 91 03/23/2019   HDL 30 (L) 03/23/2019   LDLCALC 46 03/23/2019   TRIG 75 03/23/2019  Lab Results  Component Value Date   DDIMER 0.75 (H) 12/20/2016    Radiology/Studies:  DG Chest 2 View  Result Date: 09/09/2020 CLINICAL DATA:  SOB EXAM: CHEST - 2 VIEW COMPARISON:  12/20/2016 and prior. FINDINGS: No focal consolidation. No pneumothorax or pleural effusion. Stable cardiomediastinal silhouette. Indwelling loop recorder. Multilevel spondylosis. IMPRESSION: No acute airspace disease. Electronically Signed   By: Primitivo Gauze M.D.   On: 09/09/2020 15:06   CT Head Wo Contrast  Result Date: 09/09/2020 CLINICAL DATA:  Syncope with fall.  Transient disorientation EXAM: CT HEAD WITHOUT CONTRAST TECHNIQUE: Contiguous axial images were obtained from the base of the skull through the vertex without intravenous contrast. COMPARISON:  Jan 09, 2020 FINDINGS: Brain: Age related volume loss is stable. There is no demonstrable intracranial mass, hemorrhage, extra-axial fluid collection, or midline shift. There is stable encephalomalacia along the inferior cerebellar vermis and adjacent inferior cerebellum bilaterally near the midline. Prior small infarct in the posteromedial mid right cerebellum stable. Prior small lacunar infarct in the anterior limb of  the right external capsule is stable. Mild small vessel disease in the centra semiovale bilaterally is stable. No acute appearing infarct is demonstrable on this study. Vascular: No hyperdense vessel. Calcification in the distal left vertebral artery and carotid siphon regions bilaterally noted. Skull: Bony calvarium appears intact. Sinuses/Orbits: Mucosal thickening and opacification noted in several ethmoid air cells. Mucosal thickening noted in the right sphenoid sinus. Small retention cyst in the anterior left sphenoid sinus. Visualized orbits appear symmetric bilaterally. Other: Mastoid air cells are clear. IMPRESSION: Age related volume loss with slight periventricular small vessel disease, stable. Prior posterior midline cerebellar infarct. Small nearby right posteromedial lacunar infarct in the cerebellum. Prior small infarct in the anterior limb of the right external capsule. No evident acute infarct. No mass or hemorrhage. There are foci of arterial vascular calcification. There is paranasal sinus disease at several sites. Electronically Signed   By: Lowella Grip III M.D.   On: 09/09/2020 12:10   CT Cervical Spine Wo Contrast  Result Date: 09/09/2020 CLINICAL DATA:  Syncope, fell EXAM: CT CERVICAL SPINE WITHOUT CONTRAST TECHNIQUE: Multidetector CT imaging of the cervical spine was performed without intravenous contrast. Multiplanar CT image reconstructions were also generated. COMPARISON:  None. FINDINGS: Alignment: No spondylolisthesis. Mild reversal of the normal cervical lordosis. Skull base and vertebrae: No fracture or focal bone lesion. Exuberant anterior endplate spurring at all cervical levels. Soft tissues and spinal canal: No prevertebral hematoma or soft tissue swelling. Coarse carotid bifurcation atheromatous calcifications. Disc levels: Posterior protrusions and associated endplate spurring [id number]. Mild narrowing C3-4, moderate narrowing C4-C7 interspaces. Facet and uncovertebral DJD  contribute to osseous foraminal stenosis most marked left C3-4, right C4-5, left greater than right C5-C7. Upper chest: Negative. Other: None IMPRESSION: 1. Negative for fracture or other acute bone abnormality. 2. Multilevel cervical spondylitic changes as above. 3. Loss of the normal cervical spine lordosis, which may be secondary to positioning, spasm, or soft tissue injury. 4. Bilateral carotid  calcifications. Electronically Signed   By: Lucrezia Europe M.D.   On: 09/09/2020 15:11    EKG: Normal sinus rhythm with left axis deviation and incomplete right bundle branch block  Weights: There were no vitals filed for this visit.   Physical Exam: Blood pressure (!) 147/81, pulse 65, temperature 98.1 F (36.7 C), temperature source Oral, resp. rate 15, SpO2 97 %. There is no height or weight on file to calculate BMI.      Assessment: 80 year old  male with diabetes hypertension hyperlipidemia COPD and previous cerebellar infarct with carotid plaque and apparent episode of atrial fibrillation by Linq device having acute episode of dizziness weakness fatigue and syncope possibly related to Covid infection and risks of above without evidence of current acute coronary syndrome or myocardial infarction or congestive heart failure  Plan: 1.  Continue serial ECG and enzymes to assess for possible rhythm disturbances causing weakness fatigue or syncope and/or possibility of myocardial infarction 2.  Continuation of medication management for cardiovascular risk factors as per above and from outpatient medications 3.  No further cardiac diagnostics necessary at this time 4.  Continue to treat for infection and subsequent concerns of symptoms listed above  Signed, Corey Skains M.D. [facility] 09/09/2020, 6:07 PM

## 2020-09-10 DIAGNOSIS — E785 Hyperlipidemia, unspecified: Secondary | ICD-10-CM | POA: Diagnosis present

## 2020-09-10 DIAGNOSIS — R809 Proteinuria, unspecified: Secondary | ICD-10-CM

## 2020-09-10 DIAGNOSIS — R338 Other retention of urine: Secondary | ICD-10-CM | POA: Diagnosis present

## 2020-09-10 DIAGNOSIS — R778 Other specified abnormalities of plasma proteins: Secondary | ICD-10-CM | POA: Diagnosis not present

## 2020-09-10 DIAGNOSIS — J440 Chronic obstructive pulmonary disease with acute lower respiratory infection: Secondary | ICD-10-CM | POA: Diagnosis present

## 2020-09-10 DIAGNOSIS — I959 Hypotension, unspecified: Secondary | ICD-10-CM | POA: Diagnosis present

## 2020-09-10 DIAGNOSIS — N401 Enlarged prostate with lower urinary tract symptoms: Secondary | ICD-10-CM | POA: Diagnosis present

## 2020-09-10 DIAGNOSIS — R55 Syncope and collapse: Secondary | ICD-10-CM | POA: Diagnosis present

## 2020-09-10 DIAGNOSIS — R7881 Bacteremia: Secondary | ICD-10-CM | POA: Diagnosis not present

## 2020-09-10 DIAGNOSIS — I5032 Chronic diastolic (congestive) heart failure: Secondary | ICD-10-CM | POA: Diagnosis present

## 2020-09-10 DIAGNOSIS — R319 Hematuria, unspecified: Secondary | ICD-10-CM | POA: Diagnosis not present

## 2020-09-10 DIAGNOSIS — I6381 Other cerebral infarction due to occlusion or stenosis of small artery: Secondary | ICD-10-CM | POA: Diagnosis not present

## 2020-09-10 DIAGNOSIS — Z79899 Other long term (current) drug therapy: Secondary | ICD-10-CM | POA: Diagnosis not present

## 2020-09-10 DIAGNOSIS — K5900 Constipation, unspecified: Secondary | ICD-10-CM | POA: Diagnosis not present

## 2020-09-10 DIAGNOSIS — I4510 Unspecified right bundle-branch block: Secondary | ICD-10-CM | POA: Diagnosis present

## 2020-09-10 DIAGNOSIS — E7800 Pure hypercholesterolemia, unspecified: Secondary | ICD-10-CM | POA: Diagnosis not present

## 2020-09-10 DIAGNOSIS — U071 COVID-19: Secondary | ICD-10-CM | POA: Diagnosis present

## 2020-09-10 DIAGNOSIS — E1151 Type 2 diabetes mellitus with diabetic peripheral angiopathy without gangrene: Secondary | ICD-10-CM | POA: Diagnosis present

## 2020-09-10 DIAGNOSIS — Z7901 Long term (current) use of anticoagulants: Secondary | ICD-10-CM | POA: Diagnosis not present

## 2020-09-10 DIAGNOSIS — E1129 Type 2 diabetes mellitus with other diabetic kidney complication: Secondary | ICD-10-CM

## 2020-09-10 DIAGNOSIS — I110 Hypertensive heart disease with heart failure: Secondary | ICD-10-CM | POA: Diagnosis present

## 2020-09-10 DIAGNOSIS — Z95818 Presence of other cardiac implants and grafts: Secondary | ICD-10-CM | POA: Diagnosis not present

## 2020-09-10 DIAGNOSIS — Z8673 Personal history of transient ischemic attack (TIA), and cerebral infarction without residual deficits: Secondary | ICD-10-CM | POA: Diagnosis not present

## 2020-09-10 DIAGNOSIS — Z833 Family history of diabetes mellitus: Secondary | ICD-10-CM | POA: Diagnosis not present

## 2020-09-10 DIAGNOSIS — Z7982 Long term (current) use of aspirin: Secondary | ICD-10-CM | POA: Diagnosis not present

## 2020-09-10 DIAGNOSIS — I482 Chronic atrial fibrillation, unspecified: Secondary | ICD-10-CM | POA: Diagnosis present

## 2020-09-10 DIAGNOSIS — E1165 Type 2 diabetes mellitus with hyperglycemia: Secondary | ICD-10-CM

## 2020-09-10 DIAGNOSIS — Z794 Long term (current) use of insulin: Secondary | ICD-10-CM | POA: Diagnosis not present

## 2020-09-10 DIAGNOSIS — Z87891 Personal history of nicotine dependence: Secondary | ICD-10-CM | POA: Diagnosis not present

## 2020-09-10 DIAGNOSIS — I248 Other forms of acute ischemic heart disease: Secondary | ICD-10-CM | POA: Diagnosis present

## 2020-09-10 DIAGNOSIS — B957 Other staphylococcus as the cause of diseases classified elsewhere: Secondary | ICD-10-CM | POA: Diagnosis present

## 2020-09-10 LAB — URINALYSIS, COMPLETE (UACMP) WITH MICROSCOPIC
Bacteria, UA: NONE SEEN
Bilirubin Urine: NEGATIVE
Glucose, UA: NEGATIVE mg/dL
Hgb urine dipstick: NEGATIVE
Ketones, ur: NEGATIVE mg/dL
Leukocytes,Ua: NEGATIVE
Nitrite: NEGATIVE
Protein, ur: 100 mg/dL — AB
Specific Gravity, Urine: 1.02 (ref 1.005–1.030)
pH: 5 (ref 5.0–8.0)

## 2020-09-10 LAB — CBC WITH DIFFERENTIAL/PLATELET
Abs Immature Granulocytes: 0.02 10*3/uL (ref 0.00–0.07)
Basophils Absolute: 0 10*3/uL (ref 0.0–0.1)
Basophils Relative: 1 %
Eosinophils Absolute: 0.2 10*3/uL (ref 0.0–0.5)
Eosinophils Relative: 4 %
HCT: 37.4 % — ABNORMAL LOW (ref 39.0–52.0)
Hemoglobin: 12.7 g/dL — ABNORMAL LOW (ref 13.0–17.0)
Immature Granulocytes: 0 %
Lymphocytes Relative: 44 %
Lymphs Abs: 2.3 10*3/uL (ref 0.7–4.0)
MCH: 32.8 pg (ref 26.0–34.0)
MCHC: 34 g/dL (ref 30.0–36.0)
MCV: 96.6 fL (ref 80.0–100.0)
Monocytes Absolute: 0.7 10*3/uL (ref 0.1–1.0)
Monocytes Relative: 12 %
Neutro Abs: 2 10*3/uL (ref 1.7–7.7)
Neutrophils Relative %: 39 %
Platelets: 211 10*3/uL (ref 150–400)
RBC: 3.87 MIL/uL — ABNORMAL LOW (ref 4.22–5.81)
RDW: 13.2 % (ref 11.5–15.5)
WBC: 5.2 10*3/uL (ref 4.0–10.5)
nRBC: 0 % (ref 0.0–0.2)

## 2020-09-10 LAB — BLOOD CULTURE ID PANEL (REFLEXED) - BCID2

## 2020-09-10 LAB — CBG MONITORING, ED
Glucose-Capillary: 130 mg/dL — ABNORMAL HIGH (ref 70–99)
Glucose-Capillary: 146 mg/dL — ABNORMAL HIGH (ref 70–99)
Glucose-Capillary: 158 mg/dL — ABNORMAL HIGH (ref 70–99)

## 2020-09-10 LAB — C-REACTIVE PROTEIN: CRP: 1 mg/dL — ABNORMAL HIGH (ref <1.0)

## 2020-09-10 LAB — COMPREHENSIVE METABOLIC PANEL
ALT: 14 U/L (ref 0–44)
AST: 27 U/L (ref 15–41)
Albumin: 3 g/dL — ABNORMAL LOW (ref 3.5–5.0)
Alkaline Phosphatase: 49 U/L (ref 38–126)
Anion gap: 7 (ref 5–15)
BUN: 21 mg/dL (ref 8–23)
CO2: 25 mmol/L (ref 22–32)
Calcium: 8.2 mg/dL — ABNORMAL LOW (ref 8.9–10.3)
Chloride: 109 mmol/L (ref 98–111)
Creatinine, Ser: 0.99 mg/dL (ref 0.61–1.24)
GFR, Estimated: >60 mL/min (ref >60)
Glucose, Bld: 139 mg/dL — ABNORMAL HIGH (ref 70–99)
Potassium: 3.9 mmol/L (ref 3.5–5.1)
Sodium: 141 mmol/L (ref 135–145)
Total Bilirubin: 1.1 mg/dL (ref 0.3–1.2)
Total Protein: 6.4 g/dL — ABNORMAL LOW (ref 6.5–8.1)

## 2020-09-10 LAB — D-DIMER, QUANTITATIVE: D-Dimer, Quant: 0.75 ug/mL-FEU — ABNORMAL HIGH (ref 0.00–0.50)

## 2020-09-10 LAB — TROPONIN I (HIGH SENSITIVITY): Troponin I (High Sensitivity): 250 ng/L (ref <18)

## 2020-09-10 LAB — FERRITIN: Ferritin: 55 ng/mL (ref 24–336)

## 2020-09-10 LAB — MAGNESIUM: Magnesium: 1.8 mg/dL (ref 1.7–2.4)

## 2020-09-10 LAB — HEPARIN LEVEL (UNFRACTIONATED): Heparin Unfractionated: 1.32 IU/mL — ABNORMAL HIGH (ref 0.30–0.70)

## 2020-09-10 LAB — APTT
aPTT: 126 seconds — ABNORMAL HIGH (ref 24–36)
aPTT: 87 seconds — ABNORMAL HIGH (ref 24–36)
aPTT: 46 seconds — ABNORMAL HIGH (ref 24–36)

## 2020-09-10 LAB — HEMOGLOBIN A1C
Hgb A1c MFr Bld: 8 % — ABNORMAL HIGH (ref 4.8–5.6)
Mean Plasma Glucose: 182.9 mg/dL

## 2020-09-10 LAB — GLUCOSE, CAPILLARY: Glucose-Capillary: 149 mg/dL — ABNORMAL HIGH (ref 70–99)

## 2020-09-10 MED ORDER — TAMSULOSIN HCL 0.4 MG PO CAPS
0.4000 mg | ORAL_CAPSULE | Freq: Every day | ORAL | Status: DC
  Administered 2020-09-10 – 2020-09-16 (×7): 0.4 mg via ORAL
  Filled 2020-09-10 (×7): qty 1

## 2020-09-10 NOTE — ED Notes (Signed)
cbg 130 

## 2020-09-10 NOTE — ED Notes (Signed)
Admitting MD messaged about bladder scan result of >752. No response, no new orders at this time

## 2020-09-10 NOTE — ED Notes (Signed)
ED TO INPATIENT HANDOFF REPORT  ED Nurse Name and Phone #: Briasia Flinders [phone]  S Name/Age/Gender Mathew Baker 80 y.o. male Room/Bed: ED32A/ED32A  Code Status   Code Status: Full Code  Home/SNF/Other Home Patient oriented to: self, place, time and situation Is this baseline? Yes   Triage Complete: Triage complete  Chief Complaint COVID-19 virus infection [U07.1]  Triage Note Pt via [facility] from home with c/o fall. Pt on eliquis. Pt hypotension. Pt had syncopal. Pt was initially disoriented. Pt now oriented.    Allergies No Known Allergies  Level of Care/Admitting Diagnosis ED Disposition    ED Disposition Condition Comment   Admit  Hospital Area: [place] [[id number]]  Level of Care: Med-Surg [16]  Covid Evaluation: Confirmed COVID Positive  Diagnosis: COVID-19 virus infection [[id number]]  Admitting Physician: Ivor Costa [place]  Attending Physician: Ivor Costa [[id number]]       B Medical/Surgery History Past Medical History:  Diagnosis Date  . Chronic airway obstruction ([place])   . Degeneration of intervertebral disc of lumbar region   . Diabetes mellitus without complication ([place])   . Essential hypertension, benign   . Hyperlipidemia   . Hypertension   . Osteoarthritis of lower extremity   . Pneumonia, organism unspecified(486)   . PSA elevation 07/16/2013   Normal  . Stroke [facility])    Past Surgical History:  Procedure Laterality Date  . CATARACT EXTRACTION W/ INTRAOCULAR LENS  IMPLANT, BILATERAL    . COLONOSCOPY  2006   Normal: Repeat in 10 yrs  . LOOP RECORDER INSERTION N/A 02/21/2020   Procedure: LOOP RECORDER INSERTION;  Surgeon: Isaias Cowman, MD;  Location: [facility];  Service: Cardiovascular;  Laterality: N/A;     A IV Location/Drains/Wounds Patient Lines/Drains/Airways Status    Active Line/Drains/Airways    Name Placement date Placement time Site Days   Peripheral IV 09/09/20 Right Antecubital 09/09/20  1524   Antecubital  1   Peripheral IV 09/10/20 Left Forearm 09/10/20  0954  Forearm  less than 1   Urethral Catheter Brianna, RN Non-latex 16 Fr. 09/10/20  1415  Non-latex  less than 1          Intake/Output Last 24 hours  Intake/Output Summary (Last 24 hours) at 09/10/2020 1636 Last data filed at 09/10/2020 0600 Gross per 24 hour  Intake 176.35 ml  Output -  Net 176.35 ml    Labs/Imaging Results for orders placed or performed during the hospital encounter of 09/09/20 (from the past 48 hour(s))  Urinalysis, Complete w Microscopic     Status: Abnormal   Collection Time: 09/09/20 11:42 AM  Result Value Ref Range   Color, Urine YELLOW (A) YELLOW   APPearance HAZY (A) CLEAR   Specific Gravity, Urine 1.020 1.005 - 1.030   pH 5.0 5.0 - 8.0   Glucose, UA NEGATIVE NEGATIVE mg/dL   Hgb urine dipstick NEGATIVE NEGATIVE   Bilirubin Urine NEGATIVE NEGATIVE   Ketones, ur NEGATIVE NEGATIVE mg/dL   Protein, ur 100 (A) NEGATIVE mg/dL   Nitrite NEGATIVE NEGATIVE   Leukocytes,Ua NEGATIVE NEGATIVE   RBC / HPF 0-5 0 - 5 RBC/hpf   WBC, UA 0-5 0 - 5 WBC/hpf   Bacteria, UA NONE SEEN NONE SEEN   Squamous Epithelial / LPF 0-5 0 - 5   Mucus PRESENT     Comment: Performed at [facility], [address], [place], [place] [address]  Basic metabolic panel     Status: Abnormal   Collection Time: 09/09/20  11:44 AM  Result Value Ref Range   Sodium 137 135 - 145 mmol/L   Potassium 5.1 3.5 - 5.1 mmol/L   Chloride 105 98 - 111 mmol/L   CO2 20 (L) 22 - 32 mmol/L   Glucose, Bld 130 (H) 70 - 99 mg/dL    Comment: Glucose reference range applies only to samples taken after fasting for at least 8 hours.   BUN 16 8 - 23 mg/dL   Creatinine, Ser 1.06 0.61 - 1.24 mg/dL   Calcium 8.5 (L) 8.9 - 10.3 mg/dL   GFR, Estimated >60 >60 mL/min    Comment: (NOTE) Calculated using the CKD-EPI Creatinine Equation (2021)    Anion gap 12 5 - 15    Comment: Performed at [facility], [address]  [place], [place] [address]  CBC     Status: Abnormal   Collection Time: 09/09/20 11:44 AM  Result Value Ref Range   WBC 6.6 4.0 - 10.5 K/uL   RBC 4.41 4.22 - 5.81 MIL/uL   Hemoglobin 14.0 13.0 - 17.0 g/dL   HCT 44.5 39.0 - 52.0 %   MCV 100.9 (H) 80.0 - 100.0 fL   MCH 31.7 26.0 - 34.0 pg   MCHC 31.5 30.0 - 36.0 g/dL   RDW 13.4 11.5 - 15.5 %   Platelets 191 150 - 400 K/uL   nRBC 0.0 0.0 - 0.2 %    Comment: Performed at [facility], [address] [place], [place] [address]  Brain natriuretic peptide     Status: Abnormal   Collection Time: 09/09/20 11:44 AM  Result Value Ref Range   B Natriuretic Peptide 301.3 (H) 0.0 - 100.0 pg/mL    Comment: Performed at [facility], [place], [place] [address]  POC SARS Coronavirus 2 Ag-ED - Nasal Swab (BD Veritor Kit)     Status: Abnormal   Collection Time: 09/09/20  3:04 PM  Result Value Ref Range   SARS Coronavirus 2 Ag Positive (A) Negative  Troponin I (High Sensitivity)     Status: Abnormal   Collection Time: 09/09/20  3:23 PM  Result Value Ref Range   Troponin I (High Sensitivity) 212 (HH) <18 ng/L    Comment: CRITICAL RESULT CALLED TO, READ BACK BY AND VERIFIED WITH ROBIN REGISTER,RN 1618 09/09/2020 DB (NOTE) Elevated high sensitivity troponin I (hsTnI) values and significant  changes across serial measurements may suggest ACS but many other  chronic and acute conditions are known to elevate hsTnI results.  Refer to the "Links" section for chest pain algorithms and additional  guidance. Performed at [facility], [address] [place], [place] [address]   D-dimer, quantitative (not at [facility])     Status: Abnormal   Collection Time: 09/09/20  4:38 PM  Result Value Ref Range   D-Dimer, Quant 0.93 (H) 0.00 - 0.50 ug/mL-FEU    Comment: (NOTE) At the manufacturer cut-off value of 0.5 g/mL FEU, this assay has a negative predictive value of 95-100%.This assay is intended for use in conjunction  with a clinical pretest probability (PTP) assessment model to exclude pulmonary embolism (PE) and deep venous thrombosis (DVT) in outpatients suspected of PE or DVT. Results should be correlated with clinical presentation. Performed at [facility], [place] [address], [place],  [address]   Brain natriuretic peptide     Status: Abnormal   Collection Time: 09/09/20  5:58 PM  Result Value Ref Range   B Natriuretic Peptide 356.9 (H)  0.0 - 100.0 pg/mL    Comment: Performed at [facility], [address] [place], [place] [address]  C-reactive protein     Status: Abnormal   Collection Time: 09/09/20  5:58 PM  Result Value Ref Range   CRP 1.7 (H) <1.0 mg/dL    Comment: Performed at [facility], [place] [address], [place], [place] [address]  Ferritin     Status: None   Collection Time: 09/09/20  5:58 PM  Result Value Ref Range   Ferritin 56 24 - 336 ng/mL    Comment: Performed at [facility], [address] [place], [place] [address]  Fibrinogen     Status: Abnormal   Collection Time: 09/09/20  5:58 PM  Result Value Ref Range   Fibrinogen 484 (H) 210 - 475 mg/dL    Comment: Performed at [facility], [address] [place], [place] [address]  Hepatitis B surface antigen     Status: None   Collection Time: 09/09/20  5:58 PM  Result Value Ref Range   Hepatitis B Surface Ag NON REACTIVE NON REACTIVE    Comment: Performed at [facility] [address] [place], [place] [address]  Lactate dehydrogenase     Status: None   Collection Time: 09/09/20  5:58 PM  Result Value Ref Range   LDH 174 98 - 192 U/L    Comment: Performed at [facility], [address] [place], [place] [address]  Procalcitonin     Status: None   Collection Time: 09/09/20  5:58 PM  Result Value Ref Range   Procalcitonin <0.10 ng/mL    Comment:        Interpretation: PCT (Procalcitonin) <= 0.5 ng/mL: Systemic infection (sepsis) is not likely. Local  bacterial infection is possible. (NOTE)       Sepsis PCT Algorithm           Lower Respiratory Tract                                      Infection PCT Algorithm    ----------------------------     ----------------------------         PCT < 0.25 ng/mL                PCT < 0.10 ng/mL          Strongly encourage             Strongly discourage   discontinuation of antibiotics    initiation of antibiotics    ----------------------------     -----------------------------       PCT 0.25 - 0.50 ng/mL            PCT 0.10 - 0.25 ng/mL               OR       >80% decrease in PCT            Discourage initiation of                                            antibiotics      Encourage discontinuation           of antibiotics    ----------------------------     -----------------------------  PCT >= 0.50 ng/mL              PCT 0.26 - 0.50 ng/mL               AND        <80% decrease in PCT             Encourage initiation of                                             antibiotics       Encourage continuation           of antibiotics    ----------------------------     -----------------------------        PCT >= 0.50 ng/mL                  PCT > 0.50 ng/mL               AND         increase in PCT                  Strongly encourage                                      initiation of antibiotics    Strongly encourage escalation           of antibiotics                                     -----------------------------                                           PCT <= 0.25 ng/mL                                                 OR                                        > 80% decrease in PCT                                      Discontinue / Do not initiate                                             antibiotics  Performed at [facility], [address] [place], [place] [address]   Triglycerides     Status: Abnormal   Collection Time: 09/09/20  5:58 PM  Result Value Ref Range    Triglycerides 157 (H) <150 mg/dL    Comment: Performed at [facility], [address] [place], [place] [address]  Culture, blood (Routine X 2)  w Reflex to ID Panel     Status: None (Preliminary result)   Collection Time: 09/09/20  5:58 PM   Specimen: BLOOD  Result Value Ref Range   Specimen Description BLOOD BLOOD LEFT HAND    Special Requests      BOTTLES DRAWN AEROBIC AND ANAEROBIC Blood Culture adequate volume   Culture      NO GROWTH < 12 HOURS Performed at [facility], [address], [place], [place] [address]    Report Status PENDING   Culture, blood (Routine X 2) w Reflex to ID Panel     Status: None (Preliminary result)   Collection Time: 09/09/20  5:58 PM   Specimen: BLOOD  Result Value Ref Range   Specimen Description BLOOD RIGHT ANTECUBITAL    Special Requests      BOTTLES DRAWN AEROBIC AND ANAEROBIC Blood Culture adequate volume   Culture  Setup Time      GRAM POSITIVE COCCI ANAEROBIC BOTTLE ONLY Organism ID to follow CRITICAL RESULT CALLED TO, READ BACK BY AND VERIFIED WITH: Performed at [facility], [address], [place], [place] [address]    Culture GRAM POSITIVE COCCI    Report Status PENDING   Troponin I (High Sensitivity)     Status: Abnormal   Collection Time: 09/09/20  5:58 PM  Result Value Ref Range   Troponin I (High Sensitivity) 264 (HH) <18 ng/L    Comment: CRITICAL VALUE NOTED. VALUE IS CONSISTENT WITH PREVIOUSLY REPORTED/CALLED VALUE DB (NOTE) Elevated high sensitivity troponin I (hsTnI) values and significant  changes across serial measurements may suggest ACS but many other  chronic and acute conditions are known to elevate hsTnI results.  Refer to the "Links" section for chest pain algorithms and additional  guidance. Performed at [facility], [address] [place], [place] [address]   Heparin level (unfractionated)     Status: Abnormal   Collection Time: 09/09/20  5:58 PM  Result Value  Ref Range   Heparin Unfractionated 1.29 (H) 0.30 - 0.70 IU/mL    Comment: Performed at [facility], [address] [place], [place] [address]  APTT     Status: None   Collection Time: 09/09/20  5:58 PM  Result Value Ref Range   aPTT 31 24 - 36 seconds    Comment: Performed at [facility], [address] [place], [place] [address]  D-dimer, quantitative (not at [facility])     Status: Abnormal   Collection Time: 09/09/20  5:58 PM  Result Value Ref Range   D-Dimer, Quant 0.94 (H) 0.00 - 0.50 ug/mL-FEU    Comment: (NOTE) At the manufacturer cut-off value of 0.5 g/mL FEU, this assay has a negative predictive value of 95-100%.This assay is intended for use in conjunction with a clinical pretest probability (PTP) assessment model to exclude pulmonary embolism (PE) and deep venous thrombosis (DVT) in outpatients suspected of PE or DVT. Results should be correlated with clinical presentation. Performed at [facility], [place] [address] [place], [place] [address]   CBG monitoring, ED     Status: Abnormal   Collection Time: 09/09/20  6:29 PM  Result Value Ref Range   Glucose-Capillary 157 (H) 70 - 99 mg/dL    Comment: Glucose reference range applies only to samples taken after fasting for at least 8 hours.  CBG monitoring, ED     Status: Abnormal   Collection Time: 09/09/20  9:39 PM  Result Value Ref Range   Glucose-Capillary 190 (  H) 70 - 99 mg/dL    Comment: Glucose reference range applies only to samples taken after fasting for at least 8 hours.  Troponin I (High Sensitivity)     Status: Abnormal   Collection Time: 09/10/20 12:35 AM  Result Value Ref Range   Troponin I (High Sensitivity) 250 (HH) <18 ng/L    Comment: CRITICAL VALUE NOTED. VALUE IS CONSISTENT WITH PREVIOUSLY REPORTED/CALLED VALUE [facility] (NOTE) Elevated high sensitivity troponin I (hsTnI) values and significant  changes across serial measurements may suggest ACS but many other  chronic and acute  conditions are known to elevate hsTnI results.  Refer to the "Links" section for chest pain algorithms and additional  guidance. Performed at [facility], [address] [place], [place] [address]   CBC with Differential/Platelet     Status: Abnormal   Collection Time: 09/10/20  4:31 AM  Result Value Ref Range   WBC 5.2 4.0 - 10.5 K/uL   RBC 3.87 (L) 4.22 - 5.81 MIL/uL   Hemoglobin 12.7 (L) 13.0 - 17.0 g/dL   HCT 37.4 (L) 39.0 - 52.0 %   MCV 96.6 80.0 - 100.0 fL   MCH 32.8 26.0 - 34.0 pg   MCHC 34.0 30.0 - 36.0 g/dL   RDW 13.2 11.5 - 15.5 %   Platelets 211 150 - 400 K/uL   nRBC 0.0 0.0 - 0.2 %   Neutrophils Relative % 39 %   Neutro Abs 2.0 1.7 - 7.7 K/uL   Lymphocytes Relative 44 %   Lymphs Abs 2.3 0.7 - 4.0 K/uL   Monocytes Relative 12 %   Monocytes Absolute 0.7 0.1 - 1.0 K/uL   Eosinophils Relative 4 %   Eosinophils Absolute 0.2 0.0 - 0.5 K/uL   Basophils Relative 1 %   Basophils Absolute 0.0 0.0 - 0.1 K/uL   Immature Granulocytes 0 %   Abs Immature Granulocytes 0.02 0.00 - 0.07 K/uL    Comment: Performed at [facility], [address] [place], [place] [address]  Comprehensive metabolic panel     Status: Abnormal   Collection Time: 09/10/20  4:31 AM  Result Value Ref Range   Sodium 141 135 - 145 mmol/L   Potassium 3.9 3.5 - 5.1 mmol/L    Comment: HEMOLYSIS AT THIS LEVEL MAY AFFECT RESULT   Chloride 109 98 - 111 mmol/L   CO2 25 22 - 32 mmol/L   Glucose, Bld 139 (H) 70 - 99 mg/dL    Comment: Glucose reference range applies only to samples taken after fasting for at least 8 hours.   BUN 21 8 - 23 mg/dL   Creatinine, Ser 0.99 0.61 - 1.24 mg/dL   Calcium 8.2 (L) 8.9 - 10.3 mg/dL   Total Protein 6.4 (L) 6.5 - 8.1 g/dL   Albumin 3.0 (L) 3.5 - 5.0 g/dL   AST 27 15 - 41 U/L   ALT 14 0 - 44 U/L   Alkaline Phosphatase 49 38 - 126 U/L   Total Bilirubin 1.1 0.3 - 1.2 mg/dL   GFR, Estimated >60 >60 mL/min    Comment: (NOTE) Calculated using the CKD-EPI  Creatinine Equation (2021)    Anion gap 7 5 - 15    Comment: Performed at [facility], [address] [place], [place] [address]  C-reactive protein     Status: Abnormal   Collection Time: 09/10/20  4:31 AM  Result Value Ref Range   CRP 1.0 (H) <1.0 mg/dL    Comment: Performed at  [facility], [place] [address] [place], [place] [address]  Ferritin     Status: None   Collection Time: 09/10/20  4:31 AM  Result Value Ref Range   Ferritin 55 24 - 336 ng/mL    Comment: Performed at [facility], [address] [place], [place] [address]  Magnesium     Status: None   Collection Time: 09/10/20  4:31 AM  Result Value Ref Range   Magnesium 1.8 1.7 - 2.4 mg/dL    Comment: Performed at [facility], [address] [place], [place] [address]  Hemoglobin A1c     Status: Abnormal   Collection Time: 09/10/20  4:31 AM  Result Value Ref Range   Hgb A1c MFr Bld 8.0 (H) 4.8 - 5.6 %    Comment: (NOTE) Pre diabetes:          5.7%-6.4%  Diabetes:              >6.4%  Glycemic control for   <7.0% adults with diabetes    Mean Plasma Glucose 182.9 mg/dL    Comment: Performed at [facility] [address] [place], [place] [address]  Heparin level (unfractionated)     Status: Abnormal   Collection Time: 09/10/20  4:31 AM  Result Value Ref Range   Heparin Unfractionated 1.32 (H) 0.30 - 0.70 IU/mL    Comment: (NOTE) If heparin results are below expected values, and patient dosage has  been confirmed, suggest follow up testing of antithrombin III levels. Performed at [facility], [address] [place], [place] [address]   APTT     Status: Abnormal   Collection Time: 09/10/20  4:31 AM  Result Value Ref Range   aPTT 126 (H) 24 - 36 seconds    Comment:        IF BASELINE aPTT IS ELEVATED, SUGGEST PATIENT RISK ASSESSMENT BE USED TO DETERMINE APPROPRIATE ANTICOAGULANT THERAPY. Performed at [facility], [address]  [place], [place] [address]   D-dimer, quantitative (not at [facility])     Status: Abnormal   Collection Time: 09/10/20  4:31 AM  Result Value Ref Range   D-Dimer, Quant 0.75 (H) 0.00 - 0.50 ug/mL-FEU    Comment: (NOTE) At the manufacturer cut-off value of 0.5 g/mL FEU, this assay has a negative predictive value of 95-100%.This assay is intended for use in conjunction with a clinical pretest probability (PTP) assessment model to exclude pulmonary embolism (PE) and deep venous thrombosis (DVT) in outpatients suspected of PE or DVT. Results should be correlated with clinical presentation. Performed at [facility], [place] [address] [place], [place] [address]   CBG monitoring, ED     Status: Abnormal   Collection Time: 09/10/20  8:59 AM  Result Value Ref Range   Glucose-Capillary 130 (H) 70 - 99 mg/dL    Comment: Glucose reference range applies only to samples taken after fasting for at least 8 hours.  CBG monitoring, ED     Status: Abnormal   Collection Time: 09/10/20 11:42 AM  Result Value Ref Range   Glucose-Capillary 146 (H) 70 - 99 mg/dL    Comment: Glucose reference range applies only to samples taken after fasting for at least 8 hours.  APTT     Status: Abnormal   Collection Time: 09/10/20  3:11 PM  Result Value Ref Range   aPTT 87 (H) 24 - 36 seconds    Comment:  IF BASELINE aPTT IS ELEVATED, SUGGEST PATIENT RISK ASSESSMENT BE USED TO DETERMINE APPROPRIATE ANTICOAGULANT THERAPY. Performed at [facility], [address] [place], [place] [address]   CBG monitoring, ED     Status: Abnormal   Collection Time: 09/10/20  4:04 PM  Result Value Ref Range   Glucose-Capillary 158 (H) 70 - 99 mg/dL    Comment: Glucose reference range applies only to samples taken after fasting for at least 8 hours.   DG Chest 2 View  Result Date: 09/09/2020 CLINICAL DATA:  SOB EXAM: CHEST - 2 VIEW COMPARISON:  12/20/2016 and prior. FINDINGS: No focal consolidation. No pneumothorax or  pleural effusion. Stable cardiomediastinal silhouette. Indwelling loop recorder. Multilevel spondylosis. IMPRESSION: No acute airspace disease. Electronically Signed   By: Primitivo Gauze M.D.   On: 09/09/2020 15:06   CT Head Wo Contrast  Result Date: 09/09/2020 CLINICAL DATA:  Syncope with fall.  Transient disorientation EXAM: CT HEAD WITHOUT CONTRAST TECHNIQUE: Contiguous axial images were obtained from the base of the skull through the vertex without intravenous contrast. COMPARISON:  Jan 09, 2020 FINDINGS: Brain: Age related volume loss is stable. There is no demonstrable intracranial mass, hemorrhage, extra-axial fluid collection, or midline shift. There is stable encephalomalacia along the inferior cerebellar vermis and adjacent inferior cerebellum bilaterally near the midline. Prior small infarct in the posteromedial mid right cerebellum stable. Prior small lacunar infarct in the anterior limb of the right external capsule is stable. Mild small vessel disease in the centra semiovale bilaterally is stable. No acute appearing infarct is demonstrable on this study. Vascular: No hyperdense vessel. Calcification in the distal left vertebral artery and carotid siphon regions bilaterally noted. Skull: Bony calvarium appears intact. Sinuses/Orbits: Mucosal thickening and opacification noted in several ethmoid air cells. Mucosal thickening noted in the right sphenoid sinus. Small retention cyst in the anterior left sphenoid sinus. Visualized orbits appear symmetric bilaterally. Other: Mastoid air cells are clear. IMPRESSION: Age related volume loss with slight periventricular small vessel disease, stable. Prior posterior midline cerebellar infarct. Small nearby right posteromedial lacunar infarct in the cerebellum. Prior small infarct in the anterior limb of the right external capsule. No evident acute infarct. No mass or hemorrhage. There are foci of arterial vascular calcification. There is paranasal sinus  disease at several sites. Electronically Signed   By: Lowella Grip III M.D.   On: 09/09/2020 12:10   CT Cervical Spine Wo Contrast  Result Date: 09/09/2020 CLINICAL DATA:  Syncope, fell EXAM: CT CERVICAL SPINE WITHOUT CONTRAST TECHNIQUE: Multidetector CT imaging of the cervical spine was performed without intravenous contrast. Multiplanar CT image reconstructions were also generated. COMPARISON:  None. FINDINGS: Alignment: No spondylolisthesis. Mild reversal of the normal cervical lordosis. Skull base and vertebrae: No fracture or focal bone lesion. Exuberant anterior endplate spurring at all cervical levels. Soft tissues and spinal canal: No prevertebral hematoma or soft tissue swelling. Coarse carotid bifurcation atheromatous calcifications. Disc levels: Posterior protrusions and associated endplate spurring K2-H0. Mild narrowing C3-4, moderate narrowing C4-C7 interspaces. Facet and uncovertebral DJD contribute to osseous foraminal stenosis most marked left C3-4, right C4-5, left greater than right C5-C7. Upper chest: Negative. Other: None IMPRESSION: 1. Negative for fracture or other acute bone abnormality. 2. Multilevel cervical spondylitic changes as above. 3. Loss of the normal cervical spine lordosis, which may be secondary to positioning, spasm, or soft tissue injury. 4. Bilateral carotid  calcifications. Electronically Signed   By: Lucrezia Europe M.D.   On: 09/09/2020 15:11    Pending  Labs [employer] (From admission, onward)          Start     Ordered   09/11/20 0500  C-reactive protein  Daily,   STAT      09/10/20 0733   09/11/20 0500  CBC with Differential/Platelet  Daily,   STAT      09/10/20 0733   09/11/20 0500  Comprehensive metabolic panel  Daily,   STAT      09/10/20 0733   09/11/20 0500  Ferritin  Daily,   STAT      09/10/20 0733   09/11/20 0500  Fibrin derivatives D-Dimer ([place] only)  Daily,   STAT      09/10/20 0733   09/11/20 0500  Magnesium  Daily,   STAT       09/10/20 0733   09/11/20 0500  Phosphorus  Daily,   STAT      09/10/20 0733   09/10/20 1043  Urine Culture  ONCE - STAT,   STAT       Comments: In and out catheterization    09/10/20 1044   09/09/20 1758  Blood Culture ID Panel (Reflexed)  Once,   STAT        09/09/20 1758   09/09/20 1738  Culture, sputum-assessment  Once,   STAT        09/09/20 1737          Vitals/Pain Today's Vitals   09/10/20 1500 09/10/20 1530 09/10/20 1600 09/10/20 1630  BP: (!) 156/115 131/71 (!) 150/63 119/80  Pulse: (!) 57 (!) 56 (!) 50 (!) 56  Resp: _0 Temp:      TempSrc:      SpO2: 98% 99% 99% 98%  Weight:      Height:      PainSc:        Isolation Precautions Airborne and Contact precautions  Medications Medications  ipratropium (ATROVENT HFA) inhaler 2 puff (2 puffs Inhalation Given 09/10/20 1510)  albuterol (VENTOLIN HFA) 108 (90 Base) MCG/ACT inhaler 2 puff (has no administration in time range)  dextromethorphan-guaiFENesin (MUCINEX DM) 30-600 MG per 12 hr tablet 1 tablet (has no administration in time range)  ascorbic acid (VITAMIN C) tablet 500 mg (500 mg Oral Given 09/10/20 1145)  zinc sulfate capsule 220 mg (220 mg Oral Given 09/10/20 1145)  ondansetron (ZOFRAN) injection 4 mg (has no administration in time range)  acetaminophen (TYLENOL) suppository 650 mg (has no administration in time range)  insulin aspart (novoLOG) injection 0-9 Units (0 Units Subcutaneous Patient Refused/Not Given 09/10/20 1605)  insulin aspart (novoLOG) injection 0-5 Units (0 Units Subcutaneous Not Given 09/09/20 2139)  aspirin EC tablet 81 mg (81 mg Oral Given 09/09/20 2140)  atorvastatin (LIPITOR) tablet 80 mg (80 mg Oral Given 09/10/20 1145)  insulin glargine (LANTUS) injection 50 Units (50 Units Subcutaneous Given 09/10/20 1144)  vitamin B-12 (CYANOCOBALAMIN) tablet 100 mcg (100 mcg Oral Given 09/10/20 1145)  latanoprost (XALATAN) 0.005 % ophthalmic solution 1 drop (1 drop Both Eyes Not Given 09/09/20 2323)   hydrALAZINE (APRESOLINE) injection 5 mg (has no administration in time range)  heparin ADULT infusion 100 units/mL (25000 units/282m) (1,200 Units/hr Intravenous Rate/Dose Change 09/10/20 0614)  tamsulosin (FLOMAX) capsule 0.4 mg (0.4 mg Oral Given 09/10/20 1145)  sodium chloride 0.9 % bolus 500 mL (0 mLs Intravenous Stopped 09/09/20 1752)  heparin bolus via infusion 4,000 Units (4,000 Units Intravenous Bolus from Bag 09/09/20 1946)    Mobility walks with person assist High fall risk  Focused Assessments Cardiac Assessment Handoff:    Lab Results  Component Value Date   TROPONINI <0.03 12/21/2016   Lab Results  Component Value Date   DDIMER 0.75 (H) 09/10/2020   Does the Patient currently have chest pain? No  , Pulmonary Assessment Handoff:  Lung sounds:   O2 Device: Room Air        R Recommendations: See Admitting Provider Note  Report given to:   Additional Notes:

## 2020-09-10 NOTE — ED Notes (Signed)
This RN to bedside, pt found to have accidentally pulled out IV to right hand, bleeding controlled with pressure.

## 2020-09-10 NOTE — ED Notes (Signed)
Lab contacted to come collect cultures

## 2020-09-10 NOTE — Progress Notes (Signed)
PHARMACY - PHYSICIAN COMMUNICATION CRITICAL VALUE ALERT - BLOOD CULTURE IDENTIFICATION (BCID)  Mathew Baker is an 80 y.o. male who presented to [facility] on 09/09/2020 with a chief complaint of syncope and SOB found to be Covid positive.  Assessment:  MRSE grown in anaerobic bottle from 1 of 4 blood cultures. There is possibility of contaminant with staph epi. Per discussion with MD, will reculture and order PCT for confirmation.  Clinically stable, will continue to monitor and follow-up PCT and cultures. No antibiotics at this time.  Name of physician (or Provider) Contacted: Dr. Carolyne Littles  Current antibiotics: Not currently on Antimicrobial agents  Changes to prescribed antibiotics recommended:  No antibiotics at this time. Pending repeat cultures and PCT to r/o contamination.  Results for orders placed or performed during the hospital encounter of 09/09/20  Blood Culture ID Panel (Reflexed) (Collected: 09/09/2020  5:58 PM)  Result Value Ref Range   Enterococcus faecalis NOT DETECTED NOT DETECTED   Enterococcus Faecium NOT DETECTED NOT DETECTED   Listeria monocytogenes NOT DETECTED NOT DETECTED   Staphylococcus species DETECTED (A) NOT DETECTED   Staphylococcus aureus (BCID) NOT DETECTED NOT DETECTED   Staphylococcus epidermidis DETECTED (A) NOT DETECTED   Staphylococcus lugdunensis NOT DETECTED NOT DETECTED   Streptococcus species NOT DETECTED NOT DETECTED   Streptococcus agalactiae NOT DETECTED NOT DETECTED   Streptococcus pneumoniae NOT DETECTED NOT DETECTED   Streptococcus pyogenes NOT DETECTED NOT DETECTED   A.calcoaceticus-baumannii NOT DETECTED NOT DETECTED   Bacteroides fragilis NOT DETECTED NOT DETECTED   Enterobacterales NOT DETECTED NOT DETECTED   Enterobacter cloacae complex NOT DETECTED NOT DETECTED   Escherichia coli NOT DETECTED NOT DETECTED   Klebsiella aerogenes NOT DETECTED NOT DETECTED   Klebsiella oxytoca NOT DETECTED NOT DETECTED   Klebsiella pneumoniae NOT  DETECTED NOT DETECTED   Proteus species NOT DETECTED NOT DETECTED   Salmonella species NOT DETECTED NOT DETECTED   Serratia marcescens NOT DETECTED NOT DETECTED   Haemophilus influenzae NOT DETECTED NOT DETECTED   Neisseria meningitidis NOT DETECTED NOT DETECTED   Pseudomonas aeruginosa NOT DETECTED NOT DETECTED   Stenotrophomonas maltophilia NOT DETECTED NOT DETECTED   Candida albicans NOT DETECTED NOT DETECTED   Candida auris NOT DETECTED NOT DETECTED   Candida glabrata NOT DETECTED NOT DETECTED   Candida krusei NOT DETECTED NOT DETECTED   Candida parapsilosis NOT DETECTED NOT DETECTED   Candida tropicalis NOT DETECTED NOT DETECTED   Cryptococcus neoformans/gattii NOT DETECTED NOT DETECTED   Methicillin resistance mecA/C DETECTED (A) NOT DETECTED    Sherlynn Carbon [place] 09/10/2020  5:51 PM

## 2020-09-10 NOTE — ED Notes (Signed)
Secretary notified to page admitting MD regarding lack of response from admitting MD

## 2020-09-10 NOTE — Progress Notes (Signed)
PROGRESS NOTE    Mathew Baker  [id number] DOB: [birth year] DOA: 09/09/2020 PCP: Kirk Ruths, MD     Brief Narrative:  80 y.o. WM PMHx HTN, HLD, DM type II with hyperglycemia, stroke, Atrial fibrillation on Eliquis, BPH,   Presents with syncope and shortness of breath.  Patient states that he has been having cough, shortness of breath for more than 2 weeks.  He has chest pressure, denies active chest pain.  No fever or chills.  He has nausea, no vomiting, diarrhea or abdominal pain.  No symptoms of UTI.  No rectal bleeding or dark stool.  Patient states that he had lightheadedness, and passed out in the bathroom. He did did strike his head.  No unilateral numbness or tingling see extremities.  No facial droop or slurred speech. Patient was found to have hypotension with blood pressure 89/41 in ed, which improved to 139/79 later one without treatment.   ED Course: pt was found to have positive Covid Ag test, WBC 6.6, troponin 212, BNP 356, pending urinalysis, electrolytes renal function okay, temperature normal, heart rate 72, RR 15, oxygen saturation 94% on room air, chest x-ray negative.  CT head is negative for acute intracranial abnormalities.  CT of C-spine is negative for acute issues.  Patient is placed on MedSurg bed for observation.   Subjective: Afebrile overnight A/O x4, patient states unable to urinate within the past 24 hours even though he has the urge. States last time he urinated had very foul smell.   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  Chest x-ray negative for infiltration.  Will not start remdesivir and will steroid.  Will check inflammatory markers. -vitamin C, zinc.  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  Heart rate is 72 -Switch Eliquis to IV heparin due to elevated troponin  Elevated troponin:  Troponin 212, no chest pain, possibly due to demand ischemia.  Dr. Nehemiah Massed of cardiology is consulted -Per cardiology; Continue serial ECG and enzymes to assess for possible rhythm disturbances causing weakness fatigue or syncope and/or possibility of myocardial infarction  Hypotension:  -Blood pressure 89/41, which improved to 139/79.  Etiology is not  clear.  Patient does not seem to have sepsis clinically.  Possibly due to dehydration and continuation of Zestoretic -IV fluid: 500 cc normal saline -Monitor blood pressure closely -Hold Zestoretic -Resolved  Syncope:  No focal neuro deficit on physical examination.  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  CT of C-spine is negative.  Possibly due to hypotension.  Patient is on  Eliquis, low suspicions for PE.  Will check D-dimer.  -PT; recommends home health PT -Treat hypotension  HLD (hyperlipidemia) -Lipitor   Acute urinary retention/UTI? -1/5 urine culture pending -Bladder scan, performed in and out catheter and send urine for culture. -Flomax 0.4 mg daily  Staph epidermidis bacteremia? -Only 1/4 bottles positive for MRSE -Currently patient not spiking fevers, negative leukocytosis, will reorder cultures. -Obtain procalcitonin -1/5 we will hold on starting antibiotics at this time, unless patient begins to exhibit signs of acute bacteremia   DVT prophylaxis: IV heparin Code Status: Full Family Communication:  Status is: Inpatient    Dispo: The patient is from: Home              Anticipated d/c is to: Home;               Anticipated d/c date is: 1/10              Patient currently stable      Consultants:  Dr. Nehemiah Massed of cardiology   Procedures/Significant Events:    I have personally reviewed and interpreted all radiology studies and my findings are as above.  VENTILATOR SETTINGS: Room air 1/5 SPO2 98%   Cultures   Antimicrobials: Anti-infectives (From admission, onward)   Start     Ordered Stop   09/10/20 1000  remdesivir 100 mg in sodium chloride 0.9 % 100 mL IVPB  Status:  Discontinued       "Followed by" Linked Group Details   09/09/20 1545 09/09/20 1913   09/09/20 1645  remdesivir 200 mg in sodium chloride 0.9% 250 mL IVPB  Status:  Discontinued       "Followed by" Linked Group Details   09/09/20 1545 09/09/20 1913       Devices    LINES / TUBES:      Continuous Infusions: . heparin 1,200 Units/hr (09/10/20 0614)     Objective: Vitals:   09/10/20 0500 09/10/20 0533 09/10/20 0600 09/10/20 0630  BP: 122/83 (!) 160/87 118/62 121/61  Pulse: (!) 54 (!) 53 (!) 56 (!) 55  Resp: 15 16 12 14   Temp:      TempSrc:      SpO2: 94% 100% 95% 98%  Weight:      Height:        Intake/Output Summary (Last 24  hours) at 09/10/2020 0725 Last data filed at 09/10/2020 0600 Gross per 24 hour  Intake 176.35 ml  Output --  Net 176.35 ml   Filed Weights   09/09/20 1839  Weight: 104.8 kg    Examination:  General: A/O x4, No acute respiratory distress Eyes: negative scleral hemorrhage, negative anisocoria, negative icterus ENT: Negative Runny nose, negative gingival bleeding, Neck:  Negative scars, masses, torticollis, lymphadenopathy, JVD Lungs: Clear to auscultation bilaterally without wheezes or crackles Cardiovascular: Regular rate and rhythm without murmur gallop or rub normal S1 and S2 Abdomen: negative abdominal pain, nondistended, positive soft, bowel sounds, no rebound, no ascites, no appreciable mass Extremities: No significant cyanosis, clubbing, or edema bilateral lower extremities Skin: Negative rashes, lesions, ulcers Psychiatric:  Negative depression, negative anxiety, negative fatigue, negative mania  Central nervous system:  Cranial nerves II through XII intact, tongue/uvula midline, all extremities muscle strength 5/5, sensation intact throughout, negative dysarthria, negative expressive aphasia, negative receptive aphasia.  .     Data Reviewed: Care during the described time interval was provided by me .  I have reviewed this patient's available data, including medical history, events of note, physical examination, and all test results as part of my evaluation.  CBC: Recent Labs  Lab 09/09/20 1144 09/10/20 0431  WBC 6.6 5.2  NEUTROABS  --  2.0  HGB 14.0 12.7*  HCT 44.5 37.4*  MCV 100.9* 96.6  PLT 191 211   Basic Metabolic Panel: Recent Labs  Lab 09/09/20 1144 09/10/20 0431  NA 137 141  K 5.1 3.9  CL 105 109  CO2 20* 25  GLUCOSE 130* 139*  BUN 16 21  CREATININE 1.06 0.99  CALCIUM 8.5* 8.2*  MG  --  1.8   GFR: Estimated Creatinine Clearance: 75.7 mL/min (by C-G formula based on SCr of 0.99 mg/dL). Liver Function Tests: Recent Labs  Lab 09/10/20 0431  AST  27  ALT 14  ALKPHOS 49  BILITOT 1.1  PROT 6.4*  ALBUMIN 3.0*   No results for input(s): LIPASE, AMYLASE in the last 168 hours. No results for input(s): AMMONIA in the last 168 hours. Coagulation Profile: No results for input(s): INR, PROTIME in the last 168 hours. Cardiac Enzymes: No results for input(s): CKTOTAL, CKMB, CKMBINDEX, TROPONINI in the last 168 hours. BNP (last 3 results) No results for input(s): PROBNP in the last 8760 hours. HbA1C: No results for input(s): HGBA1C in the last 72 hours. CBG: Recent Labs  Lab 09/09/20 1829 09/09/20 2139  GLUCAP 157* 190*   Lipid Profile: Recent Labs    09/09/20 1758  TRIG 157*   Thyroid Function Tests: No results for input(s): TSH, T4TOTAL, FREET4, T3FREE, THYROIDAB in the last 72 hours. Anemia Panel: Recent Labs    09/09/20 1758 09/10/20 0431  FERRITIN 56 55   Sepsis Labs: Recent Labs  Lab 09/09/20 1758  PROCALCITON <0.10    Recent Results (from the past 240 hour(s))  Culture, blood (Routine X 2) w Reflex to ID Panel     Status: None (Preliminary result)   Collection Time: 09/09/20  5:58 PM   Specimen: BLOOD  Result Value Ref Range Status   Specimen Description BLOOD BLOOD LEFT HAND  Final   Special Requests   Final    BOTTLES DRAWN AEROBIC AND ANAEROBIC Blood Culture adequate volume   Culture   Final    NO GROWTH < 12 HOURS Performed at [facility], [address] [place], [place] [address]    Report Status PENDING  Incomplete  Culture, blood (Routine X 2) w Reflex to ID Panel     Status: None (Preliminary result)   Collection Time: 09/09/20  5:58 PM   Specimen: BLOOD  Result Value Ref Range Status   Specimen Description BLOOD RIGHT ANTECUBITAL  Final   Special Requests   Final    BOTTLES DRAWN AEROBIC AND ANAEROBIC Blood Culture adequate volume   Culture   Final    NO GROWTH < 12 HOURS Performed at [facility], [address] [place], [place] [address]    Report Status PENDING   Incomplete         Radiology Studies: DG Chest 2 View  Result Date: 09/09/2020 CLINICAL DATA:  SOB EXAM: CHEST - 2 VIEW  COMPARISON:  12/20/2016 and prior. FINDINGS: No focal consolidation. No pneumothorax or pleural effusion. Stable cardiomediastinal silhouette. Indwelling loop recorder. Multilevel spondylosis. IMPRESSION: No acute airspace disease. Electronically Signed   By: Stana Bunting M.D.   On: 09/09/2020 15:06   CT Head Wo Contrast  Result Date: 09/09/2020 CLINICAL DATA:  Syncope with fall.  Transient disorientation EXAM: CT HEAD WITHOUT CONTRAST TECHNIQUE: Contiguous axial images were obtained from the base of the skull through the vertex without intravenous contrast. COMPARISON:  Jan 09, 2020 FINDINGS: Brain: Age related volume loss is stable. There is no demonstrable intracranial mass, hemorrhage, extra-axial fluid collection, or midline shift. There is stable encephalomalacia along the inferior cerebellar vermis and adjacent inferior cerebellum bilaterally near the midline. Prior small infarct in the posteromedial mid right cerebellum stable. Prior small lacunar infarct in the anterior limb of the right external capsule is stable. Mild small vessel disease in the centra semiovale bilaterally is stable. No acute appearing infarct is demonstrable on this study. Vascular: No hyperdense vessel. Calcification in the distal left vertebral artery and carotid siphon regions bilaterally noted. Skull: Bony calvarium appears intact. Sinuses/Orbits: Mucosal thickening and opacification noted in several ethmoid air cells. Mucosal thickening noted in the right sphenoid sinus. Small retention cyst in the anterior left sphenoid sinus. Visualized orbits appear symmetric bilaterally. Other: Mastoid air cells are clear. IMPRESSION: Age related volume loss with slight periventricular small vessel disease, stable. Prior posterior midline cerebellar infarct. Small nearby right posteromedial lacunar infarct  in the cerebellum. Prior small infarct in the anterior limb of the right external capsule. No evident acute infarct. No mass or hemorrhage. There are foci of arterial vascular calcification. There is paranasal sinus disease at several sites. Electronically Signed   By: Bretta Bang III M.D.   On: 09/09/2020 12:10   CT Cervical Spine Wo Contrast  Result Date: 09/09/2020 CLINICAL DATA:  Syncope, fell EXAM: CT CERVICAL SPINE WITHOUT CONTRAST TECHNIQUE: Multidetector CT imaging of the cervical spine was performed without intravenous contrast. Multiplanar CT image reconstructions were also generated. COMPARISON:  None. FINDINGS: Alignment: No spondylolisthesis. Mild reversal of the normal cervical lordosis. Skull base and vertebrae: No fracture or focal bone lesion. Exuberant anterior endplate spurring at all cervical levels. Soft tissues and spinal canal: No prevertebral hematoma or soft tissue swelling. Coarse carotid bifurcation atheromatous calcifications. Disc levels: Posterior protrusions and associated endplate spurring G6-Y4. Mild narrowing C3-4, moderate narrowing C4-C7 interspaces. Facet and uncovertebral DJD contribute to osseous foraminal stenosis most marked left C3-4, right C4-5, left greater than right C5-C7. Upper chest: Negative. Other: None IMPRESSION: 1. Negative for fracture or other acute bone abnormality. 2. Multilevel cervical spondylitic changes as above. 3. Loss of the normal cervical spine lordosis, which may be secondary to positioning, spasm, or soft tissue injury. 4. Bilateral carotid  calcifications. Electronically Signed   By: Corlis Leak M.D.   On: 09/09/2020 15:11        Scheduled Meds: . vitamin C  500 mg Oral Daily  . aspirin EC  81 mg Oral QHS  . atorvastatin  80 mg Oral Daily  . insulin aspart  0-5 Units Subcutaneous QHS  . insulin aspart  0-9 Units Subcutaneous TID WC  . insulin glargine  50 Units Subcutaneous BID  . ipratropium  2 puff Inhalation Q6H  .  latanoprost  1 drop Both Eyes QHS  . vitamin B-12  100 mcg Oral Daily  . zinc sulfate  220 mg Oral Daily   Continuous Infusions: . heparin  1,200 Units/hr (09/10/20 0614)     LOS: 0 days    Time spent:40 min    Cameryn Schum, Geraldo Docker, MD [facility] Pager [phone]  If 7PM-7AM, please contact night-coverage [email or web address] Password [email or web address] 09/10/2020, 7:25 AM

## 2020-09-10 NOTE — ED Notes (Signed)
Dr Joseph Art notified via secure chat that would be placing foley in pt d/t greater than 750 mL in bladder and did not want to drain bladder too quickly with in and out cath.   Still awaiting response from notifying provider at 1345 that greater than 750 in bladder

## 2020-09-10 NOTE — Progress Notes (Signed)
ANTICOAGULATION CONSULT NOTE -   Pharmacy Consult for Heparin  Indication: chest pain/ACS  No Known Allergies  Patient Measurements: Height: 6\' 1"  (185.4 cm) Weight: 104.8 kg (231 lb) IBW/kg (Calculated) : 79.9 Heparin Dosing Weight: 104.8 kg   Vital Signs: BP: 130/58 (01/05 1430) Pulse Rate: 57 (01/05 1430)  Labs: Recent Labs    09/09/20 1144 09/09/20 1523 09/09/20 1758 09/10/20 0035 09/10/20 0431 09/10/20 1511  HGB 14.0  --   --   --  12.7*  --   HCT 44.5  --   --   --  37.4*  --   PLT 191  --   --   --  211  --   APTT  --   --  31  --  126* 87*  HEPARINUNFRC  --   --  1.29*  --  1.32*  --   CREATININE 1.06  --   --   --  0.99  --   TROPONINIHS  --  212* 264* 250*  --   --    Estimated Creatinine Clearance: 75.7 mL/min (by C-G formula based on SCr of 0.99 mg/dL).  Medical History: Past Medical History:  Diagnosis Date  . Chronic airway obstruction (HCC)   . Degeneration of intervertebral disc of lumbar region   . Diabetes mellitus without complication (HCC)   . Essential hypertension, benign   . Hyperlipidemia   . Hypertension   . Osteoarthritis of lower extremity   . Pneumonia, organism unspecified(486)   . PSA elevation 07/16/2013   Normal  . Stroke (HCC)     Medications: NKDA Relevant IP meds: ASA 81mg  QD.  Assessment: Pharmacy consulted to dose heparin in this 80 year old male admitted with NSTEMI.   CrCl = 70.7>75.7 ml/min Pt was on Eliquis 5 mg PO BID PTA, last dose on 1/3 at unknown time.    Hgb = 14.0>12.7 ,  Platelets = 191 >211  Date Time HL/aPTT Comment 1/4 1758 aPTT = 31 HL = 1.29; by aPTT subthera 1/5 0431 aPTT 126  HL=1.32; by aPTT suprathera 1/5 1511 aPTT 87 Therapeutic x1; HL not correlating.   Goal of Therapy:  APTT :  66 - 102  Heparin level 0.3-0.7 units/ml Monitor platelets by anticoagulation protocol: Yes   Plan:  Therapeutic aPTT x1; will continue current rate 1200 units/hr and recheck aPTT in 8hrs with HL daily until  correlating. Will check HL on 1/6 with AM labs.  Will monitor CBC daily.   Corona Popovich 09/10/2020,4:04 PM

## 2020-09-10 NOTE — Progress Notes (Signed)
ANTICOAGULATION CONSULT NOTE -   Pharmacy Consult for Heparin  Indication: chest pain/ACS  No Known Allergies  Patient Measurements: Height: 6\' 1"  (185.4 cm) Weight: 104.8 kg (231 lb) IBW/kg (Calculated) : 79.9 Heparin Dosing Weight: 104.8 kg   Vital Signs: BP: 160/87 (01/05 0533) Pulse Rate: 53 (01/05 0533)  Labs: Recent Labs    09/09/20 1144 09/09/20 1523 09/09/20 1758 09/10/20 0035 09/10/20 0431  HGB 14.0  --   --   --  12.7*  HCT 44.5  --   --   --  37.4*  PLT 191  --   --   --  211  APTT  --   --  31  --  126*  HEPARINUNFRC  --   --  1.29*  --  1.32*  CREATININE 1.06  --   --   --  0.99  TROPONINIHS  --  212* 264* 250*  --    Estimated Creatinine Clearance: 75.7 mL/min (by C-G formula based on SCr of 0.99 mg/dL).  Medical History: Past Medical History:  Diagnosis Date  . Chronic airway obstruction (HCC)   . Degeneration of intervertebral disc of lumbar region   . Diabetes mellitus without complication (HCC)   . Essential hypertension, benign   . Hyperlipidemia   . Hypertension   . Osteoarthritis of lower extremity   . Pneumonia, organism unspecified(486)   . PSA elevation 07/16/2013   Normal  . Stroke [facility])     Medications:  (Not in a hospital admission)   Assessment: Pharmacy consulted to dose heparin in this 80 year old male admitted with NSTEMI.   CrCl = 70.7 ml/min Pt was on Eliquis 5 mg PO BID PTA, last dose on 1/3 at unknown time.    1/4 @ 1758:  APTT = 31,   HL = 1.29   1/4 @ 1144:  Hgb = 14.0 ,  Platelets = 191   Goal of Therapy:  APTT :  66 - 102  Heparin level 0.3-0.7 units/ml Monitor platelets by anticoagulation protocol: Yes   Plan:  Heparin 4000 units IV X 1 bolus followed by heparin 1400 units/hr.  Will order aPTT 8 hrs after start of drip.  Will use aPTT to guide dosing until HL and aPTT are therapeutic.  Will check HL on 1/5 with AM labs.  Will monitor CBC daily.    0105 0431 aPTT 126, SUPRAtherapeutic.  Will decrease  Heparin infusion to 1200 units/hr and recheck aPTT in 8 hours.  Monitor CBC.    96, Kavi Almquist A 09/10/2020,6:10 AM

## 2020-09-10 NOTE — Evaluation (Signed)
Occupational Therapy Evaluation Patient Details Name: Mathew Baker MRN: [id number] DOB: 04-08-1941 Today's Date: 09/10/2020    History of Present Illness 80 y.o. male with known hypertension hyperlipidemia diabetes COPD and peripheral vascular disease with carotid plaque by ultrasound who has had a bilateral cerebellar infarct in 2020.  The patient has had full work-up and appropriate medication management for further risk reduction in recurrent episodes including an echocardiogram showing normal LV systolic function with ejection fraction of 60% and no evidence of valvular heart disease.  Additionally the patient has had a Holter monitor showing preatrial contractions and preventricular contractions.  A Linq device was placed and there was a 16-minute episode of what appeared to be atrial fibrillation which could have been related to the patient's stroke.  Therefore anticoagulation was started at that time and the patient has done fairly well until recently when he had significant dizziness weakness fatigue and a little chest pressure.  When at his bathroom sink.  He lowered himself to the ground but did not strike his head with no evidence of significant major injury.  He did have seen at the emergency room with an EKG showing normal sinus rhythm with left axis deviation and incomplete right bundle branch block.  BNP was 301 chest x-ray was normal and troponin was 212.  Currently the patient feels well there is no evidence of significant worsening symptoms.  He has tested positive for Covid   Clinical Impression   Patient presenting with decreased I in self care, balance, functional mobility/transfers, endurance, and safety awareness. Pt on RA this session. Upon entering the room, pt's IV is out and pt is bleeding on self, bed, and floor but has made no moves to notify staff and continues to eat breakfast. When he noticed OT he then verbalized, "help" and holds up UE. RN notified.  Patient reports living  with wife, she also has covid at home, and is mod I with use of SPC PTA. Pt still drives and wife performs home management tasks and cooks.  Patient doffs soiled clothing with cuing and set up A to wash UB. Pt performed bed mobility with min guard and standing x 3 reps for therapist to change sheets and hygiene with pt needing min A for standing balance. Pt reports pain in L knee with RN notified. Pt is able to take several side steps without displaying posterior bias and reports pain in less "after awhile". Patient will benefit from acute OT to increase overall independence in the areas of ADLs, functional mobility, and safety awareness in order to safely discharge home with family.    Follow Up Recommendations  No OT follow up;Supervision/Assistance - 24 hour    Equipment Recommendations  None recommended by OT       Precautions / Restrictions Precautions Precautions: Fall      Mobility Bed Mobility Overal bed mobility: Needs Assistance Bed Mobility: Rolling;Supine to Sit;Sit to Supine Rolling: Supervision   Supine to sit: Min guard Sit to supine: Min guard   General bed mobility comments: min cuing for technique and safety awareness    Transfers Overall transfer level: Needs assistance Equipment used: 1 person hand held assist Transfers: Sit to/from [employer] Sit to Stand: Min assist Stand pivot transfers: Min assist       General transfer comment: Pt with initial posterior bias in standing secondary to reports L knee pain from pt. balance improved each time he stood.    Balance Overall balance assessment: Needs assistance Sitting-balance support:  Feet supported Sitting balance-Leahy Scale: Good     Standing balance support: During functional activity Standing balance-Leahy Scale: Fair Standing balance comment: posterior bias                           ADL either performed or assessed with clinical judgement   ADL Overall ADL's : Needs  assistance/impaired Eating/Feeding: Independent;Sitting   Grooming: Wash/dry hands;Wash/dry face;Oral care;Set up;Supervision/safety;Sitting   Upper Body Bathing: Set up;Sitting                             General ADL Comments: Pt doffing bloody soiled clothing with supervision and washing self with set up A to obtain needed items     Vision Patient Visual Report: No change from baseline              Pertinent Vitals/Pain Pain Assessment: Faces Faces Pain Scale: Hurts even more Pain Location: L knee with standing Pain Descriptors / Indicators: Discomfort Pain Intervention(s): Repositioned;Monitored during session;Limited activity within patient's tolerance;Other (comment) (RN notified)     Hand Dominance Left   Extremity/Trunk Assessment Upper Extremity Assessment Upper Extremity Assessment: Overall WFL for tasks assessed   Lower Extremity Assessment Lower Extremity Assessment: Defer to PT evaluation   Cervical / Trunk Assessment Cervical / Trunk Assessment: Normal   Communication Communication Communication: No difficulties   Cognition Arousal/Alertness: Awake/alert Behavior During Therapy: WFL for tasks assessed/performed Overall Cognitive Status: Impaired/Different from baseline Area of Impairment: Safety/judgement;Awareness                         Safety/Judgement: Decreased awareness of safety Awareness: Emergent   General Comments: OT arrived to room and pt's IV out and he is bleeding all over bed and himself. Pt very slow to react and had not called for assistance. Looks at therapist and just holds R UE up and states, " Help" while still eating breakfast.              Home Living Family/patient expects to be discharged to:: Private residence Living Arrangements: Spouse/significant other Available Help at Discharge: Family;Other (Comment);Available 24 hours/day (daughter lives next door)   Home Access: Level entry     Home  Layout: One level     Bathroom Shower/Tub: Walk-in Hydrologist: Standard     Home Equipment: Shower seat - built in;Bedside commode;Walker - 2 wheels;Cane - single point;Toilet riser          Prior Functioning/Environment Level of Independence: Independent with assistive device(s)        Comments: Pt reports he still drives, mod I with self care, uses SPC within the home and community unless he is having a "bad day" and then will use RW        OT Problem List: Decreased strength;Decreased knowledge of use of DME or AE;Decreased activity tolerance;Decreased cognition;Pain;Impaired balance (sitting and/or standing);Decreased safety awareness      OT Treatment/Interventions: Self-care/ADL training;Therapeutic exercise;Patient/family education;Balance training;Energy conservation;Therapeutic activities;DME and/or AE instruction    OT Goals(Current goals can be found in the care plan section) Acute Rehab OT Goals Patient Stated Goal: to go home OT Goal Formulation: With patient Time For Goal Achievement: 09/24/20 Potential to Achieve Goals: Good ADL Goals Pt Will Perform Grooming: with modified independence;standing Pt Will Transfer to Toilet: with modified independence;ambulating Pt Will Perform Toileting - Clothing Manipulation and hygiene: with modified  independence;sit to/from stand Pt/caregiver will Perform Home Exercise Program: Increased strength;Both right and left upper extremity;With written HEP provided;With theraband  OT Frequency: Min 2X/week   Barriers to D/C:    none known at this time          AM-PAC OT "6 Clicks" Daily Activity     Outcome Measure Help from another person eating meals?: None Help from another person taking care of personal grooming?: A Little Help from another person toileting, which includes using toliet, bedpan, or urinal?: A Little Help from another person bathing (including washing, rinsing, drying)?: A  Little Help from another person to put on and taking off regular upper body clothing?: None Help from another person to put on and taking off regular lower body clothing?: A Little 6 Click Score: 20   End of Session Nurse Communication: Mobility status;Other (comment) (IV out and pt reporting pain in L knee)  Activity Tolerance: Patient tolerated treatment well Patient left: in bed;with call bell/phone within reach  OT Visit Diagnosis: Unsteadiness on feet (R26.81);Muscle weakness (generalized) (M62.81);History of falling (Z91.81)                Time: 0935-1005 OT Time Calculation (min): 30 min Charges:  OT General Charges $OT Visit: 1 Visit OT Evaluation $OT Eval Low Complexity: 1 Low OT Treatments $Self Care/Home Management : 23-37 mins  Darleen Crocker, MS, OTR/L , CBIS ascom [phone]  09/10/20, 11:13 AM

## 2020-09-10 NOTE — Evaluation (Signed)
Physical Therapy Evaluation Patient Details Name: Costa Jha MRN: [id number] DOB: 1940/10/03 Today's Date: 09/10/2020   History of Present Illness  80 y.o. male with medical history significant of hypertension, hyperlipidemia, diabetes mellitus, stroke, atrial fibrillation on Eliquis, BPH, who presents with syncope and shortness of breath.     Patient states that he has been having cough, shortness of breath for more than 2 weeks.  Tested + for Covid in ED.  Clinical Impression  Pt was eager and willing to participate with PT, showed good effort and relative confidence with standing/mobility.  He does have acute posterior-lateral L knee pain that does cause some discomfort and increased UE reliance with WBing tasks.  He did not have any LOBs with activity but during static standing with eyes closed had multiple LOBs backward after just a few seconds.  Pt has ADs at home and agrees that he needs to be using them consistently at least initially - interested in HHPT per progress.    Follow Up Recommendations Home health PT (per progress)    Equipment Recommendations  None recommended by PT    Recommendations for Other Services       Precautions / Restrictions Precautions Precautions: Fall Restrictions Weight Bearing Restrictions: No      Mobility  Bed Mobility Overal bed mobility: Needs Assistance Bed Mobility: Supine to Sit;Sit to Supine Rolling: Supervision   Supine to sit: Supervision Sit to supine: Supervision   General bed mobility comments: Pt able to get to sitting confidently and w/o hesitation.    Transfers Overall transfer level: Needs assistance Equipment used: Rolling walker (2 wheeled) Transfers: Sit to/from Stand Sit to Stand: Min assist Stand pivot transfers: Min assist       General transfer comment: Pt was able to stand with confidence, clearly using walker to take some pressure off L LE  Ambulation/Gait Ambulation/Gait assistance: Min guard Gait  Distance (Feet): 50 Feet Assistive device: Rolling walker (2 wheeled)       General Gait Details: Pt with slow, guarded but overall safe in-room ambulation.  He reports tolerable L posterior-lateral knee pain with the effort.  Stairs            Wheelchair Mobility    Modified Rankin (Stroke Patients Only)       Balance Overall balance assessment: Needs assistance Sitting-balance support: Feet supported Sitting balance-Leahy Scale: Good     Standing balance support: During functional activity Standing balance-Leahy Scale: Fair Standing balance comment: posterior bias                             Pertinent Vitals/Pain Pain Assessment: 0-10 Pain Score: 4  Faces Pain Scale: Hurts even more Pain Location: L lateral hamstring tendon with no known injury, trauma, etc (- Homan's sign) Pain Descriptors / Indicators: Discomfort Pain Intervention(s): Repositioned;Monitored during session;Limited activity within patient's tolerance;Other (comment) (RN notified)    Home Living Family/patient expects to be discharged to:: Private residence Living Arrangements: Spouse/significant other Available Help at Discharge: Family;Other (Comment);Available 24 hours/day Type of Home: House Home Access: Level entry     Home Layout: One level Home Equipment: Shower seat - built in;Bedside commode;Walker - 2 wheels;Cane - single point;Toilet riser;Walker - 4 wheels      Prior Function Level of Independence: Independent with assistive device(s)         Comments: Pt reports he still drives, mod I with self care, uses SPC within the home and community unless  he is having a "bad day" and then will use RW     Hand Dominance   Dominant Hand: Left    Extremity/Trunk Assessment   Upper Extremity Assessment Upper Extremity Assessment: Overall WFL for tasks assessed    Lower Extremity Assessment Lower Extremity Assessment: Overall WFL for tasks assessed    Cervical /  Trunk Assessment Cervical / Trunk Assessment: Normal  Communication   Communication: No difficulties  Cognition Arousal/Alertness: Awake/alert Behavior During Therapy: WFL for tasks assessed/performed Overall Cognitive Status: Impaired/Different from baseline Area of Impairment: Safety/judgement;Awareness                         Safety/Judgement: Decreased awareness of safety Awareness: Emergent   General Comments: OT arrived to room and pt's IV out and he is bleeding all over bed and himself. Pt very slow to react and had not called for assistance. Looks at therapist and just holds R UE up and states, " Help" while still eating breakfast.      General Comments      Exercises     Assessment/Plan    PT Assessment Patient needs continued PT services  PT Problem List Decreased activity tolerance;Decreased balance;Decreased safety awareness;Decreased knowledge of use of DME       PT Treatment Interventions Gait training;DME instruction;Functional mobility training;Therapeutic activities;Therapeutic exercise;Balance training;Neuromuscular re-education;Patient/family education    PT Goals (Current goals can be found in the Care Plan section)  Acute Rehab PT Goals Patient Stated Goal: to go home PT Goal Formulation: With patient Time For Goal Achievement: 09/24/20 Potential to Achieve Goals: Good    Frequency Min 2X/week   Barriers to discharge        Co-evaluation               AM-PAC PT "6 Clicks" Mobility  Outcome Measure Help needed turning from your back to your side while in a flat bed without using bedrails?: None Help needed moving from lying on your back to sitting on the side of a flat bed without using bedrails?: None Help needed moving to and from a bed to a chair (including a wheelchair)?: A Little Help needed standing up from a chair using your arms (e.g., wheelchair or bedside chair)?: A Little Help needed to walk in hospital room?: A  Little Help needed climbing 3-5 steps with a railing? : A Little 6 Click Score: 20    End of Session   Activity Tolerance: Patient tolerated treatment well;Patient limited by fatigue Patient left: in bed;with call bell/phone within reach Nurse Communication: Mobility status PT Visit Diagnosis: Muscle weakness (generalized) (M62.81);Difficulty in walking, not elsewhere classified (R26.2)    Time: [id number] PT Time Calculation (min) (ACUTE ONLY): 26 min   Charges:   PT Evaluation $PT Eval Low Complexity: 1 Low PT Treatments $Therapeutic Activity: 8-22 mins        Kreg Shropshire, DPT 09/10/2020, 2:10 PM

## 2020-09-10 NOTE — ED Notes (Signed)
Verbal order for foley cath from Dr Joseph Art via secure chat

## 2020-09-11 ENCOUNTER — Encounter: Payer: Self-pay | Admitting: Internal Medicine

## 2020-09-11 DIAGNOSIS — R778 Other specified abnormalities of plasma proteins: Secondary | ICD-10-CM

## 2020-09-11 DIAGNOSIS — I482 Chronic atrial fibrillation, unspecified: Secondary | ICD-10-CM | POA: Diagnosis not present

## 2020-09-11 DIAGNOSIS — E7800 Pure hypercholesterolemia, unspecified: Secondary | ICD-10-CM | POA: Diagnosis not present

## 2020-09-11 DIAGNOSIS — I10 Essential (primary) hypertension: Secondary | ICD-10-CM

## 2020-09-11 DIAGNOSIS — U071 COVID-19: Secondary | ICD-10-CM | POA: Diagnosis not present

## 2020-09-11 LAB — COMPREHENSIVE METABOLIC PANEL
ALT: 14 U/L (ref 0–44)
AST: 21 U/L (ref 15–41)
Albumin: 3 g/dL — ABNORMAL LOW (ref 3.5–5.0)
Alkaline Phosphatase: 43 U/L (ref 38–126)
Anion gap: 9 (ref 5–15)
BUN: 19 mg/dL (ref 8–23)
CO2: 25 mmol/L (ref 22–32)
Calcium: 8.3 mg/dL — ABNORMAL LOW (ref 8.9–10.3)
Chloride: 105 mmol/L (ref 98–111)
Creatinine, Ser: 0.68 mg/dL (ref 0.61–1.24)
GFR, Estimated: >60 mL/min (ref >60)
Glucose, Bld: 119 mg/dL — ABNORMAL HIGH (ref 70–99)
Potassium: 3.8 mmol/L (ref 3.5–5.1)
Sodium: 139 mmol/L (ref 135–145)
Total Bilirubin: 1.1 mg/dL (ref 0.3–1.2)
Total Protein: 6.3 g/dL — ABNORMAL LOW (ref 6.5–8.1)

## 2020-09-11 LAB — FERRITIN: Ferritin: 70 ng/mL (ref 24–336)

## 2020-09-11 LAB — CBC WITH DIFFERENTIAL/PLATELET
Abs Immature Granulocytes: 0.03 10*3/uL (ref 0.00–0.07)
Basophils Absolute: 0 10*3/uL (ref 0.0–0.1)
Basophils Relative: 0 %
Eosinophils Absolute: 0.2 10*3/uL (ref 0.0–0.5)
Eosinophils Relative: 3 %
HCT: 36.8 % — ABNORMAL LOW (ref 39.0–52.0)
Hemoglobin: 12 g/dL — ABNORMAL LOW (ref 13.0–17.0)
Immature Granulocytes: 1 %
Lymphocytes Relative: 31 %
Lymphs Abs: 1.9 10*3/uL (ref 0.7–4.0)
MCH: 31.7 pg (ref 26.0–34.0)
MCHC: 32.6 g/dL (ref 30.0–36.0)
MCV: 97.1 fL (ref 80.0–100.0)
Monocytes Absolute: 0.7 10*3/uL (ref 0.1–1.0)
Monocytes Relative: 11 %
Neutro Abs: 3.4 10*3/uL (ref 1.7–7.7)
Neutrophils Relative %: 54 %
Platelets: 226 10*3/uL (ref 150–400)
RBC: 3.79 MIL/uL — ABNORMAL LOW (ref 4.22–5.81)
RDW: 13.3 % (ref 11.5–15.5)
WBC: 6.2 10*3/uL (ref 4.0–10.5)
nRBC: 0 % (ref 0.0–0.2)

## 2020-09-11 LAB — GLUCOSE, CAPILLARY
Glucose-Capillary: 103 mg/dL — ABNORMAL HIGH (ref 70–99)
Glucose-Capillary: 138 mg/dL — ABNORMAL HIGH (ref 70–99)
Glucose-Capillary: 141 mg/dL — ABNORMAL HIGH (ref 70–99)
Glucose-Capillary: 124 mg/dL — ABNORMAL HIGH (ref 70–99)

## 2020-09-11 LAB — APTT
aPTT: 83 seconds — ABNORMAL HIGH (ref 24–36)
aPTT: 99 seconds — ABNORMAL HIGH (ref 24–36)

## 2020-09-11 LAB — D-DIMER, QUANTITATIVE: D-Dimer, Quant: 0.3 ug/mL-FEU (ref 0.00–0.50)

## 2020-09-11 LAB — HEPARIN LEVEL (UNFRACTIONATED): Heparin Unfractionated: 0.6 IU/mL (ref 0.30–0.70)

## 2020-09-11 LAB — PROCALCITONIN
Procalcitonin: <0.1 ng/mL
Procalcitonin: <0.1 ng/mL

## 2020-09-11 LAB — PHOSPHORUS: Phosphorus: 2.9 mg/dL (ref 2.5–4.6)

## 2020-09-11 LAB — C-REACTIVE PROTEIN: CRP: 1.1 mg/dL — ABNORMAL HIGH (ref <1.0)

## 2020-09-11 LAB — MAGNESIUM: Magnesium: 1.9 mg/dL (ref 1.7–2.4)

## 2020-09-11 MED ORDER — VANCOMYCIN HCL IN DEXTROSE 1-5 GM/200ML-% IV SOLN
1000.0000 mg | Freq: Two times a day (BID) | INTRAVENOUS | Status: DC
  Administered 2020-09-12 – 2020-09-14 (×5): 1000 mg via INTRAVENOUS
  Filled 2020-09-11 (×8): qty 200

## 2020-09-11 MED ORDER — APIXABAN 5 MG PO TABS
5.0000 mg | ORAL_TABLET | Freq: Two times a day (BID) | ORAL | Status: DC
  Administered 2020-09-11 – 2020-09-13 (×5): 5 mg via ORAL
  Filled 2020-09-11 (×5): qty 1

## 2020-09-11 MED ORDER — CHLORHEXIDINE GLUCONATE CLOTH 2 % EX PADS
6.0000 | MEDICATED_PAD | Freq: Every day | CUTANEOUS | Status: DC
  Administered 2020-09-11 – 2020-09-16 (×6): 6 via TOPICAL

## 2020-09-11 MED ORDER — VANCOMYCIN HCL 2000 MG/400ML IV SOLN
2000.0000 mg | Freq: Once | INTRAVENOUS | Status: AC
  Administered 2020-09-11: 2000 mg via INTRAVENOUS
  Filled 2020-09-11: qty 400

## 2020-09-11 NOTE — Progress Notes (Signed)
[facility] Cardiology [facility] Encounter Note  Patient: Mathew Baker / Admit Date: 09/09/2020 / Date of Encounter: 09/11/2020, 7:10 AM   Subjective: 80 year old male with known diabetes chronic obstructive pulmonary disease hypertension hyperlipidemia and cerebellar infarction in 2020 possibly consistent with atrial fibrillation for which the patient was placed on anticoagulation.  Patient was admitted for hypotension dizziness weakness cough congestion most consistent with Covid infection and has had no evidence of congestive heart failure and/or anginal equivalent and/or acute coronary syndrome.  Troponin levels have been 212/264/250 consistent with demand ischemia from Covid infection and illness rather than acute coronary syndrome.  Previous echocardiogram has shown normal LV systolic function with ejection fraction of 60% with no evidence of significant valvular heart disease.  Patient has had continued maintenance of normal sinus rhythm with an EKG showing normal sinus rhythm with left axis deviation and incomplete right bundle branch block.  There has been no other indication for further intervention from the cardiovascular standpoint  Review of Systems: Positive for: Shortness of breath Negative for: Vision change, hearing change, syncope, dizziness, nausea, vomiting,diarrhea, bloody stool, stomach pain, cough, congestion, diaphoresis, urinary frequency, urinary pain,skin lesions, skin rashes Others previously listed  Objective: Telemetry: Normal sinus rhythm Physical Exam: Blood pressure 133/74, pulse (!) 58, temperature 97.9 F (36.6 C), temperature source Oral, resp. rate 17, height 6\' 1"  (1.854 m), weight 104.8 kg, SpO2 97 %. Body mass index is 30.48 kg/m.    Intake/Output Summary (Last 24 hours) at 09/11/2020 0710 Last data filed at 09/11/2020 0450 Gross per 24 hour  Intake --  Output 1725 ml  Net -1725 ml    Inpatient Medications:  . vitamin C  500 mg Oral Daily  . aspirin  EC  81 mg Oral QHS  . atorvastatin  80 mg Oral Daily  . insulin aspart  0-5 Units Subcutaneous QHS  . insulin aspart  0-9 Units Subcutaneous TID WC  . insulin glargine  50 Units Subcutaneous BID  . ipratropium  2 puff Inhalation Q6H  . latanoprost  1 drop Both Eyes QHS  . tamsulosin  0.4 mg Oral QPC breakfast  . vitamin B-12  100 mcg Oral Daily  . zinc sulfate  220 mg Oral Daily   Infusions:  . heparin 1,400 Units/hr (09/11/20 0112)    Labs: Recent Labs    09/09/20 1144 09/10/20 0431  NA 137 141  K 5.1 3.9  CL 105 109  CO2 20* 25  GLUCOSE 130* 139*  BUN 16 21  CREATININE 1.06 0.99  CALCIUM 8.5* 8.2*  MG  --  1.8   Recent Labs    09/10/20 0431  AST 27  ALT 14  ALKPHOS 49  BILITOT 1.1  PROT 6.4*  ALBUMIN 3.0*   Recent Labs    09/09/20 1144 09/10/20 0431  WBC 6.6 5.2  NEUTROABS  --  2.0  HGB 14.0 12.7*  HCT 44.5 37.4*  MCV 100.9* 96.6  PLT 191 211   No results for input(s): CKTOTAL, CKMB, TROPONINI in the last 72 hours. Invalid input(s): POCBNP Recent Labs    09/10/20 0431  HGBA1C 8.0*     Weights: Filed Weights   09/09/20 1839  Weight: 104.8 kg     Radiology/Studies:  DG Chest 2 View  Result Date: 09/09/2020 CLINICAL DATA:  SOB EXAM: CHEST - 2 VIEW COMPARISON:  12/20/2016 and prior. FINDINGS: No focal consolidation. No pneumothorax or pleural effusion. Stable cardiomediastinal silhouette. Indwelling loop recorder. Multilevel spondylosis. IMPRESSION: No acute airspace disease. Electronically Signed  By: Primitivo Gauze M.D.   On: 09/09/2020 15:06   CT Head Wo Contrast  Result Date: 09/09/2020 CLINICAL DATA:  Syncope with fall.  Transient disorientation EXAM: CT HEAD WITHOUT CONTRAST TECHNIQUE: Contiguous axial images were obtained from the base of the skull through the vertex without intravenous contrast. COMPARISON:  Jan 09, 2020 FINDINGS: Brain: Age related volume loss is stable. There is no demonstrable intracranial mass, hemorrhage,  extra-axial fluid collection, or midline shift. There is stable encephalomalacia along the inferior cerebellar vermis and adjacent inferior cerebellum bilaterally near the midline. Prior small infarct in the posteromedial mid right cerebellum stable. Prior small lacunar infarct in the anterior limb of the right external capsule is stable. Mild small vessel disease in the centra semiovale bilaterally is stable. No acute appearing infarct is demonstrable on this study. Vascular: No hyperdense vessel. Calcification in the distal left vertebral artery and carotid siphon regions bilaterally noted. Skull: Bony calvarium appears intact. Sinuses/Orbits: Mucosal thickening and opacification noted in several ethmoid air cells. Mucosal thickening noted in the right sphenoid sinus. Small retention cyst in the anterior left sphenoid sinus. Visualized orbits appear symmetric bilaterally. Other: Mastoid air cells are clear. IMPRESSION: Age related volume loss with slight periventricular small vessel disease, stable. Prior posterior midline cerebellar infarct. Small nearby right posteromedial lacunar infarct in the cerebellum. Prior small infarct in the anterior limb of the right external capsule. No evident acute infarct. No mass or hemorrhage. There are foci of arterial vascular calcification. There is paranasal sinus disease at several sites. Electronically Signed   By: Lowella Grip III M.D.   On: 09/09/2020 12:10   CT Cervical Spine Wo Contrast  Result Date: 09/09/2020 CLINICAL DATA:  Syncope, fell EXAM: CT CERVICAL SPINE WITHOUT CONTRAST TECHNIQUE: Multidetector CT imaging of the cervical spine was performed without intravenous contrast. Multiplanar CT image reconstructions were also generated. COMPARISON:  None. FINDINGS: Alignment: No spondylolisthesis. Mild reversal of the normal cervical lordosis. Skull base and vertebrae: No fracture or focal bone lesion. Exuberant anterior endplate spurring at all cervical  levels. Soft tissues and spinal canal: No prevertebral hematoma or soft tissue swelling. Coarse carotid bifurcation atheromatous calcifications. Disc levels: Posterior protrusions and associated endplate spurring [id number]. Mild narrowing C3-4, moderate narrowing C4-C7 interspaces. Facet and uncovertebral DJD contribute to osseous foraminal stenosis most marked left C3-4, right C4-5, left greater than right C5-C7. Upper chest: Negative. Other: None IMPRESSION: 1. Negative for fracture or other acute bone abnormality. 2. Multilevel cervical spondylitic changes as above. 3. Loss of the normal cervical spine lordosis, which may be secondary to positioning, spasm, or soft tissue injury. 4. Bilateral carotid  calcifications. Electronically Signed   By: Lucrezia Europe M.D.   On: 09/09/2020 15:11     Assessment and Recommendation  80 y.o. male with diabetes COPD hypertension hyperlipidemia peripheral vascular disease with cerebrovascular accident of the cerebellum possibly due to paroxysmal nonvalvular atrial fibrillation now remaining in normal rhythm with Covid infection likely causing hypotension and illness without evidence of current acute coronary syndrome angina and/or congestive heart failure 1.  Continue treatment and supportive care of Covid infection which is likely the primary cause of all current concerns and issues at this time 2.  No further cardiac diagnostics necessary at this time due to no evidence of angina acute coronary syndrome or congestive heart failure with troponin levels most consistent with demand ischemia and illness 3.  Paroxysmal nonvalvular atrial fibrillation remaining in normal sinus rhythm at this time and on heparin but will  reinstate Eliquis as per medicine when appropriate to 5 mg Eliquis twice per day for further risk reduction stroke with atrial fibrillation in the future 4.  High intensity cholesterol therapy for peripheral vascular disease as before with Lipitor at 80 mg without  change 5.  No additional medication management for blood pressure due to hypotension when admitted and concerns of this being caused by illness and Covid infection 6.  Progressed to rehabilitation without restriction 7.  Please call if further questions from the cardiovascular standpoint but otherwise will sign off at this time.  Signed, Serafina Royals M.D. FACC

## 2020-09-11 NOTE — TOC Initial Note (Signed)
Transition of Care [facility]) - Initial/Assessment Note    Patient Details  Name: Mathew Baker MRN: [id number] Date of Birth: 19-Dec-1940  [facility] CM/SW Contact:    Candie Chroman, LCSW Phone Number: 09/11/2020, 11:59 AM  Clinical Narrative:  Patient on COVID isolation precautions. CSW called patient in room, introduced role, and explained that PT recommendations would be discussed. Patient is not interested in home health PT at this time. Encouraged him to call his PCP if he changes his mind once he returns home. No DME recommendations. No further concerns. CSW encouraged patient to contact CSW as needed. CSW will continue to follow patient for support and facilitate return home when stable.                Expected Discharge Plan: Home/Self Care Barriers to Discharge: Continued Medical Work up   Patient Goals and CMS Choice        Expected Discharge Plan and Services Expected Discharge Plan: Home/Self Care     Post Acute Care Choice: NA Living arrangements for the past 2 months: Single Family Home                                      Prior Living Arrangements/Services Living arrangements for the past 2 months: Single Family Home Lives with:: Spouse Patient language and need for interpreter reviewed:: Yes Do you feel safe going back to the place where you live?: Yes      Need for Family Participation in Patient Care: Yes (Comment) Care giver support system in place?: Yes (comment)   Criminal Activity/Legal Involvement Pertinent to Current Situation/Hospitalization: No - Comment as needed  Activities of Daily Living Home Assistive Devices/Equipment: Walker (specify type) ADL Screening (condition at time of admission) Patient's cognitive ability adequate to safely complete daily activities?: Yes Is the patient deaf or have difficulty hearing?: No Does the patient have difficulty seeing, even when wearing glasses/contacts?: No Does the patient have  difficulty concentrating, remembering, or making decisions?: No Patient able to express need for assistance with ADLs?: Yes Does the patient have difficulty dressing or bathing?: No Independently performs ADLs?: Yes (appropriate for developmental age) Does the patient have difficulty walking or climbing stairs?: Yes Weakness of Legs: Left Weakness of Arms/Hands: None  Permission Sought/Granted                  Emotional Assessment Appearance:: Appears stated age Attitude/Demeanor/Rapport: Engaged Affect (typically observed): Appropriate,Calm,Pleasant Orientation: : Oriented to Self,Oriented to Place,Oriented to  Time,Oriented to Situation Alcohol / Substance Use: Not Applicable Psych Involvement: No (comment)  Admission diagnosis:  Syncope and collapse [R55] Syncope, unspecified syncope type [R55] COVID-19 virus infection [U07.1] COVID-19 [U07.1] Patient Active Problem List   Diagnosis Date Noted  . COVID-19 virus infection 09/09/2020  . Syncope 09/09/2020  . Elevated troponin 09/09/2020  . Atrial fibrillation, chronic ([place]) 09/09/2020  . Hypotension 09/09/2020  . Bradycardia 03/19/2019  . Chest pain 12/20/2016  . HTN (hypertension) 12/20/2016  . HLD (hyperlipidemia) 12/20/2016  . Syncope and collapse 12/20/2016  . 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 [place] [address]  Phone: [phone] Fax: [phone]  [facility] [address] [place] - [address] [place] [address] Phone: [phone] Fax: [phone]     Social Determinants of Health (SDOH) Interventions    Readmission Risk Interventions No  flowsheet data found.

## 2020-09-11 NOTE — Progress Notes (Signed)
PROGRESS NOTE    Cotton Beckley  [id number] DOB: 1941/06/22 DOA: 09/09/2020 PCP: Lauro Regulus, MD    Brief Narrative:  Mr. Inniss was admitted to the hospital with a working diagnosis of SARS COVID-19 viral infection.  80 year old male past medical history for hypertension, dyslipidemia, type 2 diabetes mellitus, atrial fibrillation and history of CVA who presented with dyspnea and syncope.  Patient reported cough, dyspnea for about 2 weeks.  Positive lightheadedness and syncope in the bathroom.  He was transported to the ED where his blood pressure was 89/41 with IV fluids improved to 139/79, heart rate 64, respiratory rate of 14, oxygen saturation 96%, his lungs were clear to auscultation, no wheezing or rhonchi, heart S1-S2 present, rhythmic, soft abdomen, lower extremity edema. SARS COVID-19 positive.  Chest radiograph with no infiltrates.  Blood culture positive for Staph epidermidis, methicillin resistant.  Assessment & Plan:   Principal Problem:   COVID-19 virus infection Active Problems:   Uncontrolled type 2 diabetes mellitus with microalbuminuria (HCC)   HTN (hypertension)   HLD (hyperlipidemia)   Syncope and collapse   Syncope   Elevated troponin   Atrial fibrillation, chronic (HCC)   Hypotension   1. SARS COVID 19 viral pneumonia.   RR:20 Pulse oxymetry: 97%  Fi02: 21% room air  COVID-19 Labs  Recent Labs    09/09/20 1758 09/10/20 0431 09/11/20 0555  DDIMER 0.94* 0.75* 0.30  FERRITIN 56 55 70  LDH 174  --   --   CRP 1.7* 1.0* 1.1*    Lab Results  Component Value Date   SARSCOV2NAA NEGATIVE 02/18/2020   SARSCOV2NAA NEGATIVE 03/19/2019   Patient now on room air, inflammatory markers continue to trend down.  Elevated troponin due to acute viral process, not consistent with acute coronary syndrome.   Tolerating well medical therapy with systemic steroids, and remdesivir. Continue with antitussive agents, bronchodilators and airway clearing  techniques.   If patient continue to be stable to consider discharging before completing #5 dose of remdesivir.   2. Incidental positive blood cultures, staph epidermidis methicillin resistant. Wbc 6,2 #4 bottles positive, for now will place patient on IV vancomycin and will follow on repeat cultures if negative will consider contamination.  Further workup with echocardiogram if repeat cultures positive.   3. Uncontrolled T2DM Hgb A1c 8.0 Dyslipidemia fasting glucose this am 119. Continue glucose cover and monitoring with insulin sliding scale plus basal insulin.   Continue with atorvastatin  4. Chronic atrial fibrillation. Rate controlled, resume apixaban for anticoagulation  5. Urinary retention. Add flomax, attempt voiding trial in am.   Status is: Inpatient  Remains inpatient appropriate because:IV treatments appropriate due to intensity of illness or inability to take PO   Dispo: The patient is from: Home              Anticipated d/c is to: Home              Anticipated d/c date is: 1 day              Patient currently is not medically stable to d/c. Possible dc home if blood cultures negative.    DVT prophylaxis: Apixaban   Code Status:    full  Family Communication:  No family at the bedside     Subjective: Patient feeling better, dyspnea improving, no chest pain, improved po intake.   Objective: Vitals:   09/10/20 2308 09/11/20 0424 09/11/20 0752 09/11/20 1157  BP: 119/75 133/74 137/79 (!) 151/76  Pulse: (!) 57 Marland Kitchen)  58 (!) 56 60  Resp: 18 17 20    Temp: 98 F (36.7 C) 97.9 F (36.6 C) 97.9 F (36.6 C) 97.7 F (36.5 C)  TempSrc:  Oral Oral   SpO2: 97% 97% 98% 100%  Weight:      Height:        Intake/Output Summary (Last 24 hours) at 09/11/2020 1623 Last data filed at 09/11/2020 1524 Gross per 24 hour  Intake 590 ml  Output 1825 ml  Net -1235 ml   Filed Weights   09/09/20 1839  Weight: 104.8 kg    Examination:   General: Not in pain or  dyspnea Neurology: Awake and alert, non focal  E ENT: no pallor, no icterus, oral mucosa moist Cardiovascular: No JVD. S1-S2 present, rhythmic, no gallops, rubs, or murmurs. No lower extremity edema. Pulmonary: positive breath sounds bilaterally, with no wheezing, rhonchi or rales. Gastrointestinal. Abdomen soft and non tender Skin. No rashes Musculoskeletal: no joint deformities     Data Reviewed: I have personally reviewed following labs and imaging studies  CBC: Recent Labs  Lab 09/09/20 1144 09/10/20 0431 09/11/20 0555  WBC 6.6 5.2 6.2  NEUTROABS  --  2.0 3.4  HGB 14.0 12.7* 12.0*  HCT 44.5 37.4* 36.8*  MCV 100.9* 96.6 97.1  PLT 191 211 [id number]   Basic Metabolic Panel: Recent Labs  Lab 09/09/20 1144 09/10/20 0431 09/11/20 0555  NA 137 141 139  K 5.1 3.9 3.8  CL 105 109 105  CO2 20* 25 25  GLUCOSE 130* 139* 119*  BUN 16 21 19   CREATININE 1.06 0.99 0.68  CALCIUM 8.5* 8.2* 8.3*  MG  --  1.8 1.9  PHOS  --   --  2.9   GFR: Estimated Creatinine Clearance: 93.6 mL/min (by C-G formula based on SCr of 0.68 mg/dL). Liver Function Tests: Recent Labs  Lab 09/10/20 0431 09/11/20 0555  AST 27 21  ALT 14 14  ALKPHOS 49 43  BILITOT 1.1 1.1  PROT 6.4* 6.3*  ALBUMIN 3.0* 3.0*   No results for input(s): LIPASE, AMYLASE in the last 168 hours. No results for input(s): AMMONIA in the last 168 hours. Coagulation Profile: No results for input(s): INR, PROTIME in the last 168 hours. Cardiac Enzymes: No results for input(s): CKTOTAL, CKMB, CKMBINDEX, TROPONINI in the last 168 hours. BNP (last 3 results) No results for input(s): PROBNP in the last 8760 hours. HbA1C: Recent Labs    09/10/20 0431  HGBA1C 8.0*   CBG: Recent Labs  Lab 09/10/20 1142 09/10/20 1604 09/10/20 2301 09/11/20 0751 09/11/20 1154  GLUCAP 146* 158* 149* 103* 138*   Lipid Profile: Recent Labs    09/09/20 1758  TRIG 157*   Thyroid Function Tests: No results for input(s): TSH, T4TOTAL,  FREET4, T3FREE, THYROIDAB in the last 72 hours. Anemia Panel: Recent Labs    09/10/20 0431 09/11/20 [id number]      Radiology Studies: I have reviewed all of the imaging during this hospital visit personally     Scheduled Meds: . apixaban  5 mg Oral BID  . vitamin C  500 mg Oral Daily  . aspirin EC  81 mg Oral QHS  . atorvastatin  80 mg Oral Daily  . Chlorhexidine Gluconate Cloth  6 each Topical Daily  . insulin aspart  0-5 Units Subcutaneous QHS  . insulin aspart  0-9 Units Subcutaneous TID WC  . insulin glargine  50 Units Subcutaneous BID  . ipratropium  2 puff Inhalation Q6H  .  latanoprost  1 drop Both Eyes QHS  . tamsulosin  0.4 mg Oral QPC breakfast  . vitamin B-12  100 mcg Oral Daily  . zinc sulfate  220 mg Oral Daily   Continuous Infusions: . [START ON 09/12/2020] vancomycin       LOS: 1 day        Alexavier Tsutsui Gerome Apley, MD

## 2020-09-11 NOTE — Progress Notes (Signed)
ANTICOAGULATION CONSULT NOTE -   Pharmacy Consult for Heparin  Indication: chest pain/ACS  No Known Allergies  Patient Measurements: Height: 6\' 1"  (185.4 cm) Weight: 104.8 kg (231 lb) IBW/kg (Calculated) : 79.9 Heparin Dosing Weight: 104.8 kg   Vital Signs: Temp: 98 F (36.7 C) (01/05 2308) BP: 119/75 (01/05 2308) Pulse Rate: 57 (01/05 2308)  Labs: Recent Labs    09/09/20 1144 09/09/20 1523 09/09/20 1758 09/09/20 1758 09/10/20 0035 09/10/20 0431 09/10/20 1511 09/10/20 2238  HGB 14.0  --   --   --   --  12.7*  --   --   HCT 44.5  --   --   --   --  37.4*  --   --   PLT 191  --   --   --   --  211  --   --   APTT  --   --  31   < >  --  126* 87* 46*  HEPARINUNFRC  --   --  1.29*  --   --  1.32*  --   --   CREATININE 1.06  --   --   --   --  0.99  --   --   TROPONINIHS  --  212* 264*  --  250*  --   --   --    < > = values in this interval not displayed.   Estimated Creatinine Clearance: 75.7 mL/min (by C-G formula based on SCr of 0.99 mg/dL).  Medical History: Past Medical History:  Diagnosis Date  . Chronic airway obstruction (HCC)   . Degeneration of intervertebral disc of lumbar region   . Diabetes mellitus without complication (HCC)   . Essential hypertension, benign   . Hyperlipidemia   . Hypertension   . Osteoarthritis of lower extremity   . Pneumonia, organism unspecified(486)   . PSA elevation 07/16/2013   Normal  . Stroke (HCC)     Medications: NKDA Relevant IP meds: ASA 81mg  QD.  Assessment: Pharmacy consulted to dose heparin in this 80 year old male admitted with NSTEMI.   CrCl = 70.7>75.7 ml/min Pt was on Eliquis 5 mg PO BID PTA, last dose on 1/3 at unknown time.    Hgb = 14.0>12.7 ,  Platelets = 191 >211  Date Time HL/aPTT Comment 1/4 1758 aPTT = 31 HL = 1.29; by aPTT subthera 1/5 0431 aPTT 126  HL=1.32; by aPTT suprathera 1/5 1511 aPTT 87 Therapeutic x1; HL not correlating. 1/5 2238 aPTT 46 SUBtherapeutic on 1200 units/hr   Goal of  Therapy:  APTT :  66 - 102  Heparin level 0.3-0.7 units/ml Monitor platelets by anticoagulation protocol: Yes   Plan:   aPTT SUBtherapeutic.  Will increase Heparin rate to 1400 units/hr and recheck aPTT in 8hrs with HL daily until correlating. Will check HL on 1/6 with AM labs.  Will monitor CBC daily.   96 A 09/11/2020,12:05 AM

## 2020-09-11 NOTE — Progress Notes (Signed)
Pharmacy Antibiotic Note  Mathew Baker is a 80 y.o. male admitted on 09/09/2020 with  hypotension dizziness weakness cough congestion most consistent with Covid infection and has subsequently grown out S epidermidis with methicillin resistance in his hlood.  Pharmacy has been consulted for vancomycin dosing. His renal function has been stable since admission and at apparent baseline  Plan: vancomycin 2000 mg IV loading dose, then 1000 IV every 12 hours  Goal AUC 400-550  Expected AUC: 519.4  SCr used: 0.8 mg/dL (rounded up)  Levels as clinically indicated  Daily renal function assessment while on vancomycin  Height: 6\' 1"  (185.4 cm) Weight: 104.8 kg (231 lb) IBW/kg (Calculated) : 79.9  Temp (24hrs), Avg:97.9 F (36.6 C), Min:97.7 F (36.5 C), Max:98 F (36.7 C)  Recent Labs  Lab 09/09/20 1144 09/10/20 0431 09/11/20 0555  WBC 6.6 5.2 6.2  CREATININE 1.06 0.99 0.68    Estimated Creatinine Clearance: 93.6 mL/min (by C-G formula based on SCr of 0.68 mg/dL).    No Known Allergies  Antimicrobials this admission: vancomycin 1/6 >>   Microbiology results: 01/04 BCx: 4/4 S epidermidis (Mec-A positive) 01/05 UCx: pending   Thank you for allowing pharmacy to be a part of this patient's care.  03/05 09/11/2020 1:11 PM

## 2020-09-11 NOTE — Clinical Social Work Note (Signed)
Tried calling patient in room to discuss home health recommendation. No answer. Will try again later.  Charlynn Court, CSW [phone]

## 2020-09-12 DIAGNOSIS — R338 Other retention of urine: Secondary | ICD-10-CM

## 2020-09-12 DIAGNOSIS — U071 COVID-19: Secondary | ICD-10-CM | POA: Diagnosis not present

## 2020-09-12 DIAGNOSIS — R7881 Bacteremia: Secondary | ICD-10-CM | POA: Diagnosis not present

## 2020-09-12 DIAGNOSIS — B957 Other staphylococcus as the cause of diseases classified elsewhere: Secondary | ICD-10-CM

## 2020-09-12 DIAGNOSIS — R55 Syncope and collapse: Secondary | ICD-10-CM | POA: Diagnosis not present

## 2020-09-12 DIAGNOSIS — I482 Chronic atrial fibrillation, unspecified: Secondary | ICD-10-CM | POA: Diagnosis not present

## 2020-09-12 DIAGNOSIS — B9562 Methicillin resistant Staphylococcus aureus infection as the cause of diseases classified elsewhere: Secondary | ICD-10-CM

## 2020-09-12 DIAGNOSIS — Z95818 Presence of other cardiac implants and grafts: Secondary | ICD-10-CM

## 2020-09-12 DIAGNOSIS — R778 Other specified abnormalities of plasma proteins: Secondary | ICD-10-CM | POA: Diagnosis not present

## 2020-09-12 LAB — CBC WITH DIFFERENTIAL/PLATELET
Abs Immature Granulocytes: 0.03 10*3/uL (ref 0.00–0.07)
Basophils Absolute: 0 10*3/uL (ref 0.0–0.1)
Basophils Relative: 1 %
Eosinophils Absolute: 0.2 10*3/uL (ref 0.0–0.5)
Eosinophils Relative: 3 %
HCT: 37.2 % — ABNORMAL LOW (ref 39.0–52.0)
Hemoglobin: 12.4 g/dL — ABNORMAL LOW (ref 13.0–17.0)
Immature Granulocytes: 1 %
Lymphocytes Relative: 22 %
Lymphs Abs: 1.4 10*3/uL (ref 0.7–4.0)
MCH: 32.3 pg (ref 26.0–34.0)
MCHC: 33.3 g/dL (ref 30.0–36.0)
MCV: 96.9 fL (ref 80.0–100.0)
Monocytes Absolute: 0.8 10*3/uL (ref 0.1–1.0)
Monocytes Relative: 12 %
Neutro Abs: 4 10*3/uL (ref 1.7–7.7)
Neutrophils Relative %: 61 %
Platelets: 226 10*3/uL (ref 150–400)
RBC: 3.84 MIL/uL — ABNORMAL LOW (ref 4.22–5.81)
RDW: 13.2 % (ref 11.5–15.5)
WBC: 6.4 10*3/uL (ref 4.0–10.5)
nRBC: 0 % (ref 0.0–0.2)

## 2020-09-12 LAB — COMPREHENSIVE METABOLIC PANEL
ALT: 14 U/L (ref 0–44)
AST: 21 U/L (ref 15–41)
Albumin: 2.8 g/dL — ABNORMAL LOW (ref 3.5–5.0)
Alkaline Phosphatase: 46 U/L (ref 38–126)
Anion gap: 11 (ref 5–15)
BUN: 16 mg/dL (ref 8–23)
CO2: 22 mmol/L (ref 22–32)
Calcium: 8.3 mg/dL — ABNORMAL LOW (ref 8.9–10.3)
Chloride: 108 mmol/L (ref 98–111)
Creatinine, Ser: 0.69 mg/dL (ref 0.61–1.24)
GFR, Estimated: >60 mL/min (ref >60)
Glucose, Bld: 78 mg/dL (ref 70–99)
Potassium: 4 mmol/L (ref 3.5–5.1)
Sodium: 141 mmol/L (ref 135–145)
Total Bilirubin: 0.9 mg/dL (ref 0.3–1.2)
Total Protein: 5.7 g/dL — ABNORMAL LOW (ref 6.5–8.1)

## 2020-09-12 LAB — URINE CULTURE: Culture: NO GROWTH

## 2020-09-12 LAB — GLUCOSE, CAPILLARY
Glucose-Capillary: 93 mg/dL (ref 70–99)
Glucose-Capillary: 117 mg/dL — ABNORMAL HIGH (ref 70–99)
Glucose-Capillary: 118 mg/dL — ABNORMAL HIGH (ref 70–99)
Glucose-Capillary: 176 mg/dL — ABNORMAL HIGH (ref 70–99)

## 2020-09-12 LAB — D-DIMER, QUANTITATIVE: D-Dimer, Quant: 0.74 ug/mL-FEU — ABNORMAL HIGH (ref 0.00–0.50)

## 2020-09-12 LAB — PHOSPHORUS: Phosphorus: 2.9 mg/dL (ref 2.5–4.6)

## 2020-09-12 LAB — C-REACTIVE PROTEIN: CRP: 1 mg/dL — ABNORMAL HIGH (ref <1.0)

## 2020-09-12 LAB — MAGNESIUM: Magnesium: 1.8 mg/dL (ref 1.7–2.4)

## 2020-09-12 LAB — LACTATE DEHYDROGENASE: LDH: 153 U/L (ref 98–192)

## 2020-09-12 LAB — FERRITIN: Ferritin: 61 ng/mL (ref 24–336)

## 2020-09-12 NOTE — Consult Note (Signed)
Mathew Baker  DOB: 23-Feb-1941  MRN: [id number]  Date/Time: 09/12/2020 9:41 AM  REQUESTING PROVIDER: Dr. Sherral Hammers Subjective:  REASON FOR CONSULT: staph epidermidis  bacteremia ? Mathew Baker is a 80 y.o. male with a history of, hypertension, diabetes mellitus, hyperlipidemia, cerebellar stroke presents to the ED after having a fall and near syncope- pt says it was mid morningwhen he was in the toilet cleaning up. He was at the sink  when he felt dizzy, he held the basin and slowly lowered himself down- HE says he did not black out. HE has been having some cough for a week with some chest tightness- He denied any fever. He says his grandson had met him recently before he knew he was positive for covid- Pt has been double vaccinated. When EMS arrived his BP was intially low . In the ED  BP 127/75, Temp 98.1, HR 58 and sats 92% Labs revealed WBC 6.6, HB 14, plt 191, cr 1.06 He tested positive for Covid CXR no infiltrate. EKG showed incomplete RBBB CT head Age related volume loss with slight periventricular small vessel disease, stable. Prior posterior midline cerebellar infarct. Small nearby right posteromedial lacunar infarct in the cerebellum. Prior small infarct in the anterior limb of the right external capsule. No evident acute infarction. Blood cultures sent and he was started on remdisivir. Seen by cardiology Pt had bilateral cerebellar infarct in 2020 and as part of the work up had holter monitoring and a loop recorder was placed. It showed 16 minute episode of Afib and he was started on anticoagulation.    Past Medical History:  Diagnosis Date  . Chronic airway obstruction ([place])   . Degeneration of intervertebral disc of lumbar region   . Diabetes mellitus without complication ([place])   . Essential hypertension, benign   . Hyperlipidemia   . Hypertension   . Osteoarthritis of lower extremity   . Pneumonia, organism unspecified(486)   . PSA elevation 07/16/2013   Normal  . Stroke  [facility])     Past Surgical History:  Procedure Laterality Date  . CATARACT EXTRACTION W/ INTRAOCULAR LENS  IMPLANT, BILATERAL    . COLONOSCOPY  2006   Normal: Repeat in 10 yrs  . LOOP RECORDER INSERTION N/A 02/21/2020   Procedure: LOOP RECORDER INSERTION;  Surgeon: Isaias Cowman, MD;  Location: [facility];  Service: Cardiovascular;  Laterality: N/A;    Social History   Socioeconomic History  . Marital status: Married    Spouse name: Not on file  . Number of children: Not on file  . Years of education: Not on file  . Highest education level: Not on file  Occupational History  . Not on file  Tobacco Use  . Smoking status: Former Smoker    Quit date: 09/06/1958    Years since quitting: 62.0  . Smokeless tobacco: Never Used  Substance and Sexual Activity  . Alcohol use: No    Alcohol/week: 0.0 standard drinks  . Drug use: No  . Sexual activity: Not on file  Other Topics Concern  . Not on file  Social History Narrative   Married   Retired; [employer] 31 yrs; One of the original [employer] in the early 60's   1 daughter   Social Determinants of Health   Financial Resource Strain: Not on file  Food Insecurity: Not on file  Transportation Needs: Not on file  Physical Activity: Not on file  Stress: Not on file  Social Connections: Not on file  Intimate  Partner Violence: Not on file    Family History  Problem Relation Age of Onset  . Diabetes Mother    No Known Allergies  ? Current Facility-Administered Medications  Medication Dose Route Frequency Provider Last Rate Last Admin  . acetaminophen (TYLENOL) suppository 650 mg  650 mg Rectal Q6H PRN Ivor Costa, MD      . albuterol (VENTOLIN HFA) 108 (90 Base) MCG/ACT inhaler 2 puff  2 puff Inhalation Q4H PRN Ivor Costa, MD      . apixaban Arne Cleveland) tablet 5 mg  5 mg Oral BID Tawni Millers, MD   5 mg at 09/12/20 [phone]  . ascorbic acid (VITAMIN C) tablet 500 mg  500 mg Oral Daily Ivor Costa, MD   500 mg at  09/12/20 3734  . aspirin EC tablet 81 mg  81 mg Oral QHS Ivor Costa, MD   81 mg at 09/11/20 2155  . atorvastatin (LIPITOR) tablet 80 mg  80 mg Oral Daily Ivor Costa, MD   80 mg at 09/12/20 2876  . Chlorhexidine Gluconate Cloth 2 % PADS 6 each  6 each Topical Daily Arrien, Jimmy Picket, MD   6 each at 09/11/20 1033  . dextromethorphan-guaiFENesin (MUCINEX DM) 30-600 MG per 12 hr tablet 1 tablet  1 tablet Oral BID PRN Ivor Costa, MD      . hydrALAZINE (APRESOLINE) injection 5 mg  5 mg Intravenous Q2H PRN Ivor Costa, MD      . insulin aspart (novoLOG) injection 0-5 Units  0-5 Units Subcutaneous QHS Ivor Costa, MD      . insulin aspart (novoLOG) injection 0-9 Units  0-9 Units Subcutaneous TID WC Ivor Costa, MD   1 Units at 09/11/20 1718  . insulin glargine (LANTUS) injection 50 Units  50 Units Subcutaneous BID Ivor Costa, MD   50 Units at 09/12/20 [phone]  . ipratropium (ATROVENT HFA) inhaler 2 puff  2 puff Inhalation Q6H Ivor Costa, MD   2 puff at 09/12/20 0824  . latanoprost (XALATAN) 0.005 % ophthalmic solution 1 drop  1 drop Both Eyes QHS Ivor Costa, MD      . ondansetron [facility]) injection 4 mg  4 mg Intravenous Q8H PRN Ivor Costa, MD      . tamsulosin (FLOMAX) capsule 0.4 mg  0.4 mg Oral QPC breakfast Allie Bossier, MD   0.4 mg at 09/12/20 7262  . vancomycin (VANCOCIN) IVPB 1000 mg/200 mL premix  1,000 mg Intravenous Q12H Dallie Piles, RPH 200 mL/hr at 09/12/20 0648 1,000 mg at 09/12/20 0648  . vitamin B-12 (CYANOCOBALAMIN) tablet 100 mcg  100 mcg Oral Daily Ivor Costa, MD   100 mcg at 09/12/20 0355  . zinc sulfate capsule 220 mg  220 mg Oral Daily Ivor Costa, MD   220 mg at 09/12/20 9741     Abtx:  Anti-infectives (From admission, onward)   Start     Dose/Rate Route Frequency Ordered Stop   09/12/20 0600  vancomycin (VANCOCIN) IVPB 1000 mg/200 mL premix        1,000 mg 200 mL/hr over 60 Minutes Intravenous Every 12 hours 09/11/20 1332     09/11/20 1400  vancomycin (VANCOREADY) IVPB  2000 mg/400 mL        2,000 mg 200 mL/hr over 120 Minutes Intravenous  Once 09/11/20 1311 09/11/20 1613   09/10/20 1000  remdesivir 100 mg in sodium chloride 0.9 % 100 mL IVPB  Status:  Discontinued       "Followed by" Linked Group Details  100 mg 200 mL/hr over 30 Minutes Intravenous Daily 09/09/20 1545 09/09/20 1913   09/09/20 1645  remdesivir 200 mg in sodium chloride 0.9% 250 mL IVPB  Status:  Discontinued       "Followed by" Linked Group Details   200 mg 580 mL/hr over 30 Minutes Intravenous Once 09/09/20 1545 09/09/20 1913      REVIEW OF SYSTEMS:  Const: negative fever, negative chills, negative weight loss Eyes: negative diplopia or visual changes, negative eye pain ENT: negative coryza, negative sore throat Resp: + cough, -hemoptysis, +dyspnea Cards:+chest tightness, palpitations, +lower extremity edema GU: negative for frequency, dysuria and hematuria GI: Negative for abdominal pain, diarrhea, bleeding, constipation Skin: negative for rash and pruritus Heme: negative for easy bruising and gum/nose bleeding MS: weakness Neurolo:negative for headaches, ++dizziness,poor balance, fall in the past  uses cane , memory problems  Psych: negative for feelings of anxiety, depression  Endocrine:  diabetes Allergy/Immunology- negative for any medication or food allergies ?  Objective:  VITALS:  BP 117/63 (BP Location: Left Arm)   Pulse (!) 56   Temp 98 F (36.7 C) (Oral)   Resp 20   Ht _0  (1.854 m)   Wt 104.8 kg   SpO2 99%   BMI 30.48 kg/m  PHYSICAL EXAM:  General: Alert, cooperative, no distress, sitting in chair and eating , not sob Head: Normocephalic, without obvious abnormality, atraumatic. Eyes: Conjunctivae clear, anicteric sclerae. Pupils are equal ENT did not examine Neck: Supple, symmetrical, no adenopathy, thyroid: non tender no carotid bruit and no JVD. Back: No CVA tenderness. Lungs: b/l air entry Heart: s1s2 Device felt underneath the chest wall  skin Abdomen: did not examine Extremities: edema legs- left > rt Skin: No rashes or lesions. Or bruising Lymph: Cervical, supraclavicular normal. Neurologic: limited examination moves all extremities, gait not assessed foley catheterl Pertinent Labs Lab Results CBC    Component Value Date/Time   WBC 6.2 09/11/2020 0555   RBC 3.79 (L) 09/11/2020 0555   HGB 12.0 (L) 09/11/2020 0555   HCT 36.8 (L) 09/11/2020 0555   PLT 226 09/11/2020 0555   MCV 97.1 09/11/2020 0555   MCH 31.7 09/11/2020 0555   MCHC 32.6 09/11/2020 0555   RDW 13.3 09/11/2020 0555   LYMPHSABS 1.9 09/11/2020 0555   MONOABS 0.7 09/11/2020 0555   EOSABS 0.2 09/11/2020 0555   BASOSABS 0.0 09/11/2020 0555    CMP Latest Ref Rng & Units 09/11/2020 09/10/2020 09/09/2020  Glucose 70 - 99 mg/dL 119(H) 139(H) 130(H)  BUN 8 - 23 mg/dL _1 Creatinine 0.61 - 1.24 mg/dL 0.68 0.99 1.06  Sodium 135 - 145 mmol/L 139 141 137  Potassium 3.5 - 5.1 mmol/L 3.8 3.9 5.1  Chloride 98 - 111 mmol/L 105 109 105  CO2 22 - 32 mmol/L 25 25 20(L)  Calcium 8.9 - 10.3 mg/dL 8.3(L) 8.2(L) 8.5(L)  Total Protein 6.5 - 8.1 g/dL 6.3(L) 6.4(L) -  Total Bilirubin 0.3 - 1.2 mg/dL 1.1 1.1 -  Alkaline Phos 38 - 126 U/L 43 49 -  AST 15 - 41 U/L 21 27 -  ALT 0 - 44 U/L 14 14 -      Microbiology: Recent Results (from the past 240 hour(s))  Culture, blood (Routine X 2) w Reflex to ID Panel     Status: Abnormal (Preliminary result)   Collection Time: 09/09/20  5:58 PM   Specimen: BLOOD  Result Value Ref Range Status   Specimen Description   Final  BLOOD BLOOD LEFT HAND Performed at [facility], [address] [place], [place] [address]    Special Requests   Final    BOTTLES DRAWN AEROBIC AND ANAEROBIC Blood Culture adequate volume Performed at [facility], [address] [place], [place] [address]    Culture  Setup Time   Final    GRAM POSITIVE COCCI IN BOTH AEROBIC AND ANAEROBIC BOTTLES CRITICAL VALUE NOTED.  VALUE  IS CONSISTENT WITH PREVIOUSLY REPORTED AND CALLED VALUE. Performed at [facility], [address] [place], [place] [address]    Culture (A)  Final    STAPHYLOCOCCUS EPIDERMIDIS SUSCEPTIBILITIES PERFORMED ON PREVIOUS CULTURE WITHIN THE LAST 5 DAYS. Performed at [facility], [place] [address] [place], [place] [address]    Report Status PENDING  Incomplete  Culture, blood (Routine X 2) w Reflex to ID Panel     Status: Abnormal (Preliminary result)   Collection Time: 09/09/20  5:58 PM   Specimen: BLOOD  Result Value Ref Range Status   Specimen Description   Final    BLOOD RIGHT ANTECUBITAL Performed at [facility], [address], [place], [place] [address]    Special Requests   Final    BOTTLES DRAWN AEROBIC AND ANAEROBIC Blood Culture adequate volume Performed at [facility], [address] [place], [place] [address]    Culture  Setup Time   Final    GRAM POSITIVE COCCI IN BOTH AEROBIC AND ANAEROBIC BOTTLES Organism ID to follow CRITICAL RESULT CALLED TO, READ BACK BY AND VERIFIED WITH: Sander Radon 1644 09/10/2020 DB Performed at [facility], [address] [place], [place] [address]    Culture (A)  Final    STAPHYLOCOCCUS EPIDERMIDIS SUSCEPTIBILITIES TO FOLLOW Performed at [facility], [place] [address] [place], [place] [address]    Report Status PENDING  Incomplete  Blood Culture ID Panel (Reflexed)     Status: Abnormal   Collection Time: 09/09/20  5:58 PM  Result Value Ref Range Status   Enterococcus faecalis NOT DETECTED NOT DETECTED Final   Enterococcus Faecium NOT DETECTED NOT DETECTED Final   Listeria monocytogenes NOT DETECTED NOT DETECTED Final   Staphylococcus species DETECTED (A) NOT DETECTED Final    Comment: CRITICAL RESULT CALLED TO, READ BACK BY AND VERIFIED WITH: Sander Radon 1644 09/10/2020 DB    Staphylococcus aureus (BCID) NOT DETECTED NOT DETECTED Final   Staphylococcus epidermidis  DETECTED (A) NOT DETECTED Final    Comment: Methicillin (oxacillin) resistant coagulase negative staphylococcus. Possible blood culture contaminant (unless isolated from more than one blood culture draw or clinical case suggests pathogenicity). No antibiotic treatment is indicated for blood  culture contaminants. CRITICAL RESULT CALLED TO, READ BACK BY AND VERIFIED WITH: BRANDON BEERS, PHAR 1644 09/10/2020 DB    Staphylococcus lugdunensis NOT DETECTED NOT DETECTED Final   Streptococcus species NOT DETECTED NOT DETECTED Final   Streptococcus agalactiae NOT DETECTED NOT DETECTED Final   Streptococcus pneumoniae NOT DETECTED NOT DETECTED Final   Streptococcus pyogenes NOT DETECTED NOT DETECTED Final   A.calcoaceticus-baumannii NOT DETECTED NOT DETECTED Final   Bacteroides fragilis NOT DETECTED NOT DETECTED Final   Enterobacterales NOT DETECTED NOT DETECTED Final   Enterobacter cloacae complex NOT DETECTED NOT DETECTED Final   Escherichia coli NOT DETECTED NOT DETECTED Final   Klebsiella aerogenes NOT DETECTED NOT DETECTED Final   Klebsiella oxytoca NOT DETECTED NOT DETECTED Final   Klebsiella pneumoniae NOT DETECTED NOT DETECTED Final   Proteus species  NOT DETECTED NOT DETECTED Final   Salmonella species NOT DETECTED NOT DETECTED Final   Serratia marcescens NOT DETECTED NOT DETECTED Final   Haemophilus influenzae NOT DETECTED NOT DETECTED Final   Neisseria meningitidis NOT DETECTED NOT DETECTED Final   Pseudomonas aeruginosa NOT DETECTED NOT DETECTED Final   Stenotrophomonas maltophilia NOT DETECTED NOT DETECTED Final   Candida albicans NOT DETECTED NOT DETECTED Final   Candida auris NOT DETECTED NOT DETECTED Final   Candida glabrata NOT DETECTED NOT DETECTED Final   Candida krusei NOT DETECTED NOT DETECTED Final   Candida parapsilosis NOT DETECTED NOT DETECTED Final   Candida tropicalis NOT DETECTED NOT DETECTED Final   Cryptococcus neoformans/gattii NOT DETECTED NOT DETECTED Final    Methicillin resistance mecA/C DETECTED (A) NOT DETECTED Final    Comment: CRITICAL RESULT CALLED TO, READ BACK BY AND VERIFIED WITH: Sander Radon 1644 09/10/2020 DB Performed at [facility], [address] [place], [place] [address]   Urine Culture     Status: None   Collection Time: 09/10/20 10:43 AM   Specimen: Urine, Random  Result Value Ref Range Status   Specimen Description   Final    URINE, RANDOM Performed at [facility], [address] [place], [place] [address]    Special Requests   Final    NONE Performed at [facility], [address], [place], [place] [address]    Culture   Final    NO GROWTH Performed at [facility], [place] [address] [place], [place] [address]    Report Status 09/12/2020 FINAL  Final  CULTURE, BLOOD (ROUTINE X 2) w Reflex to ID Panel     Status: None (Preliminary result)   Collection Time: 09/10/20  7:02 PM   Specimen: BLOOD  Result Value Ref Range Status   Specimen Description BLOOD BLOOD RIGHT HAND  Final   Special Requests   Final    BOTTLES DRAWN AEROBIC AND ANAEROBIC Blood Culture adequate volume   Culture   Final    NO GROWTH 2 DAYS Performed at [facility], [address], [place], [place] [address]    Report Status PENDING  Incomplete  CULTURE, BLOOD (ROUTINE X 2) w Reflex to ID Panel     Status: None (Preliminary result)   Collection Time: 09/10/20  7:04 PM   Specimen: BLOOD  Result Value Ref Range Status   Specimen Description BLOOD RIGHT ANTECUBITAL  Final   Special Requests   Final    BOTTLES DRAWN AEROBIC AND ANAEROBIC Blood Culture adequate volume   Culture   Final    NO GROWTH 2 DAYS Performed at [facility], [address] [place], [place] [address]    Report Status PENDING  Incomplete    IMAGING RESULTS:  I have personally reviewed the films  ? Impression/Recommendation ?Near syncope.  Patient with underlying cerebellar infarcts and dizziness. CT  head no acute infarct.  SARS-CoV-2 illness.  Patient has no pneumonia.  Mildly symptomatic.  He stable vaccinated.  He is getting remdesivir.  Staph epidermidis bacteremia 4 out of 4.  From 2 different sites.  This cannot be considered as a contaminant.  Because of the loop recorder  we need to rule out  vegetations.  He will need 2D echo, may need TEE, and the loop recorder will have to be removed.  He will need 4 to 6 weeks of IV antibiotics.  Currently on vancomycin. Repeat blood cultures so far negative.  Diabetes mellitus.  Hemoglobin A1c 8.  Poorly controlled. ? Patient was found to have urinary retention during this hospitalization.  Foley catheter was inserted.    Flomax has been added by the primary team.  Plan is for voiding trial.   History of A. fib History of cerebellar infarcts ? ___________________________________________________ Discussed with patient, cardiologist and hospitalist.  Note:  This document was prepared using Dragon voice recognition software and may include unintentional dictation errors.

## 2020-09-12 NOTE — Progress Notes (Signed)
Pharmacy Antibiotic Note  Mathew Baker is a 80 y.o. male admitted on 09/09/2020 with  hypotension dizziness weakness cough congestion most consistent with Covid infection and has subsequently grown out S epidermidis with methicillin resistance in his blood.  Pharmacy was consulted for vancomycin dosing. His renal function has been stable since admission and at apparent baseline  Plan: continue vancomycin 1000 mg  IV every 12 hours  Goal AUC 400-550  Expected AUC: 519.4  SCr used: 0.8 mg/dL (rounded up)  Levels as clinically indicated  Daily renal function assessment while on vancomycin  Height: 6\' 1"  (185.4 cm) Weight: 104.8 kg (231 lb) IBW/kg (Calculated) : 79.9  Temp (24hrs), Avg:98 F (36.7 C), Min:97.7 F (36.5 C), Max:98.2 F (36.8 C)  Recent Labs  Lab 09/09/20 1144 09/10/20 0431 09/11/20 0555  WBC 6.6 5.2 6.2  CREATININE 1.06 0.99 0.68    Estimated Creatinine Clearance: 93.6 mL/min (by C-G formula based on SCr of 0.68 mg/dL).    No Known Allergies  Antimicrobials this admission: vancomycin 1/6 >>   Microbiology results: 01/04 BCx: 4/4 S epidermidis (Mec-A positive) 01/05 NCx: NG x 2 days 01/05 UCx: NG final  Thank you for allowing pharmacy to be a part of this patient's care.  Dallie Piles 09/12/2020 7:15 AM

## 2020-09-12 NOTE — Care Management Important Message (Signed)
Important Message  Patient Details  Name: Mathew Baker MRN: [id number] Date of Birth: 1941-03-29   Medicare Important Message Given:  N/A - LOS <3 / Initial given by admissions  Initial Medicare IM reviewed with patient's spouse, Mathew Baker by Meredith Mody, Patient Access Associate on 09/11/2020 at 9:46am.    Dannette Barbara 09/12/2020, 8:35 AM

## 2020-09-12 NOTE — Progress Notes (Signed)
PROGRESS NOTE    Mathew Baker  ZCH:[id number] DOB: [birth year] DOA: 09/09/2020 PCP: Kirk Ruths, MD     Brief Narrative:  80 y.o. WM PMHx HTN, HLD, DM type II with hyperglycemia, stroke, Atrial fibrillation on Eliquis, BPH,   Presents with syncope and shortness of breath.  Patient states that he has been having cough, shortness of breath for more than 2 weeks.  He has chest pressure, denies active chest pain.  No fever or chills.  He has nausea, no vomiting, diarrhea or abdominal pain.  No symptoms of UTI.  No rectal bleeding or dark stool.  Patient states that he had lightheadedness, and passed out in the bathroom. He did did strike his head.  No unilateral numbness or tingling see extremities.  No facial droop or slurred speech. Patient was found to have hypotension with blood pressure 89/41 in ed, which improved to 139/79 later one without treatment.   ED Course: pt was found to have positive Covid Ag test, WBC 6.6, troponin 212, BNP 356, pending urinalysis, electrolytes renal function okay, temperature normal, heart rate 72, RR 15, oxygen saturation 94% on room air, chest x-ray negative.  CT head is negative for acute intracranial abnormalities.  CT of C-spine is negative for acute issues.  Patient is placed on MedSurg bed for observation.   Subjective: 1/7 afebrile overnight, A/O x4.   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  Chest x-ray negative for infiltration.  Will not start remdesivir and will steroid.  Will check inflammatory markers. -vitamin C, zinc.  -Bronchodilators -PRN Mucinex for cough -f/u Blood culture -Gentle IV fluid:  -If patient develops oxygen desaturation, Will ask the patient to maintain an awake prone position for 16+ hours a day, if possible, with a minimum of 2-3 hours at a time -Will attempt to maintain euvolemia to a net negative fluid status  Uncontrolled type 2 diabetes mellitus with microalbuminuria ([place]):  -1/5 hemoglobin A1c = 8.0  - Lantus 50 units  BID -Sensitive SSI  HTN (hypertension): -Hold blood pressure medications due to hypotension -IV hydralazine as needed   Atrial fibrillation, chronic:  -Patient is taking Eliquis, not nodal blockers.  Heart rate is 72 -Switch Eliquis to IV heparin due to elevated troponin - 1/6 cardiology signed off   Elevated troponin:  Troponin 212, no chest pain, possibly due to demand ischemia.  Dr. Nehemiah Massed of cardiology is consulted -Per cardiology; Continue serial ECG and enzymes to assess for possible rhythm disturbances causing weakness fatigue or syncope and/or possibility of myocardial infarction  Hypotension:  -Blood pressure 89/41, which improved to 139/79.  Etiology is not clear.  Patient does not seem to have sepsis clinically.  Possibly due to dehydration and continuation of Zestoretic -IV fluid:  500 cc normal saline -Monitor blood pressure closely -Hold Zestoretic -Resolved  Syncope:  No focal neuro deficit on physical examination.  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  CT of C-spine is negative.  Possibly due to hypotension.  Patient is on Eliquis, low suspicions for PE.  Will check D-dimer.  -PT; recommends home  health PT -Treat hypotension  HLD (hyperlipidemia) -Lipitor  Acute urinary retention/UTI? -1/5 urine culture negative -Bladder scan, performed in and out catheter and send urine for culture.   -Flomax 0.4 mg daily -1/8 we will attempt voiding trial  Positive Staph epidermidis bacteremia -4/4 bottles positive for MRSE.  Would consider this a true infection -Currently patient not spiking fevers, negative leukocytosis, will reorder cultures. -Obtain procalcitonin -1/6 vancomycin empirically started -1/7 consult ID - 1/7 echocardiogram pending -1/7 per ID may also require TEE secondary to patient having loop recorder which will have to be removed.  Consult cardiology in a.m. - 1/7 per ID will require 4 to 6 weeks of IV antibiotics    DVT prophylaxis: IV heparin Code Status: Full Family Communication:  Status is: Inpatient    Dispo: The patient is from: Home              Anticipated d/c is to: Home;               Anticipated d/c date is: 1/10              Patient currently stable      Consultants:  Dr. Nehemiah Massed of cardiology   Procedures/Significant Events:    I have personally reviewed and interpreted all radiology studies and my findings are as above.  VENTILATOR SETTINGS: Room air 1/7 SPO2 percent   Cultures 1/4 blood RIGHT AC positive staph epidermidis (MRSE): 1/4 blood LEFT hand positive staph epidermidis (MRSE) 1/5.  Urine negative final 1/5 blood RIGHT hand NGTD 1/5 blood RIGHT AC NGTD   Antimicrobials: Anti-infectives (From admission, onward)   Start     Ordered Stop   09/12/20 0600  vancomycin (VANCOCIN) IVPB 1000 mg/200 mL premix        09/11/20 1332     09/11/20 1400  vancomycin (VANCOREADY) IVPB 2000 mg/400 mL        09/11/20 1311 09/11/20 1613   09/10/20 1000  remdesivir 100 mg in sodium chloride 0.9 % 100 mL IVPB  Status:  Discontinued       "Followed by" Linked Group Details   09/09/20 1545 09/09/20 1913   09/09/20 1645  remdesivir 200  mg in sodium chloride 0.9% 250 mL IVPB  Status:  Discontinued       "Followed by" Linked Group Details   09/09/20 1545 09/09/20 1913      Devices    LINES / TUBES:      Continuous Infusions: . vancomycin 1,000 mg (09/12/20 0648)     Objective: Vitals:   09/11/20 1713 09/11/20 2109 09/12/20 0433 09/12/20 0820  BP: (!) 142/71 124/69 (!) 143/64 117/63  Pulse: (!) 55 (!) 57 (!) 57 (!) 56  Resp:  20 20 20   Temp: 98.1 F (36.7 C) 98.1 F (36.7 C) 98.2 F (36.8 C) 98 F (36.7 C)  TempSrc: Oral Oral Oral Oral  SpO2: 97% 95% 96% 99%  Weight:      Height:        Intake/Output Summary (Last 24 hours) at 09/12/2020 0840 Last data filed at 09/12/2020 0500 Gross per 24 hour  Intake 590  ml  Output 1000 ml  Net -410 ml   Filed Weights   09/09/20 1839  Weight: 104.8 kg    Examination:  General: A/O x4, No acute respiratory distress Eyes: negative scleral hemorrhage, negative anisocoria, negative icterus ENT: Negative Runny nose, negative gingival bleeding, Neck:  Negative scars, masses, torticollis, lymphadenopathy, JVD Lungs: Clear to auscultation bilaterally without wheezes or crackles Cardiovascular: Regular rate and rhythm without murmur gallop or rub normal S1 and S2 Abdomen: negative abdominal pain, nondistended, positive soft, bowel sounds, no rebound, no ascites, no appreciable mass Extremities: No significant cyanosis, clubbing, or edema bilateral lower extremities Skin: Negative rashes, lesions, ulcers Psychiatric:  Negative depression, negative anxiety, negative fatigue, negative mania  Central nervous system:  Cranial nerves II through XII intact, tongue/uvula midline, all extremities muscle strength 5/5, sensation intact throughout, negative dysarthria, negative expressive aphasia, negative receptive aphasia.  .     Data Reviewed: Care during the described time interval was provided by me .  I have reviewed this patient's available data, including medical  history, events of note, physical examination, and all test results as part of my evaluation.  CBC: Recent Labs  Lab 09/09/20 1144 09/10/20 0431 09/11/20 0555  WBC 6.6 5.2 6.2  NEUTROABS  --  2.0 3.4  HGB 14.0 12.7* 12.0*  HCT 44.5 37.4* 36.8*  MCV 100.9* 96.6 97.1  PLT 191 211 [id number]   Basic Metabolic Panel: Recent Labs  Lab 09/09/20 1144 09/10/20 0431 09/11/20 0555  NA 137 141 139  K 5.1 3.9 3.8  CL 105 109 105  CO2 20* 25 25  GLUCOSE 130* 139* 119*  BUN 16 21 19   CREATININE 1.06 0.99 0.68  CALCIUM 8.5* 8.2* 8.3*  MG  --  1.8 1.9  PHOS  --   --  2.9   GFR: Estimated Creatinine Clearance: 93.6 mL/min (by C-G formula based on SCr of 0.68 mg/dL). Liver Function Tests: Recent Labs  Lab 09/10/20 0431 09/11/20 0555  AST 27 21  ALT 14 14  ALKPHOS 49 43  BILITOT 1.1 1.1  PROT 6.4* 6.3*  ALBUMIN 3.0* 3.0*   No results for input(s): LIPASE, AMYLASE in the last 168 hours. No results for input(s): AMMONIA in the last 168 hours. Coagulation Profile: No results for input(s): INR, PROTIME in the last 168 hours. Cardiac Enzymes: No results for input(s): CKTOTAL, CKMB, CKMBINDEX, TROPONINI in the last 168 hours. BNP (last 3 results) No results for input(s): PROBNP in the last 8760 hours. HbA1C: Recent Labs    09/10/20 0431  HGBA1C 8.0*   CBG: Recent Labs  Lab 09/11/20 0751 09/11/20 1154 09/11/20 1711 09/11/20 2112 09/12/20 0821  GLUCAP 103* 138* 141* 124* 93   Lipid Profile: Recent Labs    09/09/20 1758  TRIG 157*   Thyroid Function Tests: No results for input(s): TSH, T4TOTAL, FREET4, T3FREE, THYROIDAB in the last 72 hours. Anemia Panel: Recent Labs    09/10/20 0431 09/11/20 0555  FERRITIN 55 70   Sepsis Labs: Recent Labs  Lab 09/09/20 1758 09/10/20 2238 09/11/20 0555  PROCALCITON <0.10 <0.10 <0.10    Recent Results (from the past 240 hour(s))  Culture, blood (Routine X 2) w Reflex to ID Panel     Status: Abnormal (Preliminary result)    Collection Time: 09/09/20  5:58 PM   Specimen: BLOOD  Result Value Ref Range Status   Specimen Description   Final    BLOOD BLOOD LEFT HAND Performed at [facility], [address] [place],  [place] [address]    Special Requests   Final    BOTTLES DRAWN AEROBIC AND ANAEROBIC Blood Culture adequate volume Performed at [facility], [address] [place], [place] [address]    Culture  Setup Time   Final    GRAM POSITIVE COCCI IN BOTH AEROBIC AND ANAEROBIC BOTTLES CRITICAL VALUE NOTED.  VALUE IS CONSISTENT WITH PREVIOUSLY REPORTED AND CALLED VALUE. Performed at [facility], [address] [place], [place] [address]    Culture (A)  Final    STAPHYLOCOCCUS EPIDERMIDIS SUSCEPTIBILITIES PERFORMED ON PREVIOUS CULTURE WITHIN THE LAST 5 DAYS. Performed at [facility], [place] [address], [place], [place] [address]    Report Status PENDING  Incomplete  Culture, blood (Routine X 2) w Reflex to ID Panel     Status: Abnormal (Preliminary result)   Collection Time: 09/09/20  5:58 PM   Specimen: BLOOD  Result Value Ref Range Status   Specimen Description   Final    BLOOD RIGHT ANTECUBITAL Performed at [facility], [address] [place], [place] [address]    Special Requests   Final    BOTTLES DRAWN AEROBIC AND ANAEROBIC Blood Culture adequate volume Performed at [facility], [address] [place], [place] [address]    Culture  Setup Time   Final    GRAM POSITIVE COCCI IN BOTH AEROBIC AND ANAEROBIC BOTTLES Organism ID to follow CRITICAL RESULT CALLED TO, READ BACK BY AND VERIFIED WITH: Sander Radon 1644 09/10/2020 DB Performed at [facility], [address], [place], [place] [address]    Culture (A)  Final    STAPHYLOCOCCUS EPIDERMIDIS SUSCEPTIBILITIES TO FOLLOW Performed at [facility], [place] [address] [place], [place] [address]    Report Status PENDING  Incomplete  Blood Culture ID Panel (Reflexed)      Status: Abnormal   Collection Time: 09/09/20  5:58 PM  Result Value Ref Range Status   Enterococcus faecalis NOT DETECTED NOT DETECTED Final   Enterococcus Faecium NOT DETECTED NOT DETECTED Final   Listeria monocytogenes NOT DETECTED NOT DETECTED Final   Staphylococcus species DETECTED (A) NOT DETECTED Final    Comment: CRITICAL RESULT CALLED TO, READ BACK BY AND VERIFIED WITH: Sander Radon 1644 09/10/2020 DB    Staphylococcus aureus (BCID) NOT DETECTED NOT DETECTED Final   Staphylococcus epidermidis DETECTED (A) NOT DETECTED Final    Comment: Methicillin (oxacillin) resistant coagulase negative staphylococcus. Possible blood culture contaminant (unless isolated from more than one blood culture draw or clinical case suggests pathogenicity). No antibiotic treatment is indicated for blood  culture contaminants. CRITICAL RESULT CALLED TO, READ BACK BY AND VERIFIED WITH: BRANDON BEERS, PHAR 1644 09/10/2020 DB    Staphylococcus lugdunensis NOT DETECTED NOT DETECTED Final   Streptococcus species NOT DETECTED NOT DETECTED Final   Streptococcus agalactiae NOT DETECTED NOT DETECTED Final   Streptococcus pneumoniae NOT DETECTED NOT DETECTED Final   Streptococcus pyogenes NOT DETECTED NOT DETECTED Final   A.calcoaceticus-baumannii NOT DETECTED NOT DETECTED Final   Bacteroides fragilis NOT DETECTED NOT DETECTED Final   Enterobacterales NOT DETECTED NOT DETECTED Final   Enterobacter cloacae complex NOT DETECTED NOT DETECTED Final   Escherichia coli NOT DETECTED NOT DETECTED Final   Klebsiella aerogenes NOT DETECTED NOT DETECTED Final   Klebsiella oxytoca NOT DETECTED NOT DETECTED Final   Klebsiella pneumoniae NOT DETECTED NOT DETECTED Final   Proteus species NOT DETECTED NOT DETECTED Final   Salmonella species NOT DETECTED NOT DETECTED Final  Serratia marcescens NOT DETECTED NOT DETECTED Final   Haemophilus influenzae NOT DETECTED NOT DETECTED Final   Neisseria meningitidis NOT DETECTED  NOT DETECTED Final   Pseudomonas aeruginosa NOT DETECTED NOT DETECTED Final   Stenotrophomonas maltophilia NOT DETECTED NOT DETECTED Final   Candida albicans NOT DETECTED NOT DETECTED Final   Candida auris NOT DETECTED NOT DETECTED Final   Candida glabrata NOT DETECTED NOT DETECTED Final   Candida krusei NOT DETECTED NOT DETECTED Final   Candida parapsilosis NOT DETECTED NOT DETECTED Final   Candida tropicalis NOT DETECTED NOT DETECTED Final   Cryptococcus neoformans/gattii NOT DETECTED NOT DETECTED Final   Methicillin resistance mecA/C DETECTED (A) NOT DETECTED Final    Comment: CRITICAL RESULT CALLED TO, READ BACK BY AND VERIFIED WITH: Darrin Luis, PHAR 1644 09/10/2020 DB Performed at [facility], [address] [place], [place] [address]   CULTURE, BLOOD (ROUTINE X 2) w Reflex to ID Panel     Status: None (Preliminary result)   Collection Time: 09/10/20  7:02 PM   Specimen: BLOOD  Result Value Ref Range Status   Specimen Description BLOOD BLOOD RIGHT HAND  Final   Special Requests   Final    BOTTLES DRAWN AEROBIC AND ANAEROBIC Blood Culture adequate volume   Culture   Final    NO GROWTH 2 DAYS Performed at [facility], [address] [place], [place] [address]    Report Status PENDING  Incomplete  CULTURE, BLOOD (ROUTINE X 2) w Reflex to ID Panel     Status: None (Preliminary result)   Collection Time: 09/10/20  7:04 PM   Specimen: BLOOD  Result Value Ref Range Status   Specimen Description BLOOD RIGHT ANTECUBITAL  Final   Special Requests   Final    BOTTLES DRAWN AEROBIC AND ANAEROBIC Blood Culture adequate volume   Culture   Final    NO GROWTH 2 DAYS Performed at [facility], [address], [place], [place] [address]    Report Status PENDING  Incomplete         Radiology Studies: No results found.      Scheduled Meds: . apixaban  5 mg Oral BID  . vitamin C  500 mg Oral Daily  . aspirin EC  81 mg Oral QHS  . atorvastatin   80 mg Oral Daily  . Chlorhexidine Gluconate Cloth  6 each Topical Daily  . insulin aspart  0-5 Units Subcutaneous QHS  . insulin aspart  0-9 Units Subcutaneous TID WC  . insulin glargine  50 Units Subcutaneous BID  . ipratropium  2 puff Inhalation Q6H  . latanoprost  1 drop Both Eyes QHS  . tamsulosin  0.4 mg Oral QPC breakfast  . vitamin B-12  100 mcg Oral Daily  . zinc sulfate  220 mg Oral Daily   Continuous Infusions: . vancomycin 1,000 mg (09/12/20 0648)     LOS: 2 days    Time spent:40 min    Vanisha Whiten, Geraldo Docker, MD [facility] Pager [phone]  If 7PM-7AM, please contact night-coverage [email or web address] Password [email or web address] 09/12/2020, 8:40 AM

## 2020-09-12 NOTE — Progress Notes (Signed)
Physical Therapy Treatment Patient Details Name: Mathew Baker MRN: [id number] DOB: Jul 03, 1941 Today's Date: 09/12/2020    History of Present Illness 80 y.o. male with medical history significant of hypertension, hyperlipidemia, diabetes mellitus, stroke, atrial fibrillation on Eliquis, BPH, who presents with syncope and shortness of breath.     Patient states that he has been having cough, shortness of breath for more than 2 weeks.  Tested + for Covid in ED.    PT Comments    Pt was in bed with RN at bedside upon arriving. He agrees to PT session and is cooperative and motivated throughout. Eager for OOB activity. Was able to exit bed without assistance. Stood and ambulated around room with RW with supervision. Unsafe to ambulate without AD. Will trial SPC next session. Recommend HHPT however pt states he does not want follow up PT. " I can do my own rehab.I just finished with PT from my previous stroke." Acute PT will continue to follow per POC and advance per pt tolerance.    Follow Up Recommendations  Other (comment);Home health PT (pt does not want follow up PT. " I can do my own rehab.")     Equipment Recommendations  None recommended by PT    Recommendations for Other Services       Precautions / Restrictions Precautions Precautions: Fall Restrictions Weight Bearing Restrictions: No    Mobility  Bed Mobility Overal bed mobility: Modified Independent      General bed mobility comments: flat bed. no assistance or cues  Transfers Overall transfer level: Needs assistance Equipment used: Rolling walker (2 wheeled) Transfers: Sit to/from Stand Sit to Stand: Supervision         General transfer comment: Pt was alert and oriented and eager for OOB activity. on RM air throughout without O2 concerns  Ambulation/Gait Ambulation/Gait assistance: Supervision Gait Distance (Feet): 50 Feet Assistive device: Rolling walker (2 wheeled);None   Gait velocity: WNL   General  Gait Details: Pt was able to ambulate around room with RW with supervision only. attempted ambulation without AD however pt unsafe. recommend continued use of RW goping forward      Balance Overall balance assessment: Needs assistance Sitting-balance support: Feet supported Sitting balance-Leahy Scale: Good     Standing balance support: During functional activity Standing balance-Leahy Scale: Fair Standing balance comment: Pt is unsafe without UE support however no LOB with use of RW         Cognition Arousal/Alertness: Awake/alert Behavior During Therapy: WFL for tasks assessed/performed Overall Cognitive Status: Within Functional Limits for tasks assessed          General Comments: Pt was A and O x 4 and able to follow commands throughout consistently.             Pertinent Vitals/Pain Pain Assessment: No/denies pain Pain Score: 0-No pain           PT Goals (current goals can now be found in the care plan section) Acute Rehab PT Goals Patient Stated Goal: to go home Progress towards PT goals: Progressing toward goals    Frequency    Min 2X/week      PT Plan Current plan remains appropriate       AM-PAC PT "6 Clicks" Mobility   Outcome Measure  Help needed turning from your back to your side while in a flat bed without using bedrails?: None Help needed moving from lying on your back to sitting on the side of a flat bed without using bedrails?:  None Help needed moving to and from a bed to a chair (including a wheelchair)?: None Help needed standing up from a chair using your arms (e.g., wheelchair or bedside chair)?: A Little Help needed to walk in hospital room?: A Little Help needed climbing 3-5 steps with a railing? : A Little 6 Click Score: 21    End of Session Equipment Utilized During Treatment: Gait belt Activity Tolerance: Patient tolerated treatment well Patient left: in chair;with call bell/phone within reach;with nursing/sitter in  room Nurse Communication: Mobility status PT Visit Diagnosis: Muscle weakness (generalized) (M62.81);Difficulty in walking, not elsewhere classified (R26.2)     Time: 1215-1229 PT Time Calculation (min) (ACUTE ONLY): 14 min  Charges:  $Gait Training: 8-22 mins                     Julaine Fusi PTA 09/12/20, 3:55 PM

## 2020-09-12 NOTE — Progress Notes (Signed)
VSS. Patient looks calm and relaxed

## 2020-09-13 DIAGNOSIS — U071 COVID-19: Secondary | ICD-10-CM | POA: Diagnosis not present

## 2020-09-13 DIAGNOSIS — I482 Chronic atrial fibrillation, unspecified: Secondary | ICD-10-CM | POA: Diagnosis not present

## 2020-09-13 DIAGNOSIS — R778 Other specified abnormalities of plasma proteins: Secondary | ICD-10-CM | POA: Diagnosis not present

## 2020-09-13 DIAGNOSIS — R55 Syncope and collapse: Secondary | ICD-10-CM | POA: Diagnosis not present

## 2020-09-13 LAB — COMPREHENSIVE METABOLIC PANEL
ALT: 20 U/L (ref 0–44)
AST: 27 U/L (ref 15–41)
Albumin: 3 g/dL — ABNORMAL LOW (ref 3.5–5.0)
Alkaline Phosphatase: 52 U/L (ref 38–126)
Anion gap: 7 (ref 5–15)
BUN: 19 mg/dL (ref 8–23)
CO2: 24 mmol/L (ref 22–32)
Calcium: 8.6 mg/dL — ABNORMAL LOW (ref 8.9–10.3)
Chloride: 108 mmol/L (ref 98–111)
Creatinine, Ser: 0.71 mg/dL (ref 0.61–1.24)
GFR, Estimated: >60 mL/min (ref >60)
Glucose, Bld: 100 mg/dL — ABNORMAL HIGH (ref 70–99)
Potassium: 4 mmol/L (ref 3.5–5.1)
Sodium: 139 mmol/L (ref 135–145)
Total Bilirubin: 1.1 mg/dL (ref 0.3–1.2)
Total Protein: 6.4 g/dL — ABNORMAL LOW (ref 6.5–8.1)

## 2020-09-13 LAB — GLUCOSE, CAPILLARY
Glucose-Capillary: 71 mg/dL (ref 70–99)
Glucose-Capillary: 150 mg/dL — ABNORMAL HIGH (ref 70–99)
Glucose-Capillary: 181 mg/dL — ABNORMAL HIGH (ref 70–99)
Glucose-Capillary: 165 mg/dL — ABNORMAL HIGH (ref 70–99)

## 2020-09-13 LAB — CULTURE, BLOOD (ROUTINE X 2)
Special Requests: ADEQUATE
Special Requests: ADEQUATE

## 2020-09-13 LAB — CBC WITH DIFFERENTIAL/PLATELET
Abs Immature Granulocytes: 0.02 10*3/uL (ref 0.00–0.07)
Basophils Absolute: 0 10*3/uL (ref 0.0–0.1)
Basophils Relative: 0 %
Eosinophils Absolute: 0.3 10*3/uL (ref 0.0–0.5)
Eosinophils Relative: 4 %
HCT: 36.6 % — ABNORMAL LOW (ref 39.0–52.0)
Hemoglobin: 12.1 g/dL — ABNORMAL LOW (ref 13.0–17.0)
Immature Granulocytes: 0 %
Lymphocytes Relative: 27 %
Lymphs Abs: 1.9 10*3/uL (ref 0.7–4.0)
MCH: 31.9 pg (ref 26.0–34.0)
MCHC: 33.1 g/dL (ref 30.0–36.0)
MCV: 96.6 fL (ref 80.0–100.0)
Monocytes Absolute: 0.9 10*3/uL (ref 0.1–1.0)
Monocytes Relative: 13 %
Neutro Abs: 3.8 10*3/uL (ref 1.7–7.7)
Neutrophils Relative %: 56 %
Platelets: 246 10*3/uL (ref 150–400)
RBC: 3.79 MIL/uL — ABNORMAL LOW (ref 4.22–5.81)
RDW: 13.2 % (ref 11.5–15.5)
WBC: 6.8 10*3/uL (ref 4.0–10.5)
nRBC: 0 % (ref 0.0–0.2)

## 2020-09-13 LAB — MAGNESIUM: Magnesium: 1.9 mg/dL (ref 1.7–2.4)

## 2020-09-13 LAB — LACTATE DEHYDROGENASE: LDH: 141 U/L (ref 98–192)

## 2020-09-13 LAB — VANCOMYCIN, PEAK: Vancomycin Pk: 18 ug/mL — ABNORMAL LOW (ref 30–40)

## 2020-09-13 LAB — D-DIMER, QUANTITATIVE: D-Dimer, Quant: 1.02 ug/mL-FEU — ABNORMAL HIGH (ref 0.00–0.50)

## 2020-09-13 LAB — PHOSPHORUS: Phosphorus: 3.4 mg/dL (ref 2.5–4.6)

## 2020-09-13 LAB — C-REACTIVE PROTEIN: CRP: 1.3 mg/dL — ABNORMAL HIGH (ref <1.0)

## 2020-09-13 LAB — FERRITIN: Ferritin: 56 ng/mL (ref 24–336)

## 2020-09-13 MED ORDER — SORBITOL 70 % SOLN
960.0000 mL | TOPICAL_OIL | Freq: Once | ORAL | Status: AC
  Administered 2020-09-13: 960 mL via RECTAL
  Filled 2020-09-13: qty 473

## 2020-09-13 NOTE — Care Plan (Signed)
I pulled this patients foley. He went to the bathroom and had a BM. He attempted to void and now there is a moderate amount of blood coming from his penis when he tries to urinate. Please advise.   Message sent to MD Sherral Hammers after pulled patients foley. Upon removal, there was no blood and peri care was provided. Pt up to have a BM and tried to urinate, and blood started to come out of penis. Bm large at this time. Bath given to patient. Linen change. Cleansed penis and blood still noted.

## 2020-09-13 NOTE — Progress Notes (Signed)
PROGRESS NOTE    Mathew Baker  [id number] DOB: 1940-10-06 DOA: 09/09/2020 PCP: Kirk Ruths, MD     Brief Narrative:  80 y.o. WM PMHx HTN, HLD, DM type II with hyperglycemia, stroke, Atrial fibrillation on Eliquis, BPH,   Presents with syncope and shortness of breath.  Patient states that he has been having cough, shortness of breath for more than 2 weeks.  He has chest pressure, denies active chest pain.  No fever or chills.  He has nausea, no vomiting, diarrhea or abdominal pain.  No symptoms of UTI.  No rectal bleeding or dark stool.  Patient states that he had lightheadedness, and passed out in the bathroom. He did did strike his head.  No unilateral numbness or tingling see extremities.  No facial droop or slurred speech. Patient was found to have hypotension with blood pressure 89/41 in ed, which improved to 139/79 later one without treatment.   ED Course: pt was found to have positive Covid Ag test, WBC 6.6, troponin 212, BNP 356, pending urinalysis, electrolytes renal function okay, temperature normal, heart rate 72, RR 15, oxygen saturation 94% on room air, chest x-ray negative.  CT head is negative for acute intracranial abnormalities.  CT of C-spine is negative for acute issues.  Patient is placed on MedSurg bed for observation.   Subjective: 1/8 afebrile overnight    afebrile overnight, A/O x4.   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  Chest x-ray negative for infiltration.  Will not start remdesivir and will steroid.  Will check inflammatory markers. -vitamin C, zinc.  -Bronchodilators -PRN Mucinex for cough -f/u Blood culture -Gentle IV fluid:  -If patient develops oxygen desaturation, Will ask the patient to maintain an awake prone position for 16+ hours a day, if possible, with a minimum of 2-3 hours at a time -Will attempt to maintain euvolemia to a net negative fluid status  Uncontrolled type 2 diabetes mellitus with microalbuminuria ([place]):  -1/5 hemoglobin A1c = 8.0  - Lantus 50 units  BID -Sensitive SSI  HTN (hypertension): -Hold blood pressure medications due to hypotension -IV hydralazine as needed   Atrial fibrillation, chronic:  -Patient is taking Eliquis, not nodal blockers.  Heart rate is 72 -Switch Eliquis to IV heparin due to elevated troponin - 1/6 cardiology signed off   Elevated troponin:  Troponin 212, no chest pain, possibly due to demand ischemia.  Dr. Nehemiah Massed of cardiology is consulted -Per cardiology; Continue serial ECG and enzymes to assess for possible rhythm disturbances causing weakness fatigue or syncope and/or possibility of myocardial infarction  Hypotension:  -Blood pressure 89/41, which improved to 139/79.  Etiology is not  clear.  Patient does not seem to have sepsis clinically.  Possibly due to dehydration and continuation of Zestoretic -IV fluid: 500 cc normal saline -Monitor blood pressure closely -Hold Zestoretic -Resolved  Syncope:  No focal neuro deficit on physical examination.  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  CT of C-spine is negative.  Possibly due to hypotension.  Patient is on Eliquis, low suspicions  for PE.  Will check D-dimer.  -PT; recommends home health PT -Treat hypotension  HLD (hyperlipidemia) -Lipitor  Acute urinary retention/UTI? -1/5 urine culture negative -Bladder scan, performed in and out catheter and send urine for culture.   -Flomax 0.4 mg daily -1/8 discontinue Foley.  Voiding trial  -1/8 developed frank hematuria, will DC apixaban -1/8 spoke with Dr.Stoioff urology who will see patient requested that 20 French Foley be placed at bedside  Positive Staph epidermidis bacteremia -4/4 bottles positive for MRSE.  Would consider this a true infection -Currently patient not spiking fevers, negative leukocytosis, will reorder cultures. -Obtain procalcitonin -1/6 vancomycin empirically started -1/7 consult ID - 1/7 echocardiogram pending -1/7 per ID may also require TEE secondary to patient having loop recorder which will have to be removed.  Consult cardiology in a.m. - 1/7 per ID will require 4 to 6 weeks of IV antibiotics  -1/8 sent chat to [facility] requesting that patient be seen tomorrow.  Will await recommendations  Constipation - 1/8 smog enema     DVT prophylaxis: Apixaban; 1/8 hold secondary to frank hematuria Code Status: Full Family Communication:  Status is: Inpatient    Dispo: The patient is from: Home              Anticipated d/c is to: Home;               Anticipated d/c date is: 1/10              Patient currently stable      Consultants:  Dr. Nehemiah Massed of cardiology [facility]  Procedures/Significant Events:    I have personally reviewed and interpreted all radiology studies and my findings are as above.  VENTILATOR SETTINGS: Room air 1/8 SPO2 96%   Cultures 1/4 blood RIGHT AC positive staph epidermidis (MRSE): 1/4 blood LEFT hand positive staph epidermidis (MRSE) 1/5.  Urine negative final 1/5 blood RIGHT hand NGTD 1/5 blood RIGHT AC NGTD   Antimicrobials: Anti-infectives (From  admission, onward)   Start     Ordered Stop   09/12/20 0600  vancomycin (VANCOCIN) IVPB 1000 mg/200 mL premix        09/11/20 1332     09/11/20 1400  vancomycin (VANCOREADY) IVPB 2000 mg/400 mL        09/11/20 1311 09/11/20 1613   09/10/20 1000  remdesivir 100 mg in sodium chloride 0.9 % 100 mL IVPB  Status:  Discontinued       "Followed by" Linked Group Details   09/09/20 1545 09/09/20 1913   09/09/20 1645  remdesivir 200 mg in sodium chloride 0.9% 250 mL IVPB  Status:  Discontinued       "Followed by" Linked Group Details   09/09/20 1545 09/09/20 1913      Devices    LINES / TUBES:      Continuous Infusions: . vancomycin 1,000 mg (09/13/20 0502)     Objective: Vitals:   09/12/20 2022 09/12/20 2340 09/13/20 0457 09/13/20 0821  BP: 133/72 122/70 130/63 131/77  Pulse: (!) 59 60 (!)  58 61  Resp: 20 18 18 18   Temp: 98.5 F (36.9 C) 98.5 F (36.9 C) 98.2 F (36.8 C) 97.7 F (36.5 C)  TempSrc: Oral Oral  Axillary  SpO2: 98% 97% 95% 96%  Weight:      Height:        Intake/Output Summary (Last 24 hours) at 09/13/2020 0830 Last data filed at 09/13/2020 0502 Gross per 24 hour  Intake 1440 ml  Output 1200 ml  Net 240 ml   Filed Weights   09/09/20 1839  Weight: 104.8 kg    Examination:  General: A/O x4, No acute respiratory distress Eyes: negative scleral hemorrhage, negative anisocoria, negative icterus ENT: Negative Runny nose, negative gingival bleeding, Neck:  Negative scars, masses, torticollis, lymphadenopathy, JVD Lungs: Clear to auscultation bilaterally without wheezes or crackles Cardiovascular: Regular rate and rhythm without murmur gallop or rub normal S1 and S2 Abdomen: negative abdominal pain, nondistended, positive soft, bowel sounds, no rebound, no ascites, no appreciable mass Extremities: No significant cyanosis, clubbing, or edema bilateral lower extremities Skin: Negative rashes, lesions, ulcers Psychiatric:  Negative depression, negative  anxiety, negative fatigue, negative mania  Central nervous system:  Cranial nerves II through XII intact, tongue/uvula midline, all extremities muscle strength 5/5, sensation intact throughout, negative dysarthria, negative expressive aphasia, negative receptive aphasia.  .     Data Reviewed: Care during the described time interval was provided by me .  I have reviewed this patient's available data, including medical history, events of note, physical examination, and all test results as part of my evaluation.  CBC: Recent Labs  Lab 09/09/20 1144 09/10/20 0431 09/11/20 0555 09/12/20 1012 09/13/20 0456  WBC 6.6 5.2 6.2 6.4 6.8  NEUTROABS  --  2.0 3.4 4.0 3.8  HGB 14.0 12.7* 12.0* 12.4* 12.1*  HCT 44.5 37.4* 36.8* 37.2* 36.6*  MCV 100.9* 96.6 97.1 96.9 96.6  PLT 191 211 226 226 0000000   Basic Metabolic Panel: Recent Labs  Lab 09/09/20 1144 09/10/20 0431 09/11/20 0555 09/12/20 0418 09/12/20 1012 09/13/20 0456  NA 137 141 139 141  --  139  K 5.1 3.9 3.8 4.0  --  4.0  CL 105 109 105 108  --  108  CO2 20* 25 25 22   --  24  GLUCOSE 130* 139* 119* 78  --  100*  BUN 16 21 19 16   --  19  CREATININE 1.06 0.99 0.68 0.69  --  0.71  CALCIUM 8.5* 8.2* 8.3* 8.3*  --  8.6*  MG  --  1.8 1.9  --  1.8 1.9  PHOS  --   --  2.9  --  2.9 3.4   GFR: Estimated Creatinine Clearance: 93.6 mL/min (by C-G formula based on SCr of 0.71 mg/dL). Liver Function Tests: Recent Labs  Lab 09/10/20 0431 09/11/20 0555 09/12/20 0418 09/13/20 0456  AST 27 21 21 27   ALT 14 14 14 20   ALKPHOS 49 43 46 52  BILITOT 1.1 1.1 0.9 1.1  PROT 6.4* 6.3* 5.7* 6.4*  ALBUMIN 3.0* 3.0* 2.8* 3.0*   No results for input(s): LIPASE, AMYLASE in the last 168 hours. No results for input(s): AMMONIA in the last 168 hours. Coagulation Profile: No results for input(s): INR, PROTIME in the last 168 hours. Cardiac Enzymes: No results for input(s): CKTOTAL, CKMB, CKMBINDEX, TROPONINI in the last 168 hours. BNP (last 3  results) No results for input(s): PROBNP in the last 8760 hours. HbA1C: No results for input(s): HGBA1C in the last 72 hours.  CBG: Recent Labs  Lab 09/12/20 0821 09/12/20 1200 09/12/20 1733 09/12/20 2054 09/13/20 0744  GLUCAP 93 117* 118* 176* 71   Lipid Profile: No results for input(s): CHOL, HDL, LDLCALC, TRIG, CHOLHDL, LDLDIRECT in the last 72 hours. Thyroid Function Tests: No results for input(s): TSH, T4TOTAL, FREET4, T3FREE, THYROIDAB in the last 72 hours. Anemia Panel: Recent Labs    09/12/20 0418 09/13/20 0456  FERRITIN 61 56   Sepsis Labs: Recent Labs  Lab 09/09/20 1758 09/10/20 2238 09/11/20 0555  PROCALCITON <0.10 <0.10 <0.10    Recent Results (from the past 240 hour(s))  Culture, blood (Routine X 2) w Reflex to ID Panel     Status: Abnormal   Collection Time: 09/09/20  5:58 PM   Specimen: BLOOD  Result Value Ref Range Status   Specimen Description   Final    BLOOD BLOOD LEFT HAND Performed at [facility], [address] [place], [place] [address]    Special Requests   Final    BOTTLES DRAWN AEROBIC AND ANAEROBIC Blood Culture adequate volume Performed at [facility], [address], [place], [place] [address]    Culture  Setup Time   Final    GRAM POSITIVE COCCI IN BOTH AEROBIC AND ANAEROBIC BOTTLES CRITICAL VALUE NOTED.  VALUE IS CONSISTENT WITH PREVIOUSLY REPORTED AND CALLED VALUE. Performed at [facility], [address] [place], [place] [address]    Culture (A)  Final    STAPHYLOCOCCUS EPIDERMIDIS SUSCEPTIBILITIES PERFORMED ON PREVIOUS CULTURE WITHIN THE LAST 5 DAYS. Performed at [facility], [place] [address], [place], [place] [address]    Report Status 09/13/2020 FINAL  Final  Culture, blood (Routine X 2) w Reflex to ID Panel     Status: Abnormal (Preliminary result)   Collection Time: 09/09/20  5:58 PM   Specimen: BLOOD  Result Value Ref Range Status   Specimen Description   Final    BLOOD RIGHT  ANTECUBITAL Performed at [facility], [address], [place], [place] [address]    Special Requests   Final    BOTTLES DRAWN AEROBIC AND ANAEROBIC Blood Culture adequate volume Performed at [facility], [address] [place], [place] [address]    Culture  Setup Time   Final    GRAM POSITIVE COCCI IN BOTH AEROBIC AND ANAEROBIC BOTTLES Organism ID to follow CRITICAL RESULT CALLED TO, READ BACK BY AND VERIFIED WITH: Sander Radon 1644 09/10/2020 DB Performed at [facility], [address], [place], [place] [address]    Culture (A)  Final    STAPHYLOCOCCUS EPIDERMIDIS SUSCEPTIBILITIES TO FOLLOW Performed at [facility], [place] [address] [place], [place] [address]    Report Status PENDING  Incomplete  Blood Culture ID Panel (Reflexed)     Status: Abnormal   Collection Time: 09/09/20  5:58 PM  Result Value Ref Range Status   Enterococcus faecalis NOT DETECTED NOT DETECTED Final   Enterococcus Faecium NOT DETECTED NOT DETECTED Final   Listeria monocytogenes NOT DETECTED NOT DETECTED Final   Staphylococcus species DETECTED (A) NOT DETECTED Final    Comment: CRITICAL RESULT CALLED TO, READ BACK BY AND VERIFIED WITH: Sander Radon 1644 09/10/2020 DB    Staphylococcus aureus (BCID) NOT DETECTED NOT DETECTED Final   Staphylococcus epidermidis DETECTED (A) NOT DETECTED Final    Comment: Methicillin (oxacillin) resistant coagulase negative staphylococcus. Possible blood culture contaminant (unless isolated from more than one blood culture draw or clinical case suggests pathogenicity).  No antibiotic treatment is indicated for blood  culture contaminants. CRITICAL RESULT CALLED TO, READ BACK BY AND VERIFIED WITH: BRANDON BEERS, PHAR 1644 09/10/2020 DB    Staphylococcus lugdunensis NOT DETECTED NOT DETECTED Final   Streptococcus species NOT DETECTED NOT DETECTED Final   Streptococcus agalactiae NOT DETECTED NOT DETECTED Final   Streptococcus  pneumoniae NOT DETECTED NOT DETECTED Final   Streptococcus pyogenes NOT DETECTED NOT DETECTED Final   A.calcoaceticus-baumannii NOT DETECTED NOT DETECTED Final   Bacteroides fragilis NOT DETECTED NOT DETECTED Final   Enterobacterales NOT DETECTED NOT DETECTED Final   Enterobacter cloacae complex NOT DETECTED NOT DETECTED Final   Escherichia coli NOT DETECTED NOT DETECTED Final   Klebsiella aerogenes NOT DETECTED NOT DETECTED Final   Klebsiella oxytoca NOT DETECTED NOT DETECTED Final   Klebsiella pneumoniae NOT DETECTED NOT DETECTED Final   Proteus species NOT DETECTED NOT DETECTED Final   Salmonella species NOT DETECTED NOT DETECTED Final   Serratia marcescens NOT DETECTED NOT DETECTED Final   Haemophilus influenzae NOT DETECTED NOT DETECTED Final   Neisseria meningitidis NOT DETECTED NOT DETECTED Final   Pseudomonas aeruginosa NOT DETECTED NOT DETECTED Final   Stenotrophomonas maltophilia NOT DETECTED NOT DETECTED Final   Candida albicans NOT DETECTED NOT DETECTED Final   Candida auris NOT DETECTED NOT DETECTED Final   Candida glabrata NOT DETECTED NOT DETECTED Final   Candida krusei NOT DETECTED NOT DETECTED Final   Candida parapsilosis NOT DETECTED NOT DETECTED Final   Candida tropicalis NOT DETECTED NOT DETECTED Final   Cryptococcus neoformans/gattii NOT DETECTED NOT DETECTED Final   Methicillin resistance mecA/C DETECTED (A) NOT DETECTED Final    Comment: CRITICAL RESULT CALLED TO, READ BACK BY AND VERIFIED WITH: Sander Radon 1644 09/10/2020 DB Performed at [facility], [address], [place], [place] [address]   Urine Culture     Status: None   Collection Time: 09/10/20 10:43 AM   Specimen: Urine, Random  Result Value Ref Range Status   Specimen Description   Final    URINE, RANDOM Performed at [facility], [address] [place], [place] [address]    Special Requests   Final    NONE Performed at [facility], [address]  [place], [place] [address]    Culture   Final    NO GROWTH Performed at [facility], [address] [place], [place] [address]    Report Status 09/12/2020 FINAL  Final  CULTURE, BLOOD (ROUTINE X 2) w Reflex to ID Panel     Status: None (Preliminary result)   Collection Time: 09/10/20  7:02 PM   Specimen: BLOOD  Result Value Ref Range Status   Specimen Description BLOOD BLOOD RIGHT HAND  Final   Special Requests   Final    BOTTLES DRAWN AEROBIC AND ANAEROBIC Blood Culture adequate volume   Culture   Final    NO GROWTH 3 DAYS Performed at [facility], [address], [place], [place] [address]    Report Status PENDING  Incomplete  CULTURE, BLOOD (ROUTINE X 2) w Reflex to ID Panel     Status: None (Preliminary result)   Collection Time: 09/10/20  7:04 PM   Specimen: BLOOD  Result Value Ref Range Status   Specimen Description BLOOD RIGHT ANTECUBITAL  Final   Special Requests   Final    BOTTLES DRAWN AEROBIC AND ANAEROBIC Blood Culture adequate volume   Culture   Final    NO GROWTH 3 DAYS  Performed at [facility], [address], [place], [place] [address]    Report Status PENDING  Incomplete         Radiology Studies: No results found.      Scheduled Meds: . apixaban  5 mg Oral BID  . vitamin C  500 mg Oral Daily  . aspirin EC  81 mg Oral QHS  . atorvastatin  80 mg Oral Daily  . Chlorhexidine Gluconate Cloth  6 each Topical Daily  . insulin aspart  0-5 Units Subcutaneous QHS  . insulin aspart  0-9 Units Subcutaneous TID WC  . insulin glargine  50 Units Subcutaneous BID  . ipratropium  2 puff Inhalation Q6H  . latanoprost  1 drop Both Eyes QHS  . tamsulosin  0.4 mg Oral QPC breakfast  . vitamin B-12  100 mcg Oral Daily  . zinc sulfate  220 mg Oral Daily   Continuous Infusions: . vancomycin 1,000 mg (09/13/20 0502)     LOS: 3 days    Time spent:40 min    Cherlyn Syring, Geraldo Docker, MD [facility] Pager [phone]  If 7PM-7AM,  please contact night-coverage [email or web address] Password [email or web address] 09/13/2020, 8:30 AM

## 2020-09-13 NOTE — Consult Note (Signed)
Urology Consult  Requesting physician: Dia Crawford, MD  Reason for consultation: Urethral bleeding  Chief Complaint: Penile bleeding  History of Present Illness: Mathew Baker is a 80 y.o. male admitted 09/09/2020 with a syncopal episode and history of shortness of breath and cough for 1 week.  Was found to be Covid + and staph epidermidis bacteremia and currently undergoing evaluation for the etiology.  Urine culture was negative.  History of BPH on tamsulosin as an outpatient.  Develop urinary retention within the first 24 hours of admission.  Bladder scan >752 mL and a Foley catheter was placed.  Apparently off tamsulosin at the time of admission which was restarted.  On Eliquis for chronic atrial fibrillation and his Foley catheter was removed's morning.  When he attempted to void he was noted to have blood per urethra which resolved in ~1 hour.  He subsequently voided 250 mL of clear urine and has no sensation of incomplete emptying.  No previous history of urinary retention, urethral bleeding or hematuria   Past Medical History:  Diagnosis Date  . Chronic airway obstruction ([place])   . Degeneration of intervertebral disc of lumbar region   . Diabetes mellitus without complication ([place])   . Essential hypertension, benign   . Hyperlipidemia   . Hypertension   . Osteoarthritis of lower extremity   . Pneumonia, organism unspecified([place])   . PSA elevation 07/16/2013   Normal  . Stroke [facility])     Past Surgical History:  Procedure Laterality Date  . CATARACT EXTRACTION W/ INTRAOCULAR LENS  IMPLANT, BILATERAL    . COLONOSCOPY  2006   Normal: Repeat in 10 yrs  . LOOP RECORDER INSERTION N/A 02/21/2020   Procedure: LOOP RECORDER INSERTION;  Surgeon: Isaias Cowman, MD;  Location: [facility];  Service: Cardiovascular;  Laterality: N/A;    Home Medications:  Current Meds  Medication Sig  . aspirin EC 81 MG tablet Take 81 mg by mouth at bedtime. Swallow whole.   Marland Kitchen atorvastatin (LIPITOR) 80 MG tablet Take 80 mg by mouth daily.  Marland Kitchen ELIQUIS 5 MG TABS tablet Take 5 mg by mouth every 12 (twelve) hours.  . insulin aspart (NOVOLOG) 100 UNIT/ML injection Inject 0-9 Units into the skin 3 (three) times daily with meals. (Patient taking differently: Inject 0-6 Units into the skin 3 (three) times daily as needed for high blood sugar.)  . insulin glargine (LANTUS) 100 UNIT/ML injection Inject 70 Units into the skin 2 (two) times daily.   Marland Kitchen latanoprost (XALATAN) 0.005 % ophthalmic solution Place 1 drop into both eyes at bedtime.  Marland Kitchen lisinopril-hydrochlorothiazide (ZESTORETIC) 10-12.5 MG tablet Take 1 tablet by mouth daily.  . tamsulosin (FLOMAX) 0.4 MG CAPS capsule Take 0.4 mg by mouth at bedtime.  . vitamin B-12 (CYANOCOBALAMIN) 100 MCG tablet Take 100 mcg by mouth daily.    Allergies: No Known Allergies  Family History  Problem Relation Age of Onset  . Diabetes Mother     Social History:  reports that he quit smoking about 62 years ago. He has never used smokeless tobacco. He reports that he does not drink alcohol and does not use drugs.  ROS: Otherwise noncontributory except as per the HPI  Physical Exam:  Vital signs in last 24 hours: Temp:  [97.7 F (36.5 C)-99 F (37.2 C)] 97.9 F (36.6 C) (01/08 1147) Pulse Rate:  [58-65] 65 (01/08 1147) Resp:  [18-20] 20 (01/08 1147) BP: (122-133)/(62-77) 124/70 (01/08 1147) SpO2:  [95 %-100 %] 100 % (  01/08 1147) Constitutional:  Alert and oriented, No acute distress    Impression/Assessment:   80 y.o. male with urethral bleeding after Foley catheter removal.  Presentation consistent with prostatic bleeding which has resolved and he has subsequently voided clear urine   Recommendation:   Should he develop recurrent urinary retention would place a 16 French coud catheter   09/13/2020, 1:31 PM  John Giovanni,  MD

## 2020-09-14 ENCOUNTER — Inpatient Hospital Stay
Admit: 2020-09-14 | Discharge: 2020-09-14 | Disposition: A | Discharge status: Home / Self Care | Payer: Medicare Other | Attending: Infectious Diseases | Admitting: Infectious Diseases

## 2020-09-14 ENCOUNTER — Inpatient Hospital Stay: Admit: 2020-09-14 | Payer: Medicare Other

## 2020-09-14 DIAGNOSIS — R55 Syncope and collapse: Secondary | ICD-10-CM | POA: Diagnosis not present

## 2020-09-14 DIAGNOSIS — U071 COVID-19: Secondary | ICD-10-CM | POA: Diagnosis not present

## 2020-09-14 DIAGNOSIS — I482 Chronic atrial fibrillation, unspecified: Secondary | ICD-10-CM | POA: Diagnosis not present

## 2020-09-14 DIAGNOSIS — R778 Other specified abnormalities of plasma proteins: Secondary | ICD-10-CM | POA: Diagnosis not present

## 2020-09-14 LAB — GLUCOSE, CAPILLARY
Glucose-Capillary: 173 mg/dL — ABNORMAL HIGH (ref 70–99)
Glucose-Capillary: 232 mg/dL — ABNORMAL HIGH (ref 70–99)
Glucose-Capillary: 167 mg/dL — ABNORMAL HIGH (ref 70–99)
Glucose-Capillary: 139 mg/dL — ABNORMAL HIGH (ref 70–99)

## 2020-09-14 LAB — COMPREHENSIVE METABOLIC PANEL WITH GFR
ALT: 22 U/L (ref 0–44)
AST: 27 U/L (ref 15–41)
Albumin: 2.9 g/dL — ABNORMAL LOW (ref 3.5–5.0)
Alkaline Phosphatase: 64 U/L (ref 38–126)
Anion gap: 8 (ref 5–15)
BUN: 17 mg/dL (ref 8–23)
CO2: 25 mmol/L (ref 22–32)
Calcium: 8.6 mg/dL — ABNORMAL LOW (ref 8.9–10.3)
Chloride: 106 mmol/L (ref 98–111)
Creatinine, Ser: 0.7 mg/dL (ref 0.61–1.24)
GFR, Estimated: >60 mL/min
Glucose, Bld: 165 mg/dL — ABNORMAL HIGH (ref 70–99)
Potassium: 4.2 mmol/L (ref 3.5–5.1)
Sodium: 139 mmol/L (ref 135–145)
Total Bilirubin: 1 mg/dL (ref 0.3–1.2)
Total Protein: 6.3 g/dL — ABNORMAL LOW (ref 6.5–8.1)

## 2020-09-14 LAB — D-DIMER, QUANTITATIVE: D-Dimer, Quant: 0.99 ug{FEU}/mL — ABNORMAL HIGH (ref 0.00–0.50)

## 2020-09-14 LAB — CBC WITH DIFFERENTIAL/PLATELET
Abs Immature Granulocytes: 0.02 K/uL (ref 0.00–0.07)
Basophils Absolute: 0 K/uL (ref 0.0–0.1)
Basophils Relative: 1 %
Eosinophils Absolute: 0.3 K/uL (ref 0.0–0.5)
Eosinophils Relative: 5 %
HCT: 35.6 % — ABNORMAL LOW (ref 39.0–52.0)
Hemoglobin: 11.7 g/dL — ABNORMAL LOW (ref 13.0–17.0)
Immature Granulocytes: 0 %
Lymphocytes Relative: 29 %
Lymphs Abs: 1.7 K/uL (ref 0.7–4.0)
MCH: 31.8 pg (ref 26.0–34.0)
MCHC: 32.9 g/dL (ref 30.0–36.0)
MCV: 96.7 fL (ref 80.0–100.0)
Monocytes Absolute: 0.8 K/uL (ref 0.1–1.0)
Monocytes Relative: 13 %
Neutro Abs: 3.1 K/uL (ref 1.7–7.7)
Neutrophils Relative %: 52 %
Platelets: 252 K/uL (ref 150–400)
RBC: 3.68 MIL/uL — ABNORMAL LOW (ref 4.22–5.81)
RDW: 13.2 % (ref 11.5–15.5)
WBC: 5.9 K/uL (ref 4.0–10.5)
nRBC: 0 % (ref 0.0–0.2)

## 2020-09-14 LAB — VANCOMYCIN, TROUGH: Vancomycin Tr: 11 ug/mL — ABNORMAL LOW (ref 15–20)

## 2020-09-14 LAB — FERRITIN: Ferritin: 48 ng/mL (ref 24–336)

## 2020-09-14 LAB — LACTATE DEHYDROGENASE: LDH: 142 U/L (ref 98–192)

## 2020-09-14 LAB — PHOSPHORUS: Phosphorus: 3.4 mg/dL (ref 2.5–4.6)

## 2020-09-14 LAB — C-REACTIVE PROTEIN: CRP: 1 mg/dL — ABNORMAL HIGH (ref <1.0)

## 2020-09-14 LAB — MAGNESIUM: Magnesium: 1.9 mg/dL (ref 1.7–2.4)

## 2020-09-14 MED ORDER — POLYETHYLENE GLYCOL 3350 17 G PO PACK
17.0000 g | PACK | Freq: Once | ORAL | Status: AC
  Administered 2020-09-14: 17 g via ORAL

## 2020-09-14 MED ORDER — VANCOMYCIN HCL 1250 MG/250ML IV SOLN
1250.0000 mg | Freq: Two times a day (BID) | INTRAVENOUS | Status: DC
  Administered 2020-09-14 – 2020-09-16 (×4): 1250 mg via INTRAVENOUS
  Filled 2020-09-14 (×5): qty 250

## 2020-09-14 NOTE — Progress Notes (Signed)
Pharmacy Antibiotic Note  Mathew Baker is a 80 y.o. male admitted on 09/09/2020 with  hypotension dizziness weakness cough congestion most consistent with Covid infection and has subsequently grown out S epidermidis with methicillin resistance in his blood.  Pharmacy was consulted for vancomycin dosing. His renal function has been stable since admission and at apparent baseline  Plan:  Pt is currently on vancomycin 1000 mg q12H. Vancomycin peak returned at 18 (~ 3 hours post dose), and 11 (~ 12 hours post dose). Calculated AUC is 366. T1/2 11.5. Goal AUC 400-550. Plan to adjust vancomycin dose to 1250 mg q12H for a Calculated AUC of 457 and Css min 13.6. Monitor renal function.   Height: 6\' 1"  (185.4 cm) Weight: 104.8 kg (231 lb) IBW/kg (Calculated) : 79.9  Temp (24hrs), Avg:97.8 F (36.6 C), Min:97.7 F (36.5 C), Max:97.9 F (36.6 C)  Recent Labs  Lab 09/10/20 0431 09/11/20 0555 09/12/20 0418 09/12/20 1012 09/13/20 0456 09/13/20 2106 09/14/20 0516  WBC 5.2 6.2  --  6.4 6.8  --  5.9  CREATININE 0.99 0.68 0.69  --  0.71  --  0.70  VANCOTROUGH  --   --   --   --   --   --  11*  VANCOPEAK  --   --   --   --   --  18*  --     Estimated Creatinine Clearance: 93.6 mL/min (by C-G formula based on SCr of 0.7 mg/dL).    No Known Allergies  Antimicrobials this admission: vancomycin 1/6 >>   Microbiology results: 01/04 BCx: 4/4 S epidermidis (Mec-A positive) 01/05 NCx: NG x 2 days 01/05 UCx: NG final  Thank you for allowing pharmacy to be a part of this patient's care.  Oswald Hillock 09/14/2020 7:13 AM

## 2020-09-14 NOTE — Progress Notes (Signed)
[facility] Cardiology  SUBJECTIVE: Patient laying comfortably in bed, denies chest pain or shortness of breath, wishes to go home soon   Vitals:   09/13/20 1147 09/13/20 2117 09/14/20 0552 09/14/20 0921  BP: 124/70 139/74 132/78 137/65  Pulse: 65 (!) 59 60 62  Resp: 20   18  Temp: 97.9 F (36.6 C) 97.8 F (36.6 C) 97.9 F (36.6 C) 98 F (36.7 C)  TempSrc: Oral Oral  Oral  SpO2: 100% 97% 98% 98%  Weight:      Height:         Intake/Output Summary (Last 24 hours) at 09/14/2020 1144 Last data filed at 09/14/2020 0630 Gross per 24 hour  Intake 400 ml  Output 950 ml  Net -550 ml      PHYSICAL EXAM  General: Well developed, well nourished, in no acute distress HEENT:  Normocephalic and atramatic Neck:  No JVD.  Lungs: Clear bilaterally to auscultation and percussion. Heart: HRRR . Normal S1 and S2 without gallops or murmurs.  Abdomen: Bowel sounds are positive, abdomen soft and non-tender  Msk:  Back normal, normal gait. Normal strength and tone for age. Extremities: No clubbing, cyanosis or edema.   Neuro: Alert and oriented X 3. Psych:  Good affect, responds appropriately   LABS: Basic Metabolic Panel: Recent Labs    09/13/20 0456 09/14/20 0516  NA 139 139  K 4.0 4.2  CL 108 106  CO2 24 25  GLUCOSE 100* 165*  BUN 19 17  CREATININE 0.71 0.70  CALCIUM 8.6* 8.6*  MG 1.9 1.9  PHOS 3.4 3.4   Liver Function Tests: Recent Labs    09/13/20 0456 09/14/20 0516  AST 27 27  ALT 20 22  ALKPHOS 52 64  BILITOT 1.1 1.0  PROT 6.4* 6.3*  ALBUMIN 3.0* 2.9*   No results for input(s): LIPASE, AMYLASE in the last 72 hours. CBC: Recent Labs    09/13/20 0456 09/14/20 0516  WBC 6.8 5.9  NEUTROABS 3.8 3.1  HGB 12.1* 11.7*  HCT 36.6* 35.6*  MCV 96.6 96.7  PLT 246 252   Cardiac Enzymes: No results for input(s): CKTOTAL, CKMB, CKMBINDEX, TROPONINI in the last 72 hours. BNP: Invalid input(s): POCBNP D-Dimer: Recent Labs    09/13/20 0456 09/14/20 0516  DDIMER 1.02*  0.99*   Hemoglobin A1C: No results for input(s): HGBA1C in the last 72 hours. Fasting Lipid Panel: No results for input(s): CHOL, HDL, LDLCALC, TRIG, CHOLHDL, LDLDIRECT in the last 72 hours. Thyroid Function Tests: No results for input(s): TSH, T4TOTAL, T3FREE, THYROIDAB in the last 72 hours.  Invalid input(s): FREET3 Anemia Panel: Recent Labs    09/14/20 0516  FERRITIN 48    No results found.   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  Paroxysmal atrial fibrillation, in sinus rhythm, on Eliquis for stroke prevention, currently on hold 2.  Elevated troponin (8, 212, 264, 250), no chest pain, nondiagnostic ECG, felt to be due to demand supply ischemia in the setting of COVID-19 illness, and bacteremia 3.  Syncope, felt to be due to hypotension, no recurrence, head CT negative 4.  COVID-19 illness 5.  Staph epidermidis bacteremia on IV vancomycin, awaiting  TEE, possible loop recorder removal pending TEE results  Recommendations  1.  Agree with current therapy 2.  Resume Eliquis after TEE 3.  TEE either 09/15/2020 or 09/16/2020 per Dr. Ubaldo Glassing 4.  Further recommendations pending TEE results   Isaias Cowman, MD, PhD, [facility] 09/14/2020 11:44 AM

## 2020-09-14 NOTE — Progress Notes (Addendum)
PROGRESS NOTE    Bernell Sekhon  [id number] DOB: 1941-03-13 DOA: 09/09/2020 PCP: Kirk Ruths, MD     Brief Narrative:  80 y.o. WM PMHx HTN, HLD, DM type II with hyperglycemia, stroke, Atrial fibrillation on Eliquis, BPH,   Presents with syncope and shortness of breath.  Patient states that he has been having cough, shortness of breath for more than 2 weeks.  He has chest pressure, denies active chest pain.  No fever or chills.  He has nausea, no vomiting, diarrhea or abdominal pain.  No symptoms of UTI.  No rectal bleeding or dark stool.  Patient states that he had lightheadedness, and passed out in the bathroom. He did did strike his head.  No unilateral numbness or tingling see extremities.  No facial droop or slurred speech. Patient was found to have hypotension with blood pressure 89/41 in ed, which improved to 139/79 later one without treatment.   ED Course: pt was found to have positive Covid Ag test, WBC 6.6, troponin 212, BNP 356, pending urinalysis, electrolytes renal function okay, temperature normal, heart rate 72, RR 15, oxygen saturation 94% on room air, chest x-ray negative.  CT head is negative for acute intracranial abnormalities.  CT of C-spine is negative for acute issues.  Patient is placed on MedSurg bed for observation.   Subjective: 1/9 afebrile overnight A/O x4, patient sitting comfortably in the bed.  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  Chest x-ray negative for infiltration.  Will not start remdesivir and will steroid.  Will check inflammatory markers. -vitamin C, zinc.  -Bronchodilators -PRN Mucinex for cough -Gentle IV fluid:  -If patient develops oxygen desaturation, Will ask the patient to maintain an awake prone position for 16+ hours a day, if possible, with a minimum of 2-3 hours at a time -Will attempt to maintain euvolemia to a net negative fluid status  Uncontrolled type 2 diabetes mellitus with microalbuminuria ([place]):  -1/5 hemoglobin A1c = 8.0  - Lantus 50 units  BID -Sensitive SSI  HTN (hypertension): -Hold blood pressure medications due to hypotension -IV hydralazine as needed  Atrial fibrillation, chronic:  -Patient is taking Eliquis, not nodal blockers.  Heart rate is 72 -Switch Eliquis to IV heparin due to elevated troponin - 1/6 cardiology signed off -1/8 Apixaban (held) frank hematuria.  Hold until after TEE -1/9 NSR  Elevated troponin:  Troponin 212, no chest pain, possibly due to demand ischemia.  Dr. Nehemiah Massed of cardiology is consulted -Per cardiology; Continue serial ECG and enzymes to assess for possible rhythm  disturbances causing weakness fatigue or syncope and/or possibility of myocardial infarction  Hypotension:  -Blood pressure 89/41, which improved to 139/79.  Etiology is not clear.  Patient does not seem to have sepsis clinically.    Most likely secondary dehydration and continuation of Zestoretic -IV fluid: 500 cc normal saline -Monitor blood pressure closely -Hold Zestoretic -Resolved  Syncope:  No focal neuro deficit on physical  examination.  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  CT of C-spine is negative.  Possibly due to hypotension.  Patient is on Eliquis, low suspicions for PE.  Will check D-dimer.  -PT; recommends home health PT -Treat hypotension  HLD (hyperlipidemia) -Lipitor  Acute urinary retention/UTI? -1/5 urine culture negative -Bladder scan, performed in and out catheter and send urine for culture.   -Flomax 0.4 mg daily -1/8 discontinue Foley.  Voiding trial  -1/8 developed frank hematuria, will DC apixaban -1/8 spoke with Dr.Stoioff urology who will see patient requested that 20 French Foley be placed at bedside  Positive Staph epidermidis bacteremia -4/4 bottles positive for MRSE.  Would consider this a true infection -Currently patient not spiking fevers, negative leukocytosis, will reorder cultures. -Obtain procalcitonin -1/6 vancomycin empirically started -1/7 consult ID - 1/7 echocardiogram pending -1/7 per ID may also require TEE secondary to patient having loop recorder which will have to be removed.  Consult cardiology in a.m. - 1/7 per ID will require 4 to 6 weeks of IV antibiotics  -1/8 sent chat to [place] cardiology requesting that patient be seen tomorrow.  Will await recommendations  Constipation - 1/8 smog enema     DVT prophylaxis: Apixaban; 1/8 hold secondary to frank hematuria Code Status: Full Family Communication: 1/9 called Sharyn Lull (wife) counseled on plan of care answered all questions Status is: Inpatient    Dispo: The patient is from: Home              Anticipated d/c is to: Home;               Anticipated d/c date is: 1/10              Patient currently stable      Consultants:  Dr. Nehemiah Massed of cardiology [facility] [facility]  Procedures/Significant Events:    I have personally reviewed and interpreted all radiology studies and my findings are as above.  VENTILATOR SETTINGS: Room  air 1/9 SPO2 98%   Cultures 1/4 blood RIGHT AC positive staph epidermidis (MRSE): 1/4 blood LEFT hand positive staph epidermidis (MRSE) 1/5.  Urine negative final 1/5 blood RIGHT hand NGTD 1/5 blood RIGHT AC NGTD   Antimicrobials: Anti-infectives (From admission, onward)   Start     Ordered Stop   09/12/20 0600  vancomycin (VANCOCIN) IVPB 1000 mg/200 mL premix        09/11/20 1332     09/11/20 1400  vancomycin (VANCOREADY) IVPB 2000 mg/400 mL        09/11/20 1311 09/11/20 1613   09/10/20 1000  remdesivir 100 mg in sodium chloride 0.9 % 100 mL IVPB  Status:  Discontinued       "Followed by" Linked Group Details   09/09/20 1545 09/09/20 1913   09/09/20 1645  remdesivir 200 mg in sodium chloride 0.9% 250 mL IVPB  Status:  Discontinued       "Followed by" Linked Group Details   09/09/20 1545 09/09/20 1913      Devices    LINES / TUBES:  Continuous Infusions: . vancomycin       Objective: Vitals:   09/13/20 1147 09/13/20 2117 09/14/20 0552 09/14/20 0921  BP: 124/70 139/74 132/78 137/65  Pulse: 65 (!) 59 60 62  Resp: 20   18  Temp: 97.9 F (36.6 C) 97.8 F (36.6 C) 97.9 F (36.6 C) 98 F (36.7 C)  TempSrc: Oral Oral  Oral  SpO2: 100% 97% 98% 98%  Weight:      Height:        Intake/Output Summary (Last 24 hours) at 09/14/2020 0956 Last data filed at 09/14/2020 0630 Gross per 24 hour  Intake 400 ml  Output 1251 ml  Net -851 ml   Filed Weights   09/09/20 1839  Weight: 104.8 kg    Examination:  General: A/O x4, No acute respiratory distress Eyes: negative scleral hemorrhage, negative anisocoria, negative icterus ENT: Negative Runny nose, negative gingival bleeding, Neck:  Negative scars, masses, torticollis, lymphadenopathy, JVD Lungs: Clear to auscultation bilaterally without wheezes or crackles Cardiovascular: Regular rate and rhythm without murmur gallop or rub normal S1 and S2 Abdomen: negative abdominal pain, nondistended, positive soft, bowel  sounds, no rebound, no ascites, no appreciable mass Extremities: No significant cyanosis, clubbing, or edema bilateral lower extremities Skin: Negative rashes, lesions, ulcers Psychiatric:  Negative depression, negative anxiety, negative fatigue, negative mania  Central nervous system:  Cranial nerves II through XII intact, tongue/uvula midline, all extremities muscle strength 5/5, sensation intact throughout, negative dysarthria, negative expressive aphasia, negative receptive aphasia.  .     Data Reviewed: Care during the described time interval was provided by me .  I have reviewed this patient's available data, including medical history, events of note, physical examination, and all test results as part of my evaluation.  CBC: Recent Labs  Lab 09/10/20 0431 09/11/20 0555 09/12/20 1012 09/13/20 0456 09/14/20 0516  WBC 5.2 6.2 6.4 6.8 5.9  NEUTROABS 2.0 3.4 4.0 3.8 3.1  HGB 12.7* 12.0* 12.4* 12.1* 11.7*  HCT 37.4* 36.8* 37.2* 36.6* 35.6*  MCV 96.6 97.1 96.9 96.6 96.7  PLT 211 226 226 246 938   Basic Metabolic Panel: Recent Labs  Lab 09/10/20 0431 09/11/20 0555 09/12/20 0418 09/12/20 1012 09/13/20 0456 09/14/20 0516  NA 141 139 141  --  139 139  K 3.9 3.8 4.0  --  4.0 4.2  CL 109 105 108  --  108 106  CO2 25 25 22   --  24 25  GLUCOSE 139* 119* 78  --  100* 165*  BUN 21 19 16   --  19 17  CREATININE 0.99 0.68 0.69  --  0.71 0.70  CALCIUM 8.2* 8.3* 8.3*  --  8.6* 8.6*  MG 1.8 1.9  --  1.8 1.9 1.9  PHOS  --  2.9  --  2.9 3.4 3.4   GFR: Estimated Creatinine Clearance: 93.6 mL/min (by C-G formula based on SCr of 0.7 mg/dL). Liver Function Tests: Recent Labs  Lab 09/10/20 0431 09/11/20 0555 09/12/20 0418 09/13/20 0456 09/14/20 0516  AST 27 21 21 27 27   ALT 14 14 14 20 22   ALKPHOS 49 43 46 52 64  BILITOT 1.1 1.1 0.9 1.1 1.0  PROT 6.4* 6.3* 5.7* 6.4* 6.3*  ALBUMIN 3.0* 3.0* 2.8* 3.0* 2.9*   No results for input(s): LIPASE, AMYLASE in the last 168 hours. No  results for input(s): AMMONIA in the last 168 hours. Coagulation Profile: No results for input(s): INR, PROTIME in the last 168 hours. Cardiac Enzymes: No results for  input(s): CKTOTAL, CKMB, CKMBINDEX, TROPONINI in the last 168 hours. BNP (last 3 results) No results for input(s): PROBNP in the last 8760 hours. HbA1C: No results for input(s): HGBA1C in the last 72 hours. CBG: Recent Labs  Lab 09/13/20 0744 09/13/20 1142 09/13/20 1717 09/13/20 2109 09/14/20 0810  GLUCAP 71 150* 181* 165* 173*   Lipid Profile: No results for input(s): CHOL, HDL, LDLCALC, TRIG, CHOLHDL, LDLDIRECT in the last 72 hours. Thyroid Function Tests: No results for input(s): TSH, T4TOTAL, FREET4, T3FREE, THYROIDAB in the last 72 hours. Anemia Panel: Recent Labs    09/13/20 0456 09/14/20 0516  FERRITIN 56 48   Sepsis Labs: Recent Labs  Lab 09/09/20 1758 09/10/20 2238 09/11/20 0555  PROCALCITON <0.10 <0.10 <0.10    Recent Results (from the past 240 hour(s))  Culture, blood (Routine X 2) w Reflex to ID Panel     Status: Abnormal   Collection Time: 09/09/20  5:58 PM   Specimen: BLOOD  Result Value Ref Range Status   Specimen Description   Final    BLOOD BLOOD LEFT HAND Performed at [facility], [address], [place], [place] [address]    Special Requests   Final    BOTTLES DRAWN AEROBIC AND ANAEROBIC Blood Culture adequate volume Performed at [facility], [address] [place], [place] [address]    Culture  Setup Time   Final    GRAM POSITIVE COCCI IN BOTH AEROBIC AND ANAEROBIC BOTTLES CRITICAL VALUE NOTED.  VALUE IS CONSISTENT WITH PREVIOUSLY REPORTED AND CALLED VALUE. Performed at [facility], [address] [place], [place] [address]    Culture (A)  Final    STAPHYLOCOCCUS EPIDERMIDIS SUSCEPTIBILITIES PERFORMED ON PREVIOUS CULTURE WITHIN THE LAST 5 DAYS. Performed at [facility], [place] [address], [place], [place] [address]    Report Status  09/13/2020 FINAL  Final  Culture, blood (Routine X 2) w Reflex to ID Panel     Status: Abnormal   Collection Time: 09/09/20  5:58 PM   Specimen: BLOOD  Result Value Ref Range Status   Specimen Description   Final    BLOOD RIGHT ANTECUBITAL Performed at [facility], [address], [place], [place] [address]    Special Requests   Final    BOTTLES DRAWN AEROBIC AND ANAEROBIC Blood Culture adequate volume Performed at [facility], [address], [place], [place] [address]    Culture  Setup Time   Final    GRAM POSITIVE COCCI IN BOTH AEROBIC AND ANAEROBIC BOTTLES Organism ID to follow CRITICAL RESULT CALLED TO, READ BACK BY AND VERIFIED WITH: Darrin Luis, PHAR 1644 09/10/2020 DB Performed at [facility], [address] [place], [place] [address]    Culture STAPHYLOCOCCUS EPIDERMIDIS (A)  Final   Report Status 09/13/2020 FINAL  Final   Organism ID, Bacteria STAPHYLOCOCCUS EPIDERMIDIS  Final      Susceptibility   Staphylococcus epidermidis - MIC*    CIPROFLOXACIN 2 INTERMEDIATE Intermediate     ERYTHROMYCIN <=0.25 SENSITIVE Sensitive     GENTAMICIN <=0.5 SENSITIVE Sensitive     OXACILLIN RESISTANT Resistant     TETRACYCLINE <=1 SENSITIVE Sensitive     VANCOMYCIN <=0.5 SENSITIVE Sensitive     TRIMETH/SULFA <=10 SENSITIVE Sensitive     CLINDAMYCIN <=0.25 SENSITIVE Sensitive     RIFAMPIN <=0.5 SENSITIVE Sensitive     Inducible Clindamycin NEGATIVE Sensitive     * STAPHYLOCOCCUS EPIDERMIDIS  Blood Culture ID Panel (Reflexed)  Status: Abnormal   Collection Time: 09/09/20  5:58 PM  Result Value Ref Range Status   Enterococcus faecalis NOT DETECTED NOT DETECTED Final   Enterococcus Faecium NOT DETECTED NOT DETECTED Final   Listeria monocytogenes NOT DETECTED NOT DETECTED Final   Staphylococcus species DETECTED (A) NOT DETECTED Final    Comment: CRITICAL RESULT CALLED TO, READ BACK BY AND VERIFIED WITH: Bennye Alm 1644 09/10/2020 DB     Staphylococcus aureus (BCID) NOT DETECTED NOT DETECTED Final   Staphylococcus epidermidis DETECTED (A) NOT DETECTED Final    Comment: Methicillin (oxacillin) resistant coagulase negative staphylococcus. Possible blood culture contaminant (unless isolated from more than one blood culture draw or clinical case suggests pathogenicity). No antibiotic treatment is indicated for blood  culture contaminants. CRITICAL RESULT CALLED TO, READ BACK BY AND VERIFIED WITH: BRANDON BEERS, PHAR 1644 09/10/2020 DB    Staphylococcus lugdunensis NOT DETECTED NOT DETECTED Final   Streptococcus species NOT DETECTED NOT DETECTED Final   Streptococcus agalactiae NOT DETECTED NOT DETECTED Final   Streptococcus pneumoniae NOT DETECTED NOT DETECTED Final   Streptococcus pyogenes NOT DETECTED NOT DETECTED Final   A.calcoaceticus-baumannii NOT DETECTED NOT DETECTED Final   Bacteroides fragilis NOT DETECTED NOT DETECTED Final   Enterobacterales NOT DETECTED NOT DETECTED Final   Enterobacter cloacae complex NOT DETECTED NOT DETECTED Final   Escherichia coli NOT DETECTED NOT DETECTED Final   Klebsiella aerogenes NOT DETECTED NOT DETECTED Final   Klebsiella oxytoca NOT DETECTED NOT DETECTED Final   Klebsiella pneumoniae NOT DETECTED NOT DETECTED Final   Proteus species NOT DETECTED NOT DETECTED Final   Salmonella species NOT DETECTED NOT DETECTED Final   Serratia marcescens NOT DETECTED NOT DETECTED Final   Haemophilus influenzae NOT DETECTED NOT DETECTED Final   Neisseria meningitidis NOT DETECTED NOT DETECTED Final   Pseudomonas aeruginosa NOT DETECTED NOT DETECTED Final   Stenotrophomonas maltophilia NOT DETECTED NOT DETECTED Final   Candida albicans NOT DETECTED NOT DETECTED Final   Candida auris NOT DETECTED NOT DETECTED Final   Candida glabrata NOT DETECTED NOT DETECTED Final   Candida krusei NOT DETECTED NOT DETECTED Final   Candida parapsilosis NOT DETECTED NOT DETECTED Final   Candida tropicalis NOT DETECTED  NOT DETECTED Final   Cryptococcus neoformans/gattii NOT DETECTED NOT DETECTED Final   Methicillin resistance mecA/C DETECTED (A) NOT DETECTED Final    Comment: CRITICAL RESULT CALLED TO, READ BACK BY AND VERIFIED WITH: Bennye Alm 1644 09/10/2020 DB Performed at [facility], [address], [place], [place] [address]   Urine Culture     Status: None   Collection Time: 09/10/20 10:43 AM   Specimen: Urine, Random  Result Value Ref Range Status   Specimen Description   Final    URINE, RANDOM Performed at [facility], [address], [place], [place] [address]    Special Requests   Final    NONE Performed at [facility], [address], [place], [place] [address]    Culture   Final    NO GROWTH Performed at [facility], [address] [place], [place] [address]    Report Status 09/12/2020 FINAL  Final  CULTURE, BLOOD (ROUTINE X 2) w Reflex to ID Panel     Status: None (Preliminary result)   Collection Time: 09/10/20  7:02 PM   Specimen: BLOOD  Result Value Ref Range Status   Specimen Description BLOOD BLOOD RIGHT HAND  Final   Special Requests   Final    BOTTLES  DRAWN AEROBIC AND ANAEROBIC Blood Culture adequate volume   Culture   Final    NO GROWTH 4 DAYS Performed at [facility], [address] [place], [place] [address]    Report Status PENDING  Incomplete  CULTURE, BLOOD (ROUTINE X 2) w Reflex to ID Panel     Status: None (Preliminary result)   Collection Time: 09/10/20  7:04 PM   Specimen: BLOOD  Result Value Ref Range Status   Specimen Description BLOOD RIGHT ANTECUBITAL  Final   Special Requests   Final    BOTTLES DRAWN AEROBIC AND ANAEROBIC Blood Culture adequate volume   Culture   Final    NO GROWTH 4 DAYS Performed at [facility], [address], [place], [place] [address]    Report Status PENDING  Incomplete         Radiology Studies: No results found.      Scheduled Meds: .  vitamin C  500 mg Oral Daily  . aspirin EC  81 mg Oral QHS  . atorvastatin  80 mg Oral Daily  . Chlorhexidine Gluconate Cloth  6 each Topical Daily  . insulin aspart  0-5 Units Subcutaneous QHS  . insulin aspart  0-9 Units Subcutaneous TID WC  . insulin glargine  50 Units Subcutaneous BID  . ipratropium  2 puff Inhalation Q6H  . latanoprost  1 drop Both Eyes QHS  . tamsulosin  0.4 mg Oral QPC breakfast  . vitamin B-12  100 mcg Oral Daily  . zinc sulfate  220 mg Oral Daily   Continuous Infusions: . vancomycin       LOS: 4 days    Time spent:40 min    Signa Cheek, Geraldo Docker, MD [facility] Pager [phone]  If 7PM-7AM, please contact night-coverage [email or web address] Password [facility] 09/14/2020, 9:56 AM

## 2020-09-15 ENCOUNTER — Encounter: Payer: Self-pay | Admitting: Internal Medicine

## 2020-09-15 ENCOUNTER — Encounter
Admission: EM | Disposition: A | Discharge status: Home / Self Care | Payer: Self-pay | Source: Home / Self Care | Attending: Internal Medicine

## 2020-09-15 ENCOUNTER — Inpatient Hospital Stay: Payer: Medicare Other | Admitting: Anesthesiology

## 2020-09-15 ENCOUNTER — Inpatient Hospital Stay
Admit: 2020-09-15 | Discharge: 2020-09-15 | Disposition: A | Discharge status: Home / Self Care | Payer: Medicare Other | Attending: Cardiology | Admitting: Cardiology

## 2020-09-15 ENCOUNTER — Inpatient Hospital Stay: Payer: Self-pay

## 2020-09-15 DIAGNOSIS — R339 Retention of urine, unspecified: Secondary | ICD-10-CM

## 2020-09-15 DIAGNOSIS — R55 Syncope and collapse: Secondary | ICD-10-CM | POA: Diagnosis not present

## 2020-09-15 DIAGNOSIS — B957 Other staphylococcus as the cause of diseases classified elsewhere: Secondary | ICD-10-CM | POA: Diagnosis not present

## 2020-09-15 DIAGNOSIS — E119 Type 2 diabetes mellitus without complications: Secondary | ICD-10-CM

## 2020-09-15 DIAGNOSIS — R7881 Bacteremia: Secondary | ICD-10-CM | POA: Diagnosis not present

## 2020-09-15 DIAGNOSIS — I482 Chronic atrial fibrillation, unspecified: Secondary | ICD-10-CM | POA: Diagnosis not present

## 2020-09-15 DIAGNOSIS — U071 COVID-19: Secondary | ICD-10-CM | POA: Diagnosis not present

## 2020-09-15 DIAGNOSIS — R778 Other specified abnormalities of plasma proteins: Secondary | ICD-10-CM | POA: Diagnosis not present

## 2020-09-15 HISTORY — PX: TEE WITHOUT CARDIOVERSION: SHX5443

## 2020-09-15 LAB — CBC WITH DIFFERENTIAL/PLATELET
Abs Immature Granulocytes: 0.03 10*3/uL (ref 0.00–0.07)
Basophils Absolute: 0 10*3/uL (ref 0.0–0.1)
Basophils Relative: 1 %
Eosinophils Absolute: 0.3 10*3/uL (ref 0.0–0.5)
Eosinophils Relative: 4 %
HCT: 35.7 % — ABNORMAL LOW (ref 39.0–52.0)
Hemoglobin: 11.9 g/dL — ABNORMAL LOW (ref 13.0–17.0)
Immature Granulocytes: 0 %
Lymphocytes Relative: 25 %
Lymphs Abs: 1.8 10*3/uL (ref 0.7–4.0)
MCH: 32.5 pg (ref 26.0–34.0)
MCHC: 33.3 g/dL (ref 30.0–36.0)
MCV: 97.5 fL (ref 80.0–100.0)
Monocytes Absolute: 0.8 10*3/uL (ref 0.1–1.0)
Monocytes Relative: 11 %
Neutro Abs: 4.2 10*3/uL (ref 1.7–7.7)
Neutrophils Relative %: 59 %
Platelets: 269 10*3/uL (ref 150–400)
RBC: 3.66 MIL/uL — ABNORMAL LOW (ref 4.22–5.81)
RDW: 13.2 % (ref 11.5–15.5)
WBC: 7.2 10*3/uL (ref 4.0–10.5)
nRBC: 0 % (ref 0.0–0.2)

## 2020-09-15 LAB — MAGNESIUM: Magnesium: 1.8 mg/dL (ref 1.7–2.4)

## 2020-09-15 LAB — D-DIMER, QUANTITATIVE: D-Dimer, Quant: 1.05 ug/mL-FEU — ABNORMAL HIGH (ref 0.00–0.50)

## 2020-09-15 LAB — COMPREHENSIVE METABOLIC PANEL
ALT: 22 U/L (ref 0–44)
AST: 21 U/L (ref 15–41)
Albumin: 2.9 g/dL — ABNORMAL LOW (ref 3.5–5.0)
Alkaline Phosphatase: 68 U/L (ref 38–126)
Anion gap: 8 (ref 5–15)
BUN: 16 mg/dL (ref 8–23)
CO2: 25 mmol/L (ref 22–32)
Calcium: 8.6 mg/dL — ABNORMAL LOW (ref 8.9–10.3)
Chloride: 104 mmol/L (ref 98–111)
Creatinine, Ser: 0.7 mg/dL (ref 0.61–1.24)
GFR, Estimated: >60 mL/min (ref >60)
Glucose, Bld: 180 mg/dL — ABNORMAL HIGH (ref 70–99)
Potassium: 4.3 mmol/L (ref 3.5–5.1)
Sodium: 137 mmol/L (ref 135–145)
Total Bilirubin: 0.9 mg/dL (ref 0.3–1.2)
Total Protein: 6.3 g/dL — ABNORMAL LOW (ref 6.5–8.1)

## 2020-09-15 LAB — CULTURE, BLOOD (ROUTINE X 2)
Culture: NO GROWTH
Culture: NO GROWTH
Special Requests: ADEQUATE
Special Requests: ADEQUATE

## 2020-09-15 LAB — GLUCOSE, CAPILLARY
Glucose-Capillary: 145 mg/dL — ABNORMAL HIGH (ref 70–99)
Glucose-Capillary: 88 mg/dL (ref 70–99)
Glucose-Capillary: 82 mg/dL (ref 70–99)
Glucose-Capillary: 136 mg/dL — ABNORMAL HIGH (ref 70–99)
Glucose-Capillary: 130 mg/dL — ABNORMAL HIGH (ref 70–99)

## 2020-09-15 LAB — LACTATE DEHYDROGENASE: LDH: 130 U/L (ref 98–192)

## 2020-09-15 LAB — FERRITIN: Ferritin: 44 ng/mL (ref 24–336)

## 2020-09-15 LAB — ECHOCARDIOGRAM COMPLETE
AR max vel: 1.33 cm2
AV Area VTI: 1.35 cm2
AV Area mean vel: 1.37 cm2
AV Mean grad: 15.4 mmHg
AV Peak grad: 29 mmHg
Ao pk vel: 2.69 m/s
Area-P 1/2: 3.77 cm2
Height: 73 in
S' Lateral: 3.92 cm
Weight: 3696 oz

## 2020-09-15 LAB — PHOSPHORUS: Phosphorus: 3.2 mg/dL (ref 2.5–4.6)

## 2020-09-15 LAB — C-REACTIVE PROTEIN: CRP: 0.9 mg/dL (ref <1.0)

## 2020-09-15 SURGERY — ECHOCARDIOGRAM, TRANSESOPHAGEAL
Anesthesia: General

## 2020-09-15 MED ORDER — SODIUM CHLORIDE FLUSH 0.9 % IV SOLN
INTRAVENOUS | Status: AC
  Filled 2020-09-15: qty 10

## 2020-09-15 MED ORDER — PROPOFOL 10 MG/ML IV BOLUS
INTRAVENOUS | Status: DC | PRN
  Administered 2020-09-15: 40 mg via INTRAVENOUS
  Administered 2020-09-15: 50 mg via INTRAVENOUS
  Administered 2020-09-15 (×3): 20 mg via INTRAVENOUS

## 2020-09-15 MED ORDER — SODIUM CHLORIDE 0.9 % IV SOLN: INTRAVENOUS | Status: DC

## 2020-09-15 MED ORDER — BUTAMBEN-TETRACAINE-BENZOCAINE 2-2-14 % EX AERO
INHALATION_SPRAY | CUTANEOUS | Status: AC
  Filled 2020-09-15: qty 5

## 2020-09-15 MED ORDER — PHENYLEPHRINE HCL (PRESSORS) 10 MG/ML IV SOLN
INTRAVENOUS | Status: DC | PRN
  Administered 2020-09-15 (×2): 100 ug via INTRAVENOUS

## 2020-09-15 MED ORDER — LIDOCAINE VISCOUS HCL 2 % MT SOLN
OROMUCOSAL | Status: AC
  Filled 2020-09-15: qty 15

## 2020-09-15 MED ORDER — PROPOFOL 10 MG/ML IV BOLUS
INTRAVENOUS | Status: AC
  Filled 2020-09-15: qty 20

## 2020-09-15 NOTE — Anesthesia Procedure Notes (Signed)
Procedure Name: MAC Date/Time: 09/15/2020 10:48 AM Performed by: Jerrye Noble, CRNA Pre-anesthesia Checklist: Patient identified, Emergency Drugs available, Patient being monitored and Suction available Patient Re-evaluated:Patient Re-evaluated prior to induction Oxygen Delivery Method: Simple face mask Preoxygenation: Pre-oxygenation with 100% oxygen

## 2020-09-15 NOTE — TOC Progression Note (Signed)
Transition of Care [facility]) - Progression Note    Patient Details  Name: Mathew Baker MRN: [id number] Date of Birth: Mar 13, 1941  Transition of [facility] CM/SW Contact  Beverly Sessions, RN Phone Number: 09/15/2020, 4:54 PM  Clinical Narrative:     Confirmed with ID that patient will require home IV antibiotics at discharge.  Discussed discharge disposition with patient. He is agreeable to home health RN, but does not want PT or OT Patient states that he does not have a preference of home health agency.  Referral made and accepted by [facility] with [facility].  Referral for infusion made to [facility] with [facility]  Expected Discharge Plan: [place] Barriers to Discharge: Continued Medical Work up  Expected Discharge Plan and Services Expected Discharge Plan: [place] Choice: NA Living arrangements for the past 2 months: [place]: RN [facility] Agency: [place] ([place]) Date [place]: 09/15/20   Representative spoke with at [place]: [place] ([place]) Interventions    Readmission Risk Interventions No flowsheet data found.

## 2020-09-15 NOTE — Progress Notes (Signed)
*  PRELIMINARY RESULTS* Echocardiogram Echocardiogram Transesophageal has been performed.  Sherrie Sport 09/15/2020, 11:15 AM

## 2020-09-15 NOTE — Care Management Important Message (Signed)
Important Message  Patient Details  Name: Mathew Baker MRN: [id number] Date of Birth: 05/17/1941   Medicare Important Message Given:  Yes  Reviewed verbally with patient via room phone due to isolation status. Declined copy of Medicare IM at this time.    Dannette Barbara 09/15/2020, 3:20 PM

## 2020-09-15 NOTE — Progress Notes (Signed)
PROGRESS NOTE    Mathew Baker  FVC:[id number] DOB: 10-Nov-1940 DOA: 09/09/2020 PCP: Lauro Regulus, MD     Brief Narrative:  80 y.o. WM PMHx HTN, HLD, DM type II with hyperglycemia, stroke, Atrial fibrillation on Eliquis, BPH,   Presents with syncope and shortness of breath.  Patient states that he has been having cough, shortness of breath for more than 2 weeks.  He has chest pressure, denies active chest pain.  No fever or chills.  He has nausea, no vomiting, diarrhea or abdominal pain.  No symptoms of UTI.  No rectal bleeding or dark stool.  Patient states that he had lightheadedness, and passed out in the bathroom. He did did strike his head.  No unilateral numbness or tingling see extremities.  No facial droop or slurred speech. Patient was found to have hypotension with blood pressure 89/41 in ed, which improved to 139/79 later one without treatment.   ED Course: pt was found to have positive Covid Ag test, WBC 6.6, troponin 212, BNP 356, pending urinalysis, electrolytes renal function okay, temperature normal, heart rate 72, RR 15, oxygen saturation 94% on room air, chest x-ray negative.  CT head is negative for acute intracranial abnormalities.  CT of C-spine is negative for acute issues.  Patient is placed on MedSurg bed for observation.   Subjective: 1/10 afebrile overnight A/O x4, sitting comfortably in chair eating his lunch    afebrile overnight A/O x4, patient sitting comfortably in the bed.  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  Chest x-ray negative for infiltration.  Will not start remdesivir and will steroid.  Will check inflammatory markers. -vitamin C, zinc.  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  Heart rate is 72 -Switch Eliquis to IV heparin due to elevated troponin - 1/6 cardiology signed off -1/8 Apixaban (held) frank hematuria.  Hold until after TEE -1/9 NSR  Elevated troponin:  Troponin 212, no chest pain, possibly due to demand ischemia.  Dr. Gwen Pounds of  cardiology is consulted -Per cardiology; Continue serial ECG and enzymes to assess for possible rhythm disturbances causing weakness fatigue or syncope and/or possibility of myocardial infarction  Hypotension:  -Blood pressure 89/41, which improved to 139/79.  Etiology is not clear.  Patient does not seem to have sepsis clinically.    Most likely secondary dehydration and continuation of Zestoretic -IV fluid: 500 cc normal  saline -Monitor blood pressure closely -Hold Zestoretic -Resolved  Syncope:  No focal neuro deficit on physical examination.  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  CT of C-spine is negative.  Possibly due to hypotension.  Patient is on Eliquis, low suspicions for PE.  Will check D-dimer.  -PT; recommends home health PT -Treat hypotension  HLD (hyperlipidemia) -Lipitor  Acute urinary retention/UTI? -1/5 urine culture negative -Bladder scan, performed in and out catheter and send urine for culture.   -Flomax 0.4 mg daily -1/8 discontinue Foley.  Voiding trial  -1/8 developed frank hematuria, will DC apixaban -1/8 spoke with Dr.Stoioff urology who will see patient requested that 20 French Foley be placed at bedside  Positive Staph epidermidis bacteremia -4/4 bottles positive for MRSE.  Would consider this a true infection -Currently patient not spiking fevers, negative leukocytosis, will reorder cultures. -Obtain procalcitonin -1/6 vancomycin empirically started -1/7 consult ID - 1/7 echocardiogram pending -1/7 per ID may also require TEE secondary to patient having loop recorder which will have to be removed.  Consult cardiology in a.m. - 1/7 per ID will require 4 to 6 weeks of IV antibiotics  -1/8 sent chat to [place] cardiology requesting that patient be seen tomorrow.  Will await recommendations -1/10 PICC line placement requested - Schedule follow-up with Dr.Jayashree Ravishankar, ID, for 4 weeks, bacteremia  Constipation - 1/8 smog enema     DVT prophylaxis: Apixaban; 1/8 hold secondary to frank hematuria Code Status: Full Family Communication: 1/9 called Sharyn Lull (wife) counseled on plan of care answered all questions Status is: Inpatient    Dispo: The patient is from: Home              Anticipated d/c is to: Home;               Anticipated d/c date is: 1/10              Patient currently  stable      Consultants:  Dr. Nehemiah Massed of cardiology Dr.Stoioff urology  Procedures/Significant Events:  1/9 echocardiogram;LVEF= 60 to 65%.  -Grade I diastolic dysfunction (impaired relaxation).  1/10 TEE; LVEF= 55% no vegetations on valves, no PFO or ASD    I have personally reviewed and interpreted all radiology studies and my findings are as above.  VENTILATOR SETTINGS: Room air 1/10 SPO2 100%   Cultures 1/4 blood RIGHT AC positive staph epidermidis (MRSE): 1/4 blood LEFT hand positive staph epidermidis (MRSE) 1/5.  Urine negative final 1/5 blood RIGHT hand no growth final 1/5 blood RIGHT AC no growth final   Antimicrobials: Anti-infectives (From admission, onward)   Start     Ordered Stop   09/12/20 0600  vancomycin (VANCOCIN) IVPB 1000 mg/200 mL premix        09/11/20 1332     09/11/20 1400  vancomycin (VANCOREADY) IVPB 2000 mg/400 mL        09/11/20 1311 09/11/20 1613   09/10/20 1000  remdesivir 100 mg in sodium chloride 0.9 % 100 mL IVPB  Status:  Discontinued       "Followed  by" Linked Group Details   09/09/20 1545 09/09/20 1913   09/09/20 1645  remdesivir 200 mg in sodium chloride 0.9% 250 mL IVPB  Status:  Discontinued       "Followed by" Linked Group Details   09/09/20 1545 09/09/20 1913      Devices    LINES / TUBES:      Continuous Infusions: . vancomycin 1,250 mg (09/15/20 0612)     Objective: Vitals:   09/15/20 1111 09/15/20 1130 09/15/20 1140 09/15/20 1157  BP: 133/65 121/63 134/84 (!) 144/72  Pulse: (!) 56 (!) 57 (!) 56   Resp: 15 16 17    Temp:    98.7 F (37.1 C)  TempSrc:    Oral  SpO2: 100% 98% 100% 100%  Weight:      Height:        Intake/Output Summary (Last 24 hours) at 09/15/2020 1312 Last data filed at 09/15/2020 1109 Gross per 24 hour  Intake 750 ml  Output 2000 ml  Net -1250 ml   Filed Weights   09/09/20 1839  Weight: 104.8 kg    Examination:  General: A/O x4, No acute respiratory distress Eyes:  negative scleral hemorrhage, negative anisocoria, negative icterus ENT: Negative Runny nose, negative gingival bleeding, Neck:  Negative scars, masses, torticollis, lymphadenopathy, JVD Lungs: Clear to auscultation bilaterally without wheezes or crackles Cardiovascular: Regular rate and rhythm without murmur gallop or rub normal S1 and S2 Abdomen: negative abdominal pain, nondistended, positive soft, bowel sounds, no rebound, no ascites, no appreciable mass Extremities: No significant cyanosis, clubbing, or edema bilateral lower extremities Skin: Negative rashes, lesions, ulcers Psychiatric:  Negative depression, negative anxiety, negative fatigue, negative mania  Central nervous system:  Cranial nerves II through XII intact, tongue/uvula midline, all extremities muscle strength 5/5, sensation intact throughout, negative dysarthria, negative expressive aphasia, negative receptive aphasia.  .     Data Reviewed: Care during the described time interval was provided by me .  I have reviewed this patient's available data, including medical history, events of note, physical examination, and all test results as part of my evaluation.  CBC: Recent Labs  Lab 09/11/20 0555 09/12/20 1012 09/13/20 0456 09/14/20 0516 09/15/20 0620  WBC 6.2 6.4 6.8 5.9 7.2  NEUTROABS 3.4 4.0 3.8 3.1 4.2  HGB 12.0* 12.4* 12.1* 11.7* 11.9*  HCT 36.8* 37.2* 36.6* 35.6* 35.7*  MCV 97.1 96.9 96.6 96.7 97.5  PLT 226 226 246 252 573   Basic Metabolic Panel: Recent Labs  Lab 09/11/20 0555 09/12/20 0418 09/12/20 1012 09/13/20 0456 09/14/20 0516 09/15/20 0620  NA 139 141  --  139 139 137  K 3.8 4.0  --  4.0 4.2 4.3  CL 105 108  --  108 106 104  CO2 25 22  --  24 25 25   GLUCOSE 119* 78  --  100* 165* 180*  BUN 19 16  --  19 17 16   CREATININE 0.68 0.69  --  0.71 0.70 0.70  CALCIUM 8.3* 8.3*  --  8.6* 8.6* 8.6*  MG 1.9  --  1.8 1.9 1.9 1.8  PHOS 2.9  --  2.9 3.4 3.4 3.2   GFR: Estimated Creatinine  Clearance: 93.6 mL/min (by C-G formula based on SCr of 0.7 mg/dL). Liver Function Tests: Recent Labs  Lab 09/11/20 0555 09/12/20 0418 09/13/20 0456 09/14/20 0516 09/15/20 0620  AST 21 21 27 27 21   ALT 14 14 20 22 22   ALKPHOS 43 46 52 64 68  BILITOT 1.1 0.9 1.1  1.0 0.9  PROT 6.3* 5.7* 6.4* 6.3* 6.3*  ALBUMIN 3.0* 2.8* 3.0* 2.9* 2.9*   No results for input(s): LIPASE, AMYLASE in the last 168 hours. No results for input(s): AMMONIA in the last 168 hours. Coagulation Profile: No results for input(s): INR, PROTIME in the last 168 hours. Cardiac Enzymes: No results for input(s): CKTOTAL, CKMB, CKMBINDEX, TROPONINI in the last 168 hours. BNP (last 3 results) No results for input(s): PROBNP in the last 8760 hours. HbA1C: No results for input(s): HGBA1C in the last 72 hours. CBG: Recent Labs  Lab 09/14/20 1727 09/14/20 2158 09/15/20 0815 09/15/20 1040 09/15/20 1154  GLUCAP 167* 139* 145* 88 82   Lipid Profile: No results for input(s): CHOL, HDL, LDLCALC, TRIG, CHOLHDL, LDLDIRECT in the last 72 hours. Thyroid Function Tests: No results for input(s): TSH, T4TOTAL, FREET4, T3FREE, THYROIDAB in the last 72 hours. Anemia Panel: Recent Labs    09/14/20 0516 09/15/20 0620  FERRITIN 48 44   Sepsis Labs: Recent Labs  Lab 09/09/20 1758 09/10/20 2238 09/11/20 0555  PROCALCITON <0.10 <0.10 <0.10    Recent Results (from the past 240 hour(s))  Culture, blood (Routine X 2) w Reflex to ID Panel     Status: Abnormal   Collection Time: 09/09/20  5:58 PM   Specimen: BLOOD  Result Value Ref Range Status   Specimen Description   Final    BLOOD BLOOD LEFT HAND Performed at [facility], [address] [place], [place] [address]    Special Requests   Final    BOTTLES DRAWN AEROBIC AND ANAEROBIC Blood Culture adequate volume Performed at [facility], [address], [place], [place] [address]    Culture  Setup Time   Final    GRAM POSITIVE COCCI IN BOTH  AEROBIC AND ANAEROBIC BOTTLES CRITICAL VALUE NOTED.  VALUE IS CONSISTENT WITH PREVIOUSLY REPORTED AND CALLED VALUE. Performed at [facility], [address] [place], [place] [address]    Culture (A)  Final    STAPHYLOCOCCUS EPIDERMIDIS SUSCEPTIBILITIES PERFORMED ON PREVIOUS CULTURE WITHIN THE LAST 5 DAYS. Performed at [facility], [place] [address] [place], [place] [address]    Report Status 09/13/2020 FINAL  Final  Culture, blood (Routine X 2) w Reflex to ID Panel     Status: Abnormal   Collection Time: 09/09/20  5:58 PM   Specimen: BLOOD  Result Value Ref Range Status   Specimen Description   Final    BLOOD RIGHT ANTECUBITAL Performed at [facility], [address], [place], [place] [address]    Special Requests   Final    BOTTLES DRAWN AEROBIC AND ANAEROBIC Blood Culture adequate volume Performed at [facility], [address], [place], [place] [address]    Culture  Setup Time   Final    GRAM POSITIVE COCCI IN BOTH AEROBIC AND ANAEROBIC BOTTLES Organism ID to follow CRITICAL RESULT CALLED TO, READ BACK BY AND VERIFIED WITH: Darrin Luis, PHAR 1644 09/10/2020 DB Performed at [facility], [address] [place],  [address]    Culture STAPHYLOCOCCUS EPIDERMIDIS (A)  Final   Report Status 09/13/2020 FINAL  Final   Organism ID, Bacteria STAPHYLOCOCCUS EPIDERMIDIS  Final      Susceptibility   Staphylococcus epidermidis - MIC*    CIPROFLOXACIN 2 INTERMEDIATE Intermediate     ERYTHROMYCIN <=0.25 SENSITIVE Sensitive     GENTAMICIN <=0.5 SENSITIVE Sensitive     OXACILLIN RESISTANT Resistant     TETRACYCLINE <=1 SENSITIVE  Sensitive     VANCOMYCIN <=0.5 SENSITIVE Sensitive     TRIMETH/SULFA <=10 SENSITIVE Sensitive     CLINDAMYCIN <=0.25 SENSITIVE Sensitive     RIFAMPIN <=0.5 SENSITIVE Sensitive     Inducible Clindamycin NEGATIVE Sensitive     * STAPHYLOCOCCUS EPIDERMIDIS  Blood Culture ID Panel (Reflexed)     Status:  Abnormal   Collection Time: 09/09/20  5:58 PM  Result Value Ref Range Status   Enterococcus faecalis NOT DETECTED NOT DETECTED Final   Enterococcus Faecium NOT DETECTED NOT DETECTED Final   Listeria monocytogenes NOT DETECTED NOT DETECTED Final   Staphylococcus species DETECTED (A) NOT DETECTED Final    Comment: CRITICAL RESULT CALLED TO, READ BACK BY AND VERIFIED WITH: Sander Radon 1644 09/10/2020 DB    Staphylococcus aureus (BCID) NOT DETECTED NOT DETECTED Final   Staphylococcus epidermidis DETECTED (A) NOT DETECTED Final    Comment: Methicillin (oxacillin) resistant coagulase negative staphylococcus. Possible blood culture contaminant (unless isolated from more than one blood culture draw or clinical case suggests pathogenicity). No antibiotic treatment is indicated for blood  culture contaminants. CRITICAL RESULT CALLED TO, READ BACK BY AND VERIFIED WITH: BRANDON BEERS, PHAR 1644 09/10/2020 DB    Staphylococcus lugdunensis NOT DETECTED NOT DETECTED Final   Streptococcus species NOT DETECTED NOT DETECTED Final   Streptococcus agalactiae NOT DETECTED NOT DETECTED Final   Streptococcus pneumoniae NOT DETECTED NOT DETECTED Final   Streptococcus pyogenes NOT DETECTED NOT DETECTED Final   A.calcoaceticus-baumannii NOT DETECTED NOT DETECTED Final   Bacteroides fragilis NOT DETECTED NOT DETECTED Final   Enterobacterales NOT DETECTED NOT DETECTED Final   Enterobacter cloacae complex NOT DETECTED NOT DETECTED Final   Escherichia coli NOT DETECTED NOT DETECTED Final   Klebsiella aerogenes NOT DETECTED NOT DETECTED Final   Klebsiella oxytoca NOT DETECTED NOT DETECTED Final   Klebsiella pneumoniae NOT DETECTED NOT DETECTED Final   Proteus species NOT DETECTED NOT DETECTED Final   Salmonella species NOT DETECTED NOT DETECTED Final   Serratia marcescens NOT DETECTED NOT DETECTED Final   Haemophilus influenzae NOT DETECTED NOT DETECTED Final   Neisseria meningitidis NOT DETECTED NOT DETECTED  Final   Pseudomonas aeruginosa NOT DETECTED NOT DETECTED Final   Stenotrophomonas maltophilia NOT DETECTED NOT DETECTED Final   Candida albicans NOT DETECTED NOT DETECTED Final   Candida auris NOT DETECTED NOT DETECTED Final   Candida glabrata NOT DETECTED NOT DETECTED Final   Candida krusei NOT DETECTED NOT DETECTED Final   Candida parapsilosis NOT DETECTED NOT DETECTED Final   Candida tropicalis NOT DETECTED NOT DETECTED Final   Cryptococcus neoformans/gattii NOT DETECTED NOT DETECTED Final   Methicillin resistance mecA/C DETECTED (A) NOT DETECTED Final    Comment: CRITICAL RESULT CALLED TO, READ BACK BY AND VERIFIED WITH: Sander Radon 1644 09/10/2020 DB Performed at [facility], [address], [place], [place] [address]   Urine Culture     Status: None   Collection Time: 09/10/20 10:43 AM   Specimen: Urine, Random  Result Value Ref Range Status   Specimen Description   Final    URINE, RANDOM Performed at [facility], [address] [place], [place] [address]    Special Requests   Final    NONE Performed at [facility], [address], [place], [place] [address]    Culture   Final    NO GROWTH Performed at [facility], [address] [place], [place] [address]    Report Status 09/12/2020 FINAL  Final  CULTURE, BLOOD (ROUTINE X 2) w Reflex to ID Panel     Status: None   Collection Time: 09/10/20  7:02 PM   Specimen: BLOOD  Result Value Ref Range Status   Specimen Description BLOOD BLOOD RIGHT HAND  Final   Special Requests   Final    BOTTLES DRAWN AEROBIC AND ANAEROBIC Blood Culture adequate volume   Culture   Final    NO GROWTH 5 DAYS Performed at [facility], [address] [place], [place] [address]    Report Status 09/15/2020 FINAL  Final  CULTURE, BLOOD (ROUTINE X 2) w Reflex to ID Panel     Status: None   Collection Time: 09/10/20  7:04 PM   Specimen: BLOOD  Result Value Ref Range Status   Specimen  Description BLOOD RIGHT ANTECUBITAL  Final   Special Requests   Final    BOTTLES DRAWN AEROBIC AND ANAEROBIC Blood Culture adequate volume   Culture   Final    NO GROWTH 5 DAYS Performed at [facility], [address], [place], [place] [address]    Report Status 09/15/2020 FINAL  Final         Radiology Studies: ECHOCARDIOGRAM COMPLETE  Result Date: 09/15/2020    ECHOCARDIOGRAM REPORT   Patient Name:   [name] Date of Exam: 09/14/2020 Medical Rec #:  [id number]     Height:       73.0 in Accession #:    [id number]    Weight:       231.0 lb Date of Birth:  1941/07/28     BSA:          2.288 m Patient Age:    36 years      BP:           137/65 mmHg Patient Gender: M             HR:           61 bpm. Exam Location:  [facility] Procedure: 2D Echo Indications:     BACTEREMIA R78.81  History:         Patient has prior history of Echocardiogram examinations.                  Arrythmias:Atrial Fibrillation, Signs/Symptoms:Chest Pain; Risk                  Factors:Diabetes, Dyslipidemia and Hypertension.  Sonographer:     Avanell Shackleton Referring Phys:  [id number] Tsosie Billing Diagnosing Phys: Isaias Cowman MD IMPRESSIONS  1. Left ventricular ejection fraction, by estimation, is 60 to 65%. The left ventricle has normal function. The left ventricle has no regional wall motion abnormalities. Left ventricular diastolic parameters are consistent with Grade I diastolic dysfunction (impaired relaxation).  2. Right ventricular systolic function is normal. The right ventricular size is normal.  3. The mitral valve is normal in structure. Trivial mitral valve regurgitation. No evidence of mitral stenosis.  4. The aortic valve is normal in structure. Aortic valve regurgitation is not visualized. Mild aortic valve stenosis.  5. The inferior vena cava is normal in size with greater than 50% respiratory variability, suggesting right atrial pressure of 3 mmHg. FINDINGS  Left Ventricle: Left ventricular  ejection fraction, by estimation, is 60 to 65%. The left ventricle has normal function. The left ventricle has no regional wall motion abnormalities. The left ventricular internal cavity size was normal in size. There is  no left ventricular hypertrophy. Left ventricular diastolic parameters are consistent with  Grade I diastolic dysfunction (impaired relaxation). Right Ventricle: The right ventricular size is normal. No increase in right ventricular wall thickness. Right ventricular systolic function is normal. Left Atrium: Left atrial size was normal in size. Right Atrium: Right atrial size was normal in size. Pericardium: There is no evidence of pericardial effusion. Mitral Valve: The mitral valve is normal in structure. Trivial mitral valve regurgitation. No evidence of mitral valve stenosis. Tricuspid Valve: The tricuspid valve is normal in structure. Tricuspid valve regurgitation is trivial. No evidence of tricuspid stenosis. Aortic Valve: The aortic valve is normal in structure. Aortic valve regurgitation is not visualized. Mild aortic stenosis is present. Aortic valve mean gradient measures 15.4 mmHg. Aortic valve peak gradient measures 29.0 mmHg. Aortic valve area, by VTI measures 1.35 cm. Pulmonic Valve: The pulmonic valve was normal in structure. Pulmonic valve regurgitation is not visualized. No evidence of pulmonic stenosis. Aorta: The aortic root is normal in size and structure. Venous: The inferior vena cava is normal in size with greater than 50% respiratory variability, suggesting right atrial pressure of 3 mmHg. IAS/Shunts: No atrial level shunt detected by color flow Doppler.  LEFT VENTRICLE PLAX 2D LVIDd:         5.58 cm  Diastology LVIDs:         3.92 cm  LV e' medial:    7.62 cm/s LV PW:         0.98 cm  LV E/e' medial:  12.3 LV IVS:        1.01 cm  LV e' lateral:   6.09 cm/s LVOT diam:     2.20 cm  LV E/e' lateral: 15.4 LV SV:         82 LV SV Index:   36 LVOT Area:     3.80 cm  IVC IVC diam:  1.40 cm LEFT ATRIUM             Index       RIGHT ATRIUM           Index LA diam:        3.85 cm 1.68 cm/m  RA Area:     22.60 cm LA Vol (A2C):   55.1 ml 24.09 ml/m RA Volume:   73.50 ml  32.13 ml/m LA Vol (A4C):   78.1 ml 34.14 ml/m LA Biplane Vol: 67.5 ml 29.51 ml/m  AORTIC VALVE AV Area (Vmax):    1.33 cm AV Area (Vmean):   1.37 cm AV Area (VTI):     1.35 cm AV Vmax:           269.20 cm/s AV Vmean:          184.400 cm/s AV VTI:            0.612 m AV Peak Grad:      29.0 mmHg AV Mean Grad:      15.4 mmHg LVOT Vmax:         94.00 cm/s LVOT Vmean:        66.600 cm/s LVOT VTI:          0.217 m LVOT/AV VTI ratio: 0.35  AORTA Ao Root diam: 3.80 cm MITRAL VALVE                TRICUSPID VALVE MV Area (PHT): 3.77 cm     TR Peak grad:   16.6 mmHg MV Decel Time: 201 msec     TR Vmax:        204.00 cm/s MV E velocity: 93.80  cm/s MV A velocity: 105.00 cm/s  SHUNTS MV E/A ratio:  0.89         Systemic VTI:  0.22 m                             Systemic Diam: 2.20 cm Isaias Cowman MD Electronically signed by Isaias Cowman MD Signature Date/Time: 09/15/2020/9:33:41 AM    Final         Scheduled Meds: . vitamin C  500 mg Oral Daily  . aspirin EC  81 mg Oral QHS  . atorvastatin  80 mg Oral Daily  . Chlorhexidine Gluconate Cloth  6 each Topical Daily  . insulin aspart  0-5 Units Subcutaneous QHS  . insulin aspart  0-9 Units Subcutaneous TID WC  . insulin glargine  50 Units Subcutaneous BID  . ipratropium  2 puff Inhalation Q6H  . latanoprost  1 drop Both Eyes QHS  . tamsulosin  0.4 mg Oral QPC breakfast  . vitamin B-12  100 mcg Oral Daily  . zinc sulfate  220 mg Oral Daily   Continuous Infusions: . vancomycin 1,250 mg (09/15/20 0612)     LOS: 5 days    Time spent:40 min    Anzlee Hinesley, Geraldo Docker, MD [facility] Pager [phone]  If 7PM-7AM, please contact night-coverage [email or web address] Password [email or web address] 09/15/2020, 1:12 PM

## 2020-09-15 NOTE — Progress Notes (Signed)
PT Cancellation Note  Patient Details Name: Mathew Baker MRN: [id number] DOB: 1941-07-24   Cancelled Treatment:    Reason Eval/Treat Not Completed: Patient at procedure or test/unavailable.  Pt currently off unit for procedure.  Will re-attempt PT treatment session at a later date/time as able.  Leitha Bleak, PT 09/15/20, 10:49 AM

## 2020-09-15 NOTE — OR Nursing (Signed)
PT in [place] Room to undergo TEE. PT will recover here as well.

## 2020-09-15 NOTE — Progress Notes (Addendum)
ID Patient doing fine No fever No dizziness Had TEE this morning and it was negative. Patient Vitals for the past 24 hrs:  BP Temp Temp src Pulse Resp SpO2  09/15/20 1340 (!) 153/76 97.7 F (36.5 C) Oral (!) 58 18 100 %  09/15/20 1157 (!) 144/72 98.7 F (37.1 C) Oral -- -- 100 %  09/15/20 1140 134/84 -- -- (!) 56 17 100 %  09/15/20 1130 121/63 -- -- (!) 57 16 98 %  09/15/20 1111 133/65 -- -- (!) 56 15 100 %  09/15/20 1110 -- -- -- 61 16 100 %  09/15/20 1109 (!) 144/62 -- -- (!) 54 16 100 %  09/15/20 1108 -- -- -- (!) 51 17 100 %  09/15/20 1107 -- -- -- (!) 53 (!) 9 100 %  09/15/20 1106 (!) 128/100 -- -- (!) 53 (!) 7 100 %  09/15/20 1105 -- -- -- (!) 58 (!) 8 100 %  09/15/20 1104 -- -- -- (!) 56 (!) 6 100 %  09/15/20 1103 (!) 106/54 -- -- (!) 59 (!) 8 100 %  09/15/20 1102 -- -- -- 60 (!) 2 100 %  09/15/20 1101 (!) 81/43 -- -- 61 (!) 2 100 %  09/15/20 1100 (!) 72/40 -- -- 61 (!) 7 100 %  09/15/20 1059 -- -- -- (!) 58 (!) 1 99 %  09/15/20 1058 -- -- -- 63 12 100 %  09/15/20 1057 -- -- -- (!) 57 14 100 %  09/15/20 1056 -- -- -- 65 10 100 %  09/15/20 1055 -- -- -- 66 (!) 4 100 %  09/15/20 1054 (!) 112/51 -- -- 63 (!) 3 100 %  09/15/20 1053 -- -- -- (!) 55 (!) 23 100 %  09/15/20 1052 -- -- -- (!) 53 (!) 22 100 %  09/15/20 1051 (!) 149/62 -- -- (!) 56 14 100 %  09/15/20 1050 -- -- -- (!) 56 14 100 %  09/15/20 1049 -- -- -- (!) 58 17 100 %  09/15/20 1048 139/66 -- -- (!) 57 15 100 %  09/15/20 1047 -- -- -- (!) 57 -- 99 %  09/15/20 1046 -- -- -- (!) 59 -- 100 %  09/15/20 1045 (!) 151/60 -- -- (!) 58 -- 100 %  09/15/20 1044 (!) 149/66 -- -- (!) 59 18 100 %  09/15/20 1030 (!) 146/78 -- -- -- 16 99 %  09/15/20 0818 129/66 97.9 F (36.6 C) Oral (!) 55 18 96 %  09/15/20 0534 137/62 98 F (36.7 C) Oral (!) 57 16 96 %  09/14/20 2204 136/67 98.8 F (37.1 C) Oral 60 -- 94 %  09/14/20 1731 (!) 142/73 97.9 F (36.6 C) Oral 62 18 96 %   Awake and alert No distress Chest bilateral air  entry Heart sounds S1-S2 Foley catheter CBC Latest Ref Rng & Units 09/15/2020 09/14/2020 09/13/2020  WBC 4.0 - 10.5 K/uL 7.2 5.9 6.8  Hemoglobin 13.0 - 17.0 g/dL 11.9(L) 11.7(L) 12.1(L)  Hematocrit 39.0 - 52.0 % 35.7(L) 35.6(L) 36.6(L)  Platelets 150 - 400 K/uL 269 252 246    CMP Latest Ref Rng & Units 09/15/2020 09/14/2020 09/13/2020  Glucose 70 - 99 mg/dL 180(H) 165(H) 100(H)  BUN 8 - 23 mg/dL 16 17 19   Creatinine 0.61 - 1.24 mg/dL 0.70 0.70 0.71  Sodium 135 - 145 mmol/L 137 139 139  Potassium 3.5 - 5.1 mmol/L 4.3 4.2 4.0  Chloride 98 - 111 mmol/L 104 106 108  CO2  22 - 32 mmol/L 25 25 24   Calcium 8.9 - 10.3 mg/dL 8.6(L) 8.6(L) 8.6(L)  Total Protein 6.5 - 8.1 g/dL 6.3(L) 6.3(L) 6.4(L)  Total Bilirubin 0.3 - 1.2 mg/dL 0.9 1.0 1.1  Alkaline Phos 38 - 126 U/L 68 64 52  AST 15 - 41 U/L 21 27 27   ALT 0 - 44 U/L 22 22 20    Microbiology blood culture 09/09/2020 staph coccus epidermidis 4 out of 4 bottles   Impression/recommendation  Near syncope.  Patient with underlying cerebellar infarcts and dizziness.  CT head no acute infarct  SARS-CoV-2 illness.  Patient has no pneumonia.  Mildly symptomatic.  He has been vaccinated.  He got remdesivir.  staph epidermidis bacteremia 4 out of 4.  From 2 different sites.  This cannot be considered as a contaminant.  TEE was done and is negative for vegetation.  He has a loop recorder and it has to be removed.  Continue vancomycin.  Will need minimum of 4 weeks of treatment. Will need PICC line Repeat blood culture negative.  From 09/10/2020.  Diabetes mellitus  Urinary retention.  Patient was found to have retention this admission and was on Foley catheter until 09/13/2020.  He passed voiding trial.  Foley removed and then had some bleeding from his penis.  Seen by urologist.  Passing urine fine now  .Marland Kitchen  History of A. fib History of cerebellar infarcts.  Discussed the management with the patient and the care team.

## 2020-09-15 NOTE — Transfer of Care (Signed)
Immediate Anesthesia Transfer of Care Note  Patient: Mathew Baker  Procedure(s) Performed: TRANSESOPHAGEAL ECHOCARDIOGRAM (TEE) (N/A )  Patient Location: PACU and REcovered in procedure room by [employer] RN  Anesthesia Type:General  Level of Consciousness: awake, drowsy and patient cooperative  Airway & Oxygen Therapy: Patient Spontanous Breathing and Patient connected to face mask oxygen  Post-op Assessment: Report given to RN and Post -op Vital signs reviewed and stable  Post vital signs: Reviewed and stable  Last Vitals:  Vitals Value Taken Time  BP 144/62 09/15/20 1109  Temp    Pulse 61 09/15/20 1110  Resp 16 09/15/20 1110  SpO2 100 % 09/15/20 1110    Last Pain:  Vitals:   09/15/20 1030  TempSrc:   PainSc: 0-No pain         Complications: No complications documented.

## 2020-09-15 NOTE — Anesthesia Preprocedure Evaluation (Addendum)
Anesthesia Evaluation  Patient identified by MRN, date of birth, ID band Patient awake    Reviewed: Allergy & Precautions, NPO status , Patient's Chart, lab work & pertinent test results  Airway Mallampati: III       Dental   Pulmonary COPD, former smoker,    Pulmonary exam normal        Cardiovascular hypertension, Normal cardiovascular exam+ dysrhythmias Atrial Fibrillation      Neuro/Psych CVA negative psych ROS   GI/Hepatic negative GI ROS, Neg liver ROS,   Endo/Other  diabetes  Renal/GU negative Renal ROS  negative genitourinary   Musculoskeletal  (+) Arthritis , Osteoarthritis,    Abdominal Normal abdominal exam  (+)   Peds negative pediatric ROS (+)  Hematology negative hematology ROS (+)   Anesthesia Other Findings Past Medical History: No date: Chronic airway obstruction (HCC) No date: Degeneration of intervertebral disc of lumbar region No date: Diabetes mellitus without complication (HCC) No date: Essential hypertension, benign No date: Hyperlipidemia No date: Hypertension No date: Osteoarthritis of lower extremity No date: Pneumonia, organism unspecified(486) 07/16/2013: PSA elevation     Comment:  Normal No date: Stroke [facility])  Reproductive/Obstetrics                            Anesthesia Physical Anesthesia Plan  ASA: III  Anesthesia Plan: General   Post-op Pain Management:    Induction: Intravenous  PONV Risk Score and Plan:   Airway Management Planned: Nasal Cannula and Mask  Additional Equipment:   Intra-op Plan:   Post-operative Plan:   Informed Consent: I have reviewed the patients History and Physical, chart, labs and discussed the procedure including the risks, benefits and alternatives for the proposed anesthesia with the patient or authorized representative who has indicated his/her understanding and acceptance.     Dental advisory given  Plan  Discussed with: CRNA and Surgeon  Anesthesia Plan Comments:        Anesthesia Quick Evaluation

## 2020-09-15 NOTE — Procedures (Signed)
   TRANSESOPHAGEAL ECHOCARDIOGRAM   NAME:  Mathew Baker   MRN: [id number] DOB:  02/21/1941   ADMIT DATE: 09/09/2020  INDICATIONS:   PROCEDURE:   Informed consent was obtained prior to the procedure. The risks, benefits and alternatives for the procedure were discussed and the patient comprehended these risks.  Risks include, but are not limited to, cough, sore throat, vomiting, nausea, somnolence, esophageal and stomach trauma or perforation, bleeding, low blood pressure, aspiration, pneumonia, infection, trauma to the teeth and death.    After a procedural time-out, the patient was sedated with 150 mg propofol per deptarment of anesthesia. Sedation time was 30 minutes. The transesophageal probe was inserted in the esophagus and stomach without difficulty and multiple views were obtained. I was present for the entire procedure.    COMPLICATIONS:    There were no immediate complications.  FINDINGS:  LEFT VENTRICLE: EF = 55%. No regional wall motion abnormalities.  RIGHT VENTRICLE: Normal size and function.   LEFT ATRIUM: No clots.  LEFT ATRIAL APPENDAGE: No thrombus.   RIGHT ATRIUM: normal  AORTIC VALVE:  Trileaflet. Calcifications with no vegetations seen. Cira Servant  MITRAL VALVE:    Normal. No vegetations. Trivial mr  TRICUSPID VALVE: Normal. Trivial tr. No vegetations  PULMONIC VALVE: Grossly normal.  INTERATRIAL SEPTUM: No PFO or ASD. Agitated saline contrast was used.   PERICARDIUM: No effusion  DESCENDING AORTA: normal  CONCLUSION:  No vegetations seen. Calcified aortic valve

## 2020-09-15 NOTE — Progress Notes (Signed)
OT Cancellation Note  Patient Details Name: Mathew Baker MRN: [id number] DOB: Jun 23, 1941   Cancelled Treatment:    Reason Eval/Treat Not Completed: Patient at procedure or test/ unavailable . Pt currently having transesophageal echocardiogram. OT will follow up when pt is next available for OT intervention.   Darleen Crocker, [place], OTR/L , CBIS ascom [phone]  09/15/20, 11:52 AM   09/15/2020, 11:51 AM

## 2020-09-16 ENCOUNTER — Encounter: Payer: Self-pay | Admitting: Cardiology

## 2020-09-16 DIAGNOSIS — R7881 Bacteremia: Secondary | ICD-10-CM | POA: Diagnosis not present

## 2020-09-16 DIAGNOSIS — I482 Chronic atrial fibrillation, unspecified: Secondary | ICD-10-CM | POA: Diagnosis not present

## 2020-09-16 DIAGNOSIS — U071 COVID-19: Secondary | ICD-10-CM | POA: Diagnosis not present

## 2020-09-16 DIAGNOSIS — R55 Syncope and collapse: Secondary | ICD-10-CM | POA: Diagnosis not present

## 2020-09-16 LAB — COMPREHENSIVE METABOLIC PANEL
ALT: 18 U/L (ref 0–44)
AST: 21 U/L (ref 15–41)
Albumin: 3 g/dL — ABNORMAL LOW (ref 3.5–5.0)
Alkaline Phosphatase: 56 U/L (ref 38–126)
Anion gap: 10 (ref 5–15)
BUN: 16 mg/dL (ref 8–23)
CO2: 24 mmol/L (ref 22–32)
Calcium: 8.8 mg/dL — ABNORMAL LOW (ref 8.9–10.3)
Chloride: 104 mmol/L (ref 98–111)
Creatinine, Ser: 0.74 mg/dL (ref 0.61–1.24)
GFR, Estimated: >60 mL/min (ref >60)
Glucose, Bld: 152 mg/dL — ABNORMAL HIGH (ref 70–99)
Potassium: 4.2 mmol/L (ref 3.5–5.1)
Sodium: 138 mmol/L (ref 135–145)
Total Bilirubin: 0.9 mg/dL (ref 0.3–1.2)
Total Protein: 6.4 g/dL — ABNORMAL LOW (ref 6.5–8.1)

## 2020-09-16 LAB — GLUCOSE, CAPILLARY
Glucose-Capillary: 173 mg/dL — ABNORMAL HIGH (ref 70–99)
Glucose-Capillary: 233 mg/dL — ABNORMAL HIGH (ref 70–99)

## 2020-09-16 LAB — CBC WITH DIFFERENTIAL/PLATELET
Abs Immature Granulocytes: 0.04 10*3/uL (ref 0.00–0.07)
Basophils Absolute: 0.1 10*3/uL (ref 0.0–0.1)
Basophils Relative: 1 %
Eosinophils Absolute: 0.4 10*3/uL (ref 0.0–0.5)
Eosinophils Relative: 5 %
HCT: 36.4 % — ABNORMAL LOW (ref 39.0–52.0)
Hemoglobin: 12.1 g/dL — ABNORMAL LOW (ref 13.0–17.0)
Immature Granulocytes: 1 %
Lymphocytes Relative: 26 %
Lymphs Abs: 1.8 10*3/uL (ref 0.7–4.0)
MCH: 32.4 pg (ref 26.0–34.0)
MCHC: 33.2 g/dL (ref 30.0–36.0)
MCV: 97.6 fL (ref 80.0–100.0)
Monocytes Absolute: 0.9 10*3/uL (ref 0.1–1.0)
Monocytes Relative: 13 %
Neutro Abs: 3.9 10*3/uL (ref 1.7–7.7)
Neutrophils Relative %: 54 %
Platelets: 278 10*3/uL (ref 150–400)
RBC: 3.73 MIL/uL — ABNORMAL LOW (ref 4.22–5.81)
RDW: 13.2 % (ref 11.5–15.5)
WBC: 7 10*3/uL (ref 4.0–10.5)
nRBC: 0 % (ref 0.0–0.2)

## 2020-09-16 LAB — VANCOMYCIN, RANDOM: Vancomycin Rm: 13

## 2020-09-16 LAB — LACTATE DEHYDROGENASE: LDH: 137 U/L (ref 98–192)

## 2020-09-16 LAB — MAGNESIUM: Magnesium: 2 mg/dL (ref 1.7–2.4)

## 2020-09-16 LAB — FERRITIN: Ferritin: 47 ng/mL (ref 24–336)

## 2020-09-16 LAB — C-REACTIVE PROTEIN: CRP: 0.9 mg/dL (ref <1.0)

## 2020-09-16 LAB — PHOSPHORUS: Phosphorus: 3.9 mg/dL (ref 2.5–4.6)

## 2020-09-16 LAB — D-DIMER, QUANTITATIVE: D-Dimer, Quant: 1.18 ug/mL-FEU — ABNORMAL HIGH (ref 0.00–0.50)

## 2020-09-16 MED ORDER — SODIUM CHLORIDE 0.9% FLUSH
10.0000 mL | INTRAVENOUS | Status: DC | PRN
Start: 2020-09-16 — End: 2020-09-16

## 2020-09-16 MED ORDER — DM-GUAIFENESIN ER 30-600 MG PO TB12: 1.0000 | ORAL_TABLET | Freq: Two times a day (BID) | ORAL | 0 refills | Status: DC | PRN

## 2020-09-16 MED ORDER — IPRATROPIUM BROMIDE HFA 17 MCG/ACT IN AERS: 2.0000 | INHALATION_SPRAY | Freq: Four times a day (QID) | RESPIRATORY_TRACT | 0 refills | Status: DC

## 2020-09-16 MED ORDER — ASCORBIC ACID 500 MG PO TABS: 500.0000 mg | ORAL_TABLET | Freq: Every day | ORAL | 0 refills | Status: DC

## 2020-09-16 MED ORDER — ZINC SULFATE 220 (50 ZN) MG PO CAPS: 220.0000 mg | ORAL_CAPSULE | Freq: Every day | ORAL | 0 refills | Status: DC

## 2020-09-16 MED ORDER — VANCOMYCIN IV (FOR PTA / DISCHARGE USE ONLY): 1250.0000 mg | Freq: Two times a day (BID) | INTRAVENOUS | 0 refills | Status: AC

## 2020-09-16 MED ORDER — SODIUM CHLORIDE 0.9% FLUSH: 10.0000 mL | Freq: Two times a day (BID) | INTRAVENOUS | Status: DC

## 2020-09-16 NOTE — Discharge Summary (Signed)
Physician Discharge Summary  Mathew Baker WEX:[id number] DOB: [birth year] DOA: 09/09/2020  PCP: Kirk Ruths, MD  Admit date: 09/09/2020 Discharge date: 09/16/2020  Time spent: 35 minutes  Recommendations for Outpatient Follow-up:  Covid vaccination; vaccinated 2/3    COVID-19 virus infection: COVID-19 Labs  Recent Labs    09/14/20 0516 09/15/20 0620 09/16/20 0537  DDIMER 0.99* 1.05* 1.18*  FERRITIN 48 44 47  LDH 142 130 137  CRP 1.0* 0.9 0.9    Lab Results  Component Value Date   SARSCOV2NAA NEGATIVE 02/18/2020   [place] NEGATIVE 03/19/2019   -Patient has cough and shortness of breath, but no fever, chills, no oxygen desaturation. Chest x-ray negative for infiltration. Will not start remdesivir and will steroid. Will check inflammatory markers. -vitamin C, zinc.  -Bronchodilators -PRN Mucinex for cough -Gentle IV fluid:  -If patient develops oxygen desaturation,Will ask the patient to maintain an awake prone position for 16+ hours a day, if possible, with a minimum of 2-3 hours at a time -Will attempt to maintain euvolemia to a net negative fluid status -Patient will be contagious until 1/25, patient is exercise precautions such as isolation, face covering, frequent washing of hands etc.  See below  Uncontrolled type 2 diabetes mellitus with microalbuminuria ([place]): -1/5 hemoglobin A1c = 8.0  - Restart home medication regimen  Chronic Diastolic CHF -Strict ins and outs -4.6 L - Daily weight Filed Weights   09/09/20 1839  Weight: 104.8 kg  - See HTN   HTN (hypertension): -Hold blood pressure medications due to hypotension -Cardiology to restart BP medication if/when needed  Atrial fibrillation, chronic: -Apixaban restarted   Elevated troponin: Troponin 212, no chest pain, possibly due to demand ischemia. Dr. Nehemiah Massed of cardiology is consulted -Per cardiology; Continue serial ECG and enzymes to assess for possible rhythm  disturbances causing weakness fatigue or syncope and/or possibility of myocardial infarction.  Hypotension:  -Blood pressure 89/41, which improved to 139/79. Etiology is not clear. Patient does not seem to have sepsis clinically.   Most likely secondary dehydration and continuation of Zestoretic -IV fluid: 500 cc normal saline -Monitor blood pressure closely -Hold Zestoretic -Resolved  Syncope: No focal neuro deficit on physical examination. 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 CT of C-spine is negative. Possibly due to hypotension. Patient is on Eliquis, low suspicions for PE. Will check D-dimer.  -PT; recommends home health PT -Schedule follow-up appointment with Dr. Ubaldo Glassing cardiology after 1/25 to remove loop recorder per ID recommendations  HLD (hyperlipidemia) -Lipitor 80 mg daily  Acute urinary retention/UTI? -1/5 urine culture negative -Bladder scan, performed in and out catheter and send urine for culture.   -Flomax 0.4 mg daily -Resolved if patient develops new symptoms may see Dr.Stoioff urology PRN  Positive Staph epidermidis bacteremia -4/4 bottles positive for MRSE.  Would consider this a true infection -Currently patient not spiking fevers, negative leukocytosis, will reorder cultures. -Obtain procalcitonin -1/6 vancomycin empirically started -1/7 consult ID - 1/7 echocardiogram; see results below -1/7 per ID may also require TEE secondary to patient having loop recorder which will have to be removed.  Consult cardiology in a.m. - 1/7 per ID will require 4 to 6 weeks of IV antibiotics  -1/8 sent chat to [place] cardiology requesting that patient be seen tomorrow.  Will await recommendations -1/10 PICC line placement requested - Schedule follow-up with Dr.Jayashree Ravishankar, ID, for 4 weeks, bacteremia  Constipation -  Resolved    Discharge Diagnoses:  Principal Problem:  COVID-19 virus infection Active Problems:   Uncontrolled type 2 diabetes mellitus with microalbuminuria (H[facility])   HTN (hypertension)   HLD (hyperlipidemia)   Syncope and collapse   Syncope   Elevated troponin   Atrial fibrillation, chronic ([place])   Hypotension   Discharge Condition: Stable  Diet recommendation: Heart healthy  Filed Weights   09/09/20 1839  Weight: 104.8 kg    History of present illness:  80 y.o.WM PMHx HTN, HLD, DM type II with hyperglycemia, stroke, Atrial fibrillation on Eliquis, BPH,   Presents with syncope and shortness of breath.  Patient states that he has been having cough, shortness of breath for more than 2 weeks. He has chest pressure, denies active chest pain. No fever or chills. He has nausea, no vomiting, diarrhea or abdominal pain. No symptoms of UTI. No rectal bleeding or dark stool. Patient states that he hadlightheadedness, and passed out in the bathroom.He diddid strike his head.No unilateral numbness or tingling see extremities. No facial droop or slurred speech. Patient was found to have hypotension with blood pressure 89/41 in ed, which improved to 139/79later one without treatment.  ED Course:pt was found to have positive Covid Ag test, WBC 6.6, troponin212, BNP 356, pending urinalysis, electrolytes renal function okay, temperature normal, heart rate 72, RR 15, oxygen saturation 94% on room air, chest x-ray negative. CT head is negative for acute intracranial abnormalities. CT of C-spine is negative for acute issues. Patient is placed on MedSurg bed for observation.  Hospital Course:  See above  Procedures: 1/9 echocardiogram;LVEF= 60 to 65%.  -Grade I diastolic dysfunction (impaired relaxation).  1/10 TEE; LVEF= 55% no vegetations on valves, no PFO or ASD   Consultations: Dr. Nehemiah Massed of cardiology Dr.Stoioff urology Dr.Jayashree Ravishankar, ID,  Cultures  1/4 blood RIGHT AC positive staph epidermidis  (MRSE): 1/4 blood LEFT hand positive staph epidermidis (MRSE) 1/5.  Urine negative final 1/5 blood RIGHT hand no growth final 1/5 blood RIGHT AC no growth final  Antibiotics Anti-infectives (From admission, onward)   Start     Ordered Stop   09/16/20 0000  vancomycin IVPB        09/16/20 1342 10/14/20 2359   09/14/20 1800  vancomycin (VANCOREADY) IVPB 1250 mg/250 mL        09/14/20 0721     09/12/20 0600  vancomycin (VANCOCIN) IVPB 1000 mg/200 mL premix  Status:  Discontinued        09/11/20 1332 09/14/20 0721   09/11/20 1400  vancomycin (VANCOREADY) IVPB 2000 mg/400 mL        09/11/20 1311 09/11/20 1613   09/10/20 1000  remdesivir 100 mg in sodium chloride 0.9 % 100 mL IVPB  Status:  Discontinued       "Followed by" Linked Group Details   09/09/20 1545 09/09/20 1913   09/09/20 1645  remdesivir 200 mg in sodium chloride 0.9% 250 mL IVPB  Status:  Discontinued       "Followed by" Linked Group Details   09/09/20 1545 09/09/20 1913       Discharge Exam: Vitals:   09/15/20 2004 09/16/20 0011 09/16/20 0217 09/16/20 1159  BP: (!) 129/54 (!) 153/71 128/81 127/67  Pulse: (!) 57 60 (!) 54 (!) 58  Resp: _0 Temp: 98 F (36.7 C) 97.7 F (36.5 C) 98.6 F (37 C) 98.1 F (36.7 C)  TempSrc: Oral Oral Oral Oral  SpO2: 99% 97% 98% 97%  Weight:      Height:  General: A/O x4, No acute respiratory distress Eyes: negative scleral hemorrhage, negative anisocoria, negative icterus ENT: Negative Runny nose, negative gingival bleeding, Neck:  Negative scars, masses, torticollis, lymphadenopathy, JVD Lungs: Clear to auscultation bilaterally without wheezes or crackles Cardiovascular: Regular rate and rhythm without murmur gallop or rub normal S1 and S2    Discharge Instructions  Discharge Instructions    [facility] pharmacist to adjust dose for Vancomycin, Aminoglycosides and other anti-infective therapies as requested by physician.   Complete by: As  directed    [facility] to provide Cath Flo 335m   Complete by: As directed    Administer for PICC line occlusion and as ordered by physician for other access device issues.   Anaphylaxis Kit: Provided to treat any anaphylactic reaction to the medication being provided to the patient if First Dose or when requested by physician   Complete by: As directed    Epinephrine 167mml vial / amp: Administer 0.35m34m0.35ml90mubcutaneously once for moderate to severe anaphylaxis, nurse to call physician and pharmacy when reaction occurs and call 911 if needed for immediate care   Diphenhydramine 50mg21mIV vial: Administer 25-50mg 69mM PRN for first dose reaction, rash, itching, mild reaction, nurse to call physician and pharmacy when reaction occurs   Sodium Chloride 0.9% NS 500ml I65mdminister if needed for hypovolemic blood pressure drop or as ordered by physician after call to physician with anaphylactic reaction   Change dressing on IV access line weekly and PRN   Complete by: As directed    Flush IV access with Sodium Chloride 0.9% and Heparin 10 units/ml or 100 units/ml   Complete by: As directed    Home infusion instructions - [facility]   Complete by: As directed    Instructions: Flush IV access with Sodium Chloride 0.9% and Heparin 10units/ml or 100units/ml   Change dressing on IV access line: Weekly and PRN   Instructions Cath Flo 2mg: Ad4mister for PICC Line occlusion and as ordered by physician for other access device   [facility] pharmacist to adjust dose for: Vancomycin, Aminoglycosides and other anti-infective therapies as requested by physician   Method of administration may be changed at the discretion of home infusion pharmacist based upon assessment of the patient and/or caregiver's ability to self-administer the medication ordered   Complete by: As directed      Allergies as of 09/16/2020   No Known Allergies     Medication List    STOP taking  these medications   lisinopril-hydrochlorothiazide 10-12.5 MG tablet Commonly known as: ZESTORETIC     TAKE these medications   ascorbic acid 500 MG tablet Commonly known as: VITAMIN C Take 1 tablet (500 mg total) by mouth daily. Start taking on: September 17, 2020   aspirin EC 81 MG tablet Take 81 mg by mouth at bedtime. Swallow whole.   atorvastatin 80 MG tablet Commonly known as: LIPITOR Take 80 mg by mouth daily.   dextromethorphan-guaiFENesin 30-600 MG 12hr tablet Commonly known as: MUCINEX DM Take 1 tablet by mouth 2 (two) times daily as needed for cough.   Eliquis 5 MG Tabs tablet Generic drug: apixaban Take 5 mg by mouth every 12 (twelve) hours.   insulin aspart 100 UNIT/ML injection Commonly known as: novoLOG Inject 0-9 Units into the skin 3 (three) times daily with meals. What changed:   how much to take  when to take this  reasons to take this   insulin glargine 100 UNIT/ML injection  Commonly known as: LANTUS Inject 70 Units into the skin 2 (two) times daily.   ipratropium 17 MCG/ACT inhaler Commonly known as: ATROVENT HFA Inhale 2 puffs into the lungs every 6 (six) hours.   latanoprost 0.005 % ophthalmic solution Commonly known as: XALATAN Place 1 drop into both eyes at bedtime.   tamsulosin 0.4 MG Caps capsule Commonly known as: FLOMAX Take 0.4 mg by mouth at bedtime.   vancomycin  IVPB Inject 1,250 mg into the vein every 12 (twelve) hours for 28 days. Indication: MRSE bacteremia First Dose: Yes Last Day of Therapy:  10/14/2020 Please check vancomycin trough and BMP 09/17/2020 Labs - Monday:  CBC/D, CMP, and vancomycin trough. Labs - Thursday (If vancomycin trough and BMP done 09/17/2020, then start 09/25/2020):  BMP and vancomycin trough Please remove PICC upon completion of antibiotics Method of administration:Elastomeric Method of administration may be changed at the discretion of the patient and/or caregiver's ability to self-administer the  medication ordered.   vitamin B-12 100 MCG tablet Commonly known as: CYANOCOBALAMIN Take 100 mcg by mouth daily.   zinc sulfate 220 (50 Zn) MG capsule Take 1 capsule (220 mg total) by mouth daily. Start taking on: September 17, 2020            Discharge Care Instructions  (From admission, onward)         Start     Ordered   09/16/20 0000  Change dressing on IV access line weekly and PRN  (Home infusion instructions - [facility] )        09/16/20 1342         No Known Allergies  Follow-up Information    Tsosie Billing, MD. Schedule an appointment as soon as possible for a visit in 4 week(s).   Specialty: Infectious Diseases Why: Schedule follow-up with Dr.Jayashree Ravishankar, ID, for 4 weeks, bacteremia Contact information: [place] [address] [phone]        Teodoro Spray, MD. Schedule an appointment as soon as possible for a visit in 3 week(s).   Specialty: Cardiology Why: -Schedule follow-up appointment with Dr. Ubaldo Glassing cardiology after 1/25 to remove loop recorder per ID recommendations Contact information: [place] [address] [phone]                The results of significant diagnostics from this hospitalization (including imaging, microbiology, ancillary and laboratory) are listed below for reference.    Significant Diagnostic Studies: DG Chest 2 View  Result Date: 09/09/2020 CLINICAL DATA:  SOB EXAM: CHEST - 2 VIEW COMPARISON:  12/20/2016 and prior. FINDINGS: No focal consolidation. No pneumothorax or pleural effusion. Stable cardiomediastinal silhouette. Indwelling loop recorder. Multilevel spondylosis. IMPRESSION: No acute airspace disease. Electronically Signed   By: Primitivo Gauze M.D.   On: 09/09/2020 15:06   CT Head Wo Contrast  Result Date: 09/09/2020 CLINICAL DATA:  Syncope with fall.  Transient disorientation EXAM: CT HEAD WITHOUT CONTRAST TECHNIQUE: Contiguous  axial images were obtained from the base of the skull through the vertex without intravenous contrast. COMPARISON:  Jan 09, 2020 FINDINGS: Brain: Age related volume loss is stable. There is no demonstrable intracranial mass, hemorrhage, extra-axial fluid collection, or midline shift. There is stable encephalomalacia along the inferior cerebellar vermis and adjacent inferior cerebellum bilaterally near the midline. Prior small infarct in the posteromedial mid right cerebellum stable. Prior small lacunar infarct in the anterior limb of the right external capsule is stable. Mild small vessel disease in  the centra semiovale bilaterally is stable. No acute appearing infarct is demonstrable on this study. Vascular: No hyperdense vessel. Calcification in the distal left vertebral artery and carotid siphon regions bilaterally noted. Skull: Bony calvarium appears intact. Sinuses/Orbits: Mucosal thickening and opacification noted in several ethmoid air cells. Mucosal thickening noted in the right sphenoid sinus. Small retention cyst in the anterior left sphenoid sinus. Visualized orbits appear symmetric bilaterally. Other: Mastoid air cells are clear. IMPRESSION: Age related volume loss with slight periventricular small vessel disease, stable. Prior posterior midline cerebellar infarct. Small nearby right posteromedial lacunar infarct in the cerebellum. Prior small infarct in the anterior limb of the right external capsule. No evident acute infarct. No mass or hemorrhage. There are foci of arterial vascular calcification. There is paranasal sinus disease at several sites. Electronically Signed   By: Lowella Grip III M.D.   On: 09/09/2020 12:10   CT Cervical Spine Wo Contrast  Result Date: 09/09/2020 CLINICAL DATA:  Syncope, fell EXAM: CT CERVICAL SPINE WITHOUT CONTRAST TECHNIQUE: Multidetector CT imaging of the cervical spine was performed without intravenous contrast. Multiplanar CT image reconstructions were also  generated. COMPARISON:  None. FINDINGS: Alignment: No spondylolisthesis. Mild reversal of the normal cervical lordosis. Skull base and vertebrae: No fracture or focal bone lesion. Exuberant anterior endplate spurring at all cervical levels. Soft tissues and spinal canal: No prevertebral hematoma or soft tissue swelling. Coarse carotid bifurcation atheromatous calcifications. Disc levels: Posterior protrusions and associated endplate spurring M1-D6. Mild narrowing C3-4, moderate narrowing C4-C7 interspaces. Facet and uncovertebral DJD contribute to osseous foraminal stenosis most marked left C3-4, right C4-5, left greater than right C5-C7. Upper chest: Negative. Other: None IMPRESSION: 1. Negative for fracture or other acute bone abnormality. 2. Multilevel cervical spondylitic changes as above. 3. Loss of the normal cervical spine lordosis, which may be secondary to positioning, spasm, or soft tissue injury. 4. Bilateral carotid  calcifications. Electronically Signed   By: Lucrezia Europe M.D.   On: 09/09/2020 15:11   ECHOCARDIOGRAM COMPLETE  Result Date: 09/15/2020    ECHOCARDIOGRAM REPORT   Patient Name:   JAQUARI RECKNER Date of Exam: 09/14/2020 Medical Rec #:  [id number]     Height:       73.0 in Accession #:    [id number]    Weight:       231.0 lb Date of Birth:  October 24, 1940     BSA:          2.288 m Patient Age:    17 years      BP:           137/65 mmHg Patient Gender: M             HR:           61 bpm. Exam Location:  [facility] Procedure: 2D Echo Indications:     BACTEREMIA R78.81  History:         Patient has prior history of Echocardiogram examinations.                  Arrythmias:Atrial Fibrillation, Signs/Symptoms:Chest Pain; Risk                  Factors:Diabetes, Dyslipidemia and Hypertension.  Sonographer:     Avanell Shackleton Referring Phys:  [id number] Tsosie Billing Diagnosing Phys: Isaias Cowman MD IMPRESSIONS  1. Left ventricular ejection fraction, by estimation, is 60 to 65%. The left ventricle has  normal function. The left ventricle has no regional wall motion abnormalities. Left ventricular diastolic  parameters are consistent with Grade I diastolic dysfunction (impaired relaxation).  2. Right ventricular systolic function is normal. The right ventricular size is normal.  3. The mitral valve is normal in structure. Trivial mitral valve regurgitation. No evidence of mitral stenosis.  4. The aortic valve is normal in structure. Aortic valve regurgitation is not visualized. Mild aortic valve stenosis.  5. The inferior vena cava is normal in size with greater than 50% respiratory variability, suggesting right atrial pressure of 3 mmHg. FINDINGS  Left Ventricle: Left ventricular ejection fraction, by estimation, is 60 to 65%. The left ventricle has normal function. The left ventricle has no regional wall motion abnormalities. The left ventricular internal cavity size was normal in size. There is  no left ventricular hypertrophy. Left ventricular diastolic parameters are consistent with Grade I diastolic dysfunction (impaired relaxation). Right Ventricle: The right ventricular size is normal. No increase in right ventricular wall thickness. Right ventricular systolic function is normal. Left Atrium: Left atrial size was normal in size. Right Atrium: Right atrial size was normal in size. Pericardium: There is no evidence of pericardial effusion. Mitral Valve: The mitral valve is normal in structure. Trivial mitral valve regurgitation. No evidence of mitral valve stenosis. Tricuspid Valve: The tricuspid valve is normal in structure. Tricuspid valve regurgitation is trivial. No evidence of tricuspid stenosis. Aortic Valve: The aortic valve is normal in structure. Aortic valve regurgitation is not visualized. Mild aortic stenosis is present. Aortic valve mean gradient measures 15.4 mmHg. Aortic valve peak gradient measures 29.0 mmHg. Aortic valve area, by VTI measures 1.35 cm. Pulmonic Valve: The pulmonic valve was  normal in structure. Pulmonic valve regurgitation is not visualized. No evidence of pulmonic stenosis. Aorta: The aortic root is normal in size and structure. Venous: The inferior vena cava is normal in size with greater than 50% respiratory variability, suggesting right atrial pressure of 3 mmHg. IAS/Shunts: No atrial level shunt detected by color flow Doppler.  LEFT VENTRICLE PLAX 2D LVIDd:         5.58 cm  Diastology LVIDs:         3.92 cm  LV e' medial:    7.62 cm/s LV PW:         0.98 cm  LV E/e' medial:  12.3 LV IVS:        1.01 cm  LV e' lateral:   6.09 cm/s LVOT diam:     2.20 cm  LV E/e' lateral: 15.4 LV SV:         82 LV SV Index:   36 LVOT Area:     3.80 cm  IVC IVC diam: 1.40 cm LEFT ATRIUM             Index       RIGHT ATRIUM           Index LA diam:        3.85 cm 1.68 cm/m  RA Area:     22.60 cm LA Vol (A2C):   55.1 ml 24.09 ml/m RA Volume:   73.50 ml  32.13 ml/m LA Vol (A4C):   78.1 ml 34.14 ml/m LA Biplane Vol: 67.5 ml 29.51 ml/m  AORTIC VALVE AV Area (Vmax):    1.33 cm AV Area (Vmean):   1.37 cm AV Area (VTI):     1.35 cm AV Vmax:           269.20 cm/s AV Vmean:          184.400 cm/s AV VTI:  0.612 m AV Peak Grad:      29.0 mmHg AV Mean Grad:      15.4 mmHg LVOT Vmax:         94.00 cm/s LVOT Vmean:        66.600 cm/s LVOT VTI:          0.217 m LVOT/AV VTI ratio: 0.35  AORTA Ao Root diam: 3.80 cm MITRAL VALVE                TRICUSPID VALVE MV Area (PHT): 3.77 cm     TR Peak grad:   16.6 mmHg MV Decel Time: 201 msec     TR Vmax:        204.00 cm/s MV E velocity: 93.80 cm/s MV A velocity: 105.00 cm/s  SHUNTS MV E/A ratio:  0.89         Systemic VTI:  0.22 m                             Systemic Diam: 2.20 cm Isaias Cowman MD Electronically signed by Isaias Cowman MD Signature Date/Time: 09/15/2020/9:33:41 AM    Final    ECHO TEE  Result Date: 09/15/2020    TRANSESOPHOGEAL ECHO REPORT   Patient Name:   [name] Date of Exam: 09/15/2020 Medical Rec #:  [id number]      Height:       73.0 in Accession #:    [id number]    Weight:       231.0 lb Date of Birth:  29-Dec-1940     BSA:          2.288 m Patient Age:    34 years      BP:           129/66 mmHg Patient Gender: M             HR:           55 bpm. Exam Location:  [facility] Procedure: Transesophageal Echo, Color Doppler, Cardiac Doppler and [place]            Contrast Bubble Study Indications:     Bacteremia 790.7/ R78.81  History:         Patient has prior history of Echocardiogram examinations, most                  recent 09/14/2020. Stroke; Risk Factors:Hypertension and                  Diabetes.  Sonographer:     Sherrie Sport RDCS (AE) Referring Phys:  [place] Diagnosing Phys: Bartholome Bill MD PROCEDURE: After discussion of the risks and benefits of a TEE, an informed consent was obtained from the patient. TEE procedure time was 30 minutes. The transesophogeal probe was passed without difficulty through the esophogus of the patient. Imaged were obtained with the patient in a left lateral decubitus position. Sedation performed by different physician. Image quality was excellent. The patient's vital signs; including heart rate, blood pressure, and oxygen saturation; remained stable throughout the procedure. The patient developed no complications during the procedure. IMPRESSIONS  1. Left ventricular ejection fraction, by estimation, is 55 to 60%. The left ventricle has normal function. The left ventricle has no regional wall motion abnormalities. Left ventricular diastolic parameters were normal.  2. Right ventricular systolic function is normal. The right ventricular size is normal.  3. No left atrial/left atrial appendage thrombus was detected.  4. The mitral valve is normal in structure. Trivial mitral valve regurgitation.  5. The aortic valve is calcified. Aortic valve regurgitation is trivial. Conclusion(s)/Recommendation(s): No evidence of vegetation/infective endocarditis on this transesophageal echocardiogram.  FINDINGS  Left Ventricle: Left ventricular ejection fraction, by estimation, is 55 to 60%. The left ventricle has normal function. The left ventricle has no regional wall motion abnormalities. The left ventricular internal cavity size was normal in size. Left ventricular diastolic parameters were normal. Right Ventricle: The right ventricular size is normal. No increase in right ventricular wall thickness. Right ventricular systolic function is normal. Left Atrium: Left atrial size was normal in size. No left atrial/left atrial appendage thrombus was detected. Right Atrium: Right atrial size was normal in size. Pericardium: There is no evidence of pericardial effusion. Mitral Valve: The mitral valve is normal in structure. Trivial mitral valve regurgitation. Tricuspid Valve: The tricuspid valve is grossly normal. Tricuspid valve regurgitation is trivial. Aortic Valve: The aortic valve is calcified. Aortic valve regurgitation is trivial. There is no evidence of aortic valve vegetation. Pulmonic Valve: The pulmonic valve was not well visualized. Pulmonic valve regurgitation is not visualized. Aorta: The aortic root is normal in size and structure. IAS/Shunts: No atrial level shunt detected by color flow Doppler. Agitated saline contrast was given intravenously to evaluate for intracardiac shunting. Bartholome Bill MD Electronically signed by Bartholome Bill MD Signature Date/Time: 09/15/2020/1:28:53 PM    Final    [place] EKG SITE RITE  Result Date: 09/15/2020 If Site Rite image not attached, placement could not be confirmed due to current cardiac rhythm.   Microbiology: Recent Results (from the past 240 hour(s))  Culture, blood (Routine X 2) w Reflex to ID Panel     Status: Abnormal   Collection Time: 09/09/20  5:58 PM   Specimen: BLOOD  Result Value Ref Range Status   Specimen Description   Final    BLOOD BLOOD LEFT HAND Performed at [facility], [address] [place], [place] [address]    Special  Requests   Final    BOTTLES DRAWN AEROBIC AND ANAEROBIC Blood Culture adequate volume Performed at [facility], [address], [place], [place] [address]    Culture  Setup Time   Final    GRAM POSITIVE COCCI IN BOTH AEROBIC AND ANAEROBIC BOTTLES CRITICAL VALUE NOTED.  VALUE IS CONSISTENT WITH PREVIOUSLY REPORTED AND CALLED VALUE. Performed at [facility], [address] [place], [place] [address]    Culture (A)  Final    STAPHYLOCOCCUS EPIDERMIDIS SUSCEPTIBILITIES PERFORMED ON PREVIOUS CULTURE WITHIN THE LAST 5 DAYS. Performed at [facility], [place] [address] [place], [place] [address]    Report Status 09/13/2020 FINAL  Final  Culture, blood (Routine X 2) w Reflex to ID Panel     Status: Abnormal   Collection Time: 09/09/20  5:58 PM   Specimen: BLOOD  Result Value Ref Range Status   Specimen Description   Final    BLOOD RIGHT ANTECUBITAL Performed at [facility], [address], [place], [place] [address]    Special Requests   Final    BOTTLES DRAWN AEROBIC AND ANAEROBIC Blood Culture adequate volume Performed at [facility], [address], [place], [place] [address]    Culture  Setup Time   Final    GRAM POSITIVE COCCI IN BOTH AEROBIC AND ANAEROBIC BOTTLES Organism ID to follow CRITICAL RESULT CALLED TO, READ BACK BY AND VERIFIED WITHDarrin Luis, PHAR 1644 09/10/2020 DB  Performed at [facility], [address] [place], [place] [address]    Culture STAPHYLOCOCCUS EPIDERMIDIS (A)  Final   Report Status 09/13/2020 FINAL  Final   Organism ID, Bacteria STAPHYLOCOCCUS EPIDERMIDIS  Final      Susceptibility   Staphylococcus epidermidis - MIC*    CIPROFLOXACIN 2 INTERMEDIATE Intermediate     ERYTHROMYCIN <=0.25 SENSITIVE Sensitive     GENTAMICIN <=0.5 SENSITIVE Sensitive     OXACILLIN RESISTANT Resistant     TETRACYCLINE <=1 SENSITIVE Sensitive     VANCOMYCIN <=0.5 SENSITIVE Sensitive     TRIMETH/SULFA <=10  SENSITIVE Sensitive     CLINDAMYCIN <=0.25 SENSITIVE Sensitive     RIFAMPIN <=0.5 SENSITIVE Sensitive     Inducible Clindamycin NEGATIVE Sensitive     * STAPHYLOCOCCUS EPIDERMIDIS  Blood Culture ID Panel (Reflexed)     Status: Abnormal   Collection Time: 09/09/20  5:58 PM  Result Value Ref Range Status   Enterococcus faecalis NOT DETECTED NOT DETECTED Final   Enterococcus Faecium NOT DETECTED NOT DETECTED Final   Listeria monocytogenes NOT DETECTED NOT DETECTED Final   Staphylococcus species DETECTED (A) NOT DETECTED Final    Comment: CRITICAL RESULT CALLED TO, READ BACK BY AND VERIFIED WITH: Sander Radon 1644 09/10/2020 DB    Staphylococcus aureus (BCID) NOT DETECTED NOT DETECTED Final   Staphylococcus epidermidis DETECTED (A) NOT DETECTED Final    Comment: Methicillin (oxacillin) resistant coagulase negative staphylococcus. Possible blood culture contaminant (unless isolated from more than one blood culture draw or clinical case suggests pathogenicity). No antibiotic treatment is indicated for blood  culture contaminants. CRITICAL RESULT CALLED TO, READ BACK BY AND VERIFIED WITH: BRANDON BEERS, PHAR 1644 09/10/2020 DB    Staphylococcus lugdunensis NOT DETECTED NOT DETECTED Final   Streptococcus species NOT DETECTED NOT DETECTED Final   Streptococcus agalactiae NOT DETECTED NOT DETECTED Final   Streptococcus pneumoniae NOT DETECTED NOT DETECTED Final   Streptococcus pyogenes NOT DETECTED NOT DETECTED Final   A.calcoaceticus-baumannii NOT DETECTED NOT DETECTED Final   Bacteroides fragilis NOT DETECTED NOT DETECTED Final   Enterobacterales NOT DETECTED NOT DETECTED Final   Enterobacter cloacae complex NOT DETECTED NOT DETECTED Final   Escherichia coli NOT DETECTED NOT DETECTED Final   Klebsiella aerogenes NOT DETECTED NOT DETECTED Final   Klebsiella oxytoca NOT DETECTED NOT DETECTED Final   Klebsiella pneumoniae NOT DETECTED NOT DETECTED Final   Proteus species NOT DETECTED NOT  DETECTED Final   Salmonella species NOT DETECTED NOT DETECTED Final   Serratia marcescens NOT DETECTED NOT DETECTED Final   Haemophilus influenzae NOT DETECTED NOT DETECTED Final   Neisseria meningitidis NOT DETECTED NOT DETECTED Final   Pseudomonas aeruginosa NOT DETECTED NOT DETECTED Final   Stenotrophomonas maltophilia NOT DETECTED NOT DETECTED Final   Candida albicans NOT DETECTED NOT DETECTED Final   Candida auris NOT DETECTED NOT DETECTED Final   Candida glabrata NOT DETECTED NOT DETECTED Final   Candida krusei NOT DETECTED NOT DETECTED Final   Candida parapsilosis NOT DETECTED NOT DETECTED Final   Candida tropicalis NOT DETECTED NOT DETECTED Final   Cryptococcus neoformans/gattii NOT DETECTED NOT DETECTED Final   Methicillin resistance mecA/C DETECTED (A) NOT DETECTED Final    Comment: CRITICAL RESULT CALLED TO, READ BACK BY AND VERIFIED WITH: Sander Radon 1644 09/10/2020 DB Performed at [facility], [address], [place], [place] [address]   Urine Culture     Status: None   Collection Time: 09/10/20 10:43 AM   Specimen: Urine, Random  Result Value Ref Range Status   Specimen Description   Final    URINE, RANDOM Performed at [facility], [address], [place], [place] [address]    Special Requests   Final    NONE Performed at [facility], [address] [place], [place] [address]    Culture   Final    NO GROWTH Performed at [facility], [place] [address], [place], [place] [address]    Report Status 09/12/2020 FINAL  Final  CULTURE, BLOOD (ROUTINE X 2) w Reflex to ID Panel     Status: None   Collection Time: 09/10/20  7:02 PM   Specimen: BLOOD  Result Value Ref Range Status   Specimen Description BLOOD BLOOD RIGHT HAND  Final   Special Requests   Final    BOTTLES DRAWN AEROBIC AND ANAEROBIC Blood Culture adequate volume   Culture   Final    NO GROWTH 5 DAYS Performed at [facility], [address]  [place], [place] [address]    Report Status 09/15/2020 FINAL  Final  CULTURE, BLOOD (ROUTINE X 2) w Reflex to ID Panel     Status: None   Collection Time: 09/10/20  7:04 PM   Specimen: BLOOD  Result Value Ref Range Status   Specimen Description BLOOD RIGHT ANTECUBITAL  Final   Special Requests   Final    BOTTLES DRAWN AEROBIC AND ANAEROBIC Blood Culture adequate volume   Culture   Final    NO GROWTH 5 DAYS Performed at [facility], [address] [place], [place] [address]    Report Status 09/15/2020 FINAL  Final     Labs: Basic Metabolic Panel: Recent Labs  Lab 09/12/20 0418 09/12/20 1012 09/13/20 0456 09/14/20 0516 09/15/20 0620 09/16/20 0537  NA 141  --  139 139 137 138  K 4.0  --  4.0 4.2 4.3 4.2  CL 108  --  108 106 104 104  CO2 22  --  _0 GLUCOSE 78  --  100* 165* 180* 152*  BUN 16  --  _1 CREATININE 0.69  --  0.71 0.70 0.70 0.74  CALCIUM 8.3*  --  8.6* 8.6* 8.6* 8.8*  MG  --  1.8 1.9 1.9 1.8 2.0  PHOS  --  2.9 3.4 3.4 3.2 3.9   Liver Function Tests: Recent Labs  Lab 09/12/20 0418 09/13/20 0456 09/14/20 0516 09/15/20 0620 09/16/20 0537  AST _2 ALT _3 ALKPHOS 46 52 64 68 56  BILITOT 0.9 1.1 1.0 0.9 0.9  PROT 5.7* 6.4* 6.3* 6.3* 6.4*  ALBUMIN 2.8* 3.0* 2.9* 2.9* 3.0*   No results for input(s): LIPASE, AMYLASE in the last 168 hours. No results for input(s): AMMONIA in the last 168 hours. CBC: Recent Labs  Lab 09/12/20 1012 09/13/20 0456 09/14/20 0516 09/15/20 0620 09/16/20 0537  WBC 6.4 6.8 5.9 7.2 7.0  NEUTROABS 4.0 3.8 3.1 4.2 3.9  HGB 12.4* 12.1* 11.7* 11.9* 12.1*  HCT 37.2* 36.6* 35.6* 35.7* 36.4*  MCV 96.9 96.6 96.7 97.5 97.6  PLT 226 246 252 269 278   Cardiac Enzymes: No results for input(s): CKTOTAL, CKMB, CKMBINDEX, TROPONINI in the last 168 hours. BNP: BNP (last 3 results) Recent Labs    09/09/20 1144 09/09/20 1758  BNP 301.3* 356.9*    ProBNP (last 3 results) No results  for input(s): PROBNP in the last 8760  hours.  CBG: Recent Labs  Lab 09/15/20 1154 09/15/20 1707 09/15/20 2010 09/16/20 0848 09/16/20 1146  GLUCAP 82 136* 130* 173* 233*       Signed:  Dia Crawford, MD [facility] [phone] pager

## 2020-09-16 NOTE — Progress Notes (Signed)
PHARMACY CONSULT NOTE FOR:  OUTPATIENT  PARENTERAL ANTIBIOTIC THERAPY (OPAT)  Indication: MRSE bacteremia Regimen: vancomycin 1250mg  IV q12h End date: 10/14/2020  IV antibiotic discharge orders are pended. To discharging provider:  please sign these orders via discharge navigator,  Select New Orders & click on the button choice - Manage This Unsigned Work.     Thank you for allowing pharmacy to be a part of this patient's care.  Doreene Eland, PharmD, BCPS.   Work Cell: [phone] 09/16/2020 11:55 AM

## 2020-09-16 NOTE — Treatment Plan (Signed)
Diagnosis: MRSE bacteremia Baseline Creatinine <1   No Known Allergies  OPAT Orders Discharge antibiotics: Vancomycin 1250mg  Q 12 IVPB until 10/14/20 Per pharmacy protocol Aim for Vancomycin trough 15-20 (unless otherwise indicated)  [facility] Care Per Protocol:  Labs Monday  weekly while on IV antibiotics: _X_ CBC with differential  X_ CMP X_ Vancomycin trough Labs every Thursday XVAnco trough X BMP  _X_ Please pull PIC at completion of IV antibiotics  Fax weekly labs to [phone]  Clinic Follow Up Appt:10/14/20 at 11.50am   Call [phone] if need to change or with any questions

## 2020-09-16 NOTE — Anesthesia Postprocedure Evaluation (Signed)
Anesthesia Post Note  Patient: Mathew Baker  Procedure(s) Performed: TRANSESOPHAGEAL ECHOCARDIOGRAM (TEE) (N/A )  Patient location during evaluation: PACU Anesthesia Type: General Level of consciousness: awake and alert and oriented Pain management: pain level controlled Vital Signs Assessment: post-procedure vital signs reviewed and stable Respiratory status: spontaneous breathing Cardiovascular status: blood pressure returned to baseline Anesthetic complications: no   No complications documented.   Last Vitals:  Vitals:   09/16/20 0011 09/16/20 0217  BP: (!) 153/71 128/81  Pulse: 60 (!) 54  Resp: 18 20  Temp: 36.5 C 37 C  SpO2: 97% 98%    Last Pain:  Vitals:   09/16/20 0217  TempSrc: Oral  PainSc:                  [name]

## 2020-09-16 NOTE — Progress Notes (Signed)
Peripherally Inserted Central Catheter Placement  The IV Nurse has discussed with the patient and/or persons authorized to consent for the patient, the purpose of this procedure and the potential benefits and risks involved with this procedure.  The benefits include less needle sticks, lab draws from the catheter, and the patient may be discharged home with the catheter. Risks include, but not limited to, infection, bleeding, blood clot (thrombus formation), and puncture of an artery; nerve damage and irregular heartbeat and possibility to perform a PICC exchange if needed/ordered by physician.  Alternatives to this procedure were also discussed.  Bard Power PICC patient education guide, fact sheet on infection prevention and patient information card has been provided to patient /or left at bedside.    PICC Placement Documentation  PICC Single Lumen 93/79/02 PICC Right Basilic 42 cm 0 cm (Active)  Indication for Insertion or Continuance of Line Home intravenous therapies (PICC only) 09/16/20 1123  Exposed Catheter (cm) 0 cm 09/16/20 1123  Site Assessment Clean;Dry;Intact 09/16/20 1123  Line Status Flushed;Blood return noted 09/16/20 1123  Dressing Type Transparent 09/16/20 1123  Dressing Status Clean;Dry;Intact 09/16/20 1123  Antimicrobial disc in place? Yes 09/16/20 [place] Not Applicable 40/97/35 3299  Dressing Intervention New dressing;Other (Comment) 09/16/20 1123  Dressing Change Due 09/23/20 09/16/20 1123       Christella Noa Albarece 09/16/2020, 11:25 AM

## 2020-09-16 NOTE — Progress Notes (Signed)
Pharmacy Antibiotic Note  Mathew Baker is a 79 y.o. male admitted on 09/09/2020 with hypotension, dizziness, weakness, shortness of breath, and cough consistent with COVID-19 infection. He grew methicillin-resistant staph epi from his blood and is anticipated to need at least 4 weeks of parenteral antibiotics on account of an implanted loop recorder. TEE on 1/10 showed no vegetation, but aortic valve was calcified. Patient initiated vancomycin on 1/6 and his renal function has been stable and at apparent baseline. Pharmacy has been consulted for vancomycin dosing.  Plan: Vancomycin was increased to 1250 mg q12hr on 1/9 to aim for a goal AUC of 400-550 and has received 4 doses since this adjustment. ID recommends removal of loop recorder device and placement of PICC line along with continuing vancomycin at this time. Plan to collect vancomycin peak and trough after evening dose 1/11 and adjust dosing as necessary to achieve AUC goal. Monitor renal function. Plan for OPAT is pending.  Height: 6\' 1"  (185.4 cm) Weight: 104.8 kg (231 lb) IBW/kg (Calculated) : 79.9  Temp (24hrs), Avg:98.1 F (36.7 C), Min:97.7 F (36.5 C), Max:98.7 F (37.1 C)  Recent Labs  Lab 09/11/20 0555 09/12/20 0418 09/12/20 1012 09/13/20 0456 09/13/20 2106 09/14/20 0516 09/15/20 0620 09/16/20 0537  WBC 6.2  --  6.4 6.8  --  5.9 7.2 7.0  CREATININE 0.68 0.69  --  0.71  --  0.70 0.70  --   VANCOTROUGH  --   --   --   --   --  11*  --   --   VANCOPEAK  --   --   --   --  18*  --   --   --     Estimated Creatinine Clearance: 93.6 mL/min (by C-G formula based on SCr of 0.7 mg/dL).    No Known Allergies  Antimicrobials this admission: vancomycin 1/6 >> Total planned duration of therapy is at least 4 weeks  Dose adjustments this admission: 1/9 vancomycin 1000 mg q12hr yielded calculated AUC 366 >> 1250mg  q12hr  Microbiology results: 01/04 BCx: 4 of 4 bottles across 2 sites S epidermidis (Mec-A positive) 01/05  BCx: NG x 5 days 01/05 UCx: NG final  Thank you for allowing pharmacy to be a part of this patient's care.  Alden Benjamin 09/16/2020 8:37 AM

## 2020-09-16 NOTE — TOC Transition Note (Signed)
Transition of Care [facility]) - CM/SW Discharge Note   Patient Details  Name: Mathew Baker MRN: [id number] Date of Birth: Aug 24, 1941  [facility] of [facility] CM/SW Contact:  Beverly Sessions, RN Phone Number: 09/16/2020, 2:47 PM   Clinical Narrative:     Patient to discharge home today PICC line was placed.  Pam with [facility] met with wife and daughter to provide education  Corene Cornea with [facility] notified of discharge.  Requested md x2 to enter home health order for RN.  Awaiting order   Final next level of care: [place] Barriers to Discharge: No Barriers Identified   Patient Goals and CMS Choice        Discharge Placement                       Discharge Plan and Services     Post Acute Care Choice: NA                    HH Arranged: RN [place] Agency: [place] ([place]) Date HH Agency Contacted: 09/16/20   Representative spoke with at [place]: [place] ([place]) Interventions     Readmission Risk Interventions No flowsheet data found.

## 2020-09-30 ENCOUNTER — Ambulatory Visit: Payer: Self-pay

## 2020-09-30 NOTE — Telephone Encounter (Signed)
Advised [facility]HI to PULL PICC after last dose on 10/14/20. Tim.Jeani Hawking replied okay.

## 2020-10-13 ENCOUNTER — Telehealth: Payer: Self-pay

## 2020-10-13 NOTE — Telephone Encounter (Signed)
Patient left voicemail requesting someone from the office call back to discuss appt on 2/8. Spoke with patient who had office confused with another appointment. Confirmed he will be coming as scheduled.

## 2020-10-14 ENCOUNTER — Ambulatory Visit: Admission: RE | Admit: 2020-10-14 | Payer: Medicare Other | Source: Home / Self Care | Admitting: Cardiology

## 2020-10-14 ENCOUNTER — Other Ambulatory Visit
Admission: RE | Admit: 2020-10-14 | Discharge: 2020-10-14 | Disposition: A | Discharge status: Home / Self Care | Payer: Medicare Other | Source: Ambulatory Visit | Attending: Cardiology | Admitting: Cardiology

## 2020-10-14 ENCOUNTER — Other Ambulatory Visit: Payer: Self-pay

## 2020-10-14 ENCOUNTER — Ambulatory Visit: Payer: Medicare Other | Attending: Infectious Diseases | Admitting: Infectious Diseases

## 2020-10-14 ENCOUNTER — Encounter: Payer: Self-pay | Admitting: Infectious Diseases

## 2020-10-14 ENCOUNTER — Encounter: Admission: RE | Payer: Self-pay | Source: Home / Self Care

## 2020-10-14 VITALS — BP 155/73 | HR 72 | Temp 98.0°F | Resp 16 | Ht 73.0 in | Wt 231.0 lb

## 2020-10-14 DIAGNOSIS — U071 COVID-19: Secondary | ICD-10-CM | POA: Insufficient documentation

## 2020-10-14 DIAGNOSIS — Z01812 Encounter for preprocedural laboratory examination: Secondary | ICD-10-CM | POA: Diagnosis present

## 2020-10-14 DIAGNOSIS — Z1629 Resistance to other single specified antibiotic: Secondary | ICD-10-CM

## 2020-10-14 DIAGNOSIS — R7881 Bacteremia: Secondary | ICD-10-CM

## 2020-10-14 DIAGNOSIS — T827XXA Infection and inflammatory reaction due to other cardiac and vascular devices, implants and grafts, initial encounter: Secondary | ICD-10-CM

## 2020-10-14 DIAGNOSIS — B957 Other staphylococcus as the cause of diseases classified elsewhere: Secondary | ICD-10-CM

## 2020-10-14 SURGERY — LOOP RECORDER REMOVAL
Anesthesia: LOCAL

## 2020-10-14 NOTE — Progress Notes (Signed)
NAMEAlbertus Baker  DOB: Oct 19, 1940  MRN: [id number]  Date/Time: 10/14/2020 12:33 PM  REQUESTING PROVIDER Subjective:  REASON FOR CONSULT:  ? Mathew Baker is a 80 y.o. with a history of hypertension, diabetes mellitus, hyperlipidemia, cerebellar stroke was recently in the hospital after having a fall and found to have near syncope.  When he came to the hospital he had some cough for a week and chest tightness.  He did not have any fever.  He tested positive for Covid.  He also had blood culture positive for staph epidermidis in both sets.  He had a loop recorder. Repeat blood culture was immediately negative.  He had a TEE which ruled out any endocarditis.  His loop recorder had to be taken out.  But cardiology decided to remove it as outpatient and he was discharged home on IV vancomycin for 4 weeks.  Today he is here for follow-up.  The loop recorder is not removed yet.  He was scheduled to have it done tomorrow but he has not had a Covid test so the surgery has been postponed.  Today is his last day of IV vancomycin.  He is doing fine.  He does not have any fever or chills or shortness of breath.  Past Medical History:  Diagnosis Date  . Chronic airway obstruction ([place])   . Degeneration of intervertebral disc of lumbar region   . Diabetes mellitus without complication ([place])   . Essential hypertension, benign   . Hyperlipidemia   . Hypertension   . Osteoarthritis of lower extremity   . Pneumonia, organism unspecified(486)   . PSA elevation 07/16/2013   Normal  . Stroke [facility])     Past Surgical History:  Procedure Laterality Date  . CATARACT EXTRACTION W/ INTRAOCULAR LENS  IMPLANT, BILATERAL    . COLONOSCOPY  2006   Normal: Repeat in 10 yrs  . LOOP RECORDER INSERTION N/A 02/21/2020   Procedure: LOOP RECORDER INSERTION;  Surgeon: Isaias Cowman, MD;  Location: [place] [facility];  Service: Cardiovascular;  Laterality: N/A;  . TEE WITHOUT CARDIOVERSION N/A 09/15/2020   Procedure:  TRANSESOPHAGEAL ECHOCARDIOGRAM (TEE);  Surgeon: Teodoro Spray, MD;  Location: [facility];  Service: Cardiovascular;  Laterality: N/A;    Social History   Socioeconomic History  . Marital status: Married    Spouse name: Not on file  . Number of children: Not on file  . Years of education: Not on file  . Highest education level: Not on file  Occupational History  . Not on file  Tobacco Use  . Smoking status: Former Smoker    Quit date: 09/06/1958    Years since quitting: 62.1  . Smokeless tobacco: Never Used  Substance and Sexual Activity  . Alcohol use: No    Alcohol/week: 0.0 standard drinks  . Drug use: No  . Sexual activity: Not on file  Other Topics Concern  . Not on file  Social History Narrative   Married   Retired; [employer] 31 yrs; One of the original [employer] in the early 60's   1 daughter   Social Determinants of Health   Financial Resource Strain: Not on file  Food Insecurity: Not on file  Transportation Needs: Not on file  Physical Activity: Not on file  Stress: Not on file  Social Connections: Not on file  Intimate Partner Violence: Not on file    Family History  Problem Relation Age of Onset  . Diabetes Mother    No Known Allergies  ?  Current Outpatient Medications  Medication Sig Dispense Refill  . albuterol (VENTOLIN HFA) 108 (90 Base) MCG/ACT inhaler INHALE 2 PUFFS BY MOUTH FOUR TIMES A DAY AS NEEDED    . Alcohol Swabs (ALCOHOL WIPES) 70 % PADS USE FOR INSULIN INJECTIONS OR TO FINGER STICK AS DIRECTED BY YOUR MEDICAL PROVIDER    . amLODipine (NORVASC) 10 MG tablet TAKE ONE TABLET BY MOUTH DAILY FOR HEART AND BLOOD PRESSURE    . ascorbic acid (VITAMIN C) 500 MG tablet Take 1 tablet (500 mg total) by mouth daily. 30 tablet 0  . aspirin EC 81 MG tablet Take 81 mg by mouth at bedtime. Swallow whole.    Marland Kitchen atorvastatin (LIPITOR) 80 MG tablet Take 80 mg by mouth daily.    Marland Kitchen dextromethorphan-guaiFENesin (MUCINEX DM) 30-600 MG 12hr tablet Take 1 tablet by  mouth 2 (two) times daily as needed for cough. 30 tablet 0  . ELIQUIS 5 MG TABS tablet Take 5 mg by mouth every 12 (twelve) hours.    . insulin aspart (NOVOLOG) 100 UNIT/ML injection Inject 0-9 Units into the skin 3 (three) times daily with meals. (Patient taking differently: Inject 0-6 Units into the skin 3 (three) times daily as needed for high blood sugar.) 10 mL 11  . insulin glargine (LANTUS) 100 UNIT/ML injection Inject 70 Units into the skin 2 (two) times daily.     Marland Kitchen ipratropium (ATROVENT HFA) 17 MCG/ACT inhaler Inhale 2 puffs into the lungs every 6 (six) hours. 1 each 0  . latanoprost (XALATAN) 0.005 % ophthalmic solution Place 1 drop into both eyes at bedtime.    . tamsulosin (FLOMAX) 0.4 MG CAPS capsule Take 0.4 mg by mouth at bedtime.    . vancomycin IVPB Inject 1,250 mg into the vein every 12 (twelve) hours for 28 days. Indication: MRSE bacteremia First Dose: Yes Last Day of Therapy:  10/14/2020 Please check vancomycin trough and BMP 09/17/2020 Labs - Monday:  CBC/D, CMP, and vancomycin trough. Labs - Thursday (If vancomycin trough and BMP done 09/17/2020, then start 09/25/2020):  BMP and vancomycin trough Please remove PICC upon completion of antibiotics Method of administration:Elastomeric Method of administration may be changed at the discretion of the patient and/or caregiver's ability to self-administer the medication ordered. 57 Units 0  . vitamin B-12 (CYANOCOBALAMIN) 100 MCG tablet Take 100 mcg by mouth daily.    Marland Kitchen zinc sulfate 220 (50 Zn) MG capsule Take 1 capsule (220 mg total) by mouth daily. 30 capsule 0   No current facility-administered medications for this visit.     Abtx:  Anti-infectives (From admission, onward)   None      REVIEW OF SYSTEMS:  Const: negative fever, negative chills, negative weight loss Eyes: negative diplopia or visual changes, negative eye pain ENT: negative coryza, negative sore throat Resp: negative cough, hemoptysis, dyspnea Cards:  negative for chest pain, palpitations, lower extremity edema GU: negative for frequency, dysuria and hematuria GI: Negative for abdominal pain, diarrhea, bleeding, constipation Skin: negative for rash and pruritus Heme: negative for easy bruising and gum/nose bleeding MS: negative for myalgias, arthralgias, back pain and muscle weakness Neurolo:negative for headaches, dizziness, vertigo, memory problems  Psych: negative for feelings of anxiety, depression  Endocrine: negative for thyroid, diabetes Allergy/Immunology- negative for any medication or food allergies ?  Objective:  VITALS:  BP (!) 155/73   Pulse 72   Temp 98 F (36.7 C) (Oral)   Resp 16   Ht _0  (1.854 m)   Wt 231 lb (  104.8 kg)   SpO2 98%   BMI 30.48 kg/m  PHYSICAL EXAM:  General: Alert, cooperative, no distress, appears stated age.  Head: Normocephalic, without obvious abnormality, atraumatic. Eyes: Conjunctivae clear, anicteric sclerae. Pupils are equal ENT Nares normal. No drainage or sinus tenderness. Lips, mucosa, and tongue normal. No Thrush Neck: Supple, symmetrical, no adenopathy, thyroid: non tender no carotid bruit and no JVD. Back: No CVA tenderness. Lungs: Clear to auscultation bilaterally. No Wheezing or Rhonchi. No rales. Heart: Regular rate and rhythm, no murmur, rub or gallop. Abdomen: Soft, non-tender,not distended. Bowel sounds normal. No masses Extremities: Right PICC line.  Atraumatic, no cyanosis. No edema. No clubbing Skin: No rashes or lesions. Or bruising Lymph: Cervical, supraclavicular normal. Neurologic: Grossly non-focal Pertinent Labs Recent labs from 10/13/2020 reviewed.  Creatinine less than 1, ESR normal CRP normal Vanco trough 14   ? Impression/Recommendation ? ?Staff epidermidis bacteremia in both sets.  Repeat blood culture was negative.  TEE was negative for endocarditis.  He is completing 4 weeks of IV vancomycin today.  .  The PICC line will be removed tomorrow.  The  loop recorder is still not been removed.  Discussed with Dr. Ubaldo Glassing.  Patient is going to have a Covid test today.  The loop recorder will be removed sometime soon.  The plan is to repeat another blood culture a week after removal of the loop recorder.   ? ___________________________________________________ Discussed with patient in great detail.,  Communicated with Dr. Ubaldo Glassing his cardiologist Note:  This document was prepared using Dragon voice recognition software and may include unintentional dictation errors.

## 2020-10-15 LAB — SARS CORONAVIRUS 2 (TAT 6-24 HRS): SARS Coronavirus 2: POSITIVE — AB

## 2020-10-16 ENCOUNTER — Telehealth: Payer: Self-pay

## 2020-10-16 NOTE — Telephone Encounter (Signed)
Called to discuss with patient about COVID-19 symptoms and the use of one of the available treatments for those with mild to moderate Covid symptoms and at a high risk of hospitalization.  Pt appears to qualify for outpatient treatment due to co-morbid conditions and/or a member of an at-risk group in accordance with the FDA Emergency Use Authorization.    Symptom onset: Reports no symptoms Vaccinated: Yes Booster? No Immunocompromised? No Qualifiers: HTN,Diabetes  Pt. Reports he had COVID 46 in January. Test was for pre-admission, and showing positive. No symptoms.  Mathew Baker

## 2020-10-23 ENCOUNTER — Encounter: Payer: Self-pay | Admitting: Cardiology

## 2020-10-23 ENCOUNTER — Encounter
Admission: RE | Disposition: A | Discharge status: Home / Self Care | Payer: Self-pay | Source: Home / Self Care | Attending: Cardiology

## 2020-10-23 ENCOUNTER — Ambulatory Visit
Admission: RE | Admit: 2020-10-23 | Discharge: 2020-10-23 | Disposition: A | Discharge status: Home / Self Care | Payer: Medicare Other | Attending: Cardiology | Admitting: Cardiology

## 2020-10-23 ENCOUNTER — Other Ambulatory Visit: Payer: Self-pay

## 2020-10-23 DIAGNOSIS — Z7982 Long term (current) use of aspirin: Secondary | ICD-10-CM | POA: Diagnosis not present

## 2020-10-23 DIAGNOSIS — Z8616 Personal history of COVID-19: Secondary | ICD-10-CM | POA: Diagnosis not present

## 2020-10-23 DIAGNOSIS — R7881 Bacteremia: Secondary | ICD-10-CM | POA: Insufficient documentation

## 2020-10-23 DIAGNOSIS — Y710 Diagnostic and monitoring cardiovascular devices associated with adverse incidents: Secondary | ICD-10-CM | POA: Insufficient documentation

## 2020-10-23 DIAGNOSIS — B957 Other staphylococcus as the cause of diseases classified elsewhere: Secondary | ICD-10-CM | POA: Diagnosis not present

## 2020-10-23 DIAGNOSIS — Z7901 Long term (current) use of anticoagulants: Secondary | ICD-10-CM | POA: Diagnosis not present

## 2020-10-23 DIAGNOSIS — Z1611 Resistance to penicillins: Secondary | ICD-10-CM | POA: Diagnosis not present

## 2020-10-23 DIAGNOSIS — T827XXA Infection and inflammatory reaction due to other cardiac and vascular devices, implants and grafts, initial encounter: Secondary | ICD-10-CM | POA: Insufficient documentation

## 2020-10-23 DIAGNOSIS — Z794 Long term (current) use of insulin: Secondary | ICD-10-CM | POA: Insufficient documentation

## 2020-10-23 DIAGNOSIS — Z79899 Other long term (current) drug therapy: Secondary | ICD-10-CM | POA: Insufficient documentation

## 2020-10-23 HISTORY — PX: LOOP RECORDER REMOVAL: EP1215

## 2020-10-23 LAB — GLUCOSE, CAPILLARY: Glucose-Capillary: 169 mg/dL — ABNORMAL HIGH (ref 70–99)

## 2020-10-23 SURGERY — LOOP RECORDER REMOVAL
Anesthesia: Moderate Sedation

## 2020-10-23 MED ORDER — LIDOCAINE-EPINEPHRINE (PF) 1 %-1:200000 IJ SOLN
INTRAMUSCULAR | Status: AC
  Filled 2020-10-23: qty 20

## 2020-10-23 SURGICAL SUPPLY — 1 items: PACK LOOP INSERTION (CUSTOM PROCEDURE TRAY) ×2 IMPLANT

## 2020-10-23 NOTE — Discharge Instructions (Signed)
Leave bandage and Steri-Strips on.  Patient may shower 10/24/2020.

## 2020-11-18 NOTE — Progress Notes (Signed)
Interval Note:   The H&P by Dr. Delaine Lame on 10/14/2020 was reviewed and no changes are recommended.

## 2020-11-18 NOTE — Progress Notes (Deleted)
Interval Note:  The H&P by Dr. Delaine Lame on 10/14/2020 was reviewed and no changes are recommended.

## 2021-02-14 IMAGING — MR MRI HEAD WITHOUT AND WITH CONTRAST
12 of 14 series · 38 of 48 positions shown · IV contrast ([facility])
Comparison: 03/20/2019 CT head.  03/21/2019 carotid ultrasound.

CLINICAL DATA: 78 y/o  M; nausea and vomiting.

EXAM:
MRI HEAD WITHOUT CONTRAST
MRA HEAD WITHOUT CONTRAST
TECHNIQUE: Multiplanar, multiecho pulse sequences of the brain and surrounding
structures were obtained without intravenous contrast. Angiographic
images of the head were obtained using MRA technique without
contrast.

[Series 5: ax dwi_tracew · axial · 3.0mm · 0.60mm/px · z∈[-86,+63]mm · 3 of 48 slices shown]
[im 1/48]
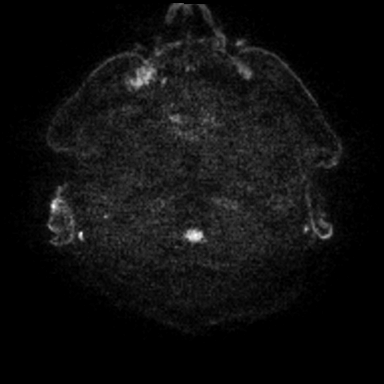
[im 24/48]
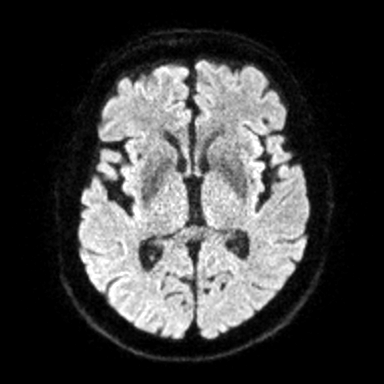
[im 48/48]
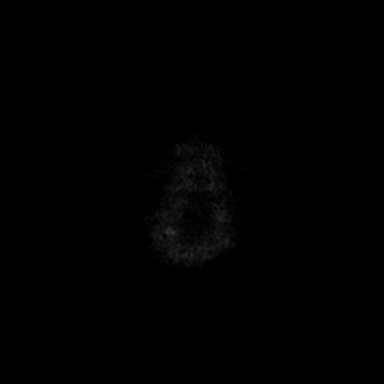

[Series 6: ax dwi_adc · axial · 3.0mm · 0.60mm/px · z∈[-86,+60]mm · 3 of 47 slices shown]
[im 1/47]
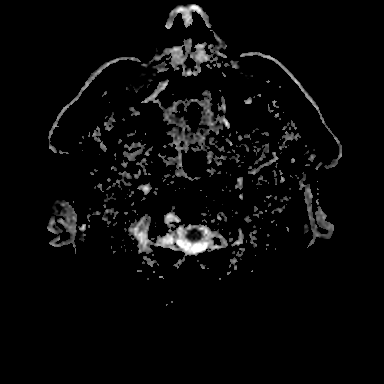
[im 24/47]
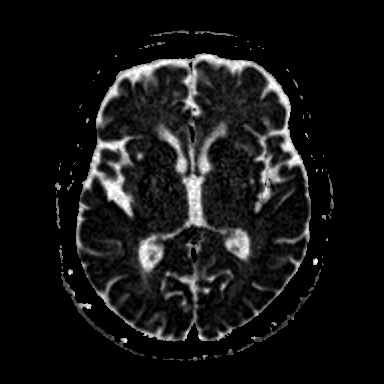
[im 47/47]
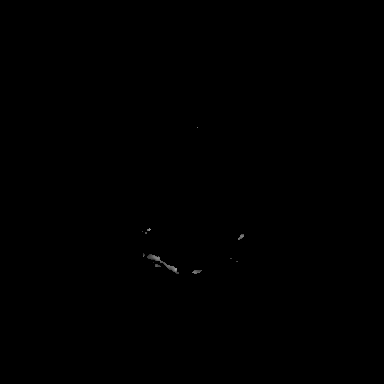

[Series 7: cor dwi_tracew · coronal · 5.0mm · 0.60mm/px · 2 of 38 slices shown]
[im 1/38]
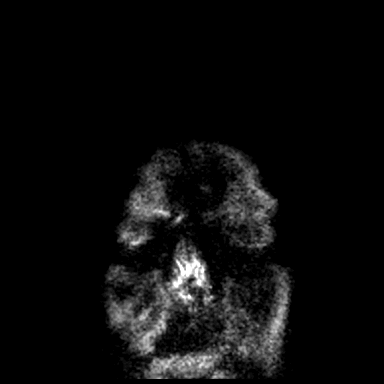
[im 38/38]
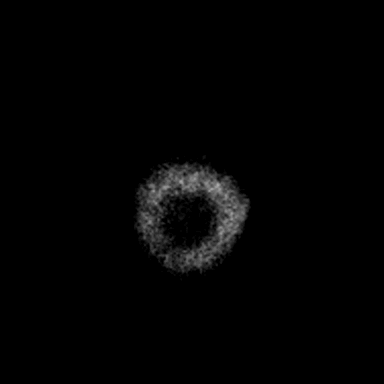

[Series 8: cor dwi_adc · coronal · 5.0mm · 0.60mm/px · 2 of 38 slices shown]
[im 1/38]
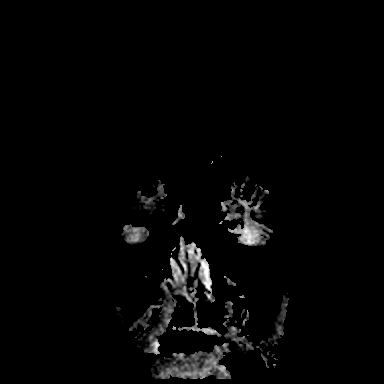
[im 38/38]
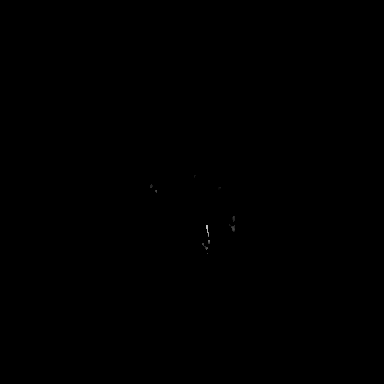

[Series 9: tof_cs_(id) · axial · 0.5mm · 0.48mm/px · z∈[-60,-12]mm · 5 of 168 slices shown]
[im 1/168]
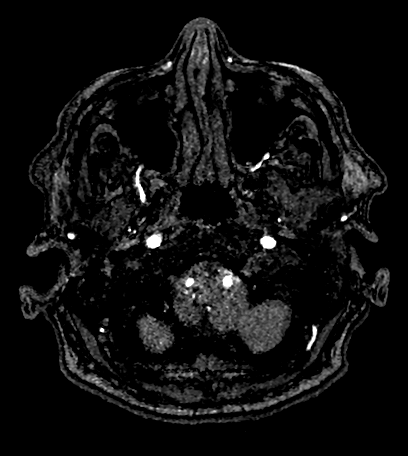
[im 21/168]
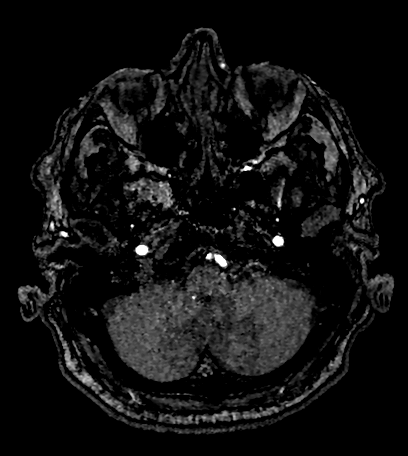
[im 42/168]
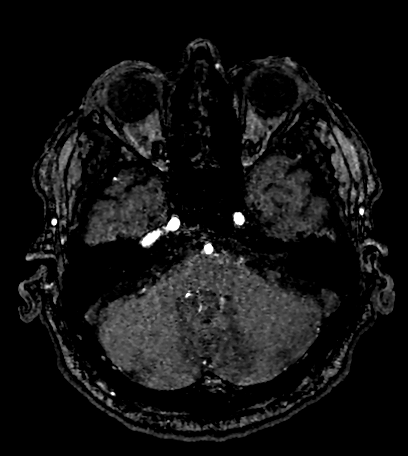
[im 63/168]
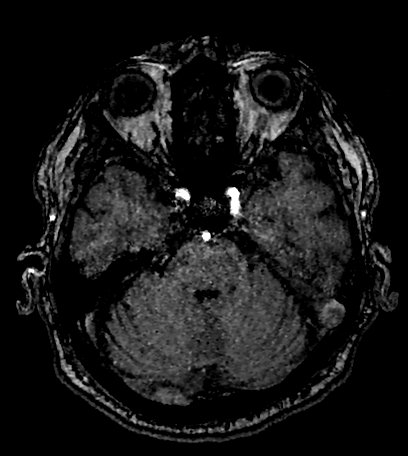
[im 105/168]
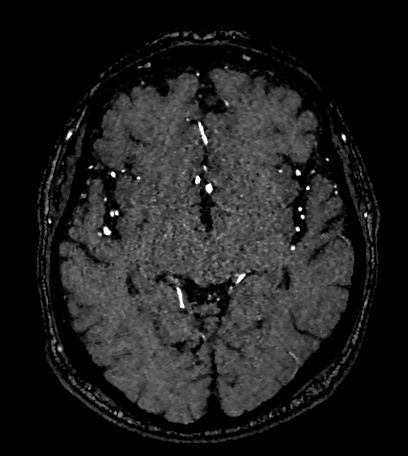

[Series 14: T1 · sagittal · 5.0mm · 0.62mm/px · 1 of 23 slices shown (1 of 2)]
[im 1/23]
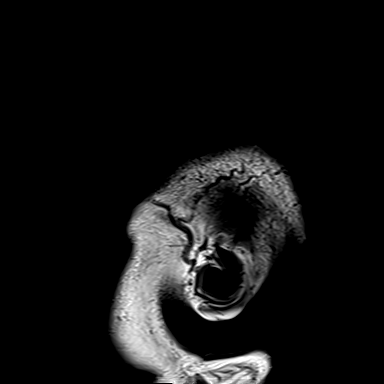

[Series 15: T2 · axial · 5.0mm · 0.53mm/px · 1 of 27 slices shown]
[im 1/27]
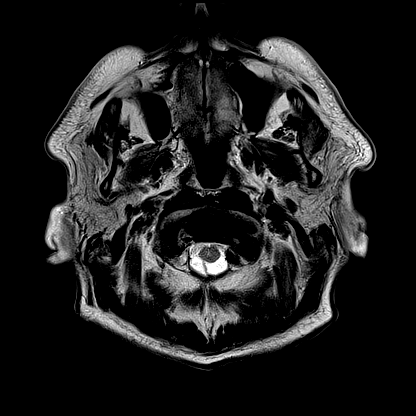

[Series 20: FLAIR · axial · 3.0mm · 0.53mm/px · z∈[-86,+70]mm · 3 of 55 slices shown]
[im 1/55]
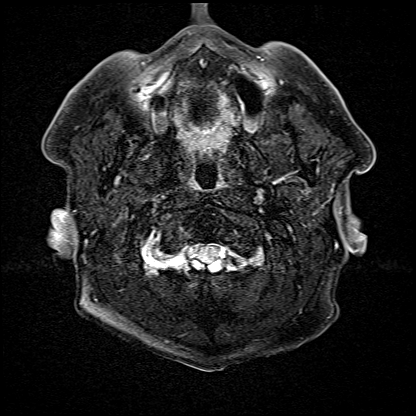
[im 28/55]
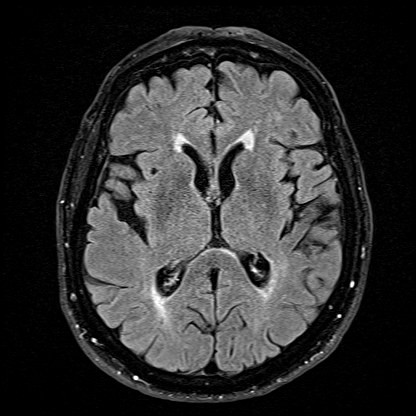
[im 55/55]
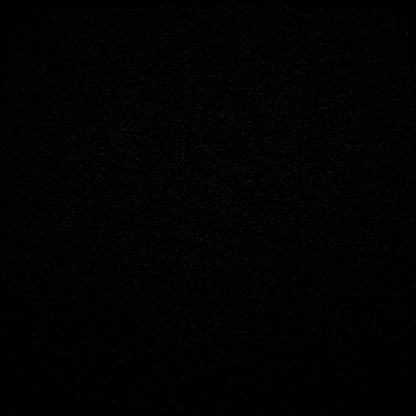

[Series 21: T1 · axial · 1.0mm · 0.98mm/px · z∈[-85,+67]mm · 8 of 159 slices shown (2 of 2)]
[im 1/159]
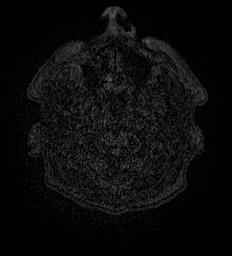
[im 23/159]
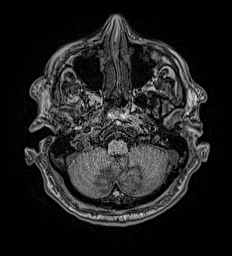
[im 46/159]
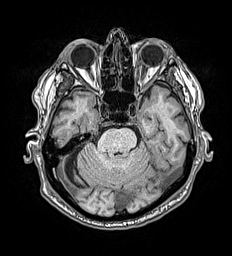
[im 68/159]
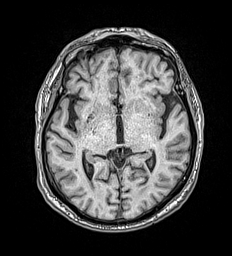
[im 91/159]
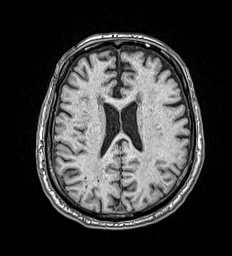
[im 113/159]
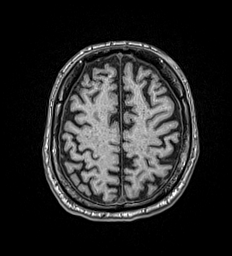
[im 136/159]
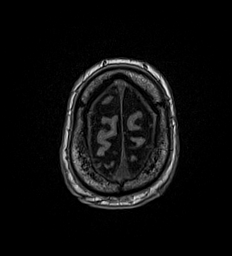
[im 159/159]
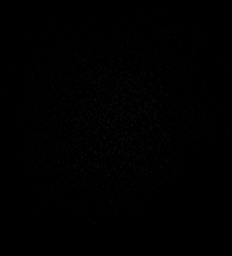

[Series 22: T2 post-contrast · coronal · 5.0mm · 0.57mm/px · 1 of 29 slices shown]
[im 1/29]
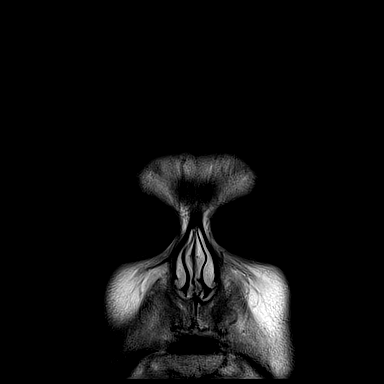

[Series 23: T1 post-contrast · axial · 1.0mm · 0.98mm/px · z∈[-85,+67]mm · 8 of 160 slices shown (1 of 2)]
[im 1/160]
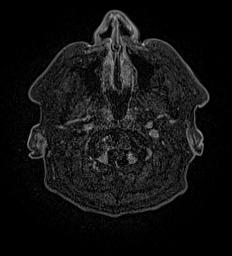
[im 23/160]
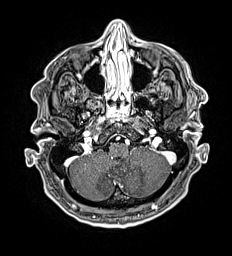
[im 46/160]
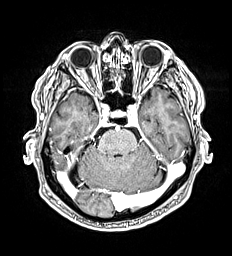
[im 69/160]
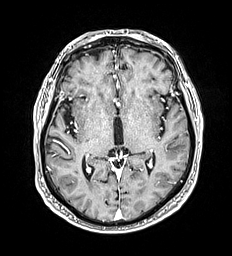
[im 91/160]
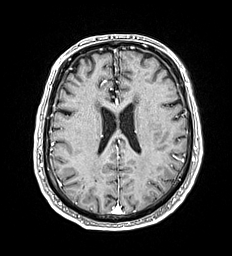
[im 114/160]
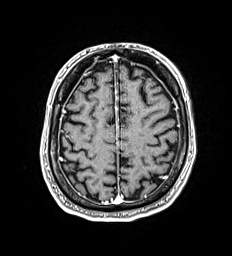
[im 137/160]
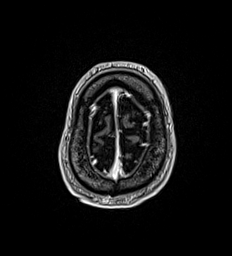
[im 160/160]
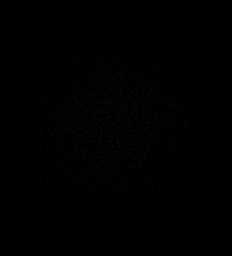

[Series 24: T1 post-contrast · coronal · 5.0mm · 0.57mm/px · 1 of 29 slices shown (2 of 2)]
[im 1/29]
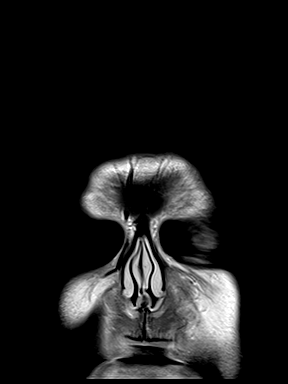

[38 of 48 positions shown; findings below may reference images not displayed]

FINDINGS: MRI HEAD FINDINGS

Brain: Reduced diffusion of cerebellar vermis and the medial
inferior cerebellar hemispheres of bilaterally compatible with
acute/early subacute infarction. There is mild local mass effect
with partial effacement of the fourth ventricle and increased T2
FLAIR signal. There is confluent petechial hemorrhage throughout the
infarct on susceptibility weighted sequences. There is faint linear
enhancement within the area of infarction likely represent small
vessel structures. No masslike enhancement.

No additional findings for stroke, hemorrhage, extra-axial
collection, hydrocephalus, or herniation. Punctate nonspecific T2
FLAIR hyperintensities in subcortical and periventricular white
matter are compatible with mild chronic microvascular ischemic
changes. Mild volume loss of the brain.

Vascular: There is increased susceptibility hypointensity throughout
the area

Skull and upper cervical spine: Normal marrow signal.

Sinuses/Orbits: Mild mucosal thickening of the ethmoid air cells.
Additional included paranasal sinuses and mastoid air cells
demonstrate normal signal. Bilateral intra-ocular lens replacement.

Other: None.

MRA HEAD FINDINGS

Internal carotid arteries:  Patent.

Anterior cerebral arteries:  Patent.

Middle cerebral arteries: Patent.

Anterior communicating artery: Patent.

Posterior communicating arteries:  Patent.

Posterior cerebral arteries:  Patent.

Basilar artery:  Patent.

Vertebral arteries: Loss of flow related signal within the right
vertebral artery between the PICA origin and the basilar, however
normally enhancing on the contrast enhanced MRI of the brain,
uncertain significance. Patent right PICA. No left PICA identified
within the field of view.
IMPRESSION: MRI head:

1. Acute/early subacute infarction involving the inferior cerebellum
and medial aspect of bilateral cerebellar hemispheres. Positive for
hemorrhage, Quirijn calssification 1b: HI2, confluent petechiae,
no mass effect. Mild local mass effect with partial effacement of
fourth ventricle. No hydrocephalus at this time.
2. Mild chronic microvascular ischemic changes and volume loss of
the brain.

MRA head:

1. Loss of flow related signal within the right vertebral artery
between the PICA origin and the basilar, however, the vessel is
normally enhancing on contrast enhanced MRI of the brain. The
finding is of uncertain significance, possibly artifact.
2. Patent right PICA. No left PICA identified within the field of
view. The stroke may be related to a left dominant PICA occlusion.
CT angiography of the head and neck may better assess vascular
anatomy.
3. Otherwise unremarkable MRA of the head.

These results will be called to the ordering clinician or
representative by the Radiologist Assistant, and communication
documented in the PACS or zVision Dashboard.

## 2021-02-23 ENCOUNTER — Ambulatory Visit: Payer: Medicare Other | Admitting: Gastroenterology

## 2021-03-24 ENCOUNTER — Other Ambulatory Visit: Payer: Self-pay

## 2021-03-24 ENCOUNTER — Ambulatory Visit (INDEPENDENT_AMBULATORY_CARE_PROVIDER_SITE_OTHER): Payer: Medicare Other | Admitting: Gastroenterology

## 2021-03-24 ENCOUNTER — Encounter: Payer: Self-pay | Admitting: Gastroenterology

## 2021-03-24 VITALS — BP 106/56 | HR 71 | Temp 97.6°F | Ht 73.0 in | Wt 246.2 lb

## 2021-03-24 DIAGNOSIS — H4010X4 Unspecified open-angle glaucoma, indeterminate stage: Secondary | ICD-10-CM | POA: Insufficient documentation

## 2021-03-24 DIAGNOSIS — E538 Deficiency of other specified B group vitamins: Secondary | ICD-10-CM

## 2021-03-24 DIAGNOSIS — D649 Anemia, unspecified: Secondary | ICD-10-CM | POA: Diagnosis not present

## 2021-03-24 DIAGNOSIS — D500 Iron deficiency anemia secondary to blood loss (chronic): Secondary | ICD-10-CM | POA: Insufficient documentation

## 2021-03-24 DIAGNOSIS — D509 Iron deficiency anemia, unspecified: Secondary | ICD-10-CM | POA: Insufficient documentation

## 2021-03-24 NOTE — Progress Notes (Signed)
[name] MD, MRCP(U.K) [address]  [place]  [place], [place] [address]  Main: [phone]  Fax: [phone]   Gastroenterology Consultation  Referring Provider:     Barbaraann Faster, MD Primary Care Physician:  Kirk Ruths, MD Primary Gastroenterologist:  Dr. [name]  Reason for Consultation:     Iron deficiency anemia        HPI:   Mathew Baker is a 80 y.o. y/o male referred for consultation & management  by Dr. Ouida Sills, Ocie Cornfield, MD.    He has been referred for iron deficiency anemia.  I cannot find any iron studies to indicate that this patient has iron deficiency.  01/09/2021 hemoglobin 12.2 g with an MCV of 97.  Ferritin of 74 in May 2021.  Per epic last colonoscopy was in 2015 and states that he had 2 tubular adenomas but I cannot see the entire report.  He is on Eliquis.  He denies any nasal bleeds, blood in urine, blood in stool, change in bowel habits, overt GI bleeding of any thoughts.  No other GI complaints.  Past Medical History:  Diagnosis Date   Chronic airway obstruction ([facility])    Degeneration of intervertebral disc of lumbar region    Diabetes mellitus without complication ([place])    Essential hypertension, benign    Hyperlipidemia    Hypertension    Osteoarthritis of lower extremity    Pneumonia, organism unspecified(486)    PSA elevation 07/16/2013   Normal   Stroke [facility])     Past Surgical History:  Procedure Laterality Date   CATARACT EXTRACTION W/ INTRAOCULAR LENS  IMPLANT, BILATERAL     COLONOSCOPY  2006   Normal: Repeat in 10 yrs   LOOP RECORDER INSERTION N/A 02/21/2020   Procedure: LOOP RECORDER INSERTION;  Surgeon: Isaias Cowman, MD;  Location: [place] [facility];  Service: Cardiovascular;  Laterality: N/A;   LOOP RECORDER REMOVAL N/A 10/23/2020   Procedure: LOOP RECORDER REMOVAL;  Surgeon: Isaias Cowman, MD;  Location: [place] [facility];  Service: Cardiovascular;  Laterality: N/A;   TEE WITHOUT  CARDIOVERSION N/A 09/15/2020   Procedure: TRANSESOPHAGEAL ECHOCARDIOGRAM (TEE);  Surgeon: Teodoro Spray, MD;  Location: [facility];  Service: Cardiovascular;  Laterality: N/A;    Prior to Admission medications   Medication Sig Start Date End Date Taking? Authorizing Provider  albuterol (VENTOLIN HFA) 108 (90 Base) MCG/ACT inhaler Inhale 2 puffs into the lungs every 6 (six) hours as needed for wheezing or shortness of breath. 04/28/20   [provider]  Alcohol Swabs (ALCOHOL WIPES) 70 % PADS USE FOR INSULIN INJECTIONS OR TO FINGER STICK AS DIRECTED BY YOUR MEDICAL PROVIDER 04/28/20   [provider]  ascorbic acid (VITAMIN C) 500 MG tablet Take 1 tablet (500 mg total) by mouth daily. 09/17/20   Allie Bossier, MD  aspirin EC 81 MG tablet Take 81 mg by mouth at bedtime. Swallow whole.    [provider]  atorvastatin (LIPITOR) 80 MG tablet Take 80 mg by mouth daily.    [provider]  Dulaglutide 4.5 MG/0.5ML SOPN Inject 4.5 mg into the skin every Monday.    [provider]  ELIQUIS 5 MG TABS tablet Take 5 mg by mouth 2 (two) times daily. 08/18/20   [provider]  insulin aspart (NOVOLOG) 100 UNIT/ML injection Inject 0-9 Units into the skin 3 (three) times daily with meals. Patient taking differently: Inject 0-6 Units into the skin 3 (three) times daily  as needed for high blood sugar. 03/25/19   Epifanio Lesches, MD  insulin glargine (LANTUS) 100 UNIT/ML injection Inject 70 Units into the skin 2 (two) times daily.     [provider]  ipratropium (ATROVENT HFA) 17 MCG/ACT inhaler Inhale 2 puffs into the lungs every 6 (six) hours. 09/16/20   Allie Bossier, MD  latanoprost (XALATAN) 0.005 % ophthalmic solution Place 1 drop into both eyes at bedtime.    [provider]  tamsulosin (FLOMAX) 0.4 MG CAPS capsule Take 0.4 mg by mouth at bedtime.    [provider]  vitamin B-12 (CYANOCOBALAMIN) 100 MCG tablet Take 100 mcg  by mouth daily.    [provider]    Family History  Problem Relation Age of Onset   Diabetes Mother      Social History   Tobacco Use   Smoking status: Former    Types: Cigarettes    Quit date: 09/06/1958    Years since quitting: 62.5   Smokeless tobacco: Never  Substance Use Topics   Alcohol use: No    Alcohol/week: 0.0 standard drinks   Drug use: No    Allergies as of 03/24/2021   (No Known Allergies)    Review of Systems:    All systems reviewed and negative except where noted in HPI.   Physical Exam:  BP (!) 106/56   Pulse 71   Temp 97.6 F (36.4 C) (Oral)   Ht 6\' 1"  (1.854 m)   Wt 246 lb 3.2 oz (111.7 kg)   BMI 32.48 kg/m  No LMP for male patient. Psych:  Alert and cooperative. Normal mood and affect. General:   Alert,  Well-developed, well-nourished, pleasant and cooperative in NAD Head:  Normocephalic and atraumatic. Eyes:  Sclera clear, no icterus.   Conjunctiva pink. Ears:  Normal auditory acuity. Neurologic:  Alert and oriented x3;  grossly normal neurologically. Psych:  Alert and cooperative. Normal mood and affect.  Imaging Studies: No results found.  Assessment and Plan:   Mathew Baker is a 80 y.o. y/o male has been referred for iron deficiency anemia.  I reviewed his labs from May 2022 and he has a normocytic anemia.  No recent iron labs to suggest iron deficiency.  He has a ferritin in 2021 which was in normal range.  I will have his labs checked including iron studies B12 and folate.  If they are within the normal range no further GI work-up will be recommended rather he should probably see a hematologist to rule out any bone marrow disorders such as MDS.  If he does have iron deficiency anemia then he would require endoscopic evaluation.  Follow up in 6 weeks video visit   Dr [name] MD,MRCP(U.K)

## 2021-03-25 LAB — B12 AND FOLATE PANEL
Folate: 11.3 ng/mL (ref >3.0)
Vitamin B-12: 1386 pg/mL — ABNORMAL HIGH (ref 232–1245)

## 2021-03-25 LAB — IRON,TIBC AND FERRITIN PANEL
Ferritin: 70 ng/mL (ref 30–400)
Iron Saturation: 25 % (ref 15–55)
Iron: 73 ug/dL (ref 38–169)
Total Iron Binding Capacity: 288 ug/dL (ref 250–450)
UIBC: 215 ug/dL (ref 111–343)

## 2021-03-30 ENCOUNTER — Telehealth: Payer: Self-pay | Admitting: Gastroenterology

## 2021-03-30 ENCOUNTER — Telehealth: Payer: Self-pay

## 2021-03-30 NOTE — Telephone Encounter (Signed)
Call patient but spoke to his wife Sharyn Lull and told her that his lab results were normal. She didn't have any further questions.

## 2021-03-30 NOTE — Telephone Encounter (Signed)
Patient calling about lab results. 

## 2021-03-30 NOTE — Telephone Encounter (Signed)
-----   Message from [name], MD sent at 03/30/2021 12:44 PM EDT ----- Inform patient and PCP office, we checked his iron labs and all are normal. He does not have iron deficiency anemia, would not need any GI evaluation unless . Suggest to follow up with PCP/hematology if there is a concern for anemia unless he has a history of active bleeding which he denied at office visit . Fax lab results  to PCP office

## 2021-03-31 NOTE — Telephone Encounter (Signed)
The below information was faxed to patient's PCP.

## 2021-05-05 ENCOUNTER — Ambulatory Visit: Payer: Medicare Other | Admitting: Gastroenterology

## 2021-05-13 ENCOUNTER — Ambulatory Visit: Payer: Medicare Other | Admitting: Gastroenterology

## 2021-06-23 ENCOUNTER — Ambulatory Visit: Payer: Medicare Other | Admitting: Gastroenterology

## 2021-08-12 ENCOUNTER — Ambulatory Visit: Payer: Medicare Other | Admitting: Gastroenterology

## 2021-10-27 ENCOUNTER — Other Ambulatory Visit: Payer: Self-pay

## 2021-10-27 ENCOUNTER — Emergency Department
Admission: EM | Admit: 2021-10-27 | Discharge: 2021-10-27 | Disposition: A | Discharge status: Home / Self Care | Payer: Medicare Other | Attending: Emergency Medicine | Admitting: Emergency Medicine

## 2021-10-27 ENCOUNTER — Emergency Department: Payer: Medicare Other

## 2021-10-27 DIAGNOSIS — J441 Chronic obstructive pulmonary disease with (acute) exacerbation: Secondary | ICD-10-CM | POA: Insufficient documentation

## 2021-10-27 DIAGNOSIS — I10 Essential (primary) hypertension: Secondary | ICD-10-CM | POA: Insufficient documentation

## 2021-10-27 DIAGNOSIS — E119 Type 2 diabetes mellitus without complications: Secondary | ICD-10-CM | POA: Insufficient documentation

## 2021-10-27 DIAGNOSIS — R0602 Shortness of breath: Secondary | ICD-10-CM | POA: Diagnosis present

## 2021-10-27 DIAGNOSIS — Z20822 Contact with and (suspected) exposure to covid-19: Secondary | ICD-10-CM | POA: Insufficient documentation

## 2021-10-27 DIAGNOSIS — Z79899 Other long term (current) drug therapy: Secondary | ICD-10-CM | POA: Diagnosis not present

## 2021-10-27 LAB — BASIC METABOLIC PANEL
Anion gap: 8 (ref 5–15)
BUN: 24 mg/dL — ABNORMAL HIGH (ref 8–23)
CO2: 26 mmol/L (ref 22–32)
Calcium: 8.9 mg/dL (ref 8.9–10.3)
Chloride: 104 mmol/L (ref 98–111)
Creatinine, Ser: 1.15 mg/dL (ref 0.61–1.24)
GFR, Estimated: >60 mL/min (ref >60)
Glucose, Bld: 201 mg/dL — ABNORMAL HIGH (ref 70–99)
Potassium: 4.1 mmol/L (ref 3.5–5.1)
Sodium: 138 mmol/L (ref 135–145)

## 2021-10-27 LAB — CBC
HCT: 44.4 % (ref 39.0–52.0)
Hemoglobin: 14.8 g/dL (ref 13.0–17.0)
MCH: 33.3 pg (ref 26.0–34.0)
MCHC: 33.3 g/dL (ref 30.0–36.0)
MCV: 99.8 fL (ref 80.0–100.0)
Platelets: 246 10*3/uL (ref 150–400)
RBC: 4.45 MIL/uL (ref 4.22–5.81)
RDW: 12.5 % (ref 11.5–15.5)
WBC: 10.4 10*3/uL (ref 4.0–10.5)
nRBC: 0 % (ref 0.0–0.2)

## 2021-10-27 LAB — RESP PANEL BY RT-PCR (FLU A&B, COVID) ARPGX2
Influenza A by PCR: NEGATIVE
Influenza B by PCR: NEGATIVE
SARS Coronavirus 2 by RT PCR: NEGATIVE

## 2021-10-27 LAB — TROPONIN I (HIGH SENSITIVITY)
Troponin I (High Sensitivity): 17 ng/L (ref <18)
Troponin I (High Sensitivity): 17 ng/L (ref <18)

## 2021-10-27 MED ORDER — METHYLPREDNISOLONE SODIUM SUCC 125 MG IJ SOLR
125.0000 mg | Freq: Once | INTRAMUSCULAR | Status: AC
  Administered 2021-10-27: 125 mg via INTRAMUSCULAR
  Filled 2021-10-27: qty 2

## 2021-10-27 MED ORDER — DOXYCYCLINE HYCLATE 100 MG PO TABS
100.0000 mg | ORAL_TABLET | Freq: Once | ORAL | Status: AC
  Administered 2021-10-27: 100 mg via ORAL
  Filled 2021-10-27: qty 1

## 2021-10-27 MED ORDER — DOXYCYCLINE HYCLATE 100 MG PO CAPS: 100.0000 mg | ORAL_CAPSULE | Freq: Two times a day (BID) | ORAL | 0 refills | Status: DC

## 2021-10-27 MED ORDER — PREDNISONE 20 MG PO TABS: 40.0000 mg | ORAL_TABLET | Freq: Every day | ORAL | 0 refills | Status: AC

## 2021-10-27 MED ORDER — GUAIFENESIN ER 600 MG PO TB12: 600.0000 mg | ORAL_TABLET | Freq: Two times a day (BID) | ORAL | 0 refills | Status: AC

## 2021-10-27 MED ORDER — DOXYCYCLINE HYCLATE 100 MG PO CAPS: 100.0000 mg | ORAL_CAPSULE | Freq: Two times a day (BID) | ORAL | 0 refills | Status: AC

## 2021-10-27 MED ORDER — IPRATROPIUM-ALBUTEROL 0.5-2.5 (3) MG/3ML IN SOLN
3.0000 mL | Freq: Once | RESPIRATORY_TRACT | Status: AC
  Administered 2021-10-27: 3 mL via RESPIRATORY_TRACT
  Filled 2021-10-27: qty 3

## 2021-10-27 MED ORDER — PREDNISONE 20 MG PO TABS: 40.0000 mg | ORAL_TABLET | Freq: Every day | ORAL | 0 refills | Status: DC

## 2021-10-27 NOTE — ED Provider Notes (Signed)
[facility] Provider Note    Event Date/Time   First MD Initiated Contact with Patient 10/27/21 1314     (approximate)   History   Chest Pain and Shortness of Breath   HPI  Mathew Baker is a 81 y.o. male   with past medical history of COPD, hypertension, hyperlipidemia, A-fib, diabetes, here with shortness of breath.  The patient states that over the last week, he has had progressively worsening cough, some shortness of breath, and sputum production.  He feels like he has had a cold.  Has history of bronchitis with similar symptoms.  He denies any chest pain.  He does state that he has some chronic shortness of breath from his heart and lungs, but this is a longstanding issue.  Has not tried albuterol.  He went to pulmonology today, and was sent here for further evaluation.  Denies any known fevers.  No known sick contacts.  No other medical complaints.      Physical Exam   Triage Vital Signs: ED Triage Vitals  Enc Vitals Group     BP 10/27/21 1008 122/64     Pulse Rate 10/27/21 1008 71     Resp 10/27/21 1008 18     Temp 10/27/21 1008 98.1 F (36.7 C)     Temp Source 10/27/21 1008 Oral     SpO2 10/27/21 1008 94 %     Weight --      Height --      Head Circumference --      Peak Flow --      Pain Score 10/27/21 1016 9     Pain Loc --      Pain Edu? --      Excl. in [place]? --     Most recent vital signs: Vitals:   10/27/21 1244 10/27/21 1523  BP: (!) 103/53 106/60  Pulse: 69 70  Resp: 16 17  Temp:  98.2 F (36.8 C)  SpO2: 100% 98%     General: Awake, no distress.  CV:  Good peripheral perfusion.  Regular rate and rhythm.  No murmurs. Resp:  Normal effort.  Slight expiratory wheeze noted, particularly with prolonged expiration or coughing. Abd:  No distention.  No tenderness. Other:  No lower extremity edema.   ED Results / Procedures / Treatments   Labs (all labs ordered are listed, but only abnormal results are displayed) Labs  Reviewed  BASIC METABOLIC PANEL - Abnormal; Notable for the following components:      Result Value   Glucose, Bld 201 (*)    BUN 24 (*)    All other components within normal limits  RESP PANEL BY RT-PCR (FLU A&B, COVID) ARPGX2  CBC  TROPONIN I (HIGH SENSITIVITY)  TROPONIN I (HIGH SENSITIVITY)     EKG Normal sinus rhythm, ventricular rate 77.  PR 212, QRS 126, QTc 454.  Left axis deviation.  Nonspecific intraventricular block.   RADIOLOGY Chest x-ray: Clear, no pneumonia or pneumothorax   I also independently reviewed and agree wit radiologist interpretations.   PROCEDURES:  Critical Care performed: No   MEDICATIONS ORDERED IN ED: Medications  methylPREDNISolone sodium succinate (SOLU-MEDROL) 125 mg/2 mL injection 125 mg (125 mg Intramuscular Given 10/27/21 1413)  doxycycline (VIBRA-TABS) tablet 100 mg (100 mg Oral Given 10/27/21 1413)  ipratropium-albuterol (DUONEB) 0.5-2.5 (3) MG/3ML nebulizer solution 3 mL (3 mLs Nebulization Given 10/27/21 1412)     IMPRESSION / MDM / ASSESSMENT AND PLAN / ED COURSE  I  reviewed the triage vital signs and the nursing notes.                               Ddx:  COPD exacerbation, bronchitis, atypical pneumonia, CHF, ACS/atypical angina, anemia   MDM:  81 year old male with past medical history as above including COPD and A-fib, hypertension, hyperlipidemia, here with shortness of breath.  Clinically, suspect possible bronchitis versus viral URI in the setting of his COPD.  History of similar presentations.  He has some mild wheezing noted on exam.  EKG shows no acute ischemic changes and troponins are negative x2, making ACS unlikely.  Chest x-ray is clear without evidence of significant pneumonia.  He does not appear septic.  Vital signs are stable.  Labs otherwise very reassuring with no leukocytosis or anemia.  BMP at baseline.  COVID-negative.  Patient feels better after breathing treatment would like to go home.  Given his clinical  stability otherwise well appearance, I think this is reasonable.  We will place him on prednisone, doxycycline, discharged with good return precautions.  Was given an IM dose of steroids here.   MEDICATIONS GIVEN IN ED: Medications  methylPREDNISolone sodium succinate (SOLU-MEDROL) 125 mg/2 mL injection 125 mg (125 mg Intramuscular Given 10/27/21 1413)  doxycycline (VIBRA-TABS) tablet 100 mg (100 mg Oral Given 10/27/21 1413)  ipratropium-albuterol (DUONEB) 0.5-2.5 (3) MG/3ML nebulizer solution 3 mL (3 mLs Nebulization Given 10/27/21 1412)     Consults:  None   EMR reviewed  Cardiology and PCP notes from today     FINAL CLINICAL IMPRESSION(S) / ED DIAGNOSES   Final diagnoses:  COPD exacerbation ([place])     Rx / DC Orders   ED Discharge Orders          Ordered    predniSONE (DELTASONE) 20 MG tablet  Daily,   Status:  Discontinued        10/27/21 1438    doxycycline (VIBRAMYCIN) 100 MG capsule  2 times daily,   Status:  Discontinued        10/27/21 1438    guaiFENesin (MUCINEX) 600 MG 12 hr tablet  2 times daily        10/27/21 1439    predniSONE (DELTASONE) 20 MG tablet  Daily        10/27/21 1439    doxycycline (VIBRAMYCIN) 100 MG capsule  2 times daily        10/27/21 1439             Note:  This document was prepared using Dragon voice recognition software and may include unintentional dictation errors.   Duffy Bruce, MD 10/27/21 [phone]

## 2021-10-27 NOTE — ED Notes (Signed)
First Nurse Note:  Pt brought over from [facility] in. Walk in staff reports that pt having chest pain and shortness of breath. Pt was seen by cardiology and they sent him to the walk in clinic. Pt is in NAD.

## 2021-10-27 NOTE — ED Triage Notes (Signed)
Pt comes with c/o sob, CP and dizziness week ago. Pt states pain in chest 9/10. Pt state some constipation.

## 2021-10-27 NOTE — ED Notes (Signed)
[place] sent to the lab.  

## 2021-10-27 NOTE — Discharge Instructions (Signed)
Use your ALBUTEROL inhaler 2 puffs every 6 hours for the next 2 days, then as needed  Take the prednisone and doxycycline as prescribed  Take Mucinex twice daily to help with secretions

## 2021-11-22 ENCOUNTER — Encounter (HOSPITAL_COMMUNITY): Payer: Self-pay | Admitting: Radiology

## 2021-12-11 ENCOUNTER — Telehealth: Payer: Medicare Other

## 2022-01-08 ENCOUNTER — Encounter: Payer: Self-pay | Admitting: Intensive Care

## 2022-01-08 ENCOUNTER — Other Ambulatory Visit: Payer: Self-pay

## 2022-01-08 ENCOUNTER — Emergency Department: Payer: Medicare Other

## 2022-01-08 ENCOUNTER — Emergency Department
Admission: EM | Admit: 2022-01-08 | Discharge: 2022-01-08 | Disposition: A | Discharge status: Home / Self Care | Payer: Medicare Other | Attending: Emergency Medicine | Admitting: Emergency Medicine

## 2022-01-08 DIAGNOSIS — Y9389 Activity, other specified: Secondary | ICD-10-CM | POA: Insufficient documentation

## 2022-01-08 DIAGNOSIS — I10 Essential (primary) hypertension: Secondary | ICD-10-CM | POA: Diagnosis not present

## 2022-01-08 DIAGNOSIS — Z7901 Long term (current) use of anticoagulants: Secondary | ICD-10-CM | POA: Diagnosis not present

## 2022-01-08 DIAGNOSIS — E119 Type 2 diabetes mellitus without complications: Secondary | ICD-10-CM | POA: Diagnosis not present

## 2022-01-08 DIAGNOSIS — W01198A Fall on same level from slipping, tripping and stumbling with subsequent striking against other object, initial encounter: Secondary | ICD-10-CM | POA: Insufficient documentation

## 2022-01-08 DIAGNOSIS — M7022 Olecranon bursitis, left elbow: Secondary | ICD-10-CM | POA: Diagnosis not present

## 2022-01-08 DIAGNOSIS — W19XXXA Unspecified fall, initial encounter: Secondary | ICD-10-CM

## 2022-01-08 MED ORDER — CEPHALEXIN 500 MG PO CAPS: 500.0000 mg | ORAL_CAPSULE | Freq: Four times a day (QID) | ORAL | 0 refills | Status: AC

## 2022-01-08 NOTE — ED Triage Notes (Signed)
Patient reports falling a week ago and hitting left elbow. Denies LOC. Left elbow now has significant swelling and drainage from area. Redness noted ?

## 2022-01-08 NOTE — ED Provider Notes (Signed)
? ?[facility] ?Provider Note ? ? ? Event Date/Time  ? First MD Initiated Contact with Patient 01/08/22 1040   ?  (approximate) ? ? ?History  ? ?Chief Complaint ?Fall and Joint Swelling ? ? ?HPI ? ?Mathew Baker is a 81 y.o. male with past medical history of hypertension, hyperlipidemia, diabetes, and atrial fibrillation on Eliquis who presents to the ED complaining of fall and joint swelling.  Patient reports that about 1 week ago he fell backwards while working in his garage onto his left elbow.  He denies hitting his head or losing consciousness, denies any headache or neck pain.  He states he has been dealing with gradually increasing swelling around his left elbow since the fall, but denies significant associated pain and has been able to range the elbow without difficulty.  He has begun to notice some drainage from his elbow, which she states has been yellow and "pus-like" at times but is currently yellowish and clear.  He denies any redness or warmth to the area and has not had any fevers.  He denies any other areas of joint pain. ?  ? ? ?Physical Exam  ? ?Triage Vital Signs: ?ED Triage Vitals [01/08/22 1006]  ?Enc Vitals Group  ?   BP 113/63  ?   Pulse Rate 81  ?   Resp 16  ?   Temp 97.7 ?F (36.5 ?C)  ?   Temp Source Oral  ?   SpO2 96 %  ?   Weight 230 lb (104.3 kg)  ?   Height '6\' 1"'$  (1.854 m)  ?   Head Circumference   ?   Peak Flow   ?   Pain Score 1  ?   Pain Loc   ?   Pain Edu?   ?   Excl. in [place]?   ? ? ?Most recent vital signs: ?Vitals:  ? 01/08/22 1006  ?BP: 113/63  ?Pulse: 81  ?Resp: 16  ?Temp: 97.7 ?F (36.5 ?C)  ?SpO2: 96%  ? ? ?Constitutional: Alert and oriented. ?Eyes: Conjunctivae are normal. ?Head: Atraumatic. ?Nose: No congestion/rhinnorhea. ?Mouth/Throat: Mucous membranes are moist.  ?Cardiovascular: Normal rate, regular rhythm. Grossly normal heart sounds.  2+ radial pulses bilaterally. ?Respiratory: Normal respiratory effort.  No retractions. Lungs CTAB. ?Gastrointestinal:  Soft and nontender. No distention. ?Musculoskeletal: Firm edema over the left olecranon with punctate area of serous drainage near the center.  No associated erythema, warmth, or tenderness to palpation noted.  Range of motion intact to left elbow without discomfort.  No lower extremity tenderness nor edema.  ?Neurologic:  Normal speech and language. No gross focal neurologic deficits are appreciated. ? ? ? ?ED Results / Procedures / Treatments  ? ?Labs ?(all labs ordered are listed, but only abnormal results are displayed) ?Labs Reviewed - No data to display ? ?RADIOLOGY ?Right elbow x-ray reviewed by me with no fracture or dislocation. ? ?PROCEDURES: ? ?Critical Care performed: No ? ?Procedures ? ? ?MEDICATIONS ORDERED IN ED: ?Medications - No data to display ? ? ?IMPRESSION / MDM / ASSESSMENT AND PLAN / ED COURSE  ?I reviewed the triage vital signs and the nursing notes. ?             ?               ? ?81 y.o. male with past medical history of hypertension, hyperlipidemia, diabetes, and atrial fibrillation on Eliquis who presents to the ED complaining of left elbow swelling and drainage following  a fall about 1 week ago. ? ?Differential diagnosis includes, but is not limited to, fracture, dislocation, septic arthritis, septic bursitis, olecranon bursitis, open bursa. ? ?Patient well-appearing and in no acute distress, vital signs are unremarkable and he is neurovascular intact to his left upper extremity.  No evidence of trauma to his head or neck area and he has what appears to be an olecranon bursitis at his left elbow.  No findings to suggest septic arthritis and the bursitis does not appear to be infected either with no erythema, warmth, or purulent drainage.  We will further assess with x-ray to rule out bony injury, plan to discuss with orthopedics given concern for open bursa. ? ?X-ray is unremarkable without evidence of bony injury.  Findings discussed with Dr. Sharlet Salina of orthopedic surgery, who agrees  with plan for prophylactic antibiotics and close follow-up with orthopedic surgery.  We will start patient on Keflex and pressure dressing was applied to help with swelling.  Patient counseled to return to the ED for new worsening symptoms and to closely follow-up with orthopedics.  Patient agrees with plan. ? ?  ? ? ?FINAL CLINICAL IMPRESSION(S) / ED DIAGNOSES  ? ?Final diagnoses:  ?Olecranon bursitis of left elbow  ?Fall, initial encounter  ? ? ? ?Rx / DC Orders  ? ?ED Discharge Orders   ? ?      Ordered  ?  cephALEXin (KEFLEX) 500 MG capsule  4 times daily       ? 01/08/22 1253  ? ?  ?  ? ?  ? ? ? ?Note:  This document was prepared using Dragon voice recognition software and may include unintentional dictation errors. ?  ?Blake Divine, MD ?01/08/22 1257 ? ?

## 2022-04-15 ENCOUNTER — Inpatient Hospital Stay: Payer: Medicare Other | Admitting: Oncology

## 2022-04-15 ENCOUNTER — Inpatient Hospital Stay: Payer: Medicare Other

## 2022-09-30 ENCOUNTER — Emergency Department: Payer: Medicare Other

## 2022-09-30 ENCOUNTER — Other Ambulatory Visit: Payer: Self-pay

## 2022-09-30 ENCOUNTER — Emergency Department
Admission: EM | Admit: 2022-09-30 | Discharge: 2022-09-30 | Disposition: A | Discharge status: Home / Self Care | Payer: Medicare Other | Attending: Emergency Medicine | Admitting: Emergency Medicine

## 2022-09-30 DIAGNOSIS — U071 COVID-19: Secondary | ICD-10-CM | POA: Insufficient documentation

## 2022-09-30 DIAGNOSIS — I10 Essential (primary) hypertension: Secondary | ICD-10-CM | POA: Diagnosis not present

## 2022-09-30 DIAGNOSIS — Z7901 Long term (current) use of anticoagulants: Secondary | ICD-10-CM | POA: Insufficient documentation

## 2022-09-30 DIAGNOSIS — R051 Acute cough: Secondary | ICD-10-CM

## 2022-09-30 DIAGNOSIS — R911 Solitary pulmonary nodule: Secondary | ICD-10-CM | POA: Diagnosis not present

## 2022-09-30 DIAGNOSIS — E162 Hypoglycemia, unspecified: Secondary | ICD-10-CM

## 2022-09-30 DIAGNOSIS — R531 Weakness: Secondary | ICD-10-CM

## 2022-09-30 DIAGNOSIS — R059 Cough, unspecified: Secondary | ICD-10-CM | POA: Diagnosis present

## 2022-09-30 DIAGNOSIS — E11649 Type 2 diabetes mellitus with hypoglycemia without coma: Secondary | ICD-10-CM | POA: Insufficient documentation

## 2022-09-30 LAB — CBC WITH DIFFERENTIAL/PLATELET
Abs Immature Granulocytes: 0.02 10*3/uL (ref 0.00–0.07)
Basophils Absolute: 0 10*3/uL (ref 0.0–0.1)
Basophils Relative: 1 %
Eosinophils Absolute: 0 10*3/uL (ref 0.0–0.5)
Eosinophils Relative: 1 %
HCT: 47 % (ref 39.0–52.0)
Hemoglobin: 15.4 g/dL (ref 13.0–17.0)
Immature Granulocytes: 0 %
Lymphocytes Relative: 24 %
Lymphs Abs: 1.3 10*3/uL (ref 0.7–4.0)
MCH: 32.6 pg (ref 26.0–34.0)
MCHC: 32.8 g/dL (ref 30.0–36.0)
MCV: 99.6 fL (ref 80.0–100.0)
Monocytes Absolute: 1.3 10*3/uL — ABNORMAL HIGH (ref 0.1–1.0)
Monocytes Relative: 25 %
Neutro Abs: 2.7 10*3/uL (ref 1.7–7.7)
Neutrophils Relative %: 49 %
Platelets: 154 10*3/uL (ref 150–400)
RBC: 4.72 MIL/uL (ref 4.22–5.81)
RDW: 13.2 % (ref 11.5–15.5)
WBC: 5.3 10*3/uL (ref 4.0–10.5)
nRBC: 0 % (ref 0.0–0.2)

## 2022-09-30 LAB — COMPREHENSIVE METABOLIC PANEL
ALT: 16 U/L (ref 0–44)
AST: 29 U/L (ref 15–41)
Albumin: 3.4 g/dL — ABNORMAL LOW (ref 3.5–5.0)
Alkaline Phosphatase: 69 U/L (ref 38–126)
Anion gap: 6 (ref 5–15)
BUN: 18 mg/dL (ref 8–23)
CO2: 23 mmol/L (ref 22–32)
Calcium: 8.4 mg/dL — ABNORMAL LOW (ref 8.9–10.3)
Chloride: 108 mmol/L (ref 98–111)
Creatinine, Ser: 0.87 mg/dL (ref 0.61–1.24)
GFR, Estimated: >60 mL/min (ref >60)
Glucose, Bld: 55 mg/dL — ABNORMAL LOW (ref 70–99)
Potassium: 4 mmol/L (ref 3.5–5.1)
Sodium: 137 mmol/L (ref 135–145)
Total Bilirubin: 1.4 mg/dL — ABNORMAL HIGH (ref 0.3–1.2)
Total Protein: 6.8 g/dL (ref 6.5–8.1)

## 2022-09-30 LAB — CBG MONITORING, ED
Glucose-Capillary: 32 mg/dL — CL (ref 70–99)
Glucose-Capillary: 73 mg/dL (ref 70–99)
Glucose-Capillary: 107 mg/dL — ABNORMAL HIGH (ref 70–99)

## 2022-09-30 LAB — RESP PANEL BY RT-PCR (RSV, FLU A&B, COVID)  RVPGX2
Influenza A by PCR: NEGATIVE
Influenza B by PCR: NEGATIVE
Resp Syncytial Virus by PCR: NEGATIVE
SARS Coronavirus 2 by RT PCR: POSITIVE — AB

## 2022-09-30 LAB — LIPASE, BLOOD: Lipase: 32 U/L (ref 11–51)

## 2022-09-30 MED ORDER — SODIUM CHLORIDE 0.9 % IV BOLUS
1000.0000 mL | Freq: Once | INTRAVENOUS | Status: AC
  Administered 2022-09-30: 1000 mL via INTRAVENOUS

## 2022-09-30 MED ORDER — DEXTROSE 50 % IV SOLN: 1.0000 | Freq: Once | INTRAVENOUS | Status: DC

## 2022-09-30 NOTE — ED Provider Notes (Signed)
[facility] Provider Note    Event Date/Time   First MD Initiated Contact with Patient 09/30/22 1224     (approximate)   History   Cough, Sore Throat, and Weakness   HPI  Mathew Baker is a 82 y.o. male with past medical history significant for hypertension, hyperlipidemia, atrial fibrillation on Eliquis, diabetes, prior history of CVA, who presents to the emergency department for not feeling well since Saturday or Sunday.  States that he went to a gun show in [place] over the weekend.  On Saturday or Sunday and hit him where he started to feel poorly.  Complaining of cough, congestion and shortness of breath.  States that he has been having pain in his upper neck and feeling like his bilateral hands feel numb and tingly.  Difficulty walking and ambulating secondary to generalized weakness in his bilateral legs.  Denies any recent falls or trauma.  Does not recall his prior stroke symptoms.  Normally ambulates with a walker.  Denies any fever or chills.  No active chest pain at this time.  Endorses a prior history of's possible COPD diagnosed with the [place].     Physical Exam   Triage Vital Signs: ED Triage Vitals [09/30/22 1008]  Enc Vitals Group     BP 126/71     Pulse Rate 63     Resp 18     Temp 98.7 F (37.1 C)     Temp Source Oral     SpO2 99 %     Weight 230 lb (104.3 kg)     Height '6\' 1"'$  (1.854 m)     Head Circumference      Peak Flow      Pain Score 10     Pain Loc      Pain Edu?      Excl. in [place]?     Most recent vital signs: Vitals:   09/30/22 1513 09/30/22 1513  BP: 120/70   Pulse: 70   Resp: 18   Temp:  98 F (36.7 C)  SpO2: 99%     Physical Exam Constitutional:      Appearance: He is well-developed. He is ill-appearing.  HENT:     Head: Atraumatic.     Nose: Congestion present.  Eyes:     Conjunctiva/sclera: Conjunctivae normal.  Cardiovascular:     Rate and Rhythm: Regular rhythm.  Pulmonary:     Effort: No  respiratory distress.     Breath sounds: Rhonchi present.  Abdominal:     Palpations: Abdomen is soft.  Musculoskeletal:     Cervical back: Normal range of motion.  Skin:    General: Skin is warm.  Neurological:     Mental Status: He is alert. Mental status is at baseline.     Comments: Cranial nerves grossly intact.  5/5 strength bilateral upper and lower extremities.  Sensation intact in upper and lower extremities.  Sensation intact bilateral hands but states that it feels different and tingling.  Slow gait with his rolling walker     IMPRESSION / MDM / ASSESSMENT AND PLAN / ED COURSE  I reviewed the triage vital signs and the nursing notes.  Differential diagnosis including COVID/influenza, bacterial pneumonia, intracranial hemorrhage, CVA, reactivation of old CVA, electrolyte abnormality, dehydration  Patient given 1 L of IV fluid   EKG  I, Nathaniel Man, the attending physician, personally viewed and interpreted this ECG.   Rate: Normal  Rhythm: Normal sinus  Axis: Normal  Intervals: Normal  ST&T Change: None  No tachycardic or bradycardic dysrhythmias while on cardiac telemetry.  RADIOLOGY I independently reviewed imaging, my interpretation of imaging: CT scan of the head, cervical spine and chest x-ray -CT scan with no acute fracture or dislocation.  Chest x-ray with no focal findings consistent with pneumonia.  Did note a pulmonary nodule and he states that he has been following with the [facility] for this and he will continue to follow.  LABS (all labs ordered are listed, but only abnormal results are displayed) Labs interpreted as -  COVID-positive.  Significantly hypoglycemic likely secondary to not eating.  Glucose 32.  Given oral glucose with food and juice and improved to 72.  Labs Reviewed  RESP PANEL BY RT-PCR (RSV, FLU A&B, COVID)  RVPGX2 - Abnormal; Notable for the following components:      Result Value   SARS Coronavirus 2 by RT PCR POSITIVE (*)    All  other components within normal limits  COMPREHENSIVE METABOLIC PANEL - Abnormal; Notable for the following components:   Glucose, Bld 55 (*)    Calcium 8.4 (*)    Albumin 3.4 (*)    Total Bilirubin 1.4 (*)    All other components within normal limits  CBC WITH DIFFERENTIAL/PLATELET - Abnormal; Notable for the following components:   Monocytes Absolute 1.3 (*)    All other components within normal limits  CBG MONITORING, ED - Abnormal; Notable for the following components:   Glucose-Capillary 32 (*)    All other components within normal limits  CBG MONITORING, ED - Abnormal; Notable for the following components:   Glucose-Capillary 107 (*)    All other components within normal limits  LIPASE, BLOOD  URINALYSIS, ROUTINE W REFLEX MICROSCOPIC  CBG MONITORING, ED    TREATMENT   1 L of IV fluid  And reevaluation glucose remained stable.  Tolerating p.o. without any difficulties.  Repeat glucose level remained stable at 102.  On review of his home medications says that he is also fallen urea however confirmed with the patient that he is only taking insulin for his diabetes and he is no longer on any oral medication.  States that he can check his glucose frequently at home and would return if his symptoms worsened.  Discussed incidental finding of pulmonary nodule.   PROCEDURES:  Critical Care performed: yes  .Critical Care  Performed by: Nathaniel Man, MD Authorized by: Nathaniel Man, MD   Critical care provider statement:    Critical care time (minutes):  30   Critical care was necessary to treat or prevent imminent or life-threatening deterioration of the following conditions:  Endocrine crisis   Critical care was time spent personally by me on the following activities:  Development of treatment plan with patient or surrogate, discussions with consultants, evaluation of patient's response to treatment, examination of patient, ordering and review of laboratory studies, ordering and  review of radiographic studies, ordering and performing treatments and interventions, pulse oximetry, re-evaluation of patient's condition and review of old charts   Patient's presentation is most consistent with acute presentation with potential threat to life or bodily function.   MEDICATIONS ORDERED IN ED: Medications  sodium chloride 0.9 % bolus 1,000 mL (0 mLs Intravenous Stopped 09/30/22 1637)    FINAL CLINICAL IMPRESSION(S) / ED DIAGNOSES   Final diagnoses:  Acute cough  Weakness  COVID  Hypoglycemia  Lung nodule     Rx / DC Orders   ED Discharge Orders  Ordered    Diet regular        09/30/22 1454             Note:  This document was prepared using Dragon voice recognition software and may include unintentional dictation errors.   Nathaniel Man, MD 09/30/22 [phone]

## 2022-09-30 NOTE — ED Triage Notes (Signed)
Pt c/o flu-like symptoms x1 week of cough, sore throat and weakness. Pt has hx of CVA and is diabetic.

## 2022-09-30 NOTE — Discharge Instructions (Signed)
You were seen in the emergency department for not feeling well.  You tested positive for COVID.  You also had a low glucose level in the emergency department.  Do not take any insulin today.  Check your glucose every 2 hours at home and make sure that you eat plenty of food.  If your glucose remains stable tomorrow you can restart your insulin.  If you have continually low glucose levels at home it is important that you call 911 and return to the emergency department.  On chest x-ray you had a pulmonary nodule that was noted.  Read as below.  Follow-up with the [facility] for further workup.  IMPRESSION:  1. Left mid lung subcentimeter nodule. Noncontrast CT correlation  recommended.

## 2023-05-16 ENCOUNTER — Emergency Department: Payer: Medicare Other

## 2023-05-16 ENCOUNTER — Other Ambulatory Visit: Payer: Self-pay

## 2023-05-16 ENCOUNTER — Emergency Department
Admission: EM | Admit: 2023-05-16 | Discharge: 2023-05-16 | Disposition: A | Discharge status: Home / Self Care | Payer: Medicare Other | Source: Home / Self Care | Attending: Emergency Medicine | Admitting: Emergency Medicine

## 2023-05-16 DIAGNOSIS — Z1152 Encounter for screening for COVID-19: Secondary | ICD-10-CM | POA: Insufficient documentation

## 2023-05-16 DIAGNOSIS — Z8673 Personal history of transient ischemic attack (TIA), and cerebral infarction without residual deficits: Secondary | ICD-10-CM | POA: Insufficient documentation

## 2023-05-16 DIAGNOSIS — Z7901 Long term (current) use of anticoagulants: Secondary | ICD-10-CM | POA: Insufficient documentation

## 2023-05-16 DIAGNOSIS — R011 Cardiac murmur, unspecified: Secondary | ICD-10-CM | POA: Insufficient documentation

## 2023-05-16 DIAGNOSIS — E119 Type 2 diabetes mellitus without complications: Secondary | ICD-10-CM | POA: Insufficient documentation

## 2023-05-16 DIAGNOSIS — K8000 Calculus of gallbladder with acute cholecystitis without obstruction: Secondary | ICD-10-CM | POA: Diagnosis not present

## 2023-05-16 DIAGNOSIS — J189 Pneumonia, unspecified organism: Secondary | ICD-10-CM | POA: Insufficient documentation

## 2023-05-16 DIAGNOSIS — K810 Acute cholecystitis: Secondary | ICD-10-CM | POA: Diagnosis not present

## 2023-05-16 DIAGNOSIS — I10 Essential (primary) hypertension: Secondary | ICD-10-CM | POA: Insufficient documentation

## 2023-05-16 LAB — HEPATIC FUNCTION PANEL
ALT: 15 U/L (ref 0–44)
AST: 20 U/L (ref 15–41)
Albumin: 3 g/dL — ABNORMAL LOW (ref 3.5–5.0)
Alkaline Phosphatase: 90 U/L (ref 38–126)
Bilirubin, Direct: 0.2 mg/dL (ref 0.0–0.2)
Indirect Bilirubin: 0.6 mg/dL (ref 0.3–0.9)
Total Bilirubin: 0.8 mg/dL (ref 0.3–1.2)
Total Protein: 5.5 g/dL — ABNORMAL LOW (ref 6.5–8.1)

## 2023-05-16 LAB — BASIC METABOLIC PANEL
Anion gap: 8 (ref 5–15)
BUN: 19 mg/dL (ref 8–23)
CO2: 20 mmol/L — ABNORMAL LOW (ref 22–32)
Calcium: 6.9 mg/dL — ABNORMAL LOW (ref 8.9–10.3)
Chloride: 116 mmol/L — ABNORMAL HIGH (ref 98–111)
Creatinine, Ser: 0.54 mg/dL — ABNORMAL LOW (ref 0.61–1.24)
GFR, Estimated: >60 mL/min (ref >60)
Glucose, Bld: 170 mg/dL — ABNORMAL HIGH (ref 70–99)
Potassium: 3 mmol/L — ABNORMAL LOW (ref 3.5–5.1)
Sodium: 144 mmol/L (ref 135–145)

## 2023-05-16 LAB — CBC
HCT: 42.7 % (ref 39.0–52.0)
Hemoglobin: 14 g/dL (ref 13.0–17.0)
MCH: 33.2 pg (ref 26.0–34.0)
MCHC: 32.8 g/dL (ref 30.0–36.0)
MCV: 101.2 fL — ABNORMAL HIGH (ref 80.0–100.0)
Platelets: 202 10*3/uL (ref 150–400)
RBC: 4.22 MIL/uL (ref 4.22–5.81)
RDW: 12.5 % (ref 11.5–15.5)
WBC: 15.1 10*3/uL — ABNORMAL HIGH (ref 4.0–10.5)
nRBC: 0 % (ref 0.0–0.2)

## 2023-05-16 LAB — TROPONIN I (HIGH SENSITIVITY)
Troponin I (High Sensitivity): 14 ng/L (ref <18)
Troponin I (High Sensitivity): 18 ng/L — ABNORMAL HIGH (ref <18)

## 2023-05-16 LAB — PROCALCITONIN: Procalcitonin: <0.1 ng/mL

## 2023-05-16 LAB — RESP PANEL BY RT-PCR (FLU A&B, COVID) ARPGX2
Influenza A by PCR: NEGATIVE
Influenza B by PCR: NEGATIVE
SARS Coronavirus 2 by RT PCR: NEGATIVE

## 2023-05-16 LAB — LIPASE, BLOOD: Lipase: 32 U/L (ref 11–51)

## 2023-05-16 LAB — PROTIME-INR
INR: 1.3 — ABNORMAL HIGH (ref 0.8–1.2)
Prothrombin Time: 15.9 s — ABNORMAL HIGH (ref 11.4–15.2)

## 2023-05-16 MED ORDER — AZITHROMYCIN 250 MG PO TABS: ORAL_TABLET | ORAL | 0 refills | Status: DC

## 2023-05-16 MED ORDER — BENZONATATE 100 MG PO CAPS: 100.0000 mg | ORAL_CAPSULE | Freq: Two times a day (BID) | ORAL | 0 refills | Status: DC | PRN

## 2023-05-16 MED ORDER — AMOXICILLIN-POT CLAVULANATE 875-125 MG PO TABS: 1.0000 | ORAL_TABLET | Freq: Two times a day (BID) | ORAL | 0 refills | Status: DC

## 2023-05-16 MED ORDER — POTASSIUM CHLORIDE CRYS ER 20 MEQ PO TBCR
40.0000 meq | EXTENDED_RELEASE_TABLET | Freq: Once | ORAL | Status: AC
  Administered 2023-05-16: 40 meq via ORAL
  Filled 2023-05-16: qty 2

## 2023-05-16 MED ORDER — HYDROCODONE-ACETAMINOPHEN 5-325 MG PO TABS
1.0000 | ORAL_TABLET | Freq: Once | ORAL | Status: AC
  Administered 2023-05-16: 1 via ORAL
  Filled 2023-05-16: qty 1

## 2023-05-16 MED ORDER — BENZONATATE 100 MG PO CAPS
100.0000 mg | ORAL_CAPSULE | Freq: Once | ORAL | Status: AC
  Administered 2023-05-16: 100 mg via ORAL
  Filled 2023-05-16: qty 1

## 2023-05-16 MED ORDER — MORPHINE SULFATE (PF) 2 MG/ML IV SOLN
2.0000 mg | Freq: Once | INTRAVENOUS | Status: AC
  Administered 2023-05-16: 2 mg via INTRAVENOUS
  Filled 2023-05-16: qty 1

## 2023-05-16 MED ORDER — IOHEXOL 350 MG/ML SOLN
100.0000 mL | Freq: Once | INTRAVENOUS | Status: AC | PRN
  Administered 2023-05-16: 100 mL via INTRAVENOUS

## 2023-05-16 NOTE — Group Note (Deleted)

## 2023-05-16 NOTE — ED Triage Notes (Signed)
Awoke by coughing fit around 0230 and now endorses right side chest pain that radiates down side.

## 2023-05-16 NOTE — ED Notes (Signed)
Patient transported to CT 

## 2023-05-16 NOTE — ED Notes (Signed)
Patient transported to X-ray 

## 2023-05-16 NOTE — ED Provider Notes (Signed)
[facility] Provider Note    Event Date/Time   First MD Initiated Contact with Patient 05/16/23 [phone]     (approximate)   History   R chest pain  HPI  Mathew Baker is a 82 y.o. male  hypertension, hyperlipidemia, atrial fibrillation on Eliquis, diabetes, prior history of CVA. Patient reports about 230 this morning he was coughing and suddenly started having sharp pain in his right lower ribs.  No nausea vomiting no fevers or chills. Reports pain is improved if he lays down or does not take deep breaths but if he takes a deep breath or coughs he gets a sharp pain in his right ribs.  Points towards his right lower chest wall.  No pressure no sweats no fevers or chills.  He relates that he has had a cough for about 6 months, follows with the [facility].  He did CT scan there in August  He has not noticed any chills.  He does report at times when he coughs he brings a slight productive component but this has been present for 6 months or more.  Denies any nausea vomiting or abdominal pain   Physical Exam   Triage Vital Signs: ED Triage Vitals  Encounter Vitals Group     BP      Systolic BP Percentile      Diastolic BP Percentile      Pulse      Resp      Temp      Temp src      SpO2      Weight      Height      Head Circumference      Peak Flow      Pain Score      Pain Loc      Pain Education      Exclude from Growth Chart     Most recent vital signs: Vitals:   05/16/23 0932 05/16/23 1000  BP: (!) 159/90 (!) 141/68  Pulse: 76 71  Resp: 19 12  Temp:    SpO2: 97% 94%     General: Awake, no distress.  He has pain and reports sharp pain when he takes a deep breath.  He appears comfortable and otherwise at rest.  Patient does note he takes hydrocodone at home intermittently for chronic pain issues, has not made today CV:  Good peripheral perfusion.  Normal rate.  Mild systolic ejection murmur is present Resp:  Normal effort.  Clear  bilaterally.  Normal effort with respiration but if he takes a deep breath he will get a sharp pain over the right chest Abd:  No distention.  Soft nontender nondistended except some discomfort to palpation along the right upper abdomen.  Murphy sign not positive.  Patient is reproduced by him trying to perform a sit up like activity.  Also tender along the right lower chest wall reproduces the pain he did notes Other:  No lower extremity edema   ED Results / Procedures / Treatments   Labs (all labs ordered are listed, but only abnormal results are displayed) Labs Reviewed  CBC - Abnormal; Notable for the following components:      Result Value   WBC 15.1 (*)    MCV 101.2 (*)    All other components within normal limits  BASIC METABOLIC PANEL - Abnormal; Notable for the following components:   Potassium 3.0 (*)    Chloride 116 (*)    CO2 20 (*)  Glucose, Bld 170 (*)    Creatinine, Ser 0.54 (*)    Calcium 6.9 (*)    All other components within normal limits  PROTIME-INR - Abnormal; Notable for the following components:   Prothrombin Time 15.9 (*)    INR 1.3 (*)    All other components within normal limits  HEPATIC FUNCTION PANEL - Abnormal; Notable for the following components:   Total Protein 5.5 (*)    Albumin 3.0 (*)    All other components within normal limits  TROPONIN I (HIGH SENSITIVITY) - Abnormal; Notable for the following components:   Troponin I (High Sensitivity) 18 (*)    All other components within normal limits  RESP PANEL BY RT-PCR (FLU A&B, COVID) [id number]  PROCALCITONIN  LIPASE, BLOOD  TROPONIN I (HIGH SENSITIVITY)     EKG  As interpreted by me at 7:02 AM heart rate 70 QRS 130 QTc 440 Normal sinus rhythm, left bundle branch block.  No evidence of acute ischemia.  Again interpreted in the setting of left bundle branch block.  No significant noted change in morphology compared to previous   RADIOLOGY Chest x-ray interpreted by me as low lung volumes but  no obvious infiltrate    CT Angio Chest PE W and/or Wo Contrast  Result Date: 05/16/2023 CLINICAL DATA:  Pulmonary embolism suspected, high probability. Right-sided pleuritic chest pain. Episode of coughing in the middle the night with subsequent right chest and abdominal pain. EXAM: CT ANGIOGRAPHY CHEST CT ABDOMEN AND PELVIS WITH CONTRAST TECHNIQUE: Multidetector CT imaging of the chest was performed using the standard protocol during bolus administration of intravenous contrast. Multiplanar CT image reconstructions and MIPs were obtained to evaluate the vascular anatomy. Multidetector CT imaging of the abdomen and pelvis was performed using the standard protocol during bolus administration of intravenous contrast. RADIATION DOSE REDUCTION: This exam was performed according to the departmental dose-optimization program which includes automated exposure control, adjustment of the mA and/or kV according to patient size and/or use of iterative reconstruction technique. CONTRAST:  OMNIPAQUE IOHEXOL 350 MG/ML SOLN COMPARISON:  Two-view chest x-ray 05/16/2023 FINDINGS: CTA CHEST FINDINGS Cardiovascular: The heart is mildly enlarged. Coronary artery calcifications are present. Atherosclerotic calcifications are present in the distal arch and extending superiorly in the left subclavian artery without a significant stenosis relative to the more distal vessels. Pulmonary artery opacification is excellent. No focal filling defect is present to suggest pulmonary embolus. Pulmonary artery size is normal. Mediastinum/Nodes: No enlarged mediastinal, hilar, or axillary lymph nodes. Thyroid gland, trachea, and esophagus demonstrate no significant findings. Lungs/Pleura: Patchy peribronchial airspace opacities are present in the right upper lobe, anteriorly in the right middle lobe and superior segment of the right lower lobe. Mild dependent atelectasis is present in the left lung. No pleural disease is present.  Musculoskeletal: Remote anterior right-sided rib fractures are noted. No acute fractures are present. Incidental note is made of bilateral gynecomastia. Review of the MIP images confirms the above findings. CT ABDOMEN and PELVIS FINDINGS Hepatobiliary: The gallbladder is moderately distended. Dependent gallstones are present without inflammatory change. A low-density lesion superiorly within the left lobe of the liver is not clearly a simple cyst. Dilated intrahepatic biliary ducts are present anteriorly and to the left of the lesion. Is some nodularity of the liver. The caudate lobe is enlarged. Pancreas: Unremarkable. No pancreatic ductal dilatation or surrounding inflammatory changes. Spleen: Normal in size without focal abnormality. Adrenals/Urinary Tract: Adrenal glands are normal bilaterally. Kidneys are unremarkable. No stone or mass lesion  is present. No obstruction is present. The ureters are within normal limits. The urinary bladder is normal. Stomach/Bowel: Stomach and duodenum are within normal limits. Small bowel is unremarkable. The terminal ileum is normal. The appendix is visualized and normal. The ascending and transverse colon are within normal limits. The descending and sigmoid colon are normal. Vascular/Lymphatic: Atherosclerotic calcifications are present in the aorta and branch vessels. No aneurysm is present. No significant adenopathy is present. Reproductive: The prostate is enlarged, measuring 6.5 cm in transverse diameter. Seminal vesicles are unremarkable. Other: No abdominal wall hernia or abnormality. No abdominopelvic ascites. Musculoskeletal: The vertebral body heights and alignment are normal. A remote superior endplate L1 fracture is present. Straightening of the normal lumbar lordosis is present. Bony pelvis is within normal limits. Mild degenerative changes are present at both hips. Review of the MIP images confirms the above findings. IMPRESSION: 1. No pulmonary embolus. 2. Patchy  peribronchial airspace opacities in the right upper lobe, anteriorly in the right middle lobe and superior segment of the right lower lobe most likely represents inflammation or early infection. 3. Cardiomegaly without failure. 4. Coronary artery disease. 5. Cholelithiasis without evidence for cholecystitis. 6. Low-density lesion superiorly within the left lobe of the liver is not clearly a simple cyst. Dilated intrahepatic biliary ducts are present anteriorly and to the left of the lesion. Recommend non emergent MRI of the abdomen without and with contrast for further evaluation. 7. Enlarged prostate gland. 8. Remote superior endplate L1 fracture. 9.  Aortic Atherosclerosis (ICD10-I70.0). Electronically Signed   By: Marin Roberts M.D.   On: 05/16/2023 09:38   CT ABDOMEN PELVIS W CONTRAST  Result Date: 05/16/2023 CLINICAL DATA:  Pulmonary embolism suspected, high probability. Right-sided pleuritic chest pain. Episode of coughing in the middle the night with subsequent right chest and abdominal pain. EXAM: CT ANGIOGRAPHY CHEST CT ABDOMEN AND PELVIS WITH CONTRAST TECHNIQUE: Multidetector CT imaging of the chest was performed using the standard protocol during bolus administration of intravenous contrast. Multiplanar CT image reconstructions and MIPs were obtained to evaluate the vascular anatomy. Multidetector CT imaging of the abdomen and pelvis was performed using the standard protocol during bolus administration of intravenous contrast. RADIATION DOSE REDUCTION: This exam was performed according to the departmental dose-optimization program which includes automated exposure control, adjustment of the mA and/or kV according to patient size and/or use of iterative reconstruction technique. CONTRAST:  OMNIPAQUE IOHEXOL 350 MG/ML SOLN COMPARISON:  Two-view chest x-ray 05/16/2023 FINDINGS: CTA CHEST FINDINGS Cardiovascular: The heart is mildly enlarged. Coronary artery calcifications are present.  Atherosclerotic calcifications are present in the distal arch and extending superiorly in the left subclavian artery without a significant stenosis relative to the more distal vessels. Pulmonary artery opacification is excellent. No focal filling defect is present to suggest pulmonary embolus. Pulmonary artery size is normal. Mediastinum/Nodes: No enlarged mediastinal, hilar, or axillary lymph nodes. Thyroid gland, trachea, and esophagus demonstrate no significant findings. Lungs/Pleura: Patchy peribronchial airspace opacities are present in the right upper lobe, anteriorly in the right middle lobe and superior segment of the right lower lobe. Mild dependent atelectasis is present in the left lung. No pleural disease is present. Musculoskeletal: Remote anterior right-sided rib fractures are noted. No acute fractures are present. Incidental note is made of bilateral gynecomastia. Review of the MIP images confirms the above findings. CT ABDOMEN and PELVIS FINDINGS Hepatobiliary: The gallbladder is moderately distended. Dependent gallstones are present without inflammatory change. A low-density lesion superiorly within the left lobe of the liver  is not clearly a simple cyst. Dilated intrahepatic biliary ducts are present anteriorly and to the left of the lesion. Is some nodularity of the liver. The caudate lobe is enlarged. Pancreas: Unremarkable. No pancreatic ductal dilatation or surrounding inflammatory changes. Spleen: Normal in size without focal abnormality. Adrenals/Urinary Tract: Adrenal glands are normal bilaterally. Kidneys are unremarkable. No stone or mass lesion is present. No obstruction is present. The ureters are within normal limits. The urinary bladder is normal. Stomach/Bowel: Stomach and duodenum are within normal limits. Small bowel is unremarkable. The terminal ileum is normal. The appendix is visualized and normal. The ascending and transverse colon are within normal limits. The descending and  sigmoid colon are normal. Vascular/Lymphatic: Atherosclerotic calcifications are present in the aorta and branch vessels. No aneurysm is present. No significant adenopathy is present. Reproductive: The prostate is enlarged, measuring 6.5 cm in transverse diameter. Seminal vesicles are unremarkable. Other: No abdominal wall hernia or abnormality. No abdominopelvic ascites. Musculoskeletal: The vertebral body heights and alignment are normal. A remote superior endplate L1 fracture is present. Straightening of the normal lumbar lordosis is present. Bony pelvis is within normal limits. Mild degenerative changes are present at both hips. Review of the MIP images confirms the above findings. IMPRESSION: 1. No pulmonary embolus. 2. Patchy peribronchial airspace opacities in the right upper lobe, anteriorly in the right middle lobe and superior segment of the right lower lobe most likely represents inflammation or early infection. 3. Cardiomegaly without failure. 4. Coronary artery disease. 5. Cholelithiasis without evidence for cholecystitis. 6. Low-density lesion superiorly within the left lobe of the liver is not clearly a simple cyst. Dilated intrahepatic biliary ducts are present anteriorly and to the left of the lesion. Recommend non emergent MRI of the abdomen without and with contrast for further evaluation. 7. Enlarged prostate gland. 8. Remote superior endplate L1 fracture. 9.  Aortic Atherosclerosis (ICD10-I70.0). Electronically Signed   By: Marin Roberts M.D.   On: 05/16/2023 09:38   DG Chest 2 View  Result Date: 05/16/2023 CLINICAL DATA:  Right chest pain. EXAM: CHEST - 2 VIEW COMPARISON:  PA Lat 09/30/2022 FINDINGS: There is mild cardiomegaly with no evidence of CHF. There is patchy aortic calcification with slight aortic tortuosity and stable mediastinum. There is decreased depth of inspiration today with increased linear atelectasis in the left base. No focal pneumonia is evident. No new osseous  findings. Several overlying monitor wires. IMPRESSION: Decreased depth of inspiration with increased linear atelectasis in the left base. No evidence of acute chest process. Electronically Signed   By: Almira Bar M.D.   On: 05/16/2023 07:50      PROCEDURES:  Critical Care performed: No  Procedures   MEDICATIONS ORDERED IN ED: Medications  HYDROcodone-acetaminophen (NORCO/VICODIN) 5-325 MG per tablet 1 tablet (1 tablet Oral Given 05/16/23 0756)  benzonatate (TESSALON) capsule 100 mg (100 mg Oral Given 05/16/23 0756)  morphine (PF) 2 MG/ML injection 2 mg (2 mg Intravenous Given 05/16/23 0757)  potassium chloride SA (KLOR-CON M) CR tablet 40 mEq (40 mEq Oral Given 05/16/23 0901)  iohexol (OMNIPAQUE) 350 MG/ML injection 100 mL (100 mLs Intravenous Contrast Given 05/16/23 0840)     IMPRESSION / MDM / ASSESSMENT AND PLAN / ED COURSE  I reviewed the triage vital signs and the nursing notes.                              Differential diagnosis includes, but is not limited  to, rib strain intercostal strain, pneumonia, pleurisy, chronic bronchitis, less likely finding such as early empyema, bacterial pneumonia, pulmonary embolism especially in the setting of anticoagulation, ACS etc.  Symptoms not typical of ACS.  He has tenderness in the right upper quadrant but I suspect this is musculoskeletal as he reports primarily right-sided pleuritic chest pain as well but given his clinical history will add on LFTs and CT to exclude PE and further evaluate for cause of pleuritic pain as well as include the abdomen and this to evaluate for right upper quadrant pathology though seems clinically less likely to represent cause such as acute hepatitis or cholecystitis.  The patient does relate a history of liver cancer and has been treated with seeding to the right upper quadrant in the somewhat remote past.  He has no acute abdominal symptoms.  His white count is elevated at 15.  Normal respiratory rate normal heart  rate.  He denies any acute infectious symptoms other than slight productive cough but relates that this has been present for over 6 months time.    Patient's presentation is most consistent with acute complicated illness / injury requiring diagnostic workup.  ----------------------------------------- 10:10 AM on 05/16/2023 ----------------------------------------- Patient resting comfortably, in no distress.  Pain improved cough improved.  Will prescribe Augmentin and azithromycin for community-acquired pneumonia.  No recent infections or hospitalization.  He will continue to follow-up with his physician Dr. Dareen Piano and also informed him to obtain follow-up and notify Dr. Dareen Piano about detected cardiac murmur  He has no hypoxia, he is resting comfortably, no evidence of sepsis.  He does not show any distress and his pain is well-controlled.  He has hydrocodone at home which she has as an as needed medication.  Return precautions and treatment recommendations and follow-up discussed with the patient who is agreeable with the plan.  Additional findings on CT such as distended gallbladder, hepatic lesion, etc. are noted but patient is already following closely with [facility] as well for this and has known history with follow-up on his liver cancer          FINAL CLINICAL IMPRESSION(S) / ED DIAGNOSES   Final diagnoses:  Community acquired pneumonia of right lung, unspecified part of lung  Systolic murmur     Rx / DC Orders   ED Discharge Orders          Ordered    azithromycin (ZITHROMAX) 250 MG tablet        05/16/23 1012    amoxicillin-clavulanate (AUGMENTIN) 875-125 MG tablet  2 times daily        05/16/23 1012    benzonatate (TESSALON PERLES) 100 MG capsule  2 times daily PRN        05/16/23 1012             Note:  This document was prepared using Dragon voice recognition software and may include unintentional dictation errors.   Sharyn Creamer,  MD 05/16/23 1029

## 2023-05-19 ENCOUNTER — Emergency Department: Payer: Medicare Other

## 2023-05-19 ENCOUNTER — Inpatient Hospital Stay
Admission: EM | Admit: 2023-05-19 | Discharge: 2023-05-24 | DRG: 444 | Disposition: A | Discharge status: Home Health | Payer: Medicare Other | Attending: Internal Medicine | Admitting: Internal Medicine

## 2023-05-19 ENCOUNTER — Other Ambulatory Visit: Payer: Self-pay

## 2023-05-19 DIAGNOSIS — F4312 Post-traumatic stress disorder, chronic: Secondary | ICD-10-CM | POA: Diagnosis present

## 2023-05-19 DIAGNOSIS — Z79899 Other long term (current) drug therapy: Secondary | ICD-10-CM

## 2023-05-19 DIAGNOSIS — R6520 Severe sepsis without septic shock: Secondary | ICD-10-CM | POA: Diagnosis not present

## 2023-05-19 DIAGNOSIS — J9601 Acute respiratory failure with hypoxia: Secondary | ICD-10-CM | POA: Diagnosis not present

## 2023-05-19 DIAGNOSIS — I2510 Atherosclerotic heart disease of native coronary artery without angina pectoris: Secondary | ICD-10-CM | POA: Diagnosis present

## 2023-05-19 DIAGNOSIS — E872 Acidosis, unspecified: Secondary | ICD-10-CM | POA: Diagnosis present

## 2023-05-19 DIAGNOSIS — E785 Hyperlipidemia, unspecified: Secondary | ICD-10-CM | POA: Diagnosis present

## 2023-05-19 DIAGNOSIS — Z833 Family history of diabetes mellitus: Secondary | ICD-10-CM

## 2023-05-19 DIAGNOSIS — Z7901 Long term (current) use of anticoagulants: Secondary | ICD-10-CM | POA: Diagnosis not present

## 2023-05-19 DIAGNOSIS — Z87891 Personal history of nicotine dependence: Secondary | ICD-10-CM

## 2023-05-19 DIAGNOSIS — M7989 Other specified soft tissue disorders: Secondary | ICD-10-CM | POA: Diagnosis present

## 2023-05-19 DIAGNOSIS — Z7982 Long term (current) use of aspirin: Secondary | ICD-10-CM

## 2023-05-19 DIAGNOSIS — K8000 Calculus of gallbladder with acute cholecystitis without obstruction: Principal | ICD-10-CM | POA: Diagnosis present

## 2023-05-19 DIAGNOSIS — N179 Acute kidney failure, unspecified: Secondary | ICD-10-CM | POA: Diagnosis present

## 2023-05-19 DIAGNOSIS — Z794 Long term (current) use of insulin: Secondary | ICD-10-CM | POA: Diagnosis not present

## 2023-05-19 DIAGNOSIS — Z7984 Long term (current) use of oral hypoglycemic drugs: Secondary | ICD-10-CM | POA: Diagnosis not present

## 2023-05-19 DIAGNOSIS — H409 Unspecified glaucoma: Secondary | ICD-10-CM | POA: Diagnosis present

## 2023-05-19 DIAGNOSIS — B370 Candidal stomatitis: Secondary | ICD-10-CM | POA: Diagnosis present

## 2023-05-19 DIAGNOSIS — E669 Obesity, unspecified: Secondary | ICD-10-CM | POA: Diagnosis present

## 2023-05-19 DIAGNOSIS — J440 Chronic obstructive pulmonary disease with acute lower respiratory infection: Secondary | ICD-10-CM | POA: Diagnosis present

## 2023-05-19 DIAGNOSIS — K769 Liver disease, unspecified: Secondary | ICD-10-CM | POA: Diagnosis present

## 2023-05-19 DIAGNOSIS — E876 Hypokalemia: Secondary | ICD-10-CM | POA: Diagnosis present

## 2023-05-19 DIAGNOSIS — K810 Acute cholecystitis: Secondary | ICD-10-CM | POA: Diagnosis present

## 2023-05-19 DIAGNOSIS — J181 Lobar pneumonia, unspecified organism: Secondary | ICD-10-CM | POA: Diagnosis not present

## 2023-05-19 DIAGNOSIS — I10 Essential (primary) hypertension: Secondary | ICD-10-CM | POA: Diagnosis present

## 2023-05-19 DIAGNOSIS — Z8673 Personal history of transient ischemic attack (TIA), and cerebral infarction without residual deficits: Secondary | ICD-10-CM

## 2023-05-19 DIAGNOSIS — Z6830 Body mass index (BMI) 30.0-30.9, adult: Secondary | ICD-10-CM | POA: Diagnosis not present

## 2023-05-19 DIAGNOSIS — J189 Pneumonia, unspecified organism: Secondary | ICD-10-CM

## 2023-05-19 DIAGNOSIS — W19XXXA Unspecified fall, initial encounter: Secondary | ICD-10-CM | POA: Diagnosis present

## 2023-05-19 DIAGNOSIS — R55 Syncope and collapse: Secondary | ICD-10-CM | POA: Diagnosis present

## 2023-05-19 DIAGNOSIS — I4891 Unspecified atrial fibrillation: Secondary | ICD-10-CM | POA: Diagnosis present

## 2023-05-19 DIAGNOSIS — Z1152 Encounter for screening for COVID-19: Secondary | ICD-10-CM

## 2023-05-19 DIAGNOSIS — E869 Volume depletion, unspecified: Secondary | ICD-10-CM | POA: Diagnosis present

## 2023-05-19 DIAGNOSIS — E1165 Type 2 diabetes mellitus with hyperglycemia: Secondary | ICD-10-CM | POA: Diagnosis not present

## 2023-05-19 DIAGNOSIS — A419 Sepsis, unspecified organism: Secondary | ICD-10-CM | POA: Diagnosis not present

## 2023-05-19 DIAGNOSIS — R7401 Elevation of levels of liver transaminase levels: Secondary | ICD-10-CM | POA: Diagnosis present

## 2023-05-19 DIAGNOSIS — E1169 Type 2 diabetes mellitus with other specified complication: Secondary | ICD-10-CM

## 2023-05-19 LAB — URINALYSIS, W/ REFLEX TO CULTURE (INFECTION SUSPECTED)
Bacteria, UA: NONE SEEN
Bilirubin Urine: NEGATIVE
Glucose, UA: >=500 mg/dL — AB
Ketones, ur: NEGATIVE mg/dL
Leukocytes,Ua: NEGATIVE
Nitrite: NEGATIVE
Protein, ur: NEGATIVE mg/dL
Specific Gravity, Urine: 1.035 — ABNORMAL HIGH (ref 1.005–1.030)
pH: 5 (ref 5.0–8.0)

## 2023-05-19 LAB — CBC WITH DIFFERENTIAL/PLATELET
Abs Immature Granulocytes: 0.39 10*3/uL — ABNORMAL HIGH (ref 0.00–0.07)
Basophils Absolute: 0.1 10*3/uL (ref 0.0–0.1)
Basophils Relative: 0 %
Eosinophils Absolute: 0 10*3/uL (ref 0.0–0.5)
Eosinophils Relative: 0 %
HCT: 44.4 % (ref 39.0–52.0)
Hemoglobin: 14.5 g/dL (ref 13.0–17.0)
Immature Granulocytes: 2 %
Lymphocytes Relative: 3 %
Lymphs Abs: 0.8 10*3/uL (ref 0.7–4.0)
MCH: 33.1 pg (ref 26.0–34.0)
MCHC: 32.7 g/dL (ref 30.0–36.0)
MCV: 101.4 fL — ABNORMAL HIGH (ref 80.0–100.0)
Monocytes Absolute: 1.7 10*3/uL — ABNORMAL HIGH (ref 0.1–1.0)
Monocytes Relative: 7 %
Neutro Abs: 20.4 10*3/uL — ABNORMAL HIGH (ref 1.7–7.7)
Neutrophils Relative %: 88 %
Platelets: 230 10*3/uL (ref 150–400)
RBC: 4.38 MIL/uL (ref 4.22–5.81)
RDW: 13.2 % (ref 11.5–15.5)
WBC: 23.4 10*3/uL — ABNORMAL HIGH (ref 4.0–10.5)
nRBC: 0 % (ref 0.0–0.2)

## 2023-05-19 LAB — BLOOD GAS, VENOUS
Acid-Base Excess: 2.5 mmol/L — ABNORMAL HIGH (ref 0.0–2.0)
Bicarbonate: 27.9 mmol/L (ref 20.0–28.0)
O2 Saturation: 72.3 %
Patient temperature: 37
pCO2, Ven: 45 mmHg (ref 44–60)
pH, Ven: 7.4 (ref 7.25–7.43)
pO2, Ven: 43 mmHg (ref 32–45)

## 2023-05-19 LAB — COMPREHENSIVE METABOLIC PANEL
ALT: 38 U/L (ref 0–44)
AST: 78 U/L — ABNORMAL HIGH (ref 15–41)
Albumin: 3 g/dL — ABNORMAL LOW (ref 3.5–5.0)
Alkaline Phosphatase: 115 U/L (ref 38–126)
Anion gap: 16 — ABNORMAL HIGH (ref 5–15)
BUN: 63 mg/dL — ABNORMAL HIGH (ref 8–23)
CO2: 20 mmol/L — ABNORMAL LOW (ref 22–32)
Calcium: 8.4 mg/dL — ABNORMAL LOW (ref 8.9–10.3)
Chloride: 103 mmol/L (ref 98–111)
Creatinine, Ser: 1.57 mg/dL — ABNORMAL HIGH (ref 0.61–1.24)
GFR, Estimated: 43 mL/min — ABNORMAL LOW (ref >60)
Glucose, Bld: 447 mg/dL — ABNORMAL HIGH (ref 70–99)
Potassium: 4.1 mmol/L (ref 3.5–5.1)
Sodium: 139 mmol/L (ref 135–145)
Total Bilirubin: 1.8 mg/dL — ABNORMAL HIGH (ref 0.3–1.2)
Total Protein: 6.7 g/dL (ref 6.5–8.1)

## 2023-05-19 LAB — BRAIN NATRIURETIC PEPTIDE: B Natriuretic Peptide: 117.4 pg/mL — ABNORMAL HIGH (ref 0.0–100.0)

## 2023-05-19 LAB — LACTIC ACID, PLASMA
Lactic Acid, Venous: 5.9 mmol/L (ref 0.5–1.9)
Lactic Acid, Venous: >9 mmol/L (ref 0.5–1.9)
Lactic Acid, Venous: 1.7 mmol/L (ref 0.5–1.9)

## 2023-05-19 LAB — BASIC METABOLIC PANEL
Anion gap: 12 (ref 5–15)
BUN: 61 mg/dL — ABNORMAL HIGH (ref 8–23)
CO2: 24 mmol/L (ref 22–32)
Calcium: 8.3 mg/dL — ABNORMAL LOW (ref 8.9–10.3)
Chloride: 105 mmol/L (ref 98–111)
Creatinine, Ser: 1.3 mg/dL — ABNORMAL HIGH (ref 0.61–1.24)
GFR, Estimated: 55 mL/min — ABNORMAL LOW (ref >60)
Glucose, Bld: 269 mg/dL — ABNORMAL HIGH (ref 70–99)
Potassium: 3.8 mmol/L (ref 3.5–5.1)
Sodium: 141 mmol/L (ref 135–145)

## 2023-05-19 LAB — LIPASE, BLOOD: Lipase: 58 U/L — ABNORMAL HIGH (ref 11–51)

## 2023-05-19 LAB — GLUCOSE, CAPILLARY: Glucose-Capillary: 264 mg/dL — ABNORMAL HIGH (ref 70–99)

## 2023-05-19 LAB — PROCALCITONIN: Procalcitonin: 7.36 ng/mL

## 2023-05-19 LAB — BETA-HYDROXYBUTYRIC ACID: Beta-Hydroxybutyric Acid: 0.39 mmol/L — ABNORMAL HIGH (ref 0.05–0.27)

## 2023-05-19 LAB — SARS CORONAVIRUS 2 BY RT PCR: SARS Coronavirus 2 by RT PCR: NEGATIVE

## 2023-05-19 MED ORDER — LACTATED RINGERS IV BOLUS
1000.0000 mL | Freq: Once | INTRAVENOUS | Status: AC
  Administered 2023-05-19: 1000 mL via INTRAVENOUS

## 2023-05-19 MED ORDER — DEXTROSE 50 % IV SOLN: 0.0000 mL | INTRAVENOUS | Status: DC | PRN

## 2023-05-19 MED ORDER — IPRATROPIUM-ALBUTEROL 0.5-2.5 (3) MG/3ML IN SOLN: 3.0000 mL | RESPIRATORY_TRACT | Status: DC | PRN

## 2023-05-19 MED ORDER — LATANOPROST 0.005 % OP SOLN
1.0000 [drp] | Freq: Every day | OPHTHALMIC | Status: DC
  Administered 2023-05-20 – 2023-05-23 (×5): 1 [drp] via OPHTHALMIC
  Filled 2023-05-19: qty 2.5

## 2023-05-19 MED ORDER — HYDRALAZINE HCL 20 MG/ML IJ SOLN: 10.0000 mg | INTRAMUSCULAR | Status: DC | PRN

## 2023-05-19 MED ORDER — IOHEXOL 300 MG/ML  SOLN
80.0000 mL | Freq: Once | INTRAMUSCULAR | Status: AC | PRN
  Administered 2023-05-19: 80 mL via INTRAVENOUS

## 2023-05-19 MED ORDER — PANTOPRAZOLE SODIUM 40 MG PO TBEC
40.0000 mg | DELAYED_RELEASE_TABLET | Freq: Every day | ORAL | Status: DC
  Administered 2023-05-20 – 2023-05-24 (×5): 40 mg via ORAL
  Filled 2023-05-19 (×5): qty 1

## 2023-05-19 MED ORDER — POLYETHYLENE GLYCOL 3350 17 G PO PACK
17.0000 g | PACK | Freq: Every day | ORAL | Status: DC | PRN
  Filled 2023-05-19: qty 1

## 2023-05-19 MED ORDER — CHLORHEXIDINE GLUCONATE CLOTH 2 % EX PADS
6.0000 | MEDICATED_PAD | Freq: Every day | CUTANEOUS | Status: DC
  Administered 2023-05-20 (×2): 6 via TOPICAL

## 2023-05-19 MED ORDER — PIPERACILLIN-TAZOBACTAM 3.375 G IVPB
3.3750 g | Freq: Three times a day (TID) | INTRAVENOUS | Status: DC
  Administered 2023-05-19 – 2023-05-24 (×14): 3.375 g via INTRAVENOUS
  Filled 2023-05-19 (×14): qty 50

## 2023-05-19 MED ORDER — SODIUM CHLORIDE 0.9 % IV SOLN
2.0000 g | Freq: Once | INTRAVENOUS | Status: AC
  Administered 2023-05-19: 2 g via INTRAVENOUS
  Filled 2023-05-19: qty 12.5

## 2023-05-19 MED ORDER — INSULIN REGULAR(HUMAN) IN NACL 100-0.9 UT/100ML-% IV SOLN: INTRAVENOUS | Status: DC

## 2023-05-19 MED ORDER — VANCOMYCIN HCL IN DEXTROSE 1-5 GM/200ML-% IV SOLN
1000.0000 mg | Freq: Once | INTRAVENOUS | Status: AC
  Administered 2023-05-19: 1000 mg via INTRAVENOUS
  Filled 2023-05-19: qty 200

## 2023-05-19 MED ORDER — INSULIN ASPART 100 UNIT/ML IJ SOLN
0.0000 [IU] | INTRAMUSCULAR | Status: DC
  Administered 2023-05-20: 11 [IU] via SUBCUTANEOUS
  Administered 2023-05-20: 3 [IU] via SUBCUTANEOUS
  Administered 2023-05-20: 4 [IU] via SUBCUTANEOUS
  Administered 2023-05-20: 3 [IU] via SUBCUTANEOUS
  Administered 2023-05-21: 4 [IU] via SUBCUTANEOUS
  Administered 2023-05-21: 11 [IU] via SUBCUTANEOUS
  Administered 2023-05-21: 3 [IU] via SUBCUTANEOUS
  Administered 2023-05-21 (×2): 4 [IU] via SUBCUTANEOUS
  Administered 2023-05-21: 15 [IU] via SUBCUTANEOUS
  Administered 2023-05-22: 7 [IU] via SUBCUTANEOUS
  Filled 2023-05-19 (×11): qty 1

## 2023-05-19 MED ORDER — DOCUSATE SODIUM 100 MG PO CAPS: 100.0000 mg | ORAL_CAPSULE | Freq: Two times a day (BID) | ORAL | Status: DC | PRN

## 2023-05-19 MED ORDER — ACETAMINOPHEN 325 MG PO TABS: 650.0000 mg | ORAL_TABLET | ORAL | Status: DC | PRN

## 2023-05-19 MED ORDER — LACTATED RINGERS IV SOLN: INTRAVENOUS | Status: DC

## 2023-05-19 MED ORDER — DEXTROSE IN LACTATED RINGERS 5 % IV SOLN: INTRAVENOUS | Status: DC

## 2023-05-19 NOTE — Progress Notes (Signed)
Pharmacy Antibiotic Note  Mathew Baker is a 82 y.o. male admitted on 05/19/2023 with  intra-abdominal infection .  Pharmacy has been consulted for Zosyn dosing.  Plan: Zosyn 3.375g IV q8h (4 hour infusion).     Temp (24hrs), Avg:98.6 F (37 C), Min:98.6 F (37 C), Max:98.6 F (37 C)  Recent Labs  Lab 05/16/23 0711 05/19/23 1708 05/19/23 1928  WBC 15.1* 23.4*  --   CREATININE 0.54* 1.57*  --   LATICACIDVEN  --  5.9* >9.0*    Estimated Creatinine Clearance: 45.2 mL/min (A) (by C-G formula based on SCr of 1.57 mg/dL (H)).    No Known Allergies  Antimicrobials this admission:   >>    >>   Dose adjustments this admission:   Microbiology results:  BCx:   UCx:    Sputum:    MRSA PCR:   Thank you for allowing pharmacy to be a part of this patient's care.  Mathew Baker D 05/19/2023 10:17 PM

## 2023-05-19 NOTE — ED Notes (Signed)
Ed provider goodman at bedside, will put in sepsis orders

## 2023-05-19 NOTE — ED Notes (Signed)
Patient transported to CT 

## 2023-05-19 NOTE — Progress Notes (Signed)
eLink Physician-Brief Progress Note Patient Name: Mathew Baker DOB: 02-18-1941 MRN: [id number]   Date of Service  05/19/2023  HPI/Events of Note  Patient admitted with severe sepsis suspected to be secondary to acute cholecystitis.  eICU Interventions  New Patient Evaluation.        Migdalia Dk 05/19/2023, 11:21 PM

## 2023-05-19 NOTE — ED Triage Notes (Signed)
Patient comes in from home via [facility] after a near syncope episode. According to ems, the patient was at home using the restroom, he went to stand up and felt dizzy and sat back down. Pt was recently seen in the ER on Monday and was diagnosed with pneumonia,. And is currently on an antibiotic. Pt complains of abdominal pain, back pain and congestion. Pt is alert and oriented x4 with no signs of distress at this time.

## 2023-05-19 NOTE — H&P (Signed)
Mathew Baker, MRN:  [id number], DOB:  Nov 15, 1940, LOS: 0 ADMISSION DATE:  05/19/2023, CONSULTATION DATE:  05/19/23 REFERRING MD:  Dr. Derrill Kay, CHIEF COMPLAINT:  Near syncope   History of Present Illness:  82 yo M presenting to [facility] ED from home via EMS for evaluation of near syncopal episode.  History provided per chart review and bedside interview with patient. Attempted to reach spouse- no answer & daughter by phone- left voicemail. Patient describes feeling poorly since 05/13/23. He was evaluated in the ED on 9/9 for Right-sided chest pain and mildly productive cough, discharged home with antibiotics for possible pneumonia. Ct imaging was negative for PE but did show cholelithiasis without cholecystitis.  Patient reports continued reproducible upper right-sided chest pain, worse when coughing. He also endorses nausea with a couple of episodes of non-bloody bilious emesis and episodes diarrhea starting 9/12. He also reports abdominal distension with RUQ and bilateral lower quadrant tenderness to palpation. The patient reports poor PO intake, but has managed to take in some Boost drinks. He also endorses polyuria and polydipsia. The patient states he has been taking all medication as prescribed with his last dose of Lasix 9/11 and his last dose of Eliquis 9/12 in the AM. He does not wear O2 at home and reports for the last 6 months he has been experiencing chronic dyspnea with exertion as well as orthopnea and PND, but has not noticed any weight changes or increased swelling. He denies fever/chills or any urinary symptoms. He fell 1 month ago, losing consciousness. Today he also reports falling but described it as different, with feeling his legs become weak and go out from under him. He denied hitting his head, he denied blurred vision or and LOC. He normally ambulates with a cane. He denies tobacco use, ETOH use or recreational drug use. He receives most of his care through the [place] [facility].  ED course: Upon arrival patient A&O with stable vitals on 2 L [place] for comfort. Labs suggestive of sepsis with lactic acidosis & leukocytosis, mild AGMA with hyperglycemia and an AKI, as well as mild Transaminitis. Imaging concerning for acute cholecystitis and atelectasis. Lactic increased significantly after IVF resuscitation and antibiotics to  >9.0. ED discussed case with general surgery who recommended percutaneous drain placement. Case discussed with IR on call who will prioritize the patient's case in the AM. Instructed to call back for re-evaluation of emergent intervention if the patient becomes hemodynamically unstable. Medications given: 3 L IVF bolus, cefepime/vancomycin Initial Vitals: 98.6, 19, 89, 98/51 & 100% on 2 L [place] Significant labs: (Labs/ Imaging personally reviewed) I, Cheryll Cockayne Rust-Chester, AGACNP-BC, personally viewed and interpreted this ECG. EKG Interpretation: Date: 05/19/23, EKG Time: 16:57, Rate: 92, Rhythm: NSR with RBBB, QRS Axis:  LAD, Intervals: normal, ST/T Wave abnormalities: none, Narrative Interpretation: NSR with RBBB Chemistry: Na+:139, K+: 4.1, BUN/Cr.: 63/ 1.57, Serum CO2/ AG: 20/16, AST/ALT: 58/78, glucose: 447 Hematology: WBC: 23.4, Hgb: 14.5, plt: 230 BNP: pending, Lactic/ PCT: 5.9- >9.0/ pending, COVID-19: pending VBG: 7.34/ 33/ <31/ 17.8 CXR 05/19/23: no active disease CT abdomen pelvis with contrast 05/19/23:  Findings compatible with acute cholecystitis. Atelectasis in Bilateral lung bases. Stable indeterminate hypodense lesion in the left lobe of the liver. Recommend nonemergent evaluation with MRI. Colonic diverticulosis. Prostatomegaly. Aortic atherosclerosis.  PCCM consulted for admission due to Severe Lactic acidosis with severe Sepsis secondary to Acute Cholecystitis.  Pertinent  Medical History  HLD HTN T2DM CVA HCC s/p Y90 Varices Chronic PTSD COPD Glaucoma  A-fib on Eliquis Carotid artery disease Significant Hospital  Events: Including procedures, antibiotic start and stop dates in addition to other pertinent events   05/19/23: Admit to ICU with Severe Sepsis s/t acute cholecystitis.   Interim History / Subjective:  Patient alert and responsive on 2 L [place]. Plan of care discussed all questions and concerns answered at this time.  Objective   Blood pressure 112/74, pulse 76, temperature 98.6 F (37 C), temperature source Oral, resp. rate 19, SpO2 95%.       No intake or output data in the 24 hours ending 05/19/23 2212 There were no vitals filed for this visit.  Examination: General: Adult male, acutely ill, lying in bed, NAD HEENT: MM pink/moist, anicteric, atraumatic, neck supple, tongue coated white Neuro: A&O x 4, follows commands, PERRL +3, MAE CV: s1s2 RRR, NSR with BBB on monitor, no r/+m/g Pulm: Regular, non labored on 2 L [place], breath sounds coarse-BUL & diminished-BLL GI: distended, rounded, tenderness to mild palpation on the RUQ & BLQ, bs x 4 Skin: scabbed abrasions on LUE Extremities: warm/dry, pulses + 2 R/P, trace edema noted bilateral feet  Resolved Hospital Problem list     Assessment & Plan:  Severe Sepsis due to suspected Acute Cholecystitis Lactic: 5.9- >9.0, Baseline PCT: pending, UA: pending Initial interventions/workup included: 3 L of NS/LR & Cefepime/ Vancomycin - Supplemental oxygen as needed, to maintain SpO2 > 90% - f/u cultures, trend lactic/ PCT - Daily CBC, monitor WBC/ fever curve - IV antibiotics: zosyn  - IVF hydration as needed - Consider vasopressors to maintain MAP< 65: norepinephrine - IR to perform percutaneous drain 9/13, if patient becomes hemodynamically unstable will re-evaluate  Acute Kidney Injury  Mild AGMA Baseline Cr: 0.54, Cr on admission:1.57 - Strict I/O's: alert provider if UOP < 0.5 mL/kg/hr - gentle IVF hydration  - Daily BMP, replace electrolytes PRN - Avoid nephrotoxic agents as able, ensure adequate renal  perfusion  Hyperglycemia With mild AGMA & AKI. CBG- 400's. Hemoglobin A1C: pending Will order Hyperglycemia Crisis order set with insulin drip, reassess after STAT labs - f/u STAT labs including beta-hydroxy, osmolality, repeat VBG & BMP - LR infusion - consider restarting outpatient regimen as patient stabilizes - target range while in ICU: 140-180 - follow ICU hyper/hypo-glycemia protocol - consult Diabetes Coordinator  Chronic COPD without exacerbation - supplemental O2 PRN to maintain Spo2 > 90% - bronchodilators PRN  Near Syncope suspect secondary to poor PO intake & severe infectious process Murmur PMHx: HTN, HLD, A-fib Previous echo 01/2021 revealed LVEF > 55% with trivial TR/MR and mild AS. - echocardiogram ordered - continuous cardiac monitoring - anti-hypertensive medications & lasix on hold due to stable BP, near syncope and sepsis > consider restarting as patient stabilizes - hydralazine PRN for BP > 160/110 - Eliquis on hold due to potential IR procedure 9/13 > SCD's ordered  Mild Transaminitis  PMHx: HCC s/p Y90 tx (2023) - Trend hepatic function - avoid hepatotoxic agents - Lipitor on hold, consider restarting as patient stabilizes  Best Practice (right click and "Reselect all SmartList Selections" daily)  Diet/type: NPO DVT prophylaxis: SCD GI prophylaxis: PPI Lines: N/A Foley:  N/A Code Status:  full code Last date of multidisciplinary goals of care discussion [05/19/23]  Labs   CBC: Recent Labs  Lab 05/16/23 0711 05/19/23 1708  WBC 15.1* 23.4*  NEUTROABS  --  20.4*  HGB 14.0 14.5  HCT 42.7 44.4  MCV 101.2* 101.4*  PLT 202 230    Basic  Metabolic Panel: Recent Labs  Lab 05/16/23 0711 05/19/23 1708  NA 144 139  K 3.0* 4.1  CL 116* 103  CO2 20* 20*  GLUCOSE 170* 447*  BUN 19 63*  CREATININE 0.54* 1.57*  CALCIUM 6.9* 8.4*   GFR: Estimated Creatinine Clearance: 45.2 mL/min (A) (by C-G formula based on SCr of 1.57 mg/dL (H)). Recent Labs   Lab 05/16/23 0711 05/19/23 1708 05/19/23 1928  PROCALCITON <0.10  --   --   WBC 15.1* 23.4*  --   LATICACIDVEN  --  5.9* >9.0*    Liver Function Tests: Recent Labs  Lab 05/16/23 0711 05/19/23 1708  AST 20 78*  ALT 15 38  ALKPHOS 90 115  BILITOT 0.8 1.8*  PROT 5.5* 6.7  ALBUMIN 3.0* 3.0*   Recent Labs  Lab 05/16/23 0711 05/19/23 1708  LIPASE 32 58*   No results for input(s): "AMMONIA" in the last 168 hours.  ABG    Component Value Date/Time   HCO3 17.8 (L) 05/19/2023 1928   ACIDBASEDEF 7.0 (H) 05/19/2023 1928   O2SAT 37.8 05/19/2023 1928     Coagulation Profile: Recent Labs  Lab 05/16/23 0711  INR 1.3*    Cardiac Enzymes: No results for input(s): "CKTOTAL", "CKMB", "CKMBINDEX", "TROPONINI" in the last 168 hours.  HbA1C: Hgb A1c MFr Bld  Date/Time Value Ref Range Status  09/10/2020 04:31 AM 8.0 (H) 4.8 - 5.6 % Final    Comment:    (NOTE) Pre diabetes:          5.7%-6.4%  Diabetes:              >6.4%  Glycemic control for   <7.0% adults with diabetes   01/09/2020 10:04 AM 9.4 (H) 4.8 - 5.6 % Final    Comment:    (NOTE) Pre diabetes:          5.7%-6.4% Diabetes:              >6.4% Glycemic control for   <7.0% adults with diabetes     CBG: No results for input(s): "GLUCAP" in the last 168 hours.  Review of Systems: Positives in BOLD  Gen: Denies fever, chills, weight change, fatigue, night sweats HEENT: Denies blurred vision, double vision, hearing loss, tinnitus, sinus congestion, rhinorrhea, sore throat, neck stiffness, dysphagia PULM: Denies shortness of breath, cough, sputum production, hemoptysis, wheezing CV: Denies chest pain, edema, orthopnea, paroxysmal nocturnal dyspnea, palpitations GI: Denies abdominal pain, nausea, vomiting, diarrhea, hematochezia, melena, constipation, change in bowel habits GU: Denies dysuria, hematuria, polyuria, oliguria, urethral discharge Endocrine: Denies hot or cold intolerance, polyuria, polyphagia or  appetite change Derm: Denies rash, dry skin, scaling or peeling skin change Heme: Denies easy bruising, bleeding, bleeding gums Neuro: Denies headache, numbness, weakness, slurred speech, loss of memory or consciousness  Past Medical History:  He,  has a past medical history of Chronic airway obstruction (HCC), Degeneration of intervertebral disc of lumbar region, Diabetes mellitus without complication (HCC), Essential hypertension, benign, Hyperlipidemia, Hypertension, Osteoarthritis of lower extremity, Pneumonia, organism unspecified(486), PSA elevation (07/16/2013), and Stroke (HCC).   Surgical History:   Past Surgical History:  Procedure Laterality Date   CATARACT EXTRACTION W/ INTRAOCULAR LENS  IMPLANT, BILATERAL     COLONOSCOPY  2006   Normal: Repeat in 10 yrs   LOOP RECORDER INSERTION N/A 02/21/2020   Procedure: LOOP RECORDER INSERTION;  Surgeon: Marcina Millard, MD;  Location: [facility] INVASIVE CV LAB;  Service: Cardiovascular;  Laterality: N/A;   LOOP RECORDER REMOVAL N/A 10/23/2020  Procedure: LOOP RECORDER REMOVAL;  Surgeon: Marcina Millard, MD;  Location: [facility] INVASIVE CV LAB;  Service: Cardiovascular;  Laterality: N/A;   TEE WITHOUT CARDIOVERSION N/A 09/15/2020   Procedure: TRANSESOPHAGEAL ECHOCARDIOGRAM (TEE);  Surgeon: Dalia Heading, MD;  Location: [facility] ORS;  Service: Cardiovascular;  Laterality: N/A;     Social History:   reports that he quit smoking about 64 years ago. His smoking use included cigarettes. He has never used smokeless tobacco. He reports that he does not drink alcohol and does not use drugs.   Family History:  His family history includes Diabetes in his mother.   Allergies No Known Allergies   Home Medications  Prior to Admission medications   Medication Sig Start Date End Date Taking? Authorizing Provider  Alcohol Swabs (ALCOHOL WIPES) 70 % PADS USE FOR INSULIN INJECTIONS OR TO FINGER STICK AS DIRECTED BY YOUR MEDICAL PROVIDER 04/28/20  Yes  [provider]  amoxicillin-clavulanate (AUGMENTIN) 875-125 MG tablet Take 1 tablet by mouth 2 (two) times daily for 7 days. 05/16/23 05/23/23 Yes Sharyn Creamer, MD  atorvastatin (LIPITOR) 80 MG tablet Take 80 mg by mouth daily.   Yes [provider]  azithromycin (ZITHROMAX) 250 MG tablet Take 2 tablets PO on day 1, then take 1 tablet PO daily for 4 more days 05/16/23  Yes Sharyn Creamer, MD  benzonatate (TESSALON PERLES) 100 MG capsule Take 1 capsule (100 mg total) by mouth 2 (two) times daily as needed for cough. 05/16/23  Yes Sharyn Creamer, MD  docusate sodium (COLACE) 100 MG capsule Take 100 mg by mouth 2 (two) times daily. 05/02/23  Yes [provider]  Dulaglutide 4.5 MG/0.5ML SOPN Inject 4.5 mg into the skin every Monday.   Yes [provider]  ELIQUIS 5 MG TABS tablet Take 5 mg by mouth 2 (two) times daily. 08/18/20  Yes [provider]  empagliflozin (JARDIANCE) 25 MG TABS tablet 1 tablet 1 day or 1 dose. 12/09/20  Yes [provider]  glucose blood (PRECISION QID TEST) test strip Use to test blood sugar 2 times daily as instructed. Dx: E11.65 08/22/15  Yes [provider]  HYDROcodone-acetaminophen (NORCO/VICODIN) 5-325 MG tablet Take 1 tablet by mouth every 8 (eight) hours as needed. 02/26/21  Yes [provider]  insulin glargine (LANTUS) 100 UNIT/ML injection Inject 70 Units into the skin 2 (two) times daily.    Yes [provider]  Insulin Pen Needle (PEN NEEDLES 29GX1/2") 29G X MISC Use 1x a day 08/21/14  Yes [provider]  ipratropium (ATROVENT HFA) 17 MCG/ACT inhaler Inhale 2 puffs into the lungs every 6 (six) hours. 09/16/20  Yes Drema Dallas, MD  latanoprost (XALATAN) 0.005 % ophthalmic solution Place 1 drop into both eyes at bedtime.   Yes [provider]  lisinopril (ZESTRIL) 10 MG tablet Take 1 tablet by mouth daily. 12/08/20  Yes [provider]  lisinopril (ZESTRIL) 5 MG tablet  Take 5 mg by mouth daily. 03/03/23  Yes [provider]  Magnesium Oxide 420 MG TABS Take 1 tablet by mouth daily. 10/31/20  Yes [provider]  tamsulosin (FLOMAX) 0.4 MG CAPS capsule Take 0.4 mg by mouth at bedtime.   Yes [provider]  vitamin B-12 (CYANOCOBALAMIN) 100 MCG tablet Take 100 mcg by mouth daily.   Yes [provider]  albuterol (VENTOLIN HFA) 108 (90 Base) MCG/ACT inhaler Inhale 2 puffs into the lungs every 6 (six) hours as needed for wheezing or shortness of  breath. 04/28/20   [provider]  ascorbic acid (VITAMIN C) 500 MG tablet Take 1 tablet (500 mg total) by mouth daily. 09/17/20   Drema Dallas, MD  aspirin EC 81 MG tablet Take 81 mg by mouth at bedtime. Swallow whole.    [provider]  furosemide (LASIX) 20 MG tablet Take 20 mg by mouth daily. 10/13/20   [provider]  insulin aspart (NOVOLOG) 100 UNIT/ML injection Inject 0-9 Units into the skin 3 (three) times daily with meals. Patient taking differently: Inject 0-6 Units into the skin 3 (three) times daily as needed for high blood sugar. 03/25/19   Katha Hamming, MD  sildenafil (VIAGRA) 100 MG tablet Take 1 tablet by mouth as needed. 12/08/20   [provider]     Critical care time: 68 minutes       Betsey Holiday, AGACNP-BC Acute Care Nurse Practitioner [facility]   [phone] / [phone] Please see Amion for details.

## 2023-05-19 NOTE — ED Provider Notes (Signed)
[facility] Provider Note    Event Date/Time   First MD Initiated Contact with Patient 05/19/23 1701     (approximate)   History   Near syncope   HPI  Mathew Baker is a 82 y.o. male who presents to the emergency department today because of concerns for a near syncopal episode.  The patient states that he was diagnosed with pneumonia 3 days ago.  Primary symptom at that time was right sided chest pain. He was started on antibiotics. Since then he feels like things have gotten worse. Today had lightheadedness.    Physical Exam   Triage Vital Signs: ED Triage Vitals  Encounter Vitals Group     BP 05/19/23 1706 (!) 98/51     Systolic BP Percentile --      Diastolic BP Percentile --      Pulse Rate 05/19/23 1706 89     Resp 05/19/23 1706 19     Temp 05/19/23 1706 98.6 F (37 C)     Temp src --      SpO2 05/19/23 1706 95 %     Weight --      Height --      Head Circumference --      Peak Flow --      Pain Score 05/19/23 1657 7     Pain Loc --      Pain Education --      Exclude from Growth Chart --     Most recent vital signs: Vitals:   05/19/23 1706 05/19/23 1710  BP: (!) 98/51   Pulse: 89   Resp: 19   Temp: 98.6 F (37 C)   SpO2: 95% 100%   General: Awake, alert, oriented. CV:  Good peripheral perfusion. Regular rate and rhythm. Resp:  Normal effort. Lungs clear. Abd:  No distention. Tender to palpation.   ED Results / Procedures / Treatments   Labs (all labs ordered are listed, but only abnormal results are displayed) Labs Reviewed  LACTIC ACID, PLASMA - Abnormal; Notable for the following components:      Result Value   Lactic Acid, Venous 5.9 (*)    All other components within normal limits  LACTIC ACID, PLASMA - Abnormal; Notable for the following components:   Lactic Acid, Venous >9.0 (*)    All other components within normal limits  COMPREHENSIVE METABOLIC PANEL - Abnormal; Notable for the following components:   CO2  20 (*)    Glucose, Bld 447 (*)    BUN 63 (*)    Creatinine, Ser 1.57 (*)    Calcium 8.4 (*)    Albumin 3.0 (*)    AST 78 (*)    Total Bilirubin 1.8 (*)    GFR, Estimated 43 (*)    Anion gap 16 (*)    All other components within normal limits  CBC WITH DIFFERENTIAL/PLATELET - Abnormal; Notable for the following components:   WBC 23.4 (*)    MCV 101.4 (*)    Neutro Abs 20.4 (*)    Monocytes Absolute 1.7 (*)    Abs Immature Granulocytes 0.39 (*)    All other components within normal limits  LIPASE, BLOOD - Abnormal; Notable for the following components:   Lipase 58 (*)    All other components within normal limits  URINALYSIS, W/ REFLEX TO CULTURE (INFECTION SUSPECTED) - Abnormal; Notable for the following components:   Color, Urine YELLOW (*)    APPearance CLEAR (*)    Specific Gravity,  Urine 1.035 (*)    Glucose, UA >=500 (*)    Hgb urine dipstick SMALL (*)    All other components within normal limits  BLOOD GAS, VENOUS - Abnormal; Notable for the following components:   pCO2, Ven 33 (*)    pO2, Ven <31 (*)    Bicarbonate 17.8 (*)    Acid-base deficit 7.0 (*)    All other components within normal limits  CBC - Abnormal; Notable for the following components:   WBC 20.9 (*)    RBC 4.16 (*)    All other components within normal limits  MAGNESIUM - Abnormal; Notable for the following components:   Magnesium 3.0 (*)    All other components within normal limits  BASIC METABOLIC PANEL - Abnormal; Notable for the following components:   Glucose, Bld 125 (*)    BUN 58 (*)    Creatinine, Ser 1.26 (*)    Calcium 8.2 (*)    GFR, Estimated 57 (*)    All other components within normal limits  HEPATIC FUNCTION PANEL - Abnormal; Notable for the following components:   Total Protein 6.2 (*)    Albumin 2.7 (*)    AST 60 (*)    Total Bilirubin 1.7 (*)    Bilirubin, Direct 0.5 (*)    Indirect Bilirubin 1.2 (*)    All other components within normal limits  PROTIME-INR - Abnormal;  Notable for the following components:   Prothrombin Time 18.4 (*)    INR 1.5 (*)    All other components within normal limits  GLUCOSE, CAPILLARY - Abnormal; Notable for the following components:   Glucose-Capillary 264 (*)    All other components within normal limits  BASIC METABOLIC PANEL - Abnormal; Notable for the following components:   Glucose, Bld 269 (*)    BUN 61 (*)    Creatinine, Ser 1.30 (*)    Calcium 8.3 (*)    GFR, Estimated 55 (*)    All other components within normal limits  BLOOD GAS, VENOUS - Abnormal; Notable for the following components:   Acid-Base Excess 2.5 (*)    All other components within normal limits  BRAIN NATRIURETIC PEPTIDE - Abnormal; Notable for the following components:   B Natriuretic Peptide 117.4 (*)    All other components within normal limits  BETA-HYDROXYBUTYRIC ACID - Abnormal; Notable for the following components:   Beta-Hydroxybutyric Acid 0.39 (*)    All other components within normal limits  OSMOLALITY - Abnormal; Notable for the following components:   Osmolality 321 (*)    All other components within normal limits  GLUCOSE, CAPILLARY - Abnormal; Notable for the following components:   Glucose-Capillary 252 (*)    All other components within normal limits  GLUCOSE, CAPILLARY - Abnormal; Notable for the following components:   Glucose-Capillary 145 (*)    All other components within normal limits  GLUCOSE, CAPILLARY - Abnormal; Notable for the following components:   Glucose-Capillary 123 (*)    All other components within normal limits  GLUCOSE, CAPILLARY - Abnormal; Notable for the following components:   Glucose-Capillary 140 (*)    All other components within normal limits  GLUCOSE, CAPILLARY - Abnormal; Notable for the following components:   Glucose-Capillary 112 (*)    All other components within normal limits  GLUCOSE, CAPILLARY - Abnormal; Notable for the following components:   Glucose-Capillary 165 (*)    All other  components within normal limits  CULTURE, BLOOD (ROUTINE X 2)  CULTURE, BLOOD (ROUTINE X  2)  SARS CORONAVIRUS 2 BY RT PCR  MRSA NEXT GEN BY PCR, NASAL  EXPECTORATED SPUTUM ASSESSMENT W GRAM STAIN, RFLX TO RESP C  AEROBIC/ANAEROBIC CULTURE W GRAM STAIN (SURGICAL/DEEP WOUND)  PHOSPHORUS  PROCALCITONIN  PROCALCITONIN  LACTIC ACID, PLASMA  LACTIC ACID, PLASMA  HEMOGLOBIN A1C     EKG  I, Phineas Semen, attending physician, personally viewed and interpreted this EKG  EKG Time: 1657 Rate: 92 Rhythm: sinus rhythm Axis: left axis deviation Intervals: qtc 490 QRS: RBBB, LAFB ST changes: no st elevation Impression: abnormal ekg   RADIOLOGY I independently interpreted and visualized the CT abd/pel. My interpretation: cholecystitis Radiology interpretation:  IMPRESSION:  1. Findings compatible with acute cholecystitis.  2. Stable indeterminate hypodense lesion in the left lobe of the  liver. Recommend nonemergent evaluation with MRI.  3. Colonic diverticulosis.  4. Prostatomegaly.  5. Aortic atherosclerosis.    Aortic Atherosclerosis (ICD10-I70.0).   I independently interpreted and visualized the CXR. My interpretation: No pneumonia Radiology interpretation:  IMPRESSION:  No active disease.       PROCEDURES:  Critical Care performed: Yes  CRITICAL CARE Performed by: Phineas Semen   Total critical care time: 35 minutes  Critical care time was exclusive of separately billable procedures and treating other patients.  Critical care was necessary to treat or prevent imminent or life-threatening deterioration.  Critical care was time spent personally by me on the following activities: development of treatment plan with patient and/or surrogate as well as nursing, discussions with consultants, evaluation of patient's response to treatment, examination of patient, obtaining history from patient or surrogate, ordering and performing treatments and interventions,  ordering and review of laboratory studies, ordering and review of radiographic studies, pulse oximetry and re-evaluation of patient's condition.   Procedures    MEDICATIONS ORDERED IN ED: Medications  lactated ringers bolus 1,000 mL (1,000 mLs Intravenous New Bag/Given 05/19/23 1713)     IMPRESSION / MDM / ASSESSMENT AND PLAN / ED COURSE  I reviewed the triage vital signs and the nursing notes.                              Differential diagnosis includes, but is not limited to, pneumonia, UTI, intraabdominal infection  Patient's presentation is most consistent with acute presentation with potential threat to life or bodily function.   The patient is on the cardiac monitor to evaluate for evidence of arrhythmia and/or significant heart rate changes.  Patient presented to the emergency department today because of concerns for worsening symptoms after recent diagnosis of pneumonia and starting oral antibiotics.  Patient's initial blood pressure was low.  However patient was afebrile.  Workup was concerning for significant leukocytosis as well as elevated lactic acid level.  Patient was started on IV fluids and blood pressure did improve.  I did obtain a CT abdomen pelvis given abdominal pain on exam.  This was concerning for acute cholecystitis.  Discussed with Dr. Everlene Farrier with surgery who did not feel patient was a good surgical candidate at this time and recommended percutaneous drain.  Repeat lactic acid continued to rise. Because of this discussed with ICU team who will evaluate for admission.     FINAL CLINICAL IMPRESSION(S) / ED DIAGNOSES   Final diagnoses:  Acute cholecystitis      Note:  This document was prepared using Dragon voice recognition software and may include unintentional dictation errors.    Phineas Semen, MD 05/20/23 [phone]

## 2023-05-20 ENCOUNTER — Inpatient Hospital Stay: Payer: Medicare Other | Admitting: Radiology

## 2023-05-20 DIAGNOSIS — N179 Acute kidney failure, unspecified: Secondary | ICD-10-CM | POA: Diagnosis not present

## 2023-05-20 DIAGNOSIS — K810 Acute cholecystitis: Secondary | ICD-10-CM | POA: Diagnosis not present

## 2023-05-20 DIAGNOSIS — A419 Sepsis, unspecified organism: Secondary | ICD-10-CM

## 2023-05-20 DIAGNOSIS — R6520 Severe sepsis without septic shock: Secondary | ICD-10-CM

## 2023-05-20 HISTORY — PX: IR PERC CHOLECYSTOSTOMY: IMG2326

## 2023-05-20 LAB — HEPATIC FUNCTION PANEL
ALT: 34 U/L (ref 0–44)
AST: 60 U/L — ABNORMAL HIGH (ref 15–41)
Albumin: 2.7 g/dL — ABNORMAL LOW (ref 3.5–5.0)
Alkaline Phosphatase: 98 U/L (ref 38–126)
Bilirubin, Direct: 0.5 mg/dL — ABNORMAL HIGH (ref 0.0–0.2)
Indirect Bilirubin: 1.2 mg/dL — ABNORMAL HIGH (ref 0.3–0.9)
Total Bilirubin: 1.7 mg/dL — ABNORMAL HIGH (ref 0.3–1.2)
Total Protein: 6.2 g/dL — ABNORMAL LOW (ref 6.5–8.1)

## 2023-05-20 LAB — CBC
HCT: 41.4 % (ref 39.0–52.0)
Hemoglobin: 14 g/dL (ref 13.0–17.0)
MCH: 33.7 pg (ref 26.0–34.0)
MCHC: 33.8 g/dL (ref 30.0–36.0)
MCV: 99.5 fL (ref 80.0–100.0)
Platelets: 210 10*3/uL (ref 150–400)
RBC: 4.16 MIL/uL — ABNORMAL LOW (ref 4.22–5.81)
RDW: 13.2 % (ref 11.5–15.5)
WBC: 20.9 10*3/uL — ABNORMAL HIGH (ref 4.0–10.5)
nRBC: 0 % (ref 0.0–0.2)

## 2023-05-20 LAB — BASIC METABOLIC PANEL
Anion gap: 10 (ref 5–15)
BUN: 58 mg/dL — ABNORMAL HIGH (ref 8–23)
CO2: 25 mmol/L (ref 22–32)
Calcium: 8.2 mg/dL — ABNORMAL LOW (ref 8.9–10.3)
Chloride: 107 mmol/L (ref 98–111)
Creatinine, Ser: 1.26 mg/dL — ABNORMAL HIGH (ref 0.61–1.24)
GFR, Estimated: 57 mL/min — ABNORMAL LOW (ref >60)
Glucose, Bld: 125 mg/dL — ABNORMAL HIGH (ref 70–99)
Potassium: 3.5 mmol/L (ref 3.5–5.1)
Sodium: 142 mmol/L (ref 135–145)

## 2023-05-20 LAB — GLUCOSE, CAPILLARY
Glucose-Capillary: 112 mg/dL — ABNORMAL HIGH (ref 70–99)
Glucose-Capillary: 123 mg/dL — ABNORMAL HIGH (ref 70–99)
Glucose-Capillary: 140 mg/dL — ABNORMAL HIGH (ref 70–99)
Glucose-Capillary: 145 mg/dL — ABNORMAL HIGH (ref 70–99)
Glucose-Capillary: 165 mg/dL — ABNORMAL HIGH (ref 70–99)
Glucose-Capillary: 165 mg/dL — ABNORMAL HIGH (ref 70–99)
Glucose-Capillary: 170 mg/dL — ABNORMAL HIGH (ref 70–99)
Glucose-Capillary: 252 mg/dL — ABNORMAL HIGH (ref 70–99)

## 2023-05-20 LAB — PROTIME-INR
INR: 1.5 — ABNORMAL HIGH (ref 0.8–1.2)
Prothrombin Time: 18.4 s — ABNORMAL HIGH (ref 11.4–15.2)

## 2023-05-20 LAB — PROCALCITONIN: Procalcitonin: 6.52 ng/mL

## 2023-05-20 LAB — EXPECTORATED SPUTUM ASSESSMENT W GRAM STAIN, RFLX TO RESP C

## 2023-05-20 LAB — OSMOLALITY: Osmolality: 321 mosm/kg (ref 275–295)

## 2023-05-20 LAB — PHOSPHORUS: Phosphorus: 2.8 mg/dL (ref 2.5–4.6)

## 2023-05-20 LAB — MAGNESIUM: Magnesium: 3 mg/dL — ABNORMAL HIGH (ref 1.7–2.4)

## 2023-05-20 LAB — MRSA NEXT GEN BY PCR, NASAL: MRSA by PCR Next Gen: NOT DETECTED

## 2023-05-20 LAB — LACTIC ACID, PLASMA: Lactic Acid, Venous: 1.3 mmol/L (ref 0.5–1.9)

## 2023-05-20 MED ORDER — LACTATED RINGERS IV SOLN
INTRAVENOUS | Status: DC
  Administered 2023-05-20: 75 mL/h via INTRAVENOUS

## 2023-05-20 MED ORDER — HYDROCODONE-ACETAMINOPHEN 5-325 MG PO TABS
1.0000 | ORAL_TABLET | Freq: Four times a day (QID) | ORAL | Status: DC | PRN
  Administered 2023-05-20 – 2023-05-21 (×2): 2 via ORAL
  Filled 2023-05-20 (×2): qty 2

## 2023-05-20 MED ORDER — ORAL CARE MOUTH RINSE: 15.0000 mL | OROMUCOSAL | Status: DC | PRN

## 2023-05-20 MED ORDER — HYDROMORPHONE HCL 1 MG/ML IJ SOLN: 0.5000 mg | INTRAMUSCULAR | Status: DC | PRN

## 2023-05-20 MED ORDER — LIDOCAINE HCL 1 % IJ SOLN
INTRAMUSCULAR | Status: AC
  Filled 2023-05-20: qty 20

## 2023-05-20 MED ORDER — MIDAZOLAM HCL 2 MG/2ML IJ SOLN
INTRAMUSCULAR | Status: AC
  Filled 2023-05-20: qty 2

## 2023-05-20 MED ORDER — MIDAZOLAM HCL 5 MG/5ML IJ SOLN
INTRAMUSCULAR | Status: AC | PRN
Start: 2023-05-20 — End: 2023-05-20
  Administered 2023-05-20: 1 mg via INTRAVENOUS

## 2023-05-20 MED ORDER — LIDOCAINE HCL 1 % IJ SOLN
10.0000 mL | Freq: Once | INTRAMUSCULAR | Status: AC
  Administered 2023-05-20: 8 mL via INTRADERMAL

## 2023-05-20 MED ORDER — ACETAMINOPHEN 10 MG/ML IV SOLN
1000.0000 mg | Freq: Four times a day (QID) | INTRAVENOUS | Status: DC
  Administered 2023-05-20: 1000 mg via INTRAVENOUS
  Filled 2023-05-20 (×3): qty 100

## 2023-05-20 MED ORDER — ENSURE ENLIVE PO LIQD
237.0000 mL | Freq: Three times a day (TID) | ORAL | Status: DC
  Administered 2023-05-20 – 2023-05-24 (×8): 237 mL via ORAL

## 2023-05-20 MED ORDER — FENTANYL CITRATE (PF) 100 MCG/2ML IJ SOLN
INTRAMUSCULAR | Status: AC
  Filled 2023-05-20: qty 2

## 2023-05-20 MED ORDER — ADULT MULTIVITAMIN W/MINERALS CH
1.0000 | ORAL_TABLET | Freq: Every day | ORAL | Status: DC
  Administered 2023-05-21 – 2023-05-24 (×4): 1 via ORAL
  Filled 2023-05-20 (×4): qty 1

## 2023-05-20 MED ORDER — SODIUM CHLORIDE 0.9% FLUSH
5.0000 mL | Freq: Three times a day (TID) | INTRAVENOUS | Status: DC
  Administered 2023-05-20 – 2023-05-24 (×12): 5 mL

## 2023-05-20 MED ORDER — FENTANYL CITRATE (PF) 100 MCG/2ML IJ SOLN
INTRAMUSCULAR | Status: AC | PRN
Start: 2023-05-20 — End: 2023-05-20
  Administered 2023-05-20: 50 ug via INTRAVENOUS

## 2023-05-20 MED ORDER — IOHEXOL 300 MG/ML  SOLN
10.0000 mL | Freq: Once | INTRAMUSCULAR | Status: AC | PRN
  Administered 2023-05-20: 10 mL

## 2023-05-20 NOTE — Progress Notes (Addendum)
0800 Patient complains of RLQ pain. MD notified and IV Tylenol ordered. 4098 Tylenol IV given for pain. 0900 Down to Specials for his procedure. Daughter with patient. 1200 Back from specials.

## 2023-05-20 NOTE — Progress Notes (Signed)
Initial Nutrition Assessment  DOCUMENTATION CODES:   Not applicable  INTERVENTION:   Ensure Enlive po TID, each supplement provides 350 kcal and 20 grams of protein.  MVI po daily   Pt at high refeed risk; recommend monitor potassium, magnesium and phosphorus labs daily until stable  NUTRITION DIAGNOSIS:   Inadequate oral intake related to acute illness as evidenced by per patient/family report.  GOAL:   Patient will meet greater than or equal to 90% of their needs  MONITOR:   PO intake, Supplement acceptance, Labs, Weight trends, I & O's, Skin  REASON FOR ASSESSMENT:   Malnutrition Screening Tool    ASSESSMENT:   82 y/o male with h/o DM, HTN, HLD, Afib, CAD, IDA and COPD who is admitted with sepsis, AKI, acute cholecystitis and DKA.  RD unable to see pt today as pt in a procedure at the time of the RD visit. Per chart review, pt reports feeling bad for ~ 1 week pta. Pt reports nausea and vomiting that started one day pta. Pt also reports ongoing abdominal pain. RD will add supplements and MVI to help pt meet his estimated needs. Per chart, pt appears weight stable at baseline. RD will obtain nutrition related history and exam at follow-up.   Pt s/p percutaneous transhepatic cholecystostomy tube placement today  Medications reviewed and include: insulin, protonix, LRS @75ml /hr, zosyn   Labs reviewed: K 3.5 wnl, BUN 58(H), creat 1.26(H), P 2.8 wnl, Mg 3.0(H) Wbc- 20.9(H) Cbgs- 140, 123, 112, 145, 252 x 24 hrs   NUTRITION - FOCUSED PHYSICAL EXAM: Unable to perform at this time   Diet Order:   Diet Order             Diet regular Room service appropriate? Yes; Fluid consistency: Thin  Diet effective now                  EDUCATION NEEDS:   Not appropriate for education at this time  Skin:  Skin Assessment: Reviewed RN Assessment (ecchymosis)  Last BM:  9/13  Height:   Ht Readings from Last 1 Encounters:  05/20/23 6\' 1"  (1.854 m)    Weight:   Wt  Readings from Last 1 Encounters:  05/20/23 104.8 kg    Ideal Body Weight:  83.6 kg  BMI:  Body mass index is 30.48 kg/m.  Estimated Nutritional Needs:   Kcal:  2100-2400kcal/day  Protein:  105-120g/day  Fluid:  2.1-2.4L/day  Betsey Holiday MS, RD, LDN Please refer to [facility] for RD and/or RD on-call/weekend/after hours pager

## 2023-05-20 NOTE — TOC Progression Note (Signed)
Transition of Care [facility]) - Progression Note    Patient Details  Name: Mathew Baker MRN: [id number] Date of Birth: 05/28/1941  Transition of Care [facility]) CM/SW Contact  Garret Reddish, RN Phone Number: 05/20/2023, 12:31 PM  Clinical Narrative:   Patient was admitted with Severe Sepsis secondary acute cholecystitis. Noted that patient had percutaneous transhepatic cholecystostomy tube placement today by IR.  TOC will continue to follow for discharge planning.               Expected Discharge Plan and Services                                               Social Determinants of Health (SDOH) Interventions SDOH Screenings   Food Insecurity: No Food Insecurity (05/19/2023)  Housing: Low Risk  (05/19/2023)  Transportation Needs: No Transportation Needs (05/19/2023)  Utilities: Not At Risk (05/19/2023)  Tobacco Use: Medium Risk (05/19/2023)    Readmission Risk Interventions     No data to display

## 2023-05-20 NOTE — Consult Note (Signed)
Patient ID: Mathew Baker, male   DOB: 05-16-1941, 82 y.o.   MRN: [id number]  HPI Mathew Baker is a 82 y.o. male seen in consultation for cholecystitis. He does have  multiple comorbidities including coronary artery disease, A-fib on anticoagulation, diabetes, COPD, HTN,  prior history of stroke.  HE  Does have limited cardiovascular reserve.  He described feeling bad for the last 10 days or so.  He reports right sided abdominal pain with pain being severe constant worsening with deep inspiration.  Also reports episode of emesis nausea and decreased appetite.  He did have diarrhea as well. He was seen in the emergency room a few days ago for pneumonia was sent home with antibiotics. He does have limited cardiovascular reserve and states that he is able to walk at home with some limitations.  Currently anticoagulated. He did have a CT scan of the abdomen pelvis that I personally reviewed showing evidence of cholecystitis with cholelithiasis.  Normal common bile duct.  LFTs showed normal direct bilirubin.  Mild increase in LFTs.  An acute kidney injury with acidosis thatis resolving. Prior abdominal operations  HPI  Past Medical History:  Diagnosis Date   Chronic airway obstruction (HCC)    Degeneration of intervertebral disc of lumbar region    Diabetes mellitus without complication (HCC)    Essential hypertension, benign    Hyperlipidemia    Hypertension    Osteoarthritis of lower extremity    Pneumonia, organism unspecified(486)    PSA elevation 07/16/2013   Normal   Stroke [facility])     Past Surgical History:  Procedure Laterality Date   CATARACT EXTRACTION W/ INTRAOCULAR LENS  IMPLANT, BILATERAL     COLONOSCOPY  2006   Normal: Repeat in 10 yrs   LOOP RECORDER INSERTION N/A 02/21/2020   Procedure: LOOP RECORDER INSERTION;  Surgeon: Marcina Millard, MD;  Location: [facility] INVASIVE CV LAB;  Service: Cardiovascular;  Laterality: N/A;   LOOP RECORDER REMOVAL N/A 10/23/2020   Procedure:  LOOP RECORDER REMOVAL;  Surgeon: Marcina Millard, MD;  Location: [facility] INVASIVE CV LAB;  Service: Cardiovascular;  Laterality: N/A;   TEE WITHOUT CARDIOVERSION N/A 09/15/2020   Procedure: TRANSESOPHAGEAL ECHOCARDIOGRAM (TEE);  Surgeon: Dalia Heading, MD;  Location: [facility] ORS;  Service: Cardiovascular;  Laterality: N/A;    Family History  Problem Relation Age of Onset   Diabetes Mother     Social History Social History   Tobacco Use   Smoking status: Former    Current packs/day: 0.00    Types: Cigarettes    Quit date: 09/06/1958    Years since quitting: 64.7   Smokeless tobacco: Never  Substance Use Topics   Alcohol use: No    Alcohol/week: 0.0 standard drinks of alcohol   Drug use: No    No Known Allergies  Current Facility-Administered Medications  Medication Dose Route Frequency Provider Last Rate Last Admin   acetaminophen (TYLENOL) tablet 650 mg  650 mg Oral Q4H PRN Rust-Chester, Cecelia Byars, NP       Chlorhexidine Gluconate Cloth 2 % PADS 6 each  6 each Topical Daily Kasa, Kurian, MD       docusate sodium (COLACE) capsule 100 mg  100 mg Oral BID PRN Rust-Chester, Micheline Rough L, NP       hydrALAZINE (APRESOLINE) injection 10 mg  10 mg Intravenous Q4H PRN Rust-Chester, Britton L, NP       insulin aspart (novoLOG) injection 0-20 Units  0-20 Units Subcutaneous Q4H Rust-Chester, Cecelia Byars, NP   3 Units  at 05/20/23 0340   ipratropium-albuterol (DUONEB) 0.5-2.5 (3) MG/3ML nebulizer solution 3 mL  3 mL Nebulization Q4H PRN Rust-Chester, Britton L, NP       latanoprost (XALATAN) 0.005 % ophthalmic solution 1 drop  1 drop Both Eyes QHS Rust-Chester, Britton L, NP   1 drop at 05/20/23 0024   Oral care mouth rinse  15 mL Mouth Rinse PRN Rust-Chester, Micheline Rough L, NP       pantoprazole (PROTONIX) EC tablet 40 mg  40 mg Oral Daily Rust-Chester, Britton L, NP       piperacillin-tazobactam (ZOSYN) IVPB 3.375 g  3.375 g Intravenous Q8H Kasa, Kurian, MD 12.5 mL/hr at 05/20/23 0531 3.375 g at  05/20/23 0531   polyethylene glycol (MIRALAX / GLYCOLAX) packet 17 g  17 g Oral Daily PRN Rust-Chester, Cecelia Byars, NP         Review of Systems Full ROS  was asked and was negative except for the information on the HPI  Physical Exam Blood pressure 121/61, pulse 76, temperature 98.8 F (37.1 C), temperature source Oral, resp. rate 16, height 6\' 1"  (1.854 m), weight 104.8 kg, SpO2 95%. CONSTITUTIONAL: Debilitated  EYES: Pupils are equal, round,, Sclera are non-icteric. EARS, NOSE, MOUTH AND THROAT: The oropharynx is clear. The oral mucosa is pink and moist. Hearing is intact to voice. LYMPH NODES:  Lymph nodes in the neck are normal. RESPIRATORY:  Lungs are clear. There is normal respiratory effort, with equal breath sounds bilaterally, and without pathologic use of accessory muscles. CARDIOVASCULAR: Heart is regular without murmurs, gallops, or rubs. GI: The abdomen is  soft, TTP RUQ w + Murphy sign There are no palpable masses. There is no hepatosplenomegaly. There are normal bowel sounds. GU: Rectal deferred.   MUSCULOSKELETAL: Normal muscle strength and tone. No cyanosis or edema.   SKIN: Turgor is good and there are no pathologic skin lesions or ulcers. NEUROLOGIC: Motor and sensation is grossly normal. Cranial nerves are grossly intact. PSYCH:  Oriented to person, place and time. Affect is normal.  Data Reviewed  I have personally reviewed the patient's imaging, laboratory findings and medical records.    Assessment/Plan 82 year old male with multiple comorbidities including coronary artery disease, A-fib on anticoagulation, diabetes, HTN, COPD, prior history of stroke.  HE  Does have limited cardiovascular reserve.  He Was admitted for acute cholecystitis and sepsis.  Definitely recommend cholecystostomy tube, antibiotics and continue resuscitation efforts. I do not think cholecystectomy will be in his best interest during the acute setting  given his poor physiologic reserve and  increased risk of perioperative complications. IR team aware. We will consider elective cholecystectomy in 3 months or so. We will continue to follow I Spent 75 minutes in this encounter including personal review of imaging studies, coordinating his care, placing orders and performing documentation  Sterling Big, MD FACS General Surgeon 05/20/2023, 7:51 AM

## 2023-05-20 NOTE — Progress Notes (Signed)
Dr. Archer Asa in at bedside, speaking with pt. And his daughter re: placement of chole tube. Both verbalized understanding of conversation.

## 2023-05-20 NOTE — Consult Note (Signed)
Chief Complaint: Patient was seen in consultation today for acute cholecystitis  Referring Physician(s): San Jetty, NP  Supervising Physician: Malachy Moan  Patient Status: [facility] - In-pt  History of Present Illness: Mathew Baker is a 82 y.o. male with PMH significant for chronic airway obstruction, diabetes mellitus, hypertension, and history of stroke being seen today in relation to suspected acute cholecystitis. Patient has reported 7 day history of feeling poorly with right-sided chest pain and bilious vomiting. Patient presented to [facility] ED on 05/19/23 for the above and was found to have severe sepsis with lactic acid of 9.0. CT AP w contrast revealed findings compatible with acute cholecystitis. Sepsis is felt to be related to acute cholecystitis. IR was consulted to evaluate patient for possible percutaneous cholecystostomy placement.   Past Medical History:  Diagnosis Date   Chronic airway obstruction (HCC)    Degeneration of intervertebral disc of lumbar region    Diabetes mellitus without complication (HCC)    Essential hypertension, benign    Hyperlipidemia    Hypertension    Osteoarthritis of lower extremity    Pneumonia, organism unspecified(486)    PSA elevation 07/16/2013   Normal   Stroke [facility])     Past Surgical History:  Procedure Laterality Date   CATARACT EXTRACTION W/ INTRAOCULAR LENS  IMPLANT, BILATERAL     COLONOSCOPY  2006   Normal: Repeat in 10 yrs   LOOP RECORDER INSERTION N/A 02/21/2020   Procedure: LOOP RECORDER INSERTION;  Surgeon: Marcina Millard, MD;  Location: [facility] INVASIVE CV LAB;  Service: Cardiovascular;  Laterality: N/A;   LOOP RECORDER REMOVAL N/A 10/23/2020   Procedure: LOOP RECORDER REMOVAL;  Surgeon: Marcina Millard, MD;  Location: [facility] INVASIVE CV LAB;  Service: Cardiovascular;  Laterality: N/A;   TEE WITHOUT CARDIOVERSION N/A 09/15/2020   Procedure: TRANSESOPHAGEAL ECHOCARDIOGRAM (TEE);  Surgeon: Dalia Heading, MD;  Location: [facility] ORS;  Service: Cardiovascular;  Laterality: N/A;    Allergies: Patient has no known allergies.  Medications: Prior to Admission medications   Medication Sig Start Date End Date Taking? Authorizing Provider  albuterol (VENTOLIN HFA) 108 (90 Base) MCG/ACT inhaler Inhale 2 puffs into the lungs every 6 (six) hours as needed for wheezing or shortness of breath. 04/28/20  Yes [provider]  Alcohol Swabs (ALCOHOL WIPES) 70 % PADS USE FOR INSULIN INJECTIONS OR TO FINGER STICK AS DIRECTED BY YOUR MEDICAL PROVIDER 04/28/20  Yes [provider]  amoxicillin-clavulanate (AUGMENTIN) 875-125 MG tablet Take 1 tablet by mouth 2 (two) times daily for 7 days. 05/16/23 05/23/23 Yes Sharyn Creamer, MD  atorvastatin (LIPITOR) 80 MG tablet Take 80 mg by mouth daily.   Yes [provider]  azithromycin (ZITHROMAX) 250 MG tablet Take 2 tablets PO on day 1, then take 1 tablet PO daily for 4 more days 05/16/23  Yes Sharyn Creamer, MD  benzonatate (TESSALON PERLES) 100 MG capsule Take 1 capsule (100 mg total) by mouth 2 (two) times daily as needed for cough. 05/16/23  Yes Sharyn Creamer, MD  docusate sodium (COLACE) 100 MG capsule Take 100 mg by mouth 2 (two) times daily. 05/02/23  Yes [provider]  Dulaglutide 4.5 MG/0.5ML SOPN Inject 4.5 mg into the skin every Monday.   Yes [provider]  ELIQUIS 5 MG TABS tablet Take 5 mg by mouth 2 (two) times daily. 08/18/20  Yes [provider]  empagliflozin (JARDIANCE) 25 MG TABS tablet 1 tablet 1 day or 1 dose. 12/09/20  Yes [provider]  glucose  blood (PRECISION QID TEST) test strip Use to test blood sugar 2 times daily as instructed. Dx: E11.65 08/22/15  Yes [provider]  HYDROcodone-acetaminophen (NORCO/VICODIN) 5-325 MG tablet Take 1 tablet by mouth every 8 (eight) hours as needed. 02/26/21  Yes [provider]  insulin glargine (LANTUS) 100 UNIT/ML injection Inject 70 Units into  the skin 2 (two) times daily.    Yes [provider]  Insulin Pen Needle (PEN NEEDLES 29GX1/2") 29G X MISC Use 1x a day 08/21/14  Yes [provider]  ipratropium (ATROVENT HFA) 17 MCG/ACT inhaler Inhale 2 puffs into the lungs every 6 (six) hours. 09/16/20  Yes Drema Dallas, MD  latanoprost (XALATAN) 0.005 % ophthalmic solution Place 1 drop into both eyes at bedtime.   Yes [provider]  lisinopril (ZESTRIL) 10 MG tablet Take 1 tablet by mouth daily. 12/08/20  Yes [provider]  lisinopril (ZESTRIL) 5 MG tablet Take 5 mg by mouth daily. 03/03/23  Yes [provider]  Magnesium Oxide 420 MG TABS Take 1 tablet by mouth daily. 10/31/20  Yes [provider]  tamsulosin (FLOMAX) 0.4 MG CAPS capsule Take 0.4 mg by mouth at bedtime.   Yes [provider]  vitamin B-12 (CYANOCOBALAMIN) 100 MCG tablet Take 100 mcg by mouth daily.   Yes [provider]  ascorbic acid (VITAMIN C) 500 MG tablet Take 1 tablet (500 mg total) by mouth daily. 09/17/20   Drema Dallas, MD  aspirin EC 81 MG tablet Take 81 mg by mouth at bedtime. Swallow whole.    [provider]  furosemide (LASIX) 20 MG tablet Take 20 mg by mouth daily. 10/13/20   [provider]  insulin aspart (NOVOLOG) 100 UNIT/ML injection Inject 0-9 Units into the skin 3 (three) times daily with meals. Patient taking differently: Inject 0-6 Units into the skin 3 (three) times daily as needed for high blood sugar. 03/25/19   Katha Hamming, MD  sildenafil (VIAGRA) 100 MG tablet Take 1 tablet by mouth as needed. 12/08/20   [provider]     Family History  Problem Relation Age of Onset   Diabetes Mother     Social History   Socioeconomic History   Marital status: Married    Spouse name: Not on file   Number of children: Not on file   Years of education: Not on file   Highest education level: Not on file  Occupational History   Not on file   Tobacco Use   Smoking status: Former    Current packs/day: 0.00    Types: Cigarettes    Quit date: 09/06/1958    Years since quitting: 64.7   Smokeless tobacco: Never  Substance and Sexual Activity   Alcohol use: No    Alcohol/week: 0.0 standard drinks of alcohol   Drug use: No   Sexual activity: Not on file  Other Topics Concern   Not on file  Social History Narrative   Married   Retired; 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; One of the original [employer] in the early 60's   1 daughter   Social Determinants of Health   Financial Resource Strain: Not on file  Food Insecurity: No Food Insecurity (05/19/2023)   Hunger Vital Sign    Worried About Running Out of Food in the Last Year: Never true    Ran Out of Food in the Last Year: Never true  Transportation Needs: No Transportation Needs (05/19/2023)   PRAPARE - Transportation  Lack of Transportation (Medical): No    Lack of Transportation (Non-Medical): No  Physical Activity: Not on file  Stress: Not on file  Social Connections: Not on file    Code Status: Full code  Review of Systems: A 12 point ROS discussed and pertinent positives are indicated in the HPI above.  All other systems are negative.  Review of Systems  Constitutional:  Negative for chills and fever.  Respiratory:  Positive for shortness of breath. Negative for chest tightness.   Cardiovascular:  Negative for chest pain and leg swelling.  Gastrointestinal:  Positive for abdominal pain, diarrhea, nausea and vomiting.  Neurological:  Positive for headaches. Negative for dizziness.  Psychiatric/Behavioral:  Negative for confusion.     Vital Signs: BP 121/61   Pulse 76   Temp 98.8 F (37.1 C) (Oral)   Resp 16   Ht 6\' 1"  (1.854 m)   Wt 231 lb 0.7 oz (104.8 kg)   SpO2 95%   BMI 30.48 kg/m    Physical Exam Vitals reviewed.  Constitutional:      General: He is not in acute distress.    Appearance: He is ill-appearing.  Cardiovascular:     Rate and Rhythm: Normal  rate and regular rhythm.     Pulses: Normal pulses.     Heart sounds: Normal heart sounds.  Pulmonary:     Effort: Pulmonary effort is normal.     Breath sounds: Normal breath sounds.  Abdominal:     Palpations: Abdomen is soft.     Tenderness: There is abdominal tenderness.     Comments: RUQ tenderness  Musculoskeletal:     Right lower leg: No edema.     Left lower leg: No edema.  Skin:    General: Skin is warm and dry.  Neurological:     Mental Status: He is alert and oriented to person, place, and time.  Psychiatric:        Mood and Affect: Mood normal.        Behavior: Behavior normal.        Thought Content: Thought content normal.        Judgment: Judgment normal.     Imaging: CT ABDOMEN PELVIS W CONTRAST  Result Date: 05/19/2023 CLINICAL DATA:  Acute abdominal pain EXAM: CT ABDOMEN AND PELVIS WITH CONTRAST TECHNIQUE: Multidetector CT imaging of the abdomen and pelvis was performed using the standard protocol following bolus administration of intravenous contrast. RADIATION DOSE REDUCTION: This exam was performed according to the departmental dose-optimization program which includes automated exposure control, adjustment of the mA and/or kV according to patient size and/or use of iterative reconstruction technique. CONTRAST:  80mL OMNIPAQUE IOHEXOL 300 MG/ML  SOLN COMPARISON:  CT abdomen and pelvis 05/16/2023 FINDINGS: Lower chest: There is atelectasis in the lung bases. Hepatobiliary: Indeterminate hypodense lesion in the left lobe of the liver measuring up to 2.2 cm appears unchanged. No new liver lesions are identified. Gallstones are present. The gallbladder is dilated. There is pericholecystic inflammation and fluid which is new from prior. There is no biliary ductal dilatation. Pancreas: Unremarkable. No pancreatic ductal dilatation or surrounding inflammatory changes. Spleen: Normal in size without focal abnormality. Adrenals/Urinary Tract: Adrenal glands are unremarkable.  Kidneys are normal, without renal calculi, focal lesion, or hydronephrosis. Bladder is unremarkable. Stomach/Bowel: Stomach is within normal limits. Appendix appears normal. No evidence of bowel wall thickening, distention, or inflammatory changes. There are scattered colonic diverticula. Vascular/Lymphatic: Aortic atherosclerosis. No enlarged abdominal or pelvic lymph nodes. Reproductive: The  prostate gland is enlarged. Other: There is a small fat containing umbilical hernia. There is trace free fluid in the right upper quadrant. Musculoskeletal: No acute osseous findings. Mild chronic compression deformity of L1 is unchanged IMPRESSION: 1. Findings compatible with acute cholecystitis. 2. Stable indeterminate hypodense lesion in the left lobe of the liver. Recommend nonemergent evaluation with MRI. 3. Colonic diverticulosis. 4. Prostatomegaly. 5. Aortic atherosclerosis. Aortic Atherosclerosis (ICD10-I70.0). Electronically Signed   By: Darliss Cheney M.D.   On: 05/19/2023 20:08   DG Chest Port 1 View  Result Date: 05/19/2023 CLINICAL DATA:  Questionable sepsis EXAM: PORTABLE CHEST 1 VIEW COMPARISON:  Chest x-ray 05/16/2023 FINDINGS: The heart size and mediastinal contours are within normal limits. Both lungs are clear. The visualized skeletal structures are unremarkable. IMPRESSION: No active disease. Electronically Signed   By: Darliss Cheney M.D.   On: 05/19/2023 19:37   CT Angio Chest PE W and/or Wo Contrast  Result Date: 05/16/2023 CLINICAL DATA:  Pulmonary embolism suspected, high probability. Right-sided pleuritic chest pain. Episode of coughing in the middle the night with subsequent right chest and abdominal pain. EXAM: CT ANGIOGRAPHY CHEST CT ABDOMEN AND PELVIS WITH CONTRAST TECHNIQUE: Multidetector CT imaging of the chest was performed using the standard protocol during bolus administration of intravenous contrast. Multiplanar CT image reconstructions and MIPs were obtained to evaluate the vascular  anatomy. Multidetector CT imaging of the abdomen and pelvis was performed using the standard protocol during bolus administration of intravenous contrast. RADIATION DOSE REDUCTION: This exam was performed according to the departmental dose-optimization program which includes automated exposure control, adjustment of the mA and/or kV according to patient size and/or use of iterative reconstruction technique. CONTRAST:  OMNIPAQUE IOHEXOL 350 MG/ML SOLN COMPARISON:  Two-view chest x-ray 05/16/2023 FINDINGS: CTA CHEST FINDINGS Cardiovascular: The heart is mildly enlarged. Coronary artery calcifications are present. Atherosclerotic calcifications are present in the distal arch and extending superiorly in the left subclavian artery without a significant stenosis relative to the more distal vessels. Pulmonary artery opacification is excellent. No focal filling defect is present to suggest pulmonary embolus. Pulmonary artery size is normal. Mediastinum/Nodes: No enlarged mediastinal, hilar, or axillary lymph nodes. Thyroid gland, trachea, and esophagus demonstrate no significant findings. Lungs/Pleura: Patchy peribronchial airspace opacities are present in the right upper lobe, anteriorly in the right middle lobe and superior segment of the right lower lobe. Mild dependent atelectasis is present in the left lung. No pleural disease is present. Musculoskeletal: Remote anterior right-sided rib fractures are noted. No acute fractures are present. Incidental note is made of bilateral gynecomastia. Review of the MIP images confirms the above findings. CT ABDOMEN and PELVIS FINDINGS Hepatobiliary: The gallbladder is moderately distended. Dependent gallstones are present without inflammatory change. A low-density lesion superiorly within the left lobe of the liver is not clearly a simple cyst. Dilated intrahepatic biliary ducts are present anteriorly and to the left of the lesion. Is some nodularity of the liver. The caudate  lobe is enlarged. Pancreas: Unremarkable. No pancreatic ductal dilatation or surrounding inflammatory changes. Spleen: Normal in size without focal abnormality. Adrenals/Urinary Tract: Adrenal glands are normal bilaterally. Kidneys are unremarkable. No stone or mass lesion is present. No obstruction is present. The ureters are within normal limits. The urinary bladder is normal. Stomach/Bowel: Stomach and duodenum are within normal limits. Small bowel is unremarkable. The terminal ileum is normal. The appendix is visualized and normal. The ascending and transverse colon are within normal limits. The descending and sigmoid colon are normal. Vascular/Lymphatic:  Atherosclerotic calcifications are present in the aorta and branch vessels. No aneurysm is present. No significant adenopathy is present. Reproductive: The prostate is enlarged, measuring 6.5 cm in transverse diameter. Seminal vesicles are unremarkable. Other: No abdominal wall hernia or abnormality. No abdominopelvic ascites. Musculoskeletal: The vertebral body heights and alignment are normal. A remote superior endplate L1 fracture is present. Straightening of the normal lumbar lordosis is present. Bony pelvis is within normal limits. Mild degenerative changes are present at both hips. Review of the MIP images confirms the above findings. IMPRESSION: 1. No pulmonary embolus. 2. Patchy peribronchial airspace opacities in the right upper lobe, anteriorly in the right middle lobe and superior segment of the right lower lobe most likely represents inflammation or early infection. 3. Cardiomegaly without failure. 4. Coronary artery disease. 5. Cholelithiasis without evidence for cholecystitis. 6. Low-density lesion superiorly within the left lobe of the liver is not clearly a simple cyst. Dilated intrahepatic biliary ducts are present anteriorly and to the left of the lesion. Recommend non emergent MRI of the abdomen without and with contrast for further  evaluation. 7. Enlarged prostate gland. 8. Remote superior endplate L1 fracture. 9.  Aortic Atherosclerosis (ICD10-I70.0). Electronically Signed   By: Marin Roberts M.D.   On: 05/16/2023 09:38   CT ABDOMEN PELVIS W CONTRAST  Result Date: 05/16/2023 CLINICAL DATA:  Pulmonary embolism suspected, high probability. Right-sided pleuritic chest pain. Episode of coughing in the middle the night with subsequent right chest and abdominal pain. EXAM: CT ANGIOGRAPHY CHEST CT ABDOMEN AND PELVIS WITH CONTRAST TECHNIQUE: Multidetector CT imaging of the chest was performed using the standard protocol during bolus administration of intravenous contrast. Multiplanar CT image reconstructions and MIPs were obtained to evaluate the vascular anatomy. Multidetector CT imaging of the abdomen and pelvis was performed using the standard protocol during bolus administration of intravenous contrast. RADIATION DOSE REDUCTION: This exam was performed according to the departmental dose-optimization program which includes automated exposure control, adjustment of the mA and/or kV according to patient size and/or use of iterative reconstruction technique. CONTRAST:  OMNIPAQUE IOHEXOL 350 MG/ML SOLN COMPARISON:  Two-view chest x-ray 05/16/2023 FINDINGS: CTA CHEST FINDINGS Cardiovascular: The heart is mildly enlarged. Coronary artery calcifications are present. Atherosclerotic calcifications are present in the distal arch and extending superiorly in the left subclavian artery without a significant stenosis relative to the more distal vessels. Pulmonary artery opacification is excellent. No focal filling defect is present to suggest pulmonary embolus. Pulmonary artery size is normal. Mediastinum/Nodes: No enlarged mediastinal, hilar, or axillary lymph nodes. Thyroid gland, trachea, and esophagus demonstrate no significant findings. Lungs/Pleura: Patchy peribronchial airspace opacities are present in the right upper lobe, anteriorly in  the right middle lobe and superior segment of the right lower lobe. Mild dependent atelectasis is present in the left lung. No pleural disease is present. Musculoskeletal: Remote anterior right-sided rib fractures are noted. No acute fractures are present. Incidental note is made of bilateral gynecomastia. Review of the MIP images confirms the above findings. CT ABDOMEN and PELVIS FINDINGS Hepatobiliary: The gallbladder is moderately distended. Dependent gallstones are present without inflammatory change. A low-density lesion superiorly within the left lobe of the liver is not clearly a simple cyst. Dilated intrahepatic biliary ducts are present anteriorly and to the left of the lesion. Is some nodularity of the liver. The caudate lobe is enlarged. Pancreas: Unremarkable. No pancreatic ductal dilatation or surrounding inflammatory changes. Spleen: Normal in size without focal abnormality. Adrenals/Urinary Tract: Adrenal glands are normal bilaterally. Kidneys are  unremarkable. No stone or mass lesion is present. No obstruction is present. The ureters are within normal limits. The urinary bladder is normal. Stomach/Bowel: Stomach and duodenum are within normal limits. Small bowel is unremarkable. The terminal ileum is normal. The appendix is visualized and normal. The ascending and transverse colon are within normal limits. The descending and sigmoid colon are normal. Vascular/Lymphatic: Atherosclerotic calcifications are present in the aorta and branch vessels. No aneurysm is present. No significant adenopathy is present. Reproductive: The prostate is enlarged, measuring 6.5 cm in transverse diameter. Seminal vesicles are unremarkable. Other: No abdominal wall hernia or abnormality. No abdominopelvic ascites. Musculoskeletal: The vertebral body heights and alignment are normal. A remote superior endplate L1 fracture is present. Straightening of the normal lumbar lordosis is present. Bony pelvis is within normal  limits. Mild degenerative changes are present at both hips. Review of the MIP images confirms the above findings. IMPRESSION: 1. No pulmonary embolus. 2. Patchy peribronchial airspace opacities in the right upper lobe, anteriorly in the right middle lobe and superior segment of the right lower lobe most likely represents inflammation or early infection. 3. Cardiomegaly without failure. 4. Coronary artery disease. 5. Cholelithiasis without evidence for cholecystitis. 6. Low-density lesion superiorly within the left lobe of the liver is not clearly a simple cyst. Dilated intrahepatic biliary ducts are present anteriorly and to the left of the lesion. Recommend non emergent MRI of the abdomen without and with contrast for further evaluation. 7. Enlarged prostate gland. 8. Remote superior endplate L1 fracture. 9.  Aortic Atherosclerosis (ICD10-I70.0). Electronically Signed   By: Marin Roberts M.D.   On: 05/16/2023 09:38   DG Chest 2 View  Result Date: 05/16/2023 CLINICAL DATA:  Right chest pain. EXAM: CHEST - 2 VIEW COMPARISON:  PA Lat 09/30/2022 FINDINGS: There is mild cardiomegaly with no evidence of CHF. There is patchy aortic calcification with slight aortic tortuosity and stable mediastinum. There is decreased depth of inspiration today with increased linear atelectasis in the left base. No focal pneumonia is evident. No new osseous findings. Several overlying monitor wires. IMPRESSION: Decreased depth of inspiration with increased linear atelectasis in the left base. No evidence of acute chest process. Electronically Signed   By: Almira Bar M.D.   On: 05/16/2023 07:50    Labs:  CBC: Recent Labs    09/30/22 0943 05/16/23 0711 05/19/23 1708 05/20/23 0506  WBC 5.3 15.1* 23.4* 20.9*  HGB 15.4 14.0 14.5 14.0  HCT 47.0 42.7 44.4 41.4  PLT 154 202 230 210    COAGS: Recent Labs    05/16/23 0711 05/20/23 0506  INR 1.3* 1.5*    BMP: Recent Labs    05/16/23 0711 05/19/23 1708  05/19/23 2306 05/20/23 0506  NA 144 139 141 142  K 3.0* 4.1 3.8 3.5  CL 116* 103 105 107  CO2 20* 20* 24 25  GLUCOSE 170* 447* 269* 125*  BUN 19 63* 61* 58*  CALCIUM 6.9* 8.4* 8.3* 8.2*  CREATININE 0.54* 1.57* 1.30* 1.26*  GFRNONAA >60 43* 55* 57*    LIVER FUNCTION TESTS: Recent Labs    09/30/22 0943 05/16/23 0711 05/19/23 1708 05/20/23 0506  BILITOT 1.4* 0.8 1.8* 1.7*  AST 29 20 78* 60*  ALT 16 15 38 34  ALKPHOS 69 90 115 98  PROT 6.8 5.5* 6.7 6.2*  ALBUMIN 3.4* 3.0* 3.0* 2.7*    TUMOR MARKERS: No results for input(s): "AFPTM", "CEA", "CA199", "CHROMGRNA" in the last 8760 hours.  Assessment and Plan:  Mathew Baker is an 82 yo male being seen today in relation to acute calculous cholecystitis. Patient is septic suspected secondary to acute cholecystitis. IR has been consulted to evaluate patient for image-guided percutaneous cholecystostomy. Imaging reviewed and approved by Dr Elby Showers for the above procedure. Case reviewed with Dr Archer Asa this morning and tentatively scheduled to proceed on 05/20/23. INR of 1.5, lactic acid of 1.3 this morning down from >9.0 last night, WBC of 20.9 and Hgb of 14.0. Patient is NPO.  Risks and benefits discussed with the patient including, but not limited to bleeding, infection, gallbladder perforation, bile leak, sepsis or even death.  All of the patient's questions were answered, patient is agreeable to proceed. Consent signed and in chart.   Thank you for this interesting consult.  I greatly enjoyed meeting Mathew Baker and look forward to participating in their care.  A copy of this report was sent to the requesting provider on this date.  Electronically Signed: Kennieth Francois, PA-C 05/20/2023, 7:38 AM   I spent a total of 20 Minutes  in face to face in clinical consultation, greater than 50% of which was counseling/coordinating care for acute cholecystitis.

## 2023-05-20 NOTE — Plan of Care (Signed)
  Problem: Education: Goal: Ability to describe self-care measures that may prevent or decrease complications (Diabetes Survival Skills Education) will improve Outcome: Progressing Goal: Individualized Educational Video(s) Outcome: Progressing   Problem: Coping: Goal: Ability to adjust to condition or change in health will improve Outcome: Progressing   Problem: Fluid Volume: Goal: Ability to maintain a balanced intake and output will improve Outcome: Progressing   Problem: Health Behavior/Discharge Planning: Goal: Ability to identify and utilize available resources and services will improve Outcome: Progressing Goal: Ability to manage health-related needs will improve Outcome: Progressing   Problem: Metabolic: Goal: Ability to maintain appropriate glucose levels will improve Outcome: Progressing   Problem: Nutritional: Goal: Maintenance of adequate nutrition will improve Outcome: Progressing Goal: Progress toward achieving an optimal weight will improve Outcome: Progressing   Problem: Skin Integrity: Goal: Risk for impaired skin integrity will decrease Outcome: Progressing   Problem: Tissue Perfusion: Goal: Adequacy of tissue perfusion will improve Outcome: Progressing   Problem: Education: Goal: Ability to describe self-care measures that may prevent or decrease complications (Diabetes Survival Skills Education) will improve Outcome: Progressing Goal: Individualized Educational Video(s) Outcome: Progressing   Problem: Cardiac: Goal: Ability to maintain an adequate cardiac output will improve Outcome: Progressing   Problem: Health Behavior/Discharge Planning: Goal: Ability to identify and utilize available resources and services will improve Outcome: Progressing Goal: Ability to manage health-related needs will improve Outcome: Progressing   Problem: Fluid Volume: Goal: Ability to achieve a balanced intake and output will improve Outcome: Progressing    Problem: Metabolic: Goal: Ability to maintain appropriate glucose levels will improve Outcome: Progressing   Problem: Nutritional: Goal: Maintenance of adequate nutrition will improve Outcome: Progressing Goal: Maintenance of adequate weight for body size and type will improve Outcome: Progressing   Problem: Respiratory: Goal: Will regain and/or maintain adequate ventilation Outcome: Progressing   Problem: Urinary Elimination: Goal: Ability to achieve and maintain adequate renal perfusion and functioning will improve Outcome: Progressing   Problem: Education: Goal: Knowledge of General Education information will improve Description: Including pain rating scale, medication(s)/side effects and non-pharmacologic comfort measures Outcome: Progressing   Problem: Health Behavior/Discharge Planning: Goal: Ability to manage health-related needs will improve Outcome: Progressing   Problem: Clinical Measurements: Goal: Ability to maintain clinical measurements within normal limits will improve Outcome: Progressing Goal: Will remain free from infection Outcome: Progressing Goal: Diagnostic test results will improve Outcome: Progressing Goal: Respiratory complications will improve Outcome: Progressing Goal: Cardiovascular complication will be avoided Outcome: Progressing   Problem: Activity: Goal: Risk for activity intolerance will decrease Outcome: Progressing   Problem: Nutrition: Goal: Adequate nutrition will be maintained Outcome: Progressing   Problem: Elimination: Goal: Will not experience complications related to bowel motility Outcome: Progressing Goal: Will not experience complications related to urinary retention Outcome: Progressing   Problem: Coping: Goal: Level of anxiety will decrease Outcome: Progressing   Problem: Pain Managment: Goal: General experience of comfort will improve Outcome: Progressing   Problem: Skin Integrity: Goal: Risk for impaired  skin integrity will decrease Outcome: Progressing   Problem: Safety: Goal: Ability to remain free from injury will improve Outcome: Progressing

## 2023-05-20 NOTE — Progress Notes (Signed)
NAMEHyun Baker, MRN:  [id number], DOB:  1941-08-19, LOS: 1 ADMISSION DATE:  05/19/2023, CHIEF COMPLAINT:  Acute Cholecystitis   History of Present Illness:   82 yo M presenting to [facility] ED from home via EMS for evaluation of near syncopal episode.   Patient describes feeling poorly since 05/13/23. He was evaluated in the ED on 9/9 for Right-sided chest pain and mildly productive cough, discharged home with antibiotics for possible pneumonia. Ct imaging was negative for PE but did show cholelithiasis without cholecystitis.   Patient reported continued reproducible upper right-sided chest pain, worse when coughing. He also endorses nausea with a couple of episodes of non-bloody bilious emesis and episodes diarrhea starting 9/12. He also reports abdominal distension with RUQ and bilateral lower quadrant tenderness to palpation. The patient reports poor PO intake, but has managed to take in some Boost drinks. He also endorses polyuria and polydipsia. The patient states he has been taking all medication as prescribed with his last dose of Lasix 9/11 and his last dose of Eliquis 9/12 in the AM. He does not wear O2 at home and reports for the last 6 months he has been experiencing chronic dyspnea with exertion as well as orthopnea and PND, but has not noticed any weight changes or increased swelling. He denies fever/chills or any urinary symptoms.  He fell 1 month ago, losing consciousness. He also reports falling but described it as different, with feeling his legs become weak and go out from under him. He denied hitting his head, he denied blurred vision or and LOC. He normally ambulates with a cane. He denies tobacco use, ETOH use or recreational drug use. He receives most of his care through the [place] [facility]. He was previously in the [employer] for 30 years and served as a Engineer, agricultural.   ED course: Upon arrival patient A&O with stable vitals on 2 L [place] for comfort. Labs suggestive of sepsis with lactic  acidosis & leukocytosis, mild AGMA with hyperglycemia and an AKI, as well as mild Transaminitis. Imaging concerning for acute cholecystitis and atelectasis. Lactic increased significantly after IVF resuscitation and antibiotics to  >9.0. ED discussed case with general surgery (Dr. Everlene Farrier) who recommended percutaneous drain placement. Case discussed with IR on call who will prioritize the patient's case in the AM.  Pertinent  Medical History  HLD HTN T2DM CVA HCC s/p Y90 Varices Chronic PTSD COPD Glaucoma A-fib on Eliquis Carotid artery disease  Significant Hospital Events: Including procedures, antibiotic start and stop dates in addition to other pertinent events   05/19/23: Admit to ICU with Severe Sepsis s/t acute cholecystitis.  05/20/23: hemodynamically stable, lactic acid normalized, surgery and IR consulted  Interim History / Subjective:  Feels better today. Reports a few weeks' history of abdominal pain.  Objective   Blood pressure 121/61, pulse 76, temperature 98.8 F (37.1 C), temperature source Oral, resp. rate 16, height 6\' 1"  (1.854 m), weight 104.8 kg, SpO2 95%.        Intake/Output Summary (Last 24 hours) at 05/20/2023 0758 Last data filed at 05/20/2023 0400 Gross per 24 hour  Intake 125.13 ml  Output 1395 ml  Net -1269.87 ml   Filed Weights   05/19/23 2300 05/20/23 0500  Weight: 104.8 kg 104.8 kg    Examination: Physical Exam Constitutional:      General: He is not in acute distress.    Appearance: He is ill-appearing.  Cardiovascular:     Pulses: Normal pulses.  Heart sounds: Normal heart sounds.  Pulmonary:     Effort: Pulmonary effort is normal.     Breath sounds: Normal breath sounds.  Abdominal:     Palpations: Abdomen is soft.     Tenderness: There is abdominal tenderness.  Neurological:     General: No focal deficit present.     Mental Status: He is alert and oriented to person, place, and time. Mental status is at baseline.       Assessment & Plan:   Neurology #Abdominal Pain  -will initiate IV tylenol every 6 hours standing -hydromorphone 0.5 mg every 4 hours as needed for severe pain  Cardiovascular #Sepsis #Afib #HTN  Hemodynamically stable and has not required vasopressor support. Holding anti-hypertensive agents given sepsis and AKI. His presentation consistent with severe sepsis (SIRS + source of infection) for which he is being treated. We was volume resuscitated with 30 cc/kg of LR and broad spectrum antibiotics were initiated. Holding Apixaban in anticipation for procedural intervention. Afib is currently rate controlled.  -goal MAP > 65 mmHg -TTE ordered  Pulmonary #COPD  History of COPD followed outpatient by Dr. Karna Christmas. DLCO was 88% predicted, FEV1/FVC was 0.57, post bronchodilator 0.7 with 12% improvement following albuterol challenge. Not on inhalers at baseline. Maintaining normal oxygenation.  -incentive spirometry ordered  Gastrointestinal #Acute Cholecystitis #Liver Lesion - HCC  Presenting for a few weeks' history of abdominal pain. Imaging is consistent with acute cholecystitis. Appreciate input from general surgery and IR. Given age and comorbidities, will proceed with percutaneous cholecystostomy tube placement for decompression of the gallbladder. Patient will follow up with general surgery for possible cholecystectomy in 3 months.   Liver lesion noted on imaging is stable. Reviewed record, last seen by IR at [facility] 06/2022. He is s/p Y90 radioembolization of segment 2 lesion.  -Appreciate consults to IR and General Surgery -cholecystostomy tube planned -NPO for now -trend LFT's  Renal #AKI  Baseline kidney function within normal, presents now with an AKI in the setting of sepsis, volume depletion, and ACEi use. Holding nephrotoxic agents and the patient was volume resuscitated on presentation. Will continue with gentle IV fluids while the patient is NPO and continue  to monitor urine output.  -monitor urine output -daily kidney function tests  Endocrine #T2DM #Hyperglycemia  History of T2DM maintained on orals with empagliflozin. Hyperglycemic on presentation with elevated beta-hydroxybutyric acid. Suspect a component of DKA secondary to SGLT-2 inhibitor. This is currently held and the patient is on an insulin sliding scale for glycemic control.  -hold empagliflozin -continue insulin sliding scale  Hem/Onc  On eliquis for atrial fibrillation, holding prior to procedure. Will initiate lovenox while it's held.  ID #Acute Cholecystitis #Severe Sepsis  Leukocytosis and elevated lactic acid on presentation. Found to have acute cholecystitis on imaging. He is on broad spectrum antibiotics with pip/tazo, and blood cultures were sent. Will await biliary fluid cultures and tailor antibiotics accordingly.  -trend procalcitonin -continue broad spectrum antiboitics -follow up biliary and blood cultures  Best Practice (right click and "Reselect all SmartList Selections" daily)   Diet/type: NPO DVT prophylaxis: DOAC GI prophylaxis: N/A Lines: N/A Foley:  N/A Code Status:  full code Last date of multidisciplinary goals of care discussion [05/20/2023]  Labs   CBC: Recent Labs  Lab 05/16/23 0711 05/19/23 1708 05/20/23 0506  WBC 15.1* 23.4* 20.9*  NEUTROABS  --  20.4*  --   HGB 14.0 14.5 14.0  HCT 42.7 44.4 41.4  MCV 101.2* 101.4* 99.5  PLT 202  230 210    Basic Metabolic Panel: Recent Labs  Lab 05/16/23 0711 05/19/23 1708 05/19/23 2306 05/20/23 0506  NA 144 139 141 142  K 3.0* 4.1 3.8 3.5  CL 116* 103 105 107  CO2 20* 20* 24 25  GLUCOSE 170* 447* 269* 125*  BUN 19 63* 61* 58*  CREATININE 0.54* 1.57* 1.30* 1.26*  CALCIUM 6.9* 8.4* 8.3* 8.2*  MG  --   --   --  3.0*  PHOS  --   --   --  2.8   GFR: Estimated Creatinine Clearance: 56.5 mL/min (A) (by C-G formula based on SCr of 1.26 mg/dL (H)). Recent Labs  Lab 05/16/23 0711  05/19/23 1708 05/19/23 1928 05/19/23 2306 05/20/23 0123 05/20/23 0506  PROCALCITON <0.10  --   --  7.36  --  6.52  WBC 15.1* 23.4*  --   --   --  20.9*  LATICACIDVEN  --  5.9* >9.0* 1.7 1.3  --     Liver Function Tests: Recent Labs  Lab 05/16/23 0711 05/19/23 1708 05/20/23 0506  AST 20 78* 60*  ALT 15 38 34  ALKPHOS 90 115 98  BILITOT 0.8 1.8* 1.7*  PROT 5.5* 6.7 6.2*  ALBUMIN 3.0* 3.0* 2.7*   Recent Labs  Lab 05/16/23 0711 05/19/23 1708  LIPASE 32 58*   No results for input(s): "AMMONIA" in the last 168 hours.  ABG    Component Value Date/Time   HCO3 27.9 05/19/2023 2306   ACIDBASEDEF 7.0 (H) 05/19/2023 1928   O2SAT 72.3 05/19/2023 2306     Coagulation Profile: Recent Labs  Lab 05/16/23 0711 05/20/23 0506  INR 1.3* 1.5*    Cardiac Enzymes: No results for input(s): "CKTOTAL", "CKMB", "CKMBINDEX", "TROPONINI" in the last 168 hours.  HbA1C: Hgb A1c MFr Bld  Date/Time Value Ref Range Status  09/10/2020 04:31 AM 8.0 (H) 4.8 - 5.6 % Final    Comment:    (NOTE) Pre diabetes:          5.7%-6.4%  Diabetes:              >6.4%  Glycemic control for   <7.0% adults with diabetes   01/09/2020 10:04 AM 9.4 (H) 4.8 - 5.6 % Final    Comment:    (NOTE) Pre diabetes:          5.7%-6.4% Diabetes:              >6.4% Glycemic control for   <7.0% adults with diabetes     CBG: Recent Labs  Lab 05/19/23 2235 05/20/23 0011 05/20/23 0333  GLUCAP 264* 252* 145*    Past Medical History:  He,  has a past medical history of Chronic airway obstruction (HCC), Degeneration of intervertebral disc of lumbar region, Diabetes mellitus without complication (HCC), Essential hypertension, benign, Hyperlipidemia, Hypertension, Osteoarthritis of lower extremity, Pneumonia, organism unspecified(486), PSA elevation (07/16/2013), and Stroke (HCC).   Surgical History:   Past Surgical History:  Procedure Laterality Date   CATARACT EXTRACTION W/ INTRAOCULAR LENS  IMPLANT,  BILATERAL     COLONOSCOPY  2006   Normal: Repeat in 10 yrs   LOOP RECORDER INSERTION N/A 02/21/2020   Procedure: LOOP RECORDER INSERTION;  Surgeon: Marcina Millard, MD;  Location: [facility] INVASIVE CV LAB;  Service: Cardiovascular;  Laterality: N/A;   LOOP RECORDER REMOVAL N/A 10/23/2020   Procedure: LOOP RECORDER REMOVAL;  Surgeon: Marcina Millard, MD;  Location: [facility] INVASIVE CV LAB;  Service: Cardiovascular;  Laterality: N/A;   TEE WITHOUT CARDIOVERSION  N/A 09/15/2020   Procedure: TRANSESOPHAGEAL ECHOCARDIOGRAM (TEE);  Surgeon: Dalia Heading, MD;  Location: [facility] ORS;  Service: Cardiovascular;  Laterality: N/A;     Social History:   reports that he quit smoking about 64 years ago. His smoking use included cigarettes. He has never used smokeless tobacco. He reports that he does not drink alcohol and does not use drugs.   Family History:  His family history includes Diabetes in his mother.   Allergies No Known Allergies   Home Medications  Prior to Admission medications   Medication Sig Start Date End Date Taking? Authorizing Provider  albuterol (VENTOLIN HFA) 108 (90 Base) MCG/ACT inhaler Inhale 2 puffs into the lungs every 6 (six) hours as needed for wheezing or shortness of breath. 04/28/20  Yes [provider]  Alcohol Swabs (ALCOHOL WIPES) 70 % PADS USE FOR INSULIN INJECTIONS OR TO FINGER STICK AS DIRECTED BY YOUR MEDICAL PROVIDER 04/28/20  Yes [provider]  amoxicillin-clavulanate (AUGMENTIN) 875-125 MG tablet Take 1 tablet by mouth 2 (two) times daily for 7 days. 05/16/23 05/23/23 Yes Sharyn Creamer, MD  atorvastatin (LIPITOR) 80 MG tablet Take 80 mg by mouth daily.   Yes [provider]  azithromycin (ZITHROMAX) 250 MG tablet Take 2 tablets PO on day 1, then take 1 tablet PO daily for 4 more days 05/16/23  Yes Sharyn Creamer, MD  benzonatate (TESSALON PERLES) 100 MG capsule Take 1 capsule (100 mg total) by mouth 2 (two) times daily as needed for cough. 05/16/23   Yes Sharyn Creamer, MD  docusate sodium (COLACE) 100 MG capsule Take 100 mg by mouth 2 (two) times daily. 05/02/23  Yes [provider]  Dulaglutide 4.5 MG/0.5ML SOPN Inject 4.5 mg into the skin every Monday.   Yes [provider]  ELIQUIS 5 MG TABS tablet Take 5 mg by mouth 2 (two) times daily. 08/18/20  Yes [provider]  empagliflozin (JARDIANCE) 25 MG TABS tablet 1 tablet 1 day or 1 dose. 12/09/20  Yes [provider]  glucose blood (PRECISION QID TEST) test strip Use to test blood sugar 2 times daily as instructed. Dx: E11.65 08/22/15  Yes [provider]  HYDROcodone-acetaminophen (NORCO/VICODIN) 5-325 MG tablet Take 1 tablet by mouth every 8 (eight) hours as needed. 02/26/21  Yes [provider]  insulin glargine (LANTUS) 100 UNIT/ML injection Inject 70 Units into the skin 2 (two) times daily.    Yes [provider]  Insulin Pen Needle (PEN NEEDLES 29GX1/2") 29G X MISC Use 1x a day 08/21/14  Yes [provider]  ipratropium (ATROVENT HFA) 17 MCG/ACT inhaler Inhale 2 puffs into the lungs every 6 (six) hours. 09/16/20  Yes Drema Dallas, MD  latanoprost (XALATAN) 0.005 % ophthalmic solution Place 1 drop into both eyes at bedtime.   Yes [provider]  lisinopril (ZESTRIL) 10 MG tablet Take 1 tablet by mouth daily. 12/08/20  Yes [provider]  lisinopril (ZESTRIL) 5 MG tablet Take 5 mg by mouth daily. 03/03/23  Yes [provider]  Magnesium Oxide 420 MG TABS Take 1 tablet by mouth daily. 10/31/20  Yes [provider]  tamsulosin (FLOMAX) 0.4 MG CAPS capsule Take 0.4 mg by mouth at bedtime.   Yes [provider]  vitamin B-12 (CYANOCOBALAMIN) 100 MCG tablet Take 100 mcg by mouth daily.   Yes [provider]  ascorbic acid (VITAMIN C) 500 MG tablet Take 1 tablet (500 mg total) by mouth daily. 09/17/20  Drema Dallas, MD  aspirin EC 81 MG tablet Take 81 mg by mouth at  bedtime. Swallow whole.    [provider]  furosemide (LASIX) 20 MG tablet Take 20 mg by mouth daily. 10/13/20   [provider]  insulin aspart (NOVOLOG) 100 UNIT/ML injection Inject 0-9 Units into the skin 3 (three) times daily with meals. Patient taking differently: Inject 0-6 Units into the skin 3 (three) times daily as needed for high blood sugar. 03/25/19   Katha Hamming, MD  sildenafil (VIAGRA) 100 MG tablet Take 1 tablet by mouth as needed. 12/08/20   [provider]     Critical care time: 60 minutes    Raechel Chute, MD [facility] 05/20/2023 11:48 AM

## 2023-05-20 NOTE — Procedures (Signed)
Interventional Radiology Procedure Note  Procedure: Percutaneous transhepatic cholecystostomy tube placement.   Complications: None  Estimated Blood Loss: None  Recommendations: - Tube to gravity - Bile aspirate sent for culture   Signed,  Sterling Big, MD

## 2023-05-20 NOTE — Plan of Care (Signed)
  Problem: Education: Goal: Ability to describe self-care measures that may prevent or decrease complications (Diabetes Survival Skills Education) will improve Outcome: Progressing   Problem: Fluid Volume: Goal: Ability to maintain a balanced intake and output will improve Outcome: Progressing   Problem: Metabolic: Goal: Ability to maintain appropriate glucose levels will improve Outcome: Progressing   Problem: Skin Integrity: Goal: Risk for impaired skin integrity will decrease Outcome: Progressing   Problem: Fluid Volume: Goal: Ability to achieve a balanced intake and output will improve Outcome: Progressing   Problem: Metabolic: Goal: Ability to maintain appropriate glucose levels will improve Outcome: Progressing   Problem: Safety: Goal: Ability to remain free from injury will improve Outcome: Progressing

## 2023-05-21 ENCOUNTER — Inpatient Hospital Stay
Admit: 2023-05-21 | Discharge: 2023-05-21 | Disposition: A | Discharge status: Home / Self Care | Payer: Medicare Other | Attending: Pulmonary Disease

## 2023-05-21 DIAGNOSIS — J189 Pneumonia, unspecified organism: Secondary | ICD-10-CM | POA: Diagnosis not present

## 2023-05-21 DIAGNOSIS — E876 Hypokalemia: Secondary | ICD-10-CM

## 2023-05-21 DIAGNOSIS — N179 Acute kidney failure, unspecified: Secondary | ICD-10-CM | POA: Diagnosis not present

## 2023-05-21 DIAGNOSIS — E669 Obesity, unspecified: Secondary | ICD-10-CM

## 2023-05-21 DIAGNOSIS — J9601 Acute respiratory failure with hypoxia: Secondary | ICD-10-CM | POA: Diagnosis not present

## 2023-05-21 DIAGNOSIS — Z794 Long term (current) use of insulin: Secondary | ICD-10-CM

## 2023-05-21 DIAGNOSIS — E1165 Type 2 diabetes mellitus with hyperglycemia: Secondary | ICD-10-CM

## 2023-05-21 DIAGNOSIS — J181 Lobar pneumonia, unspecified organism: Secondary | ICD-10-CM | POA: Insufficient documentation

## 2023-05-21 DIAGNOSIS — K810 Acute cholecystitis: Secondary | ICD-10-CM | POA: Diagnosis not present

## 2023-05-21 LAB — MAGNESIUM: Magnesium: 2.6 mg/dL — ABNORMAL HIGH (ref 1.7–2.4)

## 2023-05-21 LAB — CBC
HCT: 39.5 % (ref 39.0–52.0)
Hemoglobin: 13.1 g/dL (ref 13.0–17.0)
MCH: 33.1 pg (ref 26.0–34.0)
MCHC: 33.2 g/dL (ref 30.0–36.0)
MCV: 99.7 fL (ref 80.0–100.0)
Platelets: 215 10*3/uL (ref 150–400)
RBC: 3.96 MIL/uL — ABNORMAL LOW (ref 4.22–5.81)
RDW: 13 % (ref 11.5–15.5)
WBC: 18 10*3/uL — ABNORMAL HIGH (ref 4.0–10.5)
nRBC: 0 % (ref 0.0–0.2)

## 2023-05-21 LAB — BASIC METABOLIC PANEL
Anion gap: 10 (ref 5–15)
BUN: 45 mg/dL — ABNORMAL HIGH (ref 8–23)
CO2: 24 mmol/L (ref 22–32)
Calcium: 7.6 mg/dL — ABNORMAL LOW (ref 8.9–10.3)
Chloride: 103 mmol/L (ref 98–111)
Creatinine, Ser: 1.09 mg/dL (ref 0.61–1.24)
GFR, Estimated: >60 mL/min (ref >60)
Glucose, Bld: 179 mg/dL — ABNORMAL HIGH (ref 70–99)
Potassium: 3.4 mmol/L — ABNORMAL LOW (ref 3.5–5.1)
Sodium: 137 mmol/L (ref 135–145)

## 2023-05-21 LAB — GLUCOSE, CAPILLARY
Glucose-Capillary: 187 mg/dL — ABNORMAL HIGH (ref 70–99)
Glucose-Capillary: 188 mg/dL — ABNORMAL HIGH (ref 70–99)
Glucose-Capillary: 128 mg/dL — ABNORMAL HIGH (ref 70–99)
Glucose-Capillary: 198 mg/dL — ABNORMAL HIGH (ref 70–99)
Glucose-Capillary: 322 mg/dL — ABNORMAL HIGH (ref 70–99)
Glucose-Capillary: 287 mg/dL — ABNORMAL HIGH (ref 70–99)

## 2023-05-21 LAB — HEMOGLOBIN A1C
Hgb A1c MFr Bld: 8.7 % — ABNORMAL HIGH (ref 4.8–5.6)
Mean Plasma Glucose: 203 mg/dL

## 2023-05-21 LAB — PHOSPHORUS: Phosphorus: 2.9 mg/dL (ref 2.5–4.6)

## 2023-05-21 LAB — PROCALCITONIN: Procalcitonin: 5.77 ng/mL

## 2023-05-21 MED ORDER — FUROSEMIDE 10 MG/ML IJ SOLN
40.0000 mg | Freq: Once | INTRAMUSCULAR | Status: AC
  Administered 2023-05-21: 40 mg via INTRAVENOUS
  Filled 2023-05-21: qty 4

## 2023-05-21 MED ORDER — PERFLUTREN LIPID MICROSPHERE
1.0000 mL | INTRAVENOUS | Status: AC | PRN
  Administered 2023-05-21: 3 mL via INTRAVENOUS

## 2023-05-21 MED ORDER — SODIUM CHLORIDE 0.9 % IV SOLN: INTRAVENOUS | Status: DC | PRN

## 2023-05-21 MED ORDER — FUROSEMIDE 40 MG PO TABS: 40.0000 mg | ORAL_TABLET | Freq: Every day | ORAL | Status: DC

## 2023-05-21 MED ORDER — POTASSIUM CHLORIDE CRYS ER 20 MEQ PO TBCR
40.0000 meq | EXTENDED_RELEASE_TABLET | Freq: Once | ORAL | Status: AC
  Administered 2023-05-21: 40 meq via ORAL
  Filled 2023-05-21: qty 2

## 2023-05-21 MED ORDER — INSULIN GLARGINE-YFGN 100 UNIT/ML ~~LOC~~ SOLN
10.0000 [IU] | Freq: Every day | SUBCUTANEOUS | Status: DC
  Administered 2023-05-21 – 2023-05-22 (×2): 10 [IU] via SUBCUTANEOUS
  Filled 2023-05-21 (×3): qty 0.1

## 2023-05-21 NOTE — Progress Notes (Signed)
Progress Note   PatientKharon Baker ZOX:[id number] DOB: 1940/11/15 DOA: 05/19/2023     2 DOS: the patient was seen and examined on 05/21/2023   Brief hospital course: 82 year old man with history of COPD, hypertension, type 2 diabetes mellitus, hyperlipidemia, stroke presents with syncopal episode and found to have acute cholecystitis on CT scan.  Percutaneous cholecystectomy tube placed by interventional radiology on 9/13.  Patient empirically on Zosyn.  9/14.  Patient on 5 L of oxygen this morning.  Has some pain in his right upper quadrant.  Some cough.  Assessment and Plan: * Cholecystitis, acute Percutaneous cholecystectomy tube placed on 9/13 by interventional radiology.  On empiric Zosyn.  Did not meet the SIRS criteria for sepsis even though lactic acid was high.  Only had leukocytosis.  Acute hypoxic respiratory failure (HCC) This morning patient on 5 L of oxygen.  Discontinued IV fluids.  Incentive spirometer.  Right upper lobe pneumonia On Zosyn.  Hypokalemia Replace orally  Obesity (BMI 30-39.9) BMI 30.60  AKI (acute kidney injury) (HCC) Creatinine 1.57 on presentation down to 1.09.  Uncontrolled type 2 diabetes mellitus with hyperglycemia, with long-term current use of insulin (HCC) Patient on sliding scale insulin.  Will add low-dose Lantus.        Subjective: Patient feeling a little bit better still has some right upper quadrant pain still has not coughing.  On 5 L of oxygen this morning.  Physical Exam: Vitals:   05/21/23 0407 05/21/23 0612 05/21/23 0729 05/21/23 1238  BP: 133/65  (!) 141/78 125/60  Pulse: 61  69 76  Resp:   20 18  Temp: 98.1 F (36.7 C)  97.9 F (36.6 C) 98.3 F (36.8 C)  TempSrc: Oral     SpO2: 98%  98% 98%  Weight:  105.2 kg    Height:       Physical Exam HENT:     Head: Normocephalic.     Mouth/Throat:     Pharynx: No oropharyngeal exudate.  Eyes:     General: Lids are normal.     Conjunctiva/sclera: Conjunctivae  normal.  Cardiovascular:     Rate and Rhythm: Normal rate and regular rhythm.     Heart sounds: Normal heart sounds, S1 normal and S2 normal.  Pulmonary:     Breath sounds: Examination of the right-lower field reveals decreased breath sounds. Examination of the left-lower field reveals decreased breath sounds. Decreased breath sounds present. No wheezing, rhonchi or rales.  Abdominal:     Palpations: Abdomen is soft.     Tenderness: There is no abdominal tenderness.  Musculoskeletal:     Right lower leg: Swelling present.     Left lower leg: Swelling present.  Skin:    General: Skin is warm.     Findings: No rash.  Neurological:     Mental Status: He is alert and oriented to person, place, and time.     Data Reviewed: Procalcitonin 5.77, white blood cell count 18.0, hemoglobin 13.1, creatinine 1.09  Family Communication: Updated patient's daughter on the phone  Disposition: Status is: Inpatient Remains inpatient appropriate because: Continue percutaneous tube drainage and IV antibiotics.  Would like to taper off oxygen.  Planned Discharge Destination: Home with Home Health    Time spent: 28 minutes  Author: Alford Highland, MD 05/21/2023 1:56 PM  For on call review [email or web address].

## 2023-05-21 NOTE — Evaluation (Signed)
Physical Therapy Evaluation Patient Details Name: Mathew Baker MRN: [id number] DOB: 02-19-1941 Today's Date: 05/21/2023  History of Present Illness  82 y/o male with h/o DM, HTN, HLD, Afib, CAD, IDA and COPD who is admitted with sepsis, AKI, acute cholecystitis and DKA.   Clinical Impression  Pt admitted with above diagnosis. Pt currently with functional limitations due to the deficits listed below (see PT Problem List). Pt received sleeping in bed easily awakened to voice. Agreeable to PT eval. Reports PTA pt is mod-I with household and community distances using his RW and indep with ADL's/IADL's and still driving.   To date pt did not require any physical assistance, grossly CGA for bed mobility, STS transfers, and gait using RW. Pt with mildly poor safety awareness requiring VC's for safe hand placement for transfers and maintaining body inside RW BOS throughout with fair to good carryover. Pt ambulating ~40' in room total with urinary attempts made without success in bathroom. Pt returned to recliner with all needs in reach with SPO2 and HR normal and appropriate readings at rest and with mobility. Pt will benefit from additional PT services to ensure reduced falls risk and improved LE strength to maximize return to PLOF.     If plan is discharge home, recommend the following: A little help with walking and/or transfers;A little help with bathing/dressing/bathroom;Assistance with cooking/housework;Help with stairs or ramp for entrance   Can travel by private vehicle        Equipment Recommendations None recommended by PT  Recommendations for Other Services       Functional Status Assessment Patient has had a recent decline in their functional status and demonstrates the ability to make significant improvements in function in a reasonable and predictable amount of time.     Precautions / Restrictions Precautions Precautions: Fall Restrictions Weight Bearing Restrictions: No       Mobility  Bed Mobility Overal bed mobility: Needs Assistance Bed Mobility: Supine to Sit     Supine to sit: Supervision, HOB elevated     General bed mobility comments: increased time Patient Response: Cooperative  Transfers Overall transfer level: Needs assistance Equipment used: Rolling walker (2 wheels) Transfers: Sit to/from Stand Sit to Stand: Contact guard assist           General transfer comment: VC's for hand placement    Ambulation/Gait Ambulation/Gait assistance: Supervision Gait Distance (Feet): 40 Feet Assistive device: Rolling walker (2 wheels) Gait Pattern/deviations: Step-to pattern, Decreased step length - right, Decreased step length - left       General Gait Details: generally slowed gait cadence with heavy BUE support on RW. Completes room distances safely with no notable imbalance  Stairs            Wheelchair Mobility     Tilt Bed Tilt Bed Patient Response: Cooperative  Modified Rankin (Stroke Patients Only)       Balance Overall balance assessment: Needs assistance Sitting-balance support: No upper extremity supported, Feet supported Sitting balance-Leahy Scale: Fair     Standing balance support: Bilateral upper extremity supported, During functional activity, Single extremity supported Standing balance-Leahy Scale: Fair Standing balance comment: SUE support on RW                             Pertinent Vitals/Pain Pain Assessment Pain Assessment: 0-10 Pain Score: 8  Pain Location: RUQ Pain Descriptors / Indicators: Aching, Discomfort Pain Intervention(s): Monitored during session, Repositioned    Home Living  Family/patient expects to be discharged to:: Private residence Living Arrangements: Spouse/significant other Available Help at Discharge: Family;Available 24 hours/day Type of Home: House Home Access: Level entry       Home Layout: One level Home Equipment: Agricultural consultant (2 wheels)       Prior Function Prior Level of Function : Independent/Modified Independent             Mobility Comments: RW at baseline ADLs Comments: still drives     Extremity/Trunk Assessment   Upper Extremity Assessment Upper Extremity Assessment: Defer to OT evaluation    Lower Extremity Assessment Lower Extremity Assessment: Generalized weakness    Cervical / Trunk Assessment Cervical / Trunk Assessment: Normal  Communication   Communication Communication: No apparent difficulties Cueing Techniques: Verbal cues;Gestural cues;Tactile cues  Cognition Arousal: Alert Behavior During Therapy: WFL for tasks assessed/performed Overall Cognitive Status: Within Functional Limits for tasks assessed                                 General Comments: HoH        General Comments General comments (skin integrity, edema, etc.): SPO2 and HR normal on 5L/min via [place]    Exercises Other Exercises Other Exercises: Role of PT in acute setting, d/c recs, safe use of AD   Assessment/Plan    PT Assessment Patient needs continued PT services  PT Problem List Decreased strength;Decreased activity tolerance;Decreased balance;Decreased mobility       PT Treatment Interventions DME instruction;Balance training;Gait training;Neuromuscular re-education;Functional mobility training;Patient/family education;Therapeutic activities;Therapeutic exercise    PT Goals (Current goals can be found in the Care Plan section)  Acute Rehab PT Goals Patient Stated Goal: to return home PT Goal Formulation: With patient Time For Goal Achievement: 06/04/23 Potential to Achieve Goals: Good    Frequency Min 1X/week     Co-evaluation               AM-PAC PT "6 Clicks" Mobility  Outcome Measure Help needed turning from your back to your side while in a flat bed without using bedrails?: A Little Help needed moving from lying on your back to sitting on the side of a flat bed without using  bedrails?: A Little Help needed moving to and from a bed to a chair (including a wheelchair)?: A Little Help needed standing up from a chair using your arms (e.g., wheelchair or bedside chair)?: A Little Help needed to walk in hospital room?: A Little Help needed climbing 3-5 steps with a railing? : A Lot 6 Click Score: 17    End of Session Equipment Utilized During Treatment: Gait belt;Oxygen Activity Tolerance: Patient tolerated treatment well Patient left: with call bell/phone within reach;in chair;with chair alarm set Nurse Communication: Mobility status PT Visit Diagnosis: Muscle weakness (generalized) (M62.81);Other abnormalities of gait and mobility (R26.89)    Time: 4132-4401 PT Time Calculation (min) (ACUTE ONLY): 31 min   Charges:   PT Evaluation $PT Eval Low Complexity: 1 Low PT Treatments $Therapeutic Activity: 8-22 mins PT General Charges $$ ACUTE PT VISIT: 1 Visit         Delphia Grates. Fairly IV, PT, DPT Physical Therapist- [place]  [facility]  05/21/2023, 12:51 PM

## 2023-05-21 NOTE — Plan of Care (Signed)

## 2023-05-21 NOTE — Assessment & Plan Note (Signed)
Patient on sliding scale insulin.  Will add low-dose Lantus.

## 2023-05-21 NOTE — Assessment & Plan Note (Signed)
Yesterday had a pulse ox of 86% on room air.  This morning patient on 2 L of oxygen.  Incentive spirometer.  Continue oral Lasix.  Check to see if we can get off oxygen on a daily basis.

## 2023-05-21 NOTE — Hospital Course (Signed)
82 year old man with history of COPD, hypertension, type 2 diabetes mellitus, hyperlipidemia, stroke presents with syncopal episode and found to have acute cholecystitis on CT scan.  Percutaneous cholecystectomy tube placed by interventional radiology on 9/13.  Patient empirically on Zosyn.  9/14.  Patient on 5 L of oxygen this morning.  Has some pain in his right upper quadrant.  Some cough. 9/15.  Patient on 2 L of oxygen this morning.  Still having some right upper quadrant abdominal pain. 9/16.  Patient off oxygen this morning.  Still having right upper quadrant pain. 9/17.  Patient feeling a little bit better.  Still has some right upper quadrant pain interested in going home today.

## 2023-05-21 NOTE — Progress Notes (Signed)
Patient ID: Mathew Baker, male   DOB: April 16, 1941, 82 y.o.   MRN: [id number]     SURGICAL PROGRESS NOTE   Hospital Day(s): 2.   Interval History: Patient seen and examined, no acute events or new complaints overnight. Patient reports feeling well.  He denies any abdominal pain.  He denies any nausea or vomiting.  He endorses tolerating diet.  Vital signs in last 24 hours: [min-max] current  Temp:  [98.1 F (36.7 C)-100 F (37.8 C)] 98.1 F (36.7 C) (09/14 0407) Pulse Rate:  [61-81] 61 (09/14 0407) Resp:  [9-26] 16 (09/13 2349) BP: (98-152)/(59-80) 133/65 (09/14 0407) SpO2:  [91 %-99 %] 98 % (09/14 0407) Weight:  [104.8 kg-105.2 kg] 105.2 kg (09/14 0612)     Height: 6\' 1"  (185.4 cm) Weight: 105.2 kg BMI (Calculated): 30.61   Physical Exam:  Constitutional: alert, cooperative and no distress  Respiratory: breathing non-labored at rest  Cardiovascular: regular rate and sinus rhythm  Gastrointestinal: soft, non-tender, and non-distended  Labs:     Latest Ref Rng & Units 05/21/2023    4:43 AM 05/20/2023    5:06 AM 05/19/2023    5:08 PM  CBC  WBC 4.0 - 10.5 K/uL 18.0  20.9  23.4   Hemoglobin 13.0 - 17.0 g/dL 78.2  95.6  21.3   Hematocrit 39.0 - 52.0 % 39.5  41.4  44.4   Platelets 150 - 400 K/uL 215  210  230       Latest Ref Rng & Units 05/21/2023    4:43 AM 05/20/2023    5:06 AM 05/19/2023   11:06 PM  CMP  Glucose 70 - 99 mg/dL 086  578  469   BUN 8 - 23 mg/dL 45  58  61   Creatinine 0.61 - 1.24 mg/dL 6.29  5.28  4.13   Sodium 135 - 145 mmol/L 137  142  141   Potassium 3.5 - 5.1 mmol/L 3.4  3.5  3.8   Chloride 98 - 111 mmol/L 103  107  105   CO2 22 - 32 mmol/L 24  25  24    Calcium 8.9 - 10.3 mg/dL 7.6  8.2  8.3   Total Protein 6.5 - 8.1 g/dL  6.2    Total Bilirubin 0.3 - 1.2 mg/dL  1.7    Alkaline Phos 38 - 126 U/L  98    AST 15 - 41 U/L  60    ALT 0 - 44 U/L  34      Imaging studies: I personally evaluated the images of the percutaneous cholecystostomy  procedure.   Assessment/Plan:  82 y.o. male with acute cholecystitis   s/p percutaneous cholecystostomy, complicated by pertinent comorbidities including COPD, hypertension, history of stroke, A-fib and on anticoagulation.  -Patient and initially presented with severe sepsis due to acute cholecystitis.  Due to medical comorbidities decision was to treat with antibiotic therapy and percutaneous cholecystostomy -Percutaneous cholecystostomy placed yesterday.  Patient doing well. -Patient tolerating diet -White blood cell count decreasing adequately -From surgical standpoint, cholecystitis is adequately treated.  No contraindication to discharge once medically stable. -Continue antibiotic therapy, may transition to oral to complete 10 days. -Patient will be follow by Dr. Everlene Farrier for management of cholecystostomy tube and further discussion of cholecystectomy in the future  Gae Gallop, MD

## 2023-05-21 NOTE — Assessment & Plan Note (Signed)
Replaced.

## 2023-05-21 NOTE — Assessment & Plan Note (Deleted)
Present on admission with acute cholecystitis, acute kidney injury, lactic acidosis, hypotension, leukocytosis, fever on 9/13.  Continue Zosyn.

## 2023-05-21 NOTE — Assessment & Plan Note (Signed)
On Zosyn.

## 2023-05-21 NOTE — Assessment & Plan Note (Addendum)
Percutaneous cholecystectomy tube placed on 9/13 by interventional radiology.  On empiric Zosyn.  Did not meet the SIRS criteria for sepsis even though lactic acid was high.  Only had leukocytosis.

## 2023-05-21 NOTE — Assessment & Plan Note (Signed)
Creatinine 1.57 on presentation down to 1.15.

## 2023-05-21 NOTE — Progress Notes (Signed)
  SATURATION QUALIFICATIONS: (This note is used to comply with regulatory documentation for home oxygen)  Patient Saturations on Room Air at Rest = 90%  Patient Saturations on Room Air while Ambulating = 86%  Patient Saturations on 2 Liters of oxygen while Ambulating = 94%  Please briefly explain why patient needs home oxygen: Patient required supplemental oxygen while ambulating.

## 2023-05-21 NOTE — Evaluation (Signed)
Occupational Therapy Evaluation Patient Details Name: Mathew Baker MRN: [id number] DOB: Feb 19, 1941 Today's Date: 05/21/2023   History of Present Illness 82 y/o male with h/o DM, HTN, HLD, Afib, CAD, IDA and COPD who is admitted with sepsis, AKI, acute cholecystitis and DKA   Clinical Impression   Mathew Baker was seen for OT evaluation this date. Prior to hospital admission, pt was generally independent for ADL management and reports using a RW for functional mobility. Pt lives with his spouse in a 1 level home with a level entry. Pt presents to acute OT demonstrating impaired ADL performance and functional mobility 2/2 generalized weakness, limited cardiopulmonary status, and decreased activity tolerance (See OT problem list for additional functional deficits). Pt currently requires SUPERVISION- MIN A for functional transfers, SUPERVISION for LB dressing and toileting.  Pt would benefit from skilled OT services to address noted impairments and functional limitations (see below for any additional details) in order to maximize safety and independence while minimizing falls risk and caregiver burden. Anticipate the need for follow up OT services in the home setting upon acute hospital DC.        If plan is discharge home, recommend the following: A little help with walking and/or transfers;A little help with bathing/dressing/bathroom;Help with stairs or ramp for entrance;Assist for transportation;Assistance with cooking/housework    Functional Status Assessment  Patient has had a recent decline in their functional status and demonstrates the ability to make significant improvements in function in a reasonable and predictable amount of time.  Equipment Recommendations  None recommended by OT    Recommendations for Other Services       Precautions / Restrictions Precautions Precautions: Fall Restrictions Weight Bearing Restrictions: No      Mobility Bed Mobility Overal bed mobility:  Needs Assistance             General bed mobility comments: NT pt in recliner at start/end of session.    Transfers Overall transfer level: Needs assistance Equipment used: Rolling walker (2 wheels) Transfers: Sit to/from Stand Sit to Stand: Supervision, Min assist           General transfer comment: MIN A from low commode and recliner at end of session.      Balance Overall balance assessment: Needs assistance Sitting-balance support: No upper extremity supported, Feet supported Sitting balance-Leahy Scale: Good Sitting balance - Comments: no LOB appreciated with weight shift during functional activity.   Standing balance support: Bilateral upper extremity supported, During functional activity, Single extremity supported, Reliant on assistive device for balance Standing balance-Leahy Scale: Fair Standing balance comment: SUE support on RW                           ADL either performed or assessed with clinical judgement   ADL Overall ADL's : Needs assistance/impaired                                       General ADL Comments: SUPERVISION for safety with toilet transfer/toileting. Pt incontinent of urine on way to commode requires assistance to safely lower onto commode 2/2 wet floor. Able to doff/don socks using figure 4 position with increased time/effort to perform while seated in recliner but no physical assist needed. Intermittent MIN A for functional mobility as pt fatigues.     Vision Patient Visual Report: No change from baseline  Perception         Praxis         Pertinent Vitals/Pain Pain Assessment Pain Assessment: 0-10 Pain Score: 7  Pain Location: RUQ Pain Descriptors / Indicators: Aching, Discomfort Pain Intervention(s): Limited activity within patient's tolerance, Monitored during session     Extremity/Trunk Assessment Upper Extremity Assessment Upper Extremity Assessment: Generalized weakness   Lower  Extremity Assessment Lower Extremity Assessment: Generalized weakness   Cervical / Trunk Assessment Cervical / Trunk Assessment: Normal   Communication Communication Communication: Difficulty following commands/understanding Following commands: Follows one step commands with increased time Cueing Techniques: Verbal cues;Gestural cues   Cognition Arousal: Alert Behavior During Therapy: WFL for tasks assessed/performed Overall Cognitive Status: Within Functional Limits for tasks assessed                                 General Comments: No family present to determine baseline, mild impulsivity with mobility, requires consistent multimodal cueing t/o session.     General Comments  SpO2 monitored t/o session. RN in room to assess O2 sats with mobility on/off supplemental O2. Pt noted to desat from 95% to 86% while on RA with brief ambulation trial of ~5 feet. RN aware. Pt placed back on 2L [place] O2 sats WFL at end of session.    Exercises Other Exercises Other Exercises: Pt educated on falls prevention strategies, safe use of AE/DME for ADL management, energy conservation techniques and DC recs.   Shoulder Instructions      Home Living Family/patient expects to be discharged to:: Private residence Living Arrangements: Spouse/significant other Available Help at Discharge: Family;Available 24 hours/day Type of Home: House Home Access: Level entry     Home Layout: One level     Bathroom Shower/Tub: Producer, television/film/video: Standard     Home Equipment: Agricultural consultant (2 wheels);Shower seat;Adaptive equipment Adaptive Equipment: Reacher        Prior Functioning/Environment Prior Level of Function : Independent/Modified Independent;Driving             Mobility Comments: RW at baseline, denies falls history in last 6 months ADLs Comments: Independent/MOD I with ADL management, driving.        OT Problem List: Decreased strength;Decreased  coordination;Decreased activity tolerance;Decreased safety awareness;Impaired balance (sitting and/or standing);Decreased knowledge of use of DME or AE;Cardiopulmonary status limiting activity      OT Treatment/Interventions: Self-care/ADL training;Therapeutic exercise;Therapeutic activities;DME and/or AE instruction;Patient/family education;Energy conservation;Balance training    OT Goals(Current goals can be found in the care plan section) Acute Rehab OT Goals Patient Stated Goal: To feel better OT Goal Formulation: With patient Time For Goal Achievement: 06/04/23 Potential to Achieve Goals: Good ADL Goals Pt Will Perform Grooming: standing;with modified independence Pt Will Perform Lower Body Dressing: sit to/from stand;with modified independence;with adaptive equipment Pt Will Transfer to Toilet: regular height toilet;with modified independence;ambulating Pt Will Perform Toileting - Clothing Manipulation and hygiene: sit to/from stand;with adaptive equipment;with modified independence  OT Frequency: Min 1X/week    Co-evaluation              AM-PAC OT "6 Clicks" Daily Activity     Outcome Measure Help from another person eating meals?: None Help from another person taking care of personal grooming?: A Little Help from another person toileting, which includes using toliet, bedpan, or urinal?: A Little Help from another person bathing (including washing, rinsing, drying)?: A Little Help from another person to  put on and taking off regular upper body clothing?: A Little Help from another person to put on and taking off regular lower body clothing?: A Little 6 Click Score: 19   End of Session Equipment Utilized During Treatment: Rolling walker (2 wheels);Oxygen Nurse Communication: Mobility status  Activity Tolerance: Patient tolerated treatment well Patient left: in chair;with call bell/phone within reach;with chair alarm set (with RN in room at end of session.)  OT Visit  Diagnosis: Other abnormalities of gait and mobility (R26.89);Muscle weakness (generalized) (M62.81)                Time: 1610-9604 OT Time Calculation (min): 31 min Charges:  OT General Charges $OT Visit: 1 Visit OT Evaluation $OT Eval Moderate Complexity: 1 Mod OT Treatments $Self Care/Home Management : 8-22 mins  Rockney Ghee, M.S., OTR/L 05/21/23, 2:36 PM

## 2023-05-21 NOTE — Progress Notes (Signed)
  Echocardiogram 2D Echocardiogram has been performed. Definity IV ultrasound imaging agent used on this study.  Lenor Coffin 05/21/2023, 3:56 PM

## 2023-05-21 NOTE — Assessment & Plan Note (Signed)
BMI 30.90

## 2023-05-22 DIAGNOSIS — J189 Pneumonia, unspecified organism: Secondary | ICD-10-CM | POA: Diagnosis not present

## 2023-05-22 DIAGNOSIS — K810 Acute cholecystitis: Secondary | ICD-10-CM | POA: Diagnosis not present

## 2023-05-22 DIAGNOSIS — N179 Acute kidney failure, unspecified: Secondary | ICD-10-CM | POA: Diagnosis not present

## 2023-05-22 DIAGNOSIS — Z8673 Personal history of transient ischemic attack (TIA), and cerebral infarction without residual deficits: Secondary | ICD-10-CM

## 2023-05-22 DIAGNOSIS — B370 Candidal stomatitis: Secondary | ICD-10-CM

## 2023-05-22 DIAGNOSIS — J9601 Acute respiratory failure with hypoxia: Secondary | ICD-10-CM | POA: Diagnosis not present

## 2023-05-22 LAB — CBC
HCT: 39 % (ref 39.0–52.0)
Hemoglobin: 12.8 g/dL — ABNORMAL LOW (ref 13.0–17.0)
MCH: 32.6 pg (ref 26.0–34.0)
MCHC: 32.8 g/dL (ref 30.0–36.0)
MCV: 99.2 fL (ref 80.0–100.0)
Platelets: 237 10*3/uL (ref 150–400)
RBC: 3.93 MIL/uL — ABNORMAL LOW (ref 4.22–5.81)
RDW: 12.9 % (ref 11.5–15.5)
WBC: 14.7 10*3/uL — ABNORMAL HIGH (ref 4.0–10.5)
nRBC: 0 % (ref 0.0–0.2)

## 2023-05-22 LAB — ECHOCARDIOGRAM COMPLETE
AR max vel: 1.33 cm2
AV Area VTI: 1.68 cm2
AV Area mean vel: 1.45 cm2
AV Mean grad: 21.7 mmHg
AV Peak grad: 37.9 mmHg
Ao pk vel: 3.08 m/s
Area-P 1/2: 2.58 cm2
Height: 73 in
S' Lateral: 3.2 cm
Weight: 3710.78 [oz_av]

## 2023-05-22 LAB — GLUCOSE, CAPILLARY
Glucose-Capillary: 115 mg/dL — ABNORMAL HIGH (ref 70–99)
Glucose-Capillary: 146 mg/dL — ABNORMAL HIGH (ref 70–99)
Glucose-Capillary: 202 mg/dL — ABNORMAL HIGH (ref 70–99)
Glucose-Capillary: 208 mg/dL — ABNORMAL HIGH (ref 70–99)
Glucose-Capillary: 234 mg/dL — ABNORMAL HIGH (ref 70–99)
Glucose-Capillary: 291 mg/dL — ABNORMAL HIGH (ref 70–99)

## 2023-05-22 LAB — BASIC METABOLIC PANEL
Anion gap: 8 (ref 5–15)
BUN: 43 mg/dL — ABNORMAL HIGH (ref 8–23)
CO2: 26 mmol/L (ref 22–32)
Calcium: 7.7 mg/dL — ABNORMAL LOW (ref 8.9–10.3)
Chloride: 105 mmol/L (ref 98–111)
Creatinine, Ser: 1.15 mg/dL (ref 0.61–1.24)
GFR, Estimated: >60 mL/min (ref >60)
Glucose, Bld: 126 mg/dL — ABNORMAL HIGH (ref 70–99)
Potassium: 3.4 mmol/L — ABNORMAL LOW (ref 3.5–5.1)
Sodium: 139 mmol/L (ref 135–145)

## 2023-05-22 LAB — PHOSPHORUS: Phosphorus: 2.1 mg/dL — ABNORMAL LOW (ref 2.5–4.6)

## 2023-05-22 LAB — MAGNESIUM: Magnesium: 2.6 mg/dL — ABNORMAL HIGH (ref 1.7–2.4)

## 2023-05-22 MED ORDER — POTASSIUM CHLORIDE CRYS ER 10 MEQ PO TBCR
20.0000 meq | EXTENDED_RELEASE_TABLET | Freq: Every day | ORAL | Status: DC
  Administered 2023-05-22 – 2023-05-24 (×3): 20 meq via ORAL
  Filled 2023-05-22 (×3): qty 2

## 2023-05-22 MED ORDER — INSULIN ASPART 100 UNIT/ML IJ SOLN
0.0000 [IU] | Freq: Three times a day (TID) | INTRAMUSCULAR | Status: DC
  Administered 2023-05-22: 3 [IU] via SUBCUTANEOUS
  Filled 2023-05-22: qty 1

## 2023-05-22 MED ORDER — APIXABAN 5 MG PO TABS
5.0000 mg | ORAL_TABLET | Freq: Two times a day (BID) | ORAL | Status: DC
  Administered 2023-05-22 – 2023-05-24 (×5): 5 mg via ORAL
  Filled 2023-05-22 (×5): qty 1

## 2023-05-22 MED ORDER — INSULIN ASPART 100 UNIT/ML IJ SOLN: 0.0000 [IU] | Freq: Three times a day (TID) | INTRAMUSCULAR | Status: DC

## 2023-05-22 MED ORDER — FLUCONAZOLE 100 MG PO TABS
100.0000 mg | ORAL_TABLET | Freq: Every day | ORAL | Status: DC
  Administered 2023-05-22 – 2023-05-24 (×3): 100 mg via ORAL
  Filled 2023-05-22 (×3): qty 1

## 2023-05-22 MED ORDER — INSULIN ASPART 100 UNIT/ML IJ SOLN
0.0000 [IU] | Freq: Every day | INTRAMUSCULAR | Status: DC
  Administered 2023-05-22: 3 [IU] via SUBCUTANEOUS
  Administered 2023-05-23: 5 [IU] via SUBCUTANEOUS
  Filled 2023-05-22 (×2): qty 1

## 2023-05-22 MED ORDER — INSULIN ASPART 100 UNIT/ML IJ SOLN
0.0000 [IU] | Freq: Three times a day (TID) | INTRAMUSCULAR | Status: DC
  Administered 2023-05-22 (×2): 3 [IU] via SUBCUTANEOUS
  Administered 2023-05-23: 5 [IU] via SUBCUTANEOUS
  Administered 2023-05-23 (×2): 7 [IU] via SUBCUTANEOUS
  Administered 2023-05-24: 5 [IU] via SUBCUTANEOUS
  Filled 2023-05-22 (×6): qty 1

## 2023-05-22 MED ORDER — FUROSEMIDE 20 MG PO TABS
20.0000 mg | ORAL_TABLET | Freq: Every day | ORAL | Status: DC
  Administered 2023-05-22 – 2023-05-24 (×3): 20 mg via ORAL
  Filled 2023-05-22 (×3): qty 1

## 2023-05-22 NOTE — Assessment & Plan Note (Signed)
Restart Eliquis.

## 2023-05-22 NOTE — Plan of Care (Signed)
Alert, oriented x3. Able to transfer from bed to chair, ambulate to bathroom with rolling walker and then transfer back to bed from chair.  Weaned to room air today.  Bowel movement today.  New IV access obtained today.   Problem: Education: Goal: Ability to describe self-care measures that may prevent or decrease complications (Diabetes Survival Skills Education) will improve Outcome: Progressing Goal: Individualized Educational Video(s) Outcome: Progressing   Problem: Coping: Goal: Ability to adjust to condition or change in health will improve Outcome: Progressing   Problem: Fluid Volume: Goal: Ability to maintain a balanced intake and output will improve Outcome: Progressing   Problem: Health Behavior/Discharge Planning: Goal: Ability to identify and utilize available resources and services will improve Outcome: Progressing Goal: Ability to manage health-related needs will improve Outcome: Progressing   Problem: Metabolic: Goal: Ability to maintain appropriate glucose levels will improve Outcome: Progressing   Problem: Nutritional: Goal: Maintenance of adequate nutrition will improve Outcome: Progressing Goal: Progress toward achieving an optimal weight will improve Outcome: Progressing   Problem: Skin Integrity: Goal: Risk for impaired skin integrity will decrease Outcome: Progressing   Problem: Tissue Perfusion: Goal: Adequacy of tissue perfusion will improve Outcome: Progressing   Problem: Education: Goal: Ability to describe self-care measures that may prevent or decrease complications (Diabetes Survival Skills Education) will improve Outcome: Progressing Goal: Individualized Educational Video(s) Outcome: Progressing   Problem: Cardiac: Goal: Ability to maintain an adequate cardiac output will improve Outcome: Progressing   Problem: Health Behavior/Discharge Planning: Goal: Ability to identify and utilize available resources and services will  improve Outcome: Progressing Goal: Ability to manage health-related needs will improve Outcome: Progressing   Problem: Fluid Volume: Goal: Ability to achieve a balanced intake and output will improve Outcome: Progressing   Problem: Metabolic: Goal: Ability to maintain appropriate glucose levels will improve Outcome: Progressing   Problem: Nutritional: Goal: Maintenance of adequate nutrition will improve Outcome: Progressing Goal: Maintenance of adequate weight for body size and type will improve Outcome: Progressing   Problem: Respiratory: Goal: Will regain and/or maintain adequate ventilation Outcome: Progressing   Problem: Urinary Elimination: Goal: Ability to achieve and maintain adequate renal perfusion and functioning will improve Outcome: Progressing   Problem: Education: Goal: Knowledge of General Education information will improve Description: Including pain rating scale, medication(s)/side effects and non-pharmacologic comfort measures Outcome: Progressing   Problem: Health Behavior/Discharge Planning: Goal: Ability to manage health-related needs will improve Outcome: Progressing   Problem: Clinical Measurements: Goal: Ability to maintain clinical measurements within normal limits will improve Outcome: Progressing Goal: Will remain free from infection Outcome: Progressing Goal: Diagnostic test results will improve Outcome: Progressing Goal: Respiratory complications will improve Outcome: Progressing Goal: Cardiovascular complication will be avoided Outcome: Progressing   Problem: Activity: Goal: Risk for activity intolerance will decrease Outcome: Progressing   Problem: Nutrition: Goal: Adequate nutrition will be maintained Outcome: Progressing   Problem: Coping: Goal: Level of anxiety will decrease Outcome: Progressing   Problem: Elimination: Goal: Will not experience complications related to bowel motility Outcome: Progressing Goal: Will not  experience complications related to urinary retention Outcome: Progressing   Problem: Pain Managment: Goal: General experience of comfort will improve Outcome: Progressing   Problem: Safety: Goal: Ability to remain free from injury will improve Outcome: Progressing   Problem: Skin Integrity: Goal: Risk for impaired skin integrity will decrease Outcome: Progressing

## 2023-05-22 NOTE — Assessment & Plan Note (Signed)
Oral Diflucan

## 2023-05-22 NOTE — Progress Notes (Signed)
Patient ID: Mathew Baker, male   DOB: Apr 17, 1941, 82 y.o.   MRN: [id number]     SURGICAL PROGRESS NOTE   Hospital Day(s): 3.   Interval History: Patient seen and examined, no acute events or new complaints overnight. Patient reports feeling well.  He denies abdominal pain.  He endorses he ate pot roast last night without abdominal pain.  Has any chest pain or shortness of breath  Vital signs in last 24 hours: [min-max] current  Temp:  [98.2 F (36.8 C)-99.6 F (37.6 C)] 98.6 F (37 C) (09/15 0756) Pulse Rate:  [62-76] 62 (09/15 0756) Resp:  [15-20] 15 (09/15 0756) BP: (125-146)/(60-71) 146/65 (09/15 0756) SpO2:  [86 %-98 %] 98 % (09/15 0756)     Height: 6\' 1"  (185.4 cm) Weight: 105.2 kg BMI (Calculated): 30.61   Physical Exam:  Constitutional: alert, cooperative and no distress  Respiratory: breathing non-labored at rest  Cardiovascular: regular rate and sinus rhythm  Gastrointestinal: soft, non-tender, and non-distended.  Cholecystostomy drain in place  Labs:     Latest Ref Rng & Units 05/22/2023    3:02 AM 05/21/2023    4:43 AM 05/20/2023    5:06 AM  CBC  WBC 4.0 - 10.5 K/uL 14.7  18.0  20.9   Hemoglobin 13.0 - 17.0 g/dL 13.0  86.5  78.4   Hematocrit 39.0 - 52.0 % 39.0  39.5  41.4   Platelets 150 - 400 K/uL 237  215  210       Latest Ref Rng & Units 05/22/2023    3:02 AM 05/21/2023    4:43 AM 05/20/2023    5:06 AM  CMP  Glucose 70 - 99 mg/dL 696  295  284   BUN 8 - 23 mg/dL 43  45  58   Creatinine 0.61 - 1.24 mg/dL 1.32  4.40  1.02   Sodium 135 - 145 mmol/L 139  137  142   Potassium 3.5 - 5.1 mmol/L 3.4  3.4  3.5   Chloride 98 - 111 mmol/L 105  103  107   CO2 22 - 32 mmol/L 26  24  25    Calcium 8.9 - 10.3 mg/dL 7.7  7.6  8.2   Total Protein 6.5 - 8.1 g/dL   6.2   Total Bilirubin 0.3 - 1.2 mg/dL   1.7   Alkaline Phos 38 - 126 U/L   98   AST 15 - 41 U/L   60   ALT 0 - 44 U/L   34     Imaging studies: No new pertinent imaging studies   Assessment/Plan:  82 y.o.  male with acute cholecystitis   s/p percutaneous cholecystostomy, complicated by pertinent comorbidities including COPD, hypertension, history of stroke, A-fib and on anticoagulation.   -Patient with acute cholecystitis s/p percutaneous cholecystostomy.  -Responding well to drain and antibiotic therapy -Tolerated diet without pain -Continue adequate decrease in white blood cell count. -No contraindication to discharge from surgical standpoint once medically stable.  Patient will be followed by Dr. Everlene Farrier for management of cholecystostomy tube.   Gae Gallop, MD

## 2023-05-22 NOTE — Progress Notes (Signed)
Progress Note   PatientJeriah Baker ZOX:[id number] DOB: 1940-09-18 DOA: 05/19/2023     3 DOS: the patient was seen and examined on 05/22/2023   Brief hospital course: 82 year old man with history of COPD, hypertension, type 2 diabetes mellitus, hyperlipidemia, stroke presents with syncopal episode and found to have acute cholecystitis on CT scan.  Percutaneous cholecystectomy tube placed by interventional radiology on 9/13.  Patient empirically on Zosyn.  9/14.  Patient on 5 L of oxygen this morning.  Has some pain in his right upper quadrant.  Some cough. 9/15.  Patient on 2 L of oxygen this morning.  Still having some right upper quadrant abdominal pain.  Assessment and Plan: * Cholecystitis, acute Percutaneous cholecystectomy tube placed on 9/13 by interventional radiology.  On empiric Zosyn.  Did not meet the SIRS criteria for sepsis even though lactic acid was high.  Only had leukocytosis.  White blood cell count trending better to 14.7.  Acute hypoxic respiratory failure (HCC) Yesterday had a pulse ox of 86% on room air.  This morning patient on 2 L of oxygen.  Incentive spirometer.  Continue oral Lasix.  Check to see if we can get off oxygen on a daily basis.  Right upper lobe pneumonia On Zosyn.  History of stroke Restart Eliquis.  Thrush Oral Diflucan  Hypokalemia Replace orally  Obesity (BMI 30-39.9) BMI 30.60  AKI (acute kidney injury) (HCC) Creatinine 1.57 on presentation down to 1.15.  Uncontrolled type 2 diabetes mellitus with hyperglycemia, with long-term current use of insulin (HCC) Patient on sliding scale insulin.  Continue Semglee insulin.        Subjective: Patient feeling better with regards to his breathing.  Still having right upper quadrant abdominal pain.  No nausea or vomiting.  Takes Eliquis for history of stroke.  Admitted with acute cholecystitis.  Physical Exam: Vitals:   05/21/23 2100 05/22/23 0025 05/22/23 0349 05/22/23 0756  BP:  139/65 139/63 130/71 (!) 146/65  Pulse:  66 66 62  Resp: 18 20  15   Temp: 98.7 F (37.1 C) 99.6 F (37.6 C) 99.2 F (37.3 C) 98.6 F (37 C)  TempSrc: Oral Oral Oral Oral  SpO2: 96% 97% 98% 98%  Weight:      Height:       Physical Exam HENT:     Head: Normocephalic.     Mouth/Throat:     Comments: Positive for thrush. Eyes:     General: Lids are normal.     Conjunctiva/sclera: Conjunctivae normal.  Cardiovascular:     Rate and Rhythm: Normal rate and regular rhythm.     Heart sounds: Normal heart sounds, S1 normal and S2 normal.  Pulmonary:     Breath sounds: Examination of the right-lower field reveals decreased breath sounds. Examination of the left-lower field reveals decreased breath sounds. Decreased breath sounds present. No wheezing, rhonchi or rales.  Abdominal:     Palpations: Abdomen is soft.     Tenderness: There is abdominal tenderness in the right upper quadrant.  Musculoskeletal:     Right lower leg: Swelling present.     Left lower leg: Swelling present.  Skin:    General: Skin is warm.     Findings: No rash.  Neurological:     Mental Status: He is alert and oriented to person, place, and time.     Data Reviewed: Creatinine 1.15, potassium 3.4, white blood cell count 14.7, hemoglobin 12.8  Family Communication: Left message for patient's daughter  Disposition: Status is: Inpatient  Remains inpatient appropriate because: Continue IV Zosyn for acute cholecystitis.  Continue ambulating with physical therapy.  Try to get off oxygen prior to disposition.  Planned Discharge Destination: Home with Home Health    Time spent: 28 minutes  Author: Alford Highland, MD 05/22/2023 12:28 PM  For on call review [email or web address].

## 2023-05-22 NOTE — TOC Initial Note (Signed)
Transition of Care [facility]) - Initial/Assessment Note    Patient Details  Name: Mathew Baker MRN: [id number] Date of Birth: Mar 13, 1941  Transition of Care [facility]) CM/SW Contact:    Darolyn Rua, LCSW Phone Number: 05/22/2023, 1:09 PM  Clinical Narrative:                  CSW notes patient not fully oriented, CSW spoke with patient's daughter Lurena Joiner regarding home health recs. She is in agreement with preference for [facility] as she reports they had them in the past in 2022 and liked their staff.   She confirms PCP is Daryl [facility] Reports no dme needs patient has a RW and cane at home  Reports patient is from home with wife, at discharge daughter Lurena Joiner to transport home  No additional needs at this time.    Expected Discharge Plan: Home w Home Health Services Barriers to Discharge: Continued Medical Work up   Patient Goals and CMS Choice Patient states their goals for this hospitalization and ongoing recovery are:: to go home CMS Medicare.gov Compare Post Acute Care list provided to:: Patient Choice offered to / list presented to : Patient      Expected Discharge Plan and Services       Living arrangements for the past 2 months: Single Family Home                             HH Agency: [facility]        Prior Living Arrangements/Services Living arrangements for the past 2 months: Single Family Home Lives with:: Spouse                   Activities of Daily Living Home Assistive Devices/Equipment: None ADL Screening (condition at time of admission) Patient's cognitive ability adequate to safely complete daily activities?: Yes Is the patient deaf or have difficulty hearing?: Yes Does the patient have difficulty seeing, even when wearing glasses/contacts?: No Does the patient have difficulty concentrating, remembering, or making decisions?: No Patient able to express need for assistance with ADLs?: Yes Does the patient  have difficulty dressing or bathing?: No Independently performs ADLs?: Yes (appropriate for developmental age) Does the patient have difficulty walking or climbing stairs?: No Weakness of Legs: Both Weakness of Arms/Hands: Both (states his hands feel numb)  Permission Sought/Granted                  Emotional Assessment              Admission diagnosis:  Acute cholecystitis [K81.0] Patient Active Problem List   Diagnosis Date Noted   Thrush 05/22/2023   History of stroke 05/22/2023   Acute hypoxic respiratory failure (HCC) 05/21/2023   Right upper lobe pneumonia 05/21/2023   AKI (acute kidney injury) (HCC) 05/21/2023   Obesity (BMI 30-39.9) 05/21/2023   Hypokalemia 05/21/2023   Cholecystitis, acute 05/19/2023   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   Unspecified open-angle glaucoma, indeterminate stage 03/24/2021   COVID-19 virus infection 09/09/2020   Syncope 09/09/2020   Elevated troponin 09/09/2020   Atrial fibrillation, chronic (HCC) 09/09/2020   Hypotension 09/09/2020   Bilateral carotid artery disease (HCC) 06/07/2019   Uses walker 06/07/2019   Bradycardia 03/19/2019   Chest pain 12/20/2016   HTN (hypertension) 12/20/2016   HLD (hyperlipidemia) 12/20/2016   Syncope and collapse 12/20/2016   Uncontrolled type 2 diabetes mellitus with microalbuminuria 08/21/2014  Uncontrolled type 2 diabetes mellitus with hyperglycemia, with long-term current use of insulin (HCC) 08/21/2014   PCP:  Lauro Regulus, MD Pharmacy:   [facility] - [place], [place] - [address] [place] [address] Phone: [phone] Fax: [phone]  [id number] [place], [place] - [address] [place] [address] Phone: [phone] Fax: [phone]     Social Determinants of Health (SDOH) Social History: SDOH Screenings   Food Insecurity: No Food  Insecurity (05/19/2023)  Housing: Low Risk  (05/19/2023)  Transportation Needs: No Transportation Needs (05/19/2023)  Utilities: Not At Risk (05/19/2023)  Tobacco Use: Medium Risk (05/19/2023)   SDOH Interventions:     Readmission Risk Interventions     No data to display

## 2023-05-23 DIAGNOSIS — K810 Acute cholecystitis: Secondary | ICD-10-CM | POA: Diagnosis not present

## 2023-05-23 DIAGNOSIS — J9601 Acute respiratory failure with hypoxia: Secondary | ICD-10-CM | POA: Diagnosis not present

## 2023-05-23 DIAGNOSIS — N179 Acute kidney failure, unspecified: Secondary | ICD-10-CM | POA: Diagnosis not present

## 2023-05-23 DIAGNOSIS — J189 Pneumonia, unspecified organism: Secondary | ICD-10-CM | POA: Diagnosis not present

## 2023-05-23 LAB — BLOOD GAS, VENOUS
Acid-base deficit: 7 mmol/L — ABNORMAL HIGH (ref 0.0–2.0)
Bicarbonate: 17.8 mmol/L — ABNORMAL LOW (ref 20.0–28.0)
O2 Saturation: 37.8 %
Patient temperature: 37
pCO2, Ven: 33 mmHg — ABNORMAL LOW (ref 44–60)
pH, Ven: 7.34 (ref 7.25–7.43)

## 2023-05-23 LAB — MAGNESIUM: Magnesium: 2.4 mg/dL (ref 1.7–2.4)

## 2023-05-23 LAB — BASIC METABOLIC PANEL
Anion gap: 10 (ref 5–15)
BUN: 32 mg/dL — ABNORMAL HIGH (ref 8–23)
CO2: 25 mmol/L (ref 22–32)
Calcium: 7.9 mg/dL — ABNORMAL LOW (ref 8.9–10.3)
Chloride: 99 mmol/L (ref 98–111)
Creatinine, Ser: 0.92 mg/dL (ref 0.61–1.24)
GFR, Estimated: >60 mL/min (ref >60)
Glucose, Bld: 282 mg/dL — ABNORMAL HIGH (ref 70–99)
Potassium: 3.7 mmol/L (ref 3.5–5.1)
Sodium: 134 mmol/L — ABNORMAL LOW (ref 135–145)

## 2023-05-23 LAB — PHOSPHORUS: Phosphorus: 2.1 mg/dL — ABNORMAL LOW (ref 2.5–4.6)

## 2023-05-23 LAB — CBC
HCT: 39.7 % (ref 39.0–52.0)
Hemoglobin: 13.2 g/dL (ref 13.0–17.0)
MCH: 32.9 pg (ref 26.0–34.0)
MCHC: 33.2 g/dL (ref 30.0–36.0)
MCV: 99 fL (ref 80.0–100.0)
Platelets: 268 10*3/uL (ref 150–400)
RBC: 4.01 MIL/uL — ABNORMAL LOW (ref 4.22–5.81)
RDW: 13.1 % (ref 11.5–15.5)
WBC: 13.1 10*3/uL — ABNORMAL HIGH (ref 4.0–10.5)
nRBC: 0 % (ref 0.0–0.2)

## 2023-05-23 LAB — GLUCOSE, CAPILLARY
Glucose-Capillary: 243 mg/dL — ABNORMAL HIGH (ref 70–99)
Glucose-Capillary: 303 mg/dL — ABNORMAL HIGH (ref 70–99)
Glucose-Capillary: 306 mg/dL — ABNORMAL HIGH (ref 70–99)
Glucose-Capillary: 368 mg/dL — ABNORMAL HIGH (ref 70–99)

## 2023-05-23 MED ORDER — INSULIN GLARGINE-YFGN 100 UNIT/ML ~~LOC~~ SOLN
35.0000 [IU] | Freq: Every day | SUBCUTANEOUS | Status: DC
  Administered 2023-05-23: 35 [IU] via SUBCUTANEOUS
  Filled 2023-05-23: qty 0.35

## 2023-05-23 MED ORDER — INSULIN GLARGINE-YFGN 100 UNIT/ML ~~LOC~~ SOLN
50.0000 [IU] | Freq: Every day | SUBCUTANEOUS | Status: DC
Start: 2023-05-23 — End: 2023-05-23

## 2023-05-23 NOTE — Plan of Care (Signed)

## 2023-05-23 NOTE — Progress Notes (Signed)
Progress Note   PatientGarrie Baker AOZ:[id number] DOB: 10-15-1940 DOA: 05/19/2023     4 DOS: the patient was seen and examined on 05/23/2023   Brief hospital course: 82 year old man with history of COPD, hypertension, type 2 diabetes mellitus, hyperlipidemia, stroke presents with syncopal episode and found to have acute cholecystitis on CT scan.  Percutaneous cholecystectomy tube placed by interventional radiology on 9/13.  Patient empirically on Zosyn.  9/14.  Patient on 5 L of oxygen this morning.  Has some pain in his right upper quadrant.  Some cough. 9/15.  Patient on 2 L of oxygen this morning.  Still having some right upper quadrant abdominal pain. 9/16.  Patient off oxygen this morning.  Still having right upper quadrant pain.  Assessment and Plan: * Cholecystitis, acute Percutaneous cholecystectomy tube placed on 9/13 by interventional radiology.  On empiric Zosyn.  Did not meet the SIRS criteria for sepsis even though lactic acid was high.  Only had leukocytosis.  White blood cell count trending better to 13.1.  Acute hypoxic respiratory failure (HCC) 2 days ago had a pulse ox of 86% on room air.  This morning on room air.  Incentive spirometer.  Continue oral Lasix.    Right upper lobe pneumonia On Zosyn.  History of stroke Continue Eliquis.  Thrush Oral Diflucan  Hypokalemia Replaced  Obesity (BMI 30-39.9) BMI 30.31  AKI (acute kidney injury) (HCC) Creatinine 1.57 on presentation down to 0.92.  Uncontrolled type 2 diabetes mellitus with hyperglycemia, with long-term current use of insulin (HCC) Patient on sliding scale insulin.  Increase Semglee insulin to 35 units nightly.  Patient states he takes Lantus 50 units twice a day at home.         Subjective: Patient does have some right upper quadrant pain.  Tolerating diet.  Had a bowel movement.  Off oxygen this morning  Physical Exam: Vitals:   05/23/23 0344 05/23/23 0729 05/23/23 1201 05/23/23 1509   BP: (!) 166/64 134/66 (!) 154/63 (!) 143/53  Pulse: 64 62 70 72  Resp: 18 (!) 22 20 20   Temp: 98.7 F (37.1 C) 98 F (36.7 C) 98.3 F (36.8 C) 98 F (36.7 C)  TempSrc:  Oral Oral Oral  SpO2: 95% 94% 95% 95%  Weight:  104.2 kg    Height:       Physical Exam HENT:     Head: Normocephalic.     Mouth/Throat:     Comments: Positive for thrush. Eyes:     General: Lids are normal.     Conjunctiva/sclera: Conjunctivae normal.  Cardiovascular:     Rate and Rhythm: Normal rate and regular rhythm.     Heart sounds: Normal heart sounds, S1 normal and S2 normal.  Pulmonary:     Breath sounds: Examination of the right-lower field reveals decreased breath sounds. Examination of the left-lower field reveals decreased breath sounds. Decreased breath sounds present. No wheezing, rhonchi or rales.  Abdominal:     Palpations: Abdomen is soft.     Tenderness: There is abdominal tenderness in the right upper quadrant.  Musculoskeletal:     Right lower leg: Swelling present.     Left lower leg: Swelling present.  Skin:    General: Skin is warm.     Findings: No rash.  Neurological:     Mental Status: He is alert and oriented to person, place, and time.     Data Reviewed: Creatinine 0.92, sodium 134, white blood cell count 13.1, hemoglobin 13.2, platelet count 268  Family Communication: Left message for patient's daughter  Disposition: Status is: Inpatient Remains inpatient appropriate because: Continue IV antibiotics today and reassess tomorrow.  Planned Discharge Destination: Home with Home Health    Time spent: 28 minutes  Author: Alford Highland, MD 05/23/2023 4:22 PM  For on call review [email or web address].

## 2023-05-23 NOTE — Inpatient Diabetes Management (Signed)
Inpatient Diabetes Program Recommendations  AACE/ADA: New Consensus Statement on Inpatient Glycemic Control (2015)  Target Ranges:  Prepandial:   less than 140 mg/dL      Peak postprandial:   less than 180 mg/dL (1-2 hours)      Critically ill patients:  140 - 180 mg/dL   Lab Results  Component Value Date   GLUCAP 243 (H) 05/23/2023   HGBA1C 8.7 (H) 05/19/2023    Review of Glycemic Control  Latest Reference Range & Units 05/22/23 07:56 05/22/23 12:36 05/22/23 16:34 05/22/23 20:45 05/23/23 07:26  Glucose-Capillary 70 - 99 mg/dL 161 (H) 096 (H) 045 (H) 291 (H) 243 (H)  (H): Data is abnormally high  Diabetes history: DM2 Outpatient Diabetes medications:  Lantus 70 units BID Novolog 0-9 units TID  Jardiance 25 mg every day Trulicity 4.5 mg weekly Current orders for Inpatient glycemic control:  Semglee 20 units at bedtime Novolog 0-9 units TID and 0-5 units QHS  Inpatient Diabetes Program Recommendations:    Glucose is trending up.  Might consider:  Semglee 10 units BID.  Will continue to follow while inpatient.  Thank you, Dulce Sellar, MSN, CDCES Diabetes Coordinator Inpatient Diabetes Program [phone] (team pager from 8a-5p)

## 2023-05-23 NOTE — Plan of Care (Signed)
30 ml output from biliary tube over the past 24 hrs     Problem: Fluid Volume: Goal: Ability to maintain a balanced intake and output will improve Outcome: Progressing   Problem: Health Behavior/Discharge Planning: Goal: Ability to identify and utilize available resources and services will improve Outcome: Progressing Goal: Ability to manage health-related needs will improve Outcome: Progressing   Problem: Metabolic: Goal: Ability to maintain appropriate glucose levels will improve Outcome: Progressing   Problem: Skin Integrity: Goal: Risk for impaired skin integrity will decrease Outcome: Progressing   Problem: Activity: Goal: Risk for activity intolerance will decrease Outcome: Progressing   Problem: Skin Integrity: Goal: Risk for impaired skin integrity will decrease Outcome: Progressing

## 2023-05-23 NOTE — Progress Notes (Addendum)
Physical Therapy Treatment Patient Details Name: Mathew Baker MRN: [id number] DOB: 1941-05-29 Today's Date: 05/23/2023   History of Present Illness 82 y/o male with h/o DM, HTN, HLD, Afib, CAD, IDA and COPD who is admitted with sepsis, AKI, acute cholecystitis and DKA. As of 9/13, he is s/p percutaneous cholecystostomy.    PT Comments  Pt presents sitting in recliner with daughter in room, no complaints of pain. Pt able to perform sit<>stand with supervision/RW, ambulate ~12ft CGA/RW, and perform sit>supine with supervision. He required multiple standing rest breaks due to fatigue, increasing in frequency at the end of ambulation (SPO2 remained >90% on RA throughout mobility). PT observed some intermittent decreased safety awareness in hallway, requiring therapist intervention to maintain safety. Pt reports 8/10 RPE at the end of today's session and would benefit from continued skilled therapy to maximize functional abilities and address deficits in activity tolerance.       If plan is discharge home, recommend the following: A little help with walking and/or transfers;A little help with bathing/dressing/bathroom;Assistance with cooking/housework;Help with stairs or ramp for entrance   Can travel by private vehicle        Equipment Recommendations  None recommended by PT    Recommendations for Other Services       Precautions / Restrictions Precautions Precautions: Fall Restrictions Weight Bearing Restrictions: No     Mobility  Bed Mobility Overal bed mobility: Needs Assistance Bed Mobility: Sit to Supine       Sit to supine: Supervision        Transfers Overall transfer level: Needs assistance Equipment used: Rolling walker (2 wheels) Transfers: Sit to/from Stand Sit to Stand: Supervision           General transfer comment: Per RN, pt experiencing swollen genitals and encouraged to reposition before sitting.    Ambulation/Gait Ambulation/Gait assistance:  Contact guard assist Gait Distance (Feet): 120 Feet Assistive device: Rolling walker (2 wheels) Gait Pattern/deviations: Decreased step length - right, Decreased step length - left Gait velocity: decreased     General Gait Details: Required multiple standing rest breaks due to fatigue, increasing in frequency at the end of ambulation. Pt reports 8/10 RPE at end of ambulation   Stairs             Wheelchair Mobility     Tilt Bed    Modified Rankin (Stroke Patients Only)       Balance Overall balance assessment: Needs assistance Sitting-balance support: No upper extremity supported, Feet supported Sitting balance-Leahy Scale: Good     Standing balance support: Bilateral upper extremity supported, During functional activity Standing balance-Leahy Scale: Fair                              Cognition Arousal: Alert Behavior During Therapy: WFL for tasks assessed/performed Overall Cognitive Status: Within Functional Limits for tasks assessed                                 General Comments: Some intermittent decreased safety awareness with activity in hallway requiring therapist intervention to maintain safety (would reach to grab rail while walking with RW)        Exercises      General Comments General comments (skin integrity, edema, etc.): Pt on RA throughout session, SPO2 remained >90% with mobility      Pertinent Vitals/Pain Pain Assessment Pain Assessment: No/denies pain  Home Living                          Prior Function            PT Goals (current goals can now be found in the care plan section) Acute Rehab PT Goals Patient Stated Goal: to return home Progress towards PT goals: Progressing toward goals    Frequency    Min 1X/week      PT Plan      Co-evaluation              AM-PAC PT "6 Clicks" Mobility   Outcome Measure  Help needed turning from your back to your side while in a flat  bed without using bedrails?: A Little Help needed moving from lying on your back to sitting on the side of a flat bed without using bedrails?: A Little Help needed moving to and from a bed to a chair (including a wheelchair)?: A Little Help needed standing up from a chair using your arms (e.g., wheelchair or bedside chair)?: A Little Help needed to walk in hospital room?: A Little Help needed climbing 3-5 steps with a railing? : A Lot 6 Click Score: 17    End of Session Equipment Utilized During Treatment: Gait belt Activity Tolerance: Patient tolerated treatment well Patient left: in bed;with call bell/phone within reach;with family/visitor present Nurse Communication: Other (comment) (RN notified/aware of blood spots on bed/gown following bed mobility, pt left with clean gown) PT Visit Diagnosis: Muscle weakness (generalized) (M62.81);Other abnormalities of gait and mobility (R26.89)     Time: 1040-1105 PT Time Calculation (min) (ACUTE ONLY): 25 min  Charges:    $Therapeutic Activity: 23-37 mins PT General Charges $$ ACUTE PT VISIT: 1 Visit                     Esiah Bazinet, PT, SPT 11:57 AM,05/23/23

## 2023-05-23 NOTE — Progress Notes (Addendum)
CC: Cholecystitis Subjective: Feeling better, minimal abd discomfort, chole tube working well Wbc down  Objective: Vital signs in last 24 hours: Temp:  [98 F (36.7 C)-99.2 F (37.3 C)] 98.3 F (36.8 C) (09/16 1201) Pulse Rate:  [62-70] 70 (09/16 1201) Resp:  [15-22] 20 (09/16 1201) BP: (129-166)/(63-80) 154/63 (09/16 1201) SpO2:  [94 %-97 %] 95 % (09/16 1201) Weight:  [104.2 kg] 104.2 kg (09/16 0729) Last BM Date : 05/23/23  Intake/Output from previous day: 09/15 0701 - 09/16 0700 In: 945.2 [P.O.:720; I.V.:75.2; IV Piggyback:150] Out: 1380 [Urine:1350; Drains:30] Intake/Output this shift: Total I/O In: 393.9 [P.O.:300; I.V.:33.9; Other:10; IV Piggyback:50] Out: 300 [Urine:300]  Physical exam: NAD alert Abd: soft, Nt significant improvement, no peritonitis or Murphy Ext: well perfused and warm   Lab Results: CBC  Recent Labs    05/22/23 0302 05/23/23 0444  WBC 14.7* 13.1*  HGB 12.8* 13.2  HCT 39.0 39.7  PLT 237 268   BMET Recent Labs    05/22/23 0302 05/23/23 0444  NA 139 134*  K 3.4* 3.7  CL 105 99  CO2 26 25  GLUCOSE 126* 282*  BUN 43* 32*  CREATININE 1.15 0.92  CALCIUM 7.7* 7.9*   PT/INR No results for input(s): "LABPROT", "INR" in the last 72 hours. ABG No results for input(s): "PHART", "HCO3" in the last 72 hours.  Invalid input(s): "PCO2", "PO2"  Studies/Results: ECHOCARDIOGRAM COMPLETE  Result Date: 05/22/2023    ECHOCARDIOGRAM REPORT   Patient Name:   Mathew Baker Date of Exam: 05/21/2023 Medical Rec #:  [id number]     Height:       73.0 in Accession #:    [id number]    Weight:       231.9 lb Date of Birth:  18-Jan-1941     BSA:          2.291 m Patient Age:    82 years      BP:           125/60 mmHg Patient Gender: M             HR:           77 bpm. 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  Sonographer:     Overton Mam RDCS, FASE Referring Phys:  [id number] BRITTON L [place] Diagnosing Phys: Marcina Millard MD  Sonographer Comments: Technically difficult study due to poor echo windows, no subcostal window and suboptimal apical window. Image acquisition challenging due to respiratory motion and Image acquisition challenging due to patient body habitus. IMPRESSIONS  1. Left ventricular ejection fraction, by estimation, is 60 to 65%. The left ventricle has normal function. The left ventricle has no regional wall motion abnormalities. Left ventricular diastolic parameters are consistent with Grade I diastolic dysfunction (impaired relaxation).  2. Right ventricular systolic function is normal. The right ventricular size is normal.  3. The mitral valve is normal in structure. Mild mitral valve regurgitation. No evidence of mitral stenosis.  4. The aortic valve is normal in structure. Aortic valve regurgitation is not visualized. Mild to moderate aortic valve stenosis.  5. The inferior vena cava is normal in size with greater than 50% respiratory variability, suggesting right atrial pressure of 3  mmHg. FINDINGS  Left Ventricle: Left ventricular ejection fraction, by estimation, is 60 to 65%. The left ventricle has normal function. The left ventricle has no regional wall motion abnormalities. Definity contrast agent was given IV to delineate the left ventricular  endocardial borders. The left ventricular internal cavity size was normal in size. There is no left ventricular hypertrophy. Left ventricular diastolic parameters are consistent with Grade I diastolic dysfunction (impaired relaxation). Right Ventricle: The right ventricular size is normal. No increase in right ventricular wall thickness. Right ventricular systolic function is normal. Left Atrium: Left atrial size was normal in size. Right Atrium: Right atrial size was normal in size. Pericardium: There  is no evidence of pericardial effusion. Mitral Valve: The mitral valve is normal in structure. Mild mitral valve regurgitation. No evidence of mitral valve stenosis. Tricuspid Valve: The tricuspid valve is normal in structure. Tricuspid valve regurgitation is mild . No evidence of tricuspid stenosis. Aortic Valve: The aortic valve is normal in structure. Aortic valve regurgitation is not visualized. Mild to moderate aortic stenosis is present. Aortic valve mean gradient measures 21.7 mmHg. Aortic valve peak gradient measures 37.9 mmHg. Aortic valve area, by VTI measures 1.68 cm. Pulmonic Valve: The pulmonic valve was normal in structure. Pulmonic valve regurgitation is not visualized. No evidence of pulmonic stenosis. Aorta: The aortic root is normal in size and structure. Venous: The inferior vena cava is normal in size with greater than 50% respiratory variability, suggesting right atrial pressure of 3 mmHg. IAS/Shunts: No atrial level shunt detected by color flow Doppler.  LEFT VENTRICLE PLAX 2D LVIDd:         5.00 cm   Diastology LVIDs:         3.20 cm   LV e' medial:    4.79 cm/s LV PW:         1.20 cm   LV E/e' medial:  18.6 LV IVS:        1.30 cm   LV e' lateral:   5.33 cm/s LVOT diam:     2.10 cm   LV E/e' lateral: 16.7 LV SV:         98 LV SV Index:   43 LVOT Area:     3.46 cm  RIGHT VENTRICLE RV Basal diam:  3.70 cm RV S prime:     22.10 cm/s TAPSE (M-mode): 2.6 cm LEFT ATRIUM             Index        RIGHT ATRIUM           Index LA diam:        3.60 cm 1.57 cm/m   RA Area:     14.70 cm LA Vol (A2C):   52.0 ml 22.69 ml/m  RA Volume:   38.20 ml  16.67 ml/m LA Vol (A4C):   30.8 ml 13.44 ml/m LA Biplane Vol: 40.5 ml 17.67 ml/m  AORTIC VALVE                     PULMONIC VALVE AV Area (Vmax):    1.33 cm      PV Vmax:        1.20 m/s AV Area (Vmean):   1.45 cm      PV Peak grad:   5.8 mmHg AV Area (VTI):     1.68 cm      RVOT Peak grad: 4 mmHg AV Vmax:           307.67 cm/s  AV Vmean:          214.333  cm/s AV VTI:            0.583 m AV Peak Grad:      37.9 mmHg AV Mean Grad:      21.7 mmHg LVOT Vmax:         118.00 cm/s LVOT Vmean:        89.500 cm/s LVOT VTI:          0.282 m LVOT/AV VTI ratio: 0.48  AORTA Ao Root diam: 3.90 cm MITRAL VALVE MV Area (PHT): 2.58 cm     SHUNTS MV Decel Time: 294 msec     Systemic VTI:  0.28 m MV E velocity: 89.10 cm/s   Systemic Diam: 2.10 cm MV A velocity: 102.00 cm/s MV E/A ratio:  0.87 Marcina Millard MD Electronically signed by Marcina Millard MD Signature Date/Time: 05/22/2023/11:33:09 AM    Final     Anti-infectives: Anti-infectives (From admission, onward)    Start     Dose/Rate Route Frequency Ordered Stop   05/22/23 1100  fluconazole (DIFLUCAN) tablet 100 mg        100 mg Oral Daily 05/22/23 1002     05/19/23 2230  piperacillin-tazobactam (ZOSYN) IVPB 3.375 g        3.375 g 12.5 mL/hr over 240 Minutes Intravenous Every 8 hours 05/19/23 2216     05/19/23 1800  ceFEPIme (MAXIPIME) 2 g in sodium chloride 0.9 % 100 mL IVPB        2 g 200 mL/hr over 30 Minutes Intravenous  Once 05/19/23 1755 05/19/23 1909   05/19/23 1800  vancomycin (VANCOCIN) IVPB 1000 mg/200 mL premix        1,000 mg 200 mL/hr over 60 Minutes Intravenous  Once 05/19/23 1755 05/19/23 2154       Assessment/Plan: Cholecystitis responding to chole tube We will f/u as outpt No need for immediate surgical intervention  HE will benefit from cholecystectomy in 2-3 months  We will be Bary Leriche, MD, FACS  05/23/2023

## 2023-05-23 NOTE — Care Management Important Message (Signed)
Important Message  Patient Details  Name: Mathew Baker MRN: [id number] Date of Birth: 1941-05-17   Medicare Important Message Given:  Yes     [name] 05/23/2023, 11:14 AM

## 2023-05-24 DIAGNOSIS — J9601 Acute respiratory failure with hypoxia: Secondary | ICD-10-CM | POA: Diagnosis not present

## 2023-05-24 DIAGNOSIS — N179 Acute kidney failure, unspecified: Secondary | ICD-10-CM | POA: Diagnosis not present

## 2023-05-24 DIAGNOSIS — J181 Lobar pneumonia, unspecified organism: Secondary | ICD-10-CM | POA: Diagnosis not present

## 2023-05-24 DIAGNOSIS — K810 Acute cholecystitis: Secondary | ICD-10-CM | POA: Diagnosis not present

## 2023-05-24 LAB — CBC
HCT: 43 % (ref 39.0–52.0)
Hemoglobin: 14.1 g/dL (ref 13.0–17.0)
MCH: 32.8 pg (ref 26.0–34.0)
MCHC: 32.8 g/dL (ref 30.0–36.0)
MCV: 100 fL (ref 80.0–100.0)
Platelets: 323 10*3/uL (ref 150–400)
RBC: 4.3 MIL/uL (ref 4.22–5.81)
RDW: 13 % (ref 11.5–15.5)
WBC: 11.9 10*3/uL — ABNORMAL HIGH (ref 4.0–10.5)
nRBC: 0 % (ref 0.0–0.2)

## 2023-05-24 LAB — CULTURE, BLOOD (ROUTINE X 2)
Culture: NO GROWTH
Culture: NO GROWTH
Special Requests: ADEQUATE

## 2023-05-24 LAB — COMPREHENSIVE METABOLIC PANEL
ALT: 99 U/L — ABNORMAL HIGH (ref 0–44)
AST: 131 U/L — ABNORMAL HIGH (ref 15–41)
Albumin: 2.2 g/dL — ABNORMAL LOW (ref 3.5–5.0)
Alkaline Phosphatase: 253 U/L — ABNORMAL HIGH (ref 38–126)
Anion gap: 10 (ref 5–15)
BUN: 28 mg/dL — ABNORMAL HIGH (ref 8–23)
CO2: 24 mmol/L (ref 22–32)
Calcium: 8.1 mg/dL — ABNORMAL LOW (ref 8.9–10.3)
Chloride: 103 mmol/L (ref 98–111)
Creatinine, Ser: 0.89 mg/dL (ref 0.61–1.24)
GFR, Estimated: >60 mL/min (ref >60)
Glucose, Bld: 327 mg/dL — ABNORMAL HIGH (ref 70–99)
Potassium: 4 mmol/L (ref 3.5–5.1)
Sodium: 137 mmol/L (ref 135–145)
Total Bilirubin: 1.1 mg/dL (ref 0.3–1.2)
Total Protein: 6.5 g/dL (ref 6.5–8.1)

## 2023-05-24 LAB — GLUCOSE, CAPILLARY: Glucose-Capillary: 297 mg/dL — ABNORMAL HIGH (ref 70–99)

## 2023-05-24 MED ORDER — PANTOPRAZOLE SODIUM 40 MG PO TBEC: 40.0000 mg | DELAYED_RELEASE_TABLET | Freq: Every day | ORAL | 0 refills | Status: DC

## 2023-05-24 MED ORDER — FLUCONAZOLE 100 MG PO TABS: 100.0000 mg | ORAL_TABLET | Freq: Every day | ORAL | 0 refills | Status: DC

## 2023-05-24 MED ORDER — AMOXICILLIN-POT CLAVULANATE 875-125 MG PO TABS: 1.0000 | ORAL_TABLET | Freq: Two times a day (BID) | ORAL | 0 refills | Status: AC

## 2023-05-24 MED ORDER — INSULIN GLARGINE 100 UNIT/ML ~~LOC~~ SOLN: 30.0000 [IU] | Freq: Two times a day (BID) | SUBCUTANEOUS | Status: DC

## 2023-05-24 MED ORDER — INSULIN GLARGINE-YFGN 100 UNIT/ML ~~LOC~~ SOLN
30.0000 [IU] | Freq: Two times a day (BID) | SUBCUTANEOUS | Status: DC
  Administered 2023-05-24: 30 [IU] via SUBCUTANEOUS
  Filled 2023-05-24: qty 0.3

## 2023-05-24 MED ORDER — INSULIN ASPART 100 UNIT/ML ~~LOC~~ SOLN: 0.0000 [IU] | Freq: Three times a day (TID) | SUBCUTANEOUS | Status: DC | PRN

## 2023-05-24 MED ORDER — ENSURE ENLIVE PO LIQD: 237.0000 mL | Freq: Three times a day (TID) | ORAL | 0 refills | Status: DC

## 2023-05-24 MED ORDER — SODIUM CHLORIDE 0.9% FLUSH: 5.0000 mL | Freq: Three times a day (TID) | INTRAVENOUS | 0 refills | Status: AC

## 2023-05-24 NOTE — Discharge Summary (Signed)
Physician Discharge Summary   Patient: Mathew Baker MRN: [id number] DOB: November 01, 1940  Admit date:     05/19/2023  Discharge date: 05/24/23  Discharge Physician: Alford Highland   PCP: Lauro Regulus, MD   Recommendations at discharge:   Follow-up PCP 5 days Follow-up Dr. Everlene Farrier general surgery 2 weeks Follow-up with interventional radiology for cholecystectomy drain  Discharge Diagnoses: Principal Problem:   Acute cholecystitis Active Problems:   Acute hypoxic respiratory failure (HCC)   Lobar pneumonia (HCC)   Uncontrolled type 2 diabetes mellitus with hyperglycemia, with long-term current use of insulin (HCC)   AKI (acute kidney injury) (HCC)   Obesity (BMI 30-39.9)   Hypokalemia   Thrush   History of stroke   Hospital Course: 82 year old man with history of COPD, hypertension, type 2 diabetes mellitus, hyperlipidemia, stroke presents with syncopal episode and found to have acute cholecystitis on CT scan.  Percutaneous cholecystectomy tube placed by interventional radiology on 9/13.  Patient empirically on Zosyn.  9/14.  Patient on 5 L of oxygen this morning.  Has some pain in his right upper quadrant.  Some cough. 9/15.  Patient on 2 L of oxygen this morning.  Still having some right upper quadrant abdominal pain. 9/16.  Patient off oxygen this morning.  Still having right upper quadrant pain. 9/17.  Patient feeling a little bit better.  Still has some right upper quadrant pain interested in going home today.  Assessment and Plan: * Acute cholecystitis Percutaneous cholecystectomy tube placed on 9/13 by interventional radiology.  On empiric Zosyn.  Did not meet the SIRS criteria for sepsis even though lactic acid was high.  Only had leukocytosis.  White blood cell count trending better to 11.9.  Switch to Augmentin for discharge.  Patient and patient's daughter taught by nursing staff how to flush cholecystectomy drain.  Follow-up with drain clinic and Dr. Everlene Farrier surgery.   Will need cholecystectomy in the future.  Acute hypoxic respiratory failure (HCC) 3 days ago had a pulse ox of 86% on room air.  This morning on room air.  Incentive spirometer.  Continue oral Lasix.    Lobar pneumonia (HCC) On Zosyn which will be switched over to Augmentin upon discharge  History of stroke Continue Eliquis.  Thrush Oral Diflucan upon discharge for 5 more days.  Hypokalemia Replaced  Obesity (BMI 30-39.9) BMI 30.40  AKI (acute kidney injury) (HCC) Creatinine 1.57 on presentation down to 0.89.  Uncontrolled type 2 diabetes mellitus with hyperglycemia, with long-term current use of insulin (HCC) Patient on sliding scale insulin.  Increase Semglee insulin to 30 units twice daily.  Patient states he takes Lantus 50 units twice a day at home.  If sugars remain high above 250 can gradually go up 4 units every couple days until he gets up to his usual dose.         Consultants: General surgery, interventional radiology Procedures performed: Percutaneous gallbladder drain placement Disposition: Home health Diet recommendation:  Cardiac and Carb modified diet DISCHARGE MEDICATION: Allergies as of 05/24/2023   No Known Allergies      Medication List     STOP taking these medications    aspirin EC 81 MG tablet   azithromycin 250 MG tablet Commonly known as: Zithromax   benzonatate 100 MG capsule Commonly known as: Lawyer   Dulaglutide 4.5 MG/0.5ML Sopn       TAKE these medications    albuterol 108 (90 Base) MCG/ACT inhaler Commonly known as: VENTOLIN HFA Inhale 2 puffs into  the lungs every 6 (six) hours as needed for wheezing or shortness of breath.   Alcohol Wipes 70 % Pads USE FOR INSULIN INJECTIONS OR TO FINGER STICK AS DIRECTED BY YOUR MEDICAL PROVIDER   amoxicillin-clavulanate 875-125 MG tablet Commonly known as: AUGMENTIN Take 1 tablet by mouth 2 (two) times daily for 7 days.   ascorbic acid 500 MG tablet Commonly known  as: VITAMIN C Take 1 tablet (500 mg total) by mouth daily.   atorvastatin 80 MG tablet Commonly known as: LIPITOR Take 80 mg by mouth daily.   docusate sodium 100 MG capsule Commonly known as: COLACE Take 100 mg by mouth 2 (two) times daily.   Eliquis 5 MG Tabs tablet Generic drug: apixaban Take 5 mg by mouth 2 (two) times daily.   empagliflozin 25 MG Tabs tablet Commonly known as: JARDIANCE 1 tablet 1 day or 1 dose.   feeding supplement Liqd Take 237 mLs by mouth 3 (three) times daily between meals.   fluconazole 100 MG tablet Commonly known as: DIFLUCAN Take 1 tablet (100 mg total) by mouth daily. Start taking on: May 26, 2023   furosemide 20 MG tablet Commonly known as: LASIX Take 20 mg by mouth daily.   HYDROcodone-acetaminophen 5-325 MG tablet Commonly known as: NORCO/VICODIN Take 1 tablet by mouth every 8 (eight) hours as needed.   insulin aspart 100 UNIT/ML injection Commonly known as: novoLOG Inject 0-6 Units into the skin 3 (three) times daily as needed for high blood sugar.   insulin glargine 100 UNIT/ML injection Commonly known as: LANTUS Inject 0.3 mLs (30 Units total) into the skin 2 (two) times daily. What changed: how much to take   ipratropium 17 MCG/ACT inhaler Commonly known as: ATROVENT HFA Inhale 2 puffs into the lungs every 6 (six) hours.   latanoprost 0.005 % ophthalmic solution Commonly known as: XALATAN Place 1 drop into both eyes at bedtime.   lisinopril 5 MG tablet Commonly known as: ZESTRIL Take 5 mg by mouth daily.   Magnesium Oxide 420 MG Tabs Take 1 tablet by mouth daily.   pantoprazole 40 MG tablet Commonly known as: PROTONIX Take 1 tablet (40 mg total) by mouth daily. Start taking on: May 25, 2023   PEN NEEDLES 29GX1/2" 29G X Misc Use 1x a day   Precision QID Test test strip Generic drug: glucose blood Use to test blood sugar 2 times daily as instructed. Dx: E11.65   sildenafil 100 MG  tablet Commonly known as: VIAGRA Take 1 tablet by mouth as needed.   sodium chloride flush 0.9 % Soln Commonly known as: NS 5 mLs by Intracatheter route every 8 (eight) hours.   tamsulosin 0.4 MG Caps capsule Commonly known as: FLOMAX Take 0.4 mg by mouth at bedtime.   vitamin B-12 100 MCG tablet Commonly known as: CYANOCOBALAMIN Take 100 mcg by mouth daily.        Follow-up Information     [place], [place] F, MD Follow up in 2 week(s).   Specialty: General Surgery Contact information: [address] [place] [address] [phone]         Lauro Regulus, MD Follow up in 5 day(s).   Specialty: Internal Medicine Contact information: [address] [facility] [place] [address] [phone]         [facility] Follow up.   Why: call to set up appointment with Gallbladder Drain clinic (2 weeks) Contact information: [address]  [place] [address] [phone]                Discharge Exam: Filed Weights   05/21/23 0612 05/23/23 0729 05/24/23 0735  Weight: 105.2 kg 104.2 kg 104.5 kg   Physical Exam HENT:     Head: Normocephalic.     Mouth/Throat:     Comments: Thrush resolved. Eyes:     General: Lids are normal.     Conjunctiva/sclera: Conjunctivae normal.  Cardiovascular:     Rate and Rhythm: Normal rate and regular rhythm.     Heart sounds: Normal heart sounds, S1 normal and S2 normal.  Pulmonary:     Breath sounds: Examination of the right-lower field reveals decreased breath sounds. Decreased breath sounds present. No wheezing, rhonchi or rales.  Abdominal:     Palpations: Abdomen is soft.     Tenderness: There is abdominal tenderness in the right upper quadrant.  Musculoskeletal:     Right lower leg: Swelling present.     Left lower leg: Swelling present.  Skin:    General: Skin is warm.     Findings: No rash.  Neurological:     Mental Status: He is alert  and oriented to person, place, and time.      Condition at discharge: stable  The results of significant diagnostics from this hospitalization (including imaging, microbiology, ancillary and laboratory) are listed below for reference.   Imaging Studies: ECHOCARDIOGRAM COMPLETE  Result Date: 05/22/2023    ECHOCARDIOGRAM REPORT   Patient Name:   TRINTON MACFADYEN Date of Exam: 05/21/2023 Medical Rec #:  [id number]     Height:       73.0 in Accession #:    [id number]    Weight:       231.9 lb Date of Birth:  1940-09-13     BSA:          2.291 m Patient Age:    82 years      BP:           125/60 mmHg Patient Gender: M             HR:           77 bpm. Exam Location:  [facility] Procedure: 2D Echo and Intracardiac Opacification Agent Indications:     Cardiomyopathy I42.9  History:         Patient has prior history of Echocardiogram examinations, most                  recent 09/15/2020.  Sonographer:     Overton Mam RDCS, FASE Referring Phys:  [id number] BRITTON L [place] Diagnosing Phys: Marcina Millard MD  Sonographer Comments: Technically difficult study due to poor echo windows, no subcostal window and suboptimal apical window. Image acquisition challenging due to respiratory motion and Image acquisition challenging due to patient body habitus. IMPRESSIONS  1. Left ventricular ejection fraction, by estimation, is 60 to 65%. The left ventricle has normal function. The left ventricle has no regional wall motion abnormalities. Left ventricular diastolic parameters are consistent with Grade I diastolic dysfunction (impaired relaxation).  2. Right ventricular systolic function is normal. The right ventricular size is normal.  3. The mitral valve is normal in structure. Mild mitral valve regurgitation. No evidence of mitral stenosis.  4. The aortic valve is normal in structure. Aortic valve regurgitation is not visualized. Mild to moderate aortic valve stenosis.  5. The inferior vena cava is normal in size with  greater than 50% respiratory variability,  suggesting right atrial pressure of 3 mmHg. FINDINGS  Left Ventricle: Left ventricular ejection fraction, by estimation, is 60 to 65%. The left ventricle has normal function. The left ventricle has no regional wall motion abnormalities. Definity contrast agent was given IV to delineate the left ventricular  endocardial borders. The left ventricular internal cavity size was normal in size. There is no left ventricular hypertrophy. Left ventricular diastolic parameters are consistent with Grade I diastolic dysfunction (impaired relaxation). Right Ventricle: The right ventricular size is normal. No increase in right ventricular wall thickness. Right ventricular systolic function is normal. Left Atrium: Left atrial size was normal in size. Right Atrium: Right atrial size was normal in size. Pericardium: There is no evidence of pericardial effusion. Mitral Valve: The mitral valve is normal in structure. Mild mitral valve regurgitation. No evidence of mitral valve stenosis. Tricuspid Valve: The tricuspid valve is normal in structure. Tricuspid valve regurgitation is mild . No evidence of tricuspid stenosis. Aortic Valve: The aortic valve is normal in structure. Aortic valve regurgitation is not visualized. Mild to moderate aortic stenosis is present. Aortic valve mean gradient measures 21.7 mmHg. Aortic valve peak gradient measures 37.9 mmHg. Aortic valve area, by VTI measures 1.68 cm. Pulmonic Valve: The pulmonic valve was normal in structure. Pulmonic valve regurgitation is not visualized. No evidence of pulmonic stenosis. Aorta: The aortic root is normal in size and structure. Venous: The inferior vena cava is normal in size with greater than 50% respiratory variability, suggesting right atrial pressure of 3 mmHg. IAS/Shunts: No atrial level shunt detected by color flow Doppler.  LEFT VENTRICLE PLAX 2D LVIDd:         5.00 cm   Diastology LVIDs:         3.20 cm   LV e' medial:     4.79 cm/s LV PW:         1.20 cm   LV E/e' medial:  18.6 LV IVS:        1.30 cm   LV e' lateral:   5.33 cm/s LVOT diam:     2.10 cm   LV E/e' lateral: 16.7 LV SV:         98 LV SV Index:   43 LVOT Area:     3.46 cm  RIGHT VENTRICLE RV Basal diam:  3.70 cm RV S prime:     22.10 cm/s TAPSE (M-mode): 2.6 cm LEFT ATRIUM             Index        RIGHT ATRIUM           Index LA diam:        3.60 cm 1.57 cm/m   RA Area:     14.70 cm LA Vol (A2C):   52.0 ml 22.69 ml/m  RA Volume:   38.20 ml  16.67 ml/m LA Vol (A4C):   30.8 ml 13.44 ml/m LA Biplane Vol: 40.5 ml 17.67 ml/m  AORTIC VALVE                     PULMONIC VALVE AV Area (Vmax):    1.33 cm      PV Vmax:        1.20 m/s AV Area (Vmean):   1.45 cm      PV Peak grad:   5.8 mmHg AV Area (VTI):     1.68 cm      RVOT Peak grad: 4 mmHg AV Vmax:  307.67 cm/s AV Vmean:          214.333 cm/s AV VTI:            0.583 m AV Peak Grad:      37.9 mmHg AV Mean Grad:      21.7 mmHg LVOT Vmax:         118.00 cm/s LVOT Vmean:        89.500 cm/s LVOT VTI:          0.282 m LVOT/AV VTI ratio: 0.48  AORTA Ao Root diam: 3.90 cm MITRAL VALVE MV Area (PHT): 2.58 cm     SHUNTS MV Decel Time: 294 msec     Systemic VTI:  0.28 m MV E velocity: 89.10 cm/s   Systemic Diam: 2.10 cm MV A velocity: 102.00 cm/s MV E/A ratio:  0.87 Marcina Millard MD Electronically signed by Marcina Millard MD Signature Date/Time: 05/22/2023/11:33:09 AM    Final    IR Perc Cholecystostomy  Result Date: 05/20/2023 INDICATION: 82 year old male with sepsis and acute calculus cholecystitis. He is too sick for cholecystectomy at this time. Therefore, he presents for placement of a percutaneous cholecystostomy tube. EXAM: CHOLECYSTOSTOMY MEDICATIONS: In patient currently receiving intravenous Zosyn. No additional antibiotic prophylaxis was administered. ANESTHESIA/SEDATION: Moderate (conscious) sedation was employed during this procedure. A total of Versed 1 mg and Fentanyl 50 mcg was  administered intravenously by radiology nursing under my supervision. Moderate Sedation Time: 10 minutes. The patient's level of consciousness and vital signs were monitored continuously by radiology nursing throughout the procedure under my direct supervision. FLUOROSCOPY TIME:  Radiation exposure index: 1.5 mGy reference air kerma COMPLICATIONS: None immediate. PROCEDURE: Informed written consent was obtained from the patient after a thorough discussion of the procedural risks, benefits and alternatives. All questions were addressed. Maximal Sterile Barrier Technique was utilized including caps, mask, sterile gowns, sterile gloves, sterile drape, hand hygiene and skin antiseptic. A timeout was performed prior to the initiation of the procedure. The right upper quadrant was interrogated with ultrasound. The sludge and stone distended gallbladder is successfully identified. A suitable skin entry site was selected and marked. Local anesthesia was obtained by infiltration with 1% lidocaine. A small dermatotomy was made. Under real-time ultrasound guidance, 21 gauge Accustick needle was advanced along a short transhepatic course and into the gallbladder lumen. A 0.018 wire was then coiled in the gallbladder lumen. The needle was exchanged and the transitional dilator advanced over the wire and into the gallbladder lumen. Dark black bile was aspirated and sent for culture. A gentle injection of contrast material opacifies the gallbladder lumen. An Amplatz wire was then coiled in the gallbladder lumen. The transitional dilator was removed and the tract dilated to 10 [place]. A 10 French all-purpose drainage catheter was then advanced over the wire and formed in the gallbladder lumen. Additional gentle contrast injection was performed confirming the tubes position. Images were obtained and stored for the medical record. The catheter was secured to the skin with 0 Prolene suture and connected to gravity bag drainage.  IMPRESSION: Successful placement of a 10 French transhepatic percutaneous cholecystostomy tube for acute calculus cholecystitis. Electronically Signed   By: Malachy Moan M.D.   On: 05/20/2023 15:59   CT ABDOMEN PELVIS W CONTRAST  Result Date: 05/19/2023 CLINICAL DATA:  Acute abdominal pain EXAM: CT ABDOMEN AND PELVIS WITH CONTRAST TECHNIQUE: Multidetector CT imaging of the abdomen and pelvis was performed using the standard protocol following bolus administration of intravenous contrast. RADIATION DOSE REDUCTION: This exam  was performed according to the departmental dose-optimization program which includes automated exposure control, adjustment of the mA and/or kV according to patient size and/or use of iterative reconstruction technique. CONTRAST:  80mL OMNIPAQUE IOHEXOL 300 MG/ML  SOLN COMPARISON:  CT abdomen and pelvis 05/16/2023 FINDINGS: Lower chest: There is atelectasis in the lung bases. Hepatobiliary: Indeterminate hypodense lesion in the left lobe of the liver measuring up to 2.2 cm appears unchanged. No new liver lesions are identified. Gallstones are present. The gallbladder is dilated. There is pericholecystic inflammation and fluid which is new from prior. There is no biliary ductal dilatation. Pancreas: Unremarkable. No pancreatic ductal dilatation or surrounding inflammatory changes. Spleen: Normal in size without focal abnormality. Adrenals/Urinary Tract: Adrenal glands are unremarkable. Kidneys are normal, without renal calculi, focal lesion, or hydronephrosis. Bladder is unremarkable. Stomach/Bowel: Stomach is within normal limits. Appendix appears normal. No evidence of bowel wall thickening, distention, or inflammatory changes. There are scattered colonic diverticula. Vascular/Lymphatic: Aortic atherosclerosis. No enlarged abdominal or pelvic lymph nodes. Reproductive: The prostate gland is enlarged. Other: There is a small fat containing umbilical hernia. There is trace free fluid in  the right upper quadrant. Musculoskeletal: No acute osseous findings. Mild chronic compression deformity of L1 is unchanged IMPRESSION: 1. Findings compatible with acute cholecystitis. 2. Stable indeterminate hypodense lesion in the left lobe of the liver. Recommend nonemergent evaluation with MRI. 3. Colonic diverticulosis. 4. Prostatomegaly. 5. Aortic atherosclerosis. Aortic Atherosclerosis (ICD10-I70.0). Electronically Signed   By: Darliss Cheney M.D.   On: 05/19/2023 20:08   DG Chest Port 1 View  Result Date: 05/19/2023 CLINICAL DATA:  Questionable sepsis EXAM: PORTABLE CHEST 1 VIEW COMPARISON:  Chest x-ray 05/16/2023 FINDINGS: The heart size and mediastinal contours are within normal limits. Both lungs are clear. The visualized skeletal structures are unremarkable. IMPRESSION: No active disease. Electronically Signed   By: Darliss Cheney M.D.   On: 05/19/2023 19:37   CT Angio Chest PE W and/or Wo Contrast  Result Date: 05/16/2023 CLINICAL DATA:  Pulmonary embolism suspected, high probability. Right-sided pleuritic chest pain. Episode of coughing in the middle the night with subsequent right chest and abdominal pain. EXAM: CT ANGIOGRAPHY CHEST CT ABDOMEN AND PELVIS WITH CONTRAST TECHNIQUE: Multidetector CT imaging of the chest was performed using the standard protocol during bolus administration of intravenous contrast. Multiplanar CT image reconstructions and MIPs were obtained to evaluate the vascular anatomy. Multidetector CT imaging of the abdomen and pelvis was performed using the standard protocol during bolus administration of intravenous contrast. RADIATION DOSE REDUCTION: This exam was performed according to the departmental dose-optimization program which includes automated exposure control, adjustment of the mA and/or kV according to patient size and/or use of iterative reconstruction technique. CONTRAST:  OMNIPAQUE IOHEXOL 350 MG/ML SOLN COMPARISON:  Two-view chest x-ray 05/16/2023  FINDINGS: CTA CHEST FINDINGS Cardiovascular: The heart is mildly enlarged. Coronary artery calcifications are present. Atherosclerotic calcifications are present in the distal arch and extending superiorly in the left subclavian artery without a significant stenosis relative to the more distal vessels. Pulmonary artery opacification is excellent. No focal filling defect is present to suggest pulmonary embolus. Pulmonary artery size is normal. Mediastinum/Nodes: No enlarged mediastinal, hilar, or axillary lymph nodes. Thyroid gland, trachea, and esophagus demonstrate no significant findings. Lungs/Pleura: Patchy peribronchial airspace opacities are present in the right upper lobe, anteriorly in the right middle lobe and superior segment of the right lower lobe. Mild dependent atelectasis is present in the left lung. No pleural disease is present. Musculoskeletal: Remote anterior  right-sided rib fractures are noted. No acute fractures are present. Incidental note is made of bilateral gynecomastia. Review of the MIP images confirms the above findings. CT ABDOMEN and PELVIS FINDINGS Hepatobiliary: The gallbladder is moderately distended. Dependent gallstones are present without inflammatory change. A low-density lesion superiorly within the left lobe of the liver is not clearly a simple cyst. Dilated intrahepatic biliary ducts are present anteriorly and to the left of the lesion. Is some nodularity of the liver. The caudate lobe is enlarged. Pancreas: Unremarkable. No pancreatic ductal dilatation or surrounding inflammatory changes. Spleen: Normal in size without focal abnormality. Adrenals/Urinary Tract: Adrenal glands are normal bilaterally. Kidneys are unremarkable. No stone or mass lesion is present. No obstruction is present. The ureters are within normal limits. The urinary bladder is normal. Stomach/Bowel: Stomach and duodenum are within normal limits. Small bowel is unremarkable. The terminal ileum is normal.  The appendix is visualized and normal. The ascending and transverse colon are within normal limits. The descending and sigmoid colon are normal. Vascular/Lymphatic: Atherosclerotic calcifications are present in the aorta and branch vessels. No aneurysm is present. No significant adenopathy is present. Reproductive: The prostate is enlarged, measuring 6.5 cm in transverse diameter. Seminal vesicles are unremarkable. Other: No abdominal wall hernia or abnormality. No abdominopelvic ascites. Musculoskeletal: The vertebral body heights and alignment are normal. A remote superior endplate L1 fracture is present. Straightening of the normal lumbar lordosis is present. Bony pelvis is within normal limits. Mild degenerative changes are present at both hips. Review of the MIP images confirms the above findings. IMPRESSION: 1. No pulmonary embolus. 2. Patchy peribronchial airspace opacities in the right upper lobe, anteriorly in the right middle lobe and superior segment of the right lower lobe most likely represents inflammation or early infection. 3. Cardiomegaly without failure. 4. Coronary artery disease. 5. Cholelithiasis without evidence for cholecystitis. 6. Low-density lesion superiorly within the left lobe of the liver is not clearly a simple cyst. Dilated intrahepatic biliary ducts are present anteriorly and to the left of the lesion. Recommend non emergent MRI of the abdomen without and with contrast for further evaluation. 7. Enlarged prostate gland. 8. Remote superior endplate L1 fracture. 9.  Aortic Atherosclerosis (ICD10-I70.0). Electronically Signed   By: Marin Roberts M.D.   On: 05/16/2023 09:38   CT ABDOMEN PELVIS W CONTRAST  Result Date: 05/16/2023 CLINICAL DATA:  Pulmonary embolism suspected, high probability. Right-sided pleuritic chest pain. Episode of coughing in the middle the night with subsequent right chest and abdominal pain. EXAM: CT ANGIOGRAPHY CHEST CT ABDOMEN AND PELVIS WITH CONTRAST  TECHNIQUE: Multidetector CT imaging of the chest was performed using the standard protocol during bolus administration of intravenous contrast. Multiplanar CT image reconstructions and MIPs were obtained to evaluate the vascular anatomy. Multidetector CT imaging of the abdomen and pelvis was performed using the standard protocol during bolus administration of intravenous contrast. RADIATION DOSE REDUCTION: This exam was performed according to the departmental dose-optimization program which includes automated exposure control, adjustment of the mA and/or kV according to patient size and/or use of iterative reconstruction technique. CONTRAST:  OMNIPAQUE IOHEXOL 350 MG/ML SOLN COMPARISON:  Two-view chest x-ray 05/16/2023 FINDINGS: CTA CHEST FINDINGS Cardiovascular: The heart is mildly enlarged. Coronary artery calcifications are present. Atherosclerotic calcifications are present in the distal arch and extending superiorly in the left subclavian artery without a significant stenosis relative to the more distal vessels. Pulmonary artery opacification is excellent. No focal filling defect is present to suggest pulmonary embolus. Pulmonary artery size  is normal. Mediastinum/Nodes: No enlarged mediastinal, hilar, or axillary lymph nodes. Thyroid gland, trachea, and esophagus demonstrate no significant findings. Lungs/Pleura: Patchy peribronchial airspace opacities are present in the right upper lobe, anteriorly in the right middle lobe and superior segment of the right lower lobe. Mild dependent atelectasis is present in the left lung. No pleural disease is present. Musculoskeletal: Remote anterior right-sided rib fractures are noted. No acute fractures are present. Incidental note is made of bilateral gynecomastia. Review of the MIP images confirms the above findings. CT ABDOMEN and PELVIS FINDINGS Hepatobiliary: The gallbladder is moderately distended. Dependent gallstones are present without inflammatory change.  A low-density lesion superiorly within the left lobe of the liver is not clearly a simple cyst. Dilated intrahepatic biliary ducts are present anteriorly and to the left of the lesion. Is some nodularity of the liver. The caudate lobe is enlarged. Pancreas: Unremarkable. No pancreatic ductal dilatation or surrounding inflammatory changes. Spleen: Normal in size without focal abnormality. Adrenals/Urinary Tract: Adrenal glands are normal bilaterally. Kidneys are unremarkable. No stone or mass lesion is present. No obstruction is present. The ureters are within normal limits. The urinary bladder is normal. Stomach/Bowel: Stomach and duodenum are within normal limits. Small bowel is unremarkable. The terminal ileum is normal. The appendix is visualized and normal. The ascending and transverse colon are within normal limits. The descending and sigmoid colon are normal. Vascular/Lymphatic: Atherosclerotic calcifications are present in the aorta and branch vessels. No aneurysm is present. No significant adenopathy is present. Reproductive: The prostate is enlarged, measuring 6.5 cm in transverse diameter. Seminal vesicles are unremarkable. Other: No abdominal wall hernia or abnormality. No abdominopelvic ascites. Musculoskeletal: The vertebral body heights and alignment are normal. A remote superior endplate L1 fracture is present. Straightening of the normal lumbar lordosis is present. Bony pelvis is within normal limits. Mild degenerative changes are present at both hips. Review of the MIP images confirms the above findings. IMPRESSION: 1. No pulmonary embolus. 2. Patchy peribronchial airspace opacities in the right upper lobe, anteriorly in the right middle lobe and superior segment of the right lower lobe most likely represents inflammation or early infection. 3. Cardiomegaly without failure. 4. Coronary artery disease. 5. Cholelithiasis without evidence for cholecystitis. 6. Low-density lesion superiorly within the  left lobe of the liver is not clearly a simple cyst. Dilated intrahepatic biliary ducts are present anteriorly and to the left of the lesion. Recommend non emergent MRI of the abdomen without and with contrast for further evaluation. 7. Enlarged prostate gland. 8. Remote superior endplate L1 fracture. 9.  Aortic Atherosclerosis (ICD10-I70.0). Electronically Signed   By: Marin Roberts M.D.   On: 05/16/2023 09:38   DG Chest 2 View  Result Date: 05/16/2023 CLINICAL DATA:  Right chest pain. EXAM: CHEST - 2 VIEW COMPARISON:  PA Lat 09/30/2022 FINDINGS: There is mild cardiomegaly with no evidence of CHF. There is patchy aortic calcification with slight aortic tortuosity and stable mediastinum. There is decreased depth of inspiration today with increased linear atelectasis in the left base. No focal pneumonia is evident. No new osseous findings. Several overlying monitor wires. IMPRESSION: Decreased depth of inspiration with increased linear atelectasis in the left base. No evidence of acute chest process. Electronically Signed   By: Almira Bar M.D.   On: 05/16/2023 07:50    Microbiology: Results for orders placed or performed during the hospital encounter of 05/19/23  Blood Culture (routine x 2)     Status: None   Collection Time: 05/19/23  5:09  PM   Specimen: BLOOD  Result Value Ref Range Status   Specimen Description BLOOD BLOOD LEFT HAND  Final   Special Requests   Final    BOTTLES DRAWN AEROBIC AND ANAEROBIC Blood Culture results may not be optimal due to an inadequate volume of blood received in culture bottles   Culture   Final    NO GROWTH 5 DAYS Performed at [facility], [address] [place], [place] [address]    Report Status 05/24/2023 FINAL  Final  Blood Culture (routine x 2)     Status: None   Collection Time: 05/19/23  6:21 PM   Specimen: BLOOD  Result Value Ref Range Status   Specimen Description BLOOD RIGHT ANTECUBITAL  Final   Special Requests   Final     BOTTLES DRAWN AEROBIC AND ANAEROBIC Blood Culture adequate volume   Culture   Final    NO GROWTH 5 DAYS Performed at [facility], [address] [place], [place] [address]    Report Status 05/24/2023 FINAL  Final  SARS Coronavirus 2 by RT PCR (hospital order, performed in [facility] hospital lab) *cepheid single result test* Anterior Nasal Swab     Status: None   Collection Time: 05/19/23  6:21 PM   Specimen: Anterior Nasal Swab  Result Value Ref Range Status   SARS Coronavirus 2 by RT PCR NEGATIVE NEGATIVE Final    Comment: (NOTE) SARS-CoV-2 target nucleic acids are NOT DETECTED.  The SARS-CoV-2 RNA is generally detectable in upper and lower respiratory specimens during the acute phase of infection. The lowest concentration of SARS-CoV-2 viral copies this assay can detect is 250 copies / mL. A negative result does not preclude SARS-CoV-2 infection and should not be used as the sole basis for treatment or other patient management decisions.  A negative result may occur with improper specimen collection / handling, submission of specimen other than nasopharyngeal swab, presence of viral mutation(s) within the areas targeted by this assay, and inadequate number of viral copies (<250 copies / mL). A negative result must be combined with clinical observations, patient history, and epidemiological information.  Fact Sheet for Patients:   RoadLapTop.co.za  Fact Sheet for Healthcare Providers: http://kim-miller.com/  This test is not yet approved or  cleared by the [place] FDA and has been authorized for detection and/or diagnosis of SARS-CoV-2 by FDA under an Emergency Use Authorization (EUA).  This EUA will remain in effect (meaning this test can be used) for the duration of the COVID-19 declaration under Section 564(b)(1) of the Act, 21 U.S.C. section 360bbb-3(b)(1), unless the authorization is terminated or revoked  sooner.  Performed at [facility], [address] [place], [place] [address]   MRSA Next Gen by PCR, Nasal     Status: None   Collection Time: 05/19/23 10:44 PM   Specimen: Nasal Mucosa; Nasal Swab  Result Value Ref Range Status   MRSA by PCR Next Gen NOT DETECTED NOT DETECTED Final    Comment: (NOTE) The GeneXpert MRSA Assay (FDA approved for NASAL specimens only), is one component of a comprehensive MRSA colonization surveillance program. It is not intended to diagnose MRSA infection nor to guide or monitor treatment for MRSA infections. Test performance is not FDA approved in patients less than 72 years old. Performed at [facility], [address] [place], [place] [address]   Expectorated Sputum Assessment w Gram Stain, Rflx to Resp Cult     Status: None   Collection Time:  05/20/23 12:00 AM   Specimen: Sputum  Result Value Ref Range Status   Specimen Description SPUTUM EXPSU  Final   Special Requests NONE  Final   Sputum evaluation   Final    Sputum specimen not acceptable for testing.  Please recollect.   RESULT CALLED TO, READ BACK BY AND VERIFIED WITH: BETH BUEONO @0047  ON 05/20/23 SKL Performed at [facility], [address], [place], [place] [address]    Report Status 05/20/2023 FINAL  Final  Aerobic/Anaerobic Culture w Gram Stain (surgical/deep wound)     Status: None (Preliminary result)   Collection Time: 05/20/23 11:09 AM   Specimen: BILE  Result Value Ref Range Status   Specimen Description   Final    BILE Performed at [facility], [address], [place], [place] [address]    Special Requests   Final    NONE Performed at [facility], [address] [place], [place] [address]    Gram Stain NO WBC SEEN NO ORGANISMS SEEN   Final   Culture   Final    CULTURE REINCUBATED FOR BETTER GROWTH Performed at [facility], [address] [place], [place] [address]    Report Status PENDING  Incomplete     Labs: CBC: Recent Labs  Lab 05/19/23 1708 05/20/23 0506 05/21/23 0443 05/22/23 0302 05/23/23 0444 05/24/23 0754  WBC 23.4* 20.9* 18.0* 14.7* 13.1* 11.9*  NEUTROABS 20.4*  --   --   --   --   --   HGB 14.5 14.0 13.1 12.8* 13.2 14.1  HCT 44.4 41.4 39.5 39.0 39.7 43.0  MCV 101.4* 99.5 99.7 99.2 99.0 100.0  PLT 230 210 215 237 268 323   Basic Metabolic Panel: Recent Labs  Lab 05/20/23 0506 05/21/23 0443 05/22/23 0302 05/23/23 0444 05/24/23 0754  NA 142 137 139 134* 137  K 3.5 3.4* 3.4* 3.7 4.0  CL 107 103 105 99 103  CO2 25 24 26 25 24   GLUCOSE 125* 179* 126* 282* 327*  BUN 58* 45* 43* 32* 28*  CREATININE 1.26* 1.09 1.15 0.92 0.89  CALCIUM 8.2* 7.6* 7.7* 7.9* 8.1*  MG 3.0* 2.6* 2.6* 2.4  --   PHOS 2.8 2.9 2.1* 2.1*  --    Liver Function Tests: Recent Labs  Lab 05/19/23 1708 05/20/23 0506 05/24/23 0754  AST 78* 60* 131*  ALT 38 34 99*  ALKPHOS 115 98 253*  BILITOT 1.8* 1.7* 1.1  PROT 6.7 6.2* 6.5  ALBUMIN 3.0* 2.7* 2.2*   CBG: Recent Labs  Lab 05/23/23 0726 05/23/23 1159 05/23/23 1504 05/23/23 2051 05/24/23 0732  GLUCAP 243* 303* 306* 368* 297*    Discharge time spent: greater than 30 minutes.  Signed: Alford Highland, MD [facility] 05/24/2023

## 2023-05-24 NOTE — Discharge Instructions (Signed)
If sugars remain high (over 250) after going back to twice a day insulin can gradually increase by four units ever two days until you reach your 50 units bid

## 2023-05-24 NOTE — TOC Transition Note (Signed)
Transition of Care [facility]) - CM/SW Discharge Note   Patient Details  Name: Mathew Baker MRN: [id number] Date of Birth: 1941-07-02  Transition of Care [facility]) CM/SW Contact:  Truddie Hidden, RN Phone Number: 05/24/2023, 9:47 AM   Clinical Narrative:    Patient discharging home. Per MD patient will not require home oxygen.  HH confirmed with Barbara Cower from [facility] Attempt to reach patient's daughter, no answer. Unable to leave a message.    Final next level of care: Home w Home Health Services Barriers to Discharge: Barriers Resolved   Patient Goals and CMS Choice CMS Medicare.gov Compare Post Acute Care list provided to:: Patient Choice offered to / list presented to : Patient  Discharge Placement                         Discharge Plan and Services Additional resources added to the After Visit Summary for                              [facility] Agency: [facility]        Social Determinants of Health (SDOH) Interventions SDOH Screenings   Food Insecurity: No Food Insecurity (05/19/2023)  Housing: Low Risk  (05/19/2023)  Transportation Needs: No Transportation Needs (05/19/2023)  Utilities: Not At Risk (05/19/2023)  Tobacco Use: Medium Risk (05/19/2023)     Readmission Risk Interventions     No data to display

## 2023-05-25 LAB — AEROBIC/ANAEROBIC CULTURE W GRAM STAIN (SURGICAL/DEEP WOUND): Gram Stain: NONE SEEN

## 2023-06-13 ENCOUNTER — Ambulatory Visit (INDEPENDENT_AMBULATORY_CARE_PROVIDER_SITE_OTHER): Payer: Medicare Other | Admitting: Surgery

## 2023-06-13 ENCOUNTER — Encounter: Payer: Self-pay | Admitting: Surgery

## 2023-06-13 VITALS — BP 112/58 | HR 73 | Ht 73.0 in | Wt 230.0 lb

## 2023-06-13 DIAGNOSIS — K810 Acute cholecystitis: Secondary | ICD-10-CM | POA: Diagnosis not present

## 2023-06-13 DIAGNOSIS — C220 Liver cell carcinoma: Secondary | ICD-10-CM

## 2023-06-13 NOTE — Patient Instructions (Signed)
Referred to  Hepatobiliary Surgery at [facility]. [facility] will contact you for appointment.

## 2023-06-14 ENCOUNTER — Telehealth: Payer: Self-pay

## 2023-06-14 NOTE — Telephone Encounter (Addendum)
Call to patient to let him know about his referral appointment. Unable to leave a message. The patient is scheduled with [facility] on October 15th. His arrival time is 10:30 am. The address is [address], [address] [place], [place] [address]. He will be seeing Angelena Form, NP. Their number is [phone].

## 2023-06-15 DIAGNOSIS — C220 Liver cell carcinoma: Secondary | ICD-10-CM | POA: Insufficient documentation

## 2023-06-15 NOTE — Progress Notes (Signed)
Outpatient Surgical Follow Up  06/15/2023  Mathew Baker is an 82 y.o. male.   Chief Complaint  Patient presents with   Follow-up    HPI: 82 y.o. male seen in f/u for cholecystitis. He does have  multiple comorbidities including coronary artery disease, A-fib on anticoagulation, diabetes, COPD, HTN,  prior history of stroke.  HE  Does have limited cardiovascular reserve.  He was hospitalized approximately 3 weeks ago and a cholecystostomy tube was placed.  I once again reviewed his chart and on remote access I found that the patient in fact had a history of hepatocellular carcinoma that was treated at [facility] with radioembolization.  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  He continues to have his drain in place.  He was looking for for definitive surgery.  Past Medical History:  Diagnosis Date   Chronic airway obstruction (HCC)    Degeneration of intervertebral disc of lumbar region    Diabetes mellitus without complication (HCC)    Essential hypertension, benign    Hyperlipidemia    Hypertension    Osteoarthritis of lower extremity    Pneumonia, organism unspecified(486)    PSA elevation 07/16/2013   Normal   Stroke [facility])     Past Surgical History:  Procedure Laterality Date   CATARACT EXTRACTION W/ INTRAOCULAR LENS  IMPLANT, BILATERAL     COLONOSCOPY  2006   Normal: Repeat in 10 yrs   IR PERC CHOLECYSTOSTOMY  05/20/2023   LOOP RECORDER INSERTION N/A 02/21/2020   Procedure: LOOP RECORDER INSERTION;  Surgeon: Marcina Millard, MD;  Location: [facility] INVASIVE CV LAB;  Service: Cardiovascular;  Laterality: N/A;   LOOP RECORDER REMOVAL N/A 10/23/2020   Procedure: LOOP RECORDER REMOVAL;  Surgeon: Marcina Millard, MD;  Location: [facility] INVASIVE CV LAB;  Service: Cardiovascular;  Laterality: N/A;   TEE WITHOUT CARDIOVERSION N/A 09/15/2020   Procedure: TRANSESOPHAGEAL  ECHOCARDIOGRAM (TEE);  Surgeon: Dalia Heading, MD;  Location: [facility] ORS;  Service: Cardiovascular;  Laterality: N/A;    Family History  Problem Relation Age of Onset   Diabetes Mother     Social History:  reports that he quit smoking about 64 years ago. His smoking use included cigarettes. He has never used smokeless tobacco. He reports that he does not drink alcohol and does not use drugs.  Allergies: No Known Allergies  Medications reviewed.    ROS Full ROS performed and is otherwise negative other than what is stated in HPI   BP (!) 112/58 (BP Location: Left Arm, Patient Position: Sitting, Cuff Size: Normal)   Pulse 73   Ht 6\' 1"  (1.854 m)   Wt 230 lb (104.3 kg)   SpO2 98%   BMI 30.34 kg/m   Physical Exam CONSTITUTIONAL: Debilitated  EYES: Pupils are equal, round,, Sclera are non-icteric. EARS, NOSE, MOUTH AND THROAT: The oral mucosa is pink and moist. Hearing is intact to voice. LYMPH NODES:  Lymph nodes in the neck are normal. RESPIRATORY:  Lungs are clear. There is normal respiratory effort, with equal breath sounds bilaterally, and without pathologic use of accessory muscles. CARDIOVASCULAR: Heart is regular without murmurs, gallops, or rubs. GI: The abdomen is  soft, negative Murphy sign There are no palpable masses.  Cholecystostomy tube in place, I changed gravity bag for suction buld to improve comfort. No peritonitis.There is no hepatosplenomegaly. There are normal bowel sounds. GU: Rectal deferred.   MUSCULOSKELETAL: Normal muscle  strength and tone. No cyanosis or edema.   SKIN: Turgor is good and there are no pathologic skin lesions or ulcers. NEUROLOGIC: Motor and sensation is grossly normal. Cranial nerves are grossly intact. PSYCH:  Oriented to person, place and time. Affect is normal.   Assessment/Plan: 82 year old male with multiple comorbidities and recent episode of cholecystitis 3 weeks ago requiring cholecystostomy tube.  Today after reviewing an  updated medical record I found out that he in fact had hepatocellular carcinoma that was treated with radioembolization.  Most recent MRCP shows no evidence of active hepatocellular carcinoma but evidence of some significant gallbladder inflammation.  Given the complexity that radiation will add to the liver and the gallbladder bed I do think he is better served at a tertiary facility where he was treated for his liver cancer in a multidisciplinary approach.  We will send referral back to [facility] hepatobiliary surgery.  I had an extensive discussion with the patient and the daughter.  Although disappointed by the fact that I would not do the surgery here at [facility] they did show understanding and appreciation. I spent 40 minutes in this encounter including personally reviewing imaging studies, coordinating his care, placing orders and performing documentation.  Sterling Big, MD [facility] General Surgeon

## 2023-06-15 NOTE — Telephone Encounter (Signed)
Spoke with the patient and he is aware of his appointment.

## 2023-06-22 ENCOUNTER — Emergency Department: Payer: Medicare Other

## 2023-06-22 ENCOUNTER — Emergency Department: Payer: Medicare Other | Admitting: Radiology

## 2023-06-22 ENCOUNTER — Other Ambulatory Visit: Payer: Self-pay

## 2023-06-22 ENCOUNTER — Emergency Department
Admission: EM | Admit: 2023-06-22 | Discharge: 2023-06-22 | Disposition: A | Discharge status: Home / Self Care | Payer: Medicare Other | Attending: Emergency Medicine | Admitting: Emergency Medicine

## 2023-06-22 DIAGNOSIS — Z8505 Personal history of malignant neoplasm of liver: Secondary | ICD-10-CM | POA: Diagnosis not present

## 2023-06-22 DIAGNOSIS — Y732 Prosthetic and other implants, materials and accessory gastroenterology and urology devices associated with adverse incidents: Secondary | ICD-10-CM | POA: Diagnosis not present

## 2023-06-22 DIAGNOSIS — E119 Type 2 diabetes mellitus without complications: Secondary | ICD-10-CM | POA: Insufficient documentation

## 2023-06-22 DIAGNOSIS — R1010 Upper abdominal pain, unspecified: Secondary | ICD-10-CM | POA: Diagnosis present

## 2023-06-22 DIAGNOSIS — I10 Essential (primary) hypertension: Secondary | ICD-10-CM | POA: Insufficient documentation

## 2023-06-22 DIAGNOSIS — T85518A Breakdown (mechanical) of other gastrointestinal prosthetic devices, implants and grafts, initial encounter: Secondary | ICD-10-CM

## 2023-06-22 HISTORY — PX: IR RADIOLOGIST EVAL & MGMT: IMG5224

## 2023-06-22 LAB — CBC WITH DIFFERENTIAL/PLATELET
Abs Immature Granulocytes: 0.03 10*3/uL (ref 0.00–0.07)
Basophils Absolute: 0 10*3/uL (ref 0.0–0.1)
Basophils Relative: 0 %
Eosinophils Absolute: 0.1 10*3/uL (ref 0.0–0.5)
Eosinophils Relative: 2 %
HCT: 37 % — ABNORMAL LOW (ref 39.0–52.0)
Hemoglobin: 11.7 g/dL — ABNORMAL LOW (ref 13.0–17.0)
Immature Granulocytes: 1 %
Lymphocytes Relative: 21 %
Lymphs Abs: 1.3 10*3/uL (ref 0.7–4.0)
MCH: 32.6 pg (ref 26.0–34.0)
MCHC: 31.6 g/dL (ref 30.0–36.0)
MCV: 103.1 fL — ABNORMAL HIGH (ref 80.0–100.0)
Monocytes Absolute: 0.8 10*3/uL (ref 0.1–1.0)
Monocytes Relative: 12 %
Neutro Abs: 4 10*3/uL (ref 1.7–7.7)
Neutrophils Relative %: 64 %
Platelets: 238 10*3/uL (ref 150–400)
RBC: 3.59 MIL/uL — ABNORMAL LOW (ref 4.22–5.81)
RDW: 13.4 % (ref 11.5–15.5)
WBC: 6.2 10*3/uL (ref 4.0–10.5)
nRBC: 0 % (ref 0.0–0.2)

## 2023-06-22 LAB — COMPREHENSIVE METABOLIC PANEL
ALT: 13 U/L (ref 0–44)
AST: 29 U/L (ref 15–41)
Albumin: 3.4 g/dL — ABNORMAL LOW (ref 3.5–5.0)
Alkaline Phosphatase: 75 U/L (ref 38–126)
Anion gap: 9 (ref 5–15)
BUN: 14 mg/dL (ref 8–23)
CO2: 23 mmol/L (ref 22–32)
Calcium: 8.8 mg/dL — ABNORMAL LOW (ref 8.9–10.3)
Chloride: 107 mmol/L (ref 98–111)
Creatinine, Ser: 0.7 mg/dL (ref 0.61–1.24)
GFR, Estimated: >60 mL/min (ref >60)
Glucose, Bld: 135 mg/dL — ABNORMAL HIGH (ref 70–99)
Potassium: 4.9 mmol/L (ref 3.5–5.1)
Sodium: 139 mmol/L (ref 135–145)
Total Bilirubin: 1.5 mg/dL — ABNORMAL HIGH (ref 0.3–1.2)
Total Protein: 7.1 g/dL (ref 6.5–8.1)

## 2023-06-22 LAB — URINALYSIS, W/ REFLEX TO CULTURE (INFECTION SUSPECTED)
Bacteria, UA: NONE SEEN
Bilirubin Urine: NEGATIVE
Glucose, UA: >=500 mg/dL — AB
Hgb urine dipstick: NEGATIVE
Ketones, ur: 5 mg/dL — AB
Leukocytes,Ua: NEGATIVE
Nitrite: NEGATIVE
Protein, ur: NEGATIVE mg/dL
Specific Gravity, Urine: 1.042 — ABNORMAL HIGH (ref 1.005–1.030)
Squamous Epithelial / HPF: 0 /[HPF] (ref 0–5)
pH: 5 (ref 5.0–8.0)

## 2023-06-22 LAB — LACTIC ACID, PLASMA: Lactic Acid, Venous: 1.4 mmol/L (ref 0.5–1.9)

## 2023-06-22 LAB — LIPASE, BLOOD: Lipase: 35 U/L (ref 11–51)

## 2023-06-22 MED ORDER — LIDOCAINE HCL 1 % IJ SOLN
INTRAMUSCULAR | Status: AC
  Filled 2023-06-22: qty 20

## 2023-06-22 MED ORDER — IOHEXOL 300 MG/ML  SOLN
100.0000 mL | Freq: Once | INTRAMUSCULAR | Status: AC | PRN
  Administered 2023-06-22: 100 mL via INTRAVENOUS

## 2023-06-22 MED ORDER — DOXYCYCLINE HYCLATE 100 MG PO CAPS: 100.0000 mg | ORAL_CAPSULE | Freq: Two times a day (BID) | ORAL | 0 refills | Status: AC

## 2023-06-22 MED ORDER — SODIUM CHLORIDE 0.9 % IV BOLUS
500.0000 mL | Freq: Once | INTRAVENOUS | Status: AC
  Administered 2023-06-22: 500 mL via INTRAVENOUS

## 2023-06-22 NOTE — ED Provider Notes (Addendum)
[facility] Provider Note    Event Date/Time   First MD Initiated Contact with Patient 06/22/23 [phone]     (approximate)   History   Chief Complaint: Wound Check   HPI  Mathew Baker is a 82 y.o. male with a history of diabetes, hypertension, liver cancer, obesity who comes ED complaining of redness around his percutaneous cholecystostomy drainage tube skin site.  Also reports some pus drainage around the tubing from the skin site over the last 24 hours.  No fever chills chest pain shortness of breath.  Reviewed outside records, noting drain was placed on 05/20/2023.  He was discharged from the hospital 05/24/2023.  He is scheduled for cholecystectomy on 07/08/2023 with Dr. Clelia Croft at [facility].     Physical Exam   Triage Vital Signs: ED Triage Vitals [06/22/23 0938]  Encounter Vitals Group     BP (!) 95/54     Systolic BP Percentile      Diastolic BP Percentile      Pulse Rate 75     Resp 20     Temp 97.9 F (36.6 C)     Temp Source Oral     SpO2 97 %     Weight 215 lb (97.5 kg)     Height 6\' 1"  (1.854 m)     Head Circumference      Peak Flow      Pain Score 8     Pain Loc      Pain Education      Exclude from Growth Chart     Most recent vital signs: Vitals:   06/22/23 1230 06/22/23 1354  BP: (!) 146/62   Pulse: (!) 51   Resp: 14   Temp:  98.8 F (37.1 C)  SpO2: 99%     General: Awake, no distress.  CV:  Good peripheral perfusion.  Resp:  Normal effort.  Abd:  No distention.  Soft, diffuse upper abdominal tenderness.  Percutaneous cholecystostomy tube is in place, anchor suture intact.  There is surrounding erythema and induration with about a 1 cm radius.  No drainage from the site right now.   Other:  Trace peripheral edema bilaterally.  Moist oral mucosa.   ED Results / Procedures / Treatments   Labs (all labs ordered are listed, but only abnormal results are displayed) Labs Reviewed  COMPREHENSIVE METABOLIC PANEL -  Abnormal; Notable for the following components:      Result Value   Glucose, Bld 135 (*)    Calcium 8.8 (*)    Albumin 3.4 (*)    Total Bilirubin 1.5 (*)    All other components within normal limits  CBC WITH DIFFERENTIAL/PLATELET - Abnormal; Notable for the following components:   RBC 3.59 (*)    Hemoglobin 11.7 (*)    HCT 37.0 (*)    MCV 103.1 (*)    All other components within normal limits  URINALYSIS, W/ REFLEX TO CULTURE (INFECTION SUSPECTED) - Abnormal; Notable for the following components:   Color, Urine STRAW (*)    APPearance CLEAR (*)    Specific Gravity, Urine 1.042 (*)    Glucose, UA >=500 (*)    Ketones, ur 5 (*)    All other components within normal limits  LACTIC ACID, PLASMA  LIPASE, BLOOD     EKG    RADIOLOGY Chest x-ray interpreted by me, unremarkable.  Radiology report reviewed  CT abdomen pelvis pending   PROCEDURES:  Procedures   MEDICATIONS ORDERED IN  ED: Medications  sodium chloride 0.9 % bolus 500 mL (0 mLs Intravenous Stopped 06/22/23 1301)  iohexol (OMNIPAQUE) 300 MG/ML solution 100 mL (100 mLs Intravenous Contrast Given 06/22/23 1103)     IMPRESSION / MDM / ASSESSMENT AND PLAN / ED COURSE  I reviewed the triage vital signs and the nursing notes.  DDx: Cellulitis, intra-abdominal abscess, cholecystostomy tube dislodgment, cholecystitis  Patient's presentation is most consistent with acute presentation with potential threat to life or bodily function.  Patient presents with redness around percutaneous drain skin site.  Appears to be some mild cellulitis developing.  Nontoxic, normal vitals.  He does have abdominal tenderness, will obtain CT and labs to further evaluate. I spoke with pt's surgery office nurse coordinator, Marcelino Duster [phone]) who is aware of pt's symptoms   Clinical Course as of 06/22/23 1525  Wed Jun 22, 2023  1444 Labs unremarkable.  CT shows some free fluid in the right upper quadrant, possibly related to  localized peritonitis.  Discussed with the patient's surgeon at [facility], Dr. Sinda Du [phone]).  Will [employer] images and follow-up after he has been able to review. [PS]    Clinical Course User Index [PS] Sharman Cheek, MD    ----------------------------------------- 3:20 PM on 06/22/2023 ----------------------------------------- Dr. Sherryll Burger advises that cholecystostomy tube needs to be exchanged.  With normal vitals and labs, I have low suspicion for complicating infection at this time. Will start doxy for superficial cellulitis.  Discussed with IR who can exchange his cholecystostomy drain tube today after which he can be discharged.   FINAL CLINICAL IMPRESSION(S) / ED DIAGNOSES   Final diagnoses:  Pain of upper abdomen  Cholecystostomy tube dysfunction, initial encounter     Rx / DC Orders   ED Discharge Orders          Ordered    doxycycline (VIBRAMYCIN) 100 MG capsule  2 times daily        06/22/23 1525             Note:  This document was prepared using Dragon voice recognition software and may include unintentional dictation errors.   Sharman Cheek, MD 06/22/23 1451    Sharman Cheek, MD 06/22/23 1525    Sharman Cheek, MD 06/22/23 [phone]

## 2023-06-22 NOTE — ED Notes (Signed)
XRAYS POWER SHARED WITH [facility]

## 2023-06-22 NOTE — ED Triage Notes (Addendum)
Per daughter, patient has redness around JP drain insertion site with white drainage; had drain placed a few weeks ago.

## 2023-06-22 NOTE — ED Provider Notes (Signed)
Patient was seen by IR. They were able to flush the tube without difficulty so did not exchange the tube. Will plan on discharging to follow up with outpatient providers. Dr. Scotty Court had prescribed antibiotics for possible cellulitis.   Mathew Semen, MD 06/22/23 [phone]

## 2023-11-18 ENCOUNTER — Emergency Department

## 2023-11-18 ENCOUNTER — Encounter: Payer: Self-pay | Admitting: Emergency Medicine

## 2023-11-18 ENCOUNTER — Other Ambulatory Visit: Payer: Self-pay

## 2023-11-18 ENCOUNTER — Inpatient Hospital Stay

## 2023-11-18 ENCOUNTER — Inpatient Hospital Stay
Admission: EM | Admit: 2023-11-18 | Discharge: 2023-11-22 | DRG: 280 | Disposition: A | Discharge status: Other Acute Inpatient Hospital | Attending: Internal Medicine | Admitting: Internal Medicine

## 2023-11-18 DIAGNOSIS — E1165 Type 2 diabetes mellitus with hyperglycemia: Secondary | ICD-10-CM | POA: Diagnosis present

## 2023-11-18 DIAGNOSIS — I700 Atherosclerosis of aorta: Secondary | ICD-10-CM | POA: Diagnosis present

## 2023-11-18 DIAGNOSIS — Z7982 Long term (current) use of aspirin: Secondary | ICD-10-CM

## 2023-11-18 DIAGNOSIS — R0602 Shortness of breath: Secondary | ICD-10-CM | POA: Diagnosis present

## 2023-11-18 DIAGNOSIS — Z6831 Body mass index (BMI) 31.0-31.9, adult: Secondary | ICD-10-CM

## 2023-11-18 DIAGNOSIS — J189 Pneumonia, unspecified organism: Secondary | ICD-10-CM | POA: Diagnosis not present

## 2023-11-18 DIAGNOSIS — Z961 Presence of intraocular lens: Secondary | ICD-10-CM | POA: Diagnosis present

## 2023-11-18 DIAGNOSIS — E872 Acidosis, unspecified: Secondary | ICD-10-CM | POA: Diagnosis present

## 2023-11-18 DIAGNOSIS — Z794 Long term (current) use of insulin: Secondary | ICD-10-CM

## 2023-11-18 DIAGNOSIS — J9601 Acute respiratory failure with hypoxia: Secondary | ICD-10-CM | POA: Diagnosis present

## 2023-11-18 DIAGNOSIS — Z87891 Personal history of nicotine dependence: Secondary | ICD-10-CM

## 2023-11-18 DIAGNOSIS — Z8505 Personal history of malignant neoplasm of liver: Secondary | ICD-10-CM

## 2023-11-18 DIAGNOSIS — J449 Chronic obstructive pulmonary disease, unspecified: Secondary | ICD-10-CM

## 2023-11-18 DIAGNOSIS — I2510 Atherosclerotic heart disease of native coronary artery without angina pectoris: Secondary | ICD-10-CM | POA: Diagnosis present

## 2023-11-18 DIAGNOSIS — E785 Hyperlipidemia, unspecified: Secondary | ICD-10-CM | POA: Diagnosis present

## 2023-11-18 DIAGNOSIS — D508 Other iron deficiency anemias: Secondary | ICD-10-CM | POA: Diagnosis not present

## 2023-11-18 DIAGNOSIS — J810 Acute pulmonary edema: Secondary | ICD-10-CM

## 2023-11-18 DIAGNOSIS — J90 Pleural effusion, not elsewhere classified: Secondary | ICD-10-CM | POA: Diagnosis not present

## 2023-11-18 DIAGNOSIS — I480 Paroxysmal atrial fibrillation: Secondary | ICD-10-CM | POA: Diagnosis present

## 2023-11-18 DIAGNOSIS — N170 Acute kidney failure with tubular necrosis: Secondary | ICD-10-CM | POA: Diagnosis present

## 2023-11-18 DIAGNOSIS — Z8673 Personal history of transient ischemic attack (TIA), and cerebral infarction without residual deficits: Secondary | ICD-10-CM

## 2023-11-18 DIAGNOSIS — Z7901 Long term (current) use of anticoagulants: Secondary | ICD-10-CM | POA: Diagnosis not present

## 2023-11-18 DIAGNOSIS — M51369 Other intervertebral disc degeneration, lumbar region without mention of lumbar back pain or lower extremity pain: Secondary | ICD-10-CM | POA: Diagnosis present

## 2023-11-18 DIAGNOSIS — I959 Hypotension, unspecified: Secondary | ICD-10-CM | POA: Diagnosis present

## 2023-11-18 DIAGNOSIS — R0603 Acute respiratory distress: Secondary | ICD-10-CM | POA: Diagnosis not present

## 2023-11-18 DIAGNOSIS — I110 Hypertensive heart disease with heart failure: Secondary | ICD-10-CM | POA: Diagnosis present

## 2023-11-18 DIAGNOSIS — I214 Non-ST elevation (NSTEMI) myocardial infarction: Secondary | ICD-10-CM | POA: Diagnosis present

## 2023-11-18 DIAGNOSIS — E1169 Type 2 diabetes mellitus with other specified complication: Secondary | ICD-10-CM

## 2023-11-18 DIAGNOSIS — E663 Overweight: Secondary | ICD-10-CM

## 2023-11-18 DIAGNOSIS — Z1152 Encounter for screening for COVID-19: Secondary | ICD-10-CM

## 2023-11-18 DIAGNOSIS — D509 Iron deficiency anemia, unspecified: Secondary | ICD-10-CM | POA: Diagnosis present

## 2023-11-18 DIAGNOSIS — Z7984 Long term (current) use of oral hypoglycemic drugs: Secondary | ICD-10-CM | POA: Diagnosis not present

## 2023-11-18 DIAGNOSIS — I10 Essential (primary) hypertension: Secondary | ICD-10-CM | POA: Diagnosis not present

## 2023-11-18 DIAGNOSIS — E119 Type 2 diabetes mellitus without complications: Secondary | ICD-10-CM | POA: Diagnosis not present

## 2023-11-18 DIAGNOSIS — I350 Nonrheumatic aortic (valve) stenosis: Secondary | ICD-10-CM | POA: Diagnosis present

## 2023-11-18 DIAGNOSIS — I4891 Unspecified atrial fibrillation: Secondary | ICD-10-CM | POA: Diagnosis not present

## 2023-11-18 DIAGNOSIS — J439 Emphysema, unspecified: Secondary | ICD-10-CM | POA: Diagnosis present

## 2023-11-18 DIAGNOSIS — R7989 Other specified abnormal findings of blood chemistry: Secondary | ICD-10-CM | POA: Diagnosis not present

## 2023-11-18 DIAGNOSIS — A419 Sepsis, unspecified organism: Secondary | ICD-10-CM | POA: Diagnosis not present

## 2023-11-18 DIAGNOSIS — Z9842 Cataract extraction status, left eye: Secondary | ICD-10-CM

## 2023-11-18 DIAGNOSIS — I5023 Acute on chronic systolic (congestive) heart failure: Secondary | ICD-10-CM | POA: Diagnosis present

## 2023-11-18 DIAGNOSIS — Z9049 Acquired absence of other specified parts of digestive tract: Secondary | ICD-10-CM

## 2023-11-18 DIAGNOSIS — Z833 Family history of diabetes mellitus: Secondary | ICD-10-CM

## 2023-11-18 DIAGNOSIS — E669 Obesity, unspecified: Secondary | ICD-10-CM | POA: Diagnosis present

## 2023-11-18 DIAGNOSIS — J9621 Acute and chronic respiratory failure with hypoxia: Secondary | ICD-10-CM | POA: Diagnosis present

## 2023-11-18 DIAGNOSIS — J441 Chronic obstructive pulmonary disease with (acute) exacerbation: Secondary | ICD-10-CM | POA: Diagnosis present

## 2023-11-18 DIAGNOSIS — Z79899 Other long term (current) drug therapy: Secondary | ICD-10-CM

## 2023-11-18 DIAGNOSIS — Z9841 Cataract extraction status, right eye: Secondary | ICD-10-CM

## 2023-11-18 DIAGNOSIS — Z8701 Personal history of pneumonia (recurrent): Secondary | ICD-10-CM

## 2023-11-18 LAB — RESP PANEL BY RT-PCR (RSV, FLU A&B, COVID)  RVPGX2
Influenza A by PCR: NEGATIVE
Influenza B by PCR: NEGATIVE
Resp Syncytial Virus by PCR: NEGATIVE
SARS Coronavirus 2 by RT PCR: NEGATIVE

## 2023-11-18 LAB — CBC WITH DIFFERENTIAL/PLATELET
Abs Immature Granulocytes: 0.07 10*3/uL (ref 0.00–0.07)
Basophils Absolute: 0 10*3/uL (ref 0.0–0.1)
Basophils Relative: 0 %
Eosinophils Absolute: 0 10*3/uL (ref 0.0–0.5)
Eosinophils Relative: 0 %
HCT: 32.3 % — ABNORMAL LOW (ref 39.0–52.0)
Hemoglobin: 9.9 g/dL — ABNORMAL LOW (ref 13.0–17.0)
Immature Granulocytes: 1 %
Lymphocytes Relative: 9 %
Lymphs Abs: 1 10*3/uL (ref 0.7–4.0)
MCH: 29.6 pg (ref 26.0–34.0)
MCHC: 30.7 g/dL (ref 30.0–36.0)
MCV: 96.4 fL (ref 80.0–100.0)
Monocytes Absolute: 0.5 10*3/uL (ref 0.1–1.0)
Monocytes Relative: 4 %
Neutro Abs: 9.4 10*3/uL — ABNORMAL HIGH (ref 1.7–7.7)
Neutrophils Relative %: 86 %
Platelets: 221 10*3/uL (ref 150–400)
RBC: 3.35 MIL/uL — ABNORMAL LOW (ref 4.22–5.81)
RDW: 16.3 % — ABNORMAL HIGH (ref 11.5–15.5)
WBC: 11 10*3/uL — ABNORMAL HIGH (ref 4.0–10.5)
nRBC: 0 % (ref 0.0–0.2)

## 2023-11-18 LAB — COMPREHENSIVE METABOLIC PANEL
ALT: 16 U/L (ref 0–44)
AST: 26 U/L (ref 15–41)
Albumin: 3.2 g/dL — ABNORMAL LOW (ref 3.5–5.0)
Alkaline Phosphatase: 75 U/L (ref 38–126)
Anion gap: 11 (ref 5–15)
BUN: 17 mg/dL (ref 8–23)
CO2: 17 mmol/L — ABNORMAL LOW (ref 22–32)
Calcium: 8.3 mg/dL — ABNORMAL LOW (ref 8.9–10.3)
Chloride: 108 mmol/L (ref 98–111)
Creatinine, Ser: 0.95 mg/dL (ref 0.61–1.24)
GFR, Estimated: >60 mL/min (ref >60)
Glucose, Bld: 345 mg/dL — ABNORMAL HIGH (ref 70–99)
Potassium: 3.9 mmol/L (ref 3.5–5.1)
Sodium: 136 mmol/L (ref 135–145)
Total Bilirubin: 0.9 mg/dL (ref 0.0–1.2)
Total Protein: 6.2 g/dL — ABNORMAL LOW (ref 6.5–8.1)

## 2023-11-18 LAB — APTT: aPTT: 28 s (ref 24–36)

## 2023-11-18 LAB — BRAIN NATRIURETIC PEPTIDE: B Natriuretic Peptide: 322.9 pg/mL — ABNORMAL HIGH (ref 0.0–100.0)

## 2023-11-18 LAB — TROPONIN I (HIGH SENSITIVITY): Troponin I (High Sensitivity): 110 ng/L (ref <18)

## 2023-11-18 LAB — PROTIME-INR
INR: 1.2 (ref 0.8–1.2)
Prothrombin Time: 15.7 s — ABNORMAL HIGH (ref 11.4–15.2)

## 2023-11-18 LAB — LACTIC ACID, PLASMA: Lactic Acid, Venous: 5.9 mmol/L (ref 0.5–1.9)

## 2023-11-18 MED ORDER — IPRATROPIUM-ALBUTEROL 0.5-2.5 (3) MG/3ML IN SOLN
3.0000 mL | Freq: Four times a day (QID) | RESPIRATORY_TRACT | Status: DC | PRN
  Administered 2023-11-19: 3 mL via RESPIRATORY_TRACT

## 2023-11-18 MED ORDER — LACTATED RINGERS IV SOLN: INTRAVENOUS | Status: DC

## 2023-11-18 MED ORDER — INSULIN ASPART 100 UNIT/ML IJ SOLN
0.0000 [IU] | INTRAMUSCULAR | Status: DC
  Administered 2023-11-19: 3 [IU] via SUBCUTANEOUS
  Administered 2023-11-19: 7 [IU] via SUBCUTANEOUS
  Administered 2023-11-19: 4 [IU] via SUBCUTANEOUS
  Administered 2023-11-19: 7 [IU] via SUBCUTANEOUS
  Administered 2023-11-19: 11 [IU] via SUBCUTANEOUS
  Administered 2023-11-19: 20 [IU] via SUBCUTANEOUS
  Administered 2023-11-20: 4 [IU] via SUBCUTANEOUS
  Filled 2023-11-18 (×7): qty 1

## 2023-11-18 MED ORDER — LACTATED RINGERS IV BOLUS: 1000.0000 mL | Freq: Once | INTRAVENOUS | Status: DC

## 2023-11-18 MED ORDER — LACTATED RINGERS IV BOLUS (SEPSIS)
1000.0000 mL | Freq: Once | INTRAVENOUS | Status: DC
  Administered 2023-11-18: 1000 mL via INTRAVENOUS

## 2023-11-18 MED ORDER — METHYLPREDNISOLONE SODIUM SUCC 125 MG IJ SOLR
125.0000 mg | Freq: Once | INTRAMUSCULAR | Status: AC
  Administered 2023-11-18: 125 mg via INTRAVENOUS
  Filled 2023-11-18: qty 2

## 2023-11-18 MED ORDER — DOCUSATE SODIUM 100 MG PO CAPS: 100.0000 mg | ORAL_CAPSULE | Freq: Two times a day (BID) | ORAL | Status: DC | PRN

## 2023-11-18 MED ORDER — IOHEXOL 300 MG/ML  SOLN
100.0000 mL | Freq: Once | INTRAMUSCULAR | Status: AC | PRN
  Administered 2023-11-18: 100 mL via INTRAVENOUS

## 2023-11-18 MED ORDER — SODIUM CHLORIDE 0.9 % IV SOLN
500.0000 mg | Freq: Once | INTRAVENOUS | Status: AC
  Administered 2023-11-18: 500 mg via INTRAVENOUS
  Filled 2023-11-18: qty 5

## 2023-11-18 MED ORDER — POLYETHYLENE GLYCOL 3350 17 G PO PACK: 17.0000 g | PACK | Freq: Every day | ORAL | Status: DC | PRN

## 2023-11-18 MED ORDER — LACTATED RINGERS IV BOLUS (SEPSIS)
500.0000 mL | Freq: Once | INTRAVENOUS | Status: AC
  Administered 2023-11-18: 500 mL via INTRAVENOUS

## 2023-11-18 MED ORDER — LACTATED RINGERS IV BOLUS
1000.0000 mL | Freq: Once | INTRAVENOUS | Status: AC
  Administered 2023-11-18: 1000 mL via INTRAVENOUS

## 2023-11-18 MED ORDER — IPRATROPIUM-ALBUTEROL 0.5-2.5 (3) MG/3ML IN SOLN
3.0000 mL | Freq: Four times a day (QID) | RESPIRATORY_TRACT | Status: DC
  Administered 2023-11-19 – 2023-11-20 (×5): 3 mL via RESPIRATORY_TRACT
  Filled 2023-11-18 (×6): qty 3

## 2023-11-18 MED ORDER — SODIUM CHLORIDE 0.9 % IV SOLN
2.0000 g | Freq: Once | INTRAVENOUS | Status: AC
  Administered 2023-11-18: 2 g via INTRAVENOUS
  Filled 2023-11-18: qty 20

## 2023-11-18 MED ORDER — ALBUTEROL SULFATE (2.5 MG/3ML) 0.083% IN NEBU
10.0000 mg/h | INHALATION_SOLUTION | RESPIRATORY_TRACT | Status: AC
  Administered 2023-11-18: 10 mg/h via RESPIRATORY_TRACT
  Filled 2023-11-18: qty 3
  Filled 2023-11-18: qty 9

## 2023-11-18 MED ORDER — LACTATED RINGERS IV BOLUS (SEPSIS): 1000.0000 mL | Freq: Once | INTRAVENOUS | Status: DC

## 2023-11-18 MED ORDER — BUDESONIDE 0.25 MG/2ML IN SUSP
0.2500 mg | Freq: Two times a day (BID) | RESPIRATORY_TRACT | Status: DC
  Administered 2023-11-19 (×2): 0.25 mg via RESPIRATORY_TRACT
  Filled 2023-11-18 (×2): qty 2

## 2023-11-18 NOTE — Sepsis Progress Note (Signed)
 Following for sepsis monitoring ?

## 2023-11-18 NOTE — H&P (Signed)
 NAME:  Mathew Baker, MRN:  [id number], DOB:  10-29-1940, LOS: 0 ADMISSION DATE:  11/18/2023, CONSULTATION DATE:  11/18/23 REFERRING MD:  Donna Bernard, CHIEF COMPLAINT:  shortness of breath    HPI  83 y.o male with significant PMH of COPD, T2DM, CVA, hyperlipidemia, hypertension, cholecystitis s/p cholecystectomy and hepatocellular carcinoma treated with Y90 who presented to the ED with chief complaints of progressive shortness of breath x 3 days.  Patient reports symptoms worsened today with associated productive cough and generalized weakness so he called EMS.  On EMS arrival patient was hypoxic with sats in the low 80s on room air.  He was treated with DuoNebs,, continuous albuterol nebulizer, magnesium and 125mg  of Solu-Medrol IV.   ED Course: Initial vital signs showed Blood pressure (!) 89/52, pulse 97, temperature 98.3 F (36.8 C), temperature source Oral, resp. rate (!) 21,  and the oxygen saturation 90% on 6L [place]. He was working hard to breath so was placed on BiPAP support.  Pertinent Labs/Diagnostics Findings: Glucose: 345. CO2 17 WBC:11.0 K/L Hgb/Hct: 9.9/32.3 PCT: negative <0.10  Lactic acid:5.9 COVID PCR: Negative,  troponin: 110 BNP: 322.9  HQI:ONGEXBMWU edema. Question left base atelectasis  Patient given 30 cc/kg of fluids and started on broad-spectrum antibiotics Ceftriaxone and Azithromycin for suspected sepsis due to pneumonia. Due to high risk for decompensation, PCCM consulted for ICU admission.  Past Medical History  COPD, T2DM, CVA, hyperlipidemia, hypertension, cholecystitis s/p cholecystectomy and  hepatocellular carcinoma treated with Y90  Significant Hospital Events   3/14: Admit with acute hypoxic respiratory failure in setting of AECOPD, questionable pneumonia and bilateral pleural effusion  Consults:  PCCM  Procedures:  none  Significant Diagnostic Tests:  3/14: Chest Xray> IMPRESSION: 1. Pulmonary edema. 2. Question left base  atelectasis. Recommend further evaluation with PA and lateral view of the chest. 3.  Aortic Atherosclerosis (ICD10-I70.0).  3/14: CT Chest, abdomen and pelvis> MPRESSION: Small to moderate bilateral pleural effusions. Bilateral lower lobe airspace opacities could reflect atelectasis or pneumonia. Diffuse coronary artery disease.  Aortic atherosclerosis. Borderline heart size. Scattered ground-glass opacities in the lungs could reflect atelectasis or early edema. No acute findings in the abdomen or pelvis.    Interim History / Subjective:      Micro Data:  3/14: SARS-CoV-2 PCR> negative 3/14: Influenza PCR> negative 3/14: Blood culture x2> 3/14: MRSA PCR>>  3/14: Strep pneumo urinary antigen> 3/14: Legionella urinary antigen>  Antimicrobials:  Azithromycin 3/14> Ceftriaxone 3/14>  OBJECTIVE  Blood pressure (!) 89/52, pulse 97, temperature 98.3 F (36.8 C), temperature source Oral, resp. rate (!) 21, height 6\' 1"  (1.854 m), weight 102.1 kg, SpO2 90%.      Intake/Output Summary (Last 24 hours) at 11/18/2023 2345 Last data filed at 11/18/2023 2321 Gross per 24 hour  Intake 766.67 ml  Output --  Net 766.67 ml   Filed Weights   11/18/23 2122  Weight: 102.1 kg   Physical Examination  GENERAL: 83 year-old critically ill patient lying in the bed on BiPAP EYES: PEERLA. No scleral icterus. Extraocular muscles intact.  HEENT: Head atraumatic, normocephalic. Oropharynx and nasopharynx clear.  NECK:  No JVD, supple  LUNGS: Normal breath sounds bilaterally.  MILD use of accessory muscles of respiration.  CARDIOVASCULAR: S1, S2 normal. No murmurs, rubs, or gallops.  ABDOMEN: Soft, NTND EXTREMITIES: trace edema of BLE. Capillary refill < 3 seconds in all extremities. Pulses palpable distally. NEUROLOGIC: The patient is on Bipap, nods appropriately . No focal neurological deficit appreciated. Cranial nerves are  intact.  SKIN: No obvious rash, lesion, or ulcer. Warm to  touch Labs/imaging that I havepersonally reviewed  (right click and "Reselect all SmartList Selections" daily)     Labs   CBC: Recent Labs  Lab 11/18/23 2143  WBC 11.0*  NEUTROABS 9.4*  HGB 9.9*  HCT 32.3*  MCV 96.4  PLT 221    Basic Metabolic Panel: Recent Labs  Lab 11/18/23 2143  NA 136  K 3.9  CL 108  CO2 17*  GLUCOSE 345*  BUN 17  CREATININE 0.95  CALCIUM 8.3*   GFR: Estimated Creatinine Clearance: 74 mL/min (by C-G formula based on SCr of 0.95 mg/dL). Recent Labs  Lab 11/18/23 2143 11/18/23 2226  WBC 11.0*  --   LATICACIDVEN  --  5.9*    Liver Function Tests: Recent Labs  Lab 11/18/23 2143  AST 26  ALT 16  ALKPHOS 75  BILITOT 0.9  PROT 6.2*  ALBUMIN 3.2*   No results for input(s): "LIPASE", "AMYLASE" in the last 168 hours. No results for input(s): "AMMONIA" in the last 168 hours.  ABG    Component Value Date/Time   HCO3 27.9 05/19/2023 2306   ACIDBASEDEF 7.0 (H) 05/19/2023 1928   O2SAT 72.3 05/19/2023 2306     Coagulation Profile: Recent Labs  Lab 11/18/23 2226  INR 1.2    Cardiac Enzymes: No results for input(s): "CKTOTAL", "CKMB", "CKMBINDEX", "TROPONINI" in the last 168 hours.  HbA1C: Hgb A1c MFr Bld  Date/Time Value Ref Range Status  05/19/2023 11:06 PM 8.7 (H) 4.8 - 5.6 % Final    Comment:    (NOTE)         Prediabetes: 5.7 - 6.4         Diabetes: >6.4         Glycemic control for adults with diabetes: <7.0   09/10/2020 04:31 AM 8.0 (H) 4.8 - 5.6 % Final    Comment:    (NOTE) Pre diabetes:          5.7%-6.4%  Diabetes:              >6.4%  Glycemic control for   <7.0% adults with diabetes     CBG: No results for input(s): "GLUCAP" in the last 168 hours.  Review of Systems:   Unable to be obtained secondary to the patient being on BiPAP  Past Medical History  He,  has a past medical history of Chronic airway obstruction (HCC), Degeneration of intervertebral disc of lumbar region, Diabetes mellitus without  complication (HCC), Essential hypertension, benign, Hyperlipidemia, Hypertension, Osteoarthritis of lower extremity, Pneumonia, organism unspecified(486), PSA elevation (07/16/2013), and Stroke (HCC).   Surgical History    Past Surgical History:  Procedure Laterality Date   CATARACT EXTRACTION W/ INTRAOCULAR LENS  IMPLANT, BILATERAL     COLONOSCOPY  2006   Normal: Repeat in 10 yrs   IR PERC CHOLECYSTOSTOMY  05/20/2023   IR RADIOLOGIST EVAL & MGMT  06/22/2023   LOOP RECORDER INSERTION N/A 02/21/2020   Procedure: LOOP RECORDER INSERTION;  Surgeon: Marcina Millard, MD;  Location: [facility] INVASIVE CV LAB;  Service: Cardiovascular;  Laterality: N/A;   LOOP RECORDER REMOVAL N/A 10/23/2020   Procedure: LOOP RECORDER REMOVAL;  Surgeon: Marcina Millard, MD;  Location: [facility] INVASIVE CV LAB;  Service: Cardiovascular;  Laterality: N/A;   TEE WITHOUT CARDIOVERSION N/A 09/15/2020   Procedure: TRANSESOPHAGEAL ECHOCARDIOGRAM (TEE);  Surgeon: Dalia Heading, MD;  Location: [facility] ORS;  Service: Cardiovascular;  Laterality: N/A;     Social History  reports that he quit smoking about 65 years ago. His smoking use included cigarettes. He has never used smokeless tobacco. He reports that he does not drink alcohol and does not use drugs.   Family History   His family history includes Diabetes in his mother.   Allergies No Known Allergies   Home Medications  Prior to Admission medications   Medication Sig Start Date End Date Taking? Authorizing Provider  albuterol (VENTOLIN HFA) 108 (90 Base) MCG/ACT inhaler Inhale 2 puffs into the lungs every 6 (six) hours as needed for wheezing or shortness of breath. 04/28/20   [provider]  Alcohol Swabs (ALCOHOL WIPES) 70 % PADS USE FOR INSULIN INJECTIONS OR TO FINGER STICK AS DIRECTED BY YOUR MEDICAL PROVIDER 04/28/20   [provider]  ascorbic acid (VITAMIN C) 500 MG tablet Take 1 tablet (500 mg total) by mouth daily. 09/17/20   Drema Dallas, MD  atorvastatin (LIPITOR) 40 MG tablet Take 40 mg by mouth at bedtime. 01/21/22   [provider]  atorvastatin (LIPITOR) 80 MG tablet Take 80 mg by mouth daily.    [provider]  docusate sodium (COLACE) 100 MG capsule Take 100 mg by mouth 2 (two) times daily. 05/02/23   [provider]  ELIQUIS 5 MG TABS tablet Take 5 mg by mouth 2 (two) times daily. 08/18/20   [provider]  empagliflozin (JARDIANCE) 25 MG TABS tablet 1 tablet 1 day or 1 dose. 12/09/20   [provider]  feeding supplement (ENSURE ENLIVE / ENSURE PLUS) LIQD Take 237 mLs by mouth 3 (three) times daily between meals. 05/24/23   Alford Highland, MD  fluconazole (DIFLUCAN) 100 MG tablet Take 1 tablet (100 mg total) by mouth daily. 05/26/23   Alford Highland, MD  furosemide (LASIX) 20 MG tablet Take 20 mg by mouth daily. 10/13/20   [provider]  glucose blood (PRECISION QID TEST) test strip Use to test blood sugar 2 times daily as instructed. Dx: E11.65 08/22/15   [provider]  HYDROcodone-acetaminophen (NORCO/VICODIN) 5-325 MG tablet Take 1 tablet by mouth every 8 (eight) hours as needed. 02/26/21   [provider]  insulin aspart (NOVOLOG) 100 UNIT/ML injection Inject 0-6 Units into the skin 3 (three) times daily as needed for high blood sugar. 05/24/23   Alford Highland, MD  insulin glargine (LANTUS) 100 UNIT/ML injection Inject 0.3 mLs (30 Units total) into the skin 2 (two) times daily. 05/24/23   Alford Highland, MD  Insulin Pen Needle (PEN NEEDLES 29GX1/2") 29G X MISC Use 1x a day 08/21/14   [provider]  ipratropium (ATROVENT HFA) 17 MCG/ACT inhaler Inhale 2 puffs into the lungs every 6 (six) hours. 09/16/20   Drema Dallas, MD  latanoprost (XALATAN) 0.005 % ophthalmic solution Place 1 drop into both eyes at bedtime.    [provider]  lisinopril (ZESTRIL) 5 MG tablet Take 5 mg by mouth daily. 03/03/23   [provider]  Magnesium Oxide 420 MG TABS Take 1 tablet by mouth daily. 10/31/20   [provider]  pantoprazole (PROTONIX) 40 MG tablet Take 1 tablet (40 mg total) by mouth daily. 05/25/23   Alford Highland, MD  sildenafil (VIAGRA) 100 MG tablet Take 1 tablet by mouth as needed. 12/08/20   [provider]  tamsulosin (FLOMAX) 0.4 MG CAPS capsule Take 0.4 mg by mouth at bedtime.    [provider]  vitamin B-12 (CYANOCOBALAMIN) 100 MCG tablet Take 100  mcg by mouth daily.    [provider]  Scheduled Meds:  budesonide (PULMICORT) nebulizer solution  0.25 mg Nebulization BID   insulin aspart  0-20 Units Subcutaneous Q4H   ipratropium-albuterol  3 mL Nebulization Q6H   Continuous Infusions:  azithromycin 500 mg (11/18/23 2328)   lactated ringers 150 mL/hr at 11/18/23 2352   PRN Meds:.docusate sodium, ipratropium-albuterol, polyethylene glycol   Active Hospital Problem list   See systems below  Assessment & Plan:  #Acute  Hypoxic Respiratory Failure #Acute Exacerbation of COPD #Pneumonia #Bilateral Pleural Effusion  Background Hx of possible Asbestosis exposure, former smoker appx 20 pack year, centri lobar emphysema and COPD, Now with hypoxic respiratory failure requiring BiPAP therapy. CT chest pending   -Supplemental O2 as needed titrate to goal 88-92% -BiPAP, wean as tolerated -High risk for intubation -Follow intermittent Chest X-ray & ABG as needed -Bronchodilators and Pulmicort nebs -IV methylpred 60-125mg  q6-12 for 3 days transition to prednisone  -Antibiotics as below   #Sepsis due to Suspected Pneumonia Initial interventions/workup included: 2 L of NS/LR & Ceftriaxone/Azithromycin -F/u cultures, trend lactic/ PCT -Monitor WBC/ fever curve -Obtain MRSA nasal swab, RVP, legionella, strep urine Ag -IV antibiotics Ceftriaxone AND Azithromycin -Gentle IVF hydration as needed -Pressors PRN for MAP goal >65 -Strict I/O's  #Elevated  Troponin likely demand #Hypertension #Atrial fibrillation #HLD  -trend troponin until peaked -Hold Eliquis and start Heparin gtt for now -Hold Jardiance, Lasix, and lisinopril -Continue atorvastatin -Obtain 2D echo   #T2 Diabetes mellitus -Check Hemoglobin A1c -CBGs -Sliding scale insulin -Follow ICU hyper/hypoglycemia protocol -Hold home Meds   #Hepatocellular Carcinoma (HCC)  -Treated with Y90 in October 2023.  -Follows with [facility]     Best practice:  Diet:  NPO Pain/Anxiety/Delirium protocol (if indicated): No VAP protocol (if indicated): Not indicated DVT prophylaxis: Systemic AC GI prophylaxis: PPI Glucose control:  SSI Yes Central venous access:  N/A Arterial line:  N/A Foley:  N/A Mobility:  bed rest  PT consulted: N/A Last date of multidisciplinary goals of care discussion []  Code Status:  full code Disposition: ICU   = Goals of Care = Code Status Order: FULL  Primary Emergency Contact: Florestine Avers   Critical care time: 45 minutes        Webb Silversmith DNP, CCRN, FNP-C, AGACNP-BC Acute Care & Family Nurse Practitioner [facility] PCCM on call pager [phone]

## 2023-11-18 NOTE — ED Triage Notes (Signed)
 Pt arrived by EMS with complaints of SOB.  He has had a cough and not felt good.  Had pneumonia about a month ago.  EMS stated fire dept found him at 80% room air and EMS put on non re-breather at 10 to bring him up to 98%.  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

## 2023-11-18 NOTE — ED Provider Notes (Signed)
 [facility] Provider Note   Event Date/Time   First MD Initiated Contact with Patient 11/18/23 2116     (approximate) History  Shortness of Breath  HPI Mathew Baker is a 83 y.o. male with a stated past medical history of type 2 diabetes, hypertension, previous pneumonia and chronic airway obstruction who presents complaining of worsening shortness of breath over the last 2 days.  Patient states that he has had a productive cough and generalized malaise over this time as well.  EMS stated that he was found to be 80% on room air and received DuoNeb, continuous albuterol treatment, magnesium infusion, and 125 of Solu-Medrol prior to arrival with no improvement in patient's respiratory status. ROS: Patient currently denies any vision changes, tinnitus, difficulty speaking, facial droop, sore throat, chest pain, abdominal pain, nausea/vomiting/diarrhea, dysuria, or weakness/numbness/paresthesias in any extremity   Physical Exam  Triage Vital Signs: ED Triage Vitals  Encounter Vitals Group     BP      Systolic BP Percentile      Diastolic BP Percentile      Pulse      Resp      Temp      Temp src      SpO2      Weight      Height      Head Circumference      Peak Flow      Pain Score      Pain Loc      Pain Education      Exclude from Growth Chart    Most recent vital signs: Vitals:   11/18/23 2121 11/18/23 2140  BP: 109/61 (!) 89/52  Pulse: 98 97  Resp: (!) 21 (!) 21  Temp: 98.3 F (36.8 C)   SpO2: 94% 90%   General: Awake, oriented x4. CV:  Good peripheral perfusion.  Resp:  Increased effort.  Tachypneic.  Expiratory wheezing over bilateral lung fields Abd:  No distention.  Other:  Elderly overweight Caucasian male sitting in stretcher with nonrebreather in place in moderate respiratory distress ED Results / Procedures / Treatments  Labs (all labs ordered are listed, but only abnormal results are displayed) Labs Reviewed  COMPREHENSIVE  METABOLIC PANEL - Abnormal; Notable for the following components:      Result Value   CO2 17 (*)    Glucose, Bld 345 (*)    Calcium 8.3 (*)    Total Protein 6.2 (*)    Albumin 3.2 (*)    All other components within normal limits  BRAIN NATRIURETIC PEPTIDE - Abnormal; Notable for the following components:   B Natriuretic Peptide 322.9 (*)    All other components within normal limits  CBC WITH DIFFERENTIAL/PLATELET - Abnormal; Notable for the following components:   WBC 11.0 (*)    RBC 3.35 (*)    Hemoglobin 9.9 (*)    HCT 32.3 (*)    RDW 16.3 (*)    Neutro Abs 9.4 (*)    All other components within normal limits  LACTIC ACID, PLASMA - Abnormal; Notable for the following components:   Lactic Acid, Venous 5.9 (*)    All other components within normal limits  PROTIME-INR - Abnormal; Notable for the following components:   Prothrombin Time 15.7 (*)    All other components within normal limits  TROPONIN I (HIGH SENSITIVITY) - Abnormal; Notable for the following components:   Troponin I (High Sensitivity) 110 (*)    All other components within  normal limits  RESP PANEL BY RT-PCR (RSV, FLU A&B, COVID)  RVPGX2  CULTURE, BLOOD (ROUTINE X 2)  CULTURE, BLOOD (ROUTINE X 2)  APTT  LACTIC ACID, PLASMA  D-DIMER, QUANTITATIVE  LEGIONELLA PNEUMOPHILA SEROGP 1 UR AG  STREP PNEUMONIAE URINARY ANTIGEN  PHOSPHORUS  MAGNESIUM  BASIC METABOLIC PANEL  CBC  HEMOGLOBIN A1C  PROCALCITONIN  TROPONIN I (HIGH SENSITIVITY)   EKG ED ECG REPORT I, Merwyn Katos, the attending physician, personally viewed and interpreted this ECG. Date: 11/18/2023 EKG Time: 2139 Rate: 94 Rhythm: normal sinus rhythm QRS Axis: normal Intervals: Atypical RBBB ST/T Wave abnormalities: normal Narrative Interpretation: Normal sinus rhythm with atypical right bundle branch block.  No evidence of acute ischemia RADIOLOGY ED MD interpretation: Chest x-ray independently interpreted and shows pulmonary edema with  questionable left base atelectasis versus infection -Agree with radiology assessment Official radiology report(s): DG Chest Port 1 View Result Date: 11/18/2023 CLINICAL DATA:  SHOB, hx of PNA EXAM: PORTABLE CHEST 1 VIEW COMPARISON:  Chest x-ray 06/22/2023 FINDINGS: The heart and mediastinal contours are unchanged. Atherosclerotic plaque. Prominent hilar vasculature. Question left base atelectasis. Increased interstitial markings. No pleural effusion. No pneumothorax. No acute osseous abnormality. IMPRESSION: 1. Pulmonary edema. 2. Question left base atelectasis. Recommend further evaluation with PA and lateral view of the chest. 3.  Aortic Atherosclerosis (ICD10-I70.0). Electronically Signed   By: Tish Frederickson M.D.   On: 11/18/2023 22:25   PROCEDURES: Critical Care performed: Yes, see critical care procedure note(s) .1-3 Lead EKG Interpretation  Performed by: Merwyn Katos, MD Authorized by: Merwyn Katos, MD     Interpretation: abnormal     ECG rate:  111   ECG rate assessment: tachycardic     Rhythm: sinus tachycardia     Ectopy: none     Conduction: normal   CRITICAL CARE Performed by: Merwyn Katos  Total critical care time: 41 minutes  Critical care time was exclusive of separately billable procedures and treating other patients.  Critical care was necessary to treat or prevent imminent or life-threatening deterioration.  Critical care was time spent personally by me on the following activities: development of treatment plan with patient and/or surrogate as well as nursing, discussions with consultants, evaluation of patient's response to treatment, examination of patient, obtaining history from patient or surrogate, ordering and performing treatments and interventions, ordering and review of laboratory studies, ordering and review of radiographic studies, pulse oximetry and re-evaluation of patient's condition.  MEDICATIONS ORDERED IN ED: Medications  albuterol  (PROVENTIL) (2.5 MG/3ML) 0.083% nebulizer solution (0 mg/hr Nebulization Stopped 11/18/23 2259)  lactated ringers infusion (has no administration in time range)  azithromycin (ZITHROMAX) 500 mg in sodium chloride 0.9 % 250 mL IVPB (has no administration in time range)  docusate sodium (COLACE) capsule 100 mg (has no administration in time range)  polyethylene glycol (MIRALAX / GLYCOLAX) packet 17 g (has no administration in time range)  ipratropium-albuterol (DUONEB) 0.5-2.5 (3) MG/3ML nebulizer solution 3 mL (has no administration in time range)  ipratropium-albuterol (DUONEB) 0.5-2.5 (3) MG/3ML nebulizer solution 3 mL (has no administration in time range)  insulin aspart (novoLOG) injection 0-20 Units (has no administration in time range)  lactated ringers bolus 1,000 mL (1,000 mLs Intravenous New Bag/Given 11/18/23 2216)  lactated ringers bolus 500 mL (0 mLs Intravenous Stopped 11/18/23 2259)  cefTRIAXone (ROCEPHIN) 2 g in sodium chloride 0.9 % 100 mL IVPB (2 g Intravenous New Bag/Given 11/18/23 2232)   IMPRESSION / MDM / ASSESSMENT AND PLAN /  ED COURSE  I reviewed the triage vital signs and the nursing notes.                             The patient is on the cardiac monitor to evaluate for evidence of arrhythmia and/or significant heart rate changes. Patient's presentation is most consistent with acute presentation with potential threat to life or bodily function. The Pt presents with hypoxia and cough highly concerning for sepsis (suspected pulmonary source). At this time, the Pt is satting well on 6 L, hypotensive, and appears hemodynamically unstable.  Will start empiric antibiotics and fluids.  Due to hypotension, will administer fluids gradually with frequent reassessment. Have low suspicion for a GI, skin/soft tissue, or CNS source at this time, but will reconsider if initial workup is unremarkable.  - CBC, BMP, LFTs - VBG - UA - BCx x2, Lactate - EKG - CXR showing pulmonary edema -  Empiric Abx: Rocephin and azithromycin - Fluids: 30cc/kg held after 1 L secondary to pulmonary edema Given patient's respiratory status not improving with nasal cannula and previous DuoNebs, patient will be started on BiPAP.  Given patient has persistent hypotension and is unable to receive further fluids, patient will possibly need vasopressor medications to maintain adequate blood pressure. Given poor trajectory of patient's respiratory distress, I have spoken to Webb Silversmith and critical care who will be assessing the patient for admission. Dispo: Admit to ICU   FINAL CLINICAL IMPRESSION(S) / ED DIAGNOSES   Final diagnoses:  Sepsis with acute hypoxic respiratory failure and septic shock, due to unspecified organism [facility])   Rx / DC Orders   ED Discharge Orders     None      Note:  This document was prepared using Dragon voice recognition software and may include unintentional dictation errors.   Merwyn Katos, MD 11/18/23 [id number]

## 2023-11-19 ENCOUNTER — Inpatient Hospital Stay

## 2023-11-19 ENCOUNTER — Inpatient Hospital Stay (HOSPITAL_COMMUNITY)
Admit: 2023-11-19 | Discharge: 2023-11-19 | Disposition: A | Discharge status: Home / Self Care | Attending: Nurse Practitioner | Admitting: Nurse Practitioner

## 2023-11-19 DIAGNOSIS — J441 Chronic obstructive pulmonary disease with (acute) exacerbation: Secondary | ICD-10-CM

## 2023-11-19 DIAGNOSIS — R0603 Acute respiratory distress: Secondary | ICD-10-CM

## 2023-11-19 DIAGNOSIS — J9601 Acute respiratory failure with hypoxia: Secondary | ICD-10-CM

## 2023-11-19 DIAGNOSIS — E785 Hyperlipidemia, unspecified: Secondary | ICD-10-CM

## 2023-11-19 DIAGNOSIS — J90 Pleural effusion, not elsewhere classified: Secondary | ICD-10-CM

## 2023-11-19 DIAGNOSIS — R7989 Other specified abnormal findings of blood chemistry: Secondary | ICD-10-CM

## 2023-11-19 DIAGNOSIS — I4891 Unspecified atrial fibrillation: Secondary | ICD-10-CM

## 2023-11-19 DIAGNOSIS — I214 Non-ST elevation (NSTEMI) myocardial infarction: Secondary | ICD-10-CM

## 2023-11-19 DIAGNOSIS — I10 Essential (primary) hypertension: Secondary | ICD-10-CM

## 2023-11-19 DIAGNOSIS — A419 Sepsis, unspecified organism: Secondary | ICD-10-CM

## 2023-11-19 DIAGNOSIS — J189 Pneumonia, unspecified organism: Secondary | ICD-10-CM

## 2023-11-19 DIAGNOSIS — E119 Type 2 diabetes mellitus without complications: Secondary | ICD-10-CM

## 2023-11-19 LAB — CBC
HCT: 31.6 % — ABNORMAL LOW (ref 39.0–52.0)
Hemoglobin: 9.7 g/dL — ABNORMAL LOW (ref 13.0–17.0)
MCH: 29.3 pg (ref 26.0–34.0)
MCHC: 30.7 g/dL (ref 30.0–36.0)
MCV: 95.5 fL (ref 80.0–100.0)
Platelets: 218 10*3/uL (ref 150–400)
RBC: 3.31 MIL/uL — ABNORMAL LOW (ref 4.22–5.81)
RDW: 16.6 % — ABNORMAL HIGH (ref 11.5–15.5)
WBC: 9.4 10*3/uL (ref 4.0–10.5)
nRBC: 0 % (ref 0.0–0.2)

## 2023-11-19 LAB — HEMOGLOBIN A1C
Hgb A1c MFr Bld: 9.1 % — ABNORMAL HIGH (ref 4.8–5.6)
Mean Plasma Glucose: 214.47 mg/dL

## 2023-11-19 LAB — HEPATIC FUNCTION PANEL
ALT: 27 U/L (ref 0–44)
AST: 122 U/L — ABNORMAL HIGH (ref 15–41)
Albumin: 3 g/dL — ABNORMAL LOW (ref 3.5–5.0)
Alkaline Phosphatase: 59 U/L (ref 38–126)
Bilirubin, Direct: <0.1 mg/dL (ref 0.0–0.2)
Total Bilirubin: 0.6 mg/dL (ref 0.0–1.2)
Total Protein: 6.2 g/dL — ABNORMAL LOW (ref 6.5–8.1)

## 2023-11-19 LAB — BLOOD GAS, VENOUS
Acid-Base Excess: 0.1 mmol/L (ref 0.0–2.0)
Acid-base deficit: 11.2 mmol/L — ABNORMAL HIGH (ref 0.0–2.0)
Bicarbonate: 16.4 mmol/L — ABNORMAL LOW (ref 20.0–28.0)
Bicarbonate: 23.8 mmol/L (ref 20.0–28.0)
FIO2: 40 %
O2 Saturation: 70.1 %
O2 Saturation: 97.9 %
Patient temperature: 37
Patient temperature: 37
pCO2, Ven: 42 mmHg — ABNORMAL LOW (ref 44–60)
pCO2, Ven: 35 mmHg — ABNORMAL LOW (ref 44–60)
pH, Ven: 7.2 — ABNORMAL LOW (ref 7.25–7.43)
pH, Ven: 7.44 — ABNORMAL HIGH (ref 7.25–7.43)
pO2, Ven: 46 mmHg — ABNORMAL HIGH (ref 32–45)
pO2, Ven: 78 mmHg — ABNORMAL HIGH (ref 32–45)

## 2023-11-19 LAB — PHOSPHORUS: Phosphorus: 3.9 mg/dL (ref 2.5–4.6)

## 2023-11-19 LAB — LACTIC ACID, PLASMA
Lactic Acid, Venous: 4.4 mmol/L (ref 0.5–1.9)
Lactic Acid, Venous: 6.5 mmol/L (ref 0.5–1.9)
Lactic Acid, Venous: 6.7 mmol/L (ref 0.5–1.9)

## 2023-11-19 LAB — BASIC METABOLIC PANEL
Anion gap: 14 (ref 5–15)
BUN: 22 mg/dL (ref 8–23)
CO2: 19 mmol/L — ABNORMAL LOW (ref 22–32)
Calcium: 8.3 mg/dL — ABNORMAL LOW (ref 8.9–10.3)
Chloride: 105 mmol/L (ref 98–111)
Creatinine, Ser: 1.32 mg/dL — ABNORMAL HIGH (ref 0.61–1.24)
GFR, Estimated: 54 mL/min — ABNORMAL LOW (ref >60)
Glucose, Bld: 392 mg/dL — ABNORMAL HIGH (ref 70–99)
Potassium: 3.9 mmol/L (ref 3.5–5.1)
Sodium: 138 mmol/L (ref 135–145)

## 2023-11-19 LAB — HEPARIN LEVEL (UNFRACTIONATED)
Heparin Unfractionated: 0.9 [IU]/mL — ABNORMAL HIGH (ref 0.30–0.70)
Heparin Unfractionated: >1.1 [IU]/mL — ABNORMAL HIGH (ref 0.30–0.70)

## 2023-11-19 LAB — APTT
aPTT: 102 s — ABNORMAL HIGH (ref 24–36)
aPTT: 116 s — ABNORMAL HIGH (ref 24–36)

## 2023-11-19 LAB — GLUCOSE, CAPILLARY
Glucose-Capillary: 133 mg/dL — ABNORMAL HIGH (ref 70–99)
Glucose-Capillary: 170 mg/dL — ABNORMAL HIGH (ref 70–99)
Glucose-Capillary: 211 mg/dL — ABNORMAL HIGH (ref 70–99)
Glucose-Capillary: 235 mg/dL — ABNORMAL HIGH (ref 70–99)
Glucose-Capillary: 272 mg/dL — ABNORMAL HIGH (ref 70–99)
Glucose-Capillary: 310 mg/dL — ABNORMAL HIGH (ref 70–99)
Glucose-Capillary: 358 mg/dL — ABNORMAL HIGH (ref 70–99)
Glucose-Capillary: 362 mg/dL — ABNORMAL HIGH (ref 70–99)

## 2023-11-19 LAB — MRSA NEXT GEN BY PCR, NASAL: MRSA by PCR Next Gen: NOT DETECTED

## 2023-11-19 LAB — BRAIN NATRIURETIC PEPTIDE: B Natriuretic Peptide: 917.8 pg/mL — ABNORMAL HIGH (ref 0.0–100.0)

## 2023-11-19 LAB — TROPONIN I (HIGH SENSITIVITY)
Troponin I (High Sensitivity): 546 ng/L (ref <18)
Troponin I (High Sensitivity): >24000 ng/L (ref <18)

## 2023-11-19 LAB — PROCALCITONIN: Procalcitonin: <0.1 ng/mL

## 2023-11-19 LAB — D-DIMER, QUANTITATIVE: D-Dimer, Quant: 0.56 ug{FEU}/mL — ABNORMAL HIGH (ref 0.00–0.50)

## 2023-11-19 LAB — CBG MONITORING, ED: Glucose-Capillary: 356 mg/dL — ABNORMAL HIGH (ref 70–99)

## 2023-11-19 LAB — MAGNESIUM: Magnesium: 2.2 mg/dL (ref 1.7–2.4)

## 2023-11-19 LAB — STREP PNEUMONIAE URINARY ANTIGEN: Strep Pneumo Urinary Antigen: NEGATIVE

## 2023-11-19 MED ORDER — INSULIN ASPART 100 UNIT/ML IJ SOLN
20.0000 [IU] | Freq: Once | INTRAMUSCULAR | Status: AC
  Administered 2023-11-19: 20 [IU] via SUBCUTANEOUS
  Filled 2023-11-19: qty 1

## 2023-11-19 MED ORDER — CHLORHEXIDINE GLUCONATE CLOTH 2 % EX PADS
6.0000 | MEDICATED_PAD | Freq: Every day | CUTANEOUS | Status: DC
  Administered 2023-11-19 – 2023-11-20 (×2): 6 via TOPICAL

## 2023-11-19 MED ORDER — FUROSEMIDE 10 MG/ML IJ SOLN
20.0000 mg | Freq: Once | INTRAMUSCULAR | Status: AC
  Administered 2023-11-19: 20 mg via INTRAVENOUS
  Filled 2023-11-19: qty 2

## 2023-11-19 MED ORDER — NOREPINEPHRINE 4 MG/250ML-% IV SOLN
0.0000 ug/min | INTRAVENOUS | Status: DC
  Administered 2023-11-19: 2 ug/min via INTRAVENOUS
  Filled 2023-11-19: qty 250

## 2023-11-19 MED ORDER — ASPIRIN 81 MG PO TBEC
81.0000 mg | DELAYED_RELEASE_TABLET | Freq: Every day | ORAL | Status: DC
  Administered 2023-11-19 – 2023-11-20 (×2): 81 mg via ORAL
  Filled 2023-11-19 (×2): qty 1

## 2023-11-19 MED ORDER — SODIUM CHLORIDE 0.9 % IV SOLN
1.0000 g | INTRAVENOUS | Status: DC
  Administered 2023-11-19: 1 g via INTRAVENOUS
  Filled 2023-11-19: qty 10

## 2023-11-19 MED ORDER — ORAL CARE MOUTH RINSE: 15.0000 mL | OROMUCOSAL | Status: DC | PRN

## 2023-11-19 MED ORDER — SODIUM BICARBONATE 8.4 % IV SOLN
100.0000 meq | Freq: Once | INTRAVENOUS | Status: AC
  Administered 2023-11-19: 100 meq via INTRAVENOUS
  Filled 2023-11-19: qty 50

## 2023-11-19 MED ORDER — SODIUM CHLORIDE 0.9 % IV SOLN
500.0000 mg | INTRAVENOUS | Status: DC
  Administered 2023-11-19 – 2023-11-21 (×2): 500 mg via INTRAVENOUS
  Filled 2023-11-19 (×2): qty 5

## 2023-11-19 MED ORDER — ORAL CARE MOUTH RINSE
15.0000 mL | OROMUCOSAL | Status: DC
  Administered 2023-11-19 – 2023-11-20 (×7): 15 mL via OROMUCOSAL

## 2023-11-19 MED ORDER — INSULIN ASPART 100 UNIT/ML IJ SOLN
10.0000 [IU] | Freq: Once | INTRAMUSCULAR | Status: AC
  Administered 2023-11-19: 10 [IU] via INTRAVENOUS
  Filled 2023-11-19 (×2): qty 0.1

## 2023-11-19 MED ORDER — METHYLPREDNISOLONE SODIUM SUCC 40 MG IJ SOLR: 40.0000 mg | INTRAMUSCULAR | Status: DC

## 2023-11-19 MED ORDER — INSULIN GLARGINE 100 UNIT/ML ~~LOC~~ SOLN
10.0000 [IU] | Freq: Every day | SUBCUTANEOUS | Status: DC
  Administered 2023-11-19 – 2023-11-22 (×4): 10 [IU] via SUBCUTANEOUS
  Filled 2023-11-19 (×4): qty 0.1

## 2023-11-19 MED ORDER — INSULIN GLARGINE 100 UNITS/ML SOLOSTAR PEN: 10.0000 [IU] | PEN_INJECTOR | SUBCUTANEOUS | Status: DC

## 2023-11-19 MED ORDER — LACTATED RINGERS IV BOLUS
1000.0000 mL | Freq: Once | INTRAVENOUS | Status: AC
  Administered 2023-11-19: 1000 mL via INTRAVENOUS

## 2023-11-19 MED ORDER — HEPARIN (PORCINE) 25000 UT/250ML-% IV SOLN
1500.0000 [IU]/h | INTRAVENOUS | Status: DC
  Administered 2023-11-19: 1500 [IU]/h via INTRAVENOUS
  Administered 2023-11-19: 1300 [IU]/h via INTRAVENOUS
  Administered 2023-11-21 – 2023-11-22 (×2): 1500 [IU]/h via INTRAVENOUS
  Filled 2023-11-19 (×5): qty 250

## 2023-11-19 MED ORDER — MORPHINE SULFATE (PF) 2 MG/ML IV SOLN
2.0000 mg | INTRAVENOUS | Status: DC | PRN
  Administered 2023-11-19: 2 mg via INTRAVENOUS
  Filled 2023-11-19: qty 1

## 2023-11-19 MED ORDER — ATORVASTATIN CALCIUM 80 MG PO TABS
80.0000 mg | ORAL_TABLET | Freq: Every day | ORAL | Status: DC
  Administered 2023-11-19 – 2023-11-22 (×4): 80 mg via ORAL
  Filled 2023-11-19 (×4): qty 1

## 2023-11-19 MED ORDER — PANTOPRAZOLE SODIUM 40 MG IV SOLR
40.0000 mg | INTRAVENOUS | Status: DC
  Administered 2023-11-19 – 2023-11-22 (×4): 40 mg via INTRAVENOUS
  Filled 2023-11-19 (×4): qty 10

## 2023-11-19 NOTE — Progress Notes (Signed)
 NAME:  Mathew Baker, MRN:  [id number], DOB:  1940-11-20, LOS: 1 ADMISSION DATE:  11/18/2023, CONSULTATION DATE:  11/18/23 REFERRING MD:  Donna Bernard, CHIEF COMPLAINT:  shortness of breath    HPI  83 y.o male with significant PMH of COPD, T2DM, CVA, hyperlipidemia, hypertension, cholecystitis s/p cholecystectomy and hepatocellular carcinoma treated with Y90 who presented to the ED with chief complaints of progressive shortness of breath x 3 days.  Patient reports symptoms worsened today with associated productive cough and generalized weakness so he called EMS.  On EMS arrival patient was hypoxic with sats in the low 80s on room air.  He was treated with DuoNebs,, continuous albuterol nebulizer, magnesium and 125mg  of Solu-Medrol IV.   ED Course: Initial vital signs showed Blood pressure (!) 89/52, pulse 97, temperature 98.3 F (36.8 C), temperature source Oral, resp. rate (!) 21,  and the oxygen saturation 90% on 6L [place]. He was working hard to breath so was placed on BiPAP support.  Pertinent Labs/Diagnostics Findings: Glucose: 345. CO2 17 WBC:11.0 K/L Hgb/Hct: 9.9/32.3 PCT: negative <0.10  Lactic acid:5.9 COVID PCR: Negative,  troponin: 110 BNP: 322.9  JSE:[id number] edema. Question left base atelectasis  Patient given 30 cc/kg of fluids and started on broad-spectrum antibiotics Ceftriaxone and Azithromycin for suspected sepsis due to pneumonia. Due to high risk for decompensation, PCCM consulted for ICU admission.  Past Medical History  COPD, T2DM, CVA, hyperlipidemia, hypertension, cholecystitis s/p cholecystectomy and  hepatocellular carcinoma treated with Y90  Significant Hospital Events   3/14: Admit with acute hypoxic respiratory failure in setting of AECOPD, questionable pneumonia and bilateral pleural effusion 3/15: Pt on and off Bipap due to shortness of breath.  Repeat troponin >24,000 Cardiology consulted tentative plan for cardiac cath 03/17 if stable from  respiratory standpoint   Significant Diagnostic Tests:  3/14: Chest Xray> IMPRESSION: 1. Pulmonary edema. 2. Question left base atelectasis. Recommend further evaluation with PA and lateral view of the chest. 3.  Aortic Atherosclerosis (ICD10-I70.0).  3/14: CT Chest, abdomen and pelvis> MPRESSION: Small to moderate bilateral pleural effusions. Bilateral lower lobe airspace opacities could reflect atelectasis or pneumonia. Diffuse coronary artery disease.  Aortic atherosclerosis. Borderline heart size. Scattered ground-glass opacities in the lungs could reflect atelectasis or early edema. No acute findings in the abdomen or pelvis.    Interim History / Subjective:    As outlined above under significant events   Micro Data:  3/14: SARS-CoV-2 PCR> negative 3/14: Influenza PCR> negative 3/14: Blood culture x2>>NGTD 3/14: MRSA PCR>> negative  3/14: RVP>> 3/15: Strep pneumo urinary antigen>> 3/15: Legionella urinary antigen>> 3/14: RVP>>   Anti-infectives (From admission, onward)    Start     Dose/Rate Route Frequency Ordered Stop   11/19/23 2330  azithromycin (ZITHROMAX) 500 mg in sodium chloride 0.9 % 250 mL IVPB        500 mg 250 mL/hr over 60 Minutes Intravenous Every 24 hours 11/19/23 0100     11/19/23 2230  cefTRIAXone (ROCEPHIN) 1 g in sodium chloride 0.9 % 100 mL IVPB        1 g 200 mL/hr over 30 Minutes Intravenous Every 24 hours 11/19/23 0100     11/18/23 2230  cefTRIAXone (ROCEPHIN) 2 g in sodium chloride 0.9 % 100 mL IVPB        2 g 200 mL/hr over 30 Minutes Intravenous Once 11/18/23 2221 11/18/23 2320   11/18/23 2230  azithromycin (ZITHROMAX) 500 mg in sodium chloride 0.9 % 250 mL IVPB  500 mg 250 mL/hr over 60 Minutes Intravenous  Once 11/18/23 2221 11/19/23 0056        OBJECTIVE  Blood pressure 99/64, pulse 89, temperature 98 F (36.7 C), temperature source Oral, resp. rate 20, height 6\' 1"  (1.854 m), weight 106.5 kg, SpO2 98%.  FiO2 (%):  [30  %-40 %] 30 %   Intake/Output Summary (Last 24 hours) at 11/19/2023 1024 Last data filed at 11/19/2023 0500 Gross per 24 hour  Intake 2744.95 ml  Output --  Net 2744.95 ml   Filed Weights   11/18/23 2122 11/19/23 0242  Weight: 102.1 kg 106.5 kg   Physical Examination  GENERAL: Acutely-ill appearing male, NAD on Bipap HEENT: Head atraumatic, normocephalic. Oropharynx and nasopharynx clear.  No JVD present  LUNGS: Faint rhonchi with expiratory wheezes, intermittent dyspnea  CARDIOVASCULAR: NSR, no r/g, 2+ radial/1+ distal pulses, trace bilateral lower extremity edema  ABDOMEN: +BS x4, obese soft, non distended, non tender  EXTREMITIES: Normal bulk and tone, moves all extremities  NEUROLOGIC: Alert and oriented, follows commands, PERRLA  SKIN: No obvious rash, lesion, or ulcer. Warm to touch   Labs   CBC: Recent Labs  Lab 11/18/23 2143 11/19/23 0347  WBC 11.0* 9.4  NEUTROABS 9.4*  --   HGB 9.9* 9.7*  HCT 32.3* 31.6*  MCV 96.4 95.5  PLT 221 218    Basic Metabolic Panel: Recent Labs  Lab 11/18/23 2143 11/19/23 0347  NA 136 138  K 3.9 3.9  CL 108 105  CO2 17* 19*  GLUCOSE 345* 392*  BUN 17 22  CREATININE 0.95 1.32*  CALCIUM 8.3* 8.3*  MG  --  2.2  PHOS  --  3.9   GFR: Estimated Creatinine Clearance: 54.3 mL/min (A) (by C-G formula based on SCr of 1.32 mg/dL (H)). Recent Labs  Lab 11/18/23 2143 11/18/23 2226 11/18/23 2340 11/19/23 0347 11/19/23 0836  PROCALCITON <0.10  --   --   --   --   WBC 11.0*  --   --  9.4  --   LATICACIDVEN  --  5.9* 6.7*  --  6.5*    Liver Function Tests: Recent Labs  Lab 11/18/23 2143  AST 26  ALT 16  ALKPHOS 75  BILITOT 0.9  PROT 6.2*  ALBUMIN 3.2*   No results for input(s): "LIPASE", "AMYLASE" in the last 168 hours. No results for input(s): "AMMONIA" in the last 168 hours.  ABG    Component Value Date/Time   HCO3 16.4 (L) 11/19/2023 0059   ACIDBASEDEF 11.2 (H) 11/19/2023 0059   O2SAT 70.1 11/19/2023 0059      Coagulation Profile: Recent Labs  Lab 11/18/23 2226  INR 1.2    Cardiac Enzymes: No results for input(s): "CKTOTAL", "CKMB", "CKMBINDEX", "TROPONINI" in the last 168 hours.  HbA1C: Hgb A1c MFr Bld  Date/Time Value Ref Range Status  05/19/2023 11:06 PM 8.7 (H) 4.8 - 5.6 % Final    Comment:    (NOTE)         Prediabetes: 5.7 - 6.4         Diabetes: >6.4         Glycemic control for adults with diabetes: <7.0   09/10/2020 04:31 AM 8.0 (H) 4.8 - 5.6 % Final    Comment:    (NOTE) Pre diabetes:          5.7%-6.4%  Diabetes:              >6.4%  Glycemic control for   <7.0% adults  with diabetes     CBG: Recent Labs  Lab 11/19/23 0022 11/19/23 0230 11/19/23 0405 11/19/23 0603 11/19/23 0813  GLUCAP 356* 362* 358* 310* 272*    Review of Systems: Positives in BOLD  Gen: Denies fever, chills, weight change, fatigue, night sweats HEENT: Denies blurred vision, double vision, hearing loss, tinnitus, sinus congestion, rhinorrhea, sore throat, neck stiffness, dysphagia PULM: shortness of breath, cough, sputum production, hemoptysis, wheezing CV: Denies chest pain, edema, orthopnea, paroxysmal nocturnal dyspnea, palpitations GI: Denies abdominal pain, nausea, vomiting, diarrhea, hematochezia, melena, constipation, change in bowel habits GU: Denies dysuria, hematuria, polyuria, oliguria, urethral discharge Endocrine: Denies hot or cold intolerance, polyuria, polyphagia or appetite change Derm: Denies rash, dry skin, scaling or peeling skin change Heme: Denies easy bruising, bleeding, bleeding gums Neuro: Denies headache, numbness, weakness, slurred speech, loss of memory or consciousness  Past Medical History  He,  has a past medical history of Chronic airway obstruction (HCC), Degeneration of intervertebral disc of lumbar region, Diabetes mellitus without complication (HCC), Essential hypertension, benign, Hyperlipidemia, Hypertension, Osteoarthritis of lower extremity,  Pneumonia, organism unspecified(486), PSA elevation (07/16/2013), and Stroke (HCC).   Surgical History    Past Surgical History:  Procedure Laterality Date   CATARACT EXTRACTION W/ INTRAOCULAR LENS  IMPLANT, BILATERAL     COLONOSCOPY  2006   Normal: Repeat in 10 yrs   IR PERC CHOLECYSTOSTOMY  05/20/2023   IR RADIOLOGIST EVAL & MGMT  06/22/2023   LOOP RECORDER INSERTION N/A 02/21/2020   Procedure: LOOP RECORDER INSERTION;  Surgeon: Marcina Millard, MD;  Location: [facility] INVASIVE [facility];  Service: Cardiovascular;  Laterality: N/A;   LOOP RECORDER REMOVAL N/A 10/23/2020   Procedure: LOOP RECORDER REMOVAL;  Surgeon: Marcina Millard, MD;  Location: [facility] INVASIVE CV [facility];  Service: Cardiovascular;  Laterality: N/A;   TEE WITHOUT CARDIOVERSION N/A 09/15/2020   Procedure: TRANSESOPHAGEAL ECHOCARDIOGRAM (TEE);  Surgeon: Dalia Heading, MD;  Location: [facility] ORS;  Service: Cardiovascular;  Laterality: N/A;     Social History   reports that he quit smoking about 65 years ago. His smoking use included cigarettes. He has never used smokeless tobacco. He reports that he does not drink alcohol and does not use drugs.   Family History   His family history includes Diabetes in his mother.   Allergies No Known Allergies   Home Medications  Prior to Admission medications   Medication Sig Start Date End Date Taking? Authorizing Provider  albuterol (VENTOLIN HFA) 108 (90 Base) MCG/ACT inhaler Inhale 2 puffs into the lungs every 6 (six) hours as needed for wheezing or shortness of breath. 04/28/20   [provider]  Alcohol Swabs (ALCOHOL WIPES) 70 % PADS USE FOR INSULIN INJECTIONS OR TO FINGER STICK AS DIRECTED BY YOUR MEDICAL PROVIDER 04/28/20   [provider]  ascorbic acid (VITAMIN C) 500 MG tablet Take 1 tablet (500 mg total) by mouth daily. 09/17/20   Drema Dallas, MD  atorvastatin (LIPITOR) 40 MG tablet Take 40 mg by mouth at bedtime. 01/21/22   [provider]   atorvastatin (LIPITOR) 80 MG tablet Take 80 mg by mouth daily.    [provider]  docusate sodium (COLACE) 100 MG capsule Take 100 mg by mouth 2 (two) times daily. 05/02/23   [provider]  ELIQUIS 5 MG TABS tablet Take 5 mg by mouth 2 (two) times daily. 08/18/20   [provider]  empagliflozin (JARDIANCE) 25 MG TABS tablet 1 tablet 1 day or 1 dose. 12/09/20  [provider]  feeding supplement (ENSURE ENLIVE / ENSURE PLUS) LIQD Take 237 mLs by mouth 3 (three) times daily between meals. 05/24/23   Alford Highland, MD  fluconazole (DIFLUCAN) 100 MG tablet Take 1 tablet (100 mg total) by mouth daily. 05/26/23   Alford Highland, MD  furosemide (LASIX) 20 MG tablet Take 20 mg by mouth daily. 10/13/20   [provider]  glucose blood (PRECISION QID TEST) test strip Use to test blood sugar 2 times daily as instructed. Dx: E11.65 08/22/15   [provider]  HYDROcodone-acetaminophen (NORCO/VICODIN) 5-325 MG tablet Take 1 tablet by mouth every 8 (eight) hours as needed. 02/26/21   [provider]  insulin aspart (NOVOLOG) 100 UNIT/ML injection Inject 0-6 Units into the skin 3 (three) times daily as needed for high blood sugar. 05/24/23   Alford Highland, MD  insulin glargine (LANTUS) 100 UNIT/ML injection Inject 0.3 mLs (30 Units total) into the skin 2 (two) times daily. 05/24/23   Alford Highland, MD  Insulin Pen Needle (PEN NEEDLES 29GX1/2") 29G X MISC Use 1x a day 08/21/14   [provider]  ipratropium (ATROVENT HFA) 17 MCG/ACT inhaler Inhale 2 puffs into the lungs every 6 (six) hours. 09/16/20   Drema Dallas, MD  latanoprost (XALATAN) 0.005 % ophthalmic solution Place 1 drop into both eyes at bedtime.    [provider]  lisinopril (ZESTRIL) 5 MG tablet Take 5 mg by mouth daily. 03/03/23   [provider]  Magnesium Oxide 420 MG TABS Take 1 tablet by mouth daily. 10/31/20   [provider]   pantoprazole (PROTONIX) 40 MG tablet Take 1 tablet (40 mg total) by mouth daily. 05/25/23   Alford Highland, MD  sildenafil (VIAGRA) 100 MG tablet Take 1 tablet by mouth as needed. 12/08/20   [provider]  tamsulosin (FLOMAX) 0.4 MG CAPS capsule Take 0.4 mg by mouth at bedtime.    [provider]  vitamin B-12 (CYANOCOBALAMIN) 100 MCG tablet Take 100 mcg by mouth daily.    [provider]  Scheduled Meds:  budesonide (PULMICORT) nebulizer solution  0.25 mg Nebulization BID   Chlorhexidine Gluconate Cloth  6 each Topical Daily   furosemide  20 mg Intravenous Once   insulin aspart  0-20 Units Subcutaneous Q4H   ipratropium-albuterol  3 mL Nebulization Q6H   mouth rinse  15 mL Mouth Rinse 4 times per day   pantoprazole (PROTONIX) IV  40 mg Intravenous Q24H   Continuous Infusions:  azithromycin     cefTRIAXone (ROCEPHIN)  IV     heparin 1,500 Units/hr (11/19/23 0400)   norepinephrine (LEVOPHED) Adult infusion 1 mcg/min (11/19/23 0404)   PRN Meds:.docusate sodium, ipratropium-albuterol, morphine injection, mouth rinse, polyethylene glycol   Active Hospital Problem list   See systems below  Assessment & Plan:  #Acute hypoxic respiratory failure #Acute exacerbation of COPD #Pneumonia #Bilateral pleural effusion Background 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 Bilateral lower lobe airspace opacities could reflect atelectasis or pneumonia. Diffuse coronary artery disease.  Aortic atherosclerosis. Borderline heart size. Scattered ground-glass opacities in the lungs could reflect atelectasis or early edema - Supplemental O2 or Bipap prn for dyspnea and/or hypoxia   - Maintain O2 sats 88% to 92% - Follow intermittent CXR & ABG as needed - Scheduled and prn bronchodilator therapy  - IV steroids - IV lasix x1 dose   #  NSTEMI   #Hypotension: suspect secondary to sepsis  Hx: HLD, essential HTN, stroke, and atrial fibrillation  - Continuous telemetry monitoring  - Troponin >24,000 - Cardiology consulted appreciate input: tentatively scheduled for cardiac cath 03/17 if stable from a respiratory standpoint  - Echo pending - Aspirin and atorvastatin  - Continue heparin gtt: dosing per pharmacy  - Hold outpatient antihypertensives  - Prn levophed gtt to maintain map 65 or higher   #Acute kidney injury suspect secondary to ATN  #Lactic acidosis~improving  #Non anion gap metabolic acidosis  - Trend BMP, vbg, and lactic acid  - Replace electrolytes as indicated  - Strict I&O's  - Avoid nephrotoxic agents as able    #Hepatocellular Carcinoma (HCC)  -Treated with Y90 in October 2023.  -Follows with [facility]  #Type II diabetes mellitus  - CBG's q4hrs  - SSI  - Follow hyper/hypoglycemic protocol  - Target CBG range 140 to 180   Best practice:  Diet:  NPO Pain/Anxiety/Delirium protocol (if indicated): No VAP protocol (if indicated): Not indicated DVT prophylaxis: Systemic AC GI prophylaxis: PPI Glucose control:  SSI Yes Central venous access:  N/A Arterial line:  N/A Foley:  N/A Mobility:  bed rest  PT consulted: N/A Last date of multidisciplinary goals of care discussion [11/19/2023] Code Status:  full code Disposition: ICU  03/15: Pt and pts daughter updated at bedside by ICU Intensivist Dr. Larinda Buttery regarding pts condition and current plan of care.  Critical care time: 45 minutes     Zada Girt, AGNP  Pulmonary/Critical Care Pager [phone] (please enter 7 digits) PCCM Consult Pager [phone] (please enter 7 digits)

## 2023-11-19 NOTE — Progress Notes (Signed)
 eLink Physician-Brief Progress Note Patient Name: Mathew Baker DOB: 1941/03/13 MRN: [id number]   Date of Service  11/19/2023  HPI/Events of Note  83 year old with a history of COPD, diabetes, CVA and a history of hepatocellular carcinoma presented with progressive shortness of breath for 3 days found to have acute exacerbation of COPD with suspected community-acquired pneumonia.  Normal vitals on examination saturating 96% on 30% BiPAP on low-dose norepinephrine infusion.  Results show none anion gap metabolic acidosis lactic acidosis, normocytic anemia.  Negative procalcitonin.  Small bilateral pleural effusions with interstitial markings consistent with fluid overload  eICU Interventions  Anti-infective therapy per primary team.  Maintain scheduled DuoNebs, scheduled steroids  Nor epi as needed to maintain MAP greater than 65  DVT prophylaxis and SCDs GI prophylaxis with Protonix     Intervention Category Evaluation Type: New Patient Evaluation  Mathew Baker 11/19/2023, 3:16 AM

## 2023-11-19 NOTE — Progress Notes (Signed)
*  PRELIMINARY RESULTS* Echocardiogram 2D Echocardiogram has been performed.  Mathew Baker Mathew Baker 11/19/2023, 5:59 PM

## 2023-11-19 NOTE — Plan of Care (Signed)

## 2023-11-19 NOTE — Progress Notes (Signed)
 PHARMACY - ANTICOAGULATION CONSULT NOTE  Pharmacy Consult for Heparin  Indication: atrial fibrillation  No Known Allergies  Patient Measurements: Height: 6\' 1"  (185.4 cm) Weight: 106.5 kg (234 lb 12.6 oz) IBW/kg (Calculated) : 79.9 Heparin Dosing Weight: 100.5 kg   Vital Signs: Temp: 98 F (36.7 C) (03/15 0845) Temp Source: Oral (03/15 0845) BP: 99/64 (03/15 0845) Pulse Rate: 89 (03/15 0845)  Labs: Recent Labs    11/18/23 2143 11/18/23 2226 11/18/23 2340 11/19/23 0347 11/19/23 0836  HGB 9.9*  --   --  9.7*  --   HCT 32.3*  --   --  31.6*  --   PLT 221  --   --  218  --   APTT  --  28  --   --  102*  LABPROT  --  15.7*  --   --   --   INR  --  1.2  --   --   --   HEPARINUNFRC  --  0.90*  --   --  >1.10*  CREATININE 0.95  --   --  1.32*  --   TROPONINIHS 110*  --  546*  --   --     Estimated Creatinine Clearance: 54.3 mL/min (A) (by C-G formula based on SCr of 1.32 mg/dL (H)).   Medical History: Past Medical History:  Diagnosis Date   Chronic airway obstruction (HCC)    Degeneration of intervertebral disc of lumbar region    Diabetes mellitus without complication (HCC)    Essential hypertension, benign    Hyperlipidemia    Hypertension    Osteoarthritis of lower extremity    Pneumonia, organism unspecified(486)    PSA elevation 07/16/2013   Normal   Stroke (HCC)     Medications:  Medications Prior to Admission  Medication Sig Dispense Refill Last Dose/Taking   albuterol (VENTOLIN HFA) 108 (90 Base) MCG/ACT inhaler Inhale 2 puffs into the lungs every 6 (six) hours as needed for wheezing or shortness of breath.      Alcohol Swabs (ALCOHOL WIPES) 70 % PADS USE FOR INSULIN INJECTIONS OR TO FINGER STICK AS DIRECTED BY YOUR MEDICAL PROVIDER      ascorbic acid (VITAMIN C) 500 MG tablet Take 1 tablet (500 mg total) by mouth daily. 30 tablet 0    atorvastatin (LIPITOR) 40 MG tablet Take 40 mg by mouth at bedtime.      atorvastatin (LIPITOR) 80 MG tablet Take 80  mg by mouth daily.      docusate sodium (COLACE) 100 MG capsule Take 100 mg by mouth 2 (two) times daily.      ELIQUIS 5 MG TABS tablet Take 5 mg by mouth 2 (two) times daily.      empagliflozin (JARDIANCE) 25 MG TABS tablet 1 tablet 1 day or 1 dose.      feeding supplement (ENSURE ENLIVE / ENSURE PLUS) LIQD Take 237 mLs by mouth 3 (three) times daily between meals. 21330 mL 0    fluconazole (DIFLUCAN) 100 MG tablet Take 1 tablet (100 mg total) by mouth daily. 5 tablet 0    furosemide (LASIX) 20 MG tablet Take 20 mg by mouth daily.      glucose blood (PRECISION QID TEST) test strip Use to test blood sugar 2 times daily as instructed. Dx: E11.65      HYDROcodone-acetaminophen (NORCO/VICODIN) 5-325 MG tablet Take 1 tablet by mouth every 8 (eight) hours as needed.      insulin aspart (NOVOLOG) 100 UNIT/ML injection Inject  0-6 Units into the skin 3 (three) times daily as needed for high blood sugar.      insulin glargine (LANTUS) 100 UNIT/ML injection Inject 0.3 mLs (30 Units total) into the skin 2 (two) times daily.      Insulin Pen Needle (PEN NEEDLES 29GX1/2") 29G X MISC Use 1x a day      ipratropium (ATROVENT HFA) 17 MCG/ACT inhaler Inhale 2 puffs into the lungs every 6 (six) hours. 1 each 0    latanoprost (XALATAN) 0.005 % ophthalmic solution Place 1 drop into both eyes at bedtime.      lisinopril (ZESTRIL) 5 MG tablet Take 5 mg by mouth daily.      Magnesium Oxide 420 MG TABS Take 1 tablet by mouth daily.      pantoprazole (PROTONIX) 40 MG tablet Take 1 tablet (40 mg total) by mouth daily. 30 tablet 0    sildenafil (VIAGRA) 100 MG tablet Take 1 tablet by mouth as needed.      tamsulosin (FLOMAX) 0.4 MG CAPS capsule Take 0.4 mg by mouth at bedtime.      vitamin B-12 (CYANOCOBALAMIN) 100 MCG tablet Take 100 mcg by mouth daily.       Assessment: Pharmacy consulted to dose heparin for Afib in this 83 year old male admitted with resp failure.  Pt was on Eliquis 5 mg PO BID PTA.  Last dose on  3/14 @ 0600.   Baseline HL (3/14 @ 2226) = 0.9, elevated from Eliquis CrCl = 74 ml/min   Goal of Therapy:  Heparin level 0.3-0.7 units/ml aPTT 66 - 102 seconds Monitor platelets by anticoagulation protocol: Yes  Date/Time: aPTT/HL: Comment/Rate: 3/15@0836  102/>1.1 Therapeutic@1500  units/hr  Plan:  - aPTT therapeutic x 1 - Continue heparin infusion at 1500 units/hr - Continue to monitor H&H and platelets - Will use aPTT to guide dosing until correlating with aPTT - Will will check confirmatory aPTT in 8 hrs  - CBC daily  Anuel Sitter A Anaiz Qazi 11/19/2023,9:04 AM

## 2023-11-19 NOTE — H&P (Incomplete)
 NAME:  Mathew Baker, MRN:  [id number], DOB:  25-Feb-1941, LOS: 0 ADMISSION DATE:  11/18/2023, CONSULTATION DATE:  *** REFERRING MD:  ***, CHIEF COMPLAINT:  ***    HPI  83 y.o male with significant PMH of who presented to the ED with chief complaints of    ED Course: Initial vital signs showed Blood pressure (!) 89/52, pulse 97, temperature 98.3 F (36.8 C), temperature source Oral, resp. rate (!) 21,  and the oxygen saturation 90% on 6L [place]. He was working hard to breath so was placed on BiPAP support.  Pertinent Labs/Diagnostics Findings: Glucose: 345. CO2 17 WBC:11.0 K/L Hgb/Hct: 9.9/32.3 PCT: negative <0.10  Lactic acid:5.9 COVID PCR: Negative,  troponin: 110 BNP: 322.9  FUX:NATFTDDUK edema. Question left base atelectasis  Patient given 30 cc/kg of fluids and started on broad-spectrum antibiotics Ceftriaxone and Azithromycin for suspected sepsis due to pneumonia. Due to high risk for decompensation, PCCM consulted for ICU admission.  Past Medical History  ***  Significant Hospital Events   ***  Consults:  ***  Procedures:  ***  Significant Diagnostic Tests:  : Chest Xray>  : Noncontrast CT head>  : CTA Chest, abdomen and pelvis>  Interim History / Subjective:      Micro Data:  : SARS-CoV-2 PCR> negative : Influenza PCR> negative : Blood culture x2> : Urine Culture> : MRSA PCR>>  : Strep pneumo urinary antigen> : Legionella urinary antigen>  Antimicrobials:  Vancomycin Cefepime Azithromycin Ceftriaxone Metronidazole  OBJECTIVE  Blood pressure (!) 89/52, pulse 97, temperature 98.3 F (36.8 C), temperature source Oral, resp. rate (!) 21, height 6\' 1"  (1.854 m), weight 102.1 kg, SpO2 90%.        Intake/Output Summary (Last 24 hours) at 11/18/2023 2345 Last data filed at 11/18/2023 2321 Gross per 24 hour  Intake 766.67 ml  Output --  Net 766.67 ml   Filed Weights   11/18/23 2122  Weight: 102.1 kg     Physical Examination   GENERAL:  year-old critically ill patient lying in the bed  EYES: PEERLA. No scleral icterus. Extraocular muscles intact.  HEENT: Head atraumatic, normocephalic. Oropharynx and nasopharynx clear.  NECK:  No JVD, supple  LUNGS: Normal breath sounds bilaterally.  No use of accessory muscles of respiration.  CARDIOVASCULAR: S1, S2 normal. No murmurs, rubs, or gallops.  ABDOMEN: Soft, NTND EXTREMITIES: No swelling or erythema.  Capillary refill < 3 seconds in all extremities. Pulses palpable distally. NEUROLOGIC: The patient is . No focal neurological deficit appreciated. Cranial nerves are intact.  SKIN: No obvious rash, lesion, or ulcer. Warm to touch Labs/imaging that I {ACTIONS; HAVE/HAVE NOT:19434}personally reviewed  (right click and "Reselect all SmartList Selections" daily)     Labs   CBC: Recent Labs  Lab 11/18/23 2143  WBC 11.0*  NEUTROABS 9.4*  HGB 9.9*  HCT 32.3*  MCV 96.4  PLT 221    Basic Metabolic Panel: Recent Labs  Lab 11/18/23 2143  NA 136  K 3.9  CL 108  CO2 17*  GLUCOSE 345*  BUN 17  CREATININE 0.95  CALCIUM 8.3*   GFR: Estimated Creatinine Clearance: 74 mL/min (by C-G formula based on SCr of 0.95 mg/dL). Recent Labs  Lab 11/18/23 2143 11/18/23 2226  WBC 11.0*  --   LATICACIDVEN  --  5.9*    Liver Function Tests: Recent Labs  Lab 11/18/23 2143  AST 26  ALT 16  ALKPHOS 75  BILITOT 0.9  PROT 6.2*  ALBUMIN 3.2*   No  results for input(s): "LIPASE", "AMYLASE" in the last 168 hours. No results for input(s): "AMMONIA" in the last 168 hours.  ABG    Component Value Date/Time   HCO3 27.9 05/19/2023 2306   ACIDBASEDEF 7.0 (H) 05/19/2023 1928   O2SAT 72.3 05/19/2023 2306     Coagulation Profile: Recent Labs  Lab 11/18/23 2226  INR 1.2    Cardiac Enzymes: No results for input(s): "CKTOTAL", "CKMB", "CKMBINDEX", "TROPONINI" in the last 168 hours.  HbA1C: Hgb A1c MFr Bld  Date/Time Value Ref Range Status  05/19/2023 11:06 PM 8.7  (H) 4.8 - 5.6 % Final    Comment:    (NOTE)         Prediabetes: 5.7 - 6.4         Diabetes: >6.4         Glycemic control for adults with diabetes: <7.0   09/10/2020 04:31 AM 8.0 (H) 4.8 - 5.6 % Final    Comment:    (NOTE) Pre diabetes:          5.7%-6.4%  Diabetes:              >6.4%  Glycemic control for   <7.0% adults with diabetes     CBG: No results for input(s): "GLUCAP" in the last 168 hours.  Review of Systems:   ***  Past Medical History  He,  has a past medical history of Chronic airway obstruction (HCC), Degeneration of intervertebral disc of lumbar region, Diabetes mellitus without complication (HCC), Essential hypertension, benign, Hyperlipidemia, Hypertension, Osteoarthritis of lower extremity, Pneumonia, organism unspecified(486), PSA elevation (07/16/2013), and Stroke (HCC).   Surgical History    Past Surgical History:  Procedure Laterality Date   CATARACT EXTRACTION W/ INTRAOCULAR LENS  IMPLANT, BILATERAL     COLONOSCOPY  2006   Normal: Repeat in 10 yrs   IR PERC CHOLECYSTOSTOMY  05/20/2023   IR RADIOLOGIST EVAL & MGMT  06/22/2023   LOOP RECORDER INSERTION N/A 02/21/2020   Procedure: LOOP RECORDER INSERTION;  Surgeon: Marcina Millard, MD;  Location: [facility] INVASIVE CV [facility];  Service: Cardiovascular;  Laterality: N/A;   LOOP RECORDER REMOVAL N/A 10/23/2020   Procedure: LOOP RECORDER REMOVAL;  Surgeon: Marcina Millard, MD;  Location: [facility] INVASIVE CV [facility];  Service: Cardiovascular;  Laterality: N/A;   TEE WITHOUT CARDIOVERSION N/A 09/15/2020   Procedure: TRANSESOPHAGEAL ECHOCARDIOGRAM (TEE);  Surgeon: Dalia Heading, MD;  Location: [facility] ORS;  Service: Cardiovascular;  Laterality: N/A;     Social History   reports that he quit smoking about 65 years ago. His smoking use included cigarettes. He has never used smokeless tobacco. He reports that he does not drink alcohol and does not use drugs.   Family History   His family history includes Diabetes in  his mother.   Allergies No Known Allergies   Home Medications  Prior to Admission medications   Medication Sig Start Date End Date Taking? Authorizing Provider  albuterol (VENTOLIN HFA) 108 (90 Base) MCG/ACT inhaler Inhale 2 puffs into the lungs every 6 (six) hours as needed for wheezing or shortness of breath. 04/28/20   [provider]  Alcohol Swabs (ALCOHOL WIPES) 70 % PADS USE FOR INSULIN INJECTIONS OR TO FINGER STICK AS DIRECTED BY YOUR MEDICAL PROVIDER 04/28/20   [provider]  ascorbic acid (VITAMIN C) 500 MG tablet Take 1 tablet (500 mg total) by mouth daily. 09/17/20   Drema Dallas, MD  atorvastatin (LIPITOR) 40 MG tablet Take 40 mg by mouth at bedtime. 01/21/22  [provider]  atorvastatin (LIPITOR) 80 MG tablet Take 80 mg by mouth daily.    [provider]  docusate sodium (COLACE) 100 MG capsule Take 100 mg by mouth 2 (two) times daily. 05/02/23   [provider]  ELIQUIS 5 MG TABS tablet Take 5 mg by mouth 2 (two) times daily. 08/18/20   [provider]  empagliflozin (JARDIANCE) 25 MG TABS tablet 1 tablet 1 day or 1 dose. 12/09/20   [provider]  feeding supplement (ENSURE ENLIVE / ENSURE PLUS) LIQD Take 237 mLs by mouth 3 (three) times daily between meals. 05/24/23   Alford Highland, MD  fluconazole (DIFLUCAN) 100 MG tablet Take 1 tablet (100 mg total) by mouth daily. 05/26/23   Alford Highland, MD  furosemide (LASIX) 20 MG tablet Take 20 mg by mouth daily. 10/13/20   [provider]  glucose blood (PRECISION QID TEST) test strip Use to test blood sugar 2 times daily as instructed. Dx: E11.65 08/22/15   [provider]  HYDROcodone-acetaminophen (NORCO/VICODIN) 5-325 MG tablet Take 1 tablet by mouth every 8 (eight) hours as needed. 02/26/21   [provider]  insulin aspart (NOVOLOG) 100 UNIT/ML injection Inject 0-6 Units into the skin 3 (three) times daily as needed for high blood sugar.  05/24/23   Alford Highland, MD  insulin glargine (LANTUS) 100 UNIT/ML injection Inject 0.3 mLs (30 Units total) into the skin 2 (two) times daily. 05/24/23   Alford Highland, MD  Insulin Pen Needle (PEN NEEDLES 29GX1/2") 29G X MISC Use 1x a day 08/21/14   [provider]  ipratropium (ATROVENT HFA) 17 MCG/ACT inhaler Inhale 2 puffs into the lungs every 6 (six) hours. 09/16/20   Drema Dallas, MD  latanoprost (XALATAN) 0.005 % ophthalmic solution Place 1 drop into both eyes at bedtime.    [provider]  lisinopril (ZESTRIL) 5 MG tablet Take 5 mg by mouth daily. 03/03/23   [provider]  Magnesium Oxide 420 MG TABS Take 1 tablet by mouth daily. 10/31/20   [provider]  pantoprazole (PROTONIX) 40 MG tablet Take 1 tablet (40 mg total) by mouth daily. 05/25/23   Alford Highland, MD  sildenafil (VIAGRA) 100 MG tablet Take 1 tablet by mouth as needed. 12/08/20   [provider]  tamsulosin (FLOMAX) 0.4 MG CAPS capsule Take 0.4 mg by mouth at bedtime.    [provider]  vitamin B-12 (CYANOCOBALAMIN) 100 MCG tablet Take 100 mcg by mouth daily.    [provider]      Active Hospital Problem list   See systems below  Assessment & Plan:      Best practice:  Diet:  {ZOXW:96045} Pain/Anxiety/Delirium protocol (if indicated): {Pain/Anxiety/Delirium:26941} VAP protocol (if indicated): {VAP:29640} DVT prophylaxis: {DVT Prophylaxis:26933} GI prophylaxis: {GI:26934} Glucose control:  {Glucose Control:26935} Central venous access:  {Central Venous Access:26936} Arterial line:  {Central Venous Access:26936} Foley:  {Central Venous Access:26936} Mobility:  {Mobility:26937}  PT consulted: {PT Consult:26938} Last date of multidisciplinary goals of care discussion [***] Code Status:  {Code Status:26939} Disposition: ***   = Goals of Care = Code Status Order: @CODE @   Primary Emergency Contact: Florestine Avers Wishes to pursue  full aggressive treatment and intervention options, including CPR and intubation, but goals of care will be addressed on going with family if that should become necessary. Patient wishes to pursue ongoing treatment with relatively conservative measures (e.g., IV fluids, antibiotics), but would not wish to escalate to ICU level of care  or other invasive measures. Patient wishes to pursue ongoing treatment, but concurred that if deteriorated to pulselessness, patient would prefer a natural death as opposed to invasive measures such as CPR and intubation.  Family should be immediately contacted in such situations.  Critical care time: 45 minutes        Webb Silversmith DNP, CCRN, FNP-C, AGACNP-BC Acute Care & Family Nurse Practitioner [facility] PCCM on call pager [phone]

## 2023-11-19 NOTE — Progress Notes (Signed)
 PHARMACY - ANTICOAGULATION CONSULT NOTE  Pharmacy Consult for Heparin  Indication: atrial fibrillation  No Known Allergies  Patient Measurements: Height: 6\' 1"  (185.4 cm) Weight: 106.5 kg (234 lb 12.6 oz) IBW/kg (Calculated) : 79.9 Heparin Dosing Weight: 100.5 kg   Vital Signs: Temp: 98.1 F (36.7 C) (03/15 1600) Temp Source: Oral (03/15 1600) BP: 95/62 (03/15 1600) Pulse Rate: 83 (03/15 1600)  Labs: Recent Labs    11/18/23 2143 11/18/23 2226 11/18/23 2340 11/19/23 0347 11/19/23 0836 11/19/23 1603  HGB 9.9*  --   --  9.7*  --   --   HCT 32.3*  --   --  31.6*  --   --   PLT 221  --   --  218  --   --   APTT  --  28  --   --  102* 116*  LABPROT  --  15.7*  --   --   --   --   INR  --  1.2  --   --   --   --   HEPARINUNFRC  --  0.90*  --   --  >1.10*  --   CREATININE 0.95  --   --  1.32*  --   --   TROPONINIHS 110*  --  546*  --  >24,000*  --     Estimated Creatinine Clearance: 54.3 mL/min (A) (by C-G formula based on SCr of 1.32 mg/dL (H)).   Medical History: Past Medical History:  Diagnosis Date   Chronic airway obstruction (HCC)    Degeneration of intervertebral disc of lumbar region    Diabetes mellitus without complication (HCC)    Essential hypertension, benign    Hyperlipidemia    Hypertension    Osteoarthritis of lower extremity    Pneumonia, organism unspecified(486)    PSA elevation 07/16/2013   Normal   Stroke (HCC)     Medications:  Medications Prior to Admission  Medication Sig Dispense Refill Last Dose/Taking   albuterol (VENTOLIN HFA) 108 (90 Base) MCG/ACT inhaler Inhale 2 puffs into the lungs every 6 (six) hours as needed for wheezing or shortness of breath.      Alcohol Swabs (ALCOHOL WIPES) 70 % PADS USE FOR INSULIN INJECTIONS OR TO FINGER STICK AS DIRECTED BY YOUR MEDICAL PROVIDER      ascorbic acid (VITAMIN C) 500 MG tablet Take 1 tablet (500 mg total) by mouth daily. 30 tablet 0    atorvastatin (LIPITOR) 40 MG tablet Take 40 mg by  mouth at bedtime.      atorvastatin (LIPITOR) 80 MG tablet Take 80 mg by mouth daily.      docusate sodium (COLACE) 100 MG capsule Take 100 mg by mouth 2 (two) times daily.      ELIQUIS 5 MG TABS tablet Take 5 mg by mouth 2 (two) times daily.      empagliflozin (JARDIANCE) 25 MG TABS tablet 1 tablet 1 day or 1 dose.      feeding supplement (ENSURE ENLIVE / ENSURE PLUS) LIQD Take 237 mLs by mouth 3 (three) times daily between meals. 21330 mL 0    fluconazole (DIFLUCAN) 100 MG tablet Take 1 tablet (100 mg total) by mouth daily. 5 tablet 0    furosemide (LASIX) 20 MG tablet Take 20 mg by mouth daily.      glucose blood (PRECISION QID TEST) test strip Use to test blood sugar 2 times daily as instructed. Dx: E11.65      HYDROcodone-acetaminophen (NORCO/VICODIN) 5-325  MG tablet Take 1 tablet by mouth every 8 (eight) hours as needed.      insulin aspart (NOVOLOG) 100 UNIT/ML injection Inject 0-6 Units into the skin 3 (three) times daily as needed for high blood sugar.      insulin glargine (LANTUS) 100 UNIT/ML injection Inject 0.3 mLs (30 Units total) into the skin 2 (two) times daily.      Insulin Pen Needle (PEN NEEDLES 29GX1/2") 29G X MISC Use 1x a day      ipratropium (ATROVENT HFA) 17 MCG/ACT inhaler Inhale 2 puffs into the lungs every 6 (six) hours. 1 each 0    latanoprost (XALATAN) 0.005 % ophthalmic solution Place 1 drop into both eyes at bedtime.      lisinopril (ZESTRIL) 5 MG tablet Take 5 mg by mouth daily.      Magnesium Oxide 420 MG TABS Take 1 tablet by mouth daily.      pantoprazole (PROTONIX) 40 MG tablet Take 1 tablet (40 mg total) by mouth daily. 30 tablet 0    sildenafil (VIAGRA) 100 MG tablet Take 1 tablet by mouth as needed.      tamsulosin (FLOMAX) 0.4 MG CAPS capsule Take 0.4 mg by mouth at bedtime.      vitamin B-12 (CYANOCOBALAMIN) 100 MCG tablet Take 100 mcg by mouth daily.       Assessment: Pharmacy consulted to dose heparin for Afib in this 83 year old male admitted  with resp failure.  Pt was on Eliquis 5 mg PO BID PTA.  Last dose on 3/14 @ 0600.   Baseline HL (3/14 @ 2226) = 0.9, elevated from Eliquis CrCl = 74 ml/min   Goal of Therapy:  Heparin level 0.3-0.7 units/ml aPTT 66 - 102 seconds Monitor platelets by anticoagulation protocol: Yes  Date/Time: aPTT/HL: Comment/Rate: 3/15@0836  102/>1.1 Therapeutic@1500  units/hr 3/15@1603  116  SUPRA-therapeutic @ 1500 units/hr  Plan:  - aPTT SUPRA-therapeutic  - Decrease heparin infusion rate to 1300 units/hr - Continue to monitor H&H and platelets - Will use aPTT to guide dosing until correlating with aPTT - Will will check aPTT in 8 hrs  - CBC daily  Merryl Hacker, PharmD Clinical Pharmacist  11/19/2023,4:27 PM

## 2023-11-19 NOTE — Progress Notes (Signed)
 PHARMACY - ANTICOAGULATION CONSULT NOTE  Pharmacy Consult for Heparin  Indication: atrial fibrillation  No Known Allergies  Patient Measurements: Height: 6\' 1"  (185.4 cm) Weight: 102.1 kg (225 lb) IBW/kg (Calculated) : 79.9 Heparin Dosing Weight: 100.5 kg   Vital Signs: Temp: 98.3 F (36.8 C) (03/14 2121) Temp Source: Oral (03/14 2121) BP: 89/52 (03/14 2140) Pulse Rate: 97 (03/14 2140)  Labs: Recent Labs    11/18/23 2143 11/18/23 2226  HGB 9.9*  --   HCT 32.3*  --   PLT 221  --   APTT  --  28  LABPROT  --  15.7*  INR  --  1.2  HEPARINUNFRC  --  0.90*  CREATININE 0.95  --   TROPONINIHS 110*  --     Estimated Creatinine Clearance: 74 mL/min (by C-G formula based on SCr of 0.95 mg/dL).   Medical History: Past Medical History:  Diagnosis Date   Chronic airway obstruction (HCC)    Degeneration of intervertebral disc of lumbar region    Diabetes mellitus without complication (HCC)    Essential hypertension, benign    Hyperlipidemia    Hypertension    Osteoarthritis of lower extremity    Pneumonia, organism unspecified(486)    PSA elevation 07/16/2013   Normal   Stroke (HCC)     Medications:  (Not in a hospital admission)   Assessment: Pharmacy consulted to dose heparin for Afib in this 83 year old male admitted with resp failure.  Pt was on Eliquis 5 mg PO BID PTA.  Last dose on 3/14 @ 0600.   Baseline HL (3/14 @ 2226) = 0.9, elevated from Eliquis CrCl = 74 ml/min   Goal of Therapy:  Heparin level 0.3-0.7 units/ml aPTT 66 - 102 seconds Monitor platelets by anticoagulation protocol: Yes   Plan:  No bolus due to Eliquis PTA. Start heparin infusion at 1500 units/hr Continue to monitor H&H and platelets - Will use aPTT to guide dosing until correlating with aPTT - Will will recheck aPTT and HL 8 hrs after start of drip   Jaken Fregia D 11/19/2023,12:37 AM

## 2023-11-19 NOTE — Consult Note (Signed)
 PHARMACY CONSULT NOTE - ELECTROLYTES  Pharmacy Consult for Electrolyte Monitoring and Replacement   Recent Labs: Height: 6\' 1"  (185.4 cm) Weight: 106.5 kg (234 lb 12.6 oz) IBW/kg (Calculated) : 79.9 Estimated Creatinine Clearance: 54.3 mL/min (A) (by C-G formula based on SCr of 1.32 mg/dL (H)). Potassium (mmol/L)  Date Value  11/19/2023 3.9   Magnesium (mg/dL)  Date Value  64/40/3474 2.2   Calcium (mg/dL)  Date Value  25/95/6387 8.3 (L)   Albumin (g/dL)  Date Value  56/43/3295 3.2 (L)   Phosphorus (mg/dL)  Date Value  18/84/1660 3.9   Sodium (mmol/L)  Date Value  11/19/2023 138  07/15/2014 136 (A)   Corrected Ca: 8.94 mg/dL  Assessment  Mathew Baker is a 83 y.o. male presenting with shortness of breath. PMH significant for COPD, T2DM, CVA, hyperlipidemia, hypertension, cholecystitis s/p cholecystectomy and hepatocellular carcinoma. Pharmacy has been consulted to monitor and replace electrolytes.  Diet: NPO MIVF: LR @ 150 mL/hr Pertinent medications: N/A  Goal of Therapy: Electrolytes WNL  Plan:  No replacement currently indicated Check BMP, Mg, Phos with AM labs  Thank you for allowing pharmacy to be a part of this patient's care.  Bettey Costa, PharmD Clinical Pharmacist 11/19/2023 7:54 AM

## 2023-11-19 NOTE — Consult Note (Signed)
 [facility] CLINIC CARDIOLOGY CONSULT NOTE       Patient ID: Mathew Baker MRN: [id number] DOB/AGE: 01-16-1941 83 y.o.  Admit date: 11/18/2023 Referring Physician Dr Larinda Buttery Primary Physician Dareen Piano, Marya Amsler, MD Primary Cardiologist [facility] cardiology Reason for Consultation NSTEMI  HPI: Mathew Baker is a 83 y.o. male  with a past medical history of  of COPD, T2DM, CVA, hyperlipidemia, hypertension, cholecystitis s/p cholecystectomy and hepatocellular carcinoma treated with Y90 who presented to the ED with chief complaints of progressive shortness of breath x 3 days. Patient states he has also been experiencing some chest tightness. Of note, his breathing has been much worse recently. Patient has not ever had a heart catheterization before but he is agreeable to proceed with this on Monday given elevated troponins and worsening SOB/chest pain. His breathing has slightly improved since admission. No complaints or concerns this morning. Family at bedside, discussed plan and they are agreeable.   Review of systems complete and found to be negative unless listed above     Past Medical History:  Diagnosis Date   Chronic airway obstruction (HCC)    Degeneration of intervertebral disc of lumbar region    Diabetes mellitus without complication (HCC)    Essential hypertension, benign    Hyperlipidemia    Hypertension    Osteoarthritis of lower extremity    Pneumonia, organism unspecified(486)    PSA elevation 07/16/2013   Normal   Stroke [facility])     Past Surgical History:  Procedure Laterality Date   CATARACT EXTRACTION W/ INTRAOCULAR LENS  IMPLANT, BILATERAL     COLONOSCOPY  2006   Normal: Repeat in 10 yrs   IR PERC CHOLECYSTOSTOMY  05/20/2023   IR RADIOLOGIST EVAL & MGMT  06/22/2023   LOOP RECORDER INSERTION N/A 02/21/2020   Procedure: LOOP RECORDER INSERTION;  Surgeon: Marcina Millard, MD;  Location: [facility] INVASIVE CV LAB;  Service: Cardiovascular;  Laterality: N/A;    LOOP RECORDER REMOVAL N/A 10/23/2020   Procedure: LOOP RECORDER REMOVAL;  Surgeon: Marcina Millard, MD;  Location: [facility] INVASIVE CV LAB;  Service: Cardiovascular;  Laterality: N/A;   TEE WITHOUT CARDIOVERSION N/A 09/15/2020   Procedure: TRANSESOPHAGEAL ECHOCARDIOGRAM (TEE);  Surgeon: Dalia Heading, MD;  Location: [facility] ORS;  Service: Cardiovascular;  Laterality: N/A;    Medications Prior to Admission  Medication Sig Dispense Refill Last Dose/Taking   albuterol (VENTOLIN HFA) 108 (90 Base) MCG/ACT inhaler Inhale 2 puffs into the lungs every 6 (six) hours as needed for wheezing or shortness of breath.      Alcohol Swabs (ALCOHOL WIPES) 70 % PADS USE FOR INSULIN INJECTIONS OR TO FINGER STICK AS DIRECTED BY YOUR MEDICAL PROVIDER      ascorbic acid (VITAMIN C) 500 MG tablet Take 1 tablet (500 mg total) by mouth daily. 30 tablet 0    atorvastatin (LIPITOR) 40 MG tablet Take 40 mg by mouth at bedtime.      atorvastatin (LIPITOR) 80 MG tablet Take 80 mg by mouth daily.      docusate sodium (COLACE) 100 MG capsule Take 100 mg by mouth 2 (two) times daily.      ELIQUIS 5 MG TABS tablet Take 5 mg by mouth 2 (two) times daily.      empagliflozin (JARDIANCE) 25 MG TABS tablet 1 tablet 1 day or 1 dose.      feeding supplement (ENSURE ENLIVE / ENSURE PLUS) LIQD Take 237 mLs by mouth 3 (three) times daily between meals. 95638 mL 0    fluconazole (  DIFLUCAN) 100 MG tablet Take 1 tablet (100 mg total) by mouth daily. 5 tablet 0    furosemide (LASIX) 20 MG tablet Take 20 mg by mouth daily.      glucose blood (PRECISION QID TEST) test strip Use to test blood sugar 2 times daily as instructed. Dx: E11.65      HYDROcodone-acetaminophen (NORCO/VICODIN) 5-325 MG tablet Take 1 tablet by mouth every 8 (eight) hours as needed.      insulin aspart (NOVOLOG) 100 UNIT/ML injection Inject 0-6 Units into the skin 3 (three) times daily as needed for high blood sugar.      insulin glargine (LANTUS) 100 UNIT/ML injection  Inject 0.3 mLs (30 Units total) into the skin 2 (two) times daily.      Insulin Pen Needle (PEN NEEDLES 29GX1/2") 29G X MISC Use 1x a day      ipratropium (ATROVENT HFA) 17 MCG/ACT inhaler Inhale 2 puffs into the lungs every 6 (six) hours. 1 each 0    latanoprost (XALATAN) 0.005 % ophthalmic solution Place 1 drop into both eyes at bedtime.      lisinopril (ZESTRIL) 5 MG tablet Take 5 mg by mouth daily.      Magnesium Oxide 420 MG TABS Take 1 tablet by mouth daily.      pantoprazole (PROTONIX) 40 MG tablet Take 1 tablet (40 mg total) by mouth daily. 30 tablet 0    sildenafil (VIAGRA) 100 MG tablet Take 1 tablet by mouth as needed.      tamsulosin (FLOMAX) 0.4 MG CAPS capsule Take 0.4 mg by mouth at bedtime.      vitamin B-12 (CYANOCOBALAMIN) 100 MCG tablet Take 100 mcg by mouth daily.      Social History   Socioeconomic History   Marital status: Married    Spouse name: Not on file   Number of children: Not on file   Years of education: Not on file   Highest education level: Not on file  Occupational History   Not on file  Tobacco Use   Smoking status: Former    Current packs/day: 0.00    Types: Cigarettes    Quit date: 09/06/1958    Years since quitting: 65.2   Smokeless tobacco: Never  Substance and Sexual Activity   Alcohol use: No    Alcohol/week: 0.0 standard drinks of alcohol   Drug use: No   Sexual activity: Not on file  Other Topics Concern   Not on file  Social History Narrative   Married   Retired; 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; One of the original [employer] in the early 60's   1 daughter   Social Drivers of Corporate investment banker Strain: Low Risk  (05/30/2023)   Received from [facility]   Overall Financial Resource Strain (CARDIA)    Difficulty of Paying Living Expenses: Not hard at all  Food Insecurity: No Food Insecurity (11/19/2023)   Hunger Vital Sign    Worried About Running Out of Food in the Last Year: Never true    Ran Out of Food in the  Last Year: Never true  Transportation Needs: No Transportation Needs (11/19/2023)   PRAPARE - Administrator, Civil Service (Medical): No    Lack of Transportation (Non-Medical): No  Physical Activity: Not on file  Stress: Not on file  Social Connections: Moderately Isolated (11/19/2023)   Social Connection and Isolation Panel [NHANES]    Frequency of Communication with Friends and Family: Once a week  Frequency of Social Gatherings with Friends and Family: Once a week    Attends Religious Services: More than 4 times per year    Active Member of Clubs or Organizations: No    Attends Banker Meetings: Never    Marital Status: Married  Catering manager Violence: Not At Risk (11/19/2023)   Humiliation, Afraid, Rape, and Kick questionnaire    Fear of Current or Ex-Partner: No    Emotionally Abused: No    Physically Abused: No    Sexually Abused: No    Family History  Problem Relation Age of Onset   Diabetes Mother      Vitals:   11/19/23 1045 11/19/23 1100 11/19/23 1115 11/19/23 1200  BP: (!) 88/71 104/60 108/68 105/63  Pulse: 82 84 83 79  Resp: 17 (!) 25 (!) 21 18  Temp:    98.6 F (37 C)  TempSrc:    Axillary  SpO2: 99% 99% 97% 99%  Weight:      Height:        PHYSICAL EXAM General: awake, well nourished, in no acute distress. HEENT: Normocephalic and atraumatic. Neck: No JVD.  Lungs: on bipap, decreased breath sounds bilaterally, coarse throughout Heart: HRRR. Normal S1 and S2 without gallops or murmurs.  Abdomen: Non-distended appearing.  Msk: Normal strength and tone for age. Extremities: Warm and well perfused. No clubbing, cyanosis. no edema.  Neuro: Alert and oriented X 3. Psych: Answers questions appropriately.   Labs: Basic Metabolic Panel: Recent Labs    11/18/23 2143 11/19/23 0347  NA 136 138  K 3.9 3.9  CL 108 105  CO2 17* 19*  GLUCOSE 345* 392*  BUN 17 22  CREATININE 0.95 1.32*  CALCIUM 8.3* 8.3*  MG  --  2.2  PHOS   --  3.9   Liver Function Tests: Recent Labs    11/18/23 2143 11/19/23 0836  AST 26 122*  ALT 16 27  ALKPHOS 75 59  BILITOT 0.9 0.6  PROT 6.2* 6.2*  ALBUMIN 3.2* 3.0*   No results for input(s): "LIPASE", "AMYLASE" in the last 72 hours. CBC: Recent Labs    11/18/23 2143 11/19/23 0347  WBC 11.0* 9.4  NEUTROABS 9.4*  --   HGB 9.9* 9.7*  HCT 32.3* 31.6*  MCV 96.4 95.5  PLT 221 218   Cardiac Enzymes: Recent Labs    11/18/23 2143 11/18/23 2340 11/19/23 0836  TROPONINIHS 110* 546* >24,000*   BNP: Recent Labs    11/18/23 2143  BNP 322.9*   D-Dimer: Recent Labs    11/18/23 2340  DDIMER 0.56*   Hemoglobin A1C: No results for input(s): "HGBA1C" in the last 72 hours. Fasting Lipid Panel: No results for input(s): "CHOL", "HDL", "LDLCALC", "TRIG", "CHOLHDL", "LDLDIRECT" in the last 72 hours. Thyroid Function Tests: No results for input(s): "TSH", "T4TOTAL", "T3FREE", "THYROIDAB" in the last 72 hours.  Invalid input(s): "FREET3" Anemia Panel: No results for input(s): "VITAMINB12", "FOLATE", "FERRITIN", "TIBC", "IRON", "RETICCTPCT" in the last 72 hours.   Radiology: US Venous Img Lower Bilateral (DVT) Result Date: 11/19/2023 CLINICAL DATA:  Bilateral lower extremity edema worse on the right than the left EXAM: BILATERAL LOWER EXTREMITY VENOUS DOPPLER ULTRASOUND TECHNIQUE: Gray-scale sonography with graded compression, as well as color Doppler and duplex ultrasound were performed to evaluate the lower extremity deep venous systems from the level of the common femoral vein and including the common femoral, femoral, profunda femoral, popliteal and calf veins including the posterior tibial, peroneal and gastrocnemius veins when visible. The superficial  great saphenous vein was also interrogated. Spectral Doppler was utilized to evaluate flow at rest and with distal augmentation maneuvers in the common femoral, femoral and popliteal veins. COMPARISON:  None Available. FINDINGS:  RIGHT LOWER EXTREMITY Common Femoral Vein: No evidence of thrombus. Normal compressibility, respiratory phasicity and response to augmentation. Saphenofemoral Junction: No evidence of thrombus. Normal compressibility and flow on color Doppler imaging. Profunda Femoral Vein: No evidence of thrombus. Normal compressibility and flow on color Doppler imaging. Femoral Vein: No evidence of thrombus. Normal compressibility, respiratory phasicity and response to augmentation. Popliteal Vein: No evidence of thrombus. Normal compressibility, respiratory phasicity and response to augmentation. Calf Veins: No evidence of thrombus. Normal compressibility and flow on color Doppler imaging. Superficial Great Saphenous Vein: No evidence of thrombus. Normal compressibility. Venous Reflux:  None. Other Findings:  None. LEFT LOWER EXTREMITY Common Femoral Vein: No evidence of thrombus. Normal compressibility, respiratory phasicity and response to augmentation. Saphenofemoral Junction: No evidence of thrombus. Normal compressibility and flow on color Doppler imaging. Profunda Femoral Vein: No evidence of thrombus. Normal compressibility and flow on color Doppler imaging. Femoral Vein: No evidence of thrombus. Normal compressibility, respiratory phasicity and response to augmentation. Popliteal Vein: No evidence of thrombus. Normal compressibility, respiratory phasicity and response to augmentation. Calf Veins: No evidence of thrombus. Normal compressibility and flow on color Doppler imaging. Superficial Great Saphenous Vein: No evidence of thrombus. Normal compressibility. Venous Reflux:  None. Other Findings:  None. IMPRESSION: No evidence of deep venous thrombosis in either lower extremity. Electronically Signed   By: Malachy Moan M.D.   On: 11/19/2023 08:54   CT CHEST ABDOMEN PELVIS W CONTRAST Result Date: 11/19/2023 CLINICAL DATA:  Sepsis.  Shortness of breath.  Recent pneumonia. EXAM: CT CHEST, ABDOMEN, AND PELVIS WITH  CONTRAST TECHNIQUE: Multidetector CT imaging of the chest, abdomen and pelvis was performed following the standard protocol during bolus administration of intravenous contrast. RADIATION DOSE REDUCTION: This exam was performed according to the departmental dose-optimization program which includes automated exposure control, adjustment of the mA and/or kV according to patient size and/or use of iterative reconstruction technique. CONTRAST:  OMNIPAQUE IOHEXOL 300 MG/ML  SOLN COMPARISON:  06/22/2023.  Chest x-ray today. FINDINGS: CT CHEST FINDINGS Cardiovascular: Heart borderline in size. Diffuse coronary artery and moderate aortic atherosclerosis. No evidence of aortic aneurysm or dissection. No filling defects in the pulmonary arteries to suggest pulmonary emboli. Mediastinum/Nodes: No mediastinal, hilar, or axillary adenopathy. Trachea and esophagus are unremarkable. Thyroid unremarkable. Lungs/Pleura: Small to moderate bilateral pleural effusions. 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 Airspace opacities in the lower lobes. Scattered ground-glass opacities in the lungs could reflect atelectasis or early edema. Musculoskeletal: Chest wall soft tissues are unremarkable. No acute bony abnormality. CT ABDOMEN PELVIS FINDINGS Hepatobiliary: No focal liver abnormality is seen. Status post cholecystectomy. No biliary dilatation. Pancreas: No focal abnormality or ductal dilatation. Spleen: No focal abnormality.  Normal size. Adrenals/Urinary Tract: Bilateral perinephric stranding, stable since prior study. No suspicious renal or adrenal lesion. No stones or hydronephrosis. Urinary bladder unremarkable. Stomach/Bowel: Normal appendix. Stomach, large and small bowel grossly unremarkable. Vascular/Lymphatic: Aortic atherosclerosis. No evidence of aneurysm or adenopathy. Reproductive: Prostate enlargement Other: No free fluid or free air. Musculoskeletal: No acute bony abnormality. IMPRESSION: Small to  moderate bilateral pleural effusions. Bilateral lower lobe airspace opacities could reflect atelectasis or pneumonia. Diffuse coronary artery disease.  Aortic atherosclerosis. Borderline heart size. Scattered ground-glass opacities in the lungs could reflect atelectasis or early edema. No acute findings in the abdomen or  pelvis. Electronically Signed   By: Charlett Nose M.D.   On: 11/19/2023 00:18   DG Chest Port 1 View Result Date: 11/18/2023 CLINICAL DATA:  SHOB, hx of PNA EXAM: PORTABLE CHEST 1 VIEW COMPARISON:  Chest x-ray 06/22/2023 FINDINGS: The heart and mediastinal contours are unchanged. Atherosclerotic plaque. Prominent hilar vasculature. Question left base atelectasis. Increased interstitial markings. No pleural effusion. No pneumothorax. No acute osseous abnormality. IMPRESSION: 1. Pulmonary edema. 2. Question left base atelectasis. Recommend further evaluation with PA and lateral view of the chest. 3.  Aortic Atherosclerosis (ICD10-I70.0). Electronically Signed   By: Tish Frederickson M.D.   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 Prefer patient be off Bipap for cath. Discussed with patient, family, and ICU team.  Trops: 110 --> 564 --> 24,000 doubt demand ischemia given very high rise in trops Optimize lytes, K>4 and Mag>2  PAF Continue on heparin gtt Metoprolol as needed for RVR Currently patient is rate controlled  HTN Restart home meds as BP allows  HLD On lipitor 80 mg     Signed: Clotilde Dieter, DO 11/19/2023, 12:56 PM [facility] Cardiology

## 2023-11-20 ENCOUNTER — Inpatient Hospital Stay

## 2023-11-20 DIAGNOSIS — I214 Non-ST elevation (NSTEMI) myocardial infarction: Secondary | ICD-10-CM

## 2023-11-20 DIAGNOSIS — J189 Pneumonia, unspecified organism: Secondary | ICD-10-CM | POA: Diagnosis not present

## 2023-11-20 DIAGNOSIS — J441 Chronic obstructive pulmonary disease with (acute) exacerbation: Secondary | ICD-10-CM | POA: Diagnosis not present

## 2023-11-20 DIAGNOSIS — J9601 Acute respiratory failure with hypoxia: Secondary | ICD-10-CM | POA: Diagnosis not present

## 2023-11-20 DIAGNOSIS — J90 Pleural effusion, not elsewhere classified: Secondary | ICD-10-CM | POA: Diagnosis not present

## 2023-11-20 DIAGNOSIS — J810 Acute pulmonary edema: Secondary | ICD-10-CM

## 2023-11-20 LAB — BASIC METABOLIC PANEL
Anion gap: 6 (ref 5–15)
BUN: 40 mg/dL — ABNORMAL HIGH (ref 8–23)
CO2: 24 mmol/L (ref 22–32)
Calcium: 8.3 mg/dL — ABNORMAL LOW (ref 8.9–10.3)
Chloride: 107 mmol/L (ref 98–111)
Creatinine, Ser: 1.15 mg/dL (ref 0.61–1.24)
GFR, Estimated: >60 mL/min (ref >60)
Glucose, Bld: 160 mg/dL — ABNORMAL HIGH (ref 70–99)
Potassium: 4.9 mmol/L (ref 3.5–5.1)
Sodium: 137 mmol/L (ref 135–145)

## 2023-11-20 LAB — RESPIRATORY PANEL BY PCR

## 2023-11-20 LAB — LACTIC ACID, PLASMA: Lactic Acid, Venous: 1.8 mmol/L (ref 0.5–1.9)

## 2023-11-20 LAB — CBC
HCT: 30.3 % — ABNORMAL LOW (ref 39.0–52.0)
Hemoglobin: 9.7 g/dL — ABNORMAL LOW (ref 13.0–17.0)
MCH: 30.1 pg (ref 26.0–34.0)
MCHC: 32 g/dL (ref 30.0–36.0)
MCV: 94.1 fL (ref 80.0–100.0)
Platelets: 209 10*3/uL (ref 150–400)
RBC: 3.22 MIL/uL — ABNORMAL LOW (ref 4.22–5.81)
RDW: 17.3 % — ABNORMAL HIGH (ref 11.5–15.5)
WBC: 21.6 10*3/uL — ABNORMAL HIGH (ref 4.0–10.5)
nRBC: 0 % (ref 0.0–0.2)

## 2023-11-20 LAB — GLUCOSE, CAPILLARY
Glucose-Capillary: 156 mg/dL — ABNORMAL HIGH (ref 70–99)
Glucose-Capillary: 115 mg/dL — ABNORMAL HIGH (ref 70–99)
Glucose-Capillary: 183 mg/dL — ABNORMAL HIGH (ref 70–99)
Glucose-Capillary: 166 mg/dL — ABNORMAL HIGH (ref 70–99)
Glucose-Capillary: 149 mg/dL — ABNORMAL HIGH (ref 70–99)

## 2023-11-20 LAB — HEPARIN LEVEL (UNFRACTIONATED): Heparin Unfractionated: 0.89 [IU]/mL — ABNORMAL HIGH (ref 0.30–0.70)

## 2023-11-20 LAB — APTT
aPTT: 80 s — ABNORMAL HIGH (ref 24–36)
aPTT: 63 s — ABNORMAL HIGH (ref 24–36)
aPTT: 73 s — ABNORMAL HIGH (ref 24–36)

## 2023-11-20 LAB — PHOSPHORUS: Phosphorus: 3.1 mg/dL (ref 2.5–4.6)

## 2023-11-20 LAB — PROCALCITONIN: Procalcitonin: 0.11 ng/mL

## 2023-11-20 LAB — MAGNESIUM: Magnesium: 2.3 mg/dL (ref 1.7–2.4)

## 2023-11-20 MED ORDER — ASPIRIN 81 MG PO CHEW
81.0000 mg | CHEWABLE_TABLET | ORAL | Status: AC
  Administered 2023-11-21: 81 mg via ORAL
  Filled 2023-11-20: qty 1

## 2023-11-20 MED ORDER — SODIUM CHLORIDE 0.9 % IV SOLN: INTRAVENOUS | Status: DC

## 2023-11-20 MED ORDER — HEPARIN BOLUS VIA INFUSION
1500.0000 [IU] | Freq: Once | INTRAVENOUS | Status: AC
  Administered 2023-11-20: 1500 [IU] via INTRAVENOUS
  Filled 2023-11-20: qty 1500

## 2023-11-20 MED ORDER — INSULIN ASPART 100 UNIT/ML IJ SOLN: 0.0000 [IU] | Freq: Every day | INTRAMUSCULAR | Status: DC

## 2023-11-20 MED ORDER — FUROSEMIDE 10 MG/ML IJ SOLN
40.0000 mg | Freq: Once | INTRAMUSCULAR | Status: AC
  Administered 2023-11-20: 40 mg via INTRAVENOUS
  Filled 2023-11-20: qty 4

## 2023-11-20 MED ORDER — ONDANSETRON HCL 4 MG/2ML IJ SOLN: 4.0000 mg | Freq: Four times a day (QID) | INTRAMUSCULAR | Status: DC | PRN

## 2023-11-20 MED ORDER — ONDANSETRON HCL 4 MG/2ML IJ SOLN
4.0000 mg | Freq: Once | INTRAMUSCULAR | Status: AC
Start: 2023-11-20 — End: 2023-11-20
  Administered 2023-11-20: 4 mg via INTRAVENOUS
  Filled 2023-11-20: qty 2

## 2023-11-20 MED ORDER — IPRATROPIUM-ALBUTEROL 0.5-2.5 (3) MG/3ML IN SOLN
3.0000 mL | RESPIRATORY_TRACT | Status: DC
Start: 2023-11-20 — End: 2023-11-22
  Administered 2023-11-20 – 2023-11-22 (×12): 3 mL via RESPIRATORY_TRACT
  Filled 2023-11-20 (×13): qty 3

## 2023-11-20 MED ORDER — INSULIN ASPART 100 UNIT/ML IJ SOLN
0.0000 [IU] | Freq: Three times a day (TID) | INTRAMUSCULAR | Status: DC
  Administered 2023-11-20 – 2023-11-21 (×4): 3 [IU] via SUBCUTANEOUS
  Administered 2023-11-22: 5 [IU] via SUBCUTANEOUS
  Administered 2023-11-22: 8 [IU] via SUBCUTANEOUS
  Filled 2023-11-20 (×6): qty 1

## 2023-11-20 NOTE — Progress Notes (Signed)
 PHARMACY - ANTICOAGULATION CONSULT NOTE  Pharmacy Consult for Heparin  Indication: atrial fibrillation  No Known Allergies  Patient Measurements: Height: 6\' 1"  (185.4 cm) Weight: 106.5 kg (234 lb 12.6 oz) IBW/kg (Calculated) : 79.9 Heparin Dosing Weight: 100.5 kg   Vital Signs: Temp: 98.2 F (36.8 C) (03/16 0000) Temp Source: Oral (03/16 0000) BP: 97/58 (03/15 2300) Pulse Rate: 84 (03/15 2300)  Labs: Recent Labs    11/18/23 2143 11/18/23 2226 11/18/23 2226 11/18/23 2340 11/19/23 0347 11/19/23 0836 11/19/23 1603 11/20/23 0036  HGB 9.9*  --   --   --  9.7*  --   --   --   HCT 32.3*  --   --   --  31.6*  --   --   --   PLT 221  --   --   --  218  --   --   --   APTT  --  28   < >  --   --  102* 116* 80*  LABPROT  --  15.7*  --   --   --   --   --   --   INR  --  1.2  --   --   --   --   --   --   HEPARINUNFRC  --  0.90*  --   --   --  >1.10*  --   --   CREATININE 0.95  --   --   --  1.32*  --   --   --   TROPONINIHS 110*  --   --  546*  --  >24,000*  --   --    < > = values in this interval not displayed.    Estimated Creatinine Clearance: 54.3 mL/min (A) (by C-G formula based on SCr of 1.32 mg/dL (H)).   Medical History: Past Medical History:  Diagnosis Date   Chronic airway obstruction (HCC)    Degeneration of intervertebral disc of lumbar region    Diabetes mellitus without complication (HCC)    Essential hypertension, benign    Hyperlipidemia    Hypertension    Osteoarthritis of lower extremity    Pneumonia, organism unspecified(486)    PSA elevation 07/16/2013   Normal   Stroke (HCC)     Medications:  Medications Prior to Admission  Medication Sig Dispense Refill Last Dose/Taking   albuterol (VENTOLIN HFA) 108 (90 Base) MCG/ACT inhaler Inhale 2 puffs into the lungs every 6 (six) hours as needed for wheezing or shortness of breath.      Alcohol Swabs (ALCOHOL WIPES) 70 % PADS USE FOR INSULIN INJECTIONS OR TO FINGER STICK AS DIRECTED BY YOUR MEDICAL  PROVIDER      ascorbic acid (VITAMIN C) 500 MG tablet Take 1 tablet (500 mg total) by mouth daily. 30 tablet 0    atorvastatin (LIPITOR) 40 MG tablet Take 40 mg by mouth at bedtime.      atorvastatin (LIPITOR) 80 MG tablet Take 80 mg by mouth daily.      docusate sodium (COLACE) 100 MG capsule Take 100 mg by mouth 2 (two) times daily.      ELIQUIS 5 MG TABS tablet Take 5 mg by mouth 2 (two) times daily.      empagliflozin (JARDIANCE) 25 MG TABS tablet 1 tablet 1 day or 1 dose.      feeding supplement (ENSURE ENLIVE / ENSURE PLUS) LIQD Take 237 mLs by mouth 3 (three) times daily between meals. 21330  mL 0    fluconazole (DIFLUCAN) 100 MG tablet Take 1 tablet (100 mg total) by mouth daily. 5 tablet 0    furosemide (LASIX) 20 MG tablet Take 20 mg by mouth daily.      glucose blood (PRECISION QID TEST) test strip Use to test blood sugar 2 times daily as instructed. Dx: E11.65      HYDROcodone-acetaminophen (NORCO/VICODIN) 5-325 MG tablet Take 1 tablet by mouth every 8 (eight) hours as needed.      insulin aspart (NOVOLOG) 100 UNIT/ML injection Inject 0-6 Units into the skin 3 (three) times daily as needed for high blood sugar.      insulin glargine (LANTUS) 100 UNIT/ML injection Inject 0.3 mLs (30 Units total) into the skin 2 (two) times daily.      Insulin Pen Needle (PEN NEEDLES 29GX1/2") 29G X MISC Use 1x a day      ipratropium (ATROVENT HFA) 17 MCG/ACT inhaler Inhale 2 puffs into the lungs every 6 (six) hours. 1 each 0    latanoprost (XALATAN) 0.005 % ophthalmic solution Place 1 drop into both eyes at bedtime.      lisinopril (ZESTRIL) 5 MG tablet Take 5 mg by mouth daily.      Magnesium Oxide 420 MG TABS Take 1 tablet by mouth daily.      pantoprazole (PROTONIX) 40 MG tablet Take 1 tablet (40 mg total) by mouth daily. 30 tablet 0    sildenafil (VIAGRA) 100 MG tablet Take 1 tablet by mouth as needed.      tamsulosin (FLOMAX) 0.4 MG CAPS capsule Take 0.4 mg by mouth at bedtime.      vitamin  B-12 (CYANOCOBALAMIN) 100 MCG tablet Take 100 mcg by mouth daily.       Assessment: Pharmacy consulted to dose heparin for Afib in this 83 year old male admitted with resp failure.  Pt was on Eliquis 5 mg PO BID PTA.  Last dose on 3/14 @ 0600.   Baseline HL (3/14 @ 2226) = 0.9, elevated from Eliquis CrCl = 74 ml/min   Goal of Therapy:  Heparin level 0.3-0.7 units/ml aPTT 66 - 102 seconds Monitor platelets by anticoagulation protocol: Yes  Date/Time: aPTT/HL: Comment/Rate: 3/15@0836  102/>1.1 Therapeutic@1500  units/hr 3/15@1603  116  SUPRA-therapeutic @ 1500 units/hr 3/16@0036          80               Therapeutic @ 1300 units/hr  Plan:  3/16:  aPTT @ 0036 = 80, therapeutic X 1 - Will continue pt on current rate and recheck HL and aPTT in 8 hrs  - Continue to monitor H&H and platelets - Will use aPTT to guide dosing until correlating with aPTT - Will will check aPTT in 8 hrs  - CBC daily  Anthonette Lesage D, PharmD Clinical Pharmacist  11/20/2023,1:43 AM

## 2023-11-20 NOTE — Progress Notes (Signed)
 [facility] CLINIC CARDIOLOGY PROGRESS NOTE   Patient ID: Mathew Baker MRN: [id number] DOB/AGE: 05-20-1941 83 y.o.  Admit date: 11/18/2023 Referring Physician Dr Larinda Buttery Primary Physician Dareen Piano, Marya Amsler, MD Primary Cardiologist [facility] cardiology Reason for Consultation NSTEMI   HPI: Mathew Baker is a 83 y.o. male  with a past medical history of  of COPD, T2DM, CVA, hyperlipidemia, hypertension, cholecystitis s/p cholecystectomy and hepatocellular carcinoma treated with Y90 who presented to the ED with chief complaints of progressive shortness of breath x 3 days. Patient states he has also been experiencing some chest tightness. Of note, his breathing has been much worse recently. Patient has not ever had a heart catheterization before but he is agreeable to proceed with this on Monday given elevated troponins and worsening SOB/chest pain. Family at bedside, discussed plan and they are agreeable.  Interval History:  -Patient seen and examined at bedside, resting comfortably.  No events overnight.  His breathing is a little bit better, he remains on BiPAP this morning.  Patient is ready to proceed with cardiac catheterization tomorrow.  Review of systems complete and found to be negative unless listed above    Vitals:   11/20/23 0800 11/20/23 0900 11/20/23 1000 11/20/23 1100  BP: 111/63 114/61 111/67 119/65  Pulse: 83 (!) 110 90 85  Resp: 20 16 13 13   Temp: 98.5 F (36.9 C)     TempSrc: Oral     SpO2: 100% (!) 64% 99% 98%  Weight:      Height:         Intake/Output Summary (Last 24 hours) at 11/20/2023 1313 Last data filed at 11/20/2023 1021 Gross per 24 hour  Intake 778.54 ml  Output 1030 ml  Net -251.46 ml     PHYSICAL EXAM General: awake, well nourished, in no acute distress. HEENT: Normocephalic and atraumatic. Neck: No JVD.  Lungs: on bipap, decreased breath sounds bilaterally, coarse throughout Heart: HRRR. Normal S1 and S2 without gallops or murmurs.   Abdomen: Non-distended appearing.  Msk: Normal strength and tone for age. Extremities: Warm and well perfused. No clubbing, cyanosis. no edema.  Neuro: Alert and oriented X 3. Psych: Answers questions appropriately   LABS: Basic Metabolic Panel: Recent Labs    11/19/23 0347 11/20/23 0346  NA 138 137  K 3.9 4.9  CL 105 107  CO2 19* 24  GLUCOSE 392* 160*  BUN 22 40*  CREATININE 1.32* 1.15  CALCIUM 8.3* 8.3*  MG 2.2 2.3  PHOS 3.9 3.1   Liver Function Tests: Recent Labs    11/18/23 2143 11/19/23 0836  AST 26 122*  ALT 16 27  ALKPHOS 75 59  BILITOT 0.9 0.6  PROT 6.2* 6.2*  ALBUMIN 3.2* 3.0*   No results for input(s): "LIPASE", "AMYLASE" in the last 72 hours. CBC: Recent Labs    11/18/23 2143 11/19/23 0347 11/20/23 0346  WBC 11.0* 9.4 21.6*  NEUTROABS 9.4*  --   --   HGB 9.9* 9.7* 9.7*  HCT 32.3* 31.6* 30.3*  MCV 96.4 95.5 94.1  PLT 221 218 209   Cardiac Enzymes: Recent Labs    11/18/23 2143 11/18/23 2340 11/19/23 0836  TROPONINIHS 110* 546* >24,000*   BNP: Recent Labs    11/18/23 2143 11/19/23 1114  BNP 322.9* 917.8*   D-Dimer: Recent Labs    11/18/23 2340  DDIMER 0.56*   Hemoglobin A1C: Recent Labs    11/18/23 2340  HGBA1C 9.1*   Fasting Lipid Panel: No results for input(s): "CHOL", "HDL", "LDLCALC", "  TRIG", "CHOLHDL", "LDLDIRECT" in the last 72 hours. Thyroid Function Tests: No results for input(s): "TSH", "T4TOTAL", "T3FREE", "THYROIDAB" in the last 72 hours.  Invalid input(s): "FREET3" Anemia Panel: No results for input(s): "VITAMINB12", "FOLATE", "FERRITIN", "TIBC", "IRON", "RETICCTPCT" in the last 72 hours.  DG Chest Port 1 View Result Date: 11/20/2023 CLINICAL DATA:  Acute respiratory failure. EXAM: PORTABLE CHEST 1 VIEW COMPARISON:  11/19/2023 FINDINGS: Mild cardiac enlargement, stable. Bilateral posterior layering pleural effusions with veil like opacification over the lower lung zones unchanged. Overlying areas of  atelectasis. Mild increase interstitial markings compatible with pulmonary edema. IMPRESSION: 1. No change in appearance of the chest compared with the prior study. 2. Cardiac enlargement with persistent pleural effusions, bibasilar atelectasis and mild interstitial edema. Electronically Signed   By: Signa Kell M.D.   On: 11/20/2023 08:47   US Venous Img Lower Bilateral (DVT) Result Date: 11/19/2023 CLINICAL DATA:  Bilateral lower extremity edema worse on the right than the left EXAM: BILATERAL LOWER EXTREMITY VENOUS DOPPLER ULTRASOUND TECHNIQUE: Gray-scale sonography with graded compression, as well as color Doppler and duplex ultrasound were performed to evaluate the lower extremity deep venous systems from the level of the common femoral vein and including the common femoral, femoral, profunda femoral, popliteal and calf veins including the posterior tibial, peroneal and gastrocnemius veins when visible. The superficial great saphenous vein was also interrogated. Spectral Doppler was utilized to evaluate flow at rest and with distal augmentation maneuvers in the common femoral, femoral and popliteal veins. COMPARISON:  None Available. FINDINGS: RIGHT LOWER EXTREMITY Common Femoral Vein: No evidence of thrombus. Normal compressibility, respiratory phasicity and response to augmentation. Saphenofemoral Junction: No evidence of thrombus. Normal compressibility and flow on color Doppler imaging. Profunda Femoral Vein: No evidence of thrombus. Normal compressibility and flow on color Doppler imaging. Femoral Vein: No evidence of thrombus. Normal compressibility, respiratory phasicity and response to augmentation. Popliteal Vein: No evidence of thrombus. Normal compressibility, respiratory phasicity and response to augmentation. Calf Veins: No evidence of thrombus. Normal compressibility and flow on color Doppler imaging. Superficial Great Saphenous Vein: No evidence of thrombus. Normal compressibility. Venous  Reflux:  None. Other Findings:  None. LEFT LOWER EXTREMITY Common Femoral Vein: No evidence of thrombus. Normal compressibility, respiratory phasicity and response to augmentation. Saphenofemoral Junction: No evidence of thrombus. Normal compressibility and flow on color Doppler imaging. Profunda Femoral Vein: No evidence of thrombus. Normal compressibility and flow on color Doppler imaging. Femoral Vein: No evidence of thrombus. Normal compressibility, respiratory phasicity and response to augmentation. Popliteal Vein: No evidence of thrombus. Normal compressibility, respiratory phasicity and response to augmentation. Calf Veins: No evidence of thrombus. Normal compressibility and flow on color Doppler imaging. Superficial Great Saphenous Vein: No evidence of thrombus. Normal compressibility. Venous Reflux:  None. Other Findings:  None. IMPRESSION: No evidence of deep venous thrombosis in either lower extremity. Electronically Signed   By: Malachy Moan M.D.   On: 11/19/2023 08:54   CT CHEST ABDOMEN PELVIS W CONTRAST Result Date: 11/19/2023 CLINICAL DATA:  Sepsis.  Shortness of breath.  Recent pneumonia. EXAM: CT CHEST, ABDOMEN, AND PELVIS WITH CONTRAST TECHNIQUE: Multidetector CT imaging of the chest, abdomen and pelvis was performed following the standard protocol during bolus administration of intravenous contrast. RADIATION DOSE REDUCTION: This exam was performed according to the departmental dose-optimization program which includes automated exposure control, adjustment of the mA and/or kV according to patient size and/or use of iterative reconstruction technique. CONTRAST:  OMNIPAQUE IOHEXOL 300 MG/ML  SOLN COMPARISON:  06/22/2023.  Chest x-ray today. FINDINGS: CT CHEST FINDINGS Cardiovascular: Heart borderline in size. Diffuse coronary artery and moderate aortic atherosclerosis. No evidence of aortic aneurysm or dissection. No filling defects in the pulmonary arteries to suggest pulmonary  emboli. Mediastinum/Nodes: No mediastinal, hilar, or axillary adenopathy. Trachea and esophagus are unremarkable. Thyroid unremarkable. Lungs/Pleura: Small to moderate bilateral pleural effusions. 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 Airspace opacities in the lower lobes. Scattered ground-glass opacities in the lungs could reflect atelectasis or early edema. Musculoskeletal: Chest wall soft tissues are unremarkable. No acute bony abnormality. CT ABDOMEN PELVIS FINDINGS Hepatobiliary: No focal liver abnormality is seen. Status post cholecystectomy. No biliary dilatation. Pancreas: No focal abnormality or ductal dilatation. Spleen: No focal abnormality.  Normal size. Adrenals/Urinary Tract: Bilateral perinephric stranding, stable since prior study. No suspicious renal or adrenal lesion. No stones or hydronephrosis. Urinary bladder unremarkable. Stomach/Bowel: Normal appendix. Stomach, large and small bowel grossly unremarkable. Vascular/Lymphatic: Aortic atherosclerosis. No evidence of aneurysm or adenopathy. Reproductive: Prostate enlargement Other: No free fluid or free air. Musculoskeletal: No acute bony abnormality. IMPRESSION: Small to moderate bilateral pleural effusions. Bilateral lower lobe airspace opacities could reflect atelectasis or pneumonia. Diffuse coronary artery disease.  Aortic atherosclerosis. Borderline heart size. Scattered ground-glass opacities in the lungs could reflect atelectasis or early edema. No acute findings in the abdomen or pelvis. Electronically Signed   By: Charlett Nose M.D.   On: 11/19/2023 00:18   DG Chest Port 1 View Result Date: 11/18/2023 CLINICAL DATA:  SHOB, hx of PNA EXAM: PORTABLE CHEST 1 VIEW COMPARISON:  Chest x-ray 06/22/2023 FINDINGS: The heart and mediastinal contours are unchanged. Atherosclerotic plaque. Prominent hilar vasculature. Question left base atelectasis. Increased interstitial markings. No pleural effusion. No pneumothorax. No acute osseous  abnormality. IMPRESSION: 1. Pulmonary edema. 2. Question left base atelectasis. Recommend further evaluation with PA and lateral view of the chest. 3.  Aortic Atherosclerosis (ICD10-I70.0). Electronically Signed   By: Tish Frederickson M.D.   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 Prefer patient be off Bipap for cath. Discussed with patient, family, and ICU team.  Trops: 110 --> 564 --> 24,000 doubt demand ischemia given very high rise in trops Optimize lytes, K>4 and Mag>2   PAF Continue on heparin gtt Metoprolol as needed for RVR Currently patient is rate controlled   HTN Restart home meds as BP allows   HLD On lipitor 80 mg       Clotilde Dieter, DO 11/20/2023, 1:13 PM [facility] Cardiology

## 2023-11-20 NOTE — Progress Notes (Signed)
 NAME:  Mathew Baker, MRN:  [id number], DOB:  1941-01-23, LOS: 2 ADMISSION DATE:  11/18/2023, CONSULTATION DATE:  11/18/23 REFERRING MD:  Donna Bernard, CHIEF COMPLAINT:  shortness of breath    HPI  83 y.o male with significant PMH of COPD, T2DM, CVA, hyperlipidemia, hypertension, cholecystitis s/p cholecystectomy and hepatocellular carcinoma treated with Y90 who presented to the ED with chief complaints of progressive shortness of breath x 3 days.  Patient reports symptoms worsened today with associated productive cough and generalized weakness so he called EMS.  On EMS arrival patient was hypoxic with sats in the low 80s on room air.  He was treated with DuoNebs,, continuous albuterol nebulizer, magnesium and 125mg  of Solu-Medrol IV.   ED Course: Initial vital signs showed Blood pressure (!) 89/52, pulse 97, temperature 98.3 F (36.8 C), temperature source Oral, resp. rate (!) 21,  and the oxygen saturation 90% on 6L [place]. He was working hard to breath so was placed on BiPAP support.  Pertinent Labs/Diagnostics Findings: Glucose: 345. CO2 17 WBC:11.0 K/L Hgb/Hct: 9.9/32.3 PCT: negative <0.10  Lactic acid:5.9 COVID PCR: Negative,  troponin: 110 BNP: 322.9  WUJ:[id number] edema. Question left base atelectasis  Patient given 30 cc/kg of fluids and started on broad-spectrum antibiotics Ceftriaxone and Azithromycin for suspected sepsis due to pneumonia. Due to high risk for decompensation, PCCM consulted for ICU admission.  Past Medical History  COPD, T2DM, CVA, hyperlipidemia, hypertension, cholecystitis s/p cholecystectomy and  hepatocellular carcinoma treated with Y90  Significant Hospital Events   3/14: Admit with acute hypoxic respiratory failure in setting of AECOPD, questionable pneumonia and bilateral pleural effusion 3/15: Pt on and off Bipap due to shortness of breath.  Repeat troponin >24,000 Cardiology consulted tentative plan for cardiac cath 03/17 if stable from  respiratory standpoint  3/16: Pt c/o nausea followed by vomiting no chest pain.  Remains off levophed gtt map 65 or higher.  Will transfer to stepdown unit and transfer service to [place]   Significant Diagnostic Tests:  3/14: Chest Xray> IMPRESSION: 1. Pulmonary edema. 2. Question left base atelectasis. Recommend further evaluation with PA and lateral view of the chest. 3.  Aortic Atherosclerosis (ICD10-I70.0).  3/14: CT Chest, abdomen and pelvis> MPRESSION: Small to moderate bilateral pleural effusions. Bilateral lower lobe airspace opacities could reflect atelectasis or pneumonia. Diffuse coronary artery disease.  Aortic atherosclerosis. Borderline heart size. Scattered ground-glass opacities in the lungs could reflect atelectasis or early edema. No acute findings in the abdomen or pelvis.    Interim History / Subjective:    As outlined above under significant events   Micro Data:  3/14: SARS-CoV-2 PCR> negative 3/14: Influenza PCR> negative 3/14: Blood culture x2>>NGTD 3/14: MRSA PCR>> negative  3/14: RVP>>negative  3/15: Strep pneumo urinary antigen>>negative  3/15: Legionella urinary antigen>>   Anti-infectives (From admission, onward)    Start     Dose/Rate Route Frequency Ordered Stop   11/19/23 2330  azithromycin (ZITHROMAX) 500 mg in sodium chloride 0.9 % 250 mL IVPB        500 mg 250 mL/hr over 60 Minutes Intravenous Every 24 hours 11/19/23 0100     11/19/23 2230  cefTRIAXone (ROCEPHIN) 1 g in sodium chloride 0.9 % 100 mL IVPB        1 g 200 mL/hr over 30 Minutes Intravenous Every 24 hours 11/19/23 0100     11/18/23 2230  cefTRIAXone (ROCEPHIN) 2 g in sodium chloride 0.9 % 100 mL IVPB        2  g 200 mL/hr over 30 Minutes Intravenous Once 11/18/23 2221 11/18/23 2320   11/18/23 2230  azithromycin (ZITHROMAX) 500 mg in sodium chloride 0.9 % 250 mL IVPB        500 mg 250 mL/hr over 60 Minutes Intravenous  Once 11/18/23 2221 11/19/23 0056        OBJECTIVE   Blood pressure 114/71, pulse 76, temperature 98.2 F (36.8 C), temperature source Oral, resp. rate (!) 0, height 6\' 1"  (1.854 m), weight 109.4 kg, SpO2 98%.  FiO2 (%):  [30 %] 30 %   Intake/Output Summary (Last 24 hours) at 11/20/2023 0934 Last data filed at 11/20/2023 0300 Gross per 24 hour  Intake 882.99 ml  Output 730 ml  Net 152.99 ml   Filed Weights   11/18/23 2122 11/19/23 0242 11/20/23 0500  Weight: 102.1 kg 106.5 kg 109.4 kg   Physical Examination  GENERAL: Acutely-ill appearing male, NAD on nasal canula  HEENT: Head atraumatic, normocephalic. Oropharynx and nasopharynx clear.  No JVD present  LUNGS: Inspiratory wheezes throughout, even, non labored  CARDIOVASCULAR: NSR, no r/g, 2+ radial/1+ distal pulses, trace bilateral lower extremity edema  ABDOMEN: +BS x4, obese soft, non distended, non tender  EXTREMITIES: Normal bulk and tone, moves all extremities  NEUROLOGIC: Alert and oriented, follows commands, PERRLA  SKIN: No obvious rash, lesion, or ulcer. Warm to touch   Labs   CBC: Recent Labs  Lab 11/18/23 2143 11/19/23 0347 11/20/23 0346  WBC 11.0* 9.4 21.6*  NEUTROABS 9.4*  --   --   HGB 9.9* 9.7* 9.7*  HCT 32.3* 31.6* 30.3*  MCV 96.4 95.5 94.1  PLT 221 218 209    Basic Metabolic Panel: Recent Labs  Lab 11/18/23 2143 11/19/23 0347 11/20/23 0346  NA 136 138 137  K 3.9 3.9 4.9  CL 108 105 107  CO2 17* 19* 24  GLUCOSE 345* 392* 160*  BUN 17 22 40*  CREATININE 0.95 1.32* 1.15  CALCIUM 8.3* 8.3* 8.3*  MG  --  2.2 2.3  PHOS  --  3.9 3.1   GFR: Estimated Creatinine Clearance: 63.1 mL/min (by C-G formula based on SCr of 1.15 mg/dL). Recent Labs  Lab 11/18/23 2143 11/18/23 2226 11/18/23 2340 11/19/23 0347 11/19/23 0836 11/19/23 1114 11/20/23 0346  PROCALCITON <0.10  --   --   --   --   --   --   WBC 11.0*  --   --  9.4  --   --  21.6*  LATICACIDVEN  --  5.9* 6.7*  --  6.5* 4.4*  --     Liver Function Tests: Recent Labs  Lab 11/18/23 2143  11/19/23 0836  AST 26 122*  ALT 16 27  ALKPHOS 75 59  BILITOT 0.9 0.6  PROT 6.2* 6.2*  ALBUMIN 3.2* 3.0*   No results for input(s): "LIPASE", "AMYLASE" in the last 168 hours. No results for input(s): "AMMONIA" in the last 168 hours.  ABG    Component Value Date/Time   HCO3 23.8 11/19/2023 1233   ACIDBASEDEF 11.2 (H) 11/19/2023 0059   O2SAT 97.9 11/19/2023 1233     Coagulation Profile: Recent Labs  Lab 11/18/23 2226  INR 1.2    Cardiac Enzymes: No results for input(s): "CKTOTAL", "CKMB", "CKMBINDEX", "TROPONINI" in the last 168 hours.  HbA1C: Hgb A1c MFr Bld  Date/Time Value Ref Range Status  11/18/2023 11:40 PM 9.1 (H) 4.8 - 5.6 % Final    Comment:    (NOTE) Pre diabetes:  5.7%-6.4%  Diabetes:              >6.4%  Glycemic control for   <7.0% adults with diabetes   05/19/2023 11:06 PM 8.7 (H) 4.8 - 5.6 % Final    Comment:    (NOTE)         Prediabetes: 5.7 - 6.4         Diabetes: >6.4         Glycemic control for adults with diabetes: <7.0     CBG: Recent Labs  Lab 11/19/23 1535 11/19/23 1940 11/19/23 2352 11/20/23 0345 11/20/23 0723  GLUCAP 170* 133* 211* 156* 115*    Review of Systems: Positives in BOLD  Gen: Denies fever, chills, weight change, fatigue, night sweats HEENT: Denies blurred vision, double vision, hearing loss, tinnitus, sinus congestion, rhinorrhea, sore throat, neck stiffness, dysphagia PULM: shortness of breath, cough, sputum production, hemoptysis, wheezing CV: Denies chest pain, edema, orthopnea, paroxysmal nocturnal dyspnea, palpitations GI: Denies abdominal pain, nausea, vomiting, diarrhea, hematochezia, melena, constipation, change in bowel habits GU: Denies dysuria, hematuria, polyuria, oliguria, urethral discharge Endocrine: Denies hot or cold intolerance, polyuria, polyphagia or appetite change Derm: Denies rash, dry skin, scaling or peeling skin change Heme: Denies easy bruising, bleeding, bleeding gums Neuro:  Denies headache, numbness, weakness, slurred speech, loss of memory or consciousness  Past Medical History  He,  has a past medical history of Chronic airway obstruction (HCC), Degeneration of intervertebral disc of lumbar region, Diabetes mellitus without complication (HCC), Essential hypertension, benign, Hyperlipidemia, Hypertension, Osteoarthritis of lower extremity, Pneumonia, organism unspecified(486), PSA elevation (07/16/2013), and Stroke (HCC).   Surgical History    Past Surgical History:  Procedure Laterality Date   CATARACT EXTRACTION W/ INTRAOCULAR LENS  IMPLANT, BILATERAL     COLONOSCOPY  2006   Normal: Repeat in 10 yrs   IR PERC CHOLECYSTOSTOMY  05/20/2023   IR RADIOLOGIST EVAL & MGMT  06/22/2023   LOOP RECORDER INSERTION N/A 02/21/2020   Procedure: LOOP RECORDER INSERTION;  Surgeon: Marcina Millard, MD;  Location: [facility] INVASIVE CV LAB;  Service: Cardiovascular;  Laterality: N/A;   LOOP RECORDER REMOVAL N/A 10/23/2020   Procedure: LOOP RECORDER REMOVAL;  Surgeon: Marcina Millard, MD;  Location: [facility] INVASIVE CV LAB;  Service: Cardiovascular;  Laterality: N/A;   TEE WITHOUT CARDIOVERSION N/A 09/15/2020   Procedure: TRANSESOPHAGEAL ECHOCARDIOGRAM (TEE);  Surgeon: Dalia Heading, MD;  Location: [facility] ORS;  Service: Cardiovascular;  Laterality: N/A;     Social History   reports that he quit smoking about 65 years ago. His smoking use included cigarettes. He has never used smokeless tobacco. He reports that he does not drink alcohol and does not use drugs.   Family History   His family history includes Diabetes in his mother.   Allergies No Known Allergies   Home Medications  Prior to Admission medications   Medication Sig Start Date End Date Taking? Authorizing Provider  albuterol (VENTOLIN HFA) 108 (90 Base) MCG/ACT inhaler Inhale 2 puffs into the lungs every 6 (six) hours as needed for wheezing or shortness of breath. 04/28/20   [provider]  Alcohol  Swabs (ALCOHOL WIPES) 70 % PADS USE FOR INSULIN INJECTIONS OR TO FINGER STICK AS DIRECTED BY YOUR MEDICAL PROVIDER 04/28/20   [provider]  ascorbic acid (VITAMIN C) 500 MG tablet Take 1 tablet (500 mg total) by mouth daily. 09/17/20   Drema Dallas, MD  atorvastatin (LIPITOR) 40 MG tablet Take 40 mg by mouth at bedtime. 01/21/22  [provider]  atorvastatin (LIPITOR) 80 MG tablet Take 80 mg by mouth daily.    [provider]  docusate sodium (COLACE) 100 MG capsule Take 100 mg by mouth 2 (two) times daily. 05/02/23   [provider]  ELIQUIS 5 MG TABS tablet Take 5 mg by mouth 2 (two) times daily. 08/18/20   [provider]  empagliflozin (JARDIANCE) 25 MG TABS tablet 1 tablet 1 day or 1 dose. 12/09/20   [provider]  feeding supplement (ENSURE ENLIVE / ENSURE PLUS) LIQD Take 237 mLs by mouth 3 (three) times daily between meals. 05/24/23   Alford Highland, MD  fluconazole (DIFLUCAN) 100 MG tablet Take 1 tablet (100 mg total) by mouth daily. 05/26/23   Alford Highland, MD  furosemide (LASIX) 20 MG tablet Take 20 mg by mouth daily. 10/13/20   [provider]  glucose blood (PRECISION QID TEST) test strip Use to test blood sugar 2 times daily as instructed. Dx: E11.65 08/22/15   [provider]  HYDROcodone-acetaminophen (NORCO/VICODIN) 5-325 MG tablet Take 1 tablet by mouth every 8 (eight) hours as needed. 02/26/21   [provider]  insulin aspart (NOVOLOG) 100 UNIT/ML injection Inject 0-6 Units into the skin 3 (three) times daily as needed for high blood sugar. 05/24/23   Alford Highland, MD  insulin glargine (LANTUS) 100 UNIT/ML injection Inject 0.3 mLs (30 Units total) into the skin 2 (two) times daily. 05/24/23   Alford Highland, MD  Insulin Pen Needle (PEN NEEDLES 29GX1/2") 29G X MISC Use 1x a day 08/21/14   [provider]  ipratropium (ATROVENT HFA) 17 MCG/ACT inhaler Inhale 2 puffs into the lungs  every 6 (six) hours. 09/16/20   Drema Dallas, MD  latanoprost (XALATAN) 0.005 % ophthalmic solution Place 1 drop into both eyes at bedtime.    [provider]  lisinopril (ZESTRIL) 5 MG tablet Take 5 mg by mouth daily. 03/03/23   [provider]  Magnesium Oxide 420 MG TABS Take 1 tablet by mouth daily. 10/31/20   [provider]  pantoprazole (PROTONIX) 40 MG tablet Take 1 tablet (40 mg total) by mouth daily. 05/25/23   Alford Highland, MD  sildenafil (VIAGRA) 100 MG tablet Take 1 tablet by mouth as needed. 12/08/20   [provider]  tamsulosin (FLOMAX) 0.4 MG CAPS capsule Take 0.4 mg by mouth at bedtime.    [provider]  vitamin B-12 (CYANOCOBALAMIN) 100 MCG tablet Take 100 mcg by mouth daily.    [provider]  Scheduled Meds:  aspirin EC  81 mg Oral Daily   atorvastatin  80 mg Oral Daily   Chlorhexidine Gluconate Cloth  6 each Topical Daily   furosemide  40 mg Intravenous Once   insulin aspart  0-20 Units Subcutaneous Q4H   insulin glargine  10 Units Subcutaneous Daily   ipratropium-albuterol  3 mL Nebulization Q6H   mouth rinse  15 mL Mouth Rinse 4 times per day   pantoprazole (PROTONIX) IV  40 mg Intravenous Q24H   Continuous Infusions:  azithromycin Stopped (11/19/23 2347)   cefTRIAXone (ROCEPHIN)  IV Stopped (11/19/23 2156)   heparin 1,300 Units/hr (11/20/23 0300)   norepinephrine (LEVOPHED) Adult infusion Stopped (11/19/23 0500)   PRN Meds:.docusate sodium, ipratropium-albuterol, morphine injection, ondansetron (ZOFRAN) IV, mouth rinse, polyethylene glycol   Active Hospital Problem list   See systems below  Assessment & Plan:  #Acute hypoxic respiratory failure #Acute exacerbation of COPD #Pneumonia #Pulmonary edema  #Bilateral pleural  effusion Background 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 Bilateral lower lobe airspace opacities could reflect atelectasis or pneumonia. Diffuse coronary artery disease.  Aortic atherosclerosis. Borderline heart size. Scattered ground-glass opacities in the lungs could reflect atelectasis or early edema - Supplemental O2 or Bipap prn for dyspnea and/or hypoxia   - Maintain O2 sats 88% to 92% - Follow intermittent CXR & ABG as needed - Scheduled and prn bronchodilator therapy  - IV lasix x1 dose  - Trend WBC and monitor fever curve  - Trend PCT  - Follow cultures  - Continue abx as outlined above pending culture results and sensitivities   #NSTEMI  #Hypotension: suspect secondary to sepsis  #PAF Hx: HLD, essential HTN and stroke - Continuous telemetry monitoring  - Troponin >24,000 - Cardiology consulted appreciate input: tentatively scheduled for cardiac cath 03/17 if stable from a respiratory standpoint  - Echo results pending - Aspirin and atorvastatin  - Continue heparin gtt: dosing per pharmacy  - Hold outpatient antihypertensives  - No longer requiring levophed gtt  - 40 mg IV lasix x1 dose   #Acute kidney injury suspect secondary to ATN~resolved  #Lactic acidosis~resolved  #Non anion gap metabolic acidosis~resolved  - Trend BMP, vbg, and lactic acid  - Replace electrolytes as indicated  - Strict I&O's  - Avoid nephrotoxic agents as able    #Hepatocellular Carcinoma (HCC)  - Treated with Y90 in October 2023.  - Follows with [facility]  #Type II diabetes mellitus  - CBG's ac/hs - SSI and scheduled lantus  - Follow hyper/hypoglycemic protocol  - Target CBG range 140 to 180   Best practice:  Diet: Regular Consistency  Pain/Anxiety/Delirium protocol (if indicated): Prn morphine for pain  VAP protocol (if indicated): Not indicated DVT prophylaxis: Systemic AC GI prophylaxis: PPI Glucose control:  SSI Yes Central venous access:  N/A Arterial line:  N/A Foley:  N/A Mobility:  bed rest  PT consulted:  N/A Last date of multidisciplinary goals of care discussion [11/20/2023] Code Status:  full code Disposition: Stepdown   03/16: Pt updated at bedside regarding plan of care and all questions answered   Critical care time: 40 minutes     Zada Girt, AGNP  Pulmonary/Critical Care Pager [phone] (please enter 7 digits) PCCM Consult Pager [phone] (please enter 7 digits)

## 2023-11-20 NOTE — Progress Notes (Signed)
 PHARMACY - ANTICOAGULATION CONSULT NOTE  Pharmacy Consult for Heparin  Indication: atrial fibrillation  No Known Allergies  Patient Measurements: Height: 6\' 1"  (185.4 cm) Weight: 109.4 kg (241 lb 2.9 oz) IBW/kg (Calculated) : 79.9 Heparin Dosing Weight: 100.5 kg   Vital Signs: Temp: 98.5 F (36.9 C) (03/16 0800) Temp Source: Oral (03/16 0800) BP: 127/66 (03/16 1300) Pulse Rate: 82 (03/16 1300)  Labs: Recent Labs    11/18/23 2143 11/18/23 2143 11/18/23 2226 11/18/23 2340 11/19/23 0347 11/19/23 0836 11/19/23 1603 11/20/23 0036 11/20/23 0346 11/20/23 0842 11/20/23 1815  HGB 9.9*  --   --   --  9.7*  --   --   --  9.7*  --   --   HCT 32.3*  --   --   --  31.6*  --   --   --  30.3*  --   --   PLT 221  --   --   --  218  --   --   --  209  --   --   APTT  --    < > 28  --   --  102*   < > 80*  --  63* 73*  LABPROT  --   --  15.7*  --   --   --   --   --   --   --   --   INR  --   --  1.2  --   --   --   --   --   --   --   --   HEPARINUNFRC  --   --  0.90*  --   --  >1.10*  --   --   --  0.89*  --   CREATININE 0.95  --   --   --  1.32*  --   --   --  1.15  --   --   TROPONINIHS 110*  --   --  546*  --  >24,000*  --   --   --   --   --    < > = values in this interval not displayed.    Estimated Creatinine Clearance: 63.1 mL/min (by C-G formula based on SCr of 1.15 mg/dL).   Medical History: Past Medical History:  Diagnosis Date   Chronic airway obstruction (HCC)    Degeneration of intervertebral disc of lumbar region    Diabetes mellitus without complication (HCC)    Essential hypertension, benign    Hyperlipidemia    Hypertension    Osteoarthritis of lower extremity    Pneumonia, organism unspecified(486)    PSA elevation 07/16/2013   Normal   Stroke (HCC)     Medications:  Medications Prior to Admission  Medication Sig Dispense Refill Last Dose/Taking   albuterol (VENTOLIN HFA) 108 (90 Base) MCG/ACT inhaler Inhale 2 puffs into the lungs every 6 (six)  hours as needed for wheezing or shortness of breath.      Alcohol Swabs (ALCOHOL WIPES) 70 % PADS USE FOR INSULIN INJECTIONS OR TO FINGER STICK AS DIRECTED BY YOUR MEDICAL PROVIDER      ascorbic acid (VITAMIN C) 500 MG tablet Take 1 tablet (500 mg total) by mouth daily. 30 tablet 0    atorvastatin (LIPITOR) 40 MG tablet Take 40 mg by mouth at bedtime.      atorvastatin (LIPITOR) 80 MG tablet Take 80 mg by mouth daily.      docusate sodium (COLACE)  100 MG capsule Take 100 mg by mouth 2 (two) times daily.      ELIQUIS 5 MG TABS tablet Take 5 mg by mouth 2 (two) times daily.      empagliflozin (JARDIANCE) 25 MG TABS tablet 1 tablet 1 day or 1 dose.      feeding supplement (ENSURE ENLIVE / ENSURE PLUS) LIQD Take 237 mLs by mouth 3 (three) times daily between meals. 21330 mL 0    fluconazole (DIFLUCAN) 100 MG tablet Take 1 tablet (100 mg total) by mouth daily. 5 tablet 0    furosemide (LASIX) 20 MG tablet Take 20 mg by mouth daily.      glucose blood (PRECISION QID TEST) test strip Use to test blood sugar 2 times daily as instructed. Dx: E11.65      HYDROcodone-acetaminophen (NORCO/VICODIN) 5-325 MG tablet Take 1 tablet by mouth every 8 (eight) hours as needed.      insulin aspart (NOVOLOG) 100 UNIT/ML injection Inject 0-6 Units into the skin 3 (three) times daily as needed for high blood sugar.      insulin glargine (LANTUS) 100 UNIT/ML injection Inject 0.3 mLs (30 Units total) into the skin 2 (two) times daily.      Insulin Pen Needle (PEN NEEDLES 29GX1/2") 29G X MISC Use 1x a day      ipratropium (ATROVENT HFA) 17 MCG/ACT inhaler Inhale 2 puffs into the lungs every 6 (six) hours. 1 each 0    latanoprost (XALATAN) 0.005 % ophthalmic solution Place 1 drop into both eyes at bedtime.      lisinopril (ZESTRIL) 5 MG tablet Take 5 mg by mouth daily.      Magnesium Oxide 420 MG TABS Take 1 tablet by mouth daily.      pantoprazole (PROTONIX) 40 MG tablet Take 1 tablet (40 mg total) by mouth daily. 30  tablet 0    sildenafil (VIAGRA) 100 MG tablet Take 1 tablet by mouth as needed.      tamsulosin (FLOMAX) 0.4 MG CAPS capsule Take 0.4 mg by mouth at bedtime.      vitamin B-12 (CYANOCOBALAMIN) 100 MCG tablet Take 100 mcg by mouth daily.       Assessment: Pharmacy consulted to dose heparin for Afib in this 83 year old male admitted with resp failure.  Pt was on Eliquis 5 mg PO BID PTA.  Last dose on 3/14 @ 0600.   Baseline HL (3/14 @ 2226) = 0.9, elevated from Eliquis CrCl = 74 ml/min   Goal of Therapy:  Heparin level 0.3-0.7 units/ml aPTT 66 - 102 seconds Monitor platelets by anticoagulation protocol: Yes  Date/Time: aPTT/HL: Comment/Rate: 3/15@0836  102/>1.1 Therapeutic@1500  units/hr 3/15@1603  116/--  SUPRA-therapeutic @ 1500 units/hr 3/16@0036   80/--   Therapeutic @ 1300 units/hr 3/16@0842  63/0.89 Subtherapeutic w/ levels not correlating 3/16@1815  73/--  Therapeutic @1500  units/hr  Plan:  - Continue heparin drip rate at 1500 units/hr - Recheck aPTT in 8 hrs  - Continue to monitor H&H and platelets - Will use aPTT to guide dosing until correlating with HL - CBC daily  Merryl Hacker, PharmD Clinical Pharmacist  11/20/2023,6:34 PM

## 2023-11-20 NOTE — Consult Note (Signed)
 PHARMACY CONSULT NOTE - ELECTROLYTES  Pharmacy Consult for Electrolyte Monitoring and Replacement   Recent Labs: Height: 6\' 1"  (185.4 cm) Weight: 109.4 kg (241 lb 2.9 oz) IBW/kg (Calculated) : 79.9 Estimated Creatinine Clearance: 63.1 mL/min (by C-G formula based on SCr of 1.15 mg/dL). Potassium (mmol/L)  Date Value  11/20/2023 4.9   Magnesium (mg/dL)  Date Value  40/98/1191 2.3   Calcium (mg/dL)  Date Value  47/82/9562 8.3 (L)   Albumin (g/dL)  Date Value  13/04/6577 3.0 (L)   Phosphorus (mg/dL)  Date Value  46/96/2952 3.1   Sodium (mmol/L)  Date Value  11/20/2023 137  07/15/2014 136 (A)   Corrected Ca: 8.94 mg/dL  Assessment  Mathew Baker is a 83 y.o. male presenting with shortness of breath. PMH significant for COPD, T2DM, CVA, hyperlipidemia, hypertension, cholecystitis s/p cholecystectomy and hepatocellular carcinoma. Pharmacy has been consulted to monitor and replace electrolytes.  Diet: NPO MIVF: LR @ 150 mL/hr Pertinent medications: N/A  Goal of Therapy: Electrolytes WNL  Plan:  No replacement currently indicated Check BMP, Mg, Phos with AM labs  Thank you for allowing pharmacy to be a part of this patient's care.  Bettey Costa, PharmD Clinical Pharmacist 11/20/2023 8:49 AM

## 2023-11-20 NOTE — Progress Notes (Signed)
 PHARMACY - ANTICOAGULATION CONSULT NOTE  Pharmacy Consult for Heparin  Indication: atrial fibrillation  No Known Allergies  Patient Measurements: Height: 6\' 1"  (185.4 cm) Weight: 109.4 kg (241 lb 2.9 oz) IBW/kg (Calculated) : 79.9 Heparin Dosing Weight: 100.5 kg   Vital Signs: Temp: 98.2 F (36.8 C) (03/16 0000) Temp Source: Oral (03/16 0000) BP: 114/71 (03/16 0600) Pulse Rate: 76 (03/16 0600)  Labs: Recent Labs    11/18/23 2143 11/18/23 2143 11/18/23 2226 11/18/23 2340 11/19/23 0347 11/19/23 0836 11/19/23 1603 11/20/23 0036 11/20/23 0346 11/20/23 0842  HGB 9.9*  --   --   --  9.7*  --   --   --  9.7*  --   HCT 32.3*  --   --   --  31.6*  --   --   --  30.3*  --   PLT 221  --   --   --  218  --   --   --  209  --   APTT  --    < > 28  --   --  102* 116* 80*  --  63*  LABPROT  --   --  15.7*  --   --   --   --   --   --   --   INR  --   --  1.2  --   --   --   --   --   --   --   HEPARINUNFRC  --   --  0.90*  --   --  >1.10*  --   --   --  0.89*  CREATININE 0.95  --   --   --  1.32*  --   --   --  1.15  --   TROPONINIHS 110*  --   --  546*  --  >24,000*  --   --   --   --    < > = values in this interval not displayed.    Estimated Creatinine Clearance: 63.1 mL/min (by C-G formula based on SCr of 1.15 mg/dL).   Medical History: Past Medical History:  Diagnosis Date   Chronic airway obstruction (HCC)    Degeneration of intervertebral disc of lumbar region    Diabetes mellitus without complication (HCC)    Essential hypertension, benign    Hyperlipidemia    Hypertension    Osteoarthritis of lower extremity    Pneumonia, organism unspecified(486)    PSA elevation 07/16/2013   Normal   Stroke (HCC)     Medications:  Medications Prior to Admission  Medication Sig Dispense Refill Last Dose/Taking   albuterol (VENTOLIN HFA) 108 (90 Base) MCG/ACT inhaler Inhale 2 puffs into the lungs every 6 (six) hours as needed for wheezing or shortness of breath.       Alcohol Swabs (ALCOHOL WIPES) 70 % PADS USE FOR INSULIN INJECTIONS OR TO FINGER STICK AS DIRECTED BY YOUR MEDICAL PROVIDER      ascorbic acid (VITAMIN C) 500 MG tablet Take 1 tablet (500 mg total) by mouth daily. 30 tablet 0    atorvastatin (LIPITOR) 40 MG tablet Take 40 mg by mouth at bedtime.      atorvastatin (LIPITOR) 80 MG tablet Take 80 mg by mouth daily.      docusate sodium (COLACE) 100 MG capsule Take 100 mg by mouth 2 (two) times daily.      ELIQUIS 5 MG TABS tablet Take 5 mg by mouth 2 (two) times  daily.      empagliflozin (JARDIANCE) 25 MG TABS tablet 1 tablet 1 day or 1 dose.      feeding supplement (ENSURE ENLIVE / ENSURE PLUS) LIQD Take 237 mLs by mouth 3 (three) times daily between meals. 21330 mL 0    fluconazole (DIFLUCAN) 100 MG tablet Take 1 tablet (100 mg total) by mouth daily. 5 tablet 0    furosemide (LASIX) 20 MG tablet Take 20 mg by mouth daily.      glucose blood (PRECISION QID TEST) test strip Use to test blood sugar 2 times daily as instructed. Dx: E11.65      HYDROcodone-acetaminophen (NORCO/VICODIN) 5-325 MG tablet Take 1 tablet by mouth every 8 (eight) hours as needed.      insulin aspart (NOVOLOG) 100 UNIT/ML injection Inject 0-6 Units into the skin 3 (three) times daily as needed for high blood sugar.      insulin glargine (LANTUS) 100 UNIT/ML injection Inject 0.3 mLs (30 Units total) into the skin 2 (two) times daily.      Insulin Pen Needle (PEN NEEDLES 29GX1/2") 29G X MISC Use 1x a day      ipratropium (ATROVENT HFA) 17 MCG/ACT inhaler Inhale 2 puffs into the lungs every 6 (six) hours. 1 each 0    latanoprost (XALATAN) 0.005 % ophthalmic solution Place 1 drop into both eyes at bedtime.      lisinopril (ZESTRIL) 5 MG tablet Take 5 mg by mouth daily.      Magnesium Oxide 420 MG TABS Take 1 tablet by mouth daily.      pantoprazole (PROTONIX) 40 MG tablet Take 1 tablet (40 mg total) by mouth daily. 30 tablet 0    sildenafil (VIAGRA) 100 MG tablet Take 1 tablet  by mouth as needed.      tamsulosin (FLOMAX) 0.4 MG CAPS capsule Take 0.4 mg by mouth at bedtime.      vitamin B-12 (CYANOCOBALAMIN) 100 MCG tablet Take 100 mcg by mouth daily.       Assessment: Pharmacy consulted to dose heparin for Afib in this 83 year old male admitted with resp failure.  Pt was on Eliquis 5 mg PO BID PTA.  Last dose on 3/14 @ 0600.   Baseline HL (3/14 @ 2226) = 0.9, elevated from Eliquis CrCl = 74 ml/min   Goal of Therapy:  Heparin level 0.3-0.7 units/ml aPTT 66 - 102 seconds Monitor platelets by anticoagulation protocol: Yes  Date/Time: aPTT/HL: Comment/Rate: 3/15@0836  102/>1.1 Therapeutic@1500  units/hr 3/15@1603  116/--  SUPRA-therapeutic @ 1500 units/hr 3/16@0036   80/--   Therapeutic @ 1300 units/hr 3/16@0842  63/0.89 Subtherapeutic w/ levels not correlating  Plan:  - Bolus 1500 units x 1 - Increase heparin drip rate to 1500 units/hr - Recheck aPTT in 8 hrs  - Continue to monitor H&H and platelets - Will use aPTT to guide dosing until correlating with HL - CBC daily  Bettey Costa, PharmD Clinical Pharmacist  11/20/2023,9:52 AM

## 2023-11-20 NOTE — Plan of Care (Signed)
  Problem: Education: Goal: Ability to describe self-care measures that may prevent or decrease complications (Diabetes Survival Skills Education) will improve Outcome: Progressing   Problem: Coping: Goal: Ability to adjust to condition or change in health will improve Outcome: Progressing   Problem: Fluid Volume: Goal: Ability to maintain a balanced intake and output will improve Outcome: Progressing   Problem: Health Behavior/Discharge Planning: Goal: Ability to identify and utilize available resources and services will improve Outcome: Progressing Goal: Ability to manage health-related needs will improve Outcome: Progressing   Problem: Metabolic: Goal: Ability to maintain appropriate glucose levels will improve Outcome: Progressing   Problem: Nutritional: Goal: Maintenance of adequate nutrition will improve Outcome: Progressing Goal: Progress toward achieving an optimal weight will improve Outcome: Progressing   Problem: Skin Integrity: Goal: Risk for impaired skin integrity will decrease Outcome: Progressing   Problem: Tissue Perfusion: Goal: Adequacy of tissue perfusion will improve Outcome: Progressing   Problem: Education: Goal: Knowledge of General Education information will improve Description: Including pain rating scale, medication(s)/side effects and non-pharmacologic comfort measures Outcome: Progressing   Problem: Health Behavior/Discharge Planning: Goal: Ability to manage health-related needs will improve Outcome: Progressing   Problem: Clinical Measurements: Goal: Ability to maintain clinical measurements within normal limits will improve Outcome: Progressing Goal: Will remain free from infection Outcome: Progressing Goal: Diagnostic test results will improve Outcome: Progressing Goal: Respiratory complications will improve Outcome: Progressing Goal: Cardiovascular complication will be avoided Outcome: Progressing   Problem: Activity: Goal:  Risk for activity intolerance will decrease Outcome: Progressing   Problem: Nutrition: Goal: Adequate nutrition will be maintained Outcome: Progressing   Problem: Coping: Goal: Level of anxiety will decrease Outcome: Progressing   Problem: Elimination: Goal: Will not experience complications related to bowel motility Outcome: Progressing Goal: Will not experience complications related to urinary retention Outcome: Progressing   Problem: Pain Managment: Goal: General experience of comfort will improve and/or be controlled Outcome: Progressing   Problem: Safety: Goal: Ability to remain free from injury will improve Outcome: Progressing   Problem: Skin Integrity: Goal: Risk for impaired skin integrity will decrease Outcome: Progressing   Problem: Education: Goal: Understanding of CV disease, CV risk reduction, and recovery process will improve Outcome: Progressing Goal: Individualized Educational Video(s) Outcome: Progressing   Problem: Activity: Goal: Ability to return to baseline activity level will improve Outcome: Progressing   Problem: Cardiovascular: Goal: Ability to achieve and maintain adequate cardiovascular perfusion will improve Outcome: Progressing Goal: Vascular access site(s) Level 0-1 will be maintained Outcome: Progressing   Problem: Health Behavior/Discharge Planning: Goal: Ability to safely manage health-related needs after discharge will improve Outcome: Progressing

## 2023-11-21 ENCOUNTER — Encounter
Admission: EM | Disposition: A | Discharge status: Other Acute Inpatient Hospital | Payer: Self-pay | Source: Home / Self Care | Attending: Pulmonary Disease

## 2023-11-21 ENCOUNTER — Encounter: Payer: Self-pay | Admitting: Internal Medicine

## 2023-11-21 DIAGNOSIS — I5023 Acute on chronic systolic (congestive) heart failure: Secondary | ICD-10-CM | POA: Diagnosis not present

## 2023-11-21 DIAGNOSIS — J449 Chronic obstructive pulmonary disease, unspecified: Secondary | ICD-10-CM

## 2023-11-21 DIAGNOSIS — I214 Non-ST elevation (NSTEMI) myocardial infarction: Secondary | ICD-10-CM

## 2023-11-21 DIAGNOSIS — E663 Overweight: Secondary | ICD-10-CM

## 2023-11-21 DIAGNOSIS — J9601 Acute respiratory failure with hypoxia: Secondary | ICD-10-CM | POA: Diagnosis not present

## 2023-11-21 DIAGNOSIS — I480 Paroxysmal atrial fibrillation: Secondary | ICD-10-CM

## 2023-11-21 DIAGNOSIS — E1165 Type 2 diabetes mellitus with hyperglycemia: Secondary | ICD-10-CM | POA: Diagnosis not present

## 2023-11-21 HISTORY — PX: LEFT HEART CATH: CATH118248

## 2023-11-21 LAB — URINALYSIS, COMPLETE (UACMP) WITH MICROSCOPIC
Bacteria, UA: NONE SEEN
Bilirubin Urine: NEGATIVE
Glucose, UA: NEGATIVE mg/dL
Hgb urine dipstick: NEGATIVE
Ketones, ur: NEGATIVE mg/dL
Leukocytes,Ua: NEGATIVE
Nitrite: NEGATIVE
Protein, ur: NEGATIVE mg/dL
Specific Gravity, Urine: 1.021 (ref 1.005–1.030)
pH: 5 (ref 5.0–8.0)

## 2023-11-21 LAB — CBC
HCT: 31.3 % — ABNORMAL LOW (ref 39.0–52.0)
Hemoglobin: 9.8 g/dL — ABNORMAL LOW (ref 13.0–17.0)
MCH: 30.6 pg (ref 26.0–34.0)
MCHC: 31.3 g/dL (ref 30.0–36.0)
MCV: 97.8 fL (ref 80.0–100.0)
Platelets: 218 10*3/uL (ref 150–400)
RBC: 3.2 MIL/uL — ABNORMAL LOW (ref 4.22–5.81)
RDW: 17.8 % — ABNORMAL HIGH (ref 11.5–15.5)
WBC: 15.3 10*3/uL — ABNORMAL HIGH (ref 4.0–10.5)
nRBC: 0.1 % (ref 0.0–0.2)

## 2023-11-21 LAB — ECHOCARDIOGRAM COMPLETE
AV Mean grad: 21 mmHg
AV Peak grad: 34.8 mmHg
Ao pk vel: 2.95 m/s
Height: 73 in
S' Lateral: 5 cm
Weight: 3756.64 [oz_av]

## 2023-11-21 LAB — BASIC METABOLIC PANEL
Anion gap: 8 *Deleted (ref 5–15)
Anion gap: 8 (ref 5–15)
BUN: 47 mg/dL — ABNORMAL HIGH (ref 8–23)
BUN: 51 mg/dL — ABNORMAL HIGH (ref 8–23)
CO2: 23 mmol/L (ref 22–32)
CO2: 27 mmol/L (ref 22–32)
Calcium: 7 mg/dL — ABNORMAL LOW (ref 8.9–10.3)
Calcium: 8.1 mg/dL — ABNORMAL LOW (ref 8.9–10.3)
Chloride: 89 mmol/L — ABNORMAL LOW (ref 98–111)
Chloride: 104 mmol/L (ref 98–111)
Creatinine, Ser: 0.91 mg/dL (ref 0.61–1.24)
Creatinine, Ser: 1.08 mg/dL (ref 0.61–1.24)
GFR, Estimated: >60 mL/min (ref >60)
GFR, Estimated: >60 mL/min (ref >60)
Glucose, Bld: 147 mg/dL — ABNORMAL HIGH (ref 70–99)
Glucose, Bld: 168 mg/dL — ABNORMAL HIGH (ref 70–99)
Potassium: 4.8 mmol/L (ref 3.5–5.1)
Potassium: 4.7 mmol/L (ref 3.5–5.1)
Sodium: 139 mmol/L (ref 135–145)

## 2023-11-21 LAB — APTT
aPTT: 87 s — ABNORMAL HIGH (ref 24–36)
aPTT: 97 s — ABNORMAL HIGH (ref 24–36)

## 2023-11-21 LAB — LIPID PANEL
Cholesterol: 87 mg/dL (ref 0–200)
HDL: 48 mg/dL (ref >40)
LDL Cholesterol: 26 mg/dL (ref 0–99)
Total CHOL/HDL Ratio: 1.8 ratio
Triglycerides: 63 mg/dL (ref <150)
VLDL: 13 mg/dL (ref 0–40)

## 2023-11-21 LAB — GLUCOSE, CAPILLARY
Glucose-Capillary: 166 mg/dL — ABNORMAL HIGH (ref 70–99)
Glucose-Capillary: 173 mg/dL — ABNORMAL HIGH (ref 70–99)
Glucose-Capillary: 208 mg/dL — ABNORMAL HIGH (ref 70–99)
Glucose-Capillary: 205 mg/dL — ABNORMAL HIGH (ref 70–99)
Glucose-Capillary: 183 mg/dL — ABNORMAL HIGH (ref 70–99)
Glucose-Capillary: 190 mg/dL — ABNORMAL HIGH (ref 70–99)

## 2023-11-21 LAB — LEGIONELLA PNEUMOPHILA SEROGP 1 UR AG: L. pneumophila Serogp 1 Ur Ag: NEGATIVE

## 2023-11-21 LAB — MAGNESIUM: Magnesium: 2.1 mg/dL (ref 1.7–2.4)

## 2023-11-21 LAB — HEPARIN LEVEL (UNFRACTIONATED): Heparin Unfractionated: 0.77 [IU]/mL — ABNORMAL HIGH (ref 0.30–0.70)

## 2023-11-21 LAB — PHOSPHORUS: Phosphorus: 2.2 mg/dL — ABNORMAL LOW (ref 2.5–4.6)

## 2023-11-21 LAB — CARDIAC CATHETERIZATION: Cath EF Quantitative: 30 %

## 2023-11-21 LAB — SODIUM, URINE, RANDOM: Sodium, Ur: 24 mmol/L

## 2023-11-21 LAB — OSMOLALITY: Osmolality: 313 mosm/kg — ABNORMAL HIGH (ref 275–295)

## 2023-11-21 SURGERY — LEFT HEART CATH
Anesthesia: Moderate Sedation

## 2023-11-21 MED ORDER — ASPIRIN 81 MG PO TBEC
81.0000 mg | DELAYED_RELEASE_TABLET | Freq: Every day | ORAL | Status: DC
  Administered 2023-11-22: 81 mg via ORAL
  Filled 2023-11-21: qty 1

## 2023-11-21 MED ORDER — IOHEXOL 300 MG/ML  SOLN
INTRAMUSCULAR | Status: DC | PRN
  Administered 2023-11-21: 76 mL

## 2023-11-21 MED ORDER — FUROSEMIDE 10 MG/ML IJ SOLN: 40.0000 mg | Freq: Two times a day (BID) | INTRAMUSCULAR | Status: DC

## 2023-11-21 MED ORDER — ONDANSETRON HCL 4 MG/2ML IJ SOLN: 4.0000 mg | Freq: Four times a day (QID) | INTRAMUSCULAR | Status: DC | PRN

## 2023-11-21 MED ORDER — CARVEDILOL 3.125 MG PO TABS: 3.1250 mg | ORAL_TABLET | Freq: Two times a day (BID) | ORAL | Status: DC

## 2023-11-21 MED ORDER — FUROSEMIDE 10 MG/ML IJ SOLN
40.0000 mg | Freq: Two times a day (BID) | INTRAMUSCULAR | Status: DC
  Administered 2023-11-21 – 2023-11-22 (×3): 40 mg via INTRAVENOUS
  Filled 2023-11-21 (×3): qty 4

## 2023-11-21 MED ORDER — HEPARIN (PORCINE) IN NACL 1000-0.9 UT/500ML-% IV SOLN
INTRAVENOUS | Status: DC | PRN
  Administered 2023-11-21: 1000 mL

## 2023-11-21 MED ORDER — FENTANYL CITRATE (PF) 100 MCG/2ML IJ SOLN
INTRAMUSCULAR | Status: AC
  Filled 2023-11-21: qty 2

## 2023-11-21 MED ORDER — AZITHROMYCIN 250 MG PO TABS: ORAL_TABLET | ORAL | Status: DC

## 2023-11-21 MED ORDER — LISINOPRIL 10 MG PO TABS
5.0000 mg | ORAL_TABLET | Freq: Every day | ORAL | Status: DC
Start: 2023-11-21 — End: 2023-11-21

## 2023-11-21 MED ORDER — LABETALOL HCL 5 MG/ML IV SOLN: 10.0000 mg | INTRAVENOUS | Status: AC | PRN

## 2023-11-21 MED ORDER — LIDOCAINE HCL 1 % IJ SOLN
INTRAMUSCULAR | Status: AC
  Filled 2023-11-21: qty 20

## 2023-11-21 MED ORDER — CARVEDILOL 3.125 MG PO TABS
3.1250 mg | ORAL_TABLET | Freq: Two times a day (BID) | ORAL | Status: DC
  Administered 2023-11-21 – 2023-11-22 (×2): 3.125 mg via ORAL
  Filled 2023-11-21 (×2): qty 1

## 2023-11-21 MED ORDER — HEPARIN SODIUM (PORCINE) 1000 UNIT/ML IJ SOLN
INTRAMUSCULAR | Status: DC | PRN
  Administered 2023-11-21: 4000 [IU] via INTRAVENOUS

## 2023-11-21 MED ORDER — AZITHROMYCIN 250 MG PO TABS
250.0000 mg | ORAL_TABLET | Freq: Every evening | ORAL | Status: DC
  Administered 2023-11-21: 250 mg via ORAL
  Filled 2023-11-21: qty 1

## 2023-11-21 MED ORDER — IPRATROPIUM-ALBUTEROL 0.5-2.5 (3) MG/3ML IN SOLN: 3.0000 mL | RESPIRATORY_TRACT | Status: DC

## 2023-11-21 MED ORDER — MIDAZOLAM HCL 2 MG/2ML IJ SOLN
INTRAMUSCULAR | Status: AC
  Filled 2023-11-21: qty 2

## 2023-11-21 MED ORDER — HEPARIN SODIUM (PORCINE) 1000 UNIT/ML IJ SOLN
INTRAMUSCULAR | Status: AC
  Filled 2023-11-21: qty 10

## 2023-11-21 MED ORDER — VERAPAMIL HCL 2.5 MG/ML IV SOLN
INTRAVENOUS | Status: AC
  Filled 2023-11-21: qty 2

## 2023-11-21 MED ORDER — LIDOCAINE HCL (PF) 1 % IJ SOLN
INTRAMUSCULAR | Status: DC | PRN
  Administered 2023-11-21: 2 mL

## 2023-11-21 MED ORDER — SODIUM CHLORIDE 0.9% FLUSH
3.0000 mL | Freq: Two times a day (BID) | INTRAVENOUS | Status: DC
  Administered 2023-11-21 – 2023-11-22 (×2): 3 mL via INTRAVENOUS

## 2023-11-21 MED ORDER — HEPARIN (PORCINE) 25000 UT/250ML-% IV SOLN: 1500.0000 [IU]/h | INTRAVENOUS | Status: DC

## 2023-11-21 MED ORDER — INSULIN GLARGINE 100 UNIT/ML ~~LOC~~ SOLN: 10.0000 [IU] | Freq: Every day | SUBCUTANEOUS | Status: DC

## 2023-11-21 MED ORDER — ASPIRIN 81 MG PO TBEC: 81.0000 mg | DELAYED_RELEASE_TABLET | Freq: Every day | ORAL | Status: DC

## 2023-11-21 MED ORDER — POLYETHYLENE GLYCOL 3350 17 G PO PACK: 17.0000 g | PACK | Freq: Every day | ORAL | Status: DC | PRN

## 2023-11-21 MED ORDER — VERAPAMIL HCL 2.5 MG/ML IV SOLN
INTRAVENOUS | Status: DC | PRN
  Administered 2023-11-21: 2.5 mg via INTRA_ARTERIAL

## 2023-11-21 MED ORDER — HEPARIN (PORCINE) IN NACL 1000-0.9 UT/500ML-% IV SOLN
INTRAVENOUS | Status: AC
  Filled 2023-11-21: qty 1000

## 2023-11-21 MED ORDER — EMPAGLIFLOZIN 25 MG PO TABS
25.0000 mg | ORAL_TABLET | ORAL | Status: DC
  Administered 2023-11-21 – 2023-11-22 (×2): 25 mg via ORAL
  Filled 2023-11-21 (×2): qty 1

## 2023-11-21 SURGICAL SUPPLY — 11 items
CATH 5FR JL3.5 JR4 ANG PIG MP (CATHETERS) IMPLANT
DEVICE RAD TR BAND REGULAR (VASCULAR PRODUCTS) IMPLANT
DRAPE BRACHIAL (DRAPES) IMPLANT
GLIDESHEATH SLEND SS 6F .021 (SHEATH) IMPLANT
GUIDEWIRE INQWIRE 1.5J.035X260 (WIRE) IMPLANT
INQWIRE 1.5J .035X260CM (WIRE) ×1 IMPLANT
KIT SYRINGE INJ CVI SPIKEX1 (MISCELLANEOUS) IMPLANT
PACK CARDIAC CATH (CUSTOM PROCEDURE TRAY) ×1 IMPLANT
PROTECTION STATION PRESSURIZED (MISCELLANEOUS) ×1 IMPLANT
SET ATX-X65L (MISCELLANEOUS) IMPLANT
STATION PROTECTION PRESSURIZED (MISCELLANEOUS) IMPLANT

## 2023-11-21 NOTE — Progress Notes (Addendum)
 [facility] CLINIC CARDIOLOGY PROGRESS NOTE   Patient ID: Mathew Baker MRN: [id number] DOB/AGE: [birth year] 83 y.o.  Admit date: 11/18/2023 Referring Physician Dr Larinda Buttery Primary Physician Dareen Piano, Marya Amsler, MD Primary Cardiologist [facility] cardiology Reason for Consultation NSTEMI   HPI: Mathew Baker is a 83 y.o. male  with a past medical history of  of COPD, T2DM, CVA, hyperlipidemia, hypertension, cholecystitis s/p cholecystectomy and hepatocellular carcinoma treated with Y90 who presented to the ED with chief complaints of progressive shortness of breath x 3 days. Patient states he has also been experiencing some chest tightness. Of note, his breathing has been much worse recently. Patient has not ever had a heart catheterization before but he is agreeable to proceed with this on Monday given elevated troponins and worsening SOB/chest pain. Family at bedside, discussed plan and they are agreeable.  Interval History:  -Patient seen and examined at bedside, resting comfortably.  No events overnight.  Requested to be placed back on BiPAP following LHC as he was feeling some shortness of breath. -Discussed results of LHC with daughter and patient at bedside.  Recommended transfer to tertiary care center for further evaluation of complex CAD as well as aortic stenosis.  They request transfer to [facility].  Review of systems complete and found to be negative unless listed above    Vitals:   11/21/23 1200 11/21/23 1215 11/21/23 1230 11/21/23 1300  BP:  118/64 (!) 108/59 111/61  Pulse: 80 91 85 84  Resp: 15 16 17  (!) 9  Temp:   98.3 F (36.8 C)   TempSrc:   Oral   SpO2: 96% 96% 96% 95%  Weight:      Height:         Intake/Output Summary (Last 24 hours) at 11/21/2023 1336 Last data filed at 11/21/2023 1200 Gross per 24 hour  Intake 905.13 ml  Output 2125 ml  Net -1219.87 ml     PHYSICAL EXAM General: awake, well nourished, in no acute distress. HEENT: Normocephalic and  atraumatic. Neck: No JVD.  Lungs: on bipap, decreased breath sounds bilaterally, coarse throughout Heart: HRRR. Normal S1 and S2 without gallops or murmurs.  Abdomen: Non-distended appearing.  Msk: Normal strength and tone for age. Extremities: Warm and well perfused. No clubbing, cyanosis. no edema.  Neuro: Alert and oriented X 3. Psych: Answers questions appropriately   LABS: Basic Metabolic Panel: Recent Labs    11/20/23 0346 11/21/23 0207 11/21/23 0416  NA 137 121* 139  K 4.9 4.8 4.7  CL 107 89* 104  CO2 24 23 27   GLUCOSE 160* 147* 168*  BUN 40* 47* 51*  CREATININE 1.15 0.91 1.08  CALCIUM 8.3* 7.0* 8.1*  MG 2.3 2.1  --   PHOS 3.1 2.2*  --    Liver Function Tests: Recent Labs    11/18/23 2143 11/19/23 0836  AST 26 122*  ALT 16 27  ALKPHOS 75 59  BILITOT 0.9 0.6  PROT 6.2* 6.2*  ALBUMIN 3.2* 3.0*   No results for input(s): "LIPASE", "AMYLASE" in the last 72 hours. CBC: Recent Labs    11/18/23 2143 11/19/23 0347 11/20/23 0346 11/21/23 0207  WBC 11.0*   < > 21.6* 15.3*  NEUTROABS 9.4*  --   --   --   HGB 9.9*   < > 9.7* 9.8*  HCT 32.3*   < > 30.3* 31.3*  MCV 96.4   < > 94.1 97.8  PLT 221   < > 209 218   < > = values  in this interval not displayed.   Cardiac Enzymes: Recent Labs    11/18/23 2143 11/18/23 2340 11/19/23 0836  TROPONINIHS 110* 546* >24,000*   BNP: Recent Labs    11/18/23 2143 11/19/23 1114  BNP 322.9* 917.8*   D-Dimer: Recent Labs    11/18/23 2340  DDIMER 0.56*   Hemoglobin A1C: Recent Labs    11/18/23 2340  HGBA1C 9.1*   Fasting Lipid Panel: No results for input(s): "CHOL", "HDL", "LDLCALC", "TRIG", "CHOLHDL", "LDLDIRECT" in the last 72 hours. Thyroid Function Tests: No results for input(s): "TSH", "T4TOTAL", "T3FREE", "THYROIDAB" in the last 72 hours.  Invalid input(s): "FREET3" Anemia Panel: No results for input(s): "VITAMINB12", "FOLATE", "FERRITIN", "TIBC", "IRON", "RETICCTPCT" in the last 72  hours.  CARDIAC CATHETERIZATION Result Date: 11/21/2023   Prox RCA lesion is 75% stenosed.   Mid RCA-1 lesion is 75% stenosed.   Mid RCA-2 lesion is 75% stenosed.   Mid RCA to Dist RCA lesion is 90% stenosed.   Dist RCA lesion is 50% stenosed with 50% stenosed side branch in RPAV.   Ost RCA to Prox RCA lesion is 50% stenosed.   Ost LM to Mid LM lesion is 70% stenosed.   Dist LM lesion is 90% stenosed.   Ost Cx lesion is 100% stenosed.   Ost LAD to Prox LAD lesion is 95% stenosed.   Prox LAD to Dist LAD lesion is 50% stenosed.   2nd Diag lesion is 75% stenosed.   There is moderate to severe left ventricular systolic dysfunction.   LV end diastolic pressure is moderately elevated.   The left ventricular ejection fraction is 25-35% by visual estimate.   Anticipated discharge date to be determined.   Recommend to resume Apixaban, at currently prescribed dose and frequency. Conclusion Left heart cath right radial approach Non-STEMI shortness of breath Left ventriculogram Left ventricular enlargement global hypokinesis EF around 30% Coronaries Left main large moderate to severe calcification 70% distal LAD large 90% ostial 95% proximal diffuse 50% moderate to severe calcification proximally Circumflex 100% occluded ostially moderate to severe calcification RCA large serial 75% lesions mid heavy calcification 90% mid to distal moderate calcification right dominant system moderate disease diffusely distally Intervention deferred Recommend evaluation for high risk coronary bypass surgery versus high risk PCI and stent for complex disease Medical therapy is also not unreasonable Will discuss images with tertiary care facility   ECHOCARDIOGRAM COMPLETE Result Date: 11/21/2023    ECHOCARDIOGRAM REPORT   Patient Name:   Mathew Baker Date of Exam: 11/19/2023 Medical Rec #:  [id number]           Height: Accession #:    [id number]          Weight: Date of Birth:  1941/05/02           BSA: Patient Age:    82 years             BP:           99/64 mmHg Patient Gender: M                   HR:           85 bpm. Exam Location:  [facility] Procedure: 2D Echo, Cardiac Doppler and Color Doppler (Both Spectral and Color            Flow Doppler were utilized during procedure). Indications:     Acutre respiratory distress R06.03  History:         Patient has  prior history of Echocardiogram examinations.                  Stroke; Risk Factors:Hypertension.  Sonographer:     Neysa Bonito Roar Referring Phys:  Chilton Si MD Diagnosing Phys: Chilton Si MD IMPRESSIONS  1. Left ventricular ejection fraction, by estimation, is 25 to 30%. The left ventricle has severely decreased function. The left ventricle demonstrates global hypokinesis. There is mild concentric left ventricular hypertrophy. Left ventricular diastolic  parameters are consistent with Grade I diastolic dysfunction (impaired relaxation). Elevated left ventricular end-diastolic pressure.  2. Right ventricular systolic function is normal. The right ventricular size is normal. There is normal pulmonary artery systolic pressure.  3. Left atrial size was moderately dilated.  4. The mitral valve is normal in structure. Mild mitral valve regurgitation. No evidence of mitral stenosis.  5. Dimensionless index in 0.23, consistent with severe aortic stenosis. The severity of the aortic stenosis is likely underestimated by mean gradient due to low flow caused by systolic dysfunction.. The aortic valve is calcified. There is severe calcifcation of the aortic valve. There is severe thickening of the aortic valve. Aortic valve regurgitation is mild to moderate. Severe aortic valve stenosis. Aortic valve mean gradient measures 21.0 mmHg. Aortic valve Vmax measures 2.95 m/s.  6. The inferior vena cava is dilated in size with <50% respiratory variability, suggesting right atrial pressure of 15 mmHg. FINDINGS  Left Ventricle: Left ventricular ejection fraction, by estimation, is 25 to 30%. The left  ventricle has severely decreased function. The left ventricle demonstrates global hypokinesis. The left ventricular internal cavity size was normal in size. There is mild concentric left ventricular hypertrophy. Left ventricular diastolic parameters are consistent with Grade I diastolic dysfunction (impaired relaxation). Elevated left ventricular end-diastolic pressure. Right Ventricle: The right ventricular size is normal. No increase in right ventricular wall thickness. Right ventricular systolic function is normal. There is normal pulmonary artery systolic pressure. The tricuspid regurgitant velocity is 2.09 m/s, and  with an assumed right atrial pressure of 15 mmHg, the estimated right ventricular systolic pressure is 32.5 mmHg. Left Atrium: Left atrial size was moderately dilated. Right Atrium: Right atrial size was normal in size. Pericardium: There is no evidence of pericardial effusion. Mitral Valve: The mitral valve is normal in structure. Mild mitral valve regurgitation. No evidence of mitral valve stenosis. Tricuspid Valve: The tricuspid valve is normal in structure. Tricuspid valve regurgitation is trivial. No evidence of tricuspid stenosis. Aortic Valve: Dimensionless index in 0.23, consistent with severe aortic stenosis. The severity of the aortic stenosis is likely underestimated by mean gradient due to low flow caused by systolic dysfunction. The aortic valve is calcified. There is severe calcifcation of the aortic valve. There is severe thickening of the aortic valve. Aortic valve regurgitation is mild to moderate. Severe aortic stenosis is present. Aortic valve mean gradient measures 21.0 mmHg. Aortic valve peak gradient measures  34.8 mmHg. Pulmonic Valve: The pulmonic valve was normal in structure. Pulmonic valve regurgitation is not visualized. No evidence of pulmonic stenosis. Aorta: The aortic root is normal in size and structure. Venous: The inferior vena cava is dilated in size with less  than 50% respiratory variability, suggesting right atrial pressure of 15 mmHg. IAS/Shunts: No atrial level shunt detected by color flow Doppler.  LEFT VENTRICLE PLAX 2D LVIDd:         5.90 cm Diastology LVIDs:         5.00 cm LV e' medial:    3.90 cm/s LV  PW:         1.10 cm LV E/e' medial:  34.1 LV IVS:        1.30 cm LV e' lateral:   4.65 cm/s                        LV E/e' lateral: 28.6  RIGHT VENTRICLE RV Basal diam:  3.60 cm RV Mid diam:    3.40 cm RV Length:      6.60 cm RV S prime:     1340.00 cm/s TAPSE (M-mode): 1.7 cm LEFT ATRIUM LA diam:      4.00 cm LA Vol (A2C): 27.9 ml LA Vol (A4C): 80.3 ml  AORTIC VALVE                    PULMONIC VALVE AV Vmax:           295.00 cm/s  PV Vmax:       83.40 m/s AV Vmean:          173.000 cm/s PV Peak grad:  27822.2 mmHg AV VTI:            0.576 m AV Peak Grad:      34.8 mmHg AV Mean Grad:      21.0 mmHg LVOT Vmax:         67.90 cm/s LVOT Vmean:        43.200 cm/s LVOT VTI:          0.134 m LVOT/AV VTI ratio: 0.23  AORTA Ao STJ diam: 2.9 cm Ao Asc diam: 3.90 cm MV E velocity: 133.00 cm/s  TRICUSPID VALVE                             TV Peak grad:   17.5 mmHg                             TV Vmax:        2.09 m/s                             TR Peak grad:   17.5 mmHg                             TR Vmax:        209.00 cm/s                              SHUNTS                             Systemic VTI: 0.13 m Chilton Si MD Electronically signed by Chilton Si MD Signature Date/Time: 11/21/2023/5:53:04 AM    Final    DG Chest Port 1 View Result Date: 11/20/2023 CLINICAL DATA:  Acute respiratory failure. EXAM: PORTABLE CHEST 1 VIEW COMPARISON:  11/19/2023 FINDINGS: Mild cardiac enlargement, stable. Bilateral posterior layering pleural effusions with veil like opacification over the lower lung zones unchanged. Overlying areas of atelectasis. Mild increase interstitial markings compatible with pulmonary edema. IMPRESSION: 1. No change in appearance of the chest compared  with the prior study. 2. Cardiac enlargement with persistent pleural effusions, bibasilar atelectasis and mild interstitial edema. Electronically Signed  By: Signa Kell M.D.   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  Troponins 110 --> 564 --> 24,000.  Echo revealed reduced EF at 25-30% (this is new), global hypokinesis, global hypokinesis, grade 1 diastolic dysfunction, likely severe aortic stenosis with AV mean gradient 21 mmHg, V-max 2.95.  Suspect low flow low gradient aortic stenosis -Continue on heparin gtt -Continue atorvastatin 80 mg daily, aspirin 81 mg daily. -Continue carvedilol 3.125 mg twice daily, Jardiance 25 mg daily.  Can consider further additions to GDMT as BP and renal function allow. -Continue IV Lasix 40 mg twice daily. -Optimize lytes, K>4 and Mag>2 -Multivessel disease and echo results exhibiting likely severe aortic stenosis discussed in detail with patient and daughter at bedside.  Per patient he would like to pursue possible invasive evaluation and intervention.  They have requested transfer to [facility] for further evaluation.  Case discussed by Dr. Juliann Pares with [facility] team, they have accepted him for transfer and the accepting physician is Dr. Deberah Castle.   # Paroxysmal atrial fibrillation -Continue on heparin gtt -Labetalol as needed for RVR.  Will consider adding p.o. beta-blocker pending BP. -Currently patient is rate controlled   # Hypertension -Additions to GDMT as BP and renal function allow as above   # Hyperlipidemia -On lipitor 80 mg    This  patient's plan of care was discussed and created with Dr. Corky Sing and he is in agreement.    Signed: Gale Journey, PA-C 11/21/2023, 1:36 PM [facility] Cardiology

## 2023-11-21 NOTE — Assessment & Plan Note (Signed)
 Patient on IV Lasix 40 mg IV twice daily, Jardiance, Coreg.

## 2023-11-21 NOTE — Assessment & Plan Note (Signed)
 Cardiac cath done on 11/21/2023.  Cardiology recommends transfer to tertiary care center for evaluation of high risk PCI versus CABG.  Left main 70% distal, LAD 90% ostial, 95% proximal and 50% moderate to severe calcification, circumflex 100% occluded.  EF 25 to 30% on echocardiogram.  Cardiology recommended going back on heparin drip.  Patient on aspirin, Lipitor, Coreg.

## 2023-11-21 NOTE — Assessment & Plan Note (Addendum)
 Patient initially required BiPAP with increased work of breathing and ICU admission.  Patient now on 4 L nasal cannula.  Sepsis and pneumonia ruled out because this is NSTEMI and CHF.

## 2023-11-21 NOTE — Progress Notes (Signed)
 Heart Failure Navigator Progress Note  Assessed for Heart & Vascular TOC clinic readiness.  Patient does not meet criteria due to current [facility] patient.   Navigator will sign off at this time.  Roxy Horseman, RN, BSN [facility] Heart Failure Navigator Secure Chat Only

## 2023-11-21 NOTE — Progress Notes (Signed)
 NAME:  Mathew Baker, MRN:  [id number], DOB:  1941/06/18, LOS: 3 ADMISSION DATE:  11/18/2023, CONSULTATION DATE:  11/18/23 REFERRING MD:  Donna Bernard, CHIEF COMPLAINT:  shortness of breath    HPI  83 y.o male with significant PMH of COPD, T2DM, CVA, hyperlipidemia, hypertension, cholecystitis s/p cholecystectomy and hepatocellular carcinoma treated with Y90 who presented to the ED with chief complaints of progressive shortness of breath x 3 days.  Patient reports symptoms worsened today with associated productive cough and generalized weakness so he called EMS.  On EMS arrival patient was hypoxic with sats in the low 80s on room air.  He was treated with DuoNebs,, continuous albuterol nebulizer, magnesium and 125mg  of Solu-Medrol IV.   ED Course: Initial vital signs showed Blood pressure (!) 89/52, pulse 97, temperature 98.3 F (36.8 C), temperature source Oral, resp. rate (!) 21,  and the oxygen saturation 90% on 6L [place]. He was working hard to breath so was placed on BiPAP support.  Pertinent Labs/Diagnostics Findings: Glucose: 345. CO2 17 WBC:11.0 K/L Hgb/Hct: 9.9/32.3 PCT: negative <0.10  Lactic acid:5.9 COVID PCR: Negative,  troponin: 110 BNP: 322.9  GBT:DVVOHYWVP edema. Question left base atelectasis  Patient given 30 cc/kg of fluids and started on broad-spectrum antibiotics Ceftriaxone and Azithromycin for suspected sepsis due to pneumonia. Due to high risk for decompensation, PCCM consulted for ICU admission.  Past Medical History  COPD, T2DM, CVA, hyperlipidemia, hypertension, cholecystitis s/p cholecystectomy and  hepatocellular carcinoma treated with Y90  Significant Hospital Events   3/14: Admit with acute hypoxic respiratory failure in setting of AECOPD, questionable pneumonia and bilateral pleural effusion 3/15: Pt on and off Bipap due to shortness of breath.  Repeat troponin >24,000 Cardiology consulted tentative plan for cardiac cath 03/17 if stable from  respiratory standpoint  3/16: Pt c/o nausea followed by vomiting no chest pain.  Remains off levophed gtt map 65 or higher.  Will transfer to stepdown unit and transfer service to [facility]  11/21/23-  patient with no acute events overnight, on HFNC 30%. Leukocytosis improving, bmp stable with mild incrementation of BUN. TRH has taken over and PCCM will sign off  Significant Diagnostic Tests:  3/14: Chest Xray> IMPRESSION: 1. Pulmonary edema. 2. Question left base atelectasis. Recommend further evaluation with PA and lateral view of the chest. 3.  Aortic Atherosclerosis (ICD10-I70.0).  3/14: CT Chest, abdomen and pelvis> MPRESSION: Small to moderate bilateral pleural effusions. Bilateral lower lobe airspace opacities could reflect atelectasis or pneumonia. Diffuse coronary artery disease.  Aortic atherosclerosis. Borderline heart size. Scattered ground-glass opacities in the lungs could reflect atelectasis or early edema. No acute findings in the abdomen or pelvis.    Interim History / Subjective:    As outlined above under significant events   Micro Data:  3/14: SARS-CoV-2 PCR> negative 3/14: Influenza PCR> negative 3/14: Blood culture x2>>NGTD 3/14: MRSA PCR>> negative  3/14: RVP>>negative  3/15: Strep pneumo urinary antigen>>negative  3/15: Legionella urinary antigen>>   Anti-infectives (From admission, onward)    Start     Dose/Rate Route Frequency Ordered Stop   11/19/23 2330  [MAR Hold]  azithromycin (ZITHROMAX) 500 mg in sodium chloride 0.9 % 250 mL IVPB        (MAR Hold since Mon 11/21/2023 at 0731.Hold Reason: Transfer to a Procedural area)   500 mg 250 mL/hr over 60 Minutes Intravenous Every 24 hours 11/19/23 0100     11/19/23 2230  cefTRIAXone (ROCEPHIN) 1 g in sodium chloride 0.9 % 100 mL IVPB  Status:  Discontinued        1 g 200 mL/hr over 30 Minutes Intravenous Every 24 hours 11/19/23 0100 11/20/23 1117   11/18/23 2230  cefTRIAXone (ROCEPHIN) 2 g in sodium chloride  0.9 % 100 mL IVPB        2 g 200 mL/hr over 30 Minutes Intravenous Once 11/18/23 2221 11/18/23 2320   11/18/23 2230  azithromycin (ZITHROMAX) 500 mg in sodium chloride 0.9 % 250 mL IVPB        500 mg 250 mL/hr over 60 Minutes Intravenous  Once 11/18/23 2221 11/19/23 0056        OBJECTIVE  Blood pressure 110/66, pulse 85, temperature 97.7 F (36.5 C), temperature source Oral, resp. rate 14, height 6\' 1"  (1.854 m), weight 102.1 kg, SpO2 96%.  FiO2 (%):  [30 %] 30 %   Intake/Output Summary (Last 24 hours) at 11/21/2023 0839 Last data filed at 11/21/2023 1610 Gross per 24 hour  Intake 905.13 ml  Output 2100 ml  Net -1194.87 ml   Filed Weights   11/20/23 2244 11/21/23 0249 11/21/23 0731  Weight: 109.4 kg 109.4 kg 102.1 kg   Physical Examination  GENERAL: Acutely-ill appearing male, NAD on nasal canula  HEENT: Head atraumatic, normocephalic. Oropharynx and nasopharynx clear.  No JVD present  LUNGS: Inspiratory wheezes throughout, even, non labored  CARDIOVASCULAR: NSR, no r/g, 2+ radial/1+ distal pulses, trace bilateral lower extremity edema  ABDOMEN: +BS x4, obese soft, non distended, non tender  EXTREMITIES: Normal bulk and tone, moves all extremities  NEUROLOGIC: Alert and oriented, follows commands, PERRLA  SKIN: No obvious rash, lesion, or ulcer. Warm to touch   Labs   CBC: Recent Labs  Lab 11/18/23 2143 11/19/23 0347 11/20/23 0346 11/21/23 0207  WBC 11.0* 9.4 21.6* 15.3*  NEUTROABS 9.4*  --   --   --   HGB 9.9* 9.7* 9.7* 9.8*  HCT 32.3* 31.6* 30.3* 31.3*  MCV 96.4 95.5 94.1 97.8  PLT 221 218 209 218    Basic Metabolic Panel: Recent Labs  Lab 11/18/23 2143 11/19/23 0347 11/20/23 0346 11/21/23 0207 11/21/23 0416  NA 136 138 137 121* 139  K 3.9 3.9 4.9 4.8 4.7  CL 108 105 107 89* 104  CO2 17* 19* 24 23 27   GLUCOSE 345* 392* 160* 147* 168*  BUN 17 22 40* 47* 51*  CREATININE 0.95 1.32* 1.15 0.91 1.08  CALCIUM 8.3* 8.3* 8.3* 7.0* 8.1*  MG  --  2.2 2.3 2.1   --   PHOS  --  3.9 3.1 2.2*  --    GFR: Estimated Creatinine Clearance: 65.1 mL/min (by C-G formula based on SCr of 1.08 mg/dL). Recent Labs  Lab 11/18/23 2143 11/18/23 2226 11/18/23 2340 11/19/23 0347 11/19/23 0836 11/19/23 1114 11/20/23 0346 11/20/23 0842 11/21/23 0207  PROCALCITON <0.10  --   --   --   --   --  0.11  --   --   WBC 11.0*  --   --  9.4  --   --  21.6*  --  15.3*  LATICACIDVEN  --    < > 6.7*  --  6.5* 4.4*  --  1.8  --    < > = values in this interval not displayed.    Liver Function Tests: Recent Labs  Lab 11/18/23 2143 11/19/23 0836  AST 26 122*  ALT 16 27  ALKPHOS 75 59  BILITOT 0.9 0.6  PROT 6.2* 6.2*  ALBUMIN 3.2* 3.0*   No  results for input(s): "LIPASE", "AMYLASE" in the last 168 hours. No results for input(s): "AMMONIA" in the last 168 hours.  ABG    Component Value Date/Time   HCO3 23.8 11/19/2023 1233   ACIDBASEDEF 11.2 (H) 11/19/2023 0059   O2SAT 97.9 11/19/2023 1233     Coagulation Profile: Recent Labs  Lab 11/18/23 2226  INR 1.2    Cardiac Enzymes: No results for input(s): "CKTOTAL", "CKMB", "CKMBINDEX", "TROPONINI" in the last 168 hours.  HbA1C: Hgb A1c MFr Bld  Date/Time Value Ref Range Status  11/18/2023 11:40 PM 9.1 (H) 4.8 - 5.6 % Final    Comment:    (NOTE) Pre diabetes:          5.7%-6.4%  Diabetes:              >6.4%  Glycemic control for   <7.0% adults with diabetes   05/19/2023 11:06 PM 8.7 (H) 4.8 - 5.6 % Final    Comment:    (NOTE)         Prediabetes: 5.7 - 6.4         Diabetes: >6.4         Glycemic control for adults with diabetes: <7.0     CBG: Recent Labs  Lab 11/20/23 0723 11/20/23 1132 11/20/23 1537 11/20/23 2109 11/21/23 0729  GLUCAP 115* 183* 166* 149* 166*    Review of Systems: Positives in BOLD  Gen: Denies fever, chills, weight change, fatigue, night sweats HEENT: Denies blurred vision, double vision, hearing loss, tinnitus, sinus congestion, rhinorrhea, sore throat, neck  stiffness, dysphagia PULM: shortness of breath, cough, sputum production, hemoptysis, wheezing CV: Denies chest pain, edema, orthopnea, paroxysmal nocturnal dyspnea, palpitations GI: Denies abdominal pain, nausea, vomiting, diarrhea, hematochezia, melena, constipation, change in bowel habits GU: Denies dysuria, hematuria, polyuria, oliguria, urethral discharge Endocrine: Denies hot or cold intolerance, polyuria, polyphagia or appetite change Derm: Denies rash, dry skin, scaling or peeling skin change Heme: Denies easy bruising, bleeding, bleeding gums Neuro: Denies headache, numbness, weakness, slurred speech, loss of memory or consciousness  Past Medical History  He,  has a past medical history of Chronic airway obstruction (HCC), Degeneration of intervertebral disc of lumbar region, Diabetes mellitus without complication (HCC), Essential hypertension, benign, Hyperlipidemia, Hypertension, Osteoarthritis of lower extremity, Pneumonia, organism unspecified(486), PSA elevation (07/16/2013), and Stroke (HCC).   Surgical History    Past Surgical History:  Procedure Laterality Date   CATARACT EXTRACTION W/ INTRAOCULAR LENS  IMPLANT, BILATERAL     COLONOSCOPY  2006   Normal: Repeat in 10 yrs   IR PERC CHOLECYSTOSTOMY  05/20/2023   IR RADIOLOGIST EVAL & MGMT  06/22/2023   LOOP RECORDER INSERTION N/A 02/21/2020   Procedure: LOOP RECORDER INSERTION;  Surgeon: Marcina Millard, MD;  Location: [facility] INVASIVE CV LAB;  Service: Cardiovascular;  Laterality: N/A;   LOOP RECORDER REMOVAL N/A 10/23/2020   Procedure: LOOP RECORDER REMOVAL;  Surgeon: Marcina Millard, MD;  Location: [facility] INVASIVE CV LAB;  Service: Cardiovascular;  Laterality: N/A;   TEE WITHOUT CARDIOVERSION N/A 09/15/2020   Procedure: TRANSESOPHAGEAL ECHOCARDIOGRAM (TEE);  Surgeon: Dalia Heading, MD;  Location: [facility] ORS;  Service: Cardiovascular;  Laterality: N/A;     Social History   reports that he quit smoking about 65 years  ago. His smoking use included cigarettes. He has never used smokeless tobacco. He reports that he does not drink alcohol and does not use drugs.   Family History   His family history includes Diabetes in his mother.   Allergies No  Known Allergies   Home Medications  Prior to Admission medications   Medication Sig Start Date End Date Taking? Authorizing Provider  albuterol (VENTOLIN HFA) 108 (90 Base) MCG/ACT inhaler Inhale 2 puffs into the lungs every 6 (six) hours as needed for wheezing or shortness of breath. 04/28/20   [provider]  Alcohol Swabs (ALCOHOL WIPES) 70 % PADS USE FOR INSULIN INJECTIONS OR TO FINGER STICK AS DIRECTED BY YOUR MEDICAL PROVIDER 04/28/20   [provider]  ascorbic acid (VITAMIN C) 500 MG tablet Take 1 tablet (500 mg total) by mouth daily. 09/17/20   Drema Dallas, MD  atorvastatin (LIPITOR) 40 MG tablet Take 40 mg by mouth at bedtime. 01/21/22   [provider]  atorvastatin (LIPITOR) 80 MG tablet Take 80 mg by mouth daily.    [provider]  docusate sodium (COLACE) 100 MG capsule Take 100 mg by mouth 2 (two) times daily. 05/02/23   [provider]  ELIQUIS 5 MG TABS tablet Take 5 mg by mouth 2 (two) times daily. 08/18/20   [provider]  empagliflozin (JARDIANCE) 25 MG TABS tablet 1 tablet 1 day or 1 dose. 12/09/20   [provider]  feeding supplement (ENSURE ENLIVE / ENSURE PLUS) LIQD Take 237 mLs by mouth 3 (three) times daily between meals. 05/24/23   Alford Highland, MD  fluconazole (DIFLUCAN) 100 MG tablet Take 1 tablet (100 mg total) by mouth daily. 05/26/23   Alford Highland, MD  furosemide (LASIX) 20 MG tablet Take 20 mg by mouth daily. 10/13/20   [provider]  glucose blood (PRECISION QID TEST) test strip Use to test blood sugar 2 times daily as instructed. Dx: E11.65 08/22/15   [provider]  HYDROcodone-acetaminophen (NORCO/VICODIN) 5-325 MG tablet Take 1 tablet by  mouth every 8 (eight) hours as needed. 02/26/21   [provider]  insulin aspart (NOVOLOG) 100 UNIT/ML injection Inject 0-6 Units into the skin 3 (three) times daily as needed for high blood sugar. 05/24/23   Alford Highland, MD  insulin glargine (LANTUS) 100 UNIT/ML injection Inject 0.3 mLs (30 Units total) into the skin 2 (two) times daily. 05/24/23   Alford Highland, MD  Insulin Pen Needle (PEN NEEDLES 29GX1/2") 29G X MISC Use 1x a day 08/21/14   [provider]  ipratropium (ATROVENT HFA) 17 MCG/ACT inhaler Inhale 2 puffs into the lungs every 6 (six) hours. 09/16/20   Drema Dallas, MD  latanoprost (XALATAN) 0.005 % ophthalmic solution Place 1 drop into both eyes at bedtime.    [provider]  lisinopril (ZESTRIL) 5 MG tablet Take 5 mg by mouth daily. 03/03/23   [provider]  Magnesium Oxide 420 MG TABS Take 1 tablet by mouth daily. 10/31/20   [provider]  pantoprazole (PROTONIX) 40 MG tablet Take 1 tablet (40 mg total) by mouth daily. 05/25/23   Alford Highland, MD  sildenafil (VIAGRA) 100 MG tablet Take 1 tablet by mouth as needed. 12/08/20   [provider]  tamsulosin (FLOMAX) 0.4 MG CAPS capsule Take 0.4 mg by mouth at bedtime.    [provider]  vitamin B-12 (CYANOCOBALAMIN) 100 MCG tablet Take 100 mcg by mouth daily.    [provider]  Scheduled Meds:  [MAR Hold] aspirin EC  81 mg Oral Daily   [MAR Hold] atorvastatin  80 mg Oral Daily   [MAR Hold] Chlorhexidine Gluconate Cloth  6 each Topical Daily   [MAR Hold] insulin aspart  0-15 Units Subcutaneous TID WC   [MAR Hold] insulin aspart  0-5 Units Subcutaneous QHS   [MAR Hold] insulin glargine  10 Units Subcutaneous Daily   [MAR Hold] ipratropium-albuterol  3 mL Nebulization Q4H   [MAR Hold] pantoprazole (PROTONIX) IV  40 mg Intravenous Q24H   Continuous Infusions:  sodium chloride Stopped (11/21/23 0724)   [MAR Hold] azithromycin Stopped (11/21/23  0118)   heparin Stopped (11/21/23 0644)   PRN Meds:.[MAR Hold] docusate sodium, Heparin (Porcine) in NaCl, [MAR Hold]  morphine injection, [MAR Hold] ondansetron (ZOFRAN) IV, [MAR Hold] mouth rinse, [MAR Hold] polyethylene glycol   Active Hospital Problem list   See systems below  Assessment & Plan:  #Acute hypoxic respiratory failure #Acute exacerbation of COPD #Pneumonia #Pulmonary edema  #Bilateral pleural effusion Background 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 Bilateral lower lobe airspace opacities could reflect atelectasis or pneumonia. Diffuse coronary artery disease.  Aortic atherosclerosis. Borderline heart size. Scattered ground-glass opacities in the lungs could reflect atelectasis or early edema - Supplemental O2 or Bipap prn for dyspnea and/or hypoxia   - Maintain O2 sats 88% to 92% - Follow intermittent CXR & ABG as needed - Scheduled and prn bronchodilator therapy  - IV lasix x1 dose  - Trend WBC and monitor fever curve  - Trend PCT  - Follow cultures  - Continue abx as outlined above pending culture results and sensitivities   #NSTEMI  #Hypotension: suspect secondary to sepsis  #PAF Hx: HLD, essential HTN and stroke - Continuous telemetry monitoring  - Troponin >24,000 - Cardiology consulted appreciate input: tentatively scheduled for cardiac cath 03/17 if stable from a respiratory standpoint  - Echo results pending - Aspirin and atorvastatin  - Continue heparin gtt: dosing per pharmacy  - Hold outpatient antihypertensives  - No longer requiring levophed gtt  - 40 mg IV lasix x1 dose   #Acute kidney injury suspect secondary to ATN~resolved  #Lactic acidosis~resolved  #Non anion gap metabolic acidosis~resolved  - Trend BMP, vbg, and lactic acid  - Replace electrolytes as indicated  - Strict I&O's  - Avoid nephrotoxic agents as  able    #Hepatocellular Carcinoma (HCC)  - Treated with Y90 in October 2023.  - Follows with [facility]  #Type II diabetes mellitus  - CBG's ac/hs - SSI and scheduled lantus  - Follow hyper/hypoglycemic protocol  - Target CBG range 140 to 180   Best practice:  Diet: Regular Consistency  Pain/Anxiety/Delirium protocol (if indicated): Prn morphine for pain  VAP protocol (if indicated): Not indicated DVT prophylaxis: Systemic AC GI prophylaxis: PPI Glucose control:  SSI Yes Central venous access:  N/A Arterial line:  N/A Foley:  N/A Mobility:  bed rest  PT consulted: N/A Last date of multidisciplinary goals of care discussion [11/20/2023] Code Status:  full code Disposition: Illene Silver, M.D.  Pulmonary & Critical Care Medicine

## 2023-11-21 NOTE — Progress Notes (Signed)
 Patient ID: Mathew Baker, male   DOB: 1941-08-06, 83 y.o.   MRN: [id number]  Brief follow-up note  Case discussed with [facility] They have accepted him in transfer  Accepting attending Dr. Deberah Castle  Patient will need a stepdown progressive bed Should stay on heparin Images also reviewed with Dr. Renato Gails Patient will be reviewed in heart team meeting in the morning and decisions about management with be made at that point  Family aware of transfer to [facility] We will try to arrange EMTALA Hopefully can arrange discharge summary Hopefully patient will be transferred today  [facility] 11/21/2023

## 2023-11-21 NOTE — Hospital Course (Signed)
 83 y.o male with significant PMH of COPD, T2DM, CVA, hyperlipidemia, hypertension, cholecystitis s/p cholecystectomy and hepatocellular carcinoma treated with Y90 who presented to the ED with chief complaints of progressive shortness of breath x 3 days.   Patient reports symptoms worsened today with associated productive cough and generalized weakness so he called EMS.  On EMS arrival patient was hypoxic with sats in the low 80s on room air.  He was treated with DuoNebs,, continuous albuterol nebulizer, magnesium and 125mg  of Solu-Medrol IV.   ED Course: Initial vital signs showed Blood pressure (!) 89/52, pulse 97, temperature 98.3 F (36.8 C), temperature source Oral, resp. rate (!) 21,  and the oxygen saturation 90% on 6L [place]. He was working hard to breath so was placed on BiPAP support.   Pertinent Labs/Diagnostics Findings: Glucose: 345. CO2 17 WBC:11.0 K/L Hgb/Hct: 9.9/32.3 PCT: negative <0.10  Lactic acid:5.9 COVID PCR: Negative,  troponin: 110 BNP: 322.9  ZOX:[id number] edema. Question left base atelectasis   Patient given 30 cc/kg of fluids and started on broad-spectrum antibiotics Ceftriaxone and Azithromycin for suspected sepsis due to pneumonia. Due to high risk for decompensation, PCCM consulted for ICU admission.  3/14: Admit with acute hypoxic respiratory failure in setting of AECOPD, questionable pneumonia and bilateral pleural effusion 3/15: Pt on and off Bipap due to shortness of breath.  Repeat troponin >24,000 Cardiology consulted tentative plan for cardiac cath 03/17 if stable from respiratory standpoint  3/16: Pt c/o nausea followed by vomiting no chest pain.  Remains off levophed gtt map 65 or higher.  Will transfer to stepdown unit and transfer service to [facility]. 3/17.  Medical team took over today.  Cardiac catheterization was done for non-STEMI.  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  Cardiology recommended transfer to tertiary care center for high risk PCI versus CABG. 3/18.  Patient will go to [facility] today.  LDL 26, hemoglobin 9.8, creatinine 1.13.

## 2023-11-21 NOTE — Plan of Care (Signed)
  Problem: Coping: Goal: Ability to adjust to condition or change in health will improve Outcome: Progressing   Problem: Nutritional: Goal: Maintenance of adequate nutrition will improve Outcome: Progressing   Problem: Education: Goal: Knowledge of General Education information will improve Description: Including pain rating scale, medication(s)/side effects and non-pharmacologic comfort measures Outcome: Progressing   Problem: Clinical Measurements: Goal: Ability to maintain clinical measurements within normal limits will improve Outcome: Progressing

## 2023-11-21 NOTE — Assessment & Plan Note (Signed)
 Continue high-dose Lipitor.  LDL 26

## 2023-11-21 NOTE — Assessment & Plan Note (Signed)
 Continue Coreg

## 2023-11-21 NOTE — Progress Notes (Signed)
 PHARMACY - ANTICOAGULATION CONSULT NOTE  Pharmacy Consult for Heparin  Indication: atrial fibrillation  No Known Allergies  Patient Measurements: Height: 6\' 1"  (185.4 cm) Weight: 102.1 kg (225 lb) IBW/kg (Calculated) : 79.9 Heparin Dosing Weight: 100.5 kg   Vital Signs: Temp: 98.3 F (36.8 C) (03/17 1230) Temp Source: Oral (03/17 1230) BP: 107/59 (03/17 1415) Pulse Rate: 79 (03/17 1415)  Labs: Recent Labs    11/18/23 2143 11/18/23 2143 11/18/23 2226 11/18/23 2340 11/19/23 0347 11/19/23 0836 11/19/23 1603 11/20/23 0346 11/20/23 0842 11/20/23 1815 11/21/23 0207 11/21/23 0416  HGB 9.9*  --   --   --  9.7*  --   --  9.7*  --   --  9.8*  --   HCT 32.3*  --   --   --  31.6*  --   --  30.3*  --   --  31.3*  --   PLT 221  --   --   --  218  --   --  209  --   --  218  --   APTT  --    < > 28  --   --  102*   < >  --  63* 73* 87*  --   LABPROT  --   --  15.7*  --   --   --   --   --   --   --   --   --   INR  --   --  1.2  --   --   --   --   --   --   --   --   --   HEPARINUNFRC  --    < > 0.90*  --   --  >1.10*  --   --  0.89*  --  0.77*  --   CREATININE 0.95  --   --   --  1.32*  --   --  1.15  --   --  0.91 1.08  TROPONINIHS 110*  --   --  546*  --  >24,000*  --   --   --   --   --   --    < > = values in this interval not displayed.    Estimated Creatinine Clearance: 65.1 mL/min (by C-G formula based on SCr of 1.08 mg/dL).   Medical History: Past Medical History:  Diagnosis Date   Chronic airway obstruction (HCC)    Degeneration of intervertebral disc of lumbar region    Diabetes mellitus without complication (HCC)    Essential hypertension, benign    Hyperlipidemia    Hypertension    Osteoarthritis of lower extremity    Pneumonia, organism unspecified(486)    PSA elevation 07/16/2013   Normal   Stroke (HCC)     Medications:  Medications Prior to Admission  Medication Sig Dispense Refill Last Dose/Taking   albuterol (VENTOLIN HFA) 108 (90 Base) MCG/ACT  inhaler Inhale 2 puffs into the lungs every 6 (six) hours as needed for wheezing or shortness of breath.      Alcohol Swabs (ALCOHOL WIPES) 70 % PADS USE FOR INSULIN INJECTIONS OR TO FINGER STICK AS DIRECTED BY YOUR MEDICAL PROVIDER      ascorbic acid (VITAMIN C) 500 MG tablet Take 1 tablet (500 mg total) by mouth daily. 30 tablet 0    atorvastatin (LIPITOR) 40 MG tablet Take 40 mg by mouth at bedtime.      atorvastatin (LIPITOR) 80  MG tablet Take 80 mg by mouth daily.      docusate sodium (COLACE) 100 MG capsule Take 100 mg by mouth 2 (two) times daily.      ELIQUIS 5 MG TABS tablet Take 5 mg by mouth 2 (two) times daily.      empagliflozin (JARDIANCE) 25 MG TABS tablet 1 tablet 1 day or 1 dose.      feeding supplement (ENSURE ENLIVE / ENSURE PLUS) LIQD Take 237 mLs by mouth 3 (three) times daily between meals. 21330 mL 0    fluconazole (DIFLUCAN) 100 MG tablet Take 1 tablet (100 mg total) by mouth daily. 5 tablet 0    furosemide (LASIX) 20 MG tablet Take 20 mg by mouth daily.      glucose blood (PRECISION QID TEST) test strip Use to test blood sugar 2 times daily as instructed. Dx: E11.65      HYDROcodone-acetaminophen (NORCO/VICODIN) 5-325 MG tablet Take 1 tablet by mouth every 8 (eight) hours as needed.      insulin aspart (NOVOLOG) 100 UNIT/ML injection Inject 0-6 Units into the skin 3 (three) times daily as needed for high blood sugar.      Insulin Pen Needle (PEN NEEDLES 29GX1/2") 29G X MISC Use 1x a day      ipratropium (ATROVENT HFA) 17 MCG/ACT inhaler Inhale 2 puffs into the lungs every 6 (six) hours. 1 each 0    latanoprost (XALATAN) 0.005 % ophthalmic solution Place 1 drop into both eyes at bedtime.      lisinopril (ZESTRIL) 5 MG tablet Take 5 mg by mouth daily.      Magnesium Oxide 420 MG TABS Take 1 tablet by mouth daily.      pantoprazole (PROTONIX) 40 MG tablet Take 1 tablet (40 mg total) by mouth daily. 30 tablet 0    sildenafil (VIAGRA) 100 MG tablet Take 1 tablet by mouth  as needed.      tamsulosin (FLOMAX) 0.4 MG CAPS capsule Take 0.4 mg by mouth at bedtime.      vitamin B-12 (CYANOCOBALAMIN) 100 MCG tablet Take 100 mcg by mouth daily.      [DISCONTINUED] insulin glargine (LANTUS) 100 UNIT/ML injection Inject 0.3 mLs (30 Units total) into the skin 2 (two) times daily.       Assessment: Pharmacy consulted to dose heparin for Afib in this 83 year old male admitted with resp failure.  Pt was on Eliquis 5 mg PO BID PTA.  Last dose on 3/14 @ 0600.   Baseline HL (3/14 @ 2226) = 0.9, elevated from Eliquis CrCl = 74 ml/min   Goal of Therapy:  Heparin level 0.3-0.7 units/ml aPTT 66 - 102 seconds Monitor platelets by anticoagulation protocol: Yes  Date/Time: aPTT/HL: Comment/Rate: 3/15@0836  102/>1.1 Therapeutic@1500  units/hr 3/15@1603  116/--  SUPRA-therapeutic @ 1500 units/hr 3/16@0036   80/--   Therapeutic @ 1300 units/hr 3/16@0842  63/0.89 Subtherapeutic w/ levels not correlating 3/16@1815  73/--  Therapeutic @1500  units/hr 3/17@0207      87/0.77            Therapeutic X 2 @ 1500 / hr  **Heparin drip stopped@0644 **  Plan:  - Patient underwent heart catheterization today. Per cardiology team, okay to restart heparin 2 hours after TR band removal - Restart heparin drip rate at 1500 units/hr - HL elevated from PTA Eliquis - Recheck aPTT in 8 hours after restart, HL at least once daily - Continue to monitor H&H and platelets - Will use aPTT to guide dosing until correlating with HL - CBC  daily  Bettey Costa, PharmD Clinical Pharmacist  11/21/2023,2:19 PM

## 2023-11-21 NOTE — Plan of Care (Signed)
  Problem: Education: Goal: Ability to describe self-care measures that may prevent or decrease complications (Diabetes Survival Skills Education) will improve Outcome: Progressing   Problem: Coping: Goal: Ability to adjust to condition or change in health will improve Outcome: Progressing   Problem: Fluid Volume: Goal: Ability to maintain a balanced intake and output will improve Outcome: Progressing   Problem: Metabolic: Goal: Ability to maintain appropriate glucose levels will improve Outcome: Progressing   Problem: Nutritional: Goal: Maintenance of adequate nutrition will improve Outcome: Progressing   Problem: Nutritional: Goal: Progress toward achieving an optimal weight will improve Outcome: Progressing

## 2023-11-21 NOTE — Discharge Summary (Signed)
 Physician Discharge Summary   Patient: Mathew Baker MRN: [id number] DOB: 1941/03/30  Admit date:     11/18/2023  Discharge date: Now 11/22/2023  Discharge Physician: Alford Highland   PCP: Lauro Regulus, MD   Recommendations at discharge:   Transfer to [facility] for evaluation of high risk PCI versus CABG.  Discharge Diagnoses: Principal Problem:   Acute respiratory failure with hypoxia (HCC) Active Problems:   NSTEMI (non-ST elevated myocardial infarction) (HCC)   Acute on chronic systolic CHF (congestive heart failure) (HCC)   Uncontrolled type 2 diabetes mellitus with hyperglycemia, with long-term current use of insulin (HCC)   HTN (hypertension)   HLD (hyperlipidemia)   COPD (chronic obstructive pulmonary disease) (HCC)   Paroxysmal atrial fibrillation (HCC)   Iron deficiency anemia   Overweight (BMI 25.0-29.9)    Hospital Course: 83 y.o male with significant PMH of COPD, T2DM, CVA, hyperlipidemia, hypertension, cholecystitis s/p cholecystectomy and hepatocellular carcinoma treated with Y90 who presented to the ED with chief complaints of progressive shortness of breath x 3 days.   Patient reports symptoms worsened today with associated productive cough and generalized weakness so he called EMS.  On EMS arrival patient was hypoxic with sats in the low 80s on room air.  He was treated with DuoNebs,, continuous albuterol nebulizer, magnesium and 125mg  of Solu-Medrol IV.   ED Course: Initial vital signs showed Blood pressure (!) 89/52, pulse 97, temperature 98.3 F (36.8 C), temperature source Oral, resp. rate (!) 21,  and the oxygen saturation 90% on 6L [place]. He was working hard to breath so was placed on BiPAP support.   Pertinent Labs/Diagnostics Findings: Glucose: 345. CO2 17 WBC:11.0 K/L Hgb/Hct: 9.9/32.3 PCT: negative <0.10  Lactic acid:5.9 COVID PCR: Negative,  troponin: 110 BNP: 322.9  WCB:JSEGBTDVV edema. Question left base atelectasis   Patient given 30  cc/kg of fluids and started on broad-spectrum antibiotics Ceftriaxone and Azithromycin for suspected sepsis due to pneumonia. Due to high risk for decompensation, PCCM consulted for ICU admission.  3/14: Admit with acute hypoxic respiratory failure in setting of AECOPD, questionable pneumonia and bilateral pleural effusion 3/15: Pt on and off Bipap due to shortness of breath.  Repeat troponin >24,000 Cardiology consulted tentative plan for cardiac cath 03/17 if stable from respiratory standpoint  3/16: Pt c/o nausea followed by vomiting no chest pain.  Remains off levophed gtt map 65 or higher.  Will transfer to stepdown unit and transfer service to [facility]. 3/17.  Medical team took over today.  Cardiac catheterization was done for non-STEMI.  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  Cardiology recommended transfer to tertiary care center for high risk PCI versus CABG.  Assessment and Plan: * Acute respiratory failure with hypoxia (HCC) Patient initially required BiPAP with increased work of breathing and ICU admission.  Patient now on 4 L nasal cannula.  Sepsis and pneumonia ruled out because this is NSTEMI and CHF.  NSTEMI (non-ST elevated myocardial infarction) [facility]) Cardiac cath done on 11/21/2023.  Cardiology recommends transfer to tertiary care center for evaluation of high risk PCI versus CABG.  Left main 70% distal, LAD 90% ostial, 95% proximal and 50% moderate to severe calcification, circumflex 100% occluded.  EF 25 to 30% on echocardiogram.  Cardiology recommended going back on heparin drip.  Patient on aspirin, Lipitor, Coreg.  Acute on chronic systolic CHF (congestive heart failure) (HCC) Patient on IV Lasix 40 mg  IV twice daily, Jardiance, Coreg.  Uncontrolled type 2 diabetes mellitus with hyperglycemia, with long-term current use of insulin (HCC) Patient on Lantus 10 units  daily plus sliding scale.  Adjust as needed.  HTN (hypertension) Continue Coreg  HLD (hyperlipidemia) Continue high-dose Lipitor.  Add on lipid profile since this was not done.  COPD (chronic obstructive pulmonary disease) (HCC) Patient started on antibiotics by critical care team.  Pneumonia and sepsis ruled out.  Paroxysmal atrial fibrillation (HCC) Patient currently on heparin drip but can go back on Eliquis once done with cardiac workup.  Iron deficiency anemia Last hemoglobin 9.8  Overweight (BMI 25.0-29.9) BMI 29.69         Consultants: Admitted by critical care team, cardiology Procedures performed: Cardiac catheterization Disposition: [facility] Diet recommendation:  Cardiac and Carb modified diet DISCHARGE MEDICATION: Allergies as of 11/21/2023   No Known Allergies      Medication List     STOP taking these medications    albuterol 108 (90 Base) MCG/ACT inhaler Commonly known as: VENTOLIN HFA   ascorbic acid 500 MG tablet Commonly known as: VITAMIN C   Eliquis 5 MG Tabs tablet Generic drug: apixaban   fluconazole 100 MG tablet Commonly known as: DIFLUCAN   furosemide 20 MG tablet Commonly known as: LASIX Replaced by: furosemide 10 MG/ML injection   ipratropium 17 MCG/ACT inhaler Commonly known as: ATROVENT HFA   lisinopril 5 MG tablet Commonly known as: ZESTRIL   Magnesium Oxide 420 MG Tabs   sildenafil 100 MG tablet Commonly known as: VIAGRA       TAKE these medications    Alcohol Wipes 70 % Pads USE FOR INSULIN INJECTIONS OR TO FINGER STICK AS DIRECTED BY YOUR MEDICAL PROVIDER   aspirin EC 81 MG tablet Take 1 tablet (81 mg total) by mouth daily. Swallow whole. Start taking on: November 22, 2023   atorvastatin 80 MG tablet Commonly known as: LIPITOR Take 80 mg by mouth daily. What changed: Another medication with the same name was removed. Continue taking this medication, and follow the directions you see here.    azithromycin 250 MG tablet Commonly known as: ZITHROMAX One tab po nightly for three doses   carvedilol 3.125 MG tablet Commonly known as: COREG Take 1 tablet (3.125 mg total) by mouth 2 (two) times daily with a meal.   docusate sodium 100 MG capsule Commonly known as: COLACE Take 100 mg by mouth 2 (two) times daily.   empagliflozin 25 MG Tabs tablet Commonly known as: JARDIANCE 1 tablet 1 day or 1 dose.   feeding supplement Liqd Take 237 mLs by mouth 3 (three) times daily between meals.   furosemide 10 MG/ML injection Commonly known as: LASIX Inject 4 mLs (40 mg total) into the vein 2 (two) times daily. Replaces: furosemide 20 MG tablet   heparin [address] UT/250ML infusion Inject 1,500 Units/hr into the vein continuous.   HYDROcodone-acetaminophen 5-325 MG tablet Commonly known as: NORCO/VICODIN Take 1 tablet by mouth every 8 (eight) hours as needed.   insulin aspart 100 UNIT/ML injection Commonly known as: novoLOG Inject 0-6 Units into the skin 3 (three) times daily as needed for high blood sugar.   insulin glargine 100 UNIT/ML injection Commonly known as: LANTUS Inject 0.1 mLs (10 Units total) into the skin daily. What changed:  how much to take when to take this   ipratropium-albuterol 0.5-2.5 (3) MG/3ML Soln Commonly known as: DUONEB Take 3 mLs by nebulization every 4 (four) hours.   latanoprost 0.005 %  ophthalmic solution Commonly known as: XALATAN Place 1 drop into both eyes at bedtime.   ondansetron 4 MG/2ML Soln injection Commonly known as: ZOFRAN Inject 2 mLs (4 mg total) into the vein every 6 (six) hours as needed for nausea or vomiting.   pantoprazole 40 MG tablet Commonly known as: PROTONIX Take 1 tablet (40 mg total) by mouth daily.   PEN NEEDLES 29GX1/2" 29G X Misc Use 1x a day   polyethylene glycol 17 g packet Commonly known as: MIRALAX / GLYCOLAX Take 17 g by mouth daily as needed for moderate constipation.   Precision QID Test  test strip Generic drug: glucose blood Use to test blood sugar 2 times daily as instructed. Dx: E11.65   tamsulosin 0.4 MG Caps capsule Commonly known as: FLOMAX Take 0.4 mg by mouth at bedtime.   vitamin B-12 100 MCG tablet Commonly known as: CYANOCOBALAMIN Take 100 mcg by mouth daily.        Follow-up Information     Alluri, Meryl Dare, MD. Go in 1 week(s).   Specialty: Cardiology Why: APPT ON Contact information: [address] [place] [address] [phone]                Discharge Exam: Ceasar Mons Weights   11/20/23 2244 11/21/23 0249 11/21/23 0731  Weight: 109.4 kg 109.4 kg 102.1 kg   Physical Exam HENT:     Head: Normocephalic.  Eyes:     General: Lids are normal.     Conjunctiva/sclera: Conjunctivae normal.  Cardiovascular:     Rate and Rhythm: Normal rate and regular rhythm.     Heart sounds: Normal heart sounds, S1 normal and S2 normal.  Pulmonary:     Breath sounds: Examination of the right-lower field reveals decreased breath sounds and rales. Examination of the left-lower field reveals decreased breath sounds and rales. Decreased breath sounds and rales present. No wheezing or rhonchi.  Abdominal:     Palpations: Abdomen is soft.     Tenderness: There is no abdominal tenderness.  Musculoskeletal:     Right lower leg: Swelling present.     Left lower leg: Swelling present.  Skin:    General: Skin is warm.     Findings: No rash.  Neurological:     Mental Status: He is alert and oriented to person, place, and time.      Condition at discharge: stable  The results of significant diagnostics from this hospitalization (including imaging, microbiology, ancillary and laboratory) are listed below for reference.   Imaging Studies: CARDIAC CATHETERIZATION Result Date: 11/21/2023   Prox RCA lesion is 75% stenosed.   Mid RCA-1 lesion is 75% stenosed.   Mid RCA-2 lesion is 75% stenosed.   Mid RCA to Dist RCA lesion is 90% stenosed.   Dist RCA  lesion is 50% stenosed with 50% stenosed side branch in RPAV.   Ost RCA to Prox RCA lesion is 50% stenosed.   Ost LM to Mid LM lesion is 70% stenosed.   Dist LM lesion is 90% stenosed.   Ost Cx lesion is 100% stenosed.   Ost LAD to Prox LAD lesion is 95% stenosed.   Prox LAD to Dist LAD lesion is 50% stenosed.   2nd Diag lesion is 75% stenosed.   There is moderate to severe left ventricular systolic dysfunction.   LV end diastolic pressure is moderately elevated.   The left ventricular ejection fraction is 25-35% by visual estimate.   Anticipated discharge date to be determined.  Recommend to resume Apixaban, at currently prescribed dose and frequency. Conclusion Left heart cath right radial approach Non-STEMI shortness of breath Left ventriculogram Left ventricular enlargement global hypokinesis EF around 30% Coronaries Left main large moderate to severe calcification 70% distal LAD large 90% ostial 95% proximal diffuse 50% moderate to severe calcification proximally Circumflex 100% occluded ostially moderate to severe calcification RCA large serial 75% lesions mid heavy calcification 90% mid to distal moderate calcification right dominant system moderate disease diffusely distally Intervention deferred Recommend evaluation for high risk coronary bypass surgery versus high risk PCI and stent for complex disease Medical therapy is also not unreasonable Will discuss images with tertiary care facility   ECHOCARDIOGRAM COMPLETE Result Date: 11/21/2023    ECHOCARDIOGRAM REPORT   Patient Name:   Mathew Baker Date of Exam: 11/19/2023 Medical Rec #:  [id number]           Height: Accession #:    [id number]          Weight: Date of Birth:  [birth year]           BSA: Patient Age:    82 years            BP:           99/64 mmHg Patient Gender: M                   HR:           85 bpm. Exam Location:  [facility] Procedure: 2D Echo, Cardiac Doppler and Color Doppler (Both Spectral and Color            Flow Doppler were utilized  during procedure). Indications:     Acutre respiratory distress R06.03  History:         Patient has prior history of Echocardiogram examinations.                  Stroke; Risk Factors:Hypertension.  Sonographer:     Neysa Bonito Roar Referring Phys:  Chilton Si MD Diagnosing Phys: Chilton Si MD IMPRESSIONS  1. Left ventricular ejection fraction, by estimation, is 25 to 30%. The left ventricle has severely decreased function. The left ventricle demonstrates global hypokinesis. There is mild concentric left ventricular hypertrophy. Left ventricular diastolic  parameters are consistent with Grade I diastolic dysfunction (impaired relaxation). Elevated left ventricular end-diastolic pressure.  2. Right ventricular systolic function is normal. The right ventricular size is normal. There is normal pulmonary artery systolic pressure.  3. Left atrial size was moderately dilated.  4. The mitral valve is normal in structure. Mild mitral valve regurgitation. No evidence of mitral stenosis.  5. Dimensionless index in 0.23, consistent with severe aortic stenosis. The severity of the aortic stenosis is likely underestimated by mean gradient due to low flow caused by systolic dysfunction.. The aortic valve is calcified. There is severe calcifcation of the aortic valve. There is severe thickening of the aortic valve. Aortic valve regurgitation is mild to moderate. Severe aortic valve stenosis. Aortic valve mean gradient measures 21.0 mmHg. Aortic valve Vmax measures 2.95 m/s.  6. The inferior vena cava is dilated in size with <50% respiratory variability, suggesting right atrial pressure of 15 mmHg. FINDINGS  Left Ventricle: Left ventricular ejection fraction, by estimation, is 25 to 30%. The left ventricle has severely decreased function. The left ventricle demonstrates global hypokinesis. The left ventricular internal cavity size was normal in size. There is mild concentric left ventricular hypertrophy. Left ventricular  diastolic parameters are consistent with  Grade I diastolic dysfunction (impaired relaxation). Elevated left ventricular end-diastolic pressure. Right Ventricle: The right ventricular size is normal. No increase in right ventricular wall thickness. Right ventricular systolic function is normal. There is normal pulmonary artery systolic pressure. The tricuspid regurgitant velocity is 2.09 m/s, and  with an assumed right atrial pressure of 15 mmHg, the estimated right ventricular systolic pressure is 32.5 mmHg. Left Atrium: Left atrial size was moderately dilated. Right Atrium: Right atrial size was normal in size. Pericardium: There is no evidence of pericardial effusion. Mitral Valve: The mitral valve is normal in structure. Mild mitral valve regurgitation. No evidence of mitral valve stenosis. Tricuspid Valve: The tricuspid valve is normal in structure. Tricuspid valve regurgitation is trivial. No evidence of tricuspid stenosis. Aortic Valve: Dimensionless index in 0.23, consistent with severe aortic stenosis. The severity of the aortic stenosis is likely underestimated by mean gradient due to low flow caused by systolic dysfunction. The aortic valve is calcified. There is severe calcifcation of the aortic valve. There is severe thickening of the aortic valve. Aortic valve regurgitation is mild to moderate. Severe aortic stenosis is present. Aortic valve mean gradient measures 21.0 mmHg. Aortic valve peak gradient measures  34.8 mmHg. Pulmonic Valve: The pulmonic valve was normal in structure. Pulmonic valve regurgitation is not visualized. No evidence of pulmonic stenosis. Aorta: The aortic root is normal in size and structure. Venous: The inferior vena cava is dilated in size with less than 50% respiratory variability, suggesting right atrial pressure of 15 mmHg. IAS/Shunts: No atrial level shunt detected by color flow Doppler.  LEFT VENTRICLE PLAX 2D LVIDd:         5.90 cm Diastology LVIDs:         5.00 cm LV e'  medial:    3.90 cm/s LV PW:         1.10 cm LV E/e' medial:  34.1 LV IVS:        1.30 cm LV e' lateral:   4.65 cm/s                        LV E/e' lateral: 28.6  RIGHT VENTRICLE RV Basal diam:  3.60 cm RV Mid diam:    3.40 cm RV Length:      6.60 cm RV S prime:     1340.00 cm/s TAPSE (M-mode): 1.7 cm LEFT ATRIUM LA diam:      4.00 cm LA Vol (A2C): 27.9 ml LA Vol (A4C): 80.3 ml  AORTIC VALVE                    PULMONIC VALVE AV Vmax:           295.00 cm/s  PV Vmax:       83.40 m/s AV Vmean:          173.000 cm/s PV Peak grad:  27822.2 mmHg AV VTI:            0.576 m AV Peak Grad:      34.8 mmHg AV Mean Grad:      21.0 mmHg LVOT Vmax:         67.90 cm/s LVOT Vmean:        43.200 cm/s LVOT VTI:          0.134 m LVOT/AV VTI ratio: 0.23  AORTA Ao STJ diam: 2.9 cm Ao Asc diam: 3.90 cm MV E velocity: 133.00 cm/s  TRICUSPID VALVE  TV Peak grad:   17.5 mmHg                             TV Vmax:        2.09 m/s                             TR Peak grad:   17.5 mmHg                             TR Vmax:        209.00 cm/s                              SHUNTS                             Systemic VTI: 0.13 m Chilton Si MD Electronically signed by Chilton Si MD Signature Date/Time: 11/21/2023/5:53:04 AM    Final    DG Chest Port 1 View Result Date: 11/20/2023 CLINICAL DATA:  Acute respiratory failure. EXAM: PORTABLE CHEST 1 VIEW COMPARISON:  11/19/2023 FINDINGS: Mild cardiac enlargement, stable. Bilateral posterior layering pleural effusions with veil like opacification over the lower lung zones unchanged. Overlying areas of atelectasis. Mild increase interstitial markings compatible with pulmonary edema. IMPRESSION: 1. No change in appearance of the chest compared with the prior study. 2. Cardiac enlargement with persistent pleural effusions, bibasilar atelectasis and mild interstitial edema. Electronically Signed   By: Signa Kell M.D.   On: 11/20/2023 08:47   US Venous Img Lower  Bilateral (DVT) Result Date: 11/19/2023 CLINICAL DATA:  Bilateral lower extremity edema worse on the right than the left EXAM: BILATERAL LOWER EXTREMITY VENOUS DOPPLER ULTRASOUND TECHNIQUE: Gray-scale sonography with graded compression, as well as color Doppler and duplex ultrasound were performed to evaluate the lower extremity deep venous systems from the level of the common femoral vein and including the common femoral, femoral, profunda femoral, popliteal and calf veins including the posterior tibial, peroneal and gastrocnemius veins when visible. The superficial great saphenous vein was also interrogated. Spectral Doppler was utilized to evaluate flow at rest and with distal augmentation maneuvers in the common femoral, femoral and popliteal veins. COMPARISON:  None Available. FINDINGS: RIGHT LOWER EXTREMITY Common Femoral Vein: No evidence of thrombus. Normal compressibility, respiratory phasicity and response to augmentation. Saphenofemoral Junction: No evidence of thrombus. Normal compressibility and flow on color Doppler imaging. Profunda Femoral Vein: No evidence of thrombus. Normal compressibility and flow on color Doppler imaging. Femoral Vein: No evidence of thrombus. Normal compressibility, respiratory phasicity and response to augmentation. Popliteal Vein: No evidence of thrombus. Normal compressibility, respiratory phasicity and response to augmentation. Calf Veins: No evidence of thrombus. Normal compressibility and flow on color Doppler imaging. Superficial Great Saphenous Vein: No evidence of thrombus. Normal compressibility. Venous Reflux:  None. Other Findings:  None. LEFT LOWER EXTREMITY Common Femoral Vein: No evidence of thrombus. Normal compressibility, respiratory phasicity and response to augmentation. Saphenofemoral Junction: No evidence of thrombus. Normal compressibility and flow on color Doppler imaging. Profunda Femoral Vein: No evidence of thrombus. Normal compressibility and flow  on color Doppler imaging. Femoral Vein: No evidence of thrombus. Normal compressibility, respiratory phasicity and response to augmentation. Popliteal Vein: No evidence of thrombus. Normal compressibility, respiratory phasicity and response  to augmentation. Calf Veins: No evidence of thrombus. Normal compressibility and flow on color Doppler imaging. Superficial Great Saphenous Vein: No evidence of thrombus. Normal compressibility. Venous Reflux:  None. Other Findings:  None. IMPRESSION: No evidence of deep venous thrombosis in either lower extremity. Electronically Signed   By: Malachy Moan M.D.   On: 11/19/2023 08:54   CT CHEST ABDOMEN PELVIS W CONTRAST Result Date: 11/19/2023 CLINICAL DATA:  Sepsis.  Shortness of breath.  Recent pneumonia. EXAM: CT CHEST, ABDOMEN, AND PELVIS WITH CONTRAST TECHNIQUE: Multidetector CT imaging of the chest, abdomen and pelvis was performed following the standard protocol during bolus administration of intravenous contrast. RADIATION DOSE REDUCTION: This exam was performed according to the departmental dose-optimization program which includes automated exposure control, adjustment of the mA and/or kV according to patient size and/or use of iterative reconstruction technique. CONTRAST:  OMNIPAQUE IOHEXOL 300 MG/ML  SOLN COMPARISON:  06/22/2023.  Chest x-ray today. FINDINGS: CT CHEST FINDINGS Cardiovascular: Heart borderline in size. Diffuse coronary artery and moderate aortic atherosclerosis. No evidence of aortic aneurysm or dissection. No filling defects in the pulmonary arteries to suggest pulmonary emboli. Mediastinum/Nodes: No mediastinal, hilar, or axillary adenopathy. Trachea and esophagus are unremarkable. Thyroid unremarkable. Lungs/Pleura: Small to moderate bilateral pleural effusions. 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 Airspace opacities in the lower lobes. Scattered ground-glass opacities in the lungs could reflect atelectasis or early edema.  Musculoskeletal: Chest wall soft tissues are unremarkable. No acute bony abnormality. CT ABDOMEN PELVIS FINDINGS Hepatobiliary: No focal liver abnormality is seen. Status post cholecystectomy. No biliary dilatation. Pancreas: No focal abnormality or ductal dilatation. Spleen: No focal abnormality.  Normal size. Adrenals/Urinary Tract: Bilateral perinephric stranding, stable since prior study. No suspicious renal or adrenal lesion. No stones or hydronephrosis. Urinary bladder unremarkable. Stomach/Bowel: Normal appendix. Stomach, large and small bowel grossly unremarkable. Vascular/Lymphatic: Aortic atherosclerosis. No evidence of aneurysm or adenopathy. Reproductive: Prostate enlargement Other: No free fluid or free air. Musculoskeletal: No acute bony abnormality. IMPRESSION: Small to moderate bilateral pleural effusions. Bilateral lower lobe airspace opacities could reflect atelectasis or pneumonia. Diffuse coronary artery disease.  Aortic atherosclerosis. Borderline heart size. Scattered ground-glass opacities in the lungs could reflect atelectasis or early edema. No acute findings in the abdomen or pelvis. Electronically Signed   By: Charlett Nose M.D.   On: 11/19/2023 00:18   DG Chest Port 1 View Result Date: 11/18/2023 CLINICAL DATA:  SHOB, hx of PNA EXAM: PORTABLE CHEST 1 VIEW COMPARISON:  Chest x-ray 06/22/2023 FINDINGS: The heart and mediastinal contours are unchanged. Atherosclerotic plaque. Prominent hilar vasculature. Question left base atelectasis. Increased interstitial markings. No pleural effusion. No pneumothorax. No acute osseous abnormality. IMPRESSION: 1. Pulmonary edema. 2. Question left base atelectasis. Recommend further evaluation with PA and lateral view of the chest. 3.  Aortic Atherosclerosis (ICD10-I70.0). Electronically Signed   By: Tish Frederickson M.D.   On: 11/18/2023 22:25    Microbiology: Results for orders placed or performed during the hospital encounter of 11/18/23  Resp  panel by RT-PCR (RSV, Flu A&B, Covid) Anterior Nasal Swab     Status: None   Collection Time: 11/18/23  9:43 PM   Specimen: Anterior Nasal Swab  Result Value Ref Range Status   SARS Coronavirus 2 by RT PCR NEGATIVE NEGATIVE Final    Comment: (NOTE) SARS-CoV-2 target nucleic acids are NOT DETECTED.  The SARS-CoV-2 RNA is generally detectable in upper respiratory specimens during the acute phase of infection. The lowest concentration of SARS-CoV-2 viral copies this assay can  detect is 138 copies/mL. A negative result does not preclude SARS-Cov-2 infection and should not be used as the sole basis for treatment or other patient management decisions. A negative result may occur with  improper specimen collection/handling, submission of specimen other than nasopharyngeal swab, presence of viral mutation(s) within the areas targeted by this assay, and inadequate number of viral copies(<138 copies/mL). A negative result must be combined with clinical observations, patient history, and epidemiological information. The expected result is Negative.  Fact Sheet for Patients:  BloggerCourse.com  Fact Sheet for Healthcare Providers:  SeriousBroker.it  This test is no t yet approved or cleared by the [place] FDA and  has been authorized for detection and/or diagnosis of SARS-CoV-2 by FDA under an Emergency Use Authorization (EUA). This EUA will remain  in effect (meaning this test can be used) for the duration of the COVID-19 declaration under Section 564(b)(1) of the Act, 21 U.S.C.section 360bbb-3(b)(1), unless the authorization is terminated  or revoked sooner.       Influenza A by PCR NEGATIVE NEGATIVE Final   Influenza B by PCR NEGATIVE NEGATIVE Final    Comment: (NOTE) The Xpert Xpress SARS-CoV-2/FLU/RSV plus assay is intended as an aid in the diagnosis of influenza from Nasopharyngeal swab specimens and should not be used as a  sole basis for treatment. Nasal washings and aspirates are unacceptable for Xpert Xpress SARS-CoV-2/FLU/RSV testing.  Fact Sheet for Patients: BloggerCourse.com  Fact Sheet for Healthcare Providers: SeriousBroker.it  This test is not yet approved or cleared by the [place] FDA and has been authorized for detection and/or diagnosis of SARS-CoV-2 by FDA under an Emergency Use Authorization (EUA). This EUA will remain in effect (meaning this test can be used) for the duration of the COVID-19 declaration under Section 564(b)(1) of the Act, 21 U.S.C. section 360bbb-3(b)(1), unless the authorization is terminated or revoked.     Resp Syncytial Virus by PCR NEGATIVE NEGATIVE Final    Comment: (NOTE) Fact Sheet for Patients: BloggerCourse.com  Fact Sheet for Healthcare Providers: SeriousBroker.it  This test is not yet approved or cleared by the [place] FDA and has been authorized for detection and/or diagnosis of SARS-CoV-2 by FDA under an Emergency Use Authorization (EUA). This EUA will remain in effect (meaning this test can be used) for the duration of the COVID-19 declaration under Section 564(b)(1) of the Act, 21 U.S.C. section 360bbb-3(b)(1), unless the authorization is terminated or revoked.  Performed at [facility], [address] [place], [place] [address]   Blood Culture (routine x 2)     Status: None (Preliminary result)   Collection Time: 11/18/23 10:25 PM   Specimen: BLOOD  Result Value Ref Range Status   Specimen Description BLOOD LFA  Final   Special Requests   Final    BOTTLES DRAWN AEROBIC AND ANAEROBIC Blood Culture results may not be optimal due to an inadequate volume of blood received in culture bottles   Culture   Final    NO GROWTH 3 DAYS Performed at [facility], [address], [place], [place] [address]    Report Status  PENDING  Incomplete  Blood Culture (routine x 2)     Status: None (Preliminary result)   Collection Time: 11/18/23 10:26 PM   Specimen: BLOOD  Result Value Ref Range Status   Specimen Description BLOOD RW  Final   Special Requests   Final    BOTTLES DRAWN AEROBIC AND ANAEROBIC Blood Culture results may not be optimal due to  an inadequate volume of blood received in culture bottles   Culture   Final    NO GROWTH 3 DAYS Performed at [facility], [address] [place], [place], [place] [address]    Report Status PENDING  Incomplete  MRSA Next Gen by PCR, Nasal     Status: None   Collection Time: 11/19/23  2:57 AM   Specimen: Nasal Mucosa; Nasal Swab  Result Value Ref Range Status   MRSA by PCR Next Gen NOT DETECTED NOT DETECTED Final    Comment: (NOTE) The GeneXpert MRSA Assay (FDA approved for NASAL specimens only), is one component of a comprehensive MRSA colonization surveillance program. It is not intended to diagnose MRSA infection nor to guide or monitor treatment for MRSA infections. Test performance is not FDA approved in patients less than 38 years old. Performed at [facility], [address] [place], [place] [address]   Respiratory (~20 pathogens) panel by PCR     Status: None   Collection Time: 11/19/23  3:11 PM   Specimen: Nasopharyngeal Swab; Respiratory  Result Value Ref Range Status   Adenovirus NOT DETECTED NOT DETECTED Final   Coronavirus 229E NOT DETECTED NOT DETECTED Final    Comment: (NOTE) The Coronavirus on the Respiratory Panel, DOES NOT test for the novel  Coronavirus (2019 nCoV)    Coronavirus HKU1 NOT DETECTED NOT DETECTED Final   Coronavirus NL63 NOT DETECTED NOT DETECTED Final   Coronavirus OC43 NOT DETECTED NOT DETECTED Final   Metapneumovirus NOT DETECTED NOT DETECTED Final   Rhinovirus / Enterovirus NOT DETECTED NOT DETECTED Final   Influenza A NOT DETECTED NOT DETECTED Final   Influenza B NOT DETECTED NOT DETECTED Final    Parainfluenza Virus 1 NOT DETECTED NOT DETECTED Final   Parainfluenza Virus 2 NOT DETECTED NOT DETECTED Final   Parainfluenza Virus 3 NOT DETECTED NOT DETECTED Final   Parainfluenza Virus 4 NOT DETECTED NOT DETECTED Final   Respiratory Syncytial Virus NOT DETECTED NOT DETECTED Final   Bordetella pertussis NOT DETECTED NOT DETECTED Final   Bordetella Parapertussis NOT DETECTED NOT DETECTED Final   Chlamydophila pneumoniae NOT DETECTED NOT DETECTED Final   Mycoplasma pneumoniae NOT DETECTED NOT DETECTED Final    Comment: Performed at [facility], [address], [place], [place] [address]    Labs: CBC: Recent Labs  Lab 11/18/23 2143 11/19/23 0347 11/20/23 0346 11/21/23 0207  WBC 11.0* 9.4 21.6* 15.3*  NEUTROABS 9.4*  --   --   --   HGB 9.9* 9.7* 9.7* 9.8*  HCT 32.3* 31.6* 30.3* 31.3*  MCV 96.4 95.5 94.1 97.8  PLT 221 218 209 218   Basic Metabolic Panel: Recent Labs  Lab 11/18/23 2143 11/19/23 0347 11/20/23 0346 11/21/23 0207 11/21/23 0416  NA 136 138 137 121* 139  K 3.9 3.9 4.9 4.8 4.7  CL 108 105 107 89* 104  CO2 17* 19* 24 23 27   GLUCOSE 345* 392* 160* 147* 168*  BUN 17 22 40* 47* 51*  CREATININE 0.95 1.32* 1.15 0.91 1.08  CALCIUM 8.3* 8.3* 8.3* 7.0* 8.1*  MG  --  2.2 2.3 2.1  --   PHOS  --  3.9 3.1 2.2*  --    Liver Function Tests: Recent Labs  Lab 11/18/23 2143 11/19/23 0836  AST 26 122*  ALT 16 27  ALKPHOS 75 59  BILITOT 0.9 0.6  PROT 6.2* 6.2*  ALBUMIN 3.2* 3.0*   CBG: Recent Labs  Lab 11/20/23 1537 11/20/23 2109 11/21/23  1308 11/21/23 1022 11/21/23 1210  GLUCAP 166* 149* 166* 173* 208*    Discharge time spent: greater than 30 minutes.  Signed: Alford Highland, MD [facility] 11/21/2023  Addendum we thought patient will go to [facility] on 11/21/2023.  Now will be going on 11/22/2023.

## 2023-11-21 NOTE — Progress Notes (Signed)
 Patient report given to Cath lab. Patient's heparin was stopped. Offered to call family and patient refused. Patient took ASA prior to transport. Patient also took cell phone and extension cord to cath lab and that is all the belongings the patient had. Transported with no distress on 3[facility]/[place]. Will transfer care at this time.

## 2023-11-21 NOTE — Assessment & Plan Note (Signed)
Last hemoglobin 9.8.

## 2023-11-21 NOTE — Assessment & Plan Note (Signed)
 Patient on Lantus 10 units daily plus sliding scale.  Adjust as needed.

## 2023-11-21 NOTE — CV Procedure (Signed)
 Brief cardiac cath note  Indication non-STEMI Right radial approach  Left ventriculogram severely depressed left ventricular function globally EF 25 to 30% left ventricular enlargement  Coronaries Left main severe disease 50-75 ostially heavy calcification LAD large 75% proximal 90% proximal diffuse 70% mid to distal heavy calcification Circumflex 100% occluded ostially heavy calcification TIMI 0 flow RCA very large heavy calcification serial 75 mid and 95% mid to distal Right dominant system Minimal collaterals  Intervention deferred Recommend surgical consult versus high risk complex PCI to tertiary care center Recommend aggressive GDMT for cardiomyopathy non-STEMI severe coronary disease Patient needs to be optimized with his respiratory status and what appears to be COPD and heart failure  Resume heparin 2 hours after sheath removal  Will review images with tertiary care center Recommend aggressive medical therapy GDMT and interim  Full cath note to follow  Dorothyann Peng MD 11/21/23

## 2023-11-21 NOTE — Assessment & Plan Note (Signed)
 Patient currently on heparin drip but can go back on Eliquis once done with cardiac workup.

## 2023-11-21 NOTE — Assessment & Plan Note (Signed)
 BMI 29.69

## 2023-11-21 NOTE — Progress Notes (Signed)
 PHARMACY - ANTICOAGULATION CONSULT NOTE  Pharmacy Consult for Heparin  Indication: atrial fibrillation  No Known Allergies  Patient Measurements: Height: 6\' 1"  (185.4 cm) Weight: 109.4 kg (241 lb 2.9 oz) IBW/kg (Calculated) : 79.9 Heparin Dosing Weight: 100.5 kg   Vital Signs: Temp: 98.6 F (37 C) (03/17 0000) Temp Source: Oral (03/17 0000) BP: 105/62 (03/17 0200) Pulse Rate: 78 (03/17 0200)  Labs: Recent Labs    11/18/23 2143 11/18/23 2143 11/18/23 2226 11/18/23 2340 11/19/23 0347 11/19/23 0836 11/19/23 1603 11/20/23 0346 11/20/23 0842 11/20/23 1815 11/21/23 0207  HGB 9.9*  --   --   --  9.7*  --   --  9.7*  --   --  9.8*  HCT 32.3*  --   --   --  31.6*  --   --  30.3*  --   --  31.3*  PLT 221  --   --   --  218  --   --  209  --   --  218  APTT  --    < > 28  --   --  102*   < >  --  63* 73* 87*  LABPROT  --   --  15.7*  --   --   --   --   --   --   --   --   INR  --   --  1.2  --   --   --   --   --   --   --   --   HEPARINUNFRC  --    < > 0.90*  --   --  >1.10*  --   --  0.89*  --  0.77*  CREATININE 0.95  --   --   --  1.32*  --   --  1.15  --   --   --   TROPONINIHS 110*  --   --  546*  --  >24,000*  --   --   --   --   --    < > = values in this interval not displayed.    Estimated Creatinine Clearance: 63.1 mL/min (by C-G formula based on SCr of 1.15 mg/dL).   Medical History: Past Medical History:  Diagnosis Date   Chronic airway obstruction (HCC)    Degeneration of intervertebral disc of lumbar region    Diabetes mellitus without complication (HCC)    Essential hypertension, benign    Hyperlipidemia    Hypertension    Osteoarthritis of lower extremity    Pneumonia, organism unspecified(486)    PSA elevation 07/16/2013   Normal   Stroke (HCC)     Medications:  Medications Prior to Admission  Medication Sig Dispense Refill Last Dose/Taking   albuterol (VENTOLIN HFA) 108 (90 Base) MCG/ACT inhaler Inhale 2 puffs into the lungs every 6 (six)  hours as needed for wheezing or shortness of breath.      Alcohol Swabs (ALCOHOL WIPES) 70 % PADS USE FOR INSULIN INJECTIONS OR TO FINGER STICK AS DIRECTED BY YOUR MEDICAL PROVIDER      ascorbic acid (VITAMIN C) 500 MG tablet Take 1 tablet (500 mg total) by mouth daily. 30 tablet 0    atorvastatin (LIPITOR) 40 MG tablet Take 40 mg by mouth at bedtime.      atorvastatin (LIPITOR) 80 MG tablet Take 80 mg by mouth daily.      docusate sodium (COLACE) 100 MG capsule Take 100 mg by  mouth 2 (two) times daily.      ELIQUIS 5 MG TABS tablet Take 5 mg by mouth 2 (two) times daily.      empagliflozin (JARDIANCE) 25 MG TABS tablet 1 tablet 1 day or 1 dose.      feeding supplement (ENSURE ENLIVE / ENSURE PLUS) LIQD Take 237 mLs by mouth 3 (three) times daily between meals. 21330 mL 0    fluconazole (DIFLUCAN) 100 MG tablet Take 1 tablet (100 mg total) by mouth daily. 5 tablet 0    furosemide (LASIX) 20 MG tablet Take 20 mg by mouth daily.      glucose blood (PRECISION QID TEST) test strip Use to test blood sugar 2 times daily as instructed. Dx: E11.65      HYDROcodone-acetaminophen (NORCO/VICODIN) 5-325 MG tablet Take 1 tablet by mouth every 8 (eight) hours as needed.      insulin aspart (NOVOLOG) 100 UNIT/ML injection Inject 0-6 Units into the skin 3 (three) times daily as needed for high blood sugar.      insulin glargine (LANTUS) 100 UNIT/ML injection Inject 0.3 mLs (30 Units total) into the skin 2 (two) times daily.      Insulin Pen Needle (PEN NEEDLES 29GX1/2") 29G X MISC Use 1x a day      ipratropium (ATROVENT HFA) 17 MCG/ACT inhaler Inhale 2 puffs into the lungs every 6 (six) hours. 1 each 0    latanoprost (XALATAN) 0.005 % ophthalmic solution Place 1 drop into both eyes at bedtime.      lisinopril (ZESTRIL) 5 MG tablet Take 5 mg by mouth daily.      Magnesium Oxide 420 MG TABS Take 1 tablet by mouth daily.      pantoprazole (PROTONIX) 40 MG tablet Take 1 tablet (40 mg total) by mouth daily. 30  tablet 0    sildenafil (VIAGRA) 100 MG tablet Take 1 tablet by mouth as needed.      tamsulosin (FLOMAX) 0.4 MG CAPS capsule Take 0.4 mg by mouth at bedtime.      vitamin B-12 (CYANOCOBALAMIN) 100 MCG tablet Take 100 mcg by mouth daily.       Assessment: Pharmacy consulted to dose heparin for Afib in this 83 year old male admitted with resp failure.  Pt was on Eliquis 5 mg PO BID PTA.  Last dose on 3/14 @ 0600.   Baseline HL (3/14 @ 2226) = 0.9, elevated from Eliquis CrCl = 74 ml/min   Goal of Therapy:  Heparin level 0.3-0.7 units/ml aPTT 66 - 102 seconds Monitor platelets by anticoagulation protocol: Yes  Date/Time: aPTT/HL: Comment/Rate: 3/15@0836  102/>1.1 Therapeutic@1500  units/hr 3/15@1603  116/--  SUPRA-therapeutic @ 1500 units/hr 3/16@0036   80/--   Therapeutic @ 1300 units/hr 3/16@0842  63/0.89 Subtherapeutic w/ levels not correlating 3/16@1815  73/--  Therapeutic @1500  units/hr 3/17@0207      87/0.77            Therapeutic X 2 @ 1500 / hr  Plan:  3/17 @ 0207:  aPTT = 87,  HL = 0.77 - aPTT therapeutic X 2,  HL elevated from PTA Eliquis - Continue heparin drip rate at 1500 units/hr - Recheck aPTT and HL on 3/18 with AM labs  - Continue to monitor H&H and platelets - Will use aPTT to guide dosing until correlating with HL - CBC daily  Madinah Quarry D, PharmD Clinical Pharmacist  11/21/2023,2:48 AM

## 2023-11-21 NOTE — Assessment & Plan Note (Signed)
 Patient started on antibiotics by critical care team.  Pneumonia and sepsis ruled out.

## 2023-11-22 DIAGNOSIS — E669 Obesity, unspecified: Secondary | ICD-10-CM

## 2023-11-22 DIAGNOSIS — J9601 Acute respiratory failure with hypoxia: Secondary | ICD-10-CM | POA: Diagnosis not present

## 2023-11-22 DIAGNOSIS — Z794 Long term (current) use of insulin: Secondary | ICD-10-CM

## 2023-11-22 DIAGNOSIS — I214 Non-ST elevation (NSTEMI) myocardial infarction: Secondary | ICD-10-CM

## 2023-11-22 DIAGNOSIS — I480 Paroxysmal atrial fibrillation: Secondary | ICD-10-CM

## 2023-11-22 DIAGNOSIS — D508 Other iron deficiency anemias: Secondary | ICD-10-CM

## 2023-11-22 DIAGNOSIS — E1165 Type 2 diabetes mellitus with hyperglycemia: Secondary | ICD-10-CM

## 2023-11-22 DIAGNOSIS — J439 Emphysema, unspecified: Secondary | ICD-10-CM

## 2023-11-22 DIAGNOSIS — I5023 Acute on chronic systolic (congestive) heart failure: Secondary | ICD-10-CM | POA: Diagnosis not present

## 2023-11-22 LAB — CBC
HCT: 31.7 % — ABNORMAL LOW (ref 39.0–52.0)
Hemoglobin: 9.8 g/dL — ABNORMAL LOW (ref 13.0–17.0)
MCH: 30.1 pg (ref 26.0–34.0)
MCHC: 30.9 g/dL (ref 30.0–36.0)
MCV: 97.2 fL (ref 80.0–100.0)
Platelets: 214 10*3/uL (ref 150–400)
RBC: 3.26 MIL/uL — ABNORMAL LOW (ref 4.22–5.81)
RDW: 18 % — ABNORMAL HIGH (ref 11.5–15.5)
WBC: 10.7 10*3/uL — ABNORMAL HIGH (ref 4.0–10.5)
nRBC: 0.3 % — ABNORMAL HIGH (ref 0.0–0.2)

## 2023-11-22 LAB — BASIC METABOLIC PANEL
Anion gap: 5 (ref 5–15)
BUN: 52 mg/dL — ABNORMAL HIGH (ref 8–23)
CO2: 30 mmol/L (ref 22–32)
Calcium: 8.7 mg/dL — ABNORMAL LOW (ref 8.9–10.3)
Chloride: 103 mmol/L (ref 98–111)
Creatinine, Ser: 1.13 mg/dL (ref 0.61–1.24)
GFR, Estimated: >60 mL/min (ref >60)
Glucose, Bld: 211 mg/dL — ABNORMAL HIGH (ref 70–99)
Potassium: 4.5 mmol/L (ref 3.5–5.1)
Sodium: 138 mmol/L (ref 135–145)

## 2023-11-22 LAB — GLUCOSE, CAPILLARY
Glucose-Capillary: 237 mg/dL — ABNORMAL HIGH (ref 70–99)
Glucose-Capillary: 263 mg/dL — ABNORMAL HIGH (ref 70–99)

## 2023-11-22 LAB — APTT: aPTT: 104 s — ABNORMAL HIGH (ref 24–36)

## 2023-11-22 LAB — HEPARIN LEVEL (UNFRACTIONATED): Heparin Unfractionated: 0.57 [IU]/mL (ref 0.30–0.70)

## 2023-11-22 NOTE — Progress Notes (Signed)
 [facility] CLINIC CARDIOLOGY PROGRESS NOTE   Patient ID: Mathew Baker MRN: [id number] DOB/AGE: 1940/12/16 83 y.o.  Admit date: 11/18/2023 Referring Physician Dr Larinda Buttery Primary Physician Dareen Piano, Marya Amsler, MD Primary Cardiologist [facility] cardiology Reason for Consultation NSTEMI   HPI: Mathew Baker is a 83 y.o. male  with a past medical history of  of COPD, T2DM, CVA, hyperlipidemia, hypertension, cholecystitis s/p cholecystectomy and hepatocellular carcinoma treated with Y90 who presented to the ED with chief complaints of progressive shortness of breath x 3 days. Patient states he has also been experiencing some chest tightness. Of note, his breathing has been much worse recently. Patient has not ever had a heart catheterization before but he is agreeable to proceed with this on Monday given elevated troponins and worsening SOB/chest pain. Family at bedside, discussed plan and they are agreeable.  Interval History:  -Patient seen and examined at bedside, resting comfortably.  No events overnight.  Denies any shortness of breath or chest pain symptoms. -BP and heart rate overall stable. -He is pending transfer to [facility], currently awaiting a bed.  Review of systems complete and found to be negative unless listed above    Vitals:   11/22/23 0600 11/22/23 0700 11/22/23 0800 11/22/23 0900  BP: 99/63 108/61 (!) 105/56 (!) 111/45  Pulse: 71 72 70 78  Resp:  19 14 18   Temp:    97.9 F (36.6 C)  TempSrc:    Oral  SpO2: 97% 100% 100% 98%  Weight:      Height:         Intake/Output Summary (Last 24 hours) at 11/22/2023 1039 Last data filed at 11/22/2023 0700 Gross per 24 hour  Intake 195.8 ml  Output 4700 ml  Net -4504.2 ml     PHYSICAL EXAM General: awake, well nourished, in no acute distress. HEENT: Normocephalic and atraumatic. Neck: No JVD.  Lungs: on bipap, decreased breath sounds bilaterally, coarse throughout Heart: HRRR. Normal S1 and S2 without gallops or  murmurs.  Abdomen: Non-distended appearing.  Msk: Normal strength and tone for age. Extremities: Warm and well perfused. No clubbing, cyanosis. no edema.  Neuro: Alert and oriented X 3. Psych: Answers questions appropriately   LABS: Basic Metabolic Panel: Recent Labs    11/20/23 0346 11/21/23 0207 11/21/23 0416 11/22/23 0524  NA 137 121* 139 138  K 4.9 4.8 4.7 4.5  CL 107 89* 104 103  CO2 24 23 27 30   GLUCOSE 160* 147* 168* 211*  BUN 40* 47* 51* 52*  CREATININE 1.15 0.91 1.08 1.13  CALCIUM 8.3* 7.0* 8.1* 8.7*  MG 2.3 2.1  --   --   PHOS 3.1 2.2*  --   --    Liver Function Tests: No results for input(s): "AST", "ALT", "ALKPHOS", "BILITOT", "PROT", "ALBUMIN" in the last 72 hours.  No results for input(s): "LIPASE", "AMYLASE" in the last 72 hours. CBC: Recent Labs    11/21/23 0207 11/22/23 0524  WBC 15.3* 10.7*  HGB 9.8* 9.8*  HCT 31.3* 31.7*  MCV 97.8 97.2  PLT 218 214   Cardiac Enzymes: No results for input(s): "CKTOTAL", "CKMB", "CKMBINDEX", "TROPONINIHS" in the last 72 hours.  BNP: Recent Labs    11/19/23 1114  BNP 917.8*   D-Dimer: No results for input(s): "DDIMER" in the last 72 hours.  Hemoglobin A1C: No results for input(s): "HGBA1C" in the last 72 hours.  Fasting Lipid Panel: Recent Labs    11/21/23 0416  CHOL 87  HDL 48  LDLCALC 26  TRIG 63  CHOLHDL 1.8   Thyroid Function Tests: No results for input(s): "TSH", "T4TOTAL", "T3FREE", "THYROIDAB" in the last 72 hours.  Invalid input(s): "FREET3" Anemia Panel: No results for input(s): "VITAMINB12", "FOLATE", "FERRITIN", "TIBC", "IRON", "RETICCTPCT" in the last 72 hours.  CARDIAC CATHETERIZATION Result Date: 11/21/2023   Prox RCA lesion is 75% stenosed.   Mid RCA-1 lesion is 75% stenosed.   Mid RCA-2 lesion is 75% stenosed.   Mid RCA to Dist RCA lesion is 90% stenosed.   Dist RCA lesion is 50% stenosed with 50% stenosed side branch in RPAV.   Ost RCA to Prox RCA lesion is 50% stenosed.   Ost  LM to Mid LM lesion is 70% stenosed.   Dist LM lesion is 90% stenosed.   Ost Cx lesion is 100% stenosed.   Ost LAD to Prox LAD lesion is 95% stenosed.   Prox LAD to Dist LAD lesion is 50% stenosed.   2nd Diag lesion is 75% stenosed.   There is moderate to severe left ventricular systolic dysfunction.   LV end diastolic pressure is moderately elevated.   The left ventricular ejection fraction is 25-35% by visual estimate.   Anticipated discharge date to be determined.   Recommend to resume Apixaban, at currently prescribed dose and frequency. Conclusion Left heart cath right radial approach Non-STEMI shortness of breath Left ventriculogram Left ventricular enlargement global hypokinesis EF around 30% Coronaries Left main large moderate to severe calcification 70% distal LAD large 90% ostial 95% proximal diffuse 50% moderate to severe calcification proximally Circumflex 100% occluded ostially moderate to severe calcification RCA large serial 75% lesions mid heavy calcification 90% mid to distal moderate calcification right dominant system moderate disease diffusely distally Intervention deferred Recommend evaluation for high risk coronary bypass surgery versus high risk PCI and stent for complex disease Medical therapy is also not unreasonable Will discuss images with tertiary care facility     ECHO as above  TELEMETRY reviewed by me 11/22/23: Sinus rhythm rate 70s   ASSESSMENT AND PLAN:  Principal Problem:   Acute respiratory failure with hypoxia (HCC) Active Problems:   HTN (hypertension)   HLD (hyperlipidemia)   Iron deficiency anemia   Uncontrolled type 2 diabetes mellitus with hyperglycemia, with long-term current use of insulin (HCC)   NSTEMI (non-ST elevated myocardial infarction) (HCC)   Acute on chronic systolic CHF (congestive heart failure) (HCC)   Overweight (BMI 25.0-29.9)   COPD (chronic obstructive pulmonary disease) (HCC)   Paroxysmal atrial fibrillation (HCC)   # NSTEMI #  Coronary artery disease # Acute HFrEF # Low-flow low gradient, moderate to severe aortic stenosis Patient initially presented to the ED with complaints of shortness of breath and associated chest tightness worsening for 3 days.  Troponins 110 --> 564 --> 24,000.  Echo revealed reduced EF at 25-30% (this is new), global hypokinesis, global hypokinesis, grade 1 diastolic dysfunction, likely severe aortic stenosis with AV mean gradient 21 mmHg, V-max 2.95.  Suspect low flow low gradient aortic stenosis -Continue on heparin gtt -Continue atorvastatin 80 mg daily, aspirin 81 mg daily. -Continue carvedilol 3.125 mg twice daily, Jardiance 25 mg daily.  Can consider further additions to GDMT as BP and renal function allow. -Continue IV Lasix 40 mg twice daily. -Optimize lytes, K>4 and Mag>2 -Multivessel disease and echo results exhibiting likely severe aortic stenosis discussed in detail with patient and daughter at bedside.  Per patient he would like to pursue possible invasive evaluation and intervention.  They have requested transfer to  [facility] for further evaluation.  Case discussed by Dr. Juliann Pares with [facility] team, they have accepted him for transfer and the accepting physician is Dr. Deberah Castle.  Pending bed placement.   # Paroxysmal atrial fibrillation -Continue on heparin gtt -Labetalol as needed for RVR.  Will consider adding p.o. beta-blocker pending BP. -Currently patient is rate controlled   # Hypertension -Additions to GDMT as BP and renal function allow as above   # Hyperlipidemia -On lipitor 80 mg  Patient stable for transfer to [facility] once bed available.  Will plan for follow-up in our clinic 1 to 2 weeks from discharge.   This patient's plan of care was discussed and created with Dr. Corky Sing and he is in agreement.    Signed: Gale Journey, PA-C 11/22/2023, 10:39 AM [facility] Cardiology

## 2023-11-22 NOTE — Assessment & Plan Note (Signed)
 BMI 31.09 with current height and weight in computer.

## 2023-11-22 NOTE — Progress Notes (Signed)
 Report to RN at room [facility] to [facility].  [facility] transport contacted for transportation

## 2023-11-22 NOTE — Progress Notes (Signed)
 Progress Note   Patient: Mathew Baker ZOX:[id number] DOB: 29-Jun-1941 DOA: 11/18/2023     4 DOS: the patient was seen and examined on 11/22/2023   Brief hospital course: 83 y.o male with significant PMH of COPD, T2DM, CVA, hyperlipidemia, hypertension, cholecystitis s/p cholecystectomy and hepatocellular carcinoma treated with Y90 who presented to the ED with chief complaints of progressive shortness of breath x 3 days.   Patient reports symptoms worsened today with associated productive cough and generalized weakness so he called EMS.  On EMS arrival patient was hypoxic with sats in the low 80s on room air.  He was treated with DuoNebs,, continuous albuterol nebulizer, magnesium and 125mg  of Solu-Medrol IV.   ED Course: Initial vital signs showed Blood pressure (!) 89/52, pulse 97, temperature 98.3 F (36.8 C), temperature source Oral, resp. rate (!) 21,  and the oxygen saturation 90% on 6L [place]. He was working hard to breath so was placed on BiPAP support.   Pertinent Labs/Diagnostics Findings: Glucose: 345. CO2 17 WBC:11.0 K/L Hgb/Hct: 9.9/32.3 PCT: negative <0.10  Lactic acid:5.9 COVID PCR: Negative,  troponin: 110 BNP: 322.9  WJX:BJYNWGNFA edema. Question left base atelectasis   Patient given 30 cc/kg of fluids and started on broad-spectrum antibiotics Ceftriaxone and Azithromycin for suspected sepsis due to pneumonia. Due to high risk for decompensation, PCCM consulted for ICU admission.  3/14: Admit with acute hypoxic respiratory failure in setting of AECOPD, questionable pneumonia and bilateral pleural effusion 3/15: Pt on and off Bipap due to shortness of breath.  Repeat troponin >24,000 Cardiology consulted tentative plan for cardiac cath 03/17 if stable from respiratory standpoint  3/16: Pt c/o nausea followed by vomiting no chest pain.  Remains off levophed gtt map 65 or higher.  Will transfer to stepdown unit and transfer service to [facility]. 3/17.  Medical team took over  today.  Cardiac catheterization was done for non-STEMI.  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  Cardiology recommended transfer to tertiary care center for high risk PCI versus CABG. 3/18.  Patient will go to [facility] today.  LDL 26, hemoglobin 9.8, creatinine 1.13.  Assessment and Plan: * Acute respiratory failure with hypoxia (HCC) Patient initially required BiPAP with increased work of breathing and ICU admission.  Patient now on 2 L nasal cannula and saturating well.  Hopefully can titrate off..  Sepsis and pneumonia ruled out because this is NSTEMI and CHF.  NSTEMI (non-ST elevated myocardial infarction)  [facility]) Cardiac cath done on 11/21/2023.  Cardiology recommends transfer to tertiary care center for evaluation of high risk PCI versus CABG.  Left main 70% distal, LAD 90% ostial, 95% proximal and 50% moderate to severe calcification, circumflex 100% occluded.  EF 25 to 30% on echocardiogram.  Cardiology recommended going back on heparin drip.  Patient on aspirin, Lipitor, Coreg.  LDL 26.  Acute on chronic systolic CHF (congestive heart failure) (HCC) Patient on IV Lasix 40 mg IV twice daily, Jardiance, Coreg.  Uncontrolled type 2 diabetes mellitus with hyperglycemia, with long-term current use of insulin (HCC) Patient on Lantus 10 units daily plus sliding scale.  Adjust as needed.  HTN (hypertension) Continue Coreg  HLD (hyperlipidemia) Continue high-dose Lipitor.  LDL 26  COPD (chronic obstructive pulmonary disease) (HCC) Patient started on antibiotics by critical care team.  Finish up course of Zithromax pneumonia and sepsis ruled out.  Paroxysmal atrial fibrillation (HCC) Patient currently on heparin drip but can  go back on Eliquis once done with cardiac workup.  Iron deficiency anemia Last hemoglobin 9.8  Obesity (BMI 30-39.9) BMI 31.09 with current height and weight  in computer.        Subjective: Patient feeling okay.  No chest pain.  Some shortness of breath.  Being transferred to [facility] today for high risk PCI versus CABG.  Admitted with shortness of breath found to have NSTEMI.  Physical Exam: Vitals:   11/22/23 1000 11/22/23 1100 11/22/23 1200 11/22/23 1300  BP: 109/61 114/71 (!) 103/59 (!) 102/48  Pulse: 70 71 67 70  Resp: 15 13 16 19   Temp:   97.9 F (36.6 C)   TempSrc:      SpO2: 95% 98% 100% 99%  Weight:      Height:       Physical Exam HENT:     Head: Normocephalic.  Eyes:     General: Lids are normal.     Conjunctiva/sclera: Conjunctivae normal.  Cardiovascular:     Rate and Rhythm: Normal rate and regular rhythm.     Heart sounds: Normal heart sounds, S1 normal and S2 normal.  Pulmonary:     Breath sounds: Examination of the right-lower field reveals decreased breath sounds. Examination of the left-lower field reveals decreased breath sounds. Decreased breath sounds present. No wheezing, rhonchi or rales.  Abdominal:     Palpations: Abdomen is soft.     Tenderness: There is no abdominal tenderness.  Musculoskeletal:     Right lower leg: Swelling present.     Left lower leg: Swelling present.  Skin:    General: Skin is warm.     Findings: No rash.  Neurological:     Mental Status: He is alert and oriented to person, place, and time.     Data Reviewed: Creatinine 1.13, white blood count 10.7, hemoglobin 9.8, platelet count 214 Family Communication: Family at bedside  Disposition: Status is: Inpatient Remains inpatient appropriate because:Discharged to today to [facility]  Planned Discharge Destination: [facility]    Time spent: 32 minutes Case discussed with cardiology.  Author: Alford Highland, MD 11/22/2023 1:05 PM  For on call review [email or web address].

## 2023-11-22 NOTE — Progress Notes (Addendum)
 PHARMACY - ANTICOAGULATION CONSULT NOTE  Pharmacy Consult for Heparin  Indication: atrial fibrillation  No Known Allergies  Patient Measurements: Height: 6\' 1"  (185.4 cm) Weight: 106.9 kg (235 lb 10.8 oz) IBW/kg (Calculated) : 79.9 Heparin Dosing Weight: 100.5 kg   Vital Signs: Temp: 98.3 F (36.8 C) (03/18 0400) Temp Source: Oral (03/18 0400) BP: 99/63 (03/18 0600) Pulse Rate: 71 (03/18 0600)  Labs: Recent Labs    11/19/23 0836 11/19/23 1603 11/20/23 0346 11/20/23 0842 11/20/23 1815 11/21/23 0207 11/21/23 0416 11/21/23 2243 11/22/23 0524  HGB  --    < > 9.7*  --   --  9.8*  --   --  9.8*  HCT  --   --  30.3*  --   --  31.3*  --   --  31.7*  PLT  --   --  209  --   --  218  --   --  214  APTT 102*   < >  --  63*   < > 87*  --  97* 104*  HEPARINUNFRC >1.10*  --   --  0.89*  --  0.77*  --   --  0.57  CREATININE  --    < > 1.15  --   --  0.91 1.08  --  1.13  TROPONINIHS >24,000*  --   --   --   --   --   --   --   --    < > = values in this interval not displayed.    Estimated Creatinine Clearance: 63.5 mL/min (by C-G formula based on SCr of 1.13 mg/dL).   Medical History: Past Medical History:  Diagnosis Date   Chronic airway obstruction (HCC)    Degeneration of intervertebral disc of lumbar region    Diabetes mellitus without complication (HCC)    Essential hypertension, benign    Hyperlipidemia    Hypertension    Osteoarthritis of lower extremity    Pneumonia, organism unspecified(486)    PSA elevation 07/16/2013   Normal   Stroke (HCC)     Medications:  Medications Prior to Admission  Medication Sig Dispense Refill Last Dose/Taking   albuterol (VENTOLIN HFA) 108 (90 Base) MCG/ACT inhaler Inhale 2 puffs into the lungs every 6 (six) hours as needed for wheezing or shortness of breath.      Alcohol Swabs (ALCOHOL WIPES) 70 % PADS USE FOR INSULIN INJECTIONS OR TO FINGER STICK AS DIRECTED BY YOUR MEDICAL PROVIDER      ascorbic acid (VITAMIN C) 500 MG  tablet Take 1 tablet (500 mg total) by mouth daily. 30 tablet 0    atorvastatin (LIPITOR) 40 MG tablet Take 40 mg by mouth at bedtime.      atorvastatin (LIPITOR) 80 MG tablet Take 80 mg by mouth daily.      docusate sodium (COLACE) 100 MG capsule Take 100 mg by mouth 2 (two) times daily.      ELIQUIS 5 MG TABS tablet Take 5 mg by mouth 2 (two) times daily.      empagliflozin (JARDIANCE) 25 MG TABS tablet 1 tablet 1 day or 1 dose.      feeding supplement (ENSURE ENLIVE / ENSURE PLUS) LIQD Take 237 mLs by mouth 3 (three) times daily between meals. 21330 mL 0    fluconazole (DIFLUCAN) 100 MG tablet Take 1 tablet (100 mg total) by mouth daily. 5 tablet 0    furosemide (LASIX) 20 MG tablet Take 20 mg by mouth daily.  glucose blood (PRECISION QID TEST) test strip Use to test blood sugar 2 times daily as instructed. Dx: E11.65      HYDROcodone-acetaminophen (NORCO/VICODIN) 5-325 MG tablet Take 1 tablet by mouth every 8 (eight) hours as needed.      insulin aspart (NOVOLOG) 100 UNIT/ML injection Inject 0-6 Units into the skin 3 (three) times daily as needed for high blood sugar.      Insulin Pen Needle (PEN NEEDLES 29GX1/2") 29G X MISC Use 1x a day      ipratropium (ATROVENT HFA) 17 MCG/ACT inhaler Inhale 2 puffs into the lungs every 6 (six) hours. 1 each 0    latanoprost (XALATAN) 0.005 % ophthalmic solution Place 1 drop into both eyes at bedtime.      lisinopril (ZESTRIL) 5 MG tablet Take 5 mg by mouth daily.      Magnesium Oxide 420 MG TABS Take 1 tablet by mouth daily.      pantoprazole (PROTONIX) 40 MG tablet Take 1 tablet (40 mg total) by mouth daily. 30 tablet 0    sildenafil (VIAGRA) 100 MG tablet Take 1 tablet by mouth as needed.      tamsulosin (FLOMAX) 0.4 MG CAPS capsule Take 0.4 mg by mouth at bedtime.      vitamin B-12 (CYANOCOBALAMIN) 100 MCG tablet Take 100 mcg by mouth daily.      [DISCONTINUED] insulin glargine (LANTUS) 100 UNIT/ML injection Inject 0.3 mLs (30 Units total)  into the skin 2 (two) times daily.       Assessment: Pharmacy consulted to dose heparin for Afib in this 83 year old male admitted with resp failure.  Pt was on Eliquis 5 mg PO BID PTA.  Last dose on 3/14 @ 0600.   Baseline HL (3/14 @ 2226) = 0.9, elevated from Eliquis CrCl = 74 ml/min   Goal of Therapy:  Heparin level 0.3-0.7 units/ml aPTT 66 - 102 seconds Monitor platelets by anticoagulation protocol: Yes  Date/Time: aPTT/HL: Comment/Rate: 3/15@0836  102/>1.1 Therapeutic@1500  units/hr 3/15@1603  116/--  SUPRA-therapeutic @ 1500 units/hr 3/16@0036   80/--   Therapeutic @ 1300 units/hr 3/16@0842  63/0.89 Subtherapeutic w/ levels not correlating 3/16@1815  73/--  Therapeutic @1500  units/hr 3/17@0207      87/0.77            Therapeutic X 2 @ 1500 / hr 3/17 2243 97 / --   Therapeutic x 3 3/18 0524 104 / 0.57 HL therapeutic x 1/ aPTT slightly supratherapeutic  Plan:  - Continue heparin drip rate at 1500 units/hr - Will recheck HL/aPTT with morning labs - Will continue aPTT dosing until both levels correlate, then will transition to HL dosing - Continue to monitor H&H and platelets - CBC daily  Bettey Costa, PharmD Clinical Pharmacist 11/22/2023 1:17 PM

## 2023-11-22 NOTE — Progress Notes (Signed)
 Departed for [facility] via ems post report to [employer]

## 2023-11-23 LAB — CULTURE, BLOOD (ROUTINE X 2)
Culture: NO GROWTH
Culture: NO GROWTH

## 2023-11-23 LAB — LIPOPROTEIN A (LPA): Lipoprotein (a): <8.4 nmol/L (ref <75.0)

## 2023-11-24 LAB — BASIC METABOLIC PANEL: Sodium: 121 mmol/L — ABNORMAL LOW (ref 60–145)

## 2023-12-05 ENCOUNTER — Inpatient Hospital Stay
Admission: EM | Admit: 2023-12-05 | Discharge: 2023-12-08 | DRG: 813 | Disposition: A | Discharge status: Home / Self Care | Attending: Internal Medicine | Admitting: Internal Medicine

## 2023-12-05 ENCOUNTER — Other Ambulatory Visit: Payer: Self-pay

## 2023-12-05 DIAGNOSIS — M7989 Other specified soft tissue disorders: Secondary | ICD-10-CM

## 2023-12-05 DIAGNOSIS — Z9842 Cataract extraction status, left eye: Secondary | ICD-10-CM

## 2023-12-05 DIAGNOSIS — I350 Nonrheumatic aortic (valve) stenosis: Secondary | ICD-10-CM | POA: Diagnosis present

## 2023-12-05 DIAGNOSIS — R319 Hematuria, unspecified: Secondary | ICD-10-CM | POA: Diagnosis present

## 2023-12-05 DIAGNOSIS — I110 Hypertensive heart disease with heart failure: Secondary | ICD-10-CM | POA: Diagnosis present

## 2023-12-05 DIAGNOSIS — Z7982 Long term (current) use of aspirin: Secondary | ICD-10-CM

## 2023-12-05 DIAGNOSIS — R338 Other retention of urine: Secondary | ICD-10-CM | POA: Diagnosis present

## 2023-12-05 DIAGNOSIS — R310 Gross hematuria: Secondary | ICD-10-CM | POA: Diagnosis present

## 2023-12-05 DIAGNOSIS — D6832 Hemorrhagic disorder due to extrinsic circulating anticoagulants: Principal | ICD-10-CM | POA: Diagnosis present

## 2023-12-05 DIAGNOSIS — R58 Hemorrhage, not elsewhere classified: Secondary | ICD-10-CM

## 2023-12-05 DIAGNOSIS — N401 Enlarged prostate with lower urinary tract symptoms: Secondary | ICD-10-CM | POA: Diagnosis present

## 2023-12-05 DIAGNOSIS — I2510 Atherosclerotic heart disease of native coronary artery without angina pectoris: Secondary | ICD-10-CM | POA: Diagnosis present

## 2023-12-05 DIAGNOSIS — J449 Chronic obstructive pulmonary disease, unspecified: Secondary | ICD-10-CM | POA: Diagnosis present

## 2023-12-05 DIAGNOSIS — W19XXXA Unspecified fall, initial encounter: Secondary | ICD-10-CM

## 2023-12-05 DIAGNOSIS — R34 Anuria and oliguria: Secondary | ICD-10-CM

## 2023-12-05 DIAGNOSIS — I214 Non-ST elevation (NSTEMI) myocardial infarction: Secondary | ICD-10-CM | POA: Diagnosis present

## 2023-12-05 DIAGNOSIS — I5022 Chronic systolic (congestive) heart failure: Secondary | ICD-10-CM | POA: Diagnosis present

## 2023-12-05 DIAGNOSIS — Z961 Presence of intraocular lens: Secondary | ICD-10-CM | POA: Diagnosis present

## 2023-12-05 DIAGNOSIS — E785 Hyperlipidemia, unspecified: Secondary | ICD-10-CM | POA: Diagnosis present

## 2023-12-05 DIAGNOSIS — Z79899 Other long term (current) drug therapy: Secondary | ICD-10-CM

## 2023-12-05 DIAGNOSIS — Z794 Long term (current) use of insulin: Secondary | ICD-10-CM

## 2023-12-05 DIAGNOSIS — I480 Paroxysmal atrial fibrillation: Secondary | ICD-10-CM | POA: Diagnosis present

## 2023-12-05 DIAGNOSIS — D62 Acute posthemorrhagic anemia: Secondary | ICD-10-CM | POA: Diagnosis present

## 2023-12-05 DIAGNOSIS — Z8673 Personal history of transient ischemic attack (TIA), and cerebral infarction without residual deficits: Secondary | ICD-10-CM

## 2023-12-05 DIAGNOSIS — Z87891 Personal history of nicotine dependence: Secondary | ICD-10-CM

## 2023-12-05 DIAGNOSIS — Z9049 Acquired absence of other specified parts of digestive tract: Secondary | ICD-10-CM

## 2023-12-05 DIAGNOSIS — Z8505 Personal history of malignant neoplasm of liver: Secondary | ICD-10-CM

## 2023-12-05 DIAGNOSIS — Z7984 Long term (current) use of oral hypoglycemic drugs: Secondary | ICD-10-CM

## 2023-12-05 DIAGNOSIS — Z9861 Coronary angioplasty status: Secondary | ICD-10-CM

## 2023-12-05 DIAGNOSIS — E119 Type 2 diabetes mellitus without complications: Secondary | ICD-10-CM | POA: Diagnosis present

## 2023-12-05 DIAGNOSIS — R55 Syncope and collapse: Principal | ICD-10-CM

## 2023-12-05 DIAGNOSIS — Z9841 Cataract extraction status, right eye: Secondary | ICD-10-CM

## 2023-12-05 DIAGNOSIS — Z7901 Long term (current) use of anticoagulants: Secondary | ICD-10-CM

## 2023-12-05 DIAGNOSIS — I951 Orthostatic hypotension: Secondary | ICD-10-CM | POA: Diagnosis present

## 2023-12-05 DIAGNOSIS — Z833 Family history of diabetes mellitus: Secondary | ICD-10-CM

## 2023-12-05 LAB — CBC
HCT: 24.5 % — ABNORMAL LOW (ref 39.0–52.0)
Hemoglobin: 7.7 g/dL — ABNORMAL LOW (ref 13.0–17.0)
MCH: 32.1 pg (ref 26.0–34.0)
MCHC: 31.4 g/dL (ref 30.0–36.0)
MCV: 102.1 fL — ABNORMAL HIGH (ref 80.0–100.0)
Platelets: 260 10*3/uL (ref 150–400)
RBC: 2.4 MIL/uL — ABNORMAL LOW (ref 4.22–5.81)
RDW: 21.1 % — ABNORMAL HIGH (ref 11.5–15.5)
WBC: 7.7 10*3/uL (ref 4.0–10.5)
nRBC: 0 % (ref 0.0–0.2)

## 2023-12-05 LAB — BASIC METABOLIC PANEL WITH GFR
Anion gap: 7 (ref 5–15)
BUN: 35 mg/dL — ABNORMAL HIGH (ref 8–23)
CO2: 24 mmol/L (ref 22–32)
Calcium: 8 mg/dL — ABNORMAL LOW (ref 8.9–10.3)
Chloride: 104 mmol/L (ref 98–111)
Creatinine, Ser: 0.95 mg/dL (ref 0.61–1.24)
GFR, Estimated: >60 mL/min (ref >60)
Glucose, Bld: 198 mg/dL — ABNORMAL HIGH (ref 70–99)
Potassium: 4.8 mmol/L (ref 3.5–5.1)
Sodium: 135 mmol/L (ref 135–145)

## 2023-12-05 LAB — HEPATIC FUNCTION PANEL
ALT: 19 U/L (ref 0–44)
AST: 28 U/L (ref 15–41)
Albumin: 2.9 g/dL — ABNORMAL LOW (ref 3.5–5.0)
Alkaline Phosphatase: 95 U/L (ref 38–126)
Bilirubin, Direct: 0.1 mg/dL (ref 0.0–0.2)
Indirect Bilirubin: 1 mg/dL — ABNORMAL HIGH (ref 0.3–0.9)
Total Bilirubin: 1.1 mg/dL (ref 0.0–1.2)
Total Protein: 6.2 g/dL — ABNORMAL LOW (ref 6.5–8.1)

## 2023-12-05 LAB — PROTIME-INR
INR: 1.3 — ABNORMAL HIGH (ref 0.8–1.2)
Prothrombin Time: 16.7 s — ABNORMAL HIGH (ref 11.4–15.2)

## 2023-12-05 LAB — TROPONIN I (HIGH SENSITIVITY): Troponin I (High Sensitivity): 116 ng/L (ref <18)

## 2023-12-05 MED ORDER — LACTATED RINGERS IV BOLUS
1000.0000 mL | Freq: Once | INTRAVENOUS | Status: AC
  Administered 2023-12-05: 1000 mL via INTRAVENOUS

## 2023-12-05 NOTE — ED Provider Notes (Signed)
 [name] Provider Note    Event Date/Time   First MD Initiated Contact with Patient 12/05/23 2051     (approximate)   History   Urinary Retention   HPI  Mathew Baker is a 83 y.o. male history history of diabetes, hypertension, hyperlipidemia, history of COPD, prior history of CVA, HCC, A-fib on Eliquis here for decreased urine output from his Foley.  Per daughter patient had a fall a couple days ago, tugged on his Foley cath but it did not completely fall out.  Patient thinks he did not hit his head, was trying to get into bed when he lost his balance and fell.  Denies having any pain after the fall.  No cough, chest pain, pain to his extremities, back, abdomen.  Patient noticed some hematuria in his Foley bag today.  Denies bleeding anywhere else.  Per EMS patient had a syncopal episode that lasted for couple seconds, when he stood up, became very short of breath and passed out.  Blood glucose was 233 for them.  Per daughter he has had no fevers at home.  Has had decreased urine output today.  States that when he tugged on his Foley cath during the fall couple days ago, it did not completely fall out.  Independent issue obtained from EMS and daughter as above.  On independent chart review, he was admitted in mid March for NSTEMI, transferred to [facility] for further management.  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  Symptoms resolve with sitting and when his extremities are elevated.  Also had urinary retention when his bladder was not emptying, was discharged with a Foley.     Physical Exam   Triage Vital Signs: ED Triage Vitals  Encounter Vitals Group     BP 12/05/23 1935 (!) 98/54     Systolic BP Percentile --      Diastolic BP Percentile --      Pulse Rate 12/05/23 1935 (!) 154     Resp 12/05/23 1935 18     Temp 12/05/23 1935 97.9 F (36.6 C)     Temp Source 12/05/23  1935 Oral     SpO2 12/05/23 1935 96 %     Weight 12/05/23 1935 201 lb (91.2 kg)     Height 12/05/23 1935 6\' 1"  (1.854 m)     Head Circumference --      Peak Flow --      Pain Score 12/05/23 1947 0     Pain Loc --      Pain Education --      Exclude from Growth Chart --     Most recent vital signs: Vitals:   12/05/23 2130 12/05/23 2230  BP: 129/66 131/65  Pulse: 70 73  Resp:  14  Temp:    SpO2: 100% 100%     General: Awake, no distress.  CV:  Good peripheral perfusion.   Resp:  Normal effort.  Clear Abd:  No distention.  Abdomen soft, there is some ecchymoses to his suprapubic region as well as his lower abdomen and anterior left hip.  The Foley is in place with gross hematuria Other:  Bilateral lower extremity edema, right lower extremity is greater than the left.  No palpable skull deformities or tenderness, no obvious weakness or numbness.  No thoracic cage tenderness or midline spinal tenderness.  No bony tenderness to his extremities.   ED Results /  Procedures / Treatments   Labs (all labs ordered are listed, but only abnormal results are displayed) Labs Reviewed  BASIC METABOLIC PANEL WITH GFR - Abnormal; Notable for the following components:      Result Value   Glucose, Bld 198 (*)    BUN 35 (*)    Calcium 8.0 (*)    All other components within normal limits  CBC - Abnormal; Notable for the following components:   RBC 2.40 (*)    Hemoglobin 7.7 (*)    HCT 24.5 (*)    MCV 102.1 (*)    RDW 21.1 (*)    All other components within normal limits  HEPATIC FUNCTION PANEL - Abnormal; Notable for the following components:   Total Protein 6.2 (*)    Albumin 2.9 (*)    Indirect Bilirubin 1.0 (*)    All other components within normal limits  PROTIME-INR - Abnormal; Notable for the following components:   Prothrombin Time 16.7 (*)    INR 1.3 (*)    All other components within normal limits  TROPONIN I (HIGH SENSITIVITY) - Abnormal; Notable for the following  components:   Troponin I (High Sensitivity) 116 (*)    All other components within normal limits  URINALYSIS, W/ REFLEX TO CULTURE (INFECTION SUSPECTED)  BRAIN NATRIURETIC PEPTIDE  TROPONIN I (HIGH SENSITIVITY)     EKG  EKG showed atrial fibrillation, rate 69, widened QRS, prolonged QTc, intraventricular conduction delay, no ischemic ST elevation, T wave flattening in 1, aVL, aVF, V4, V5, T wave changes new compared to prior    PROCEDURES:  Critical Care performed: No  Procedures   MEDICATIONS ORDERED IN ED: Medications  lactated ringers bolus 1,000 mL (1,000 mLs Intravenous New Bag/Given 12/05/23 2231)     IMPRESSION / MDM / ASSESSMENT AND PLAN / ED COURSE  I reviewed the triage vital signs and the nursing notes.                              Differential diagnosis includes, but is not limited to, for his syncopal episode, with shortness of breath, considered PE, pneumonia, ACS, arrhythmia, orthostasis, UTI, electrolyte derangements, anemia.  For his lower abdominal ecchymoses, consider traumatic injury from his fall versus possibly from his history of PCI, get a CT abdomen pelvis to assess.  For decreased urination and hematuria, consider retention, trauma from when his Foley was pulled, UTI, medication side effect, renal failure. will get a CT head as well as cervical spine.  Chest x-ray, DVT ultrasound, labs, EKG, troponin, UA.  Bedside ultrasound.  Patient's presentation is most consistent with acute presentation with potential threat to life or bodily function.  Bedside ultrasound was done, bladder is not distended, is collapsed over Foley balloon.  Independent review of labs and imaging are below.  Patient signed out to oncoming team pending imaging results, he will likely need to be admitted for further management.  Clinical Course as of 12/05/23 2340  Mon Dec 05, 2023  2340 Independent review of labs, electrolytes all severely deranged, creatinine is normal, troponin  is elevated but this is downtrending compared to prior, hemoglobin is 7.7 which is down from 2 weeks ago. [TT]    Clinical Course User Index [TT] Claybon Jabs, MD     FINAL CLINICAL IMPRESSION(S) / ED DIAGNOSES   Final diagnoses:  Syncope, unspecified syncope type  Fall, initial encounter  Hematuria, unspecified type  Decreased urine output  Leg  swelling  Ecchymosis     Rx / DC Orders   ED Discharge Orders     None        Note:  This document was prepared using Dragon voice recognition software and may include unintentional dictation errors.    Claybon Jabs, MD 12/05/23 [phone]

## 2023-12-05 NOTE — ED Triage Notes (Signed)
 Pt from home via [facility] with reports that pt has been having blood in urine bag that was noticed today, when ems stood pt up he had a syncopal episode that lasted a couple seconds. Pt is on blood thinner.  140/70 100RA 66HR 233 CBG

## 2023-12-06 ENCOUNTER — Emergency Department

## 2023-12-06 DIAGNOSIS — R55 Syncope and collapse: Secondary | ICD-10-CM

## 2023-12-06 DIAGNOSIS — R319 Hematuria, unspecified: Secondary | ICD-10-CM | POA: Diagnosis not present

## 2023-12-06 DIAGNOSIS — R310 Gross hematuria: Secondary | ICD-10-CM

## 2023-12-06 LAB — URINALYSIS, W/ REFLEX TO CULTURE (INFECTION SUSPECTED)
Bacteria, UA: NONE SEEN
RBC / HPF: >50 RBC/hpf (ref 0–5)
Squamous Epithelial / HPF: 0 /HPF (ref 0–5)

## 2023-12-06 LAB — BRAIN NATRIURETIC PEPTIDE: B Natriuretic Peptide: 911.2 pg/mL — ABNORMAL HIGH (ref 0.0–100.0)

## 2023-12-06 LAB — CBC
HCT: 22.2 % — ABNORMAL LOW (ref 39.0–52.0)
HCT: 23.8 % — ABNORMAL LOW (ref 39.0–52.0)
Hemoglobin: 7 g/dL — ABNORMAL LOW (ref 13.0–17.0)
Hemoglobin: 7.7 g/dL — ABNORMAL LOW (ref 13.0–17.0)
MCH: 31.5 pg (ref 26.0–34.0)
MCH: 32 pg (ref 26.0–34.0)
MCHC: 31.5 g/dL (ref 30.0–36.0)
MCHC: 32.4 g/dL (ref 30.0–36.0)
MCV: 100 fL (ref 80.0–100.0)
MCV: 98.8 fL (ref 80.0–100.0)
Platelets: 240 10*3/uL (ref 150–400)
Platelets: 228 10*3/uL (ref 150–400)
RBC: 2.22 MIL/uL — ABNORMAL LOW (ref 4.22–5.81)
RBC: 2.41 MIL/uL — ABNORMAL LOW (ref 4.22–5.81)
RDW: 21.4 % — ABNORMAL HIGH (ref 11.5–15.5)
RDW: 22.4 % — ABNORMAL HIGH (ref 11.5–15.5)
WBC: 5.6 10*3/uL (ref 4.0–10.5)
WBC: 5.7 10*3/uL (ref 4.0–10.5)
nRBC: 0 % (ref 0.0–0.2)
nRBC: 0.4 % — ABNORMAL HIGH (ref 0.0–0.2)

## 2023-12-06 LAB — GLUCOSE, CAPILLARY
Glucose-Capillary: 100 mg/dL — ABNORMAL HIGH (ref 70–99)
Glucose-Capillary: 77 mg/dL (ref 70–99)
Glucose-Capillary: 115 mg/dL — ABNORMAL HIGH (ref 70–99)

## 2023-12-06 LAB — TROPONIN I (HIGH SENSITIVITY): Troponin I (High Sensitivity): 109 ng/L (ref <18)

## 2023-12-06 LAB — CBG MONITORING, ED
Glucose-Capillary: 173 mg/dL — ABNORMAL HIGH (ref 70–99)
Glucose-Capillary: 167 mg/dL — ABNORMAL HIGH (ref 70–99)
Glucose-Capillary: 126 mg/dL — ABNORMAL HIGH (ref 70–99)
Glucose-Capillary: 101 mg/dL — ABNORMAL HIGH (ref 70–99)

## 2023-12-06 LAB — PREPARE RBC (CROSSMATCH)

## 2023-12-06 LAB — ABO/RH: ABO/RH(D): B POS

## 2023-12-06 MED ORDER — ASPIRIN 81 MG PO TBEC
81.0000 mg | DELAYED_RELEASE_TABLET | Freq: Every day | ORAL | Status: DC
  Administered 2023-12-06 – 2023-12-08 (×3): 81 mg via ORAL
  Filled 2023-12-06 (×3): qty 1

## 2023-12-06 MED ORDER — MORPHINE SULFATE (PF) 2 MG/ML IV SOLN: 2.0000 mg | INTRAVENOUS | Status: DC | PRN

## 2023-12-06 MED ORDER — HYDROCODONE-ACETAMINOPHEN 5-325 MG PO TABS: 1.0000 | ORAL_TABLET | Freq: Three times a day (TID) | ORAL | Status: DC | PRN

## 2023-12-06 MED ORDER — ONDANSETRON HCL 4 MG PO TABS: 4.0000 mg | ORAL_TABLET | Freq: Four times a day (QID) | ORAL | Status: DC | PRN

## 2023-12-06 MED ORDER — ATORVASTATIN CALCIUM 80 MG PO TABS
80.0000 mg | ORAL_TABLET | Freq: Every day | ORAL | Status: DC
  Administered 2023-12-06 – 2023-12-08 (×3): 80 mg via ORAL
  Filled 2023-12-06: qty 1
  Filled 2023-12-06: qty 4
  Filled 2023-12-06: qty 1

## 2023-12-06 MED ORDER — ONDANSETRON HCL 4 MG/2ML IJ SOLN: 4.0000 mg | Freq: Four times a day (QID) | INTRAMUSCULAR | Status: DC | PRN

## 2023-12-06 MED ORDER — SODIUM CHLORIDE 0.9 % IR SOLN
3000.0000 mL | Status: DC
  Administered 2023-12-06: 3000 mL

## 2023-12-06 MED ORDER — PANTOPRAZOLE SODIUM 40 MG PO TBEC
40.0000 mg | DELAYED_RELEASE_TABLET | Freq: Every day | ORAL | Status: DC
  Administered 2023-12-06 – 2023-12-08 (×3): 40 mg via ORAL
  Filled 2023-12-06 (×3): qty 1

## 2023-12-06 MED ORDER — ACETAMINOPHEN 650 MG RE SUPP
650.0000 mg | Freq: Four times a day (QID) | RECTAL | Status: DC | PRN
Start: 2023-12-06 — End: 2023-12-08

## 2023-12-06 MED ORDER — ACETAMINOPHEN 325 MG PO TABS: 650.0000 mg | ORAL_TABLET | Freq: Four times a day (QID) | ORAL | Status: DC | PRN

## 2023-12-06 MED ORDER — INSULIN ASPART 100 UNIT/ML IJ SOLN
0.0000 [IU] | INTRAMUSCULAR | Status: DC
  Administered 2023-12-06: 3 [IU] via SUBCUTANEOUS
  Administered 2023-12-06: 2 [IU] via SUBCUTANEOUS
  Administered 2023-12-06 – 2023-12-07 (×2): 3 [IU] via SUBCUTANEOUS
  Administered 2023-12-07: 2 [IU] via SUBCUTANEOUS
  Administered 2023-12-07: 3 [IU] via SUBCUTANEOUS
  Administered 2023-12-07: 2 [IU] via SUBCUTANEOUS
  Administered 2023-12-07 (×2): 3 [IU] via SUBCUTANEOUS
  Administered 2023-12-08: 2 [IU] via SUBCUTANEOUS
  Filled 2023-12-06 (×10): qty 1

## 2023-12-06 MED ORDER — SODIUM CHLORIDE 0.9% IV SOLUTION
Freq: Once | INTRAVENOUS | Status: AC
  Filled 2023-12-06: qty 250

## 2023-12-06 MED ORDER — LIDOCAINE HCL URETHRAL/MUCOSAL 2 % EX GEL
1.0000 | Freq: Once | CUTANEOUS | Status: AC
  Administered 2023-12-06: 1 via URETHRAL
  Filled 2023-12-06: qty 10

## 2023-12-06 MED ORDER — IOHEXOL 350 MG/ML SOLN
100.0000 mL | Freq: Once | INTRAVENOUS | Status: AC | PRN
  Administered 2023-12-06: 100 mL via INTRAVENOUS

## 2023-12-06 MED ORDER — CLOPIDOGREL BISULFATE 75 MG PO TABS
75.0000 mg | ORAL_TABLET | Freq: Every day | ORAL | Status: DC
  Administered 2023-12-06 – 2023-12-08 (×3): 75 mg via ORAL
  Filled 2023-12-06 (×3): qty 1

## 2023-12-06 NOTE — ED Notes (Signed)
 TRANSPORT paged at this time to assist in pt transfer to in-patient assigned room.

## 2023-12-06 NOTE — Progress Notes (Signed)
 Brief Urology Progress Note  I revisited the patient at midday.  His CBI has run dry, unclear when, and efflux is light pink without clots.  He has no acute concerns.  Will continue to monitor off CBI and revisit at the end of my clinic today.  If his efflux remains light pink or clear, we will keep him off CBI overnight and plan for inpatient voiding trial tomorrow.  They are in agreement with this plan.  Notably, they have cancelled follow up at [facility] and prefer to establish care with our practice for local urologic care.  Carman Ching, PA-C 12/06/23  1:02 PM

## 2023-12-06 NOTE — Progress Notes (Signed)
 Brief Urology Progress Note  Urine is yellow. Ok to stop CBI. Will plan for voiding trial tomorrow with Foley pull around 7am and follow up post void bladder scan at least 4 hours later or sooner if unable to void and having pain. Orders in.  Carman Ching, PA-C 12/06/23  5:31 PM

## 2023-12-06 NOTE — H&P (Signed)
 History and Physical    Patient: Mathew Baker UJW:[id number] DOB: 1940/09/13 DOA: 12/05/2023 DOS: the patient was seen and examined on 12/06/2023 PCP: Lauro Regulus, MD  Patient coming from: Home  Chief Complaint:  Chief Complaint  Patient presents with   Urinary Retention    HPI: Clemon Roan Baker is a 83 y.o. male with medical history significant for COPD, T2DM, CVA, hyperlipidemia, hypertension, cholecystitis s/p cholecystectomy and hepatocellular carcinoma transferred from [facility] to [facility] on 3/18 for multivessel disease undergoing staged PCI, with stay complicated by orthostatic hypotension with recommendation for consideration of midodrine, as well as urinary retention, discharged on Foley, was brought into the ED with complaints of blood in the urine bag and decreased output from Foley.  On arrival of EMS, he had a brief syncopal episode.  He denies chest pain or shortness of breath ED course and data review: Hypotensive on arrival to 98/54, improving to 130/78 with IV fluids Labs notable forTroponin 116 down from over 24,000 when he had his MI BNP 911 Hemoglobin 7.7 down from 9.8 a couple weeks prior, INR 1.3 EKG with A-fib at 60 CTA PE chest negative for PE showing small bilateral pleural effusions CT abdomen and pelvis nonacute CT head and C-spine nontraumatic bilateral lower extremity venous ultrasound nonacute patient treated with IV fluid bolus Hospitalist consulted for admission.     Past Medical History:  Diagnosis Date   Chronic airway obstruction (HCC)    Degeneration of intervertebral disc of lumbar region    Diabetes mellitus without complication (HCC)    Essential hypertension, benign    Hyperlipidemia    Hypertension    Osteoarthritis of lower extremity    Pneumonia, organism unspecified(486)    PSA elevation 07/16/2013   Normal   Stroke [facility])    Past Surgical History:  Procedure Laterality Date   CATARACT EXTRACTION W/ INTRAOCULAR LENS   IMPLANT, BILATERAL     COLONOSCOPY  2006   Normal: Repeat in 10 yrs   IR PERC CHOLECYSTOSTOMY  05/20/2023   IR RADIOLOGIST EVAL & MGMT  06/22/2023   LEFT HEART CATH N/A 11/21/2023   Procedure: Left Heart Cath;  Surgeon: Alwyn Pea, MD;  Location: [facility] INVASIVE CV LAB;  Service: Cardiovascular;  Laterality: N/A;  Monday 3/17   LOOP RECORDER INSERTION N/A 02/21/2020   Procedure: LOOP RECORDER INSERTION;  Surgeon: Marcina Millard, MD;  Location: [facility] INVASIVE CV LAB;  Service: Cardiovascular;  Laterality: N/A;   LOOP RECORDER REMOVAL N/A 10/23/2020   Procedure: LOOP RECORDER REMOVAL;  Surgeon: Marcina Millard, MD;  Location: [facility] INVASIVE CV LAB;  Service: Cardiovascular;  Laterality: N/A;   TEE WITHOUT CARDIOVERSION N/A 09/15/2020   Procedure: TRANSESOPHAGEAL ECHOCARDIOGRAM (TEE);  Surgeon: Dalia Heading, MD;  Location: [facility] ORS;  Service: Cardiovascular;  Laterality: N/A;   Social History:  reports that he quit smoking about 65 years ago. His smoking use included cigarettes. He has never used smokeless tobacco. He reports that he does not drink alcohol and does not use drugs.  No Known Allergies  Family History  Problem Relation Age of Onset   Diabetes Mother     Prior to Admission medications   Medication Sig Start Date End Date Taking? Authorizing Provider  Alcohol Swabs (ALCOHOL WIPES) 70 % PADS USE FOR INSULIN INJECTIONS OR TO FINGER STICK AS DIRECTED BY YOUR MEDICAL PROVIDER 04/28/20   [provider]  aspirin EC 81 MG tablet Take 1 tablet (81 mg total) by mouth daily. Swallow whole. 11/22/23  Alford Highland, MD  atorvastatin (LIPITOR) 80 MG tablet Take 80 mg by mouth daily.    [provider]  azithromycin (ZITHROMAX) 250 MG tablet One tab po nightly for three doses 11/21/23   Alford Highland, MD  carvedilol (COREG) 3.125 MG tablet Take 1 tablet (3.125 mg total) by mouth 2 (two) times daily with a meal. 11/21/23   Renae Gloss, Richard, MD  docusate  sodium (COLACE) 100 MG capsule Take 100 mg by mouth 2 (two) times daily. 05/02/23   [provider]  empagliflozin (JARDIANCE) 25 MG TABS tablet 1 tablet 1 day or 1 dose. 12/09/20   [provider]  feeding supplement (ENSURE ENLIVE / ENSURE PLUS) LIQD Take 237 mLs by mouth 3 (three) times daily between meals. 05/24/23   Alford Highland, MD  furosemide (LASIX) 10 MG/ML injection Inject 4 mLs (40 mg total) into the vein 2 (two) times daily. 11/21/23   Alford Highland, MD  glucose blood (PRECISION QID TEST) test strip Use to test blood sugar 2 times daily as instructed. Dx: E11.65 08/22/15   [provider]  heparin 11914 UT/250ML infusion Inject 1,500 Units/hr into the vein continuous. 11/21/23   Alford Highland, MD  HYDROcodone-acetaminophen (NORCO/VICODIN) 5-325 MG tablet Take 1 tablet by mouth every 8 (eight) hours as needed. 02/26/21   [provider]  insulin aspart (NOVOLOG) 100 UNIT/ML injection Inject 0-6 Units into the skin 3 (three) times daily as needed for high blood sugar. 05/24/23   Alford Highland, MD  insulin glargine (LANTUS) 100 UNIT/ML injection Inject 0.1 mLs (10 Units total) into the skin daily. 11/21/23   Alford Highland, MD  Insulin Pen Needle (PEN NEEDLES 29GX1/2") 29G X MISC Use 1x a day 08/21/14   [provider]  ipratropium-albuterol (DUONEB) 0.5-2.5 (3) MG/3ML SOLN Take 3 mLs by nebulization every 4 (four) hours. 11/21/23   Wieting, Richard, MD  latanoprost (XALATAN) 0.005 % ophthalmic solution Place 1 drop into both eyes at bedtime.    [provider]  ondansetron (ZOFRAN) 4 MG/2ML SOLN injection Inject 2 mLs (4 mg total) into the vein every 6 (six) hours as needed for nausea or vomiting. 11/21/23   Alford Highland, MD  pantoprazole (PROTONIX) 40 MG tablet Take 1 tablet (40 mg total) by mouth daily. 05/25/23   Alford Highland, MD  polyethylene glycol (MIRALAX / GLYCOLAX) 17 g packet Take 17 g by mouth daily as needed for  moderate constipation. 11/21/23   Alford Highland, MD  tamsulosin (FLOMAX) 0.4 MG CAPS capsule Take 0.4 mg by mouth at bedtime.    [provider]  vitamin B-12 (CYANOCOBALAMIN) 100 MCG tablet Take 100 mcg by mouth daily.    [provider]    Physical Exam: Vitals:   12/05/23 2130 12/05/23 2230 12/06/23 0030 12/06/23 0345  BP: 129/66 131/65 123/79 130/78  Pulse: 70 73 65 70  Resp:  14 17 17   Temp:    98 F (36.7 C)  TempSrc:    Oral  SpO2: 100% 100% 100% 100%  Weight:      Height:       Physical Exam Vitals and nursing note reviewed.  Constitutional:      General: He is not in acute distress. HENT:     Head: Normocephalic and atraumatic.  Cardiovascular:     Rate and Rhythm: Normal rate and regular rhythm.     Heart sounds: Normal heart sounds.  Pulmonary:     Effort: Pulmonary effort is normal.  Breath sounds: Normal breath sounds.  Abdominal:     Palpations: Abdomen is soft.     Tenderness: There is no abdominal tenderness.  Genitourinary:    Comments: Foley with red urine with clots Neurological:     Mental Status: Mental status is at baseline.     Labs on Admission: I have personally reviewed following labs and imaging studies  CBC: Recent Labs  Lab 12/05/23 1951  WBC 7.7  HGB 7.7*  HCT 24.5*  MCV 102.1*  PLT 260   Basic Metabolic Panel: Recent Labs  Lab 12/05/23 1951  NA 135  K 4.8  CL 104  CO2 24  GLUCOSE 198*  BUN 35*  CREATININE 0.95  CALCIUM 8.0*   GFR: Estimated Creatinine Clearance: 66.6 mL/min (by C-G formula based on SCr of 0.95 mg/dL). Liver Function Tests: Recent Labs  Lab 12/05/23 1951  AST 28  ALT 19  ALKPHOS 95  BILITOT 1.1  PROT 6.2*  ALBUMIN 2.9*   No results for input(s): "LIPASE", "AMYLASE" in the last 168 hours. No results for input(s): "AMMONIA" in the last 168 hours. Coagulation Profile: Recent Labs  Lab 12/05/23 2202  INR 1.3*   Cardiac Enzymes: No results for input(s): "CKTOTAL",  "CKMB", "CKMBINDEX", "TROPONINI" in the last 168 hours. BNP (last 3 results) No results for input(s): "PROBNP" in the last 8760 hours. HbA1C: No results for input(s): "HGBA1C" in the last 72 hours. CBG: No results for input(s): "GLUCAP" in the last 168 hours. Lipid Profile: No results for input(s): "CHOL", "HDL", "LDLCALC", "TRIG", "CHOLHDL", "LDLDIRECT" in the last 72 hours. Thyroid Function Tests: No results for input(s): "TSH", "T4TOTAL", "FREET4", "T3FREE", "THYROIDAB" in the last 72 hours. Anemia Panel: No results for input(s): "VITAMINB12", "FOLATE", "FERRITIN", "TIBC", "IRON", "RETICCTPCT" in the last 72 hours. Urine analysis:    Component Value Date/Time   COLORURINE YELLOW (A) 11/21/2023 0659   APPEARANCEUR CLEAR (A) 11/21/2023 0659   LABSPEC 1.021 11/21/2023 0659   PHURINE 5.0 11/21/2023 0659   GLUCOSEU NEGATIVE 11/21/2023 0659   HGBUR NEGATIVE 11/21/2023 0659   BILIRUBINUR NEGATIVE 11/21/2023 0659   KETONESUR NEGATIVE 11/21/2023 0659   PROTEINUR NEGATIVE 11/21/2023 0659   NITRITE NEGATIVE 11/21/2023 0659   LEUKOCYTESUR NEGATIVE 11/21/2023 0659    Radiological Exams on Admission: US Venous Img Lower Bilateral Result Date: 12/06/2023 CLINICAL DATA:  Lower extremity swelling EXAM: BILATERAL LOWER EXTREMITY VENOUS DOPPLER ULTRASOUND TECHNIQUE: Gray-scale sonography with compression, as well as color and duplex ultrasound, were performed to evaluate the deep venous system(s) from the level of the common femoral vein through the popliteal and proximal calf veins. COMPARISON:  None Available. FINDINGS: VENOUS Normal compressibility of the common femoral, superficial femoral, and popliteal veins, as well as the visualized calf veins. Visualized portions of profunda femoral vein and great saphenous vein unremarkable. No filling defects to suggest DVT on grayscale or color Doppler imaging. Doppler waveforms show normal direction of venous flow, normal respiratory plasticity and  response to augmentation. Limited views of the contralateral common femoral vein are unremarkable. OTHER None. Limitations: none IMPRESSION: Negative. Electronically Signed   By: Deatra Robinson M.D.   On: 12/06/2023 02:45   CT Angio Chest PE W/Cm &/Or Wo Cm Result Date: 12/06/2023 CLINICAL DATA:  Fall, blunt chest trauma, lower abdominal ecchymosis, hematuria EXAM: CT ANGIOGRAPHY CHEST CT ABDOMEN AND PELVIS WITH CONTRAST TECHNIQUE: Multidetector CT imaging of the chest was performed using the standard protocol during bolus administration of intravenous contrast. Multiplanar CT image reconstructions and MIPs were obtained to  evaluate the vascular anatomy. Multidetector CT imaging of the abdomen and pelvis was performed using the standard protocol during bolus administration of intravenous contrast. RADIATION DOSE REDUCTION: This exam was performed according to the departmental dose-optimization program which includes automated exposure control, adjustment of the mA and/or kV according to patient size and/or use of iterative reconstruction technique. CONTRAST:  OMNIPAQUE IOHEXOL 350 MG/ML SOLN COMPARISON:  11/18/2023 FINDINGS: CTA CHEST FINDINGS Cardiovascular: There is adequate opacification of the pulmonary arterial tree. No intraluminal filling defect identified to suggest acute pulmonary embolism. The central pulmonary arteries are of normal caliber. There is extensive multi-vessel coronary artery calcification. Advanced calcification of the aortic valve leaflets. Global cardiac size is within normal limits. No pericardial effusion. Moderate atherosclerotic calcification within the thoracic aorta. No aortic aneurysm. Mediastinum/Nodes: No enlarged mediastinal, hilar, or axillary lymph nodes. Thyroid gland, trachea, and esophagus demonstrate no significant findings. Lungs/Pleura: Moderate right and small left pleural effusions are again identified, slightly enlarged on the right with compressive atelectasis  of the lungs dependently. There is progressive subtotal collapse of the right lower lobe. No pneumothorax. No central obstructing lesion Musculoskeletal: Osseous structures are age appropriate. No acute bone abnormality. No lytic or blastic bone lesion. Review of the MIP images confirms the above findings. CT ABDOMEN and PELVIS FINDINGS Hepatobiliary: 25 mm hypodense lesions seen within left hepatic dome, similar to prior examination, but indeterminate. The liver is otherwise unremarkable. No intra or extrahepatic biliary ductal dilation. Status post cholecystectomy. Pancreas: Unremarkable Spleen: Unremarkable Adrenals/Urinary Tract: Adrenal glands are unremarkable. The kidneys are normal in size and position. Mild right renal cortical scarring. Stable moderate bilateral nonspecific perinephric edema. No hydronephrosis. No intrarenal or ureteral calculi. The bladder is decompressed with a Foley catheter balloon seen within its lumen. Stomach/Bowel: Mild sigmoid diverticulosis. Stomach, small bowel, and large bowel are otherwise unremarkable. Appendix normal. No evidence of obstruction or focal inflammation. No free intraperitoneal gas or fluid. Vascular/Lymphatic: Extensive aortoiliac atherosclerotic calcification with particularly atherosclerotic calcification at the origin of the mesenteric and renal arterial vasculature no pathologic adenopathy within the abdomen and pelvis. Reproductive: More prostatic hypertrophy. Other: Moderate diffuse subcutaneous body wall edema. No abdominal hernia. Musculoskeletal: No acute bone abnormality. No lytic or blastic lesion. Review of the MIP images confirms the above findings. IMPRESSION: 1. No pulmonary embolism. No acute intrathoracic or intra-abdominal pathology identified. 2. Moderate right and small left pleural effusions, slightly enlarged on the right with progressive subtotal collapse of the right lower lobe. 3. 25 mm hypodense lesion within the left hepatic dome,  similar to prior examination, but indeterminate. This could be better assessed with hepatic protocol MRI examination when clinically appropriate. 4. Mild sigmoid diverticulosis. 5. Moderate diffuse subcutaneous body wall edema. Aortic Atherosclerosis (ICD10-I70.0). Electronically Signed   By: Helyn Numbers M.D.   On: 12/06/2023 01:36   CT ABDOMEN PELVIS W CONTRAST Result Date: 12/06/2023 CLINICAL DATA:  Fall, blunt chest trauma, lower abdominal ecchymosis, hematuria EXAM: CT ANGIOGRAPHY CHEST CT ABDOMEN AND PELVIS WITH CONTRAST TECHNIQUE: Multidetector CT imaging of the chest was performed using the standard protocol during bolus administration of intravenous contrast. Multiplanar CT image reconstructions and MIPs were obtained to evaluate the vascular anatomy. Multidetector CT imaging of the abdomen and pelvis was performed using the standard protocol during bolus administration of intravenous contrast. RADIATION DOSE REDUCTION: This exam was performed according to the departmental dose-optimization program which includes automated exposure control, adjustment of the mA and/or kV according to patient size and/or use of iterative reconstruction technique.  CONTRAST:  OMNIPAQUE IOHEXOL 350 MG/ML SOLN COMPARISON:  11/18/2023 FINDINGS: CTA CHEST FINDINGS Cardiovascular: There is adequate opacification of the pulmonary arterial tree. No intraluminal filling defect identified to suggest acute pulmonary embolism. The central pulmonary arteries are of normal caliber. There is extensive multi-vessel coronary artery calcification. Advanced calcification of the aortic valve leaflets. Global cardiac size is within normal limits. No pericardial effusion. Moderate atherosclerotic calcification within the thoracic aorta. No aortic aneurysm. Mediastinum/Nodes: No enlarged mediastinal, hilar, or axillary lymph nodes. Thyroid gland, trachea, and esophagus demonstrate no significant findings. Lungs/Pleura: Moderate right and  small left pleural effusions are again identified, slightly enlarged on the right with compressive atelectasis of the lungs dependently. There is progressive subtotal collapse of the right lower lobe. No pneumothorax. No central obstructing lesion Musculoskeletal: Osseous structures are age appropriate. No acute bone abnormality. No lytic or blastic bone lesion. Review of the MIP images confirms the above findings. CT ABDOMEN and PELVIS FINDINGS Hepatobiliary: 25 mm hypodense lesions seen within left hepatic dome, similar to prior examination, but indeterminate. The liver is otherwise unremarkable. No intra or extrahepatic biliary ductal dilation. Status post cholecystectomy. Pancreas: Unremarkable Spleen: Unremarkable Adrenals/Urinary Tract: Adrenal glands are unremarkable. The kidneys are normal in size and position. Mild right renal cortical scarring. Stable moderate bilateral nonspecific perinephric edema. No hydronephrosis. No intrarenal or ureteral calculi. The bladder is decompressed with a Foley catheter balloon seen within its lumen. Stomach/Bowel: Mild sigmoid diverticulosis. Stomach, small bowel, and large bowel are otherwise unremarkable. Appendix normal. No evidence of obstruction or focal inflammation. No free intraperitoneal gas or fluid. Vascular/Lymphatic: Extensive aortoiliac atherosclerotic calcification with particularly atherosclerotic calcification at the origin of the mesenteric and renal arterial vasculature no pathologic adenopathy within the abdomen and pelvis. Reproductive: More prostatic hypertrophy. Other: Moderate diffuse subcutaneous body wall edema. No abdominal hernia. Musculoskeletal: No acute bone abnormality. No lytic or blastic lesion. Review of the MIP images confirms the above findings. IMPRESSION: 1. No pulmonary embolism. No acute intrathoracic or intra-abdominal pathology identified. 2. Moderate right and small left pleural effusions, slightly enlarged on the right with  progressive subtotal collapse of the right lower lobe. 3. 25 mm hypodense lesion within the left hepatic dome, similar to prior examination, but indeterminate. This could be better assessed with hepatic protocol MRI examination when clinically appropriate. 4. Mild sigmoid diverticulosis. 5. Moderate diffuse subcutaneous body wall edema. Aortic Atherosclerosis (ICD10-I70.0). Electronically Signed   By: Helyn Numbers M.D.   On: 12/06/2023 01:36   CT Head Wo Contrast Result Date: 12/06/2023 CLINICAL DATA:  Syncopal episode with head trauma. On blood thinner. Neck trauma. EXAM: CT HEAD WITHOUT CONTRAST CT CERVICAL SPINE WITHOUT CONTRAST TECHNIQUE: Multidetector CT imaging of the head and cervical spine was performed following the standard protocol without intravenous contrast. Multiplanar CT image reconstructions of the cervical spine were also generated. RADIATION DOSE REDUCTION: This exam was performed according to the departmental dose-optimization program which includes automated exposure control, adjustment of the mA and/or kV according to patient size and/or use of iterative reconstruction technique. COMPARISON:  CT head and cervical spine 09/30/2022 FINDINGS: CT HEAD FINDINGS Brain: No intracranial hemorrhage, mass effect, or evidence of acute infarct. No hydrocephalus. No extra-axial fluid collection. Age related cerebral atrophy and chronic small vessel ischemic disease. Chronic bilateral cerebellar infarcts. Chronic lacunar infarct in the right subinsular cortex. Vascular: No hyperdense vessel. Intracranial arterial calcification. Skull: No fracture or focal lesion. Sinuses/Orbits: No acute finding. Other: None. CT CERVICAL SPINE FINDINGS Alignment: No evidence of traumatic  malalignment. Leftward rotation of the axis around the dens is favored positional. Skull base and vertebrae: No acute fracture. No primary bone lesion or focal pathologic process. Soft tissues and spinal canal: No prevertebral fluid or  swelling. No visible canal hematoma. Disc levels: Multilevel advanced spondylosis with bulky bridging anterior osteophytes, disc space height loss and degenerative endplate changes greatest at C5-C6. Multilevel mild to moderate effacement of the ventral thecal sac due to posterior disc osteophyte complexes greatest at C5-C6 and C6-C7. No severe spinal canal narrowing. Multilevel facet arthropathy. Partially Upper chest: Partially visualized large bilateral pleural effusions. Other: Carotid calcification. IMPRESSION: 1. No acute intracranial abnormality. 2. No acute fracture in the cervical spine. 3. Partially visualized large bilateral pleural effusions. Electronically Signed   By: Minerva Fester M.D.   On: 12/06/2023 01:32   CT Cervical Spine Wo Contrast Result Date: 12/06/2023 CLINICAL DATA:  Syncopal episode with head trauma. On blood thinner. Neck trauma. EXAM: CT HEAD WITHOUT CONTRAST CT CERVICAL SPINE WITHOUT CONTRAST TECHNIQUE: Multidetector CT imaging of the head and cervical spine was performed following the standard protocol without intravenous contrast. Multiplanar CT image reconstructions of the cervical spine were also generated. RADIATION DOSE REDUCTION: This exam was performed according to the departmental dose-optimization program which includes automated exposure control, adjustment of the mA and/or kV according to patient size and/or use of iterative reconstruction technique. COMPARISON:  CT head and cervical spine 09/30/2022 FINDINGS: CT HEAD FINDINGS Brain: No intracranial hemorrhage, mass effect, or evidence of acute infarct. No hydrocephalus. No extra-axial fluid collection. Age related cerebral atrophy and chronic small vessel ischemic disease. Chronic bilateral cerebellar infarcts. Chronic lacunar infarct in the right subinsular cortex. Vascular: No hyperdense vessel. Intracranial arterial calcification. Skull: No fracture or focal lesion. Sinuses/Orbits: No acute finding. Other: None. CT  CERVICAL SPINE FINDINGS Alignment: No evidence of traumatic malalignment. Leftward rotation of the axis around the dens is favored positional. Skull base and vertebrae: No acute fracture. No primary bone lesion or focal pathologic process. Soft tissues and spinal canal: No prevertebral fluid or swelling. No visible canal hematoma. Disc levels: Multilevel advanced spondylosis with bulky bridging anterior osteophytes, disc space height loss and degenerative endplate changes greatest at C5-C6. Multilevel mild to moderate effacement of the ventral thecal sac due to posterior disc osteophyte complexes greatest at C5-C6 and C6-C7. No severe spinal canal narrowing. Multilevel facet arthropathy. Partially Upper chest: Partially visualized large bilateral pleural effusions. Other: Carotid calcification. IMPRESSION: 1. No acute intracranial abnormality. 2. No acute fracture in the cervical spine. 3. Partially visualized large bilateral pleural effusions. Electronically Signed   By: Minerva Fester M.D.   On: 12/06/2023 01:32     Data Reviewed: Relevant notes from primary care and specialist visits, past discharge summaries as available in EHR, including Care Everywhere. Prior diagnostic testing as pertinent to current admission diagnoses Updated medications and problem lists for reconciliation ED course, including vitals, labs, imaging, treatment and response to treatment Triage notes, nursing and pharmacy notes and ED provider's notes Notable results as noted in HPI   Assessment and Plan:   Hematuria with acute blood loss anemia, on Eliquis Foley catheter 3/26 secondary to urinary retention -Hemoglobin 7.7 down from 9.8 - Hold Eliquis.  SCDs for DVT prophylaxis - Serial H&H and transfuse if needed -Bladder irrigated in the ED -Urology consult-keep n.p.o. for possible procedure  CAD s/p staged PCI x 2 3/21/ and 11/28/2023 HTN HLD. - Continue DAPT due to recent procedure, atorvastatin  Syncope,  likely secondary to  orthostatic hypotension - Received an IV fluid bolus in the ED with improvement - Continue to monitor BP -Midodrine was being considered at [facility]  Chronic systolic HF  Severe AS  -GDMT limited due to orthostatic hypotension  -Holding Coreg and lisinopril.    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  Will continue.   Diabetes Recent A1c 8.8% Sliding scale insulin coverage   DVT prophylaxis: SCDs  Consults: Allergy  Advance Care Planning:   Code Status: Prior   Family Communication: none  Disposition Plan: Back to previous home environment  Severity of Illness: The appropriate patient status for this patient is INPATIENT. Inpatient status is judged to be reasonable and necessary in order to provide the required intensity of service to ensure the patient's safety. The patient's presenting symptoms, physical exam findings, and initial radiographic and laboratory data in the context of their chronic comorbidities is felt to place them at high risk for further clinical deterioration. Furthermore, it is not anticipated that the patient will be medically stable for discharge from the hospital within 2 midnights of admission.   * I certify that at the point of admission it is my clinical judgment that the patient will require inpatient hospital care spanning beyond 2 midnights from the point of admission due to high intensity of service, high risk for further deterioration and high frequency of surveillance required.*  Author: Andris Baumann, MD 12/06/2023 4:03 AM  For on call review [email or web address].

## 2023-12-06 NOTE — Consult Note (Signed)
 Urology Consult  I have been asked to see the patient by Dr. Para March, for evaluation and management of gross hematuria.  Chief Complaint: Syncope, gross hematuria  History of Present Illness: Mathew Baker is a 83 y.o. year old male with PMH A-fib on Eliquis, COPD, DM 2, CVA, HTN, hepatocellular carcinoma, and CAD on DAPT with recent admission at [facility] with multivessel disease undergoing staged PCI with a recent history of syncopal episodes and urinary retention requiring Foley catheter placement who presented to the ED overnight with reports of gross hematuria and decreased urinary output.  CTAP with contrast revealed an enlarged prostate and diffuse bladder wall thickening.  The bladder is decompressed around a Foley catheter.  No evidence of clot retention.  Admission labs notable for creatinine 0.95 (at baseline); hemoglobin 7.7 (9.8 2 weeks ago); and UA with >50 RBCs/hpf, 21-50 WBCs/hpf, and no bacteria.  Urine culture pending.  His Foley catheter was irrigated in the emergency department and he was started on CBI.  Eliquis is being held but he remains on DAPT in light of recent procedure.  He is accompanied today by his daughter at the bedside.  They are scheduled for what sounds like outpatient voiding trial with [facility] tomorrow, however he has never seen them before so this would be a new patient visit.  They wonder if we could remove his catheter here, as they have difficulty transporting him and question their ability to get to [facility].  He denies any abdominal pain today.  Foley catheter in place draining pink urine without obstructing clots on slow drip CBI.  Past Medical History:  Diagnosis Date   Chronic airway obstruction (HCC)    Degeneration of intervertebral disc of lumbar region    Diabetes mellitus without complication (HCC)    Essential hypertension, benign    Hyperlipidemia    Hypertension    Osteoarthritis of lower extremity    Pneumonia, organism  unspecified(486)    PSA elevation 07/16/2013   Normal   Stroke [facility])     Past Surgical History:  Procedure Laterality Date   CATARACT EXTRACTION W/ INTRAOCULAR LENS  IMPLANT, BILATERAL     COLONOSCOPY  2006   Normal: Repeat in 10 yrs   IR PERC CHOLECYSTOSTOMY  05/20/2023   IR RADIOLOGIST EVAL & MGMT  06/22/2023   LEFT HEART CATH N/A 11/21/2023   Procedure: Left Heart Cath;  Surgeon: Alwyn Pea, MD;  Location: [facility] INVASIVE CV LAB;  Service: Cardiovascular;  Laterality: N/A;  Monday 3/17   LOOP RECORDER INSERTION N/A 02/21/2020   Procedure: LOOP RECORDER INSERTION;  Surgeon: Marcina Millard, MD;  Location: [facility] INVASIVE CV LAB;  Service: Cardiovascular;  Laterality: N/A;   LOOP RECORDER REMOVAL N/A 10/23/2020   Procedure: LOOP RECORDER REMOVAL;  Surgeon: Marcina Millard, MD;  Location: [facility] INVASIVE CV LAB;  Service: Cardiovascular;  Laterality: N/A;   TEE WITHOUT CARDIOVERSION N/A 09/15/2020   Procedure: TRANSESOPHAGEAL ECHOCARDIOGRAM (TEE);  Surgeon: Dalia Heading, MD;  Location: [facility] ORS;  Service: Cardiovascular;  Laterality: N/A;    Home Medications:  No outpatient medications have been marked as taking for the 12/05/23 encounter [facility] Encounter).    Allergies: No Known Allergies  Family History  Problem Relation Age of Onset   Diabetes Mother     Social History:  reports that he quit smoking about 65 years ago. His smoking use included cigarettes. He has never used smokeless tobacco. He reports that he does not drink alcohol and does not use drugs.  ROS: A complete review of systems was performed.  All systems are negative except for pertinent findings as noted.  Physical Exam:  Vital signs in last 24 hours: Temp:  [97.9 F (36.6 C)-98.3 F (36.8 C)] 98.3 F (36.8 C) (04/01 0752) Pulse Rate:  [60-154] 60 (04/01 0827) Resp:  [11-18] 11 (04/01 0827) BP: (98-131)/(54-79) 111/63 (04/01 0827) SpO2:  [96 %-100 %] 100 % (04/01 0827) Weight:  [91.2 kg]  91.2 kg (03/31 1935) Constitutional:  Alert and oriented, no acute distress HEENT: [place] AT, moist mucus membranes Cardiovascular: No clubbing, cyanosis, or edema Respiratory: Normal respiratory effort GI: Abdomen is soft, nontender. Skin: No rashes, bruises or suspicious lesions Neurologic: Grossly intact, no focal deficits, moving all 4 extremities Psychiatric: Normal mood and affect  Laboratory Data:  Recent Labs    12/05/23 1951 12/06/23 0428  WBC 7.7 5.6  HGB 7.7* 7.0*  HCT 24.5* 22.2*   Recent Labs    12/05/23 1951  NA 135  K 4.8  CL 104  CO2 24  GLUCOSE 198*  BUN 35*  CREATININE 0.95  CALCIUM 8.0*   Recent Labs    12/05/23 2202  INR 1.3*   Urinalysis    Component Value Date/Time   COLORURINE RED (A) 12/06/2023 0428   APPEARANCEUR CLOUDY (A) 12/06/2023 0428   LABSPEC  12/06/2023 0428    TEST NOT REPORTED DUE TO COLOR INTERFERENCE OF URINE PIGMENT   PHURINE  12/06/2023 0428    TEST NOT REPORTED DUE TO COLOR INTERFERENCE OF URINE PIGMENT   GLUCOSEU (A) 12/06/2023 0428    TEST NOT REPORTED DUE TO COLOR INTERFERENCE OF URINE PIGMENT   HGBUR (A) 12/06/2023 0428    TEST NOT REPORTED DUE TO COLOR INTERFERENCE OF URINE PIGMENT   BILIRUBINUR (A) 12/06/2023 0428    TEST NOT REPORTED DUE TO COLOR INTERFERENCE OF URINE PIGMENT   KETONESUR (A) 12/06/2023 0428    TEST NOT REPORTED DUE TO COLOR INTERFERENCE OF URINE PIGMENT   PROTEINUR (A) 12/06/2023 0428    TEST NOT REPORTED DUE TO COLOR INTERFERENCE OF URINE PIGMENT   NITRITE (A) 12/06/2023 0428    TEST NOT REPORTED DUE TO COLOR INTERFERENCE OF URINE PIGMENT   LEUKOCYTESUR (A) 12/06/2023 0428    TEST NOT REPORTED DUE TO COLOR INTERFERENCE OF URINE PIGMENT   Results for orders placed or performed during the hospital encounter of 11/18/23  Resp panel by RT-PCR (RSV, Flu A&B, Covid) Anterior Nasal Swab     Status: None   Collection Time: 11/18/23  9:43 PM   Specimen: Anterior Nasal Swab  Result Value Ref Range  Status   SARS Coronavirus 2 by RT PCR NEGATIVE NEGATIVE Final    Comment: (NOTE) SARS-CoV-2 target nucleic acids are NOT DETECTED.  The SARS-CoV-2 RNA is generally detectable in upper respiratory specimens during the acute phase of infection. The lowest concentration of SARS-CoV-2 viral copies this assay can detect is 138 copies/mL. A negative result does not preclude SARS-Cov-2 infection and should not be used as the sole basis for treatment or other patient management decisions. A negative result may occur with  improper specimen collection/handling, submission of specimen other than nasopharyngeal swab, presence of viral mutation(s) within the areas targeted by this assay, and inadequate number of viral copies(<138 copies/mL). A negative result must be combined with clinical observations, patient history, and epidemiological information. The expected result is Negative.  Fact Sheet for Patients:  BloggerCourse.com  Fact Sheet for Healthcare Providers:  SeriousBroker.it  This test is no  t yet approved or cleared by the [place] and  has been authorized for detection and/or diagnosis of SARS-CoV-2 by FDA under an Emergency Use Authorization (EUA). This EUA will remain  in effect (meaning this test can be used) for the duration of the COVID-19 declaration under Section 564(b)(1) of the Act, 21 U.S.C.section 360bbb-3(b)(1), unless the authorization is terminated  or revoked sooner.       Influenza A by PCR NEGATIVE NEGATIVE Final   Influenza B by PCR NEGATIVE NEGATIVE Final    Comment: (NOTE) The Xpert Xpress SARS-CoV-2/FLU/RSV plus assay is intended as an aid in the diagnosis of influenza from Nasopharyngeal swab specimens and should not be used as a sole basis for treatment. Nasal washings and aspirates are unacceptable for Xpert Xpress SARS-CoV-2/FLU/RSV testing.  Fact Sheet for  Patients: BloggerCourse.com  Fact Sheet for Healthcare Providers: SeriousBroker.it  This test is not yet approved or cleared by the [place] FDA and has been authorized for detection and/or diagnosis of SARS-CoV-2 by FDA under an Emergency Use Authorization (EUA). This EUA will remain in effect (meaning this test can be used) for the duration of the COVID-19 declaration under Section 564(b)(1) of the Act, 21 U.S.C. section 360bbb-3(b)(1), unless the authorization is terminated or revoked.     Resp Syncytial Virus by PCR NEGATIVE NEGATIVE Final    Comment: (NOTE) Fact Sheet for Patients: BloggerCourse.com  Fact Sheet for Healthcare Providers: SeriousBroker.it  This test is not yet approved or cleared by the [place] FDA and has been authorized for detection and/or diagnosis of SARS-CoV-2 by FDA under an Emergency Use Authorization (EUA). This EUA will remain in effect (meaning this test can be used) for the duration of the COVID-19 declaration under Section 564(b)(1) of the Act, 21 U.S.C. section 360bbb-3(b)(1), unless the authorization is terminated or revoked.  Performed at [facility], [address] [place], [place] [address]   Blood Culture (routine x 2)     Status: None   Collection Time: 11/18/23 10:25 PM   Specimen: BLOOD  Result Value Ref Range Status   Specimen Description BLOOD LFA  Final   Special Requests   Final    BOTTLES DRAWN AEROBIC AND ANAEROBIC Blood Culture results may not be optimal due to an inadequate volume of blood received in culture bottles   Culture   Final    NO GROWTH 5 DAYS Performed at [facility], [address], [place], [place] [address]    Report Status 11/23/2023 FINAL  Final  Blood Culture (routine x 2)     Status: None   Collection Time: 11/18/23 10:26 PM   Specimen: BLOOD  Result Value Ref Range  Status   Specimen Description BLOOD RW  Final   Special Requests   Final    BOTTLES DRAWN AEROBIC AND ANAEROBIC Blood Culture results may not be optimal due to an inadequate volume of blood received in culture bottles   Culture   Final    NO GROWTH 5 DAYS Performed at [facility], [address], [place], [place] [address]    Report Status 11/23/2023 FINAL  Final  MRSA Next Gen by PCR, Nasal     Status: None   Collection Time: 11/19/23  2:57 AM   Specimen: Nasal Mucosa; Nasal Swab  Result Value Ref Range Status   MRSA by PCR Next Gen NOT DETECTED NOT DETECTED Final    Comment: (NOTE) The GeneXpert MRSA Assay (FDA approved for NASAL specimens only), is  one component of a comprehensive MRSA colonization surveillance program. It is not intended to diagnose MRSA infection nor to guide or monitor treatment for MRSA infections. Test performance is not FDA approved in patients less than 34 years old. Performed at [facility], [address] [place], [place] [address]   Respiratory (~20 pathogens) panel by PCR     Status: None   Collection Time: 11/19/23  3:11 PM   Specimen: Nasopharyngeal Swab; Respiratory  Result Value Ref Range Status   Adenovirus NOT DETECTED NOT DETECTED Final   Coronavirus 229E NOT DETECTED NOT DETECTED Final    Comment: (NOTE) The Coronavirus on the Respiratory Panel, DOES NOT test for the novel  Coronavirus (2019 nCoV)    Coronavirus HKU1 NOT DETECTED NOT DETECTED Final   Coronavirus NL63 NOT DETECTED NOT DETECTED Final   Coronavirus OC43 NOT DETECTED NOT DETECTED Final   Metapneumovirus NOT DETECTED NOT DETECTED Final   Rhinovirus / Enterovirus NOT DETECTED NOT DETECTED Final   Influenza A NOT DETECTED NOT DETECTED Final   Influenza B NOT DETECTED NOT DETECTED Final   Parainfluenza Virus 1 NOT DETECTED NOT DETECTED Final   Parainfluenza Virus 2 NOT DETECTED NOT DETECTED Final   Parainfluenza Virus 3 NOT DETECTED NOT DETECTED Final    Parainfluenza Virus 4 NOT DETECTED NOT DETECTED Final   Respiratory Syncytial Virus NOT DETECTED NOT DETECTED Final   Bordetella pertussis NOT DETECTED NOT DETECTED Final   Bordetella Parapertussis NOT DETECTED NOT DETECTED Final   Chlamydophila pneumoniae NOT DETECTED NOT DETECTED Final   Mycoplasma pneumoniae NOT DETECTED NOT DETECTED Final    Comment: Performed at [facility], [address] [place], [place] [address]    Radiologic Imaging: US Venous Img Lower Bilateral Result Date: 12/06/2023 CLINICAL DATA:  Lower extremity swelling EXAM: BILATERAL LOWER EXTREMITY VENOUS DOPPLER ULTRASOUND TECHNIQUE: Gray-scale sonography with compression, as well as color and duplex ultrasound, were performed to evaluate the deep venous system(s) from the level of the common femoral vein through the popliteal and proximal calf veins. COMPARISON:  None Available. FINDINGS: VENOUS Normal compressibility of the common femoral, superficial femoral, and popliteal veins, as well as the visualized calf veins. Visualized portions of profunda femoral vein and great saphenous vein unremarkable. No filling defects to suggest DVT on grayscale or color Doppler imaging. Doppler waveforms show normal direction of venous flow, normal respiratory plasticity and response to augmentation. Limited views of the contralateral common femoral vein are unremarkable. OTHER None. Limitations: none IMPRESSION: Negative. Electronically Signed   By: Deatra Robinson M.D.   On: 12/06/2023 02:45   CT Angio Chest PE W/Cm &/Or Wo Cm Result Date: 12/06/2023 CLINICAL DATA:  Fall, blunt chest trauma, lower abdominal ecchymosis, hematuria EXAM: CT ANGIOGRAPHY CHEST CT ABDOMEN AND PELVIS WITH CONTRAST TECHNIQUE: Multidetector CT imaging of the chest was performed using the standard protocol during bolus administration of intravenous contrast. Multiplanar CT image reconstructions and MIPs were obtained to evaluate the vascular anatomy. Multidetector  CT imaging of the abdomen and pelvis was performed using the standard protocol during bolus administration of intravenous contrast. RADIATION DOSE REDUCTION: This exam was performed according to the departmental dose-optimization program which includes automated exposure control, adjustment of the mA and/or kV according to patient size and/or use of iterative reconstruction technique. CONTRAST:  OMNIPAQUE IOHEXOL 350 MG/ML SOLN COMPARISON:  11/18/2023 FINDINGS: CTA CHEST FINDINGS Cardiovascular: There is adequate opacification of the pulmonary arterial tree. No intraluminal filling defect identified to suggest acute pulmonary embolism.  The central pulmonary arteries are of normal caliber. There is extensive multi-vessel coronary artery calcification. Advanced calcification of the aortic valve leaflets. Global cardiac size is within normal limits. No pericardial effusion. Moderate atherosclerotic calcification within the thoracic aorta. No aortic aneurysm. Mediastinum/Nodes: No enlarged mediastinal, hilar, or axillary lymph nodes. Thyroid gland, trachea, and esophagus demonstrate no significant findings. Lungs/Pleura: Moderate right and small left pleural effusions are again identified, slightly enlarged on the right with compressive atelectasis of the lungs dependently. There is progressive subtotal collapse of the right lower lobe. No pneumothorax. No central obstructing lesion Musculoskeletal: Osseous structures are age appropriate. No acute bone abnormality. No lytic or blastic bone lesion. Review of the MIP images confirms the above findings. CT ABDOMEN and PELVIS FINDINGS Hepatobiliary: 25 mm hypodense lesions seen within left hepatic dome, similar to prior examination, but indeterminate. The liver is otherwise unremarkable. No intra or extrahepatic biliary ductal dilation. Status post cholecystectomy. Pancreas: Unremarkable Spleen: Unremarkable Adrenals/Urinary Tract: Adrenal glands are unremarkable. The  kidneys are normal in size and position. Mild right renal cortical scarring. Stable moderate bilateral nonspecific perinephric edema. No hydronephrosis. No intrarenal or ureteral calculi. The bladder is decompressed with a Foley catheter balloon seen within its lumen. Stomach/Bowel: Mild sigmoid diverticulosis. Stomach, small bowel, and large bowel are otherwise unremarkable. Appendix normal. No evidence of obstruction or focal inflammation. No free intraperitoneal gas or fluid. Vascular/Lymphatic: Extensive aortoiliac atherosclerotic calcification with particularly atherosclerotic calcification at the origin of the mesenteric and renal arterial vasculature no pathologic adenopathy within the abdomen and pelvis. Reproductive: More prostatic hypertrophy. Other: Moderate diffuse subcutaneous body wall edema. No abdominal hernia. Musculoskeletal: No acute bone abnormality. No lytic or blastic lesion. Review of the MIP images confirms the above findings. IMPRESSION: 1. No pulmonary embolism. No acute intrathoracic or intra-abdominal pathology identified. 2. Moderate right and small left pleural effusions, slightly enlarged on the right with progressive subtotal collapse of the right lower lobe. 3. 25 mm hypodense lesion within the left hepatic dome, similar to prior examination, but indeterminate. This could be better assessed with hepatic protocol MRI examination when clinically appropriate. 4. Mild sigmoid diverticulosis. 5. Moderate diffuse subcutaneous body wall edema. Aortic Atherosclerosis (ICD10-I70.0). Electronically Signed   By: Helyn Numbers M.D.   On: 12/06/2023 01:36   CT ABDOMEN PELVIS W CONTRAST Result Date: 12/06/2023 CLINICAL DATA:  Fall, blunt chest trauma, lower abdominal ecchymosis, hematuria EXAM: CT ANGIOGRAPHY CHEST CT ABDOMEN AND PELVIS WITH CONTRAST TECHNIQUE: Multidetector CT imaging of the chest was performed using the standard protocol during bolus administration of intravenous contrast.  Multiplanar CT image reconstructions and MIPs were obtained to evaluate the vascular anatomy. Multidetector CT imaging of the abdomen and pelvis was performed using the standard protocol during bolus administration of intravenous contrast. RADIATION DOSE REDUCTION: This exam was performed according to the departmental dose-optimization program which includes automated exposure control, adjustment of the mA and/or kV according to patient size and/or use of iterative reconstruction technique. CONTRAST:  OMNIPAQUE IOHEXOL 350 MG/ML SOLN COMPARISON:  11/18/2023 FINDINGS: CTA CHEST FINDINGS Cardiovascular: There is adequate opacification of the pulmonary arterial tree. No intraluminal filling defect identified to suggest acute pulmonary embolism. The central pulmonary arteries are of normal caliber. There is extensive multi-vessel coronary artery calcification. Advanced calcification of the aortic valve leaflets. Global cardiac size is within normal limits. No pericardial effusion. Moderate atherosclerotic calcification within the thoracic aorta. No aortic aneurysm. Mediastinum/Nodes: No enlarged mediastinal, hilar, or axillary lymph nodes. Thyroid gland, trachea, and esophagus demonstrate no  significant findings. Lungs/Pleura: Moderate right and small left pleural effusions are again identified, slightly enlarged on the right with compressive atelectasis of the lungs dependently. There is progressive subtotal collapse of the right lower lobe. No pneumothorax. No central obstructing lesion Musculoskeletal: Osseous structures are age appropriate. No acute bone abnormality. No lytic or blastic bone lesion. Review of the MIP images confirms the above findings. CT ABDOMEN and PELVIS FINDINGS Hepatobiliary: 25 mm hypodense lesions seen within left hepatic dome, similar to prior examination, but indeterminate. The liver is otherwise unremarkable. No intra or extrahepatic biliary ductal dilation. Status post  cholecystectomy. Pancreas: Unremarkable Spleen: Unremarkable Adrenals/Urinary Tract: Adrenal glands are unremarkable. The kidneys are normal in size and position. Mild right renal cortical scarring. Stable moderate bilateral nonspecific perinephric edema. No hydronephrosis. No intrarenal or ureteral calculi. The bladder is decompressed with a Foley catheter balloon seen within its lumen. Stomach/Bowel: Mild sigmoid diverticulosis. Stomach, small bowel, and large bowel are otherwise unremarkable. Appendix normal. No evidence of obstruction or focal inflammation. No free intraperitoneal gas or fluid. Vascular/Lymphatic: Extensive aortoiliac atherosclerotic calcification with particularly atherosclerotic calcification at the origin of the mesenteric and renal arterial vasculature no pathologic adenopathy within the abdomen and pelvis. Reproductive: More prostatic hypertrophy. Other: Moderate diffuse subcutaneous body wall edema. No abdominal hernia. Musculoskeletal: No acute bone abnormality. No lytic or blastic lesion. Review of the MIP images confirms the above findings. IMPRESSION: 1. No pulmonary embolism. No acute intrathoracic or intra-abdominal pathology identified. 2. Moderate right and small left pleural effusions, slightly enlarged on the right with progressive subtotal collapse of the right lower lobe. 3. 25 mm hypodense lesion within the left hepatic dome, similar to prior examination, but indeterminate. This could be better assessed with hepatic protocol MRI examination when clinically appropriate. 4. Mild sigmoid diverticulosis. 5. Moderate diffuse subcutaneous body wall edema. Aortic Atherosclerosis (ICD10-I70.0). Electronically Signed   By: Helyn Numbers M.D.   On: 12/06/2023 01:36   CT Head Wo Contrast Result Date: 12/06/2023 CLINICAL DATA:  Syncopal episode with head trauma. On blood thinner. Neck trauma. EXAM: CT HEAD WITHOUT CONTRAST CT CERVICAL SPINE WITHOUT CONTRAST TECHNIQUE: Multidetector CT  imaging of the head and cervical spine was performed following the standard protocol without intravenous contrast. Multiplanar CT image reconstructions of the cervical spine were also generated. RADIATION DOSE REDUCTION: This exam was performed according to the departmental dose-optimization program which includes automated exposure control, adjustment of the mA and/or kV according to patient size and/or use of iterative reconstruction technique. COMPARISON:  CT head and cervical spine 09/30/2022 FINDINGS: CT HEAD FINDINGS Brain: No intracranial hemorrhage, mass effect, or evidence of acute infarct. No hydrocephalus. No extra-axial fluid collection. Age related cerebral atrophy and chronic small vessel ischemic disease. Chronic bilateral cerebellar infarcts. Chronic lacunar infarct in the right subinsular cortex. Vascular: No hyperdense vessel. Intracranial arterial calcification. Skull: No fracture or focal lesion. Sinuses/Orbits: No acute finding. Other: None. CT CERVICAL SPINE FINDINGS Alignment: No evidence of traumatic malalignment. Leftward rotation of the axis around the dens is favored positional. Skull base and vertebrae: No acute fracture. No primary bone lesion or focal pathologic process. Soft tissues and spinal canal: No prevertebral fluid or swelling. No visible canal hematoma. Disc levels: Multilevel advanced spondylosis with bulky bridging anterior osteophytes, disc space height loss and degenerative endplate changes greatest at C5-C6. Multilevel mild to moderate effacement of the ventral thecal sac due to posterior disc osteophyte complexes greatest at C5-C6 and C6-C7. No severe spinal canal narrowing. Multilevel facet arthropathy. Partially Upper  chest: Partially visualized large bilateral pleural effusions. Other: Carotid calcification. IMPRESSION: 1. No acute intracranial abnormality. 2. No acute fracture in the cervical spine. 3. Partially visualized large bilateral pleural effusions.  Electronically Signed   By: Minerva Fester M.D.   On: 12/06/2023 01:32   CT Cervical Spine Wo Contrast Result Date: 12/06/2023 CLINICAL DATA:  Syncopal episode with head trauma. On blood thinner. Neck trauma. EXAM: CT HEAD WITHOUT CONTRAST CT CERVICAL SPINE WITHOUT CONTRAST TECHNIQUE: Multidetector CT imaging of the head and cervical spine was performed following the standard protocol without intravenous contrast. Multiplanar CT image reconstructions of the cervical spine were also generated. RADIATION DOSE REDUCTION: This exam was performed according to the departmental dose-optimization program which includes automated exposure control, adjustment of the mA and/or kV according to patient size and/or use of iterative reconstruction technique. COMPARISON:  CT head and cervical spine 09/30/2022 FINDINGS: CT HEAD FINDINGS Brain: No intracranial hemorrhage, mass effect, or evidence of acute infarct. No hydrocephalus. No extra-axial fluid collection. Age related cerebral atrophy and chronic small vessel ischemic disease. Chronic bilateral cerebellar infarcts. Chronic lacunar infarct in the right subinsular cortex. Vascular: No hyperdense vessel. Intracranial arterial calcification. Skull: No fracture or focal lesion. Sinuses/Orbits: No acute finding. Other: None. CT CERVICAL SPINE FINDINGS Alignment: No evidence of traumatic malalignment. Leftward rotation of the axis around the dens is favored positional. Skull base and vertebrae: No acute fracture. No primary bone lesion or focal pathologic process. Soft tissues and spinal canal: No prevertebral fluid or swelling. No visible canal hematoma. Disc levels: Multilevel advanced spondylosis with bulky bridging anterior osteophytes, disc space height loss and degenerative endplate changes greatest at C5-C6. Multilevel mild to moderate effacement of the ventral thecal sac due to posterior disc osteophyte complexes greatest at C5-C6 and C6-C7. No severe spinal canal  narrowing. Multilevel facet arthropathy. Partially Upper chest: Partially visualized large bilateral pleural effusions. Other: Carotid calcification. IMPRESSION: 1. No acute intracranial abnormality. 2. No acute fracture in the cervical spine. 3. Partially visualized large bilateral pleural effusions. Electronically Signed   By: Minerva Fester M.D.   On: 12/06/2023 01:32   Assessment & Plan:  83 year old comorbid male with a recent history of urinary retention in the setting of multivessel CAD undergoing PCI at [facility] on DAPT, who developed gross hematuria yesterday.  Hemoglobin has dropped significantly since we last saw him 2 weeks ago, however his hematuria upon my visit this morning does not appear to be clinically significant.  No evidence of clot retention on imaging.  No plans for surgical intervention at this time.  Will plan to continue CBI.  Continue to hold Eliquis, would prefer to hold DAPT but understand this may be infeasible in light of recent PCI.  Would recommend starting finasteride to reduce his risk for prostatic bleeding.  We will continue to monitor.  We discussed that if his gross hematuria clears appropriately, we will be able to pursue inpatient voiding trial.  They are in agreement with this plan.  Recommendations: -Okay for diet, no plans for urologic procedure today -Continue CBI and titrate drip to keep efflux pink or clearer in color -Manually irrigate Foley catheter as needed to keep it draining -Hold Eliquis to allow the urine to clear, DAPT per primary team/cardiology (prefer to hold if possible) -Start finasteride  Thank you for involving me in this patient's care, I will continue to follow along.  Carman Ching, PA-C 12/06/2023 10:30 AM

## 2023-12-06 NOTE — Progress Notes (Signed)
 Progress Note   Patient: Mathew Baker ZOX:[id number] DOB: [birth year] DOA: 12/05/2023     0 DOS: the patient was seen and examined on 12/06/2023   Brief hospital course: Mathew Baker is a 83 y.o. male with medical history significant for COPD, T2DM, CVA, hyperlipidemia, hypertension, cholecystitis s/p cholecystectomy and hepatocellular carcinoma transferred from [facility] to [facility] on 3/18 for multivessel disease undergoing staged PCI, with stay complicated by orthostatic hypotension with recommendation for consideration of midodrine, as well as urinary retention, discharged on Foley, was brought into the ED with complaints of blood in the urine bag and decreased output from Foley.  On arrival of EMS, he had a brief syncopal episode.   CTA PE chest negative for PE showing small bilateral pleural effusions CT abdomen and pelvis nonacute CT head and C-spine nontraumatic bilateral lower extremity venous ultrasound nonacute patient treated with IV fluid bolus. Hospitalist consulted for admission.     Assessment and Plan:   Hematuria with acute blood loss anemia, on Eliquis Foley catheter 3/26 secondary to urinary retention We will maintain Hb greater than 8 in the setting of CHF Continue to hold Eliquis.  SCDs for DVT prophylaxis Continue CBC Plan of care discussed with urologist and currently no surgical intervention planned He has agreed to be transfused a unit of blood Hematuria clearing up with continuous bladder irrigation in place I have also discussed with urologist who is on board   CAD s/p staged PCI x 2 3/21/ and 11/28/2023 HTN HLD. Continue DAPT due to recent procedure, atorvastatin   Syncope, likely secondary to orthostatic hypotension - Received an IV fluid bolus in the ED with improvement - Continue to monitor BP -Midodrine was being considered at [facility]  Chronic systolic HF  Severe AS  -GDMT limited due to orthostatic hypotension  -Holding Coreg and lisinopril.     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  Will continue.   Diabetes Recent A1c 8.8% Sliding scale insulin coverage    DVT prophylaxis: SCDs   Consults: Allergy   Advance Care Planning:   Code Status: Prior    Family Communication: Discussed with patient's daughter at bedside  Disposition Plan: Back to previous home environment    Subjective:  Patient seen and examined at bedside this morning in the presence of the daughter According to him his mental status is improving He denies chest pain nausea vomiting or abdominal pain He has agreed to be transfused a unit of blood Hematuria clearing up with continuous bladder irrigation in place I have also discussed with urologist who is on board  Physical Exam:  Vitals and nursing note reviewed.  Constitutional:      General: He is not in acute distress. HENT:     Head: Normocephalic and atraumatic.  Cardiovascular:     Rate and Rhythm: Normal rate and regular rhythm.     Heart sounds: Normal heart sounds.  Pulmonary:     Effort: Pulmonary effort is normal.     Breath sounds: Normal breath sounds.  Abdominal:     Palpations: Abdomen is soft.     Tenderness: There is no abdominal tenderness.  Genitourinary:    Comments: Foley with red urine with clots Neurological:     Mental Status: Mental status is at baseline.     Vitals:   12/06/23 0827 12/06/23 1201 12/06/23 1202 12/06/23 1225  BP: 111/63  (!) 102/59 110/61  Pulse: 60  65 65  Resp: 11  15 18   Temp:  97.9  F (36.6 C) 97.9 F (36.6 C) 98.2 F (36.8 C)  TempSrc:  Oral  Oral  SpO2: 100%  100%   Weight:      Height:        Data Reviewed:  Have reviewed patient's CT scan report    Latest Ref Rng & Units 12/06/2023    4:28 AM 12/05/2023    7:51 PM 11/22/2023    5:24 AM  CBC  WBC 4.0 - 10.5 K/uL 5.6  7.7  10.7   Hemoglobin 13.0 - 17.0 g/dL 7.0  7.7  9.8   Hematocrit 39.0 - 52.0 % 22.2  24.5  31.7   Platelets 150 - 400 K/uL 240  260  214         Latest Ref Rng & Units 12/05/2023    7:51 PM 11/22/2023    5:24 AM 11/21/2023    4:16 AM  BMP  Glucose 70 - 99 mg/dL 865  784  696   BUN 8 - 23 mg/dL 35  52  51   Creatinine 0.61 - 1.24 mg/dL 2.95  2.84  1.32   Sodium 135 - 145 mmol/L 135  138  139   Potassium 3.5 - 5.1 mmol/L 4.8  4.5  4.7   Chloride 98 - 111 mmol/L 104  103  104   CO2 22 - 32 mmol/L 24  30  27    Calcium 8.9 - 10.3 mg/dL 8.0  8.7  8.1      Family Communication: Discussed with patient's family at bedside  Disposition: Pending PT OT eval and medical stabilization  Time spent: 54 minutes  Author: Loyce Dys, MD 12/06/2023 3:27 PM  For on call review [email or web address].

## 2023-12-06 NOTE — ED Provider Notes (Signed)
-----------------------------------------   12:59 AM on 12/06/2023 -----------------------------------------   Nurse tried to troubleshoot Foley catheter, able to inject saline but not withdraw.  Gross hematuria noted with clots.  Discussed with patient and daughter; will place three-way stopcock and perform CBI.  Patient to go to CT.  Anticipate hospitalization.  ----------------------------------------- 1:55 AM on 12/06/2023 -----------------------------------------   Imaging studies negative for acute process.  Will consult hospitalist services for evaluation and admission.   Irean Hong, MD 12/06/23 [phone]

## 2023-12-07 DIAGNOSIS — N401 Enlarged prostate with lower urinary tract symptoms: Secondary | ICD-10-CM | POA: Diagnosis present

## 2023-12-07 DIAGNOSIS — I5022 Chronic systolic (congestive) heart failure: Secondary | ICD-10-CM | POA: Diagnosis present

## 2023-12-07 DIAGNOSIS — D6832 Hemorrhagic disorder due to extrinsic circulating anticoagulants: Secondary | ICD-10-CM | POA: Diagnosis present

## 2023-12-07 DIAGNOSIS — R338 Other retention of urine: Secondary | ICD-10-CM | POA: Diagnosis present

## 2023-12-07 DIAGNOSIS — J449 Chronic obstructive pulmonary disease, unspecified: Secondary | ICD-10-CM | POA: Diagnosis present

## 2023-12-07 DIAGNOSIS — I214 Non-ST elevation (NSTEMI) myocardial infarction: Secondary | ICD-10-CM | POA: Diagnosis present

## 2023-12-07 DIAGNOSIS — Z7982 Long term (current) use of aspirin: Secondary | ICD-10-CM | POA: Diagnosis not present

## 2023-12-07 DIAGNOSIS — D62 Acute posthemorrhagic anemia: Secondary | ICD-10-CM | POA: Diagnosis present

## 2023-12-07 DIAGNOSIS — R310 Gross hematuria: Secondary | ICD-10-CM | POA: Diagnosis present

## 2023-12-07 DIAGNOSIS — Z8673 Personal history of transient ischemic attack (TIA), and cerebral infarction without residual deficits: Secondary | ICD-10-CM | POA: Diagnosis not present

## 2023-12-07 DIAGNOSIS — I2510 Atherosclerotic heart disease of native coronary artery without angina pectoris: Secondary | ICD-10-CM | POA: Diagnosis present

## 2023-12-07 DIAGNOSIS — I951 Orthostatic hypotension: Secondary | ICD-10-CM

## 2023-12-07 DIAGNOSIS — R58 Hemorrhage, not elsewhere classified: Secondary | ICD-10-CM | POA: Diagnosis present

## 2023-12-07 DIAGNOSIS — Z794 Long term (current) use of insulin: Secondary | ICD-10-CM | POA: Diagnosis not present

## 2023-12-07 DIAGNOSIS — I350 Nonrheumatic aortic (valve) stenosis: Secondary | ICD-10-CM | POA: Diagnosis present

## 2023-12-07 DIAGNOSIS — Z7901 Long term (current) use of anticoagulants: Secondary | ICD-10-CM | POA: Diagnosis not present

## 2023-12-07 DIAGNOSIS — Z9049 Acquired absence of other specified parts of digestive tract: Secondary | ICD-10-CM | POA: Diagnosis not present

## 2023-12-07 DIAGNOSIS — Z833 Family history of diabetes mellitus: Secondary | ICD-10-CM | POA: Diagnosis not present

## 2023-12-07 DIAGNOSIS — Z7984 Long term (current) use of oral hypoglycemic drugs: Secondary | ICD-10-CM | POA: Diagnosis not present

## 2023-12-07 DIAGNOSIS — Z87891 Personal history of nicotine dependence: Secondary | ICD-10-CM | POA: Diagnosis not present

## 2023-12-07 DIAGNOSIS — Z9861 Coronary angioplasty status: Secondary | ICD-10-CM | POA: Diagnosis not present

## 2023-12-07 DIAGNOSIS — I110 Hypertensive heart disease with heart failure: Secondary | ICD-10-CM | POA: Diagnosis present

## 2023-12-07 DIAGNOSIS — E785 Hyperlipidemia, unspecified: Secondary | ICD-10-CM | POA: Diagnosis present

## 2023-12-07 DIAGNOSIS — E119 Type 2 diabetes mellitus without complications: Secondary | ICD-10-CM | POA: Diagnosis present

## 2023-12-07 DIAGNOSIS — I480 Paroxysmal atrial fibrillation: Secondary | ICD-10-CM | POA: Diagnosis present

## 2023-12-07 LAB — BASIC METABOLIC PANEL WITH GFR
Anion gap: 7 (ref 5–15)
BUN: 35 mg/dL — ABNORMAL HIGH (ref 8–23)
CO2: 24 mmol/L (ref 22–32)
Calcium: 7.5 mg/dL — ABNORMAL LOW (ref 8.9–10.3)
Chloride: 101 mmol/L (ref 98–111)
Creatinine, Ser: 1 mg/dL (ref 0.61–1.24)
GFR, Estimated: >60 mL/min (ref >60)
Glucose, Bld: 198 mg/dL — ABNORMAL HIGH (ref 70–99)
Potassium: 4.6 mmol/L (ref 3.5–5.1)
Sodium: 132 mmol/L — ABNORMAL LOW (ref 135–145)

## 2023-12-07 LAB — CBC WITH DIFFERENTIAL/PLATELET
Abs Immature Granulocytes: 0.03 10*3/uL (ref 0.00–0.07)
Basophils Absolute: 0.1 10*3/uL (ref 0.0–0.1)
Basophils Relative: 1 %
Eosinophils Absolute: 0.1 10*3/uL (ref 0.0–0.5)
Eosinophils Relative: 2 %
HCT: 23.7 % — ABNORMAL LOW (ref 39.0–52.0)
Hemoglobin: 7.6 g/dL — ABNORMAL LOW (ref 13.0–17.0)
Immature Granulocytes: 1 %
Lymphocytes Relative: 21 %
Lymphs Abs: 1.4 10*3/uL (ref 0.7–4.0)
MCH: 31 pg (ref 26.0–34.0)
MCHC: 32.1 g/dL (ref 30.0–36.0)
MCV: 96.7 fL (ref 80.0–100.0)
Monocytes Absolute: 0.9 10*3/uL (ref 0.1–1.0)
Monocytes Relative: 13 %
Neutro Abs: 4.2 10*3/uL (ref 1.7–7.7)
Neutrophils Relative %: 62 %
Platelets: 229 10*3/uL (ref 150–400)
RBC: 2.45 MIL/uL — ABNORMAL LOW (ref 4.22–5.81)
RDW: 22.5 % — ABNORMAL HIGH (ref 11.5–15.5)
WBC: 6.6 10*3/uL (ref 4.0–10.5)
nRBC: 0 % (ref 0.0–0.2)

## 2023-12-07 LAB — GLUCOSE, CAPILLARY
Glucose-Capillary: 195 mg/dL — ABNORMAL HIGH (ref 70–99)
Glucose-Capillary: 144 mg/dL — ABNORMAL HIGH (ref 70–99)
Glucose-Capillary: 177 mg/dL — ABNORMAL HIGH (ref 70–99)
Glucose-Capillary: 148 mg/dL — ABNORMAL HIGH (ref 70–99)
Glucose-Capillary: 152 mg/dL — ABNORMAL HIGH (ref 70–99)
Glucose-Capillary: 127 mg/dL — ABNORMAL HIGH (ref 70–99)

## 2023-12-07 LAB — PREPARE RBC (CROSSMATCH)

## 2023-12-07 MED ORDER — SODIUM CHLORIDE 0.9% IV SOLUTION: Freq: Once | INTRAVENOUS | Status: AC

## 2023-12-07 NOTE — Evaluation (Signed)
 Physical Therapy Evaluation Patient Details Name: Izeah Vossler MRN: [id number] DOB: 07/01/1941 Today's Date: 12/07/2023  History of Present Illness  Mathew Baker is a 83 y.o. male with medical history significant for COPD, T2DM, CVA, hyperlipidemia, hypertension, cholecystitis s/p cholecystectomy and hepatocellular carcinoma transferred from [facility] to [facility] on 3/18 for multivessel disease undergoing staged PCI, with stay complicated by orthostatic hypotension with recommendation for consideration of midodrine, as well as urinary retention, discharged on Foley, was brought into the ED with complaints of blood in the urine bag and decreased output from Foley.  On arrival of EMS, he had a brief syncopal episode   Clinical Impression  Patient received in bed, daughter at bedside. Patient is agreeable to PT assessment. Patient is mod I with bed mobility. Transfers with cga. Cues needed for taking time if dizzy. Reports mild dizziness in sitting and throughout session. Patient ambulated 100 feet with RW and chair follow, cga. Some mild instability at times with ambulation. No overt lob. Patient required 1 seated rest during ambulation. He will continue to benefit from skilled PT to improve strength and safety with mobility.           If plan is discharge home, recommend the following: A little help with walking and/or transfers;A little help with bathing/dressing/bathroom;Assist for transportation   Can travel by private vehicle    yes    Equipment Recommendations None recommended by PT  Recommendations for Other Services       Functional Status Assessment Patient has had a recent decline in their functional status and demonstrates the ability to make significant improvements in function in a reasonable and predictable amount of time.     Precautions / Restrictions Precautions Precautions: Fall Recall of Precautions/Restrictions: Intact Restrictions Weight Bearing Restrictions Per  Provider Order: No      Mobility  Bed Mobility Overal bed mobility: Modified Independent                  Transfers Overall transfer level: Needs assistance Equipment used: Rolling walker (2 wheels) Transfers: Sit to/from Stand Sit to Stand: Contact guard assist           General transfer comment: Cues neeed for safe hand placement during transfers    Ambulation/Gait Ambulation/Gait assistance: Contact guard assist Gait Distance (Feet): 100 Feet Assistive device: Rolling walker (2 wheels) Gait Pattern/deviations: Step-through pattern, Decreased step length - right, Decreased step length - left, Decreased stride length Gait velocity: decr     General Gait Details: patient ambulated out to nursing desk and back to room. Cga for safety. occasional weakness of R LE noted with walking and patient reports dizziness with mobility. Chair follow for safety. He required 1 seated rest break during walk.  Stairs            Wheelchair Mobility     Tilt Bed    Modified Rankin (Stroke Patients Only)       Balance Overall balance assessment: Needs assistance, Modified Independent Sitting-balance support: Feet supported Sitting balance-Leahy Scale: Good     Standing balance support: Bilateral upper extremity supported, During functional activity, Reliant on assistive device for balance Standing balance-Leahy Scale: Good Standing balance comment: NO overt lob, but some unsteadiness and dizziness reported.                             Pertinent Vitals/Pain Pain Assessment Pain Assessment: No/denies pain    Home Living Family/patient expects to  be discharged to:: Private residence Living Arrangements: Spouse/significant other (daughter states wife cannot take care of him) Available Help at Discharge: Family;Available 24 hours/day Type of Home: House Home Access: Level entry       Home Layout: One level Home Equipment: Rollator (4  wheels);BSC/3in1;Electric scooter Additional Comments: Pt's daughter lives 1 mile away.    Prior Function Prior Level of Function : Independent/Modified Independent;Driving             Mobility Comments: rollator at baseline; 1 fall last week after d/c home from [facility] while pt trying to get into his bed (which was very high; has since been replaced with regular height bed). ADLs Comments: Pt tyipcally MOD (I), driving; wife does household IADLs. Pt (I) with meds.     Extremity/Trunk Assessment   Upper Extremity Assessment Upper Extremity Assessment: Defer to OT evaluation    Lower Extremity Assessment Lower Extremity Assessment: Generalized weakness    Cervical / Trunk Assessment Cervical / Trunk Assessment: Normal  Communication   Communication Communication: Impaired Factors Affecting Communication: Hearing impaired    Cognition Arousal: Alert Behavior During Therapy: WFL for tasks assessed/performed   PT - Cognitive impairments: No apparent impairments                         Following commands: Intact       Cueing Cueing Techniques: Verbal cues     General Comments General comments (skin integrity, edema, etc.): Orthostatics checked at beginning of session: Supine 101/62, sitting 114/54, standing 98/46. After feeling dizzy in standing, pt sat and BP 108/50. O2 100%. When recovering on EOB HR dropped into 30's for about 30 seconds. NT called for new telemetry battery (bradycardia noted via pulseoximeter and confirmed manually). MD notified.    Exercises     Assessment/Plan    PT Assessment    PT Problem List         PT Treatment Interventions      PT Goals (Current goals can be found in the Care Plan section)  Acute Rehab PT Goals Patient Stated Goal: return home PT Goal Formulation: With patient/family Time For Goal Achievement: 12/14/23 Potential to Achieve Goals: Good    Frequency       Co-evaluation                AM-PAC PT "6 Clicks" Mobility  Outcome Measure Help needed turning from your back to your side while in a flat bed without using bedrails?: None Help needed moving from lying on your back to sitting on the side of a flat bed without using bedrails?: None Help needed moving to and from a bed to a chair (including a wheelchair)?: A Little Help needed standing up from a chair using your arms (e.g., wheelchair or bedside chair)?: A Little Help needed to walk in hospital room?: A Little Help needed climbing 3-5 steps with a railing? : A Lot 6 Click Score: 19    End of Session Equipment Utilized During Treatment: Gait belt Activity Tolerance: Patient limited by fatigue Patient left: in chair;with call bell/phone within reach;with family/visitor present Nurse Communication: Mobility status PT Visit Diagnosis: Unsteadiness on feet (R26.81);Muscle weakness (generalized) (M62.81);Difficulty in walking, not elsewhere classified (R26.2)    Time: 1610-9604 PT Time Calculation (min) (ACUTE ONLY): 24 min   Charges:   PT Evaluation $PT Eval Moderate Complexity: 1 Mod PT Treatments $Gait Training: 8-22 mins PT General Charges $$ ACUTE PT VISIT: 1 Visit  Gissel Keilman, PT, GCS 12/07/23,2:35 PM

## 2023-12-07 NOTE — Progress Notes (Signed)
   12/07/23 1530  Spiritual Encounters  Type of Visit Initial  Care provided to: Patient  Referral source Chaplain assessment  Reason for visit Routine spiritual support  OnCall Visit Yes  Spiritual Framework  Presenting Themes Meaning/purpose/sources of inspiration;Values and beliefs;Goals in life/care;Other (comment) (Pt was in the [employer] for 30 years and loves his grandchildren very much. Pt stated that he was ok because of God.)  Interventions  Spiritual Care Interventions Made Established relationship of care and support;Compassionate presence;Reflective listening;Narrative/life review;Encouragement  Intervention Outcomes  Outcomes Connection to spiritual care;Awareness around self/spiritual resourses;Connection to values and goals of care;Awareness of support

## 2023-12-07 NOTE — Plan of Care (Signed)
  Problem: Coping: Goal: Ability to adjust to condition or change in health will improve Outcome: Progressing   Problem: Tissue Perfusion: Goal: Adequacy of tissue perfusion will improve Outcome: Progressing   Problem: Activity: Goal: Risk for activity intolerance will decrease Outcome: Progressing

## 2023-12-07 NOTE — Progress Notes (Signed)
 Progress Note   Patient: Mathew Baker ZOX:[id number] DOB: 07-03-1941 DOA: 12/05/2023     0 DOS: the patient was seen and examined on 12/07/2023     Brief hospital course: Clemon Raymel Cull is a 83 y.o. male with medical history significant for COPD, T2DM, CVA, hyperlipidemia, hypertension, cholecystitis s/p cholecystectomy and hepatocellular carcinoma transferred from [facility] to [facility] on 3/18 for multivessel disease undergoing staged PCI, with stay complicated by orthostatic hypotension with recommendation for consideration of midodrine, as well as urinary retention, discharged on Foley, was brought into the ED with complaints of blood in the urine bag and decreased output from Foley.  On arrival of EMS, he had a brief syncopal episode.   CTA PE chest negative for PE showing small bilateral pleural effusions CT abdomen and pelvis nonacute CT head and C-spine nontraumatic bilateral lower extremity venous ultrasound nonacute patient treated with IV fluid bolus. Hospitalist consulted for admission.      Assessment and Plan:    Hematuria with acute blood loss anemia, on Eliquis Foley catheter 3/26 secondary to urinary retention We will maintain Hb greater than 8 in the setting of CHF Continue to hold Eliquis.  SCDs for DVT prophylaxis Continue CBC Plan of care discussed with urologist and currently no surgical intervention planned She received 1 unit of blood transfusion yesterday Hemoglobin 7.6 today which is below target of 8 We will give another unit of blood today Continuous bladder irrigation has been discontinued We will continue voiding trial Plan of care discussed with urology   CAD s/p staged PCI x 2 3/21/ and 11/28/2023 HTN HLD. Continue DAPT due to recent procedure, atorvastatin   Syncope, likely secondary to orthostatic hypotension - Received an IV fluid bolus in the ED with improvement - Continue to monitor BP -Midodrine was being considered at [facility]  Chronic systolic  HF  Severe AS  -GDMT limited due to orthostatic hypotension  -Holding Coreg and lisinopril.    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  Will continue.   Diabetes Recent A1c 8.8% Sliding scale insulin coverage    DVT prophylaxis: SCDs   Consults: Allergy   Advance Care Planning:   Code Status: Prior    Family Communication: Discussed with patient's daughter at bedside   Disposition Plan: Back to previous home environment     Subjective:  Patient seen and examined at bedside this morning He did have some orthostatic hypotension when he started working with PT today with associated dizziness Hemoglobin still below target in the setting of cardiac disease and so patient being transfused another unit of blood today He denies chest pain or worsening hematuria Continuous bladder irrigation has been discontinued We will continue voiding trial   Physical Exam:   Vitals and nursing note reviewed.  Constitutional:      General: He is not in acute distress. HENT:     Head: Normocephalic and atraumatic.  Cardiovascular:     Rate and Rhythm: Normal rate and regular rhythm.     Heart sounds: Normal heart sounds.  Pulmonary:     Effort: Pulmonary effort is normal.     Breath sounds: Normal breath sounds.  Abdominal:     Palpations: Abdomen is soft.     Tenderness: There is no abdominal tenderness.  Genitourinary:    Comments: Foley with red urine with clots Neurological:     Mental Status: Mental status is at baseline.     Data Reviewed:   Have reviewed patient's CT scan report  Latest Ref Rng & Units 12/07/2023    1:35 AM 12/06/2023    4:34 PM 12/06/2023    4:28 AM  CBC  WBC 4.0 - 10.5 K/uL 6.6  5.7  5.6   Hemoglobin 13.0 - 17.0 g/dL 7.6  7.7  7.0   Hematocrit 39.0 - 52.0 % 23.7  23.8  22.2   Platelets 150 - 400 K/uL 229  228  240        Latest Ref Rng & Units 12/07/2023    1:35 AM 12/05/2023    7:51 PM 11/22/2023    5:24 AM  BMP  Glucose 70 -  99 mg/dL 784  696  295   BUN 8 - 23 mg/dL 35  35  52   Creatinine 0.61 - 1.24 mg/dL 2.84  1.32  4.40   Sodium 135 - 145 mmol/L 132  135  138   Potassium 3.5 - 5.1 mmol/L 4.6  4.8  4.5   Chloride 98 - 111 mmol/L 101  104  103   CO2 22 - 32 mmol/L 24  24  30    Calcium 8.9 - 10.3 mg/dL 7.5  8.0  8.7      Vitals:   12/07/23 0826 12/07/23 1127 12/07/23 1152 12/07/23 1205  BP: 118/71 102/62 121/66 (!) 112/57  Pulse: 64 60 70 72  Resp: 18 17  18   Temp: 97.9 F (36.6 C)  99.2 F (37.3 C) 97.9 F (36.6 C)  TempSrc:  Oral Oral   SpO2: 100% 98%  100%  Weight:      Height:         Author: Loyce Dys, MD 12/07/2023 2:09 PM  For on call review [email or web address].

## 2023-12-07 NOTE — Care Management Obs Status (Signed)
 MEDICARE OBSERVATION STATUS NOTIFICATION   Patient Details  Name: Mathew Baker MRN: [id number] Date of Birth: 1940/12/04   Medicare Observation Status Notification Given:  Orland Dec, CMA 12/07/2023, 9:51 AM

## 2023-12-07 NOTE — Plan of Care (Signed)
   Problem: Coping: Goal: Ability to adjust to condition or change in health will improve Outcome: Progressing   Problem: Fluid Volume: Goal: Ability to maintain a balanced intake and output will improve Outcome: Progressing

## 2023-12-07 NOTE — Progress Notes (Signed)
 Transition of Care [facility]) - Inpatient Brief Assessment   Patient Details  Name: Mathew Baker MRN: [id number] Date of Birth: Jun 07, 1941  Transition of Care [facility]) CM/SW Contact:    Truddie Hidden, RN Phone Number: 12/07/2023, 2:03 PM   Clinical Narrative: TOC continuing to follow patient's progress throughout discharge planning.   Transition of Care Asessment: Insurance and Status: Insurance coverage has been reviewed Patient has primary care physician: Yes   Prior level of function:: Independent Prior/Current Home Services: No current home services Social Drivers of Health Review: SDOH reviewed no interventions necessary Readmission risk has been reviewed: Yes Transition of care needs: no transition of care needs at this time

## 2023-12-07 NOTE — Evaluation (Signed)
 Occupational Therapy Evaluation Patient Details Name: Mathew Baker MRN: [id number] DOB: June 10, 1941 Today's Date: 12/07/2023   History of Present Illness   Mathew Baker is a 83 y.o. male with medical history significant for COPD, T2DM, CVA, hyperlipidemia, hypertension, cholecystitis s/p cholecystectomy and hepatocellular carcinoma transferred from [facility] to [facility] on 3/18 for multivessel disease undergoing staged PCI, with stay complicated by orthostatic hypotension with recommendation for consideration of midodrine, as well as urinary retention, discharged on Foley, was brought into the ED with complaints of blood in the urine bag and decreased output from Foley.  On arrival of EMS, he had a brief syncopal episode     Clinical Impressions Patient received for OT evaluation. See flowsheet below for details of function. Generally, patient requiring MOD (I) for bed mobility, CGA for functional mobility (only a few steps away from bed with RW due to risk of syncope; pt did get short of breath and dizzy after 2 minutes standing), and supervision-MIN A for ADLs. Patient will benefit from continued OT while in acute care.      If plan is discharge home, recommend the following:   A little help with walking and/or transfers;A little help with bathing/dressing/bathroom;Assistance with cooking/housework;Assist for transportation     Functional Status Assessment   Patient has had a recent decline in their functional status and demonstrates the ability to make significant improvements in function in a reasonable and predictable amount of time.     Equipment Recommendations   Tub/shower seat     Recommendations for Other Services         Precautions/Restrictions   Precautions Precautions: Fall;Other (comment) (watch BP) Recall of Precautions/Restrictions: Intact Restrictions Weight Bearing Restrictions Per Provider Order: No     Mobility Bed Mobility Overal bed  mobility: Modified Independent                  Transfers Overall transfer level: Needs assistance Equipment used: Rolling walker (2 wheels) Transfers: Sit to/from Stand Sit to Stand: Contact guard assist (with extra time and cues for hand placement)           General transfer comment: pt able to walk 4 feet forward to sink with RW CGA; able to step backwards 4 feet to bed once becoming dizzy and needing to sit.      Balance Overall balance assessment: Mild deficits observed, not formally tested                                         ADL either performed or assessed with clinical judgement   ADL Overall ADL's : Needs assistance/impaired                                       General ADL Comments: Pt able to stand at sink x2 minutes with supervision at [facility] for grooming task (face wash, oral care), then pt became dizzy and short of breath; resolved upon sitting at EOB, although pt slightly leaning L and not noticing/correcting until prompted. Pt able to adjust BIL socks in figure four position while seated EOB. Concern for syncopal episodes during ADL/mobility. Discussed safety at home if symptoms continue.     Vision Patient Visual Report: No change from baseline       Perception  Praxis         Pertinent Vitals/Pain Pain Assessment Pain Assessment: No/denies pain     Extremity/Trunk Assessment Upper Extremity Assessment Upper Extremity Assessment: Overall WFL for tasks assessed (hx of stroke affecting R side; no functional impairments.)   Lower Extremity Assessment Lower Extremity Assessment: Overall WFL for tasks assessed (chronic RLE edema)   Cervical / Trunk Assessment Cervical / Trunk Assessment:  (normal, although at EOB after pt reported dizziness pt was leaning slightly to the L; able to correct with several cues and mirror.)   Communication Communication Communication: Impaired Factors Affecting  Communication: Hearing impaired   Cognition Arousal: Alert Behavior During Therapy: WFL for tasks assessed/performed Cognition: No apparent impairments             OT - Cognition Comments: Daughter did add some details; pt A+Ox4 and cooperative.                 Following commands: Intact       Cueing  General Comments   Cueing Techniques: Verbal cues  Orthostatics checked at beginning of session: Supine 101/62, sitting 114/54, standing 98/46. After feeling dizzy in standing, pt sat and BP 108/50. O2 100%. When recovering on EOB HR dropped into 30's for about 30 seconds. NT called for new telemetry battery (bradycardia noted via pulseoximeter and confirmed manually). MD notified.   Exercises     Shoulder Instructions      Home Living Family/patient expects to be discharged to:: Private residence Living Arrangements: Spouse/significant other Available Help at Discharge: Family;Available 24 hours/day Type of Home: House Home Access: Level entry     Home Layout: One level     Bathroom Shower/Tub: Producer, television/film/video: Standard (with BSC over toilet)     Home Equipment: Rollator (4 wheels);BSC/3in1;Electric scooter   Additional Comments: Pt's daughter lives 1 mile away.      Prior Functioning/Environment Prior Level of Function : Independent/Modified Independent;Driving             Mobility Comments: rollator at baseline; 1 fall last week after d/c home from [facility] while pt trying to get into his bed (which was very high; has since been replaced with regular height bed). ADLs Comments: Pt tyipcally MOD (I), driving; wife does household IADLs. Pt (I) with meds.    OT Problem List: Decreased activity tolerance;Impaired balance (sitting and/or standing)   OT Treatment/Interventions: Self-care/ADL training;Therapeutic exercise;Therapeutic activities;Patient/family education      OT Goals(Current goals can be found in the care plan  section)   Acute Rehab OT Goals Patient Stated Goal: get better; go home OT Goal Formulation: With patient Time For Goal Achievement: 12/21/23 Potential to Achieve Goals: Good ADL Goals Pt Will Perform Grooming: with modified independence;standing Pt Will Transfer to Toilet: with modified independence;ambulating;bedside commode Pt Will Perform Tub/Shower Transfer: Shower transfer;ambulating   OT Frequency:  Min 2X/week    Co-evaluation              AM-PAC OT "6 Clicks" Daily Activity     Outcome Measure Help from another person eating meals?: None Help from another person taking care of personal grooming?: None Help from another person toileting, which includes using toliet, bedpan, or urinal?: A Little Help from another person bathing (including washing, rinsing, drying)?: A Little Help from another person to put on and taking off regular upper body clothing?: None Help from another person to put on and taking off regular lower body clothing?: None 6 Click Score:  22   End of Session Equipment Utilized During Treatment: Rolling walker (2 wheels) Nurse Communication: Mobility status  Activity Tolerance: Other (comment) (limited by dizziness/SOB after short bout of standing.) Patient left: in bed;with call bell/phone within reach;with bed alarm set (bed in chair position)  OT Visit Diagnosis: History of falling (Z91.81)                Time: 1000-1040 OT Time Calculation (min): 40 min Charges:  OT General Charges $OT Visit: 1 Visit OT Evaluation $OT Eval Moderate Complexity: 1 Mod OT Treatments $Self Care/Home Management : 8-22 mins  Linward Foster, MS, OTR/L  Alvester Morin 12/07/2023, 12:26 PM

## 2023-12-08 DIAGNOSIS — R310 Gross hematuria: Secondary | ICD-10-CM | POA: Diagnosis not present

## 2023-12-08 LAB — URINE CULTURE: Culture: NO GROWTH

## 2023-12-08 LAB — CBC WITH DIFFERENTIAL/PLATELET
Abs Immature Granulocytes: 0.02 10*3/uL (ref 0.00–0.07)
Basophils Absolute: 0 10*3/uL (ref 0.0–0.1)
Basophils Relative: 1 %
Eosinophils Absolute: 0.1 10*3/uL (ref 0.0–0.5)
Eosinophils Relative: 2 %
HCT: 27.4 % — ABNORMAL LOW (ref 39.0–52.0)
Hemoglobin: 8.9 g/dL — ABNORMAL LOW (ref 13.0–17.0)
Immature Granulocytes: 0 %
Lymphocytes Relative: 19 %
Lymphs Abs: 1.4 10*3/uL (ref 0.7–4.0)
MCH: 33.1 pg (ref 26.0–34.0)
MCHC: 32.5 g/dL (ref 30.0–36.0)
MCV: 101.9 fL — ABNORMAL HIGH (ref 80.0–100.0)
Monocytes Absolute: 0.8 10*3/uL (ref 0.1–1.0)
Monocytes Relative: 11 %
Neutro Abs: 4.9 10*3/uL (ref 1.7–7.7)
Neutrophils Relative %: 67 %
Platelets: 243 10*3/uL (ref 150–400)
RBC: 2.69 MIL/uL — ABNORMAL LOW (ref 4.22–5.81)
RDW: 21.9 % — ABNORMAL HIGH (ref 11.5–15.5)
WBC: 7.3 10*3/uL (ref 4.0–10.5)
nRBC: 0 % (ref 0.0–0.2)

## 2023-12-08 LAB — BPAM RBC
Blood Product Expiration Date: 202504292359
Blood Product Expiration Date: 202504272359
ISSUE DATE / TIME: 202504021121
ISSUE DATE / TIME: 202504011207
Unit Type and Rh: 5100
Unit Type and Rh: 7300

## 2023-12-08 LAB — TYPE AND SCREEN
ABO/RH(D): B POS
Antibody Screen: NEGATIVE
Unit division: 0
Unit division: 0

## 2023-12-08 LAB — GLUCOSE, CAPILLARY
Glucose-Capillary: 123 mg/dL — ABNORMAL HIGH (ref 70–99)
Glucose-Capillary: 65 mg/dL — ABNORMAL LOW (ref 70–99)
Glucose-Capillary: 70 mg/dL (ref 70–99)
Glucose-Capillary: 91 mg/dL (ref 70–99)
Glucose-Capillary: 97 mg/dL (ref 70–99)

## 2023-12-08 MED ORDER — LACTULOSE 10 GM/15ML PO SOLN
30.0000 g | Freq: Two times a day (BID) | ORAL | Status: DC | PRN
Start: 2023-12-08 — End: 2023-12-08
  Filled 2023-12-08: qty 60

## 2023-12-08 MED ORDER — ENSURE ENLIVE PO LIQD
237.0000 mL | Freq: Two times a day (BID) | ORAL | Status: DC
  Administered 2023-12-08: 237 mL via ORAL

## 2023-12-08 NOTE — Plan of Care (Signed)
  Problem: Coping: Goal: Ability to adjust to condition or change in health will improve Outcome: Progressing   Problem: Fluid Volume: Goal: Ability to maintain a balanced intake and output will improve Outcome: Progressing   Problem: Metabolic: Goal: Ability to maintain appropriate glucose levels will improve Outcome: Progressing   Problem: Tissue Perfusion: Goal: Adequacy of tissue perfusion will improve Outcome: Progressing   

## 2023-12-08 NOTE — TOC Transition Note (Signed)
 Transition of Care [facility]) - Discharge Note   Patient Details  Name: Mathew Baker MRN: [id number] Date of Birth: July 10, 1941  Transition of [facility] CM/SW Contact:  Truddie Hidden, RN Phone Number: 12/08/2023, 11:26 AM   Clinical Narrative:    Message received from Shaun at [facility] stating patient is active for [facility] PT/OT .  Spoke with patient at bedside regarding his disposition vs his daughter's concerns about him returning home. Patient stated he has HH and is going home.   11:35AM Spoke with Shaun from [facility] to advised patient is discharging home.   TOC signing off.           Patient Goals and CMS Choice            Discharge Placement                       Discharge Plan and Services Additional resources added to the After Visit Summary for                                       Social Drivers of Health (SDOH) Interventions SDOH Screenings   Food Insecurity: No Food Insecurity (12/06/2023)  Housing: Low Risk  (12/06/2023)  Transportation Needs: No Transportation Needs (12/06/2023)  Utilities: Not At Risk (12/06/2023)  Financial Resource Strain: Low Risk  (11/26/2023)   Received from [facility]  Social Connections: Moderately Isolated (12/06/2023)  Tobacco Use: Medium Risk (12/05/2023)     Readmission Risk Interventions     No data to display

## 2023-12-08 NOTE — Plan of Care (Signed)
  Problem: Clinical Measurements: Goal: Ability to maintain clinical measurements within normal limits will improve Outcome: Progressing   Problem: Tissue Perfusion: Goal: Adequacy of tissue perfusion will improve Outcome: Progressing   Problem: Nutritional: Goal: Maintenance of adequate nutrition will improve Outcome: Progressing   Problem: Coping: Goal: Ability to adjust to condition or change in health will improve Outcome: Progressing

## 2023-12-08 NOTE — Progress Notes (Signed)
 Physical Therapy Treatment Patient Details Name: Mathew Baker MRN: [id number] DOB: 01/04/1941 Today's Date: 12/08/2023   History of Present Illness Mathew Baker is a 83 y.o. male with medical history significant for COPD, T2DM, CVA, hyperlipidemia, hypertension, cholecystitis s/p cholecystectomy and hepatocellular carcinoma transferred from [facility] to [facility] on 3/18 for multivessel disease undergoing staged PCI, with stay complicated by orthostatic hypotension with recommendation for consideration of midodrine, as well as urinary retention, discharged on Foley, was brought into the ED with complaints of blood in the urine bag and decreased output from Foley.  On arrival of EMS, he had a brief syncopal episode    PT Comments  Pt was long sitting in bed upon arrival. He is A an O x 4 and agreeable to session. Pt stated he wants to go home. Daughter arrived and was concerned with this plan however with more discussion is agreeable to [facility] services + will hire private caregivers. Overall pt is limited mostly by fatigue. He did require some assistance to safely exit bed, stand, and ambulate ~ 100 ft total. Heavy reliance on RW. MD/TOC aware. Pt will continue to benefit form killed PT to maximize his independence and safety with all ADLs.     If plan is discharge home, recommend the following: A little help with walking and/or transfers;A little help with bathing/dressing/bathroom;Assist for transportation     Equipment Recommendations  None recommended by PT       Precautions / Restrictions Precautions Precautions: Fall Recall of Precautions/Restrictions: Intact Restrictions Weight Bearing Restrictions Per Provider Order: No     Mobility  Bed Mobility Overal bed mobility: Needs Assistance Bed Mobility: Supine to Sit  Supine to sit: Supervision  General bed mobility comments: pt did require use of bed rail to achieve EOB sitting from flat bed surface    Transfers Overall transfer  level: Needs assistance Equipment used: Rolling walker (2 wheels) Transfers: Sit to/from Stand Sit to Stand: Contact guard assist  General transfer comment: CGA for safety. Vcs for improved technique. Did required several attempts to achieve standing with rocking method form lowest bed height    Ambulation/Gait Ambulation/Gait assistance: Contact guard assist, Min assist Gait Distance (Feet): 100 Feet Assistive device: Rolling walker (2 wheels) Gait Pattern/deviations: Step-through pattern, Decreased step length - right, Decreased step length - left, Decreased stride length, Trunk flexed Gait velocity: dcreased  General Gait Details: Pt tolerated ambulation ~ 100 ft however after 50 ft endorses fatigue/increased dizziness and need to return to room. Returned to room and checks BP again. BP map remains > 73 throughout    Balance Overall balance assessment: Needs assistance, Modified Independent Sitting-balance support: Feet supported Sitting balance-Leahy Scale: Good     Standing balance support: Bilateral upper extremity supported, During functional activity, Reliant on assistive device for balance Standing balance-Leahy Scale: Good       Communication Communication Communication: No apparent difficulties  Cognition Arousal: Alert Behavior During Therapy: WFL for tasks assessed/performed   PT - Cognitive impairments: No apparent impairments    PT - Cognition Comments: Pt is A and O x 4. Still endorsing slight dizziness even at rest but was cooperative and agreeable to OOB activity Following commands: Intact      Cueing Cueing Techniques: Verbal cues         Pertinent Vitals/Pain Pain Assessment Pain Assessment: No/denies pain     PT Goals (current goals can now be found in the care plan section) Acute Rehab PT Goals Patient Stated Goal: return  home Progress towards PT goals: Progressing toward goals    Frequency            AM-PAC PT "6 Clicks" Mobility    Outcome Measure  Help needed turning from your back to your side while in a flat bed without using bedrails?: None Help needed moving from lying on your back to sitting on the side of a flat bed without using bedrails?: A Little Help needed moving to and from a bed to a chair (including a wheelchair)?: A Little Help needed standing up from a chair using your arms (e.g., wheelchair or bedside chair)?: A Little Help needed to walk in hospital room?: A Little Help needed climbing 3-5 steps with a railing? : A Lot 6 Click Score: 18    End of Session   Activity Tolerance: Patient limited by fatigue;Treatment limited secondary to medical complications (Comment) (concerbns of dizziness/quick onset of dizziness) Patient left: in chair;with call bell/phone within reach;with family/visitor present Nurse Communication: Mobility status PT Visit Diagnosis: Unsteadiness on feet (R26.81);Muscle weakness (generalized) (M62.81);Difficulty in walking, not elsewhere classified (R26.2)     Time: 0865-7846 PT Time Calculation (min) (ACUTE ONLY): 18 min  Charges:    $Therapeutic Activity: 8-22 mins PT General Charges $$ ACUTE PT VISIT: 1 Visit                     Jetta Lout PTA 12/08/23, 11:30 AM

## 2023-12-08 NOTE — Discharge Summary (Signed)
 Physician Discharge Summary   Patient: Mathew Baker MRN: [id number] DOB: [birth year]  Admit date:     12/05/2023  Discharge date: 12/08/23  Discharge Physician: Loyce Dys   PCP: Lauro Regulus, MD   Recommendations at discharge:  Follow-up with urology  Discharge Diagnoses:  Hematuria with acute blood loss anemia, on Eliquis Foley catheter 3/26 secondary to urinary retention CAD s/p staged PCI x 2 3/21/ and 11/28/2023 HTN HLD. Syncope, likely secondary to orthostatic hypotension Chronic systolic HF  Severe AS   Paroxysmal afib on chronic anticoagulation  Diabetes  Hospital Course: Mathew Baker is a 83 y.o. male with medical history significant for COPD, T2DM, CVA, hyperlipidemia, hypertension, cholecystitis s/p cholecystectomy and hepatocellular carcinoma transferred from [facility] to [facility] on 3/18 for multivessel disease undergoing staged PCI, with stay complicated by orthostatic hypotension with recommendation for consideration of midodrine, as well as urinary retention, discharged on Foley, was brought into the ED with complaints of blood in the urine bag and decreased output from Foley.  On arrival of EMS, he had a brief syncopal episode.   CTA PE chest negative for PE showing small bilateral pleural effusions CT abdomen and pelvis nonacute CT head and C-spine nontraumatic bilateral lower extremity venous ultrasound nonacute patient treated with IV fluid bolus.  Patient was seen by urologist and underwent continuous cardiac medication with resolution of hematuria.  Patient Foley catheter has been removed with positive void trial successful.  Patient has been cleared for discharge today with home health.   Consultants: Urology Procedures performed: CBI Disposition: Home health Diet recommendation:  Cardiac diet DISCHARGE MEDICATION: Allergies as of 12/08/2023   No Known Allergies      Medication List     STOP taking these medications    azithromycin 250 MG  tablet Commonly known as: ZITHROMAX   furosemide 10 MG/ML injection Commonly known as: LASIX   heparin 40981 UT/250ML infusion       TAKE these medications    Alcohol Wipes 70 % Pads USE FOR INSULIN INJECTIONS OR TO FINGER STICK AS DIRECTED BY YOUR MEDICAL PROVIDER   aspirin EC 81 MG tablet Take 1 tablet (81 mg total) by mouth daily. Swallow whole.   atorvastatin 80 MG tablet Commonly known as: LIPITOR Take 80 mg by mouth daily.   carvedilol 3.125 MG tablet Commonly known as: COREG Take 1 tablet (3.125 mg total) by mouth 2 (two) times daily with a meal.   clopidogrel 75 MG tablet Commonly known as: PLAVIX Take 75 mg by mouth daily.   docusate sodium 100 MG capsule Commonly known as: COLACE Take 100 mg by mouth 2 (two) times daily.   empagliflozin 25 MG Tabs tablet Commonly known as: JARDIANCE Take 12.5 mg by mouth daily.   feeding supplement Liqd Take 237 mLs by mouth 3 (three) times daily between meals.   HYDROcodone-acetaminophen 5-325 MG tablet Commonly known as: NORCO/VICODIN Take 1 tablet by mouth every 8 (eight) hours as needed.   insulin aspart 100 UNIT/ML injection Commonly known as: novoLOG Inject 0-6 Units into the skin 3 (three) times daily as needed for high blood sugar.   ipratropium-albuterol 0.5-2.5 (3) MG/3ML Soln Commonly known as: DUONEB Take 3 mLs by nebulization every 4 (four) hours.   Lantus 100 UNIT/ML injection Generic drug: insulin glargine Inject 50 Units into the skin 2 (two) times daily.   latanoprost 0.005 % ophthalmic solution Commonly known as: XALATAN Place 1 drop into both eyes at bedtime.   midodrine 5 MG  tablet Commonly known as: PROAMATINE Take 1 tablet by mouth 2 (two) times daily.   nitroGLYCERIN 0.4 MG SL tablet Commonly known as: NITROSTAT Place 0.4 mg under the tongue every 5 (five) minutes as needed for chest pain.   ondansetron 4 MG/2ML Soln injection Commonly known as: ZOFRAN Inject 2 mLs (4 mg total)  into the vein every 6 (six) hours as needed for nausea or vomiting.   pantoprazole 40 MG tablet Commonly known as: PROTONIX Take 1 tablet (40 mg total) by mouth daily.   PEN NEEDLES 29GX1/2" 29G X Misc Use 1x a day   polyethylene glycol 17 g packet Commonly known as: MIRALAX / GLYCOLAX Take 17 g by mouth daily as needed for moderate constipation.   Precision QID Test test strip Generic drug: glucose blood Use to test blood sugar 2 times daily as instructed. Dx: E11.65   tamsulosin 0.4 MG Caps capsule Commonly known as: FLOMAX Take 0.4 mg by mouth at bedtime.   vitamin B-12 100 MCG tablet Commonly known as: CYANOCOBALAMIN Take 100 mcg by mouth daily.        Discharge Exam: Ceasar Mons Weights   12/05/23 1935 12/07/23 0500  Weight: 91.2 kg 93.5 kg   Vitals and nursing note reviewed.  Constitutional:      General: He is not in acute distress. HENT:     Head: Normocephalic and atraumatic.  Cardiovascular:     Rate and Rhythm: Normal rate and regular rhythm.     Heart sounds: Normal heart sounds.  Pulmonary:     Effort: Pulmonary effort is normal.     Breath sounds: Normal breath sounds.  Abdominal:     Palpations: Abdomen is soft.     Tenderness: There is no abdominal tenderness.  Genitourinary: Foley catheter out Neurological:     Mental Status: Mental status is at baseline  Condition at discharge: good  The results of significant diagnostics from this hospitalization (including imaging, microbiology, ancillary and laboratory) are listed below for reference.   Imaging Studies: US Venous Img Lower Bilateral Result Date: 12/06/2023 CLINICAL DATA:  Lower extremity swelling EXAM: BILATERAL LOWER EXTREMITY VENOUS DOPPLER ULTRASOUND TECHNIQUE: Gray-scale sonography with compression, as well as color and duplex ultrasound, were performed to evaluate the deep venous system(s) from the level of the common femoral vein through the popliteal and proximal calf veins.  COMPARISON:  None Available. FINDINGS: VENOUS Normal compressibility of the common femoral, superficial femoral, and popliteal veins, as well as the visualized calf veins. Visualized portions of profunda femoral vein and great saphenous vein unremarkable. No filling defects to suggest DVT on grayscale or color Doppler imaging. Doppler waveforms show normal direction of venous flow, normal respiratory plasticity and response to augmentation. Limited views of the contralateral common femoral vein are unremarkable. OTHER None. Limitations: none IMPRESSION: Negative. Electronically Signed   By: Deatra Robinson M.D.   On: 12/06/2023 02:45   CT Angio Chest PE W/Cm &/Or Wo Cm Result Date: 12/06/2023 CLINICAL DATA:  Fall, blunt chest trauma, lower abdominal ecchymosis, hematuria EXAM: CT ANGIOGRAPHY CHEST CT ABDOMEN AND PELVIS WITH CONTRAST TECHNIQUE: Multidetector CT imaging of the chest was performed using the standard protocol during bolus administration of intravenous contrast. Multiplanar CT image reconstructions and MIPs were obtained to evaluate the vascular anatomy. Multidetector CT imaging of the abdomen and pelvis was performed using the standard protocol during bolus administration of intravenous contrast. RADIATION DOSE REDUCTION: This exam was performed according to the departmental dose-optimization program which includes automated exposure control,  adjustment of the mA and/or kV according to patient size and/or use of iterative reconstruction technique. CONTRAST:  OMNIPAQUE IOHEXOL 350 MG/ML SOLN COMPARISON:  11/18/2023 FINDINGS: CTA CHEST FINDINGS Cardiovascular: There is adequate opacification of the pulmonary arterial tree. No intraluminal filling defect identified to suggest acute pulmonary embolism. The central pulmonary arteries are of normal caliber. There is extensive multi-vessel coronary artery calcification. Advanced calcification of the aortic valve leaflets. Global cardiac size is within  normal limits. No pericardial effusion. Moderate atherosclerotic calcification within the thoracic aorta. No aortic aneurysm. Mediastinum/Nodes: No enlarged mediastinal, hilar, or axillary lymph nodes. Thyroid gland, trachea, and esophagus demonstrate no significant findings. Lungs/Pleura: Moderate right and small left pleural effusions are again identified, slightly enlarged on the right with compressive atelectasis of the lungs dependently. There is progressive subtotal collapse of the right lower lobe. No pneumothorax. No central obstructing lesion Musculoskeletal: Osseous structures are age appropriate. No acute bone abnormality. No lytic or blastic bone lesion. Review of the MIP images confirms the above findings. CT ABDOMEN and PELVIS FINDINGS Hepatobiliary: 25 mm hypodense lesions seen within left hepatic dome, similar to prior examination, but indeterminate. The liver is otherwise unremarkable. No intra or extrahepatic biliary ductal dilation. Status post cholecystectomy. Pancreas: Unremarkable Spleen: Unremarkable Adrenals/Urinary Tract: Adrenal glands are unremarkable. The kidneys are normal in size and position. Mild right renal cortical scarring. Stable moderate bilateral nonspecific perinephric edema. No hydronephrosis. No intrarenal or ureteral calculi. The bladder is decompressed with a Foley catheter balloon seen within its lumen. Stomach/Bowel: Mild sigmoid diverticulosis. Stomach, small bowel, and large bowel are otherwise unremarkable. Appendix normal. No evidence of obstruction or focal inflammation. No free intraperitoneal gas or fluid. Vascular/Lymphatic: Extensive aortoiliac atherosclerotic calcification with particularly atherosclerotic calcification at the origin of the mesenteric and renal arterial vasculature no pathologic adenopathy within the abdomen and pelvis. Reproductive: More prostatic hypertrophy. Other: Moderate diffuse subcutaneous body wall edema. No abdominal hernia.  Musculoskeletal: No acute bone abnormality. No lytic or blastic lesion. Review of the MIP images confirms the above findings. IMPRESSION: 1. No pulmonary embolism. No acute intrathoracic or intra-abdominal pathology identified. 2. Moderate right and small left pleural effusions, slightly enlarged on the right with progressive subtotal collapse of the right lower lobe. 3. 25 mm hypodense lesion within the left hepatic dome, similar to prior examination, but indeterminate. This could be better assessed with hepatic protocol MRI examination when clinically appropriate. 4. Mild sigmoid diverticulosis. 5. Moderate diffuse subcutaneous body wall edema. Aortic Atherosclerosis (ICD10-I70.0). Electronically Signed   By: Helyn Numbers M.D.   On: 12/06/2023 01:36   CT ABDOMEN PELVIS W CONTRAST Result Date: 12/06/2023 CLINICAL DATA:  Fall, blunt chest trauma, lower abdominal ecchymosis, hematuria EXAM: CT ANGIOGRAPHY CHEST CT ABDOMEN AND PELVIS WITH CONTRAST TECHNIQUE: Multidetector CT imaging of the chest was performed using the standard protocol during bolus administration of intravenous contrast. Multiplanar CT image reconstructions and MIPs were obtained to evaluate the vascular anatomy. Multidetector CT imaging of the abdomen and pelvis was performed using the standard protocol during bolus administration of intravenous contrast. RADIATION DOSE REDUCTION: This exam was performed according to the departmental dose-optimization program which includes automated exposure control, adjustment of the mA and/or kV according to patient size and/or use of iterative reconstruction technique. CONTRAST:  OMNIPAQUE IOHEXOL 350 MG/ML SOLN COMPARISON:  11/18/2023 FINDINGS: CTA CHEST FINDINGS Cardiovascular: There is adequate opacification of the pulmonary arterial tree. No intraluminal filling defect identified to suggest acute pulmonary embolism. The central pulmonary arteries are of normal  caliber. There is extensive  multi-vessel coronary artery calcification. Advanced calcification of the aortic valve leaflets. Global cardiac size is within normal limits. No pericardial effusion. Moderate atherosclerotic calcification within the thoracic aorta. No aortic aneurysm. Mediastinum/Nodes: No enlarged mediastinal, hilar, or axillary lymph nodes. Thyroid gland, trachea, and esophagus demonstrate no significant findings. Lungs/Pleura: Moderate right and small left pleural effusions are again identified, slightly enlarged on the right with compressive atelectasis of the lungs dependently. There is progressive subtotal collapse of the right lower lobe. No pneumothorax. No central obstructing lesion Musculoskeletal: Osseous structures are age appropriate. No acute bone abnormality. No lytic or blastic bone lesion. Review of the MIP images confirms the above findings. CT ABDOMEN and PELVIS FINDINGS Hepatobiliary: 25 mm hypodense lesions seen within left hepatic dome, similar to prior examination, but indeterminate. The liver is otherwise unremarkable. No intra or extrahepatic biliary ductal dilation. Status post cholecystectomy. Pancreas: Unremarkable Spleen: Unremarkable Adrenals/Urinary Tract: Adrenal glands are unremarkable. The kidneys are normal in size and position. Mild right renal cortical scarring. Stable moderate bilateral nonspecific perinephric edema. No hydronephrosis. No intrarenal or ureteral calculi. The bladder is decompressed with a Foley catheter balloon seen within its lumen. Stomach/Bowel: Mild sigmoid diverticulosis. Stomach, small bowel, and large bowel are otherwise unremarkable. Appendix normal. No evidence of obstruction or focal inflammation. No free intraperitoneal gas or fluid. Vascular/Lymphatic: Extensive aortoiliac atherosclerotic calcification with particularly atherosclerotic calcification at the origin of the mesenteric and renal arterial vasculature no pathologic adenopathy within the abdomen and pelvis.  Reproductive: More prostatic hypertrophy. Other: Moderate diffuse subcutaneous body wall edema. No abdominal hernia. Musculoskeletal: No acute bone abnormality. No lytic or blastic lesion. Review of the MIP images confirms the above findings. IMPRESSION: 1. No pulmonary embolism. No acute intrathoracic or intra-abdominal pathology identified. 2. Moderate right and small left pleural effusions, slightly enlarged on the right with progressive subtotal collapse of the right lower lobe. 3. 25 mm hypodense lesion within the left hepatic dome, similar to prior examination, but indeterminate. This could be better assessed with hepatic protocol MRI examination when clinically appropriate. 4. Mild sigmoid diverticulosis. 5. Moderate diffuse subcutaneous body wall edema. Aortic Atherosclerosis (ICD10-I70.0). Electronically Signed   By: Helyn Numbers M.D.   On: 12/06/2023 01:36   CT Head Wo Contrast Result Date: 12/06/2023 CLINICAL DATA:  Syncopal episode with head trauma. On blood thinner. Neck trauma. EXAM: CT HEAD WITHOUT CONTRAST CT CERVICAL SPINE WITHOUT CONTRAST TECHNIQUE: Multidetector CT imaging of the head and cervical spine was performed following the standard protocol without intravenous contrast. Multiplanar CT image reconstructions of the cervical spine were also generated. RADIATION DOSE REDUCTION: This exam was performed according to the departmental dose-optimization program which includes automated exposure control, adjustment of the mA and/or kV according to patient size and/or use of iterative reconstruction technique. COMPARISON:  CT head and cervical spine 09/30/2022 FINDINGS: CT HEAD FINDINGS Brain: No intracranial hemorrhage, mass effect, or evidence of acute infarct. No hydrocephalus. No extra-axial fluid collection. Age related cerebral atrophy and chronic small vessel ischemic disease. Chronic bilateral cerebellar infarcts. Chronic lacunar infarct in the right subinsular cortex. Vascular: No  hyperdense vessel. Intracranial arterial calcification. Skull: No fracture or focal lesion. Sinuses/Orbits: No acute finding. Other: None. CT CERVICAL SPINE FINDINGS Alignment: No evidence of traumatic malalignment. Leftward rotation of the axis around the dens is favored positional. Skull base and vertebrae: No acute fracture. No primary bone lesion or focal pathologic process. Soft tissues and spinal canal: No prevertebral fluid or swelling. No visible canal hematoma.  Disc levels: Multilevel advanced spondylosis with bulky bridging anterior osteophytes, disc space height loss and degenerative endplate changes greatest at C5-C6. Multilevel mild to moderate effacement of the ventral thecal sac due to posterior disc osteophyte complexes greatest at C5-C6 and C6-C7. No severe spinal canal narrowing. Multilevel facet arthropathy. Partially Upper chest: Partially visualized large bilateral pleural effusions. Other: Carotid calcification. IMPRESSION: 1. No acute intracranial abnormality. 2. No acute fracture in the cervical spine. 3. Partially visualized large bilateral pleural effusions. Electronically Signed   By: Minerva Fester M.D.   On: 12/06/2023 01:32   CT Cervical Spine Wo Contrast Result Date: 12/06/2023 CLINICAL DATA:  Syncopal episode with head trauma. On blood thinner. Neck trauma. EXAM: CT HEAD WITHOUT CONTRAST CT CERVICAL SPINE WITHOUT CONTRAST TECHNIQUE: Multidetector CT imaging of the head and cervical spine was performed following the standard protocol without intravenous contrast. Multiplanar CT image reconstructions of the cervical spine were also generated. RADIATION DOSE REDUCTION: This exam was performed according to the departmental dose-optimization program which includes automated exposure control, adjustment of the mA and/or kV according to patient size and/or use of iterative reconstruction technique. COMPARISON:  CT head and cervical spine 09/30/2022 FINDINGS: CT HEAD FINDINGS Brain: No  intracranial hemorrhage, mass effect, or evidence of acute infarct. No hydrocephalus. No extra-axial fluid collection. Age related cerebral atrophy and chronic small vessel ischemic disease. Chronic bilateral cerebellar infarcts. Chronic lacunar infarct in the right subinsular cortex. Vascular: No hyperdense vessel. Intracranial arterial calcification. Skull: No fracture or focal lesion. Sinuses/Orbits: No acute finding. Other: None. CT CERVICAL SPINE FINDINGS Alignment: No evidence of traumatic malalignment. Leftward rotation of the axis around the dens is favored positional. Skull base and vertebrae: No acute fracture. No primary bone lesion or focal pathologic process. Soft tissues and spinal canal: No prevertebral fluid or swelling. No visible canal hematoma. Disc levels: Multilevel advanced spondylosis with bulky bridging anterior osteophytes, disc space height loss and degenerative endplate changes greatest at C5-C6. Multilevel mild to moderate effacement of the ventral thecal sac due to posterior disc osteophyte complexes greatest at C5-C6 and C6-C7. No severe spinal canal narrowing. Multilevel facet arthropathy. Partially Upper chest: Partially visualized large bilateral pleural effusions. Other: Carotid calcification. IMPRESSION: 1. No acute intracranial abnormality. 2. No acute fracture in the cervical spine. 3. Partially visualized large bilateral pleural effusions. Electronically Signed   By: Minerva Fester M.D.   On: 12/06/2023 01:32   CARDIAC CATHETERIZATION Result Date: 11/21/2023   Prox RCA lesion is 75% stenosed.   Mid RCA-1 lesion is 75% stenosed.   Mid RCA-2 lesion is 75% stenosed.   Mid RCA to Dist RCA lesion is 90% stenosed.   Dist RCA lesion is 50% stenosed with 50% stenosed side branch in RPAV.   Ost RCA to Prox RCA lesion is 50% stenosed.   Ost LM to Mid LM lesion is 70% stenosed.   Dist LM lesion is 90% stenosed.   Ost Cx lesion is 100% stenosed.   Ost LAD to Prox LAD lesion is 95%  stenosed.   Prox LAD to Dist LAD lesion is 50% stenosed.   2nd Diag lesion is 75% stenosed.   There is moderate to severe left ventricular systolic dysfunction.   LV end diastolic pressure is moderately elevated.   The left ventricular ejection fraction is 25-35% by visual estimate.   Anticipated discharge date to be determined.   Recommend to resume Apixaban, at currently prescribed dose and frequency. Conclusion Left heart cath right radial approach Non-STEMI shortness of  breath Left ventriculogram Left ventricular enlargement global hypokinesis EF around 30% Coronaries Left main large moderate to severe calcification 70% distal LAD large 90% ostial 95% proximal diffuse 50% moderate to severe calcification proximally Circumflex 100% occluded ostially moderate to severe calcification RCA large serial 75% lesions mid heavy calcification 90% mid to distal moderate calcification right dominant system moderate disease diffusely distally Intervention deferred Recommend evaluation for high risk coronary bypass surgery versus high risk PCI and stent for complex disease Medical therapy is also not unreasonable Will discuss images with tertiary care facility   ECHOCARDIOGRAM COMPLETE Result Date: 11/21/2023    ECHOCARDIOGRAM REPORT   Patient Name:   Mathew Baker Date of Exam: 11/19/2023 Medical Rec #:  [id number]           Height: Accession #:    [id number]          Weight: Date of Birth:  Dec 22, 1940           BSA: Patient Age:    82 years            BP:           99/64 mmHg Patient Gender: M                   HR:           85 bpm. Exam Location:  [facility] Procedure: 2D Echo, Cardiac Doppler and Color Doppler (Both Spectral and Color            Flow Doppler were utilized during procedure). Indications:     Acutre respiratory distress R06.03  History:         Patient has prior history of Echocardiogram examinations.                  Stroke; Risk Factors:Hypertension.  Sonographer:     Neysa Bonito Roar Referring Phys:  Chilton Si MD Diagnosing Phys: Chilton Si MD IMPRESSIONS  1. Left ventricular ejection fraction, by estimation, is 25 to 30%. The left ventricle has severely decreased function. The left ventricle demonstrates global hypokinesis. There is mild concentric left ventricular hypertrophy. Left ventricular diastolic  parameters are consistent with Grade I diastolic dysfunction (impaired relaxation). Elevated left ventricular end-diastolic pressure.  2. Right ventricular systolic function is normal. The right ventricular size is normal. There is normal pulmonary artery systolic pressure.  3. Left atrial size was moderately dilated.  4. The mitral valve is normal in structure. Mild mitral valve regurgitation. No evidence of mitral stenosis.  5. Dimensionless index in 0.23, consistent with severe aortic stenosis. The severity of the aortic stenosis is likely underestimated by mean gradient due to low flow caused by systolic dysfunction.. The aortic valve is calcified. There is severe calcifcation of the aortic valve. There is severe thickening of the aortic valve. Aortic valve regurgitation is mild to moderate. Severe aortic valve stenosis. Aortic valve mean gradient measures 21.0 mmHg. Aortic valve Vmax measures 2.95 m/s.  6. The inferior vena cava is dilated in size with <50% respiratory variability, suggesting right atrial pressure of 15 mmHg. FINDINGS  Left Ventricle: Left ventricular ejection fraction, by estimation, is 25 to 30%. The left ventricle has severely decreased function. The left ventricle demonstrates global hypokinesis. The left ventricular internal cavity size was normal in size. There is mild concentric left ventricular hypertrophy. Left ventricular diastolic parameters are consistent with Grade I diastolic dysfunction (impaired relaxation). Elevated left ventricular end-diastolic pressure. Right Ventricle: The right ventricular size is normal. No increase  in right ventricular wall thickness. Right  ventricular systolic function is normal. There is normal pulmonary artery systolic pressure. The tricuspid regurgitant velocity is 2.09 m/s, and  with an assumed right atrial pressure of 15 mmHg, the estimated right ventricular systolic pressure is 32.5 mmHg. Left Atrium: Left atrial size was moderately dilated. Right Atrium: Right atrial size was normal in size. Pericardium: There is no evidence of pericardial effusion. Mitral Valve: The mitral valve is normal in structure. Mild mitral valve regurgitation. No evidence of mitral valve stenosis. Tricuspid Valve: The tricuspid valve is normal in structure. Tricuspid valve regurgitation is trivial. No evidence of tricuspid stenosis. Aortic Valve: Dimensionless index in 0.23, consistent with severe aortic stenosis. The severity of the aortic stenosis is likely underestimated by mean gradient due to low flow caused by systolic dysfunction. The aortic valve is calcified. There is severe calcifcation of the aortic valve. There is severe thickening of the aortic valve. Aortic valve regurgitation is mild to moderate. Severe aortic stenosis is present. Aortic valve mean gradient measures 21.0 mmHg. Aortic valve peak gradient measures  34.8 mmHg. Pulmonic Valve: The pulmonic valve was normal in structure. Pulmonic valve regurgitation is not visualized. No evidence of pulmonic stenosis. Aorta: The aortic root is normal in size and structure. Venous: The inferior vena cava is dilated in size with less than 50% respiratory variability, suggesting right atrial pressure of 15 mmHg. IAS/Shunts: No atrial level shunt detected by color flow Doppler.  LEFT VENTRICLE PLAX 2D LVIDd:         5.90 cm Diastology LVIDs:         5.00 cm LV e' medial:    3.90 cm/s LV PW:         1.10 cm LV E/e' medial:  34.1 LV IVS:        1.30 cm LV e' lateral:   4.65 cm/s                        LV E/e' lateral: 28.6  RIGHT VENTRICLE RV Basal diam:  3.60 cm RV Mid diam:    3.40 cm RV Length:      6.60 cm RV S  prime:     1340.00 cm/s TAPSE (M-mode): 1.7 cm LEFT ATRIUM LA diam:      4.00 cm LA Vol (A2C): 27.9 ml LA Vol (A4C): 80.3 ml  AORTIC VALVE                    PULMONIC VALVE AV Vmax:           295.00 cm/s  PV Vmax:       83.40 m/s AV Vmean:          173.000 cm/s PV Peak grad:  27822.2 mmHg AV VTI:            0.576 m AV Peak Grad:      34.8 mmHg AV Mean Grad:      21.0 mmHg LVOT Vmax:         67.90 cm/s LVOT Vmean:        43.200 cm/s LVOT VTI:          0.134 m LVOT/AV VTI ratio: 0.23  AORTA Ao STJ diam: 2.9 cm Ao Asc diam: 3.90 cm MV E velocity: 133.00 cm/s  TRICUSPID VALVE  TV Peak grad:   17.5 mmHg                             TV Vmax:        2.09 m/s                             TR Peak grad:   17.5 mmHg                             TR Vmax:        209.00 cm/s                              SHUNTS                             Systemic VTI: 0.13 m Chilton Si MD Electronically signed by Chilton Si MD Signature Date/Time: 11/21/2023/5:53:04 AM    Final    DG Chest Port 1 View Result Date: 11/20/2023 CLINICAL DATA:  Acute respiratory failure. EXAM: PORTABLE CHEST 1 VIEW COMPARISON:  11/19/2023 FINDINGS: Mild cardiac enlargement, stable. Bilateral posterior layering pleural effusions with veil like opacification over the lower lung zones unchanged. Overlying areas of atelectasis. Mild increase interstitial markings compatible with pulmonary edema. IMPRESSION: 1. No change in appearance of the chest compared with the prior study. 2. Cardiac enlargement with persistent pleural effusions, bibasilar atelectasis and mild interstitial edema. Electronically Signed   By: Signa Kell M.D.   On: 11/20/2023 08:47   US Venous Img Lower Bilateral (DVT) Result Date: 11/19/2023 CLINICAL DATA:  Bilateral lower extremity edema worse on the right than the left EXAM: BILATERAL LOWER EXTREMITY VENOUS DOPPLER ULTRASOUND TECHNIQUE: Gray-scale sonography with graded compression, as well as color  Doppler and duplex ultrasound were performed to evaluate the lower extremity deep venous systems from the level of the common femoral vein and including the common femoral, femoral, profunda femoral, popliteal and calf veins including the posterior tibial, peroneal and gastrocnemius veins when visible. The superficial great saphenous vein was also interrogated. Spectral Doppler was utilized to evaluate flow at rest and with distal augmentation maneuvers in the common femoral, femoral and popliteal veins. COMPARISON:  None Available. FINDINGS: RIGHT LOWER EXTREMITY Common Femoral Vein: No evidence of thrombus. Normal compressibility, respiratory phasicity and response to augmentation. Saphenofemoral Junction: No evidence of thrombus. Normal compressibility and flow on color Doppler imaging. Profunda Femoral Vein: No evidence of thrombus. Normal compressibility and flow on color Doppler imaging. Femoral Vein: No evidence of thrombus. Normal compressibility, respiratory phasicity and response to augmentation. Popliteal Vein: No evidence of thrombus. Normal compressibility, respiratory phasicity and response to augmentation. Calf Veins: No evidence of thrombus. Normal compressibility and flow on color Doppler imaging. Superficial Great Saphenous Vein: No evidence of thrombus. Normal compressibility. Venous Reflux:  None. Other Findings:  None. LEFT LOWER EXTREMITY Common Femoral Vein: No evidence of thrombus. Normal compressibility, respiratory phasicity and response to augmentation. Saphenofemoral Junction: No evidence of thrombus. Normal compressibility and flow on color Doppler imaging. Profunda Femoral Vein: No evidence of thrombus. Normal compressibility and flow on color Doppler imaging. Femoral Vein: No evidence of thrombus. Normal compressibility, respiratory phasicity and response to augmentation. Popliteal Vein: No evidence of thrombus. Normal compressibility, respiratory phasicity and response to  augmentation. Calf Veins: No evidence of thrombus. Normal compressibility and flow on color Doppler imaging. Superficial Great Saphenous Vein: No evidence of thrombus. Normal compressibility. Venous Reflux:  None. Other Findings:  None. IMPRESSION: No evidence of deep venous thrombosis in either lower extremity. Electronically Signed   By: Malachy Moan M.D.   On: 11/19/2023 08:54   CT CHEST ABDOMEN PELVIS W CONTRAST Result Date: 11/19/2023 CLINICAL DATA:  Sepsis.  Shortness of breath.  Recent pneumonia. EXAM: CT CHEST, ABDOMEN, AND PELVIS WITH CONTRAST TECHNIQUE: Multidetector CT imaging of the chest, abdomen and pelvis was performed following the standard protocol during bolus administration of intravenous contrast. RADIATION DOSE REDUCTION: This exam was performed according to the departmental dose-optimization program which includes automated exposure control, adjustment of the mA and/or kV according to patient size and/or use of iterative reconstruction technique. CONTRAST:  OMNIPAQUE IOHEXOL 300 MG/ML  SOLN COMPARISON:  06/22/2023.  Chest x-ray today. FINDINGS: CT CHEST FINDINGS Cardiovascular: Heart borderline in size. Diffuse coronary artery and moderate aortic atherosclerosis. No evidence of aortic aneurysm or dissection. No filling defects in the pulmonary arteries to suggest pulmonary emboli. Mediastinum/Nodes: No mediastinal, hilar, or axillary adenopathy. Trachea and esophagus are unremarkable. Thyroid unremarkable. Lungs/Pleura: Small to moderate bilateral pleural effusions. 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 Airspace opacities in the lower lobes. Scattered ground-glass opacities in the lungs could reflect atelectasis or early edema. Musculoskeletal: Chest wall soft tissues are unremarkable. No acute bony abnormality. CT ABDOMEN PELVIS FINDINGS Hepatobiliary: No focal liver abnormality is seen. Status post cholecystectomy. No biliary dilatation. Pancreas: No focal  abnormality or ductal dilatation. Spleen: No focal abnormality.  Normal size. Adrenals/Urinary Tract: Bilateral perinephric stranding, stable since prior study. No suspicious renal or adrenal lesion. No stones or hydronephrosis. Urinary bladder unremarkable. Stomach/Bowel: Normal appendix. Stomach, large and small bowel grossly unremarkable. Vascular/Lymphatic: Aortic atherosclerosis. No evidence of aneurysm or adenopathy. Reproductive: Prostate enlargement Other: No free fluid or free air. Musculoskeletal: No acute bony abnormality. IMPRESSION: Small to moderate bilateral pleural effusions. Bilateral lower lobe airspace opacities could reflect atelectasis or pneumonia. Diffuse coronary artery disease.  Aortic atherosclerosis. Borderline heart size. Scattered ground-glass opacities in the lungs could reflect atelectasis or early edema. No acute findings in the abdomen or pelvis. Electronically Signed   By: Charlett Nose M.D.   On: 11/19/2023 00:18   DG Chest Port 1 View Result Date: 11/18/2023 CLINICAL DATA:  SHOB, hx of PNA EXAM: PORTABLE CHEST 1 VIEW COMPARISON:  Chest x-ray 06/22/2023 FINDINGS: The heart and mediastinal contours are unchanged. Atherosclerotic plaque. Prominent hilar vasculature. Question left base atelectasis. Increased interstitial markings. No pleural effusion. No pneumothorax. No acute osseous abnormality. IMPRESSION: 1. Pulmonary edema. 2. Question left base atelectasis. Recommend further evaluation with PA and lateral view of the chest. 3.  Aortic Atherosclerosis (ICD10-I70.0). Electronically Signed   By: Tish Frederickson M.D.   On: 11/18/2023 22:25    Microbiology: Results for orders placed or performed during the hospital encounter of 11/18/23  Resp panel by RT-PCR (RSV, Flu A&B, Covid) Anterior Nasal Swab     Status: None   Collection Time: 11/18/23  9:43 PM   Specimen: Anterior Nasal Swab  Result Value Ref Range Status   SARS Coronavirus 2 by RT PCR NEGATIVE NEGATIVE Final     Comment: (NOTE) SARS-CoV-2 target nucleic acids are NOT DETECTED.  The SARS-CoV-2 RNA is generally detectable in upper respiratory specimens during the acute phase of infection. The lowest concentration of SARS-CoV-2 viral copies this assay can detect  is 138 copies/mL. A negative result does not preclude SARS-Cov-2 infection and should not be used as the sole basis for treatment or other patient management decisions. A negative result may occur with  improper specimen collection/handling, submission of specimen other than nasopharyngeal swab, presence of viral mutation(s) within the areas targeted by this assay, and inadequate number of viral copies(<138 copies/mL). A negative result must be combined with clinical observations, patient history, and epidemiological information. The expected result is Negative.  Fact Sheet for Patients:  BloggerCourse.com  Fact Sheet for Healthcare Providers:  SeriousBroker.it  This test is no t yet approved or cleared by the [place] FDA and  has been authorized for detection and/or diagnosis of SARS-CoV-2 by FDA under an Emergency Use Authorization (EUA). This EUA will remain  in effect (meaning this test can be used) for the duration of the COVID-19 declaration under Section 564(b)(1) of the Act, 21 U.S.C.section 360bbb-3(b)(1), unless the authorization is terminated  or revoked sooner.       Influenza A by PCR NEGATIVE NEGATIVE Final   Influenza B by PCR NEGATIVE NEGATIVE Final    Comment: (NOTE) The Xpert Xpress SARS-CoV-2/FLU/RSV plus assay is intended as an aid in the diagnosis of influenza from Nasopharyngeal swab specimens and should not be used as a sole basis for treatment. Nasal washings and aspirates are unacceptable for Xpert Xpress SARS-CoV-2/FLU/RSV testing.  Fact Sheet for Patients: BloggerCourse.com  Fact Sheet for Healthcare  Providers: SeriousBroker.it  This test is not yet approved or cleared by the [place] FDA and has been authorized for detection and/or diagnosis of SARS-CoV-2 by FDA under an Emergency Use Authorization (EUA). This EUA will remain in effect (meaning this test can be used) for the duration of the COVID-19 declaration under Section 564(b)(1) of the Act, 21 U.S.C. section 360bbb-3(b)(1), unless the authorization is terminated or revoked.     Resp Syncytial Virus by PCR NEGATIVE NEGATIVE Final    Comment: (NOTE) Fact Sheet for Patients: BloggerCourse.com  Fact Sheet for Healthcare Providers: SeriousBroker.it  This test is not yet approved or cleared by the [place] FDA and has been authorized for detection and/or diagnosis of SARS-CoV-2 by FDA under an Emergency Use Authorization (EUA). This EUA will remain in effect (meaning this test can be used) for the duration of the COVID-19 declaration under Section 564(b)(1) of the Act, 21 U.S.C. section 360bbb-3(b)(1), unless the authorization is terminated or revoked.  Performed at [facility], [address] [place], [place] [address]   Blood Culture (routine x 2)     Status: None   Collection Time: 11/18/23 10:25 PM   Specimen: BLOOD  Result Value Ref Range Status   Specimen Description BLOOD LFA  Final   Special Requests   Final    BOTTLES DRAWN AEROBIC AND ANAEROBIC Blood Culture results may not be optimal due to an inadequate volume of blood received in culture bottles   Culture   Final    NO GROWTH 5 DAYS Performed at [facility], [address] [place], [place] [address]    Report Status 11/23/2023 FINAL  Final  Blood Culture (routine x 2)     Status: None   Collection Time: 11/18/23 10:26 PM   Specimen: BLOOD  Result Value Ref Range Status   Specimen Description BLOOD RW  Final   Special Requests   Final    BOTTLES  DRAWN AEROBIC AND ANAEROBIC Blood Culture results may not be optimal due to an inadequate volume of  blood received in culture bottles   Culture   Final    NO GROWTH 5 DAYS Performed at [facility], [address] [place], [place] [address]    Report Status 11/23/2023 FINAL  Final  MRSA Next Gen by PCR, Nasal     Status: None   Collection Time: 11/19/23  2:57 AM   Specimen: Nasal Mucosa; Nasal Swab  Result Value Ref Range Status   MRSA by PCR Next Gen NOT DETECTED NOT DETECTED Final    Comment: (NOTE) The GeneXpert MRSA Assay (FDA approved for NASAL specimens only), is one component of a comprehensive MRSA colonization surveillance program. It is not intended to diagnose MRSA infection nor to guide or monitor treatment for MRSA infections. Test performance is not FDA approved in patients less than 14 years old. Performed at [facility], [address] [place], [place] [address]   Respiratory (~20 pathogens) panel by PCR     Status: None   Collection Time: 11/19/23  3:11 PM   Specimen: Nasopharyngeal Swab; Respiratory  Result Value Ref Range Status   Adenovirus NOT DETECTED NOT DETECTED Final   Coronavirus 229E NOT DETECTED NOT DETECTED Final    Comment: (NOTE) The Coronavirus on the Respiratory Panel, DOES NOT test for the novel  Coronavirus (2019 nCoV)    Coronavirus HKU1 NOT DETECTED NOT DETECTED Final   Coronavirus NL63 NOT DETECTED NOT DETECTED Final   Coronavirus OC43 NOT DETECTED NOT DETECTED Final   Metapneumovirus NOT DETECTED NOT DETECTED Final   Rhinovirus / Enterovirus NOT DETECTED NOT DETECTED Final   Influenza A NOT DETECTED NOT DETECTED Final   Influenza B NOT DETECTED NOT DETECTED Final   Parainfluenza Virus 1 NOT DETECTED NOT DETECTED Final   Parainfluenza Virus 2 NOT DETECTED NOT DETECTED Final   Parainfluenza Virus 3 NOT DETECTED NOT DETECTED Final   Parainfluenza Virus 4 NOT DETECTED NOT DETECTED Final   Respiratory Syncytial Virus  NOT DETECTED NOT DETECTED Final   Bordetella pertussis NOT DETECTED NOT DETECTED Final   Bordetella Parapertussis NOT DETECTED NOT DETECTED Final   Chlamydophila pneumoniae NOT DETECTED NOT DETECTED Final   Mycoplasma pneumoniae NOT DETECTED NOT DETECTED Final    Comment: Performed at [facility], [address], [place], [place] [address]    Labs: CBC: Recent Labs  Lab 12/05/23 1951 12/06/23 0428 12/06/23 1634 12/07/23 0135 12/08/23 0927  WBC 7.7 5.6 5.7 6.6 7.3  NEUTROABS  --   --   --  4.2 4.9  HGB 7.7* 7.0* 7.7* 7.6* 8.9*  HCT 24.5* 22.2* 23.8* 23.7* 27.4*  MCV 102.1* 100.0 98.8 96.7 101.9*  PLT 260 240 228 229 243   Basic Metabolic Panel: Recent Labs  Lab 12/05/23 1951 12/07/23 0135  NA 135 132*  K 4.8 4.6  CL 104 101  CO2 24 24  GLUCOSE 198* 198*  BUN 35* 35*  CREATININE 0.95 1.00  CALCIUM 8.0* 7.5*   Liver Function Tests: Recent Labs  Lab 12/05/23 1951  AST 28  ALT 19  ALKPHOS 95  BILITOT 1.1  PROT 6.2*  ALBUMIN 2.9*   CBG: Recent Labs  Lab 12/07/23 2358 12/08/23 0409 12/08/23 0450 12/08/23 0543 12/08/23 0755  GLUCAP 97 70 65* 91 123*    Discharge time spent:  35 minutes.  Signed: Loyce Dys, MD [facility] 12/08/2023

## 2023-12-09 ENCOUNTER — Telehealth: Payer: Self-pay

## 2023-12-09 NOTE — Telephone Encounter (Signed)
 Called pt's daughter to schedule a 3 week hospital discharge follow up to discuss a Cystoscopy with Dr.Sninsky, Per Sam. Pt's wife answered and stated they didn't need this appt and to call the daughter. Pt's daughter states "there is no need for this appt. He doesn't have the catheter in anymore, and they will call us if they need [place]". Pt's daughter was informed that this appt is to discuss a cystoscopy, where the doctor will look into the patient's bladder to make sure everything looks okay. Pt's daughter refused appt and states she will call us if they need [place]. Sam notified.

## 2023-12-13 ENCOUNTER — Inpatient Hospital Stay (HOSPITAL_COMMUNITY)
Admission: EM | Admit: 2023-12-13 | Discharge: 2023-12-21 | DRG: 377 | Disposition: A | Discharge status: Skilled Nursing Facility | Attending: Internal Medicine | Admitting: Internal Medicine

## 2023-12-13 ENCOUNTER — Encounter (HOSPITAL_COMMUNITY): Payer: Self-pay

## 2023-12-13 ENCOUNTER — Emergency Department (HOSPITAL_COMMUNITY)

## 2023-12-13 ENCOUNTER — Other Ambulatory Visit: Payer: Self-pay

## 2023-12-13 DIAGNOSIS — E785 Hyperlipidemia, unspecified: Secondary | ICD-10-CM | POA: Diagnosis present

## 2023-12-13 DIAGNOSIS — Z955 Presence of coronary angioplasty implant and graft: Secondary | ICD-10-CM

## 2023-12-13 DIAGNOSIS — Z8673 Personal history of transient ischemic attack (TIA), and cerebral infarction without residual deficits: Secondary | ICD-10-CM

## 2023-12-13 DIAGNOSIS — I480 Paroxysmal atrial fibrillation: Secondary | ICD-10-CM | POA: Diagnosis present

## 2023-12-13 DIAGNOSIS — Z9861 Coronary angioplasty status: Secondary | ICD-10-CM | POA: Diagnosis not present

## 2023-12-13 DIAGNOSIS — R55 Syncope and collapse: Secondary | ICD-10-CM | POA: Diagnosis not present

## 2023-12-13 DIAGNOSIS — K921 Melena: Secondary | ICD-10-CM | POA: Diagnosis not present

## 2023-12-13 DIAGNOSIS — I110 Hypertensive heart disease with heart failure: Secondary | ICD-10-CM | POA: Diagnosis present

## 2023-12-13 DIAGNOSIS — N400 Enlarged prostate without lower urinary tract symptoms: Secondary | ICD-10-CM | POA: Diagnosis not present

## 2023-12-13 DIAGNOSIS — D649 Anemia, unspecified: Principal | ICD-10-CM | POA: Diagnosis present

## 2023-12-13 DIAGNOSIS — I2489 Other forms of acute ischemic heart disease: Secondary | ICD-10-CM | POA: Insufficient documentation

## 2023-12-13 DIAGNOSIS — R7989 Other specified abnormal findings of blood chemistry: Secondary | ICD-10-CM | POA: Insufficient documentation

## 2023-12-13 DIAGNOSIS — I2510 Atherosclerotic heart disease of native coronary artery without angina pectoris: Secondary | ICD-10-CM | POA: Diagnosis present

## 2023-12-13 DIAGNOSIS — D6832 Hemorrhagic disorder due to extrinsic circulating anticoagulants: Secondary | ICD-10-CM | POA: Diagnosis present

## 2023-12-13 DIAGNOSIS — Z7901 Long term (current) use of anticoagulants: Secondary | ICD-10-CM | POA: Diagnosis not present

## 2023-12-13 DIAGNOSIS — I255 Ischemic cardiomyopathy: Secondary | ICD-10-CM | POA: Diagnosis present

## 2023-12-13 DIAGNOSIS — I444 Left anterior fascicular block: Secondary | ICD-10-CM | POA: Diagnosis present

## 2023-12-13 DIAGNOSIS — Z8505 Personal history of malignant neoplasm of liver: Secondary | ICD-10-CM

## 2023-12-13 DIAGNOSIS — I350 Nonrheumatic aortic (valve) stenosis: Secondary | ICD-10-CM | POA: Diagnosis present

## 2023-12-13 DIAGNOSIS — E162 Hypoglycemia, unspecified: Secondary | ICD-10-CM

## 2023-12-13 DIAGNOSIS — T8383XA Hemorrhage of genitourinary prosthetic devices, implants and grafts, initial encounter: Secondary | ICD-10-CM | POA: Diagnosis not present

## 2023-12-13 DIAGNOSIS — Z833 Family history of diabetes mellitus: Secondary | ICD-10-CM

## 2023-12-13 DIAGNOSIS — Z87891 Personal history of nicotine dependence: Secondary | ICD-10-CM

## 2023-12-13 DIAGNOSIS — Z794 Long term (current) use of insulin: Secondary | ICD-10-CM | POA: Diagnosis not present

## 2023-12-13 DIAGNOSIS — Z7189 Other specified counseling: Secondary | ICD-10-CM

## 2023-12-13 DIAGNOSIS — K2971 Gastritis, unspecified, with bleeding: Secondary | ICD-10-CM | POA: Diagnosis present

## 2023-12-13 DIAGNOSIS — Z79899 Other long term (current) drug therapy: Secondary | ICD-10-CM

## 2023-12-13 DIAGNOSIS — R339 Retention of urine, unspecified: Secondary | ICD-10-CM | POA: Diagnosis present

## 2023-12-13 DIAGNOSIS — E1165 Type 2 diabetes mellitus with hyperglycemia: Secondary | ICD-10-CM | POA: Diagnosis present

## 2023-12-13 DIAGNOSIS — Z7982 Long term (current) use of aspirin: Secondary | ICD-10-CM

## 2023-12-13 DIAGNOSIS — Z7902 Long term (current) use of antithrombotics/antiplatelets: Secondary | ICD-10-CM | POA: Diagnosis not present

## 2023-12-13 DIAGNOSIS — D62 Acute posthemorrhagic anemia: Secondary | ICD-10-CM | POA: Diagnosis present

## 2023-12-13 DIAGNOSIS — E66811 Obesity, class 1: Secondary | ICD-10-CM | POA: Diagnosis present

## 2023-12-13 DIAGNOSIS — R310 Gross hematuria: Secondary | ICD-10-CM | POA: Diagnosis not present

## 2023-12-13 DIAGNOSIS — N401 Enlarged prostate with lower urinary tract symptoms: Secondary | ICD-10-CM | POA: Diagnosis present

## 2023-12-13 DIAGNOSIS — I5020 Unspecified systolic (congestive) heart failure: Secondary | ICD-10-CM | POA: Insufficient documentation

## 2023-12-13 DIAGNOSIS — D126 Benign neoplasm of colon, unspecified: Secondary | ICD-10-CM | POA: Insufficient documentation

## 2023-12-13 DIAGNOSIS — E1169 Type 2 diabetes mellitus with other specified complication: Secondary | ICD-10-CM | POA: Diagnosis present

## 2023-12-13 DIAGNOSIS — K31A19 Gastric intestinal metaplasia without dysplasia, unspecified site: Secondary | ICD-10-CM | POA: Diagnosis not present

## 2023-12-13 DIAGNOSIS — Z789 Other specified health status: Secondary | ICD-10-CM | POA: Diagnosis not present

## 2023-12-13 DIAGNOSIS — Y658 Other specified misadventures during surgical and medical care: Secondary | ICD-10-CM | POA: Diagnosis present

## 2023-12-13 DIAGNOSIS — K449 Diaphragmatic hernia without obstruction or gangrene: Secondary | ICD-10-CM | POA: Diagnosis present

## 2023-12-13 DIAGNOSIS — I4819 Other persistent atrial fibrillation: Secondary | ICD-10-CM | POA: Diagnosis present

## 2023-12-13 DIAGNOSIS — I5023 Acute on chronic systolic (congestive) heart failure: Secondary | ICD-10-CM | POA: Diagnosis present

## 2023-12-13 DIAGNOSIS — Z7984 Long term (current) use of oral hypoglycemic drugs: Secondary | ICD-10-CM

## 2023-12-13 DIAGNOSIS — E11649 Type 2 diabetes mellitus with hypoglycemia without coma: Secondary | ICD-10-CM | POA: Diagnosis not present

## 2023-12-13 DIAGNOSIS — I951 Orthostatic hypotension: Secondary | ICD-10-CM | POA: Diagnosis not present

## 2023-12-13 DIAGNOSIS — Z515 Encounter for palliative care: Secondary | ICD-10-CM

## 2023-12-13 DIAGNOSIS — Z66 Do not resuscitate: Secondary | ICD-10-CM | POA: Diagnosis not present

## 2023-12-13 DIAGNOSIS — Z95828 Presence of other vascular implants and grafts: Secondary | ICD-10-CM | POA: Diagnosis not present

## 2023-12-13 DIAGNOSIS — K219 Gastro-esophageal reflux disease without esophagitis: Secondary | ICD-10-CM | POA: Diagnosis not present

## 2023-12-13 DIAGNOSIS — J449 Chronic obstructive pulmonary disease, unspecified: Secondary | ICD-10-CM | POA: Diagnosis present

## 2023-12-13 DIAGNOSIS — K922 Gastrointestinal hemorrhage, unspecified: Secondary | ICD-10-CM | POA: Diagnosis present

## 2023-12-13 DIAGNOSIS — I4891 Unspecified atrial fibrillation: Secondary | ICD-10-CM | POA: Insufficient documentation

## 2023-12-13 DIAGNOSIS — Z860101 Personal history of adenomatous and serrated colon polyps: Secondary | ICD-10-CM

## 2023-12-13 DIAGNOSIS — I252 Old myocardial infarction: Secondary | ICD-10-CM

## 2023-12-13 DIAGNOSIS — K3189 Other diseases of stomach and duodenum: Secondary | ICD-10-CM | POA: Diagnosis not present

## 2023-12-13 DIAGNOSIS — K222 Esophageal obstruction: Secondary | ICD-10-CM | POA: Diagnosis not present

## 2023-12-13 DIAGNOSIS — D500 Iron deficiency anemia secondary to blood loss (chronic): Secondary | ICD-10-CM

## 2023-12-13 DIAGNOSIS — K2950 Unspecified chronic gastritis without bleeding: Secondary | ICD-10-CM | POA: Diagnosis not present

## 2023-12-13 DIAGNOSIS — Z6831 Body mass index (BMI) 31.0-31.9, adult: Secondary | ICD-10-CM

## 2023-12-13 DIAGNOSIS — R296 Repeated falls: Secondary | ICD-10-CM | POA: Diagnosis present

## 2023-12-13 LAB — CBC WITH DIFFERENTIAL/PLATELET
Abs Immature Granulocytes: 0 10*3/uL (ref 0.00–0.07)
Basophils Absolute: 0 10*3/uL (ref 0.0–0.1)
Basophils Relative: 0 %
Eosinophils Absolute: 0.1 10*3/uL (ref 0.0–0.5)
Eosinophils Relative: 1 %
HCT: 23.1 % — ABNORMAL LOW (ref 39.0–52.0)
Hemoglobin: 7 g/dL — ABNORMAL LOW (ref 13.0–17.0)
Lymphocytes Relative: 7 %
Lymphs Abs: 0.4 10*3/uL — ABNORMAL LOW (ref 0.7–4.0)
MCH: 32.4 pg (ref 26.0–34.0)
MCHC: 30.3 g/dL (ref 30.0–36.0)
MCV: 106.9 fL — ABNORMAL HIGH (ref 80.0–100.0)
Monocytes Absolute: 0.3 10*3/uL (ref 0.1–1.0)
Monocytes Relative: 5 %
Neutro Abs: 5.2 10*3/uL (ref 1.7–7.7)
Neutrophils Relative %: 87 %
Platelets: 220 10*3/uL (ref 150–400)
RBC: 2.16 MIL/uL — ABNORMAL LOW (ref 4.22–5.81)
RDW: 22 % — ABNORMAL HIGH (ref 11.5–15.5)
WBC: 6 10*3/uL (ref 4.0–10.5)
nRBC: 0 % (ref 0.0–0.2)
nRBC: 0 /100{WBCs}

## 2023-12-13 LAB — CBG MONITORING, ED
Glucose-Capillary: 43 mg/dL — CL (ref 70–99)
Glucose-Capillary: 95 mg/dL (ref 70–99)
Glucose-Capillary: 130 mg/dL — ABNORMAL HIGH (ref 70–99)

## 2023-12-13 LAB — TROPONIN I (HIGH SENSITIVITY)
Troponin I (High Sensitivity): 520 ng/L (ref <18)
Troponin I (High Sensitivity): 497 ng/L (ref <18)

## 2023-12-13 LAB — COMPREHENSIVE METABOLIC PANEL WITH GFR
ALT: 21 U/L (ref 0–44)
AST: 31 U/L (ref 15–41)
Albumin: 2.7 g/dL — ABNORMAL LOW (ref 3.5–5.0)
Alkaline Phosphatase: 56 U/L (ref 38–126)
Anion gap: 8 (ref 5–15)
BUN: 29 mg/dL — ABNORMAL HIGH (ref 8–23)
CO2: 24 mmol/L (ref 22–32)
Calcium: 7.9 mg/dL — ABNORMAL LOW (ref 8.9–10.3)
Chloride: 107 mmol/L (ref 98–111)
Creatinine, Ser: 1 mg/dL (ref 0.61–1.24)
GFR, Estimated: >60 mL/min (ref >60)
Glucose, Bld: 62 mg/dL — ABNORMAL LOW (ref 70–99)
Potassium: 3.9 mmol/L (ref 3.5–5.1)
Sodium: 139 mmol/L (ref 135–145)
Total Bilirubin: 1 mg/dL (ref 0.0–1.2)
Total Protein: 5.3 g/dL — ABNORMAL LOW (ref 6.5–8.1)

## 2023-12-13 LAB — BRAIN NATRIURETIC PEPTIDE: B Natriuretic Peptide: 540.4 pg/mL — ABNORMAL HIGH (ref 0.0–100.0)

## 2023-12-13 LAB — GLUCOSE, CAPILLARY: Glucose-Capillary: 178 mg/dL — ABNORMAL HIGH (ref 70–99)

## 2023-12-13 LAB — PREPARE RBC (CROSSMATCH)

## 2023-12-13 LAB — POC OCCULT BLOOD, ED: Fecal Occult Bld: POSITIVE — AB

## 2023-12-13 MED ORDER — ACETAMINOPHEN 650 MG RE SUPP: 650.0000 mg | Freq: Four times a day (QID) | RECTAL | Status: DC | PRN

## 2023-12-13 MED ORDER — FUROSEMIDE 10 MG/ML IJ SOLN
20.0000 mg | Freq: Every day | INTRAMUSCULAR | Status: DC
  Administered 2023-12-14 – 2023-12-15 (×2): 20 mg via INTRAVENOUS
  Filled 2023-12-13 (×2): qty 2

## 2023-12-13 MED ORDER — DEXTROSE 50 % IV SOLN: 1.0000 | INTRAVENOUS | Status: DC | PRN

## 2023-12-13 MED ORDER — INSULIN ASPART 100 UNIT/ML IJ SOLN
0.0000 [IU] | Freq: Three times a day (TID) | INTRAMUSCULAR | Status: DC
  Administered 2023-12-14 (×2): 3 [IU] via SUBCUTANEOUS
  Administered 2023-12-15: 2 [IU] via SUBCUTANEOUS
  Administered 2023-12-15 – 2023-12-16 (×2): 5 [IU] via SUBCUTANEOUS
  Administered 2023-12-16: 3 [IU] via SUBCUTANEOUS
  Administered 2023-12-17: 8 [IU] via SUBCUTANEOUS
  Administered 2023-12-17: 3 [IU] via SUBCUTANEOUS
  Administered 2023-12-17: 5 [IU] via SUBCUTANEOUS
  Administered 2023-12-18: 3 [IU] via SUBCUTANEOUS
  Administered 2023-12-18: 15 [IU] via SUBCUTANEOUS
  Administered 2023-12-19 (×2): 3 [IU] via SUBCUTANEOUS
  Administered 2023-12-19 – 2023-12-20 (×2): 8 [IU] via SUBCUTANEOUS
  Administered 2023-12-20: 3 [IU] via SUBCUTANEOUS
  Administered 2023-12-21: 5 [IU] via SUBCUTANEOUS
  Administered 2023-12-21: 8 [IU] via SUBCUTANEOUS

## 2023-12-13 MED ORDER — ALBUTEROL SULFATE (2.5 MG/3ML) 0.083% IN NEBU: 2.5000 mg | INHALATION_SOLUTION | Freq: Four times a day (QID) | RESPIRATORY_TRACT | Status: DC | PRN

## 2023-12-13 MED ORDER — TAMSULOSIN HCL 0.4 MG PO CAPS
0.4000 mg | ORAL_CAPSULE | Freq: Every day | ORAL | Status: DC
  Administered 2023-12-13 – 2023-12-20 (×8): 0.4 mg via ORAL
  Filled 2023-12-13 (×8): qty 1

## 2023-12-13 MED ORDER — SODIUM CHLORIDE 0.9% FLUSH
3.0000 mL | Freq: Two times a day (BID) | INTRAVENOUS | Status: DC
  Administered 2023-12-13 – 2023-12-21 (×17): 3 mL via INTRAVENOUS

## 2023-12-13 MED ORDER — PANTOPRAZOLE SODIUM 40 MG PO TBEC
40.0000 mg | DELAYED_RELEASE_TABLET | Freq: Two times a day (BID) | ORAL | Status: DC
Start: 2023-12-13 — End: 2023-12-21
  Administered 2023-12-13 – 2023-12-21 (×16): 40 mg via ORAL
  Filled 2023-12-13 (×12): qty 1
  Filled 2023-12-13: qty 2
  Filled 2023-12-13 (×3): qty 1

## 2023-12-13 MED ORDER — ACETAMINOPHEN 325 MG PO TABS: 650.0000 mg | ORAL_TABLET | Freq: Four times a day (QID) | ORAL | Status: DC | PRN

## 2023-12-13 MED ORDER — DEXTROSE 50 % IV SOLN: 1.0000 | Freq: Once | INTRAVENOUS | Status: DC

## 2023-12-13 MED ORDER — PANTOPRAZOLE SODIUM 40 MG IV SOLR
80.0000 mg | Freq: Once | INTRAVENOUS | Status: AC
  Administered 2023-12-13: 80 mg via INTRAVENOUS
  Filled 2023-12-13: qty 20

## 2023-12-13 MED ORDER — ASPIRIN 81 MG PO TBEC
81.0000 mg | DELAYED_RELEASE_TABLET | Freq: Every day | ORAL | Status: DC
  Administered 2023-12-13 – 2023-12-21 (×9): 81 mg via ORAL
  Filled 2023-12-13 (×9): qty 1

## 2023-12-13 MED ORDER — SODIUM CHLORIDE 0.9% IV SOLUTION: Freq: Once | INTRAVENOUS | Status: AC

## 2023-12-13 MED ORDER — MIDODRINE HCL 5 MG PO TABS
5.0000 mg | ORAL_TABLET | Freq: Two times a day (BID) | ORAL | Status: DC
Start: 2023-12-13 — End: 2023-12-15
  Administered 2023-12-13 – 2023-12-15 (×4): 5 mg via ORAL
  Filled 2023-12-13 (×4): qty 1

## 2023-12-13 MED ORDER — CLOPIDOGREL BISULFATE 75 MG PO TABS
75.0000 mg | ORAL_TABLET | Freq: Every day | ORAL | Status: DC
  Administered 2023-12-13 – 2023-12-21 (×9): 75 mg via ORAL
  Filled 2023-12-13 (×9): qty 1

## 2023-12-13 MED ORDER — ATORVASTATIN CALCIUM 80 MG PO TABS
80.0000 mg | ORAL_TABLET | Freq: Every day | ORAL | Status: DC
  Administered 2023-12-13 – 2023-12-21 (×9): 80 mg via ORAL
  Filled 2023-12-13 (×9): qty 1

## 2023-12-13 MED ORDER — FUROSEMIDE 10 MG/ML IJ SOLN
40.0000 mg | Freq: Once | INTRAMUSCULAR | Status: AC
  Administered 2023-12-13: 40 mg via INTRAVENOUS
  Filled 2023-12-13: qty 4

## 2023-12-13 NOTE — ED Provider Notes (Signed)
 [place] [facility] AT [facility] Provider Note   CSN: [id number] Arrival date & time: 12/13/23  4098     History  Chief Complaint  Patient presents with   Shortness of Breath    Mathew Baker is a 83 y.o. male.  HPI 83 year old male presents with shortness of breath.  He has been in out of the hospital the past month and recently had stents.  Yesterday he felt more short of breath than typical and it has persisted today and so EMS was called.  He feels like he is carrying extra fluid "everywhere".  Shortness of breath does seem worse with lying flat.  He denies any chest pain.  He has a chronic cough but no new or worsening cough or fever.  EMS found him to be in A-fib but chart review shows this is a recurrent problem for him.  Patient reports that his right leg is chronically swollen compared to the left due to a prior stroke.  Patient states that he has fallen twice over the last couple days, the first time his walker had an issue but the second time he thinks he got lightheaded and passed out.  No injuries.  Home Medications Prior to Admission medications   Medication Sig Start Date End Date Taking? Authorizing Provider  Alcohol Swabs (ALCOHOL WIPES) 70 % PADS USE FOR INSULIN INJECTIONS OR TO FINGER STICK AS DIRECTED BY YOUR MEDICAL PROVIDER 04/28/20   [provider]  aspirin EC 81 MG tablet Take 1 tablet (81 mg total) by mouth daily. Swallow whole. 11/22/23   Alford Highland, MD  atorvastatin (LIPITOR) 80 MG tablet Take 80 mg by mouth daily.    [provider]  carvedilol (COREG) 3.125 MG tablet Take 1 tablet (3.125 mg total) by mouth 2 (two) times daily with a meal. 11/21/23   Alford Highland, MD  clopidogrel (PLAVIX) 75 MG tablet Take 75 mg by mouth daily. 12/03/23 12/02/24  [provider]  docusate sodium (COLACE) 100 MG capsule Take 100 mg by mouth 2 (two) times daily. 05/02/23   [provider]  empagliflozin  (JARDIANCE) 25 MG TABS tablet Take 12.5 mg by mouth daily. 12/09/20   [provider]  feeding supplement (ENSURE ENLIVE / ENSURE PLUS) LIQD Take 237 mLs by mouth 3 (three) times daily between meals. 05/24/23   Alford Highland, MD  glucose blood (PRECISION QID TEST) test strip Use to test blood sugar 2 times daily as instructed. Dx: E11.65 08/22/15   [provider]  HYDROcodone-acetaminophen (NORCO/VICODIN) 5-325 MG tablet Take 1 tablet by mouth every 8 (eight) hours as needed. Patient not taking: Reported on 12/06/2023 02/26/21   [provider]  insulin aspart (NOVOLOG) 100 UNIT/ML injection Inject 0-6 Units into the skin 3 (three) times daily as needed for high blood sugar. Patient taking differently: Inject 0-6 Units into the skin 3 (three) times daily as needed for high blood sugar (For Blood Sugar over 150). 05/24/23   Alford Highland, MD  insulin glargine (LANTUS) 100 UNIT/ML injection Inject 50 Units into the skin 2 (two) times daily. 12/02/23   [provider]  Insulin Pen Needle (PEN NEEDLES 29GX1/2") 29G X MISC Use 1x a day 08/21/14   [provider]  ipratropium-albuterol (DUONEB) 0.5-2.5 (3) MG/3ML SOLN Take 3 mLs by nebulization every 4 (four) hours. 11/21/23   Wieting, Richard, MD  latanoprost (XALATAN) 0.005 % ophthalmic solution Place 1 drop into both eyes at bedtime.  [provider]  midodrine (PROAMATINE) 5 MG tablet Take 1 tablet by mouth 2 (two) times daily. 12/02/23 12/01/24  [provider]  nitroGLYCERIN (NITROSTAT) 0.4 MG SL tablet Place 0.4 mg under the tongue every 5 (five) minutes as needed for chest pain. 12/02/23 12/01/24  [provider]  ondansetron (ZOFRAN) 4 MG/2ML SOLN injection Inject 2 mLs (4 mg total) into the vein every 6 (six) hours as needed for nausea or vomiting. 11/21/23   Alford Highland, MD  pantoprazole (PROTONIX) 40 MG tablet Take 1 tablet (40 mg total) by mouth daily. 05/25/23    Alford Highland, MD  polyethylene glycol (MIRALAX / GLYCOLAX) 17 g packet Take 17 g by mouth daily as needed for moderate constipation. 11/21/23   Alford Highland, MD  tamsulosin (FLOMAX) 0.4 MG CAPS capsule Take 0.4 mg by mouth at bedtime.    [provider]  vitamin B-12 (CYANOCOBALAMIN) 100 MCG tablet Take 100 mcg by mouth daily.    [provider]      Allergies    Patient has no known allergies.    Review of Systems   Review of Systems  Constitutional:  Negative for fever.  Respiratory:  Positive for shortness of breath.   Cardiovascular:  Positive for leg swelling. Negative for chest pain and palpitations.  Gastrointestinal:  Negative for abdominal pain.    Physical Exam Updated Vital Signs BP 110/71   Pulse (!) 32   Temp (!) 97.5 F (36.4 C) (Oral)   Resp (!) 21   Ht 6\' 1"  (1.854 m)   Wt 99.8 kg   SpO2 98%   BMI 29.03 kg/m  Physical Exam Vitals and nursing note reviewed.  Constitutional:      General: He is not in acute distress.    Appearance: He is well-developed. He is not ill-appearing or diaphoretic.  HENT:     Head: Normocephalic and atraumatic.  Cardiovascular:     Rate and Rhythm: Normal rate. Rhythm irregular.     Heart sounds: Murmur heard.  Pulmonary:     Effort: Pulmonary effort is normal. No accessory muscle usage.     Breath sounds: Normal breath sounds.  Abdominal:     Palpations: Abdomen is soft.     Tenderness: There is no abdominal tenderness.  Musculoskeletal:     Right lower leg: Edema present.     Left lower leg: Edema present.     Comments: Right lower extremity is symmetrically more swollen than the left.  Both have pitting edema.  Skin:    General: Skin is warm and dry.  Neurological:     Mental Status: He is alert.     ED Results / Procedures / Treatments   Labs (all labs ordered are listed, but only abnormal results are displayed) Labs Reviewed  COMPREHENSIVE METABOLIC PANEL WITH GFR - Abnormal; Notable  for the following components:      Result Value   Glucose, Bld 62 (*)    BUN 29 (*)    Calcium 7.9 (*)    Total Protein 5.3 (*)    Albumin 2.7 (*)    All other components within normal limits  BRAIN NATRIURETIC PEPTIDE - Abnormal; Notable for the following components:   B Natriuretic Peptide 540.4 (*)    All other components within normal limits  CBC WITH DIFFERENTIAL/PLATELET - Abnormal; Notable for the following components:   RBC 2.16 (*)    Hemoglobin 7.0 (*)    HCT 23.1 (*)    MCV 106.9 (*)  RDW 22.0 (*)    Lymphs Abs 0.4 (*)    All other components within normal limits  CBG MONITORING, ED - Abnormal; Notable for the following components:   Glucose-Capillary 43 (*)    All other components within normal limits  TROPONIN I (HIGH SENSITIVITY) - Abnormal; Notable for the following components:   Troponin I (High Sensitivity) 520 (*)    All other components within normal limits  POC OCCULT BLOOD, ED  CBG MONITORING, ED  CBG MONITORING, ED  PREPARE RBC (CROSSMATCH)  TYPE AND SCREEN  TROPONIN I (HIGH SENSITIVITY)    EKG EKG Interpretation Date/Time:  Tuesday December 13 2023 10:51:38 EDT Ventricular Rate:  81 PR Interval:    QRS Duration:  145 QT Interval:  405 QTC Calculation: 447 R Axis:   -50  Text Interpretation: Atrial fibrillation Left bundle branch block Confirmed by Pricilla Loveless [phone]) on 12/13/2023 10:59:48 AM  Radiology DG Chest 2 View Result Date: 12/13/2023 CLINICAL DATA:  dyspnea EXAM: CHEST - 2 VIEW COMPARISON:  11/20/2023 FINDINGS: New small to moderate right pleural effusion. New airspace disease at the right lung base. Some improved left retrocardiac aeration. Heart size and mediastinal contours are within normal limits. Aortic Atherosclerosis (ICD10-170.0). Visualized bones unremarkable. IMPRESSION: New small to moderate right pleural effusion and right basilar airspace disease. Electronically Signed   By: Corlis Leak M.D.   On: 12/13/2023 09:02     Procedures .Critical Care  Performed by: Pricilla Loveless, MD Authorized by: Pricilla Loveless, MD   Critical care provider statement:    Critical care time (minutes):  40   Critical care time was exclusive of:  Separately billable procedures and treating other patients   Critical care was necessary to treat or prevent imminent or life-threatening deterioration of the following conditions:  Shock, circulatory failure and cardiac failure   Critical care was time spent personally by me on the following activities:  Development of treatment plan with patient or surrogate, discussions with consultants, evaluation of patient's response to treatment, examination of patient, ordering and review of laboratory studies, ordering and review of radiographic studies, ordering and performing treatments and interventions, pulse oximetry, re-evaluation of patient's condition and review of old charts     Medications Ordered in ED Medications  0.9 %  sodium chloride infusion (Manually program via Guardrails IV Fluids) (has no administration in time range)  furosemide (LASIX) injection 40 mg (has no administration in time range)  pantoprazole (PROTONIX) injection 80 mg (has no administration in time range)    ED Course/ Medical Decision Making/ A&P                                 Medical Decision Making Amount and/or Complexity of Data Reviewed Independent Historian: EMS    Details: Daughter External Data Reviewed: notes. Labs: ordered.    Details: Hemoglobin 7, BNP elevated, Troponin elevated Radiology: ordered and independent interpretation performed.    Details: Right pleural effusion ECG/medicine tests: ordered and independent interpretation performed.    Details: Afib  Risk Prescription drug management. Decision regarding hospitalization.   Patient presents with shortness of breath.  History was added by the daughter after she arrived.  Patient has been at [facility] and then later [facility] for heart issues.  She is concerned because he is getting worse.  He is found to be in CHF here and also has worsening anemia.  His Hemoccult test is positive and  the daughter states the home health caregiver has noted black stools recently.  He will be treated with a unit of blood, Protonix, and diuresis.  Vital signs are stable and he is not hypoxic at this time.  I have consulted cardiology as well as gastroenterology to help manage the patient.  He has no active chest pain and with his troponin being elevated, will have cardiology see the patient but I do not think heparin is warranted at this moment as he is likely having some evidence of blood loss with worsening anemia.  I discussed case with Dr. Katrinka Blazing for admission.        Final Clinical Impression(s) / ED Diagnoses Final diagnoses:  Symptomatic anemia  Acute on chronic systolic (congestive) heart failure (HCC)  Hypoglycemia    Rx / DC Orders ED Discharge Orders     None         Pricilla Loveless, MD 12/13/23 1130

## 2023-12-13 NOTE — ED Notes (Signed)
 Pt transitioned to a hospital bed for comfort. Remains connected to monitor. Call light within reach. Pt placed on bedpan per request. Will call when done.

## 2023-12-13 NOTE — Consult Note (Addendum)
 Consultation  Referring Provider: [facility] Primary Care Physician:  Lauro Regulus, MD Primary Gastroenterologist:  unassigned  Reason for Consultation: Anemia, heme positive stool  HPI: Mathew Baker is a 83 y.o. male, who presented to the emergency room with primary complaint of shortness of breath.  Per ER notes patient expressed that he felt like he is fluid overloaded. Patient has history of COPD, prior CVA, osteoarthritis, hypertension, hyperlipidemia, diabetes mellitus.  He has had 2 recent hospitalizations, initially at [facility] regional on 11/18/2023 when he had acute hypoxic respiratory failure, and was determined to have an NSTEMI.  He was transferred to [facility] on 11/22/2023 where he underwent cardiac catheterization and PCI to LM and circumflex and required IABP support postprocedure.  He then had a staged PCI to the RCA on 11/28/2023. He had previously been on Eliquis which was continued, started Plavix, started on midodrine for hypotension, and was discharged home with a Foley in place due to urinary retention.  Echo done during that admission showed EF of 20 to 25% and severe aortic stenosis.   Patient's daughter relates that he fell at home within a week of discharge from the hospital and pulled the Foley out at that time at which time he had a lot of bleeding.  He actually had to go back to [facility] and was transfused 2 units of packed RBCs, and underwent voiding trials etc. Hemoglobin was 7.7 at the time of that admission down from 9.8 a few weeks previous  Patient's daughter says they have not yet been able to see cardiology as an outpatient in follow-up because he has been sick off and on since then.   Patient says he has not had any more urethral bleeding since then.  He has been noticing that he has been very constipated and he is calling his stools "dark and tarry".  He has not necessarily been having a bowel movement every day.  He denies any  abdominal pain, no heartburn or indigestion, no dysphagia, no vomiting but has had some nausea. He has been on a PPI recently at home He feels that his stools have been dark over the past 3 weeks at least.  His daughter says that he has fallen twice over the past couple of days and that he does not seem to have any ability to catch himself.  He fell again yesterday.  No syncope. He denies any chest pain today but continues to complain of shortness of breath. He has been taking medicines as directed including Plavix yesterday, Eliquis, and aspirin.  In the ER today chest x-ray shows new small to moderate right pleural effusion and right basilar airspace disease  He had CT of the abdomen pelvis done on 12/06/2023 with contrast after a fall-which showed 25 mm hypodense lesions in the left hepatic dome similar to previous but indeterminant, liver otherwise unremarkable status post cholecystectomy no ductal dilation, mild sigmoid diverticulosis stomach small bowel and large bowel unremarkable, extensive aortoiliac atherosclerotic calcifications, no PE  Labs today-WBC 6.0/hemoglobin 7.0/hematocrit 23.1/MCV 106 platelets 220 Troponin 520> 497 BNP 540 Sodium 139/potassium 3.9/BUN 29/creatinine 1.0 LFTs within normal limits  Currently being transfused 1 unit of packed RBCs. Patient has not had prior EGD,.  he had colonoscopy in 2015 with 2 tubular adenomas removed  Patient also has history of liver carcinoma for which she was treated with Y90 in October 2023 He apparently had not been having any symptoms at the time of this diagnosis.   Past Medical  History:  Diagnosis Date   Chronic airway obstruction (HCC)    Degeneration of intervertebral disc of lumbar region    Diabetes mellitus without complication (HCC)    Essential hypertension, benign    Hyperlipidemia    Hypertension    Osteoarthritis of lower extremity    Pneumonia, organism unspecified(486)    PSA elevation 07/16/2013   Normal    Stroke [facility])     Past Surgical History:  Procedure Laterality Date   CATARACT EXTRACTION W/ INTRAOCULAR LENS  IMPLANT, BILATERAL     COLONOSCOPY  2006   Normal: Repeat in 10 yrs   IR PERC CHOLECYSTOSTOMY  05/20/2023   IR RADIOLOGIST EVAL & MGMT  06/22/2023   LEFT HEART CATH N/A 11/21/2023   Procedure: Left Heart Cath;  Surgeon: Alwyn Pea, MD;  Location: [facility] INVASIVE CV LAB;  Service: Cardiovascular;  Laterality: N/A;  Monday 3/17   LOOP RECORDER INSERTION N/A 02/21/2020   Procedure: LOOP RECORDER INSERTION;  Surgeon: Marcina Millard, MD;  Location: [facility] INVASIVE CV LAB;  Service: Cardiovascular;  Laterality: N/A;   LOOP RECORDER REMOVAL N/A 10/23/2020   Procedure: LOOP RECORDER REMOVAL;  Surgeon: Marcina Millard, MD;  Location: [facility] INVASIVE CV LAB;  Service: Cardiovascular;  Laterality: N/A;   TEE WITHOUT CARDIOVERSION N/A 09/15/2020   Procedure: TRANSESOPHAGEAL ECHOCARDIOGRAM (TEE);  Surgeon: Dalia Heading, MD;  Location: [facility] ORS;  Service: Cardiovascular;  Laterality: N/A;    Prior to Admission medications   Medication Sig Start Date End Date Taking? Authorizing Provider  aspirin EC 81 MG tablet Take 1 tablet (81 mg total) by mouth daily. Swallow whole. 11/22/23  Yes Wieting, Richard, MD  atorvastatin (LIPITOR) 80 MG tablet Take 80 mg by mouth daily.   Yes [provider]  carvedilol (COREG) 3.125 MG tablet Take 1 tablet (3.125 mg total) by mouth 2 (two) times daily with a meal. 11/21/23  Yes Wieting, Richard, MD  clopidogrel (PLAVIX) 75 MG tablet Take 75 mg by mouth daily. 12/03/23 12/02/24 Yes [provider]  docusate sodium (COLACE) 100 MG capsule Take 100 mg by mouth 2 (two) times daily. 05/02/23  Yes [provider]  empagliflozin (JARDIANCE) 25 MG TABS tablet Take 12.5 mg by mouth daily. 12/09/20  Yes [provider]  feeding supplement (ENSURE ENLIVE / ENSURE PLUS) LIQD Take 237 mLs by mouth 3 (three) times daily between meals.  05/24/23  Yes Wieting, Richard, MD  insulin aspart (NOVOLOG) 100 UNIT/ML injection Inject 0-6 Units into the skin 3 (three) times daily as needed for high blood sugar. Patient taking differently: Inject 0-6 Units into the skin 3 (three) times daily as needed for high blood sugar (For Blood Sugar over 150). 05/24/23  Yes Wieting, Richard, MD  insulin glargine (LANTUS) 100 UNIT/ML injection Inject 50 Units into the skin 2 (two) times daily. 12/02/23  Yes [provider]  ipratropium-albuterol (DUONEB) 0.5-2.5 (3) MG/3ML SOLN Take 3 mLs by nebulization every 4 (four) hours. 11/21/23  Yes Wieting, Richard, MD  latanoprost (XALATAN) 0.005 % ophthalmic solution Place 1 drop into both eyes at bedtime.   Yes [provider]  midodrine (PROAMATINE) 5 MG tablet Take 1 tablet by mouth 2 (two) times daily. 12/02/23 12/01/24 Yes [provider]  nitroGLYCERIN (NITROSTAT) 0.4 MG SL tablet Place 0.4 mg under the tongue every 5 (five) minutes as needed for chest pain. 12/02/23 12/01/24 Yes [provider]  ondansetron (ZOFRAN) 4 MG/2ML SOLN injection Inject 2 mLs (4 mg total) into the vein every  6 (six) hours as needed for nausea or vomiting. 11/21/23  Yes Wieting, Richard, MD  pantoprazole (PROTONIX) 40 MG tablet Take 1 tablet (40 mg total) by mouth daily. 05/25/23  Yes Wieting, Richard, MD  polyethylene glycol (MIRALAX / GLYCOLAX) 17 g packet Take 17 g by mouth daily as needed for moderate constipation. 11/21/23  Yes Wieting, Richard, MD  tamsulosin (FLOMAX) 0.4 MG CAPS capsule Take 0.4 mg by mouth at bedtime.   Yes [provider]  vitamin B-12 (CYANOCOBALAMIN) 100 MCG tablet Take 100 mcg by mouth daily.   Yes [provider]  Alcohol Swabs (ALCOHOL WIPES) 70 % PADS USE FOR INSULIN INJECTIONS OR TO FINGER STICK AS DIRECTED BY YOUR MEDICAL PROVIDER 04/28/20   [provider]  glucose blood (PRECISION QID TEST) test strip Use to test blood sugar 2 times daily as  instructed. Dx: E11.65 08/22/15   [provider]  Insulin Pen Needle (PEN NEEDLES 29GX1/2") 29G X MISC Use 1x a day 08/21/14   [provider]    Current Facility-Administered Medications  Medication Dose Route Frequency Provider Last Rate Last Admin   acetaminophen (TYLENOL) tablet 650 mg  650 mg Oral Q6H PRN Clydie Braun, MD       Or   acetaminophen (TYLENOL) suppository 650 mg  650 mg Rectal Q6H PRN Madelyn Flavors A, MD       albuterol (PROVENTIL) (2.5 MG/3ML) 0.083% nebulizer solution 2.5 mg  2.5 mg Nebulization Q6H PRN Smith, Rondell A, MD       dextrose 50 % solution 50 mL  1 ampule Intravenous Once Smith, Rondell A, MD       dextrose 50 % solution 50 mL  1 ampule Intravenous PRN Smith, Rondell A, MD       sodium chloride flush (NS) 0.9 % injection 3 mL  3 mL Intravenous Q12H Smith, Rondell A, MD   3 mL at 12/13/23 1323   Current Outpatient Medications  Medication Sig Dispense Refill   aspirin EC 81 MG tablet Take 1 tablet (81 mg total) by mouth daily. Swallow whole.     atorvastatin (LIPITOR) 80 MG tablet Take 80 mg by mouth daily.     carvedilol (COREG) 3.125 MG tablet Take 1 tablet (3.125 mg total) by mouth 2 (two) times daily with a meal.     clopidogrel (PLAVIX) 75 MG tablet Take 75 mg by mouth daily.     docusate sodium (COLACE) 100 MG capsule Take 100 mg by mouth 2 (two) times daily.     empagliflozin (JARDIANCE) 25 MG TABS tablet Take 12.5 mg by mouth daily.     feeding supplement (ENSURE ENLIVE / ENSURE PLUS) LIQD Take 237 mLs by mouth 3 (three) times daily between meals. 21330 mL 0   insulin aspart (NOVOLOG) 100 UNIT/ML injection Inject 0-6 Units into the skin 3 (three) times daily as needed for high blood sugar. (Patient taking differently: Inject 0-6 Units into the skin 3 (three) times daily as needed for high blood sugar (For Blood Sugar over 150).)     insulin glargine (LANTUS) 100 UNIT/ML injection Inject 50 Units into the skin 2 (two) times  daily.     ipratropium-albuterol (DUONEB) 0.5-2.5 (3) MG/3ML SOLN Take 3 mLs by nebulization every 4 (four) hours.     latanoprost (XALATAN) 0.005 % ophthalmic solution Place 1 drop into both eyes at bedtime.     midodrine (PROAMATINE) 5 MG tablet Take 1 tablet by mouth 2 (two) times daily.  nitroGLYCERIN (NITROSTAT) 0.4 MG SL tablet Place 0.4 mg under the tongue every 5 (five) minutes as needed for chest pain.     ondansetron (ZOFRAN) 4 MG/2ML SOLN injection Inject 2 mLs (4 mg total) into the vein every 6 (six) hours as needed for nausea or vomiting.     pantoprazole (PROTONIX) 40 MG tablet Take 1 tablet (40 mg total) by mouth daily. 30 tablet 0   polyethylene glycol (MIRALAX / GLYCOLAX) 17 g packet Take 17 g by mouth daily as needed for moderate constipation.     tamsulosin (FLOMAX) 0.4 MG CAPS capsule Take 0.4 mg by mouth at bedtime.     vitamin B-12 (CYANOCOBALAMIN) 100 MCG tablet Take 100 mcg by mouth daily.     Alcohol Swabs (ALCOHOL WIPES) 70 % PADS USE FOR INSULIN INJECTIONS OR TO FINGER STICK AS DIRECTED BY YOUR MEDICAL PROVIDER     glucose blood (PRECISION QID TEST) test strip Use to test blood sugar 2 times daily as instructed. Dx: E11.65     Insulin Pen Needle (PEN NEEDLES 29GX1/2") 29G X MISC Use 1x a day      Allergies as of 12/13/2023   (No Known Allergies)    Family History  Problem Relation Age of Onset   Diabetes Mother     Social History   Socioeconomic History   Marital status: Married    Spouse name: Not on file   Number of children: Not on file   Years of education: Not on file   Highest education level: Not on file  Occupational History   Not on file  Tobacco Use   Smoking status: Former    Current packs/day: 0.00    Types: Cigarettes    Quit date: 09/06/1958    Years since quitting: 65.3   Smokeless tobacco: Never  Vaping Use   Vaping status: Never Used  Substance and Sexual Activity   Alcohol use: No    Alcohol/week: 0.0 standard drinks of  alcohol   Drug use: No   Sexual activity: Not on file  Other Topics Concern   Not on file  Social History Narrative   Married   Retired; 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; One of the original [employer] in the early 60's   1 daughter   Social Drivers of Corporate investment banker Strain: Low Risk  (11/26/2023)   Received from [facility]   Overall Financial Resource Strain (CARDIA)    Difficulty of Paying Living Expenses: Not hard at all  Food Insecurity: No Food Insecurity (12/06/2023)   Hunger Vital Sign    Worried About Running Out of Food in the Last Year: Never true    Ran Out of Food in the Last Year: Never true  Transportation Needs: No Transportation Needs (12/06/2023)   PRAPARE - Administrator, Civil Service (Medical): No    Lack of Transportation (Non-Medical): No  Physical Activity: Not on file  Stress: Not on file  Social Connections: Moderately Isolated (12/06/2023)   Social Connection and Isolation Panel [NHANES]    Frequency of Communication with Friends and Family: Once a week    Frequency of Social Gatherings with Friends and Family: Once a week    Attends Religious Services: More than 4 times per year    Active Member of [employer] or Organizations: No    Attends Banker Meetings: Never    Marital Status: Married  Catering manager Violence: Not At Risk (12/06/2023)   Humiliation, Afraid,  Rape, and Kick questionnaire    Fear of Current or Ex-Partner: No    Emotionally Abused: No    Physically Abused: No    Sexually Abused: No    Review of Systems: Pertinent positive and negative review of systems were noted in the above HPI section.  All other review of systems was otherwise negative.   Physical Exam: Vital signs in last 24 hours: Temp:  [97.5 F (36.4 C)-98.3 F (36.8 C)] 98.3 F (36.8 C) (04/08 1335) Pulse Rate:  [32-87] 74 (04/08 1400) Resp:  [12-21] 16 (04/08 1400) BP: (93-126)/(44-76) 94/44 (04/08 1400) SpO2:  [97 %-100 %] 99 %  (04/08 1400) Weight:  [99.8 kg] 99.8 kg (04/08 0835)   General:   Alert,  Well-developed, well-nourished, chronically ill-appearing elderly white male pleasant and cooperative in NAD, family at bedside, mildly dyspneic at rest Head:  Normocephalic and atraumatic. Eyes:  Sclera clear, no icterus.   Conjunctiva pale Ears:  Normal auditory acuity. Nose:  No deformity, discharge,  or lesions. Mouth:  No deformity or lesions.   Neck:  Supple; no masses or thyromegaly. Lungs:   Bibasilar Rales  Heart:  irRegular rate and rhythm; systolic murmur quiet, blowing. Abdomen:  Soft, obese, nontender BS active,nonpalp mass or hsm.   Rectal: Documented heme positive per ER MD no gross melena Msk:  Symmetrical without gross deformities. . Pulses:  Normal pulses noted. Extremities: 1-2+ edema bilateral lower extremities to the shins Neurologic:  Alert and  oriented x4;  grossly normal neurologically. Skin: Scattered tears and ecchymoses, sacral decubitus covered Psych:  Alert and cooperative. Normal mood and affect.  Intake/Output from previous day: No intake/output data recorded. Intake/Output this shift: No intake/output data recorded.  Lab Results: Recent Labs    12/13/23 0910  WBC 6.0  HGB 7.0*  HCT 23.1*  PLT 220   BMET Recent Labs    12/13/23 0910  NA 139  K 3.9  CL 107  CO2 24  GLUCOSE 62*  BUN 29*  CREATININE 1.00  CALCIUM 7.9*   LFT Recent Labs    12/13/23 0910  PROT 5.3*  ALBUMIN 2.7*  AST 31  ALT 21  ALKPHOS 56  BILITOT 1.0   PT/INR No results for input(s): "LABPROT", "INR" in the last 72 hours. Hepatitis Panel No results for input(s): "HEPBSAG", "HCVAB", "HEPAIGM", "HEPBIGM" in the last 72 hours.   IMPRESSION:  #66 83 year old white male presenting with complaint of shortness of breath in setting of recent MI- March 2025 for which she was managed at [facility] and underwent cardiac cath with staged PCI of the LAD then RCA. He had previously been on Eliquis, had  Plavix and aspirin added, also started on midodrine for orthostatic hypotension.  BNP is elevated, troponins elevated and chest x-ray showing new moderate right pleural effusion and right basilar airspace disease.  Appears to be volume overloaded  #2 congestive heart failure with recent echo showing EF 20 to 25% and severe aortic stenosis  #3 severe aortic stenosis #4 COPD #5 prior history of CVA-on Eliquis #6 atrial fibrillation #7 diabetes mellitus  #8 anemia with recent drop in hemoglobin and heme positive stool.  This is likely multifactoral.  He did have bleeding after a Foley catheter was pulled and required 2 units of blood about a week ago. He is also at high risk for gastropathy, peptic ulcer disease with slow GI blood loss in setting of recent severe illness and anticoagulation/antiplatelet therapy. 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  It may be reasonable to manage him conservatively with twice daily PPI, serial hemoglobins, and transfusion if indicated. Plans were discussed with the patient and his family and they understand. GI will follow with you   Amy EsterwoodPA-C  12/13/2023, 2:44 PM  I have taken an interval history, thoroughly reviewed the chart and examined the patient. I agree with the Advanced Practitioner's note, impression and recommendations, and have  recorded additional findings, impressions and recommendations below. I performed a substantive portion of this encounter (>50% time spent), including a complete performance of the medical decision making.  My additional thoughts are as follows:  Acute on chronic anemia, likely multifactorial from his multiple medical issues and what sounds like some ongoing GI blood loss, source unclear as well as some recent urinary blood loss when he inadvertently removed his urinary catheter during a fall.  All of this is complicated by dual antiplatelet therapy and anticoagulation with recent high risk coronary stent placement during non-STEMI with volume overload and severe AAS with reduced LV ejection fraction.  Multiple potential causes, GI bleeding source seems most likely upper based on description of black tarry stool and the clinical picture of this patient.  Peptic ulcer disease, esophagitis, AVM.  Although he had a history of HCC, he is not known to have cirrhosis to suggest esophageal or gastric varices as a source of bleeding.  Review of multiple CT abdomen and pelvis reports does not describe any morphologic changes of cirrhosis or portal hypertension.  In summary, this patient is currently 2 high risk for any endoscopic procedures given his cardiopulmonary condition.  When or whether that will be feasible for him remains to be seen.  In the interim, he needs continued supportive care with PRBCs, twice daily PPI, diuresis, and dedicated evaluation by the cardiology service.  We will follow along.  Should he have any major clinical decompensation with brisk GI bleeding, the next step would be addressing goals of care with his family.  His daughter was at the bedside and has been heavily involved in his care.   Charlie Pitter III Office:[phone]

## 2023-12-13 NOTE — Inpatient Diabetes Management (Signed)
 Inpatient Diabetes Program Recommendations  AACE/ADA: New Consensus Statement on Inpatient Glycemic Control (2015)  Target Ranges:  Prepandial:   less than 140 mg/dL      Peak postprandial:   less than 180 mg/dL (1-2 hours)      Critically ill patients:  140 - 180 mg/dL   Lab Results  Component Value Date   GLUCAP 95 12/13/2023   HGBA1C 9.1 (H) 11/18/2023    Review of Glycemic Control  Latest Reference Range & Units 12/13/23 10:45 12/13/23 11:32  Glucose-Capillary 70 - 99 mg/dL 43 (LL) 95  (LL): Data is critically low  Diabetes history: DM2 Outpatient Diabetes medications: Lantus 50 units BID, Jardiance 25 mg QD Current orders for Inpatient glycemic control: CBG's ac/hs  Met with patient briefly at bedside.  He comes into the ED with SOB; he fell last night at home.  His glucose was 43 mg/dL on arrival to ED.  Confirmed above home medications.  Asked if his BG was low last night when he fell and he said his BG was 80 mg/dl last night and it was 191 mg/dL this morning.  Explained once his BG is > 180 mg/dL we will start some correction insulin and a portion of basal insulin.  He verbalizes understanding.  Will continue to follow while inpatient.  Thank you, Dulce Sellar, MSN, CDCES Diabetes Coordinator Inpatient Diabetes Program [phone] (team pager from 8a-5p)

## 2023-12-13 NOTE — H&P (Signed)
 History and Physical    Patient: Mathew Baker ACZ:[id number] DOB: [birth year] DOA: 12/13/2023 DOS: the patient was seen and examined on 12/13/2023 PCP: Lauro Regulus, MD  Patient coming from: Home  Chief Complaint:  Chief Complaint  Patient presents with   Shortness of Breath   HPI: Mathew Baker is a 83 y.o. male with medical history significant of  hyperlipidemia, hypertension, systolic CHF, severe aortic stenosis, paroxysmal atrial fibrillation on chronic anticoagulation, COPD, T2DM, CVA, and hepatocellular carcinoma transferred from [facility] to [facility] on 3/18 for multivessel disease staged PCI who presents with complaints of shortness of breath.  Patient had just recently been hospitalized at [facility] from 3/31-4/3 after being found to have hematuria with acute blood loss on Eliquis with Foley catheter in place from 3/26 due to urinary retention.  Patient was placed on continuous bladder irrigation and Eliquis was held along with him being started on finasteride.  Hemoglobin dropped down to 7 during the hospitalization for which patient had received 2 units of packed red blood cells.  Hemoglobin at discharge on 4/3 was 8.9.  Patient was seen by urology during this hospitalization, but no other invention was recommended after bleeding stopped and Foley catheter was able to be discontinued.  Since his discharge patient reports that he had been voiding without issue and denies any hematuria.  Over the last day and a half he reported having increasing shortness of breath.  No recent chest pain is reported, with the last episode of chest pain occurring when he had to be transferred to [facility] last month.   He reports having increased swelling and has been coughing continuously, producing white foamy sputum.  He reports melena, describing the stools as 'super, super black' for the past couple of weeks. He recalls having a colonoscopy approximately 20 years ago.  He also reports in the last couple  of days having at least 2 falls.  One of the falls was related to his walker and the other he thinks he may have gotten lightheaded or passed out.  He has resumed Eliquis and reports last dose yesterday evening.  In the emergency department patient was noted to be afebrile with pulse 75-83, and all other vital signs relatively maintained.  Labs significant for hemoglobin 7 (previously 8.9 when checked on 4/3), glucose 62, BNP 540.4, and HS-troponin 520.  Chest x-ray revealed new small to moderate right-sided pleural effusion with right basilar airspace disease.  Stool guaiacs were noted to be positive.  Patient has been given Protonix 80 mg IV x 1 dose and Lasix 40 mg IV.  Cardiology and gastroenterology have been formally consulted.  Review of Systems: As mentioned in the history of present illness. All other systems reviewed and are negative. Past Medical History:  Diagnosis Date   Chronic airway obstruction (HCC)    Degeneration of intervertebral disc of lumbar region    Diabetes mellitus without complication (HCC)    Essential hypertension, benign    Hyperlipidemia    Hypertension    Osteoarthritis of lower extremity    Pneumonia, organism unspecified(486)    PSA elevation 07/16/2013   Normal   Stroke [facility])    Past Surgical History:  Procedure Laterality Date   CATARACT EXTRACTION W/ INTRAOCULAR LENS  IMPLANT, BILATERAL     COLONOSCOPY  2006   Normal: Repeat in 10 yrs   IR PERC CHOLECYSTOSTOMY  05/20/2023   IR RADIOLOGIST EVAL & MGMT  06/22/2023   LEFT HEART CATH N/A 11/21/2023   Procedure:  Left Heart Cath;  Surgeon: Alwyn Pea, MD;  Location: [facility] INVASIVE CV LAB;  Service: Cardiovascular;  Laterality: N/A;  Monday 3/17   LOOP RECORDER INSERTION N/A 02/21/2020   Procedure: LOOP RECORDER INSERTION;  Surgeon: Marcina Millard, MD;  Location: [facility] INVASIVE CV LAB;  Service: Cardiovascular;  Laterality: N/A;   LOOP RECORDER REMOVAL N/A 10/23/2020   Procedure: LOOP RECORDER  REMOVAL;  Surgeon: Marcina Millard, MD;  Location: [facility] INVASIVE CV LAB;  Service: Cardiovascular;  Laterality: N/A;   TEE WITHOUT CARDIOVERSION N/A 09/15/2020   Procedure: TRANSESOPHAGEAL ECHOCARDIOGRAM (TEE);  Surgeon: Dalia Heading, MD;  Location: [facility] ORS;  Service: Cardiovascular;  Laterality: N/A;   Social History:  reports that he quit smoking about 65 years ago. His smoking use included cigarettes. He has never used smokeless tobacco. He reports that he does not drink alcohol and does not use drugs.  No Known Allergies  Family History  Problem Relation Age of Onset   Diabetes Mother     Prior to Admission medications   Medication Sig Start Date End Date Taking? Authorizing Provider  Alcohol Swabs (ALCOHOL WIPES) 70 % PADS USE FOR INSULIN INJECTIONS OR TO FINGER STICK AS DIRECTED BY YOUR MEDICAL PROVIDER 04/28/20   [provider]  aspirin EC 81 MG tablet Take 1 tablet (81 mg total) by mouth daily. Swallow whole. 11/22/23   Alford Highland, MD  atorvastatin (LIPITOR) 80 MG tablet Take 80 mg by mouth daily.    [provider]  carvedilol (COREG) 3.125 MG tablet Take 1 tablet (3.125 mg total) by mouth 2 (two) times daily with a meal. 11/21/23   Alford Highland, MD  clopidogrel (PLAVIX) 75 MG tablet Take 75 mg by mouth daily. 12/03/23 12/02/24  [provider]  docusate sodium (COLACE) 100 MG capsule Take 100 mg by mouth 2 (two) times daily. 05/02/23   [provider]  empagliflozin (JARDIANCE) 25 MG TABS tablet Take 12.5 mg by mouth daily. 12/09/20   [provider]  feeding supplement (ENSURE ENLIVE / ENSURE PLUS) LIQD Take 237 mLs by mouth 3 (three) times daily between meals. 05/24/23   Alford Highland, MD  glucose blood (PRECISION QID TEST) test strip Use to test blood sugar 2 times daily as instructed. Dx: E11.65 08/22/15   [provider]  HYDROcodone-acetaminophen (NORCO/VICODIN) 5-325 MG tablet Take 1 tablet by mouth every 8  (eight) hours as needed. Patient not taking: Reported on 12/06/2023 02/26/21   [provider]  insulin aspart (NOVOLOG) 100 UNIT/ML injection Inject 0-6 Units into the skin 3 (three) times daily as needed for high blood sugar. Patient taking differently: Inject 0-6 Units into the skin 3 (three) times daily as needed for high blood sugar (For Blood Sugar over 150). 05/24/23   Alford Highland, MD  insulin glargine (LANTUS) 100 UNIT/ML injection Inject 50 Units into the skin 2 (two) times daily. 12/02/23   [provider]  Insulin Pen Needle (PEN NEEDLES 29GX1/2") 29G X MISC Use 1x a day 08/21/14   [provider]  ipratropium-albuterol (DUONEB) 0.5-2.5 (3) MG/3ML SOLN Take 3 mLs by nebulization every 4 (four) hours. 11/21/23   Wieting, Richard, MD  latanoprost (XALATAN) 0.005 % ophthalmic solution Place 1 drop into both eyes at bedtime.    [provider]  midodrine (PROAMATINE) 5 MG tablet Take 1 tablet by mouth 2 (two) times daily. 12/02/23 12/01/24  [provider]  nitroGLYCERIN (NITROSTAT) 0.4 MG SL tablet Place 0.4 mg under the tongue every  5 (five) minutes as needed for chest pain. 12/02/23 12/01/24  [provider]  ondansetron (ZOFRAN) 4 MG/2ML SOLN injection Inject 2 mLs (4 mg total) into the vein every 6 (six) hours as needed for nausea or vomiting. 11/21/23   Alford Highland, MD  pantoprazole (PROTONIX) 40 MG tablet Take 1 tablet (40 mg total) by mouth daily. 05/25/23   Alford Highland, MD  polyethylene glycol (MIRALAX / GLYCOLAX) 17 g packet Take 17 g by mouth daily as needed for moderate constipation. 11/21/23   Alford Highland, MD  tamsulosin (FLOMAX) 0.4 MG CAPS capsule Take 0.4 mg by mouth at bedtime.    [provider]  vitamin B-12 (CYANOCOBALAMIN) 100 MCG tablet Take 100 mcg by mouth daily.    [provider]    Physical Exam: Vitals:   12/13/23 0915 12/13/23 0930 12/13/23 1000 12/13/23 1100  BP:  (!) 104/59  104/60 110/71  Pulse: 78 87 64 (!) 32  Resp: (!) 21 12 17  (!) 21  Temp:      TempSrc:      SpO2: 97% 99% 99% 98%  Weight:      Height:       Constitutional: Pleasant elderly male currently in no acute distress. Eyes: PERRL, lids and conjunctivae normal ENMT: Mucous membranes are moist. Posterior pharynx clear of any exudate or lesions.Neck: normal, supple, no masses, Respiratory: clear to auscultation bilaterally, no wheezing, no crackles. Normal respiratory effort. No accessory muscle use.  Cardiovascular: Irregular irregular.  Positive systolic murmur present.  1 pitting edema.  Abdomen: no tenderness, no masses palpated. No hepatosplenomegaly. Bowel sounds positive.  Musculoskeletal: no clubbing / cyanosis. No joint deformity upper and lower extremities. Good ROM, no contractures. Normal muscle tone.  Skin: no rashes, lesions, ulcers. No induration Neurologic: CN 2-12 grossly intact. Sensation intact, DTR normal. Strength 5/5 in all 4.  Psychiatric: Normal judgment and insight. Alert and oriented x 3. Normal mood.   Data Reviewed:   EKG reveals atrial fibrillation at 81 bpm with left bundle branch block. Assessment and Plan:  Heart failure with reduced EF Severe aortic stenosis Acute on chronic.  Patient presents with complaints of progressively worsening shortness of breath and lower extremity swelling.  BNP elevated at 540, but noted to be down from last check of 911.  Last echocardiogram LVEF to be 25- 30% with grade 1 diastolic dysfunction, moderate dilated left atrium, severe aortic stenosis with DI 0.23, valve mean gradient 21 mmHg. Patient had received Lasix 40 mg IV x 1 dose.  -Admit to a progressive bed -Strict I&Os and daily weights -Goal-directed medical therapy limited due to hypotension -Cardiology consulted we will follow-up for any further recommendations.  Elevated troponin CAD status post PCI Patient had recent admission at [facility] with multivessel disease where  he had successful staged PCI LAD and RCA.  High-sensitivity troponins 520-> 497.  Patient denies any reports of chest pain at this time. -Continue antiplatelet therapy resumed due to recent intervention and risk of in-stent restenosis  Symptomatic anemia Suspected GI bleed Acute.  Hemoglobin noted to be 7, but has been noted to be 8.9 when checked last on 12/08/2023.  Patient was typed and screened and ordered to be transfused 1 unit of packed red blood cells.  He had just received 2 units of packed red blood cells during hospitalization 9/1 after hemoglobin had dropped similarly to 7.  Patient reports last colonoscopy 10 years ago. -Continue PPI twice daily -Continue to monitor H&H and transfuse blood products as  needed to goal hemoglobin of least 8 g/dL - Appreciate GI consultative services we will follow-up for any further recommendations  Paroxysmal atrial fibrillation Patient appears relatively rate controlled at this time.  CHA2DS2-VASc score of 7.  Last dose of Eliquis was yesterday evening. -Hold Eliquis  Uncontrolled Diabetes Mellitus type 2 with hypoglycemia,  Last hemoglobin A1c noted to be 9.1.  Home medication regimen includes Lantus 50 units twice daily.  Last dose of Lantus yesterday evening. -Hypoglycemic protocols -Amp of D50 as needed for CBG<70 -Hold Lantus -Resume moderate sliding scale of insulin once blood sugars greater than 180 - Adjust insulin regimen as deemed medically appropriate.  Orthostatic hypotension Syncopal episodes Recurrent falls Blood pressures noted to be soft as low as 93/57 while in the emergency department.  Likely multifactorial in the setting of blood loss and hypoglycemia.  Medications such as Coreg had been on hold and patient had been started on midodrine. -Up with assistance -Continue midodrine -PT to evaluate and treat  BPH Patient reports voiding without any issue at this time. -Continue Flomax  History of tubular adenomas Last  colonoscopy performed back in 2015 noted 2 tubular adenomas which were removed.  GERD -Protonix twice daily   DVT prophylaxis: SCDs Advance Care Planning:   Code Status: Full Code  Consults: Cardiology and gastroenterology Family Communication:   Severity of Illness: The appropriate patient status for this patient is INPATIENT. Inpatient status is judged to be reasonable and necessary in order to provide the required intensity of service to ensure the patient's safety. The patient's presenting symptoms, physical exam findings, and initial radiographic and laboratory data in the context of their chronic comorbidities is felt to place them at high risk for further clinical deterioration. Furthermore, it is not anticipated that the patient will be medically stable for discharge from the hospital within 2 midnights of admission.   * I certify that at the point of admission it is my clinical judgment that the patient will require inpatient hospital care spanning beyond 2 midnights from the point of admission due to high intensity of service, high risk for further deterioration and high frequency of surveillance required.*  Author: Clydie Braun, MD 12/13/2023 11:18 AM  For on call review [email or web address].

## 2023-12-13 NOTE — ED Triage Notes (Addendum)
 Pt presents to ED from home. Short of breath for a week, reports being worst last night. NSTEMI 3 weeks ago. Bilateral lower leg edema., not on fluid pills. AFIB on monitor, no history. Alert and oriented x4. Fell last night and night before, hx of stroke affecting the right side. Reports he thinks he passed out.

## 2023-12-13 NOTE — ED Notes (Signed)
 Pt provided with orange juice and sandwich bag for low blood sugar result.

## 2023-12-13 NOTE — Consult Note (Addendum)
 Cardiology Consultation   Patient ID: Mathew Baker MRN: [id number]; DOB: 13-Aug-1941  Admit date: 12/13/2023 Date of Consult: 12/13/2023  PCP:  Lauro Regulus, MD   [facility] Providers Cardiologist:  [facility]  Chief complaint: Shortness of breath, syncope  Reason of consult: syncope  Requesting provider: Dr. Katrinka Blazing   Patient Profile:   Mathew Baker is a 83 y.o. 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 Katrinka Blazing.  History of Present Illness:   [name] has past medical history as stated above. He has had a complicated last month in regards to his health that will be summarized as follows. He presented to [facility] ED on 3/14 complaining of shortness of breath. He was diagnosed with an NSTEMI (troponin levels 110 > 564 > 24k) and underwent a LHC. Cardiac cath showed extensive diffuse CAD (left main 70% distal, LAD 90% ostial, 95% proximal and 50% moderate to severe calcification, circumflex 100% occluded. EF 25 to 30% on echocardiogram) that would require high risk CABG vs high risk PCI. The decision was then made to transfer care to [facility] hospital to undergo further treatment.   He was accepted at [facility] 3/18. At [facility] they decided to proceed with PCI as opposed to CABG when considering age and other comorbidities. Patient underwent LHC 3/19 rotarectomy and PCI with 2 x DES to LM into Lcx. IABP placed post procedure for support. Patient was transferred to the CICU s/p LHC with planned staged PCI to RCA. IABP removed 3/21 PM. Transferred to floor 3/23, pending staged PCI. Staged PCI completed 3/24 with PCI x 2 to RCA. Completed triple therapy with ASA while inpatient and ASA  discontinued at discharge. Patient discharged home on statin, plavix.   In regards to his severe AS he presented to [facility] with progressive fatigue, cough with pro-BNP at 4k, CXR showed bilateral pulmonary edema and pleural effusions. Echo showed EF 25-30%. He required IV diuresis.GDMT unable to be initiated due to persistent orthostatinc hypotension. Did not tolerate spironolactone, lisinopril or Coreg. He required initiation of midodrine 5 mg 2 x day, and this was continued at discharge. His hemoglobin during his admission at [facility] was 9.6, noted to be 11 in November. With his paroxysmal atrial fibrillation, he was in NSR when admitted to [facility]. Continued on home Eliquis. Coreg was held due to hypotension.   Other issues he experienced during this admission included urinary retention. Patient was seen by urology who discharged patient home with foley and close follow up outpatient. He was discharged from [facility] on 12/02/23.  He then presented to [facility] on 12/05/23 with urinary retention. He also complained of blood in his foley bag. He was hypotensive when he arrived to [facility] ED with BP 98/54, which improved to 130/78 after receiving IV fluids. His hemoglobin was 7.7, down fro 9.8 couple weeks prior and 11 back in November. EKG showed atrial fibrillation with HR in the 60s. When he was brought in by EMS, he had a brief syncopal episode. He denied any chest pain or shortness of breath at that time. He was then discharged 12/08/23 from [facility] after holding Eliquis, receiving 2 units of pRBCs, undergoing voiding trial after removal of foley catheter. It was believed that his syncope  was likely related to his orthostatic hypotension, as he had improved with IV fluid bolus. GDMT was severely limited for HFrEF in the setting of hypotension. His daughter reported that she believed that he fell within a week of being discharged, pulled on the Foley during a fall, resulting in significant bleeding.   He then presented to  [facility] ED on 12/13/23 complaining of shortness of breath. He called EMS because he felt more short of breath than normal. Reported that he felt like he was carrying extra fluid everywhere. He admitted to having orthopnea but denied any chest pain. He has a stable, chronic cough. EMS noted that the patient is in A. Fib. Patient states that his right leg is chronically swollen when compared to his left as a result of a prior stroke. He reports two recent falls in the last couple of days. The first appears to be a mechanical fall where the walker had an issue but the second time he reports getting lightheaded and slid down but no LOC. He denies any injuries from either of these events. Patient's daughter reports that they have been unable to follow up outpatient with cardiology due to the patient being sick.   Relevant workup in the ED includes: EKG showed A. Fib with HR 78 bpm with previously noted LBBB, CXR showed new small to moderate right pleural effusion and right airspace disease, CBC showed hemoglobin of 7, BNP elevated at 540 (911 last week), BMP showed hypoglycemia (down to 43, given juice and sandwich), troponin 520 > 497, positive fecal occult blood. He was given 1 unit pRBC, IV Lasix 40 mg x 1, IV Protonix 80 mg x 1.  He was admitted to medicine service. Cardiology and GI were consulted.   Past Medical History:  Diagnosis Date   Chronic airway obstruction (HCC)    Degeneration of intervertebral disc of lumbar region    Diabetes mellitus without complication (HCC)    Essential hypertension, benign    Hyperlipidemia    Hypertension    Osteoarthritis of lower extremity    Pneumonia, organism unspecified(486)    PSA elevation 07/16/2013   Normal   Stroke [facility])    Past Surgical History:  Procedure Laterality Date   CATARACT EXTRACTION W/ INTRAOCULAR LENS  IMPLANT, BILATERAL     COLONOSCOPY  2006   Normal: Repeat in 10 yrs   IR PERC CHOLECYSTOSTOMY  05/20/2023   IR RADIOLOGIST EVAL &  MGMT  06/22/2023   LEFT HEART CATH N/A 11/21/2023   Procedure: Left Heart Cath;  Surgeon: Alwyn Pea, MD;  Location: [facility] INVASIVE CV LAB;  Service: Cardiovascular;  Laterality: N/A;  Monday 3/17   LOOP RECORDER INSERTION N/A 02/21/2020   Procedure: LOOP RECORDER INSERTION;  Surgeon: Marcina Millard, MD;  Location: [facility] INVASIVE CV LAB;  Service: Cardiovascular;  Laterality: N/A;   LOOP RECORDER REMOVAL N/A 10/23/2020   Procedure: LOOP RECORDER REMOVAL;  Surgeon: Marcina Millard, MD;  Location: [facility] INVASIVE CV LAB;  Service: Cardiovascular;  Laterality: N/A;   TEE WITHOUT CARDIOVERSION N/A 09/15/2020   Procedure: TRANSESOPHAGEAL ECHOCARDIOGRAM (TEE);  Surgeon: Dalia Heading, MD;  Location: [facility] ORS;  Service: Cardiovascular;  Laterality: N/A;    Home Medications:  Prior to Admission medications   Medication Sig Start Date End Date Taking? Authorizing Provider  aspirin EC 81 MG tablet Take 1 tablet (81 mg total) by mouth daily. Swallow whole. 11/22/23  Yes Wieting, Richard, MD  atorvastatin (LIPITOR) 80 MG tablet Take 80 mg by mouth  daily.   Yes [provider]  carvedilol (COREG) 3.125 MG tablet Take 1 tablet (3.125 mg total) by mouth 2 (two) times daily with a meal. 11/21/23  Yes Wieting, Richard, MD  clopidogrel (PLAVIX) 75 MG tablet Take 75 mg by mouth daily. 12/03/23 12/02/24 Yes [provider]  docusate sodium (COLACE) 100 MG capsule Take 100 mg by mouth 2 (two) times daily. 05/02/23  Yes [provider]  empagliflozin (JARDIANCE) 25 MG TABS tablet Take 12.5 mg by mouth daily. 12/09/20  Yes [provider]  feeding supplement (ENSURE ENLIVE / ENSURE PLUS) LIQD Take 237 mLs by mouth 3 (three) times daily between meals. 05/24/23  Yes Wieting, Richard, MD  insulin aspart (NOVOLOG) 100 UNIT/ML injection Inject 0-6 Units into the skin 3 (three) times daily as needed for high blood sugar. Patient taking differently: Inject 0-6 Units into the skin 3  (three) times daily as needed for high blood sugar (For Blood Sugar over 150). 05/24/23  Yes Wieting, Richard, MD  insulin glargine (LANTUS) 100 UNIT/ML injection Inject 50 Units into the skin 2 (two) times daily. 12/02/23  Yes [provider]  ipratropium-albuterol (DUONEB) 0.5-2.5 (3) MG/3ML SOLN Take 3 mLs by nebulization every 4 (four) hours. 11/21/23  Yes Wieting, Richard, MD  latanoprost (XALATAN) 0.005 % ophthalmic solution Place 1 drop into both eyes at bedtime.   Yes [provider]  midodrine (PROAMATINE) 5 MG tablet Take 1 tablet by mouth 2 (two) times daily. 12/02/23 12/01/24 Yes [provider]  nitroGLYCERIN (NITROSTAT) 0.4 MG SL tablet Place 0.4 mg under the tongue every 5 (five) minutes as needed for chest pain. 12/02/23 12/01/24 Yes [provider]  ondansetron (ZOFRAN) 4 MG/2ML SOLN injection Inject 2 mLs (4 mg total) into the vein every 6 (six) hours as needed for nausea or vomiting. 11/21/23  Yes Wieting, Richard, MD  pantoprazole (PROTONIX) 40 MG tablet Take 1 tablet (40 mg total) by mouth daily. 05/25/23  Yes Wieting, Richard, MD  polyethylene glycol (MIRALAX / GLYCOLAX) 17 g packet Take 17 g by mouth daily as needed for moderate constipation. 11/21/23  Yes Wieting, Richard, MD  tamsulosin (FLOMAX) 0.4 MG CAPS capsule Take 0.4 mg by mouth at bedtime.   Yes [provider]  vitamin B-12 (CYANOCOBALAMIN) 100 MCG tablet Take 100 mcg by mouth daily.   Yes [provider]  Alcohol Swabs (ALCOHOL WIPES) 70 % PADS USE FOR INSULIN INJECTIONS OR TO FINGER STICK AS DIRECTED BY YOUR MEDICAL PROVIDER 04/28/20   [provider]  glucose blood (PRECISION QID TEST) test strip Use to test blood sugar 2 times daily as instructed. Dx: E11.65 08/22/15   [provider]  Insulin Pen Needle (PEN NEEDLES 29GX1/2") 29G X MISC Use 1x a day 08/21/14   [provider]   Inpatient Medications: Scheduled Meds:  aspirin EC  81 mg  Oral Daily   clopidogrel  75 mg Oral Daily   dextrose  1 ampule Intravenous Once   sodium chloride flush  3 mL Intravenous Q12H   Continuous Infusions:  PRN Meds: acetaminophen **OR** acetaminophen, albuterol, dextrose  Allergies:   No Known Allergies  Social History:   Social History   Socioeconomic History   Marital status: Married    Spouse name: Not on file   Number of children: Not on file   Years of education: Not on file   Highest education level: Not on file  Occupational History   Not on file  Tobacco Use  Smoking status: Former    Current packs/day: 0.00    Types: Cigarettes    Quit date: 09/06/1958    Years since quitting: 65.3   Smokeless tobacco: Never  Vaping Use   Vaping status: Never Used  Substance and Sexual Activity   Alcohol use: No    Alcohol/week: 0.0 standard drinks of alcohol   Drug use: No   Sexual activity: Not on file  Other Topics Concern   Not on file  Social History Narrative   Married   Retired; 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; One of the original [employer] in the early 60's   1 daughter   Social Drivers of Corporate investment banker Strain: Low Risk  (11/26/2023)   Received from [facility]   Overall Financial Resource Strain (CARDIA)    Difficulty of Paying Living Expenses: Not hard at all  Food Insecurity: No Food Insecurity (12/13/2023)   Hunger Vital Sign    Worried About Running Out of Food in the Last Year: Never true    Ran Out of Food in the Last Year: Never true  Transportation Needs: No Transportation Needs (12/13/2023)   PRAPARE - Administrator, Civil Service (Medical): No    Lack of Transportation (Non-Medical): No  Physical Activity: Not on file  Stress: Not on file  Social Connections: Moderately Isolated (12/13/2023)   Social Connection and Isolation Panel [NHANES]    Frequency of Communication with Friends and Family: Once a week    Frequency of Social Gatherings with Friends and Family: Once a  week    Attends Religious Services: More than 4 times per year    Active Member of [employer] or Organizations: No    Attends Banker Meetings: Never    Marital Status: Married  Catering manager Violence: Not At Risk (12/13/2023)   Humiliation, Afraid, Rape, and Kick questionnaire    Fear of Current or Ex-Partner: No    Emotionally Abused: No    Physically Abused: No    Sexually Abused: No    Family History:   Family History  Problem Relation Age of Onset   Diabetes Mother     ROS:  Review of Systems  Cardiovascular:  Positive for leg swelling and near-syncope. Negative for chest pain, claudication, irregular heartbeat, orthopnea, palpitations, paroxysmal nocturnal dyspnea and syncope.  Respiratory:  Negative for shortness of breath.   Hematologic/Lymphatic: Negative for bleeding problem.  Musculoskeletal:  Positive for arthritis and falls.  Gastrointestinal:  Positive for melena.  Genitourinary:  Positive for hematuria (traumatic, foley.).       Hx of urinary retention     Physical Exam/Data:   Vitals:   12/13/23 1430 12/13/23 1500 12/13/23 1536 12/13/23 1600  BP: (!) 97/58 111/65 102/79 98/68  Pulse: 73 77 76 76  Resp: 16 20 20 14   Temp:   97.9 F (36.6 C)   TempSrc:   Oral   SpO2: 99% 100% 98% 99%  Weight:      Height:       No intake or output data in the 24 hours ending 12/13/23 1709    12/13/2023    8:35 AM 12/07/2023    5:00 AM 12/05/2023    7:35 PM  Last 3 Weights  Weight (lbs) 220 lb 206 lb 2.1 oz 201 lb  Weight (kg) 99.791 kg 93.5 kg 91.173 kg     Body mass index is 29.03 kg/m.  General:  Older than stated age, no acute distress, resting,  on room air, no family in room  HEENT: normal Neck: no JVD Vascular: Distal pulses 2+ bilaterally Cardiac:  normal S1, S2; irregularly irregular rhythm; systolic murmur  Lungs:  bibasilar crackles, diminished breath sounds   Abd: soft, nontender Ext: 1+ bilateral LE edema Musculoskeletal:  No  deformities Skin: warm and dry  Neuro:  no focal abnormalities noted Psych:  Normal affect   EKG:  The EKG was personally reviewed and demonstrates:  atrial fibrillation with HR 81, known LBBB   Telemetry:  Telemetry was personally reviewed and demonstrates:  atrial fibrillation, HR 60-70s, PVCs  Relevant CV Studies: LHC 11/21/2023   Prox RCA lesion is 75% stenosed.   Mid RCA-1 lesion is 75% stenosed.   Mid RCA-2 lesion is 75% stenosed.   Mid RCA to Dist RCA lesion is 90% stenosed.   Dist RCA lesion is 50% stenosed with 50% stenosed side branch in RPAV.   Ost RCA to Prox RCA lesion is 50% stenosed.   Ost LM to Mid LM lesion is 70% stenosed.   Dist LM lesion is 90% stenosed.   Ost Cx lesion is 100% stenosed.   Ost LAD to Prox LAD lesion is 95% stenosed.   Prox LAD to Dist LAD lesion is 50% stenosed.   2nd Diag lesion is 75% stenosed.   There is moderate to severe left ventricular systolic dysfunction.   LV end diastolic pressure is moderately elevated.   The left ventricular ejection fraction is 25-35% by visual estimate.   Anticipated discharge date to be determined.   Recommend to resume Apixaban, at currently prescribed dose and frequency.   Conclusion Left heart cath right radial approach Non-STEMI shortness of breath   Left ventriculogram Left ventricular enlargement global hypokinesis EF around 30%   Coronaries Left main large moderate to severe calcification 70% distal LAD large 90% ostial 95% proximal diffuse 50% moderate to severe calcification proximally Circumflex 100% occluded ostially moderate to severe calcification RCA large serial 75% lesions mid heavy calcification 90% mid to distal moderate calcification right dominant system moderate disease diffusely distally   Intervention deferred Recommend evaluation for high risk coronary bypass surgery versus high risk PCI and stent for complex disease Medical therapy is also not unreasonable Will discuss images  with tertiary care facility  Echocardiogram 11/19/2023 IMPRESSIONS  1. Left ventricular ejection fraction, by estimation, is 25 to 30%. The  left ventricle has severely decreased function. The left ventricle  demonstrates global hypokinesis. There is mild concentric left ventricular  hypertrophy. Left ventricular diastolic   parameters are consistent with Grade I diastolic dysfunction (impaired  relaxation). Elevated left ventricular end-diastolic pressure.   2. Right ventricular systolic function is normal. The right ventricular  size is normal. There is normal pulmonary artery systolic pressure.   3. Left atrial size was moderately dilated.   4. The mitral valve is normal in structure. Mild mitral valve  regurgitation. No evidence of mitral stenosis.   5. Dimensionless index in 0.23, consistent with severe aortic stenosis.  The severity of the aortic stenosis is likely underestimated by mean  gradient due to low flow caused by systolic dysfunction.. The aortic valve  is calcified. There is severe  calcifcation of the aortic valve. There is severe thickening of the aortic  valve. Aortic valve regurgitation is mild to moderate. Severe aortic valve  stenosis. Aortic valve mean gradient measures 21.0 mmHg. Aortic valve Vmax  measures 2.95 m/s.   6. The inferior vena cava is dilated in size with <50%  respiratory  variability, suggesting right atrial pressure of 15 mmHg.   PCI 3/19 -- [facility] Planned PCI of the LAD and LM. Patient known to have severe AS and had repeat [facility] echo to evaluate. Placed a 50cc IABP at the beginning of the case via ultrasound guidance for support. Obtained right radial artery access. Primary wired the LM into LAD  with a rota floppy wire. Performed 4 runs of rotational atherectomy with a 1.5 burr. Post-dilated with a 2.5 then a 3.0 x 20 [place]. IVUS revealed severe concentric calcification with fractured calcium and distal diameter of 3.0, Stent with a 3.0 x 38  Synergy  overlapped proximally with a  4.0 x 16 [place], and post-dilated with a 3.0 and 4.0 [place]. IABP sutured in placed.   Recommendations:  Successful PCI of the LM into LCx. Plan for RCA PCI on Friday with Dr. Yetta Barre. Recommend leaving IABP in place if tolerated until PCI Friday. DAPT for a year if tolerated. Begin evaluation for TAVR.   PCI 3/24 -- [facility] Severely calcified RCA. IABP removed over the weekend, so replaced it for the PCI today via left common femoral artery. Primary wired the RCA with the viper wire. Performed 5 runs of orbital atherectomy to the distal and proximal RCA. Post-dilated with a  2.5 and 3.0 [place]. IVUS demonstrated distal reference diameter of 3.2-3.4 so placed a 3.0 x 38 Synergy stent overlapped with a 3.5 x38 Synergy stent, and post-dilated with a 3.5 [place] and 4.0 [place]. IABP removed at the end of the case and perclosed.  Recommendations: DAPT for at least a year. Evaluation of aortic stenosis for need for TAVR. Further management per primary team.   Laboratory Data:  High Sensitivity Troponin:   Recent Labs  Lab 11/19/23 0836 12/05/23 1951 12/06/23 0313 12/13/23 0910 12/13/23 1050  TROPONINIHS >24,000* 116* 109* 520* 497*     Chemistry Recent Labs  Lab 12/07/23 0135 12/13/23 0910  NA 132* 139  K 4.6 3.9  CL 101 107  CO2 24 24  GLUCOSE 198* 62*  BUN 35* 29*  CREATININE 1.00 1.00  CALCIUM 7.5* 7.9*  GFRNONAA >60 >60  ANIONGAP 7 8    Recent Labs  Lab 12/13/23 0910  PROT 5.3*  ALBUMIN 2.7*  AST 31  ALT 21  ALKPHOS 56  BILITOT 1.0   Lipids No results for input(s): "CHOL", "TRIG", "HDL", "LABVLDL", "LDLCALC", "CHOLHDL" in the last 168 hours.  Hematology Recent Labs  Lab 12/07/23 0135 12/08/23 0927 12/13/23 0910  WBC 6.6 7.3 6.0  RBC 2.45* 2.69* 2.16*  HGB 7.6* 8.9* 7.0*  HCT 23.7* 27.4* 23.1*  MCV 96.7 101.9* 106.9*  MCH 31.0 33.1 32.4  MCHC 32.1 32.5 30.3  RDW 22.5* 21.9* 22.0*  PLT 229 243 220   Thyroid No results for input(s): "TSH",  "FREET4" in the last 168 hours.  BNP Recent Labs  Lab 12/13/23 0910  BNP 540.4*    DDimer No results for input(s): "DDIMER" in the last 168 hours.  Radiology/Studies:  DG Chest 2 View Result Date: 12/13/2023 CLINICAL DATA:  dyspnea EXAM: CHEST - 2 VIEW COMPARISON:  11/20/2023 FINDINGS: New small to moderate right pleural effusion. New airspace disease at the right lung base. Some improved left retrocardiac aeration. Heart size and mediastinal contours are within normal limits. Aortic Atherosclerosis (ICD10-170.0). Visualized bones unremarkable. IMPRESSION: New small to moderate right pleural effusion and right basilar airspace disease. Electronically Signed   By: Corlis Leak M.D.   On: 12/13/2023 09:02  Assessment and Plan:   Acute on chronic HFrEF Severe low-flow, low-gradient aortic stenosis  Echo 11/2023: EF 25-30%, global hypokinesis, mild LVH, G1DD, normal RV function, moderately dilated LA, DI of 0.23, aortic valve mean gradient 21 mmHg, dilated IV Patient was being followed by [facility] for further evaluation for TAVR BNP elevated at 540 today, compared to 911 last week  Patient says he has been feeling some shortness of breath  He appears mildly volume overloaded on exam  Creatinine stable today, at baseline 1.0 Received IV Lasix 40 mg x 1 in the ED -- continue IV Lasix 20mg  daily  GDMT has already been limited in the setting of hypotension   CAD s/p staged PCI of LAD, RCA, 11/2023 at [facility] from 11/21/2023 showed: severe multivessel CAD, resulted in patient being transferred to [facility] for further care PCI from 3/19: Successful PCI of the LM into Lcx. Placed a 50cc IABP at the beginning of the case via ultrasound guidance for support, left in for secondary PCI PCI from 3/24: Successful PCI of RCA with 5 runs of orbital atherectomy to the distal and proximal RCA  Troponin 520 > 497 (> 24k 3 weeks prior with NSTEMI) Patient denies any current chest pain  Home meds: DAPT (ASA + Plavix),  Lipitor 80 mg, CoReg 3.125 mg (recently held in setting of hypotension) Patient was taking DAPT with ASA + Plavix since undergoing recent PCI -- was to continue for 12 months if tolerated since it was secondary to ACS.  Will continue DAPT since the last PCI was two weeks ago - to prevent stent thrombosis.  Will hold OAC due to concern for bleeding - see below.   Paroxysmal atrial fibrillation  CHA2DS2-VASc score of 7  Currently in atrial fibrillation with HR 70s  Beta-blocker was held at some point due to hypotension  Was on Eliquis, which was then held in the setting of an acute bleed -- patient states that he was re-instructed to take it after being discharged from [facility] the second time, held it after he started experiencing symptoms yesterday. Currently holding Eliquis is the setting of an acute GI bleed -- as risk outweigh the benefit. Had 2u PPRB while at [facility] and 1 u PRBC in ED. May reconsider once bleeding source is treated and HB is stable.  Outpt evaluation for LAAO  Rate controlled with HR 70-80s currently  Beta-blocker was stopped in the setting of orthostatic hypotension   Syncopal episodes Orthostatic hypotension on midodrine  Anemia, recently required 2 units pRBC at [facility]  Most recent BP 102/79 but got down to 94/44 while admitted  Hemoglobin on admission 7.0, compared to 8.9 on 4/3, 11.7 on 06/2023, 14.0 05/2023 Patient recently required two units of pRBC when presented to [facility] for hematuria Currently undergoing blood transfusions now He has a known history of orthostatic hypotension and has started on midodrine 5 mg BID recently, when at [facility]  He presented with blood glucose of 43 to the ED Syncopal episodes were orginally thought to be 2/2 to orthostatic hypotension however there are likely many underlying causes -- hemoglobin of 7, blood glucose of 43, severe AS, orthostatic hypotension GI is on board regarding GI bleed, appreciate their input  Currently receiving 1 unit  pRBC, can reevaluate for further workup once hemoglobin is recovered and stable   Per primary History of CVA Significant anemia COPD History of CVA Diabetes  History of hepatocellular carcinoma   Risk Assessment/Risk Scores:      [place] Heart Association (NYHA) Functional Class NYHA Class II  CHA2DS2-VASc Score = 7   This indicates a 11.2% annual risk of stroke. The patient's score is based upon: CHF History: 1 HTN History: 0 Diabetes History: 1 Stroke History: 2 Vascular Disease History: 1 Age Score: 2 Gender Score: 0    For questions or updates, please contact [facility] Please consult [email or web address] for contact info under   Signed, Olena Leatherwood, PA-C  12/13/2023 5:09 PM  ADDENDUM:   Patient seen and examined. I personally taken a history, examined the patient, reviewed relevant notes,  laboratory data / imaging studies.  I performed a substantive portion of this encounter and formulated the important aspects of the plan.  I agree with the APP's note, impression, and recommendations; however, I have edited the note to reflect changes or salient points.   Patient has a complex past medical history as outlined above who presents to the hospital with a chief complaint of shortness of breath and passing out/fall.  Cardiology is asked to evaluate him during his hospitalization given his history of NSTEMI/recent coronary intervention and ? syncope.   Patient is not the best historian but does provide limited HPI.  He denies anginal chest pain.  He has been experiencing shortness of breath at rest and also with effort related activities, lower extremity swelling, but no orthopnea or PND.  Patient has had mechanical falls in the past and prior to today's hospitalization he describes nearly passing out but no overt loss of consciousness.  Patient is also currently being evaluated for GI bleed.  He received 2 units of PRBCs while he was at [facility] in the recent  past.  He is currently receiving 1 unit of PRBC.  Patient states that he is having melanotic stools even prior to his PCI's when he was at [facility].  And recently was found to be in A-fib and Eliquis was started for thromboembolic prophylaxis given his CHA2DS2-VASc score and prior history of stroke.  Based on care everywhere on  11/23/2023 to the left main into the LCx and underwent RCA PCI on 11/28/2023.  PHYSICAL EXAM: Today's Vitals   12/13/23 1500 12/13/23 1536 12/13/23 1600 12/13/23 1730  BP: 111/65 102/79 98/68 115/63  Pulse: 77 76 76   Resp: 20 20 14 16   Temp:  97.9 F (36.6 C)  98.4 F (36.9 C)  TempSrc:  Oral  Oral  SpO2: 100% 98% 99%   Weight:      Height:      PainSc:       Body mass index is 29.03 kg/m.   Net IO Since Admission: 21.83 mL [12/13/23 1942]  Filed Weights   12/13/23 0835  Weight: 99.8 kg    Physical Exam  Constitutional: No distress. He appears chronically ill.  hemodynamically stable  Neck: JVD present.  Cardiovascular: Normal rate, S1 normal and S2 normal. An irregularly irregular rhythm present. Exam reveals no gallop, no S3 and no S4.  Murmur heard. Systolic murmur is present with a grade of 3/6 at the upper right sternal border. High-pitched blowing holosystolic  murmur of grade 3/6 is also present at the apex. Pulses:      Carotid pulses are  on the right side with bruit and  on the left side with bruit. Pulmonary/Chest: Effort normal. No stridor. He has no wheezes. He has bibasilar rales.  Abdominal: Soft. He exhibits no distension. There is no abdominal tenderness.  Musculoskeletal:  General: Edema present.     Cervical back: Neck supple.  Neurological: He is alert and oriented to person, place, and time.  Skin: Skin is warm.    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  Ischemic cardiomyopathy Recent NSTEMI status post multivessel PCI Melanotic stools, gross hematuria secondary to traumatic Foley/positive fecal occult blood/status post PRBCs/suspected GI bleed Elevated troponins, demand ischemia in the setting of cardiomyopathy, recent NSTEMI, and suspected GI bleed Aortic stenosis, low-flow low gradient Atrial fibrillation, controlled ventricular rate History of orthostatic hypotension, on midodrine Uncontrolled diabetes mellitus type 2-presents with hypoglycemia History of tubular adenoma  Recommendations:  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 EKG nonischemic. Troponins above normal limits but likely secondary to demand ischemia. Patient underwent complex PCI to the left main/LCx and most recent to the RCA on November 28, 2023. Recommend at least dual antiplatelet therapy for 1 month from his last stent on November 28, 2023 to decrease chances of stent thrombosis. Uptitration of GDMT has been difficult due to hypotension, as per [facility] records in Care Everywhere.  Near-syncope/falls Multifactorial: Baseline orthostatic hypotension, hypoglycemia on arrival blood glucose 43, suspected GI bleed/anemia, deconditioning, history of falls, and underlying valvular heart disease in the setting of cardiomyopathy, etc. Continue midodrine, at home dose Check orthostatic vital signs Diabetes/hyperglycemia management per primary team Suspected GI bleed/anemia: GI following Gross hematuria secondary to traumatic Foley status post fall Aortic stenosis-being considered for TAVR at [facility] health  Symptomatic anemia: melanotic  stools, gross hematuria secondary to traumatic Foley/positive fecal occult blood/status post PRBCs/suspected GI bleed Management per primary team/GI service. Status post 2 units of PRBCs while at [facility] Status post 1 unit of PRBC in the ER during this hospitalization  Aortic stenosis, low-flow low gradient: Being considered for TAVR workup as outpatient at [facility] health  Atrial fibrillation, controlled ventricular rate: Currently not on AV nodal blocking agents due to orthostasis and hypotension in the past, per EMR Not on antiarrhythmic medications. Was placed on Eliquis for thromboembolic prophylaxis.  However has required a total of 3 units of PRBCs in the recent past suspected source either GI / traumatic foley.  His comorbidities and high CHA2DS2-VASc SCORE does place him at higher risk for thromboembolic event.  However in the setting of suspected GI bleed/anemia requiring blood transfusion had a long discussion with patient and his daughter Lurena Joiner regarding antiplatelets and anticoagulation management.    After discussing the risks, benefits, alternatives to oral anticoagulation and antiplatelets with patient and Lurena Joiner participated in a shared decision-making process to continue antiplatelets given his recent NSTEMI and multiple coronary intervention approximate 2 weeks ago.  They understand that not being on anticoagulation predisposes him to possible thromboembolic events (i.e. stroke)and therefore needs to be more cognizant and to seek medical attention if such symptoms arise.  On the contrary stopping antiplatelets when he underwent multivessel PCI places him at a higher risk for stent thrombosis which ultimately can precipitate another ACS event.  Decision is difficult in the setting of suspected bleed but patient and daughter would favor antiplatelets versus anticoagulation for now.  This will  need to be reevaluated if his clinical status changes.  Once his bleeding sources are corrected  and hemoglobin stabilizes a retrial of anticoagulation could be considered or outpatient workup for left atrial appendage occlusion device.  Alternatively, once he has been on dual antiplatelet therapy for at least 1 month and hemoglobin remained stable could consider stopping ASA and doing only Plavix and retrial of anticoagulation.  He needs to have close follow-up with cardiology as outpatient to help decision making processes and monitoring H&H.   History of orthostatic hypotension, on midodrine  Uncontrolled diabetes mellitus type 2-presents with hypoglycemia Lab Results  Component Value Date   HGBA1C 9.1 (H) 11/18/2023  Was hypoglycemic on arrival. Diabetes management per primary  History of tubular adenoma: Management per primary and GI service  As part of this consult reviewed progress notes from [facility] in Care Everywhere dated 11/22/2023, cath report from March 2025 Care Everywhere, labs April 2025, EKG, called the patient's daughter over the phone Lurena Joiner to discuss his care and to involve the patient and family in a shared decision-making process with regards to antiplatelet/anticoagulation treatments.  Further recommendations to follow as the case evolves.   This note was created using a voice recognition software as a result there may be grammatical errors inadvertently enclosed that do not reflect the nature of this encounter. Every attempt is made to correct such errors.   Tessa Lerner, DO, [facility]  [address] [place], [place] [address] Pager: [phone] Office: [phone] 12/13/2023 7:42 PM

## 2023-12-14 DIAGNOSIS — I4891 Unspecified atrial fibrillation: Secondary | ICD-10-CM

## 2023-12-14 DIAGNOSIS — D649 Anemia, unspecified: Secondary | ICD-10-CM | POA: Diagnosis not present

## 2023-12-14 DIAGNOSIS — I2510 Atherosclerotic heart disease of native coronary artery without angina pectoris: Secondary | ICD-10-CM | POA: Diagnosis not present

## 2023-12-14 DIAGNOSIS — R55 Syncope and collapse: Secondary | ICD-10-CM

## 2023-12-14 DIAGNOSIS — I5023 Acute on chronic systolic (congestive) heart failure: Secondary | ICD-10-CM

## 2023-12-14 DIAGNOSIS — I2489 Other forms of acute ischemic heart disease: Secondary | ICD-10-CM

## 2023-12-14 DIAGNOSIS — I255 Ischemic cardiomyopathy: Secondary | ICD-10-CM | POA: Diagnosis not present

## 2023-12-14 DIAGNOSIS — Z955 Presence of coronary angioplasty implant and graft: Secondary | ICD-10-CM | POA: Insufficient documentation

## 2023-12-14 LAB — FERRITIN: Ferritin: 87 ng/mL (ref 24–336)

## 2023-12-14 LAB — BASIC METABOLIC PANEL WITH GFR
Anion gap: 5 (ref 5–15)
BUN: 30 mg/dL — ABNORMAL HIGH (ref 8–23)
CO2: 27 mmol/L (ref 22–32)
Calcium: 7.8 mg/dL — ABNORMAL LOW (ref 8.9–10.3)
Chloride: 107 mmol/L (ref 98–111)
Creatinine, Ser: 1.03 mg/dL (ref 0.61–1.24)
GFR, Estimated: >60 mL/min (ref >60)
Glucose, Bld: 139 mg/dL — ABNORMAL HIGH (ref 70–99)
Potassium: 3.8 mmol/L (ref 3.5–5.1)
Sodium: 139 mmol/L (ref 135–145)

## 2023-12-14 LAB — CBC
HCT: 24.7 % — ABNORMAL LOW (ref 39.0–52.0)
HCT: 26.2 % — ABNORMAL LOW (ref 39.0–52.0)
Hemoglobin: 7.5 g/dL — ABNORMAL LOW (ref 13.0–17.0)
Hemoglobin: 8 g/dL — ABNORMAL LOW (ref 13.0–17.0)
MCH: 30.1 pg (ref 26.0–34.0)
MCH: 30.1 pg (ref 26.0–34.0)
MCHC: 30.4 g/dL (ref 30.0–36.0)
MCHC: 30.5 g/dL (ref 30.0–36.0)
MCV: 99.2 fL (ref 80.0–100.0)
MCV: 98.5 fL (ref 80.0–100.0)
Platelets: 197 10*3/uL (ref 150–400)
Platelets: 201 10*3/uL (ref 150–400)
RBC: 2.49 MIL/uL — ABNORMAL LOW (ref 4.22–5.81)
RBC: 2.66 MIL/uL — ABNORMAL LOW (ref 4.22–5.81)
RDW: 28.4 % — ABNORMAL HIGH (ref 11.5–15.5)
RDW: 28.2 % — ABNORMAL HIGH (ref 11.5–15.5)
WBC: 5.4 10*3/uL (ref 4.0–10.5)
WBC: 5.8 10*3/uL (ref 4.0–10.5)
nRBC: 0 % (ref 0.0–0.2)
nRBC: 0 % (ref 0.0–0.2)

## 2023-12-14 LAB — VITAMIN B12: Vitamin B-12: 1460 pg/mL — ABNORMAL HIGH (ref 180–914)

## 2023-12-14 LAB — TYPE AND SCREEN
ABO/RH(D): B POS
Antibody Screen: NEGATIVE
Unit division: 0

## 2023-12-14 LAB — FOLATE: Folate: 20.8 ng/mL (ref >5.9)

## 2023-12-14 LAB — RETICULOCYTES
Immature Retic Fract: 38.8 % — ABNORMAL HIGH (ref 2.3–15.9)
RBC.: 2.61 MIL/uL — ABNORMAL LOW (ref 4.22–5.81)
Retic Count, Absolute: 162.3 10*3/uL (ref 19.0–186.0)
Retic Ct Pct: 6.2 % — ABNORMAL HIGH (ref 0.4–3.1)

## 2023-12-14 LAB — BPAM RBC
Blood Product Expiration Date: 202505082359
ISSUE DATE / TIME: 202504081308
Unit Type and Rh: 7300

## 2023-12-14 LAB — GLUCOSE, CAPILLARY
Glucose-Capillary: 93 mg/dL (ref 70–99)
Glucose-Capillary: 168 mg/dL — ABNORMAL HIGH (ref 70–99)
Glucose-Capillary: 159 mg/dL — ABNORMAL HIGH (ref 70–99)
Glucose-Capillary: 149 mg/dL — ABNORMAL HIGH (ref 70–99)

## 2023-12-14 LAB — IRON AND TIBC
Iron: 47 ug/dL (ref 45–182)
Saturation Ratios: 14 % — ABNORMAL LOW (ref 17.9–39.5)
TIBC: 330 ug/dL (ref 250–450)
UIBC: 283 ug/dL

## 2023-12-14 MED ORDER — INSULIN GLARGINE-YFGN 100 UNIT/ML ~~LOC~~ SOLN
10.0000 [IU] | Freq: Every day | SUBCUTANEOUS | Status: DC
  Administered 2023-12-14 – 2023-12-16 (×3): 10 [IU] via SUBCUTANEOUS
  Filled 2023-12-14 (×3): qty 0.1

## 2023-12-14 NOTE — Plan of Care (Signed)
   Problem: Fluid Volume: Goal: Ability to maintain a balanced intake and output will improve Outcome: Progressing   Problem: Health Behavior/Discharge Planning: Goal: Ability to manage health-related needs will improve Outcome: Progressing   Problem: Metabolic: Goal: Ability to maintain appropriate glucose levels will improve Outcome: Progressing

## 2023-12-14 NOTE — Progress Notes (Addendum)
 PROGRESS NOTE    Mathew Baker  JXB:[id number] DOB: [birth year] DOA: 12/13/2023 PCP: Lauro Regulus, MD   82/M w hypertension, chronic systolic CHF, severe aortic stenosis, paroxysmal atrial fibrillation on chronic anticoagulation, COPD, T2DM, CVA, and hepatocellular carcinoma transferred from [facility] to [facility] on 3/18 for multivessel disease, underwent LHC 3/19 with PCI to X, DES to left main into circumflex, balloon pump, followed by staged PCI X2 to RCA on 3/24, discharged home on Plavix, statin, midodrine, also diagnosed with severe aortic stenosis, systolic CHF with EF 25-30% plan for TAVR eval, in addition had urinary retention, discharged with the Foley.  Presented to [facility] 3/31 with urinary retention and hematuria, hypotensive improved after fluid bolus, discharged on 4/3 after transfusion, Foley catheter removal etc. -Presented to [facility] ED 4/8 with worsening shortness of breath orthopnea, in the ED he was noted to be volume overloaded, EKG with known LBBB, A-fib, chest x-ray with small to moderate right effusion, BNP 540, troponin 520, 497, Hemoccult positive -Also reported black stools for few weeks  Subjective: Feels better today, breathing is improving  Assessment and Plan:  Acute on chronic systolic CHF Severe aortic stenosis -Last echocardiogram LVEF to be 25- 30% with grade 1 diastolic dysfunction, moderate dilated left atrium, severe aortic stenosis  -Improving with gentle diuresis, continue low-dose Lasix today -GDMT limited by hypotension, on midodrine -Conservative management -Follow-up with [facility] cardiology for TAVR eval -He is at risk of decompensation in the setting of complex multisystem issues, discussed CODE STATUS with the patient he is agreeable to DNR   Elevated troponin CAD status post PCI -recent admission at [facility] with multivessel disease where he had successful staged PCI to LAD and RCA.  High-sensitivity troponins 520-> 497 -do not suspect ACS at this  time -Continue aspirin, Plavix, statin   Acute on chronic anemia Melena -Recently transfused 2 units of PRBC, transfuse 1 unit PRBC yesterday  -Likely multifactorial, recent hematuria and low-grade GI bleed in the setting of antiplatelet and anticoagulant therapy  -Gastroenterology following, felt to be high risk for endoscopic eval at this time  -Continue PPI, monitor hemoglobin  -Check anemia panel, Eliquis on hold   Paroxysmal atrial fibrillation Patient appears relatively rate controlled at this time.  CHA2DS2-VASc score of 7.  -Holding Eliquis in the setting of above   Uncontrolled Diabetes Mellitus type 2 with hypoglycemia,  Last hemoglobin A1c noted to be 9.1.  Home medication regimen includes Lantus 50 units twice daily.   -Restart Lantus at a lower dose  Orthostatic hypotension Syncopal episodes Recurrent falls -Multifactorial in the setting of severe aortic stenosis, hypotension   BPH Patient reports voiding without any issue at this time. -Continue Flomax   History of tubular adenomas Last colonoscopy performed back in 2015 noted 2 tubular adenomas which were removed.   GERD -Protonix twice daily     DVT prophylaxis: SCDs Advance Care Planning: DNR Family Communication: Daughter at bedside Disposition May need rehab  Consultants:    Procedures:   Antimicrobials:    Objective: Vitals:   12/14/23 0052 12/14/23 0537 12/14/23 0739 12/14/23 1130  BP: (!) 119/92 (!) 96/52 119/67 109/78  Pulse: 77 82 66 78  Resp: 18 17 19 20   Temp: 97.8 F (36.6 C) (!) 97.5 F (36.4 C) 97.9 F (36.6 C) 98.3 F (36.8 C)  TempSrc: Oral Oral Oral Oral  SpO2: 93% 91% 95%   Weight:  109.1 kg    Height:        Intake/Output Summary (Last 24  hours) at 12/14/2023 1144 Last data filed at 12/14/2023 0854 Gross per 24 hour  Intake 504.83 ml  Output 500 ml  Net 4.83 ml   Filed Weights   12/13/23 0835 12/14/23 0537  Weight: 99.8 kg 109.1 kg    Examination:  General  exam: Appears calm and comfortable  HEENT: + JVD Respiratory system: Clear to auscultation Cardiovascular system: S1 & S2 heard, irregular rhythm, systolic murmur Abd: nondistended, soft and nontender.Normal bowel sounds heard. Central nervous system: Alert and oriented. No focal neurological deficits. Extremities: Trace edema Skin: No rashes Psychiatry:  Mood & affect appropriate.     Data Reviewed:   CBC: Recent Labs  Lab 12/08/23 0927 12/13/23 0910 12/14/23 1012  WBC 7.3 6.0 5.4  NEUTROABS 4.9 5.2  --   HGB 8.9* 7.0* 7.5*  HCT 27.4* 23.1* 24.7*  MCV 101.9* 106.9* 99.2  PLT 243 220 197   Basic Metabolic Panel: Recent Labs  Lab 12/13/23 0910 12/14/23 0231  NA 139 139  K 3.9 3.8  CL 107 107  CO2 24 27  GLUCOSE 62* 139*  BUN 29* 30*  CREATININE 1.00 1.03  CALCIUM 7.9* 7.8*   GFR: Estimated Creatinine Clearance: 70.4 mL/min (by C-G formula based on SCr of 1.03 mg/dL). Liver Function Tests: Recent Labs  Lab 12/13/23 0910  AST 31  ALT 21  ALKPHOS 56  BILITOT 1.0  PROT 5.3*  ALBUMIN 2.7*   No results for input(s): "LIPASE", "AMYLASE" in the last 168 hours. No results for input(s): "AMMONIA" in the last 168 hours. Coagulation Profile: No results for input(s): "INR", "PROTIME" in the last 168 hours. Cardiac Enzymes: No results for input(s): "CKTOTAL", "CKMB", "CKMBINDEX", "TROPONINI" in the last 168 hours. BNP (last 3 results) No results for input(s): "PROBNP" in the last 8760 hours. HbA1C: No results for input(s): "HGBA1C" in the last 72 hours. CBG: Recent Labs  Lab 12/13/23 1132 12/13/23 1425 12/13/23 2257 12/14/23 0625 12/14/23 1033  GLUCAP 95 130* 178* 93 168*   Lipid Profile: No results for input(s): "CHOL", "HDL", "LDLCALC", "TRIG", "CHOLHDL", "LDLDIRECT" in the last 72 hours. Thyroid Function Tests: No results for input(s): "TSH", "T4TOTAL", "FREET4", "T3FREE", "THYROIDAB" in the last 72 hours. Anemia Panel: No results for input(s):  "VITAMINB12", "FOLATE", "FERRITIN", "TIBC", "IRON", "RETICCTPCT" in the last 72 hours. Urine analysis:    Component Value Date/Time   COLORURINE RED (A) 12/06/2023 0428   APPEARANCEUR CLOUDY (A) 12/06/2023 0428   LABSPEC  12/06/2023 0428    TEST NOT REPORTED DUE TO COLOR INTERFERENCE OF URINE PIGMENT   PHURINE  12/06/2023 0428    TEST NOT REPORTED DUE TO COLOR INTERFERENCE OF URINE PIGMENT   GLUCOSEU (A) 12/06/2023 0428    TEST NOT REPORTED DUE TO COLOR INTERFERENCE OF URINE PIGMENT   HGBUR (A) 12/06/2023 0428    TEST NOT REPORTED DUE TO COLOR INTERFERENCE OF URINE PIGMENT   BILIRUBINUR (A) 12/06/2023 0428    TEST NOT REPORTED DUE TO COLOR INTERFERENCE OF URINE PIGMENT   KETONESUR (A) 12/06/2023 0428    TEST NOT REPORTED DUE TO COLOR INTERFERENCE OF URINE PIGMENT   PROTEINUR (A) 12/06/2023 0428    TEST NOT REPORTED DUE TO COLOR INTERFERENCE OF URINE PIGMENT   NITRITE (A) 12/06/2023 0428    TEST NOT REPORTED DUE TO COLOR INTERFERENCE OF URINE PIGMENT   LEUKOCYTESUR (A) 12/06/2023 0428    TEST NOT REPORTED DUE TO COLOR INTERFERENCE OF URINE PIGMENT   Sepsis Labs: @LABRCNTIP (procalcitonin:4,lacticidven:4)  ) Recent Results (from the past  240 hours)  Urine Culture     Status: None   Collection Time: 12/06/23  4:28 AM   Specimen: Urine, Random  Result Value Ref Range Status   Specimen Description   Final    URINE, RANDOM Performed at [facility], [address] [place], [place] [address]    Special Requests   Final    NONE Reflexed from [id number] Performed at [facility], [address], [place], [place] [address]    Culture   Final    NO GROWTH Performed at [facility], [address], [place], [place] [address]    Report Status 12/08/2023 FINAL  Final     Radiology Studies: DG Chest 2 View Result Date: 12/13/2023 CLINICAL DATA:  dyspnea EXAM: CHEST - 2 VIEW COMPARISON:  11/20/2023 FINDINGS: New small to moderate right pleural effusion. New  airspace disease at the right lung base. Some improved left retrocardiac aeration. Heart size and mediastinal contours are within normal limits. Aortic Atherosclerosis (ICD10-170.0). Visualized bones unremarkable. IMPRESSION: New small to moderate right pleural effusion and right basilar airspace disease. Electronically Signed   By: Corlis Leak M.D.   On: 12/13/2023 09:02     Scheduled Meds:  aspirin EC  81 mg Oral Daily   atorvastatin  80 mg Oral Daily   clopidogrel  75 mg Oral Daily   dextrose  1 ampule Intravenous Once   furosemide  20 mg Intravenous Daily   insulin aspart  0-15 Units Subcutaneous TID WC   midodrine  5 mg Oral BID   pantoprazole  40 mg Oral BID   sodium chloride flush  3 mL Intravenous Q12H   tamsulosin  0.4 mg Oral QHS   Continuous Infusions:   LOS: 1 day    Time spent:    Zannie Cove, MD [facility]   12/14/2023, 11:44 AM

## 2023-12-14 NOTE — Evaluation (Signed)
 Physical Therapy Evaluation Patient Details Name: Mathew Baker MRN: [id number] DOB: 12-04-1940 Today's Date: 12/14/2023  History of Present Illness  Mathew Baker is a 83 y.o. male admitted 12/13/23 for SOB and syncope. 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 Of note, recent hospitalizations 3/18 for multivessel disease staged PCI and 3/31-4/3 after being found to have hematuria with acute blood loss on Eliquis.   Clinical Impression  Pt admitted with above diagnosis. PTA, pt was modI for functional mobility using a rollator or RW. He reports utilizing an Art gallery manager in the community. Pt is modI with ADLs, drives, and manages his own medications. He resides with his wife in a one story house with a level entry. Pt reports at least 5 falls in the last 6 months and is considered a high fall risk. He reports strong family support available. Pt currently with functional limitations due to the deficits listed below (see PT Problem List). Examination limited secondary to symptomatic orthostatic hypotension. Pt transferred with CGA using RW. Unable to advance gait this session. Pt encouraged to sit in recliner chair to increase upright tolerance. Pt will benefit from acute skilled PT to increase his independence and safety with mobility to allow discharge. Recommend Home with HHPT to improve activity tolerance and decrease fall risk.    Blood Pressure Supine: 110/69 (83) Seated EOB: 99/49 (65) Seated following BLE exercises:111/69 (82) Standing: 116/55 (67)      If plan is discharge home, recommend the following: A little help with walking and/or transfers;A little help with bathing/dressing/bathroom;Assist for transportation;Assistance with cooking/housework    Can travel by private vehicle        Equipment Recommendations None recommended by PT (Pt already has DME)  Recommendations for Other Services       Functional Status Assessment Patient has had a recent decline in their functional status and demonstrates the ability to make significant improvements in function in a reasonable and predictable amount of time.     Precautions / Restrictions Precautions Precautions: Fall Recall of Precautions/Restrictions: Intact Precaution/Restrictions Comments: watch BP Restrictions Weight Bearing Restrictions Per Provider Order: No      Mobility  Bed Mobility Overal bed mobility: Needs Assistance Bed Mobility: Supine to Sit     Supine to sit: Supervision, HOB elevated     General bed mobility comments: Pt sat up on R side of bed with HOB elevated ~20deg, no use of bedrail, and no physical assistance or VC for sequencing. Pt scooted fwd with BUE support on bed to reach foot flat. He c/o dizziness upon sitting EOB, that lessened with time and following exercises.    Transfers Overall transfer level: Needs assistance Equipment used: Rolling walker (2 wheels) Transfers: Sit to/from Stand, Bed to chair/wheelchair/BSC Sit to Stand: Contact guard assist   Step pivot transfers: Contact guard assist       General transfer comment: Pt stood from lowest bed height. VC for proper hand positioning. Pt powered up with CGA for safety. Good eccentric control with sitting. Pt transferred to R from bed to recliner chair taking short small steps and navigating RW within a confined area. He c/o dizziness upon standing, that lessened with time.    Ambulation/Gait               General Gait Details: Deferred d/t  symptomatic orthostatic hypotension.  Stairs            Wheelchair Mobility     Tilt Bed    Modified Rankin (Stroke Patients Only)       Balance Overall balance assessment: Needs assistance Sitting-balance support: Feet  supported, No upper extremity supported Sitting balance-Leahy Scale: Fair Sitting balance - Comments: Pt sat EOB with supervision.   Standing balance support: Bilateral upper extremity supported, During functional activity, Reliant on assistive device for balance Standing balance-Leahy Scale: Poor Standing balance comment: Pt dependent on RW for stability. No overt LOB.                             Pertinent Vitals/Pain Pain Assessment Pain Assessment: No/denies pain    Home Living Family/patient expects to be discharged to:: Private residence Living Arrangements: Spouse/significant other Available Help at Discharge: Family;Available 24 hours/day Type of Home: House Home Access: Level entry       Home Layout: One level Home Equipment: Rollator (4 wheels);BSC/3in1;Electric scooter;Rolling Walker (2 wheels)      Prior Function Prior Level of Function : Independent/Modified Independent;Driving             Mobility Comments: Ambulates with rollator primarily. Uses electric scooter for community ambulation. Pt reports at least 5 falls in the last 79mo, unsure of the exact cause but remembers getting dizzy. ADLs Comments: ModI with ADLs. Drives. Retired from [employer]. Wife does household IADLs. Pt manages his meds.     Extremity/Trunk Assessment   Upper Extremity Assessment Upper Extremity Assessment: Overall WFL for tasks assessed (hx of stroke affecting R side, leading to weakness)    Lower Extremity Assessment Lower Extremity Assessment: Overall WFL for tasks assessed (hx of stroke affecting R side, leading to weakness)    Cervical / Trunk Assessment Cervical / Trunk Assessment: Normal  Communication   Communication Communication: No apparent difficulties    Cognition Arousal: Alert Behavior During Therapy: WFL for tasks assessed/performed   PT - Cognitive impairments: No apparent impairments                       PT - Cognition  Comments: Pt A,Ox4. Following commands: Intact       Cueing Cueing Techniques: Verbal cues     General Comments General comments (skin integrity, edema, etc.): Pt with BLE edema and JVD. BP: supine 110/69 (83); seated EOB 99/49 (65); seated following BLE exercises 111/69 (82); standing 116/55 (67). Pt c/o dizziness with all position changes, lessened with time. Encouraged pt to sit in recliner chair for ~2hrs.    Exercises     Assessment/Plan    PT Assessment Patient needs continued PT services  PT Problem List Cardiopulmonary status limiting activity;Decreased activity tolerance;Decreased balance;Decreased mobility       PT Treatment Interventions DME instruction;Gait training;Functional mobility training;Therapeutic activities;Therapeutic exercise    PT Goals (Current goals can be found in the Care Plan section)  Acute Rehab PT Goals Patient Stated Goal: Return Home PT Goal Formulation: With patient Time For Goal Achievement: 12/28/23 Potential to Achieve Goals: Good    Frequency Min 2X/week     Co-evaluation               AM-PAC PT "6 Clicks" Mobility  Outcome Measure Help needed turning from your back to your side while in a flat bed without using bedrails?: A Little Help needed moving from lying on your back  to sitting on the side of a flat bed without using bedrails?: A Little Help needed moving to and from a bed to a chair (including a wheelchair)?: A Little Help needed standing up from a chair using your arms (e.g., wheelchair or bedside chair)?: A Little Help needed to walk in hospital room?: A Lot Help needed climbing 3-5 steps with a railing? : A Lot 6 Click Score: 16    End of Session Equipment Utilized During Treatment: Gait belt Activity Tolerance: Treatment limited secondary to medical complications (Comment) (symptomatic orthostatic hypotension) Patient left: in chair;with call bell/phone within reach Nurse Communication: Mobility status;Other  (comment) (Symptomatic Orthostatic Hypotension) PT Visit Diagnosis: Unsteadiness on feet (R26.81);Difficulty in walking, not elsewhere classified (R26.2);Other abnormalities of gait and mobility (R26.89)    Time: 1200-1220 PT Time Calculation (min) (ACUTE ONLY): 20 min   Charges:   PT Evaluation $PT Eval Moderate Complexity: 1 Mod   PT General Charges $$ ACUTE PT VISIT: 1 Visit         Cheri Guppy, PT, DPT Acute Rehabilitation Services Office: [phone] Secure Chat Preferred  Richardson Chiquito 12/14/2023, 1:20 PM

## 2023-12-14 NOTE — Progress Notes (Signed)
 Heart Failure Navigator Progress Note  Assessed for Heart & Vascular TOC clinic readiness.  Patient does not meet criteria due to a patient with [facility] cardiology . No HF TOC.,  Navigator will sign off at this time.   Rhae Hammock, BSN, Scientist, clinical (histocompatibility and immunogenetics) Only

## 2023-12-14 NOTE — TOC Initial Note (Signed)
 Transition of Care [facility]) - Initial/Assessment Note    Patient Details  Name: Mathew Baker MRN: [id number] Date of Birth: 08/02/1941  [facility] CM/SW Contact:    Michaela Corner, LCSWA Phone Number: 12/14/2023, 1:40 PM  Clinical Narrative:   9:25AM Unit director notified CSW and RNCM that family would like to speak with TOC. CSW called patients dtr, Lurena Joiner, at 11:13AM. Lurena Joiner stated that patient will need SNF. CSW explained that patient had not been seen by PT/OT at the time and their notes are needed to complete FL2.   PT rec'd HH at this time. CSW informed PT of conversation with Lurena Joiner.   TOC will continue to follow.     Expected Discharge Plan:  (HH vs SNF) Barriers to Discharge: Continued Medical Work up   Patient Goals and CMS Choice Patient states their goals for this hospitalization and ongoing recovery are:: Unable to assess          Expected Discharge Plan and Services In-house Referral: Clinical Social Work     Living arrangements for the past 2 months: Single Family Home                             HH Agency: [facility] Date HH Agency Contacted: 12/08/23   Representative spoke with at [facility] Agency: Shaun  Prior Living Arrangements/Services Living arrangements for the past 2 months: Single Family Home Lives with:: Spouse Patient language and need for interpreter reviewed:: Yes Do you feel safe going back to the place where you live?: Yes      Need for Family Participation in Patient Care: No (Comment) Care giver support system in place?: Yes (comment) Current home services: DME (walker, cane) Criminal Activity/Legal Involvement Pertinent to Current Situation/Hospitalization: No - Comment as needed  Activities of Daily Living   ADL Screening (condition at time of admission) Independently performs ADLs?: No Does the patient have a NEW difficulty with bathing/dressing/toileting/self-feeding that is expected  to last >3 days?: No (needs assist) Does the patient have a NEW difficulty with getting in/out of bed, walking, or climbing stairs that is expected to last >3 days?: No (needs assist up with cane or walker) Does the patient have a NEW difficulty with communication that is expected to last >3 days?: No Is the patient deaf or have difficulty hearing?: No Does the patient have difficulty seeing, even when wearing glasses/contacts?: No Does the patient have difficulty concentrating, remembering, or making decisions?: No  Permission Sought/Granted Permission sought to share information with : Family Supports Permission granted to share information with : Yes, Verbal Permission Granted  Share Information with NAME: Lurena Joiner     Permission granted to share info w Relationship: Daughter  Permission granted to share info w Contact Information: [phone]  Emotional Assessment Appearance:: Appears stated age Attitude/Demeanor/Rapport: Unable to Assess Affect (typically observed): Unable to Assess Orientation: : Oriented to  Time, Oriented to Situation, Oriented to Place, Oriented to Self Alcohol / Substance Use: Not Applicable Psych Involvement: No (comment)  Admission diagnosis:  Hypoglycemia [E16.2] HFrEF (heart failure with reduced ejection fraction) (HCC) [I50.20] Symptomatic anemia [D64.9] Acute on chronic systolic (congestive) heart failure (HCC) [I50.23] Patient Active Problem List   Diagnosis Date Noted   S/P coronary artery stent placement 12/14/2023   HFrEF (heart failure with reduced ejection fraction) (HCC) 12/13/2023   Nonrheumatic aortic valve stenosis 12/13/2023   CAD S/P percutaneous coronary angioplasty 12/13/2023   Symptomatic  anemia 12/13/2023   GI bleed 12/13/2023   BPH (benign prostatic hyperplasia) 12/13/2023   Tubular adenoma of colon 12/13/2023   GERD (gastroesophageal reflux disease) 12/13/2023   Ischemic cardiomyopathy 12/13/2023   Demand ischemia (HCC)  12/13/2023   Elevated troponin level not due myocardial infarction 12/13/2023   Atrial fibrillation with controlled ventricular rate (HCC) 12/13/2023   Near syncope 12/13/2023   Hematuria 12/06/2023   NSTEMI (non-ST elevated myocardial infarction) (HCC) 11/21/2023   Acute on chronic systolic (congestive) heart failure (HCC) 11/21/2023   COPD (chronic obstructive pulmonary disease) (HCC) 11/21/2023   Paroxysmal atrial fibrillation (HCC) 11/21/2023   Hepatocellular carcinoma (HCC) 06/15/2023   Thrush 05/22/2023   History of stroke 05/22/2023   Acute respiratory failure with hypoxia (HCC) 05/21/2023   Lobar pneumonia (HCC) 05/21/2023   AKI (acute kidney injury) (HCC) 05/21/2023   Obesity (BMI 30-39.9) 05/21/2023   Hypokalemia 05/21/2023   Acute cholecystitis 05/19/2023   Iron deficiency anemia 03/24/2021   Unspecified open-angle glaucoma, indeterminate stage 03/24/2021   COVID-19 virus infection 09/09/2020   Syncope 09/09/2020   Elevated troponin 09/09/2020   Atrial fibrillation, chronic (HCC) 09/09/2020   Orthostatic hypotension 09/09/2020   Bilateral carotid artery disease (HCC) 06/07/2019   Uses walker 06/07/2019   Bradycardia 03/19/2019   Chest pain 12/20/2016   HTN (hypertension) 12/20/2016   HLD (hyperlipidemia) 12/20/2016   Syncope and collapse 12/20/2016   Uncontrolled type 2 diabetes mellitus with microalbuminuria 08/21/2014   Uncontrolled type 2 diabetes mellitus with hyperglycemia, with long-term current use of insulin (HCC) 08/21/2014   PCP:  Lauro Regulus, MD Pharmacy:   [facility] - [place], [place] - [address] [place] [address] Phone: [phone] Fax: [phone]  [id number] [place], [place] - [address] [place] [address] Phone: [phone] Fax: [phone]     Social Drivers of Health (SDOH) Social History: SDOH Screenings   Food  Insecurity: No Food Insecurity (12/13/2023)  Housing: Low Risk  (12/13/2023)  Transportation Needs: No Transportation Needs (12/13/2023)  Utilities: Not At Risk (12/13/2023)  Financial Resource Strain: Low Risk  (11/26/2023)   Received from [facility]  Social Connections: Moderately Isolated (12/13/2023)  Tobacco Use: Medium Risk (12/13/2023)   SDOH Interventions:     Readmission Risk Interventions     No data to display

## 2023-12-14 NOTE — Progress Notes (Signed)
 Rounding Note    Patient Name: Mathew Baker Date of Encounter: 12/14/2023  [facility] Cardiologist: None  Chief Complaint:  Chief Complaint  Patient presents with   Shortness of Breath   Reason of consult: history of NSTEMI/recent coronary intervention and ? syncope.   Subjective   Denies anginal chest pain. Shortness of breath improving. Daughter at bedside. Morning CBC pending.  Inpatient Medications    Scheduled Meds:  aspirin EC  81 mg Oral Daily   atorvastatin  80 mg Oral Daily   clopidogrel  75 mg Oral Daily   dextrose  1 ampule Intravenous Once   furosemide  20 mg Intravenous Daily   insulin aspart  0-15 Units Subcutaneous TID WC   midodrine  5 mg Oral BID   pantoprazole  40 mg Oral BID   sodium chloride flush  3 mL Intravenous Q12H   tamsulosin  0.4 mg Oral QHS   Continuous Infusions:  PRN Meds: acetaminophen **OR** acetaminophen, albuterol, dextrose   Vital Signs    Vitals:   12/13/23 2007 12/14/23 0052 12/14/23 0537 12/14/23 0739  BP: (!) 107/56 (!) 119/92 (!) 96/52 119/67  Pulse: 77 77 82 66  Resp: 17 18 17 19   Temp: 98.3 F (36.8 C) 97.8 F (36.6 C) (!) 97.5 F (36.4 C) 97.9 F (36.6 C)  TempSrc: Oral Oral Oral Oral  SpO2: 94% 93% 91% 95%  Weight:   109.1 kg   Height:        Intake/Output Summary (Last 24 hours) at 12/14/2023 0916 Last data filed at 12/14/2023 0854 Gross per 24 hour  Intake 504.83 ml  Output 500 ml  Net 4.83 ml      12/14/2023    5:37 AM 12/13/2023    8:35 AM 12/07/2023    5:00 AM  Last 3 Weights  Weight (lbs) 240 lb 8.4 oz 220 lb 206 lb 2.1 oz  Weight (kg) 109.1 kg 99.791 kg 93.5 kg      Telemetry    Atrial fibrillation with controlled ventricular rate- Personally Reviewed  ECG    No new tracing- Personally Reviewed  Physical Exam   Physical Exam  Constitutional: No distress. He appears chronically ill.  hemodynamically stable  Neck: JVD present.  Cardiovascular: Normal rate, S1 normal  and S2 normal. An irregularly irregular rhythm present. Exam reveals no gallop, no S3 and no S4.  Murmur heard. Systolic murmur is present with a grade of 3/6 at the upper right sternal border. Holosystolic  murmur of grade 3/6 is also present at the apex. Pulses:      Carotid pulses are  on the right side with bruit and  on the left side with bruit. Pulmonary/Chest: Effort normal and breath sounds normal. No stridor. He has no wheezes. He has no rales.  Musculoskeletal:        General: Edema present.     Cervical back: Neck supple.  Skin: Skin is warm.   Labs    High Sensitivity Troponin:   Recent Labs  Lab 11/19/23 0836 12/05/23 1951 12/06/23 0313 12/13/23 0910 12/13/23 1050  TROPONINIHS >24,000* 116* 109* 520* 497*     Chemistry Recent Labs  Lab 12/13/23 0910 12/14/23 0231  NA 139 139  K 3.9 3.8  CL 107 107  CO2 24 27  GLUCOSE 62* 139*  BUN 29* 30*  CREATININE 1.00 1.03  CALCIUM 7.9* 7.8*  PROT 5.3*  --   ALBUMIN 2.7*  --   AST 31  --  ALT 21  --   ALKPHOS 56  --   BILITOT 1.0  --   GFRNONAA >60 >60  ANIONGAP 8 5    Lipids No results for input(s): "CHOL", "TRIG", "HDL", "LABVLDL", "LDLCALC", "CHOLHDL" in the last 168 hours.  Hematology Recent Labs  Lab 12/08/23 0927 12/13/23 0910  WBC 7.3 6.0  RBC 2.69* 2.16*  HGB 8.9* 7.0*  HCT 27.4* 23.1*  MCV 101.9* 106.9*  MCH 33.1 32.4  MCHC 32.5 30.3  RDW 21.9* 22.0*  PLT 243 220   Thyroid No results for input(s): "TSH", "FREET4" in the last 168 hours.  BNP Recent Labs  Lab 12/13/23 0910  BNP 540.4*    DDimer No results for input(s): "DDIMER" in the last 168 hours.   Radiology    NA  Cardiac Studies   LHC 11/21/2023   Prox RCA lesion is 75% stenosed.   Mid RCA-1 lesion is 75% stenosed.   Mid RCA-2 lesion is 75% stenosed.   Mid RCA to Dist RCA lesion is 90% stenosed.   Dist RCA lesion is 50% stenosed with 50% stenosed side branch in RPAV.   Ost RCA to Prox RCA lesion is 50% stenosed.   Ost LM  to Mid LM lesion is 70% stenosed.   Dist LM lesion is 90% stenosed.   Ost Cx lesion is 100% stenosed.   Ost LAD to Prox LAD lesion is 95% stenosed.   Prox LAD to Dist LAD lesion is 50% stenosed.   2nd Diag lesion is 75% stenosed.   There is moderate to severe left ventricular systolic dysfunction.   LV end diastolic pressure is moderately elevated.   The left ventricular ejection fraction is 25-35% by visual estimate.   Anticipated discharge date to be determined.   Recommend to resume Apixaban, at currently prescribed dose and frequency.   Conclusion Left heart cath right radial approach Non-STEMI shortness of breath   Left ventriculogram Left ventricular enlargement global hypokinesis EF around 30%   Coronaries Left main large moderate to severe calcification 70% distal LAD large 90% ostial 95% proximal diffuse 50% moderate to severe calcification proximally Circumflex 100% occluded ostially moderate to severe calcification RCA large serial 75% lesions mid heavy calcification 90% mid to distal moderate calcification right dominant system moderate disease diffusely distally   Intervention deferred Recommend evaluation for high risk coronary bypass surgery versus high risk PCI and stent for complex disease Medical therapy is also not unreasonable Will discuss images with tertiary care facility   Echocardiogram 11/19/2023 IMPRESSIONS  1. Left ventricular ejection fraction, by estimation, is 25 to 30%. The  left ventricle has severely decreased function. The left ventricle  demonstrates global hypokinesis. There is mild concentric left ventricular  hypertrophy. Left ventricular diastolic   parameters are consistent with Grade I diastolic dysfunction (impaired  relaxation). Elevated left ventricular end-diastolic pressure.   2. Right ventricular systolic function is normal. The right ventricular  size is normal. There is normal pulmonary artery systolic pressure.   3. Left atrial  size was moderately dilated.   4. The mitral valve is normal in structure. Mild mitral valve  regurgitation. No evidence of mitral stenosis.   5. Dimensionless index in 0.23, consistent with severe aortic stenosis.  The severity of the aortic stenosis is likely underestimated by mean  gradient due to low flow caused by systolic dysfunction.. The aortic valve  is calcified. There is severe  calcifcation of the aortic valve. There is severe thickening of the aortic  valve. Aortic  valve regurgitation is mild to moderate. Severe aortic valve  stenosis. Aortic valve mean gradient measures 21.0 mmHg. Aortic valve Vmax  measures 2.95 m/s.   6. The inferior vena cava is dilated in size with <50% respiratory  variability, suggesting right atrial pressure of 15 mmHg.    PCI 3/19 -- [facility] Planned PCI of the LAD and LM. Patient known to have severe AS and had repeat [facility] echo to evaluate. Placed a 50cc IABP at the beginning of the case via ultrasound guidance for support. Obtained right radial artery access. Primary wired the LM into LAD  with a rota floppy wire. Performed 4 runs of rotational atherectomy with a 1.5 burr. Post-dilated with a 2.5 then a 3.0 x 20 [place]. IVUS revealed severe concentric calcification with fractured calcium and distal diameter of 3.0, Stent with a 3.0 x 38  Synergy overlapped proximally with a  4.0 x 16 [place], and post-dilated with a 3.0 and 4.0 [place]. IABP sutured in placed.   Recommendations:  Successful PCI of the LM into LCx. Plan for RCA PCI on Friday with Dr. Yetta Barre. Recommend leaving IABP in place if tolerated until PCI Friday. DAPT for a year if tolerated. Begin evaluation for TAVR.    PCI 3/24 -- [facility] Severely calcified RCA. IABP removed over the weekend, so replaced it for the PCI today via left common femoral artery. Primary wired the RCA with the viper wire. Performed 5 runs of orbital atherectomy to the distal and proximal RCA. Post-dilated with a  2.5 and 3.0 [place].  IVUS demonstrated distal reference diameter of 3.2-3.4 so placed a 3.0 x 38 Synergy stent overlapped with a 3.5 x38 Synergy stent, and post-dilated with a 3.5 [place] and 4.0 [address]. IABP removed at the end of the case and perclosed.  Recommendations: DAPT for at least a year. Evaluation of aortic stenosis for need for TAVR. Further management per primary team.  Patient Profile     83 y.o. male  with a hx of CAD s/p NSTEMI 11/2023 s/p multivessel PCI, HTN, HLD, severe low-flow, low-gradient AS, chronic HFrEF, diabetes, COPD, history of CVA, atrial fibrillation, orthostatic hypotension, hepatocellular carcinoma (treated  in October 2023), BPH, urinary retention.  Assessment & Plan    Impression:  Acute on chronic heart failure with reduced EF  Ischemic cardiomyopathy Recent NSTEMI status post multivessel PCI Near-syncope/falls Melanotic stools, gross hematuria secondary to traumatic Foley/positive fecal occult blood/status post PRBCs/suspected GI bleed Elevated troponins, demand ischemia in the setting of cardiomyopathy, recent NSTEMI, and suspected GI bleed Aortic stenosis, low-flow low gradient Atrial fibrillation, controlled ventricular rate History of orthostatic hypotension, on midodrine Uncontrolled diabetes mellitus type 2-presents with hypoglycemia History of tubular adenoma   Recommendations:  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 EKG nonischemic. Troponins above normal limits but likely secondary to demand ischemia. Patient underwent complex PCI to the left main/LCx and most recent to the RCA on November 28, 2023. Recommend at least dual antiplatelet therapy for 1 month from his last stent on November 28, 2023 to decrease chance of stent thrombosis. Spoke to interventional colleagues as  well regarding his case who agree that given the recent ACS and multivessel PCI w/ unprotected left main 1 month of DAPT is reasonable if Hb remains stable. And if he continues to drop his Hb despite holding OAC than monotherapy  w/ Plavix can also be considered.  After 30-days of DAPT from his last stent (11/28/2023) can discontinue ASA and continue only Plavix for CAD.  Uptitration of GDMT has been difficult due to hypotension, as per [facility] records in Care Everywhere.   Near-syncope/falls Multifactorial: Baseline orthostatic hypotension, hypoglycemia on arrival blood glucose 43, suspected GI bleed/anemia, deconditioning, history of falls, and underlying valvular heart disease in the setting of cardiomyopathy, etc. Continue midodrine, at home dose Check orthostatic vital signs -  not done will reorder  Diabetes/hyperglycemia management per primary team Suspected GI bleed/anemia: GI following Gross hematuria secondary to traumatic Foley status post fall Aortic stenosis-being considered for TAVR at [facility] as outpatient   Symptomatic anemia: melanotic stools, gross hematuria secondary to traumatic Foley/positive fecal occult blood/status post PRBCs/suspected GI bleed GI recommendations reviewed - they feel he is high risk for endoscopy and recommending conservative management at this time.  Status post 2 units of PRBCs while at [facility] Status post 1 unit of PRBC in the ER during this hospitalization Morning CBC pending.    Aortic stenosis, low-flow low gradient: Being considered for TAVR workup as outpatient at [facility]   Atrial fibrillation, controlled ventricular rate: Currently not on AV nodal blocking agents due to orthostasis and hypotension in the past, per EMR Not on antiarrhythmic medications.  Was placed on Eliquis for thromboembolic prophylaxis.  However has required a total of 3 units of PRBCs in the recent past and the suspected source(s) either GI or GU due to traumatic foley.   His  high CHA2DS2-VASc SCORE does place him at higher risk for thromboembolic event.  However in the setting of suspected GI/GU bleed and anemia & blood transfusion I had a long discussion with patient and his daughter Lurena Joiner regarding antiplatelets and anticoagulation management.     After discussing the risks, benefits, alternatives to oral anticoagulation and antiplatelets both patient and Lurena Joiner participated in a shared decision-making process to continue antiplatelets given his recent NSTEMI and undergoing multivessel PCI to left main/Lcx and RCA places and to monitor H&H.   Shared decision is to stop Eliquis for now. Re-trail with OAC could be considered after bleeding sources are evaluated/corrected and hemoglobin stabilizes or outpatient workup for left atrial appendage occlusion device.    They understand that not being on anticoagulation predisposes him to possible thromboembolic events (i.e. stroke)and therefore needs to be more cognizant and to seek medical attention if such symptoms arise.   He will need close follow up w/ cardiology, GI, and PCP post discharge.    History of orthostatic hypotension, on midodrine   Uncontrolled diabetes mellitus type 2-presents with hypoglycemia A1c 9.1, as of 11/2023 Was hypoglycemic on arrival. Diabetes management per primary   History of tubular adenoma: Management per primary and GI service  Primary team to discuss goals of care and code status with patient and family. Will follow the patient with you.    For questions or updates, please contact [facility] Please consult [email or web address] for contact info under     Total time spent: 55 minutes.  Both primary attending and I spoke to the patient and daughter during morning rounds given the complexity of his care and co-morbid conditions.   Signed, Tessa Lerner, DO, [facility]  [facility]  [address] [place], [place] [address] Pager: [phone] Office:  [phone] 12/14/2023, 9:16 AM

## 2023-12-15 DIAGNOSIS — I255 Ischemic cardiomyopathy: Secondary | ICD-10-CM | POA: Diagnosis not present

## 2023-12-15 DIAGNOSIS — I5023 Acute on chronic systolic (congestive) heart failure: Secondary | ICD-10-CM | POA: Diagnosis not present

## 2023-12-15 DIAGNOSIS — I2510 Atherosclerotic heart disease of native coronary artery without angina pectoris: Secondary | ICD-10-CM | POA: Diagnosis not present

## 2023-12-15 DIAGNOSIS — I5020 Unspecified systolic (congestive) heart failure: Secondary | ICD-10-CM | POA: Diagnosis not present

## 2023-12-15 DIAGNOSIS — D649 Anemia, unspecified: Secondary | ICD-10-CM | POA: Diagnosis not present

## 2023-12-15 LAB — GLUCOSE, CAPILLARY
Glucose-Capillary: 99 mg/dL (ref 70–99)
Glucose-Capillary: 144 mg/dL — ABNORMAL HIGH (ref 70–99)
Glucose-Capillary: 208 mg/dL — ABNORMAL HIGH (ref 70–99)
Glucose-Capillary: 140 mg/dL — ABNORMAL HIGH (ref 70–99)

## 2023-12-15 LAB — BASIC METABOLIC PANEL WITH GFR
Anion gap: 6 (ref 5–15)
BUN: 29 mg/dL — ABNORMAL HIGH (ref 8–23)
CO2: 27 mmol/L (ref 22–32)
Calcium: 8 mg/dL — ABNORMAL LOW (ref 8.9–10.3)
Chloride: 106 mmol/L (ref 98–111)
Creatinine, Ser: 0.92 mg/dL (ref 0.61–1.24)
GFR, Estimated: >60 mL/min (ref >60)
Glucose, Bld: 107 mg/dL — ABNORMAL HIGH (ref 70–99)
Potassium: 3.7 mmol/L (ref 3.5–5.1)
Sodium: 139 mmol/L (ref 135–145)

## 2023-12-15 LAB — CBC
HCT: 24.6 % — ABNORMAL LOW (ref 39.0–52.0)
Hemoglobin: 7.7 g/dL — ABNORMAL LOW (ref 13.0–17.0)
MCH: 30.9 pg (ref 26.0–34.0)
MCHC: 31.3 g/dL (ref 30.0–36.0)
MCV: 98.8 fL (ref 80.0–100.0)
Platelets: 190 10*3/uL (ref 150–400)
RBC: 2.49 MIL/uL — ABNORMAL LOW (ref 4.22–5.81)
RDW: 27.7 % — ABNORMAL HIGH (ref 11.5–15.5)
WBC: 5.5 10*3/uL (ref 4.0–10.5)
nRBC: 0 % (ref 0.0–0.2)

## 2023-12-15 MED ORDER — MIDODRINE HCL 5 MG PO TABS
10.0000 mg | ORAL_TABLET | Freq: Two times a day (BID) | ORAL | Status: DC
  Administered 2023-12-15 – 2023-12-21 (×12): 10 mg via ORAL
  Filled 2023-12-15 (×12): qty 2

## 2023-12-15 MED ORDER — IRON SUCROSE 400 MG IVPB - SIMPLE MED
400.0000 mg | Freq: Once | Status: AC
Start: 2023-12-15 — End: 2023-12-15
  Administered 2023-12-15: 400 mg via INTRAVENOUS
  Filled 2023-12-15: qty 400

## 2023-12-15 MED ORDER — SODIUM CHLORIDE 0.9 % IV SOLN: 100.0000 mg | INTRAVENOUS | Status: DC

## 2023-12-15 MED ORDER — FUROSEMIDE 10 MG/ML IJ SOLN
40.0000 mg | Freq: Every day | INTRAMUSCULAR | Status: DC
  Administered 2023-12-16 – 2023-12-17 (×2): 40 mg via INTRAVENOUS
  Filled 2023-12-15 (×2): qty 4

## 2023-12-15 NOTE — Evaluation (Signed)
 Occupational Therapy Evaluation Patient Details Name: Mathew Baker MRN: [id number] DOB: 1941-02-11 Today's Date: 12/15/2023   History of Present Illness   Mathew Baker is a 83 y.o. male admitted 12/13/23 for SOB and syncope. 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 Of note, recent hospitalizations 3/18 for multivessel disease staged PCI and 3/31-4/3 after being found to have hematuria with acute blood loss on Eliquis.     Clinical Impressions Patient admitted for the diagnosis above.  PTA he lives at home with his spouse.  Recent admission for orthostatic hypotension.  Patient currently needing up to Mod A for lower body ADL from a sit to stand level, and CGA for short bouts in in room mobility.  Patient limited by SOB, poor activity tolerance and poor dynamic balance.  OT will follow in the acute setting and Patient will benefit from continued inpatient follow up therapy, <3 hours/day.     If plan is discharge home, recommend the following:   A little help with walking and/or transfers;A little help with bathing/dressing/bathroom;Assistance with cooking/housework;Assist for transportation     Functional Status Assessment   Patient has had a recent decline in their functional status and demonstrates the ability to make significant improvements in function in a reasonable and predictable amount of time.     Equipment Recommendations   Tub/shower seat     Recommendations for Other Services         Precautions/Restrictions   Precautions Precautions: Fall Recall of Precautions/Restrictions: Intact Precaution/Restrictions Comments: watch BP Restrictions Weight Bearing Restrictions Per Provider Order: No     Mobility Bed  Mobility Overal bed mobility: Needs Assistance Bed Mobility: Sit to Supine       Sit to supine: Contact guard assist     Patient Response: Cooperative  Transfers Overall transfer level: Needs assistance Equipment used: Rolling walker (2 wheels) Transfers: Sit to/from Stand, Bed to chair/wheelchair/BSC Sit to Stand: Contact guard assist     Step pivot transfers: Contact guard assist            Balance Overall balance assessment: Needs assistance Sitting-balance support: Feet supported, Single extremity supported Sitting balance-Leahy Scale: Good     Standing balance support: Reliant on assistive device for balance Standing balance-Leahy Scale: Poor                             ADL either performed or assessed with clinical judgement   ADL Overall ADL's : Needs assistance/impaired Eating/Feeding: Independent;Sitting   Grooming: Wash/dry hands;Wash/dry face;Supervision/safety;Standing   Upper Body Bathing: Set up;Sitting   Lower Body Bathing: Minimal assistance;Sit to/from stand   Upper Body Dressing : Set up;Sitting   Lower Body Dressing: Moderate assistance;Sit to/from stand   Toilet Transfer: Contact guard assist;Rolling walker (2 wheels);BSC/3in1;Ambulation                   Vision Patient Visual Report: No change from baseline       Perception Perception: Not tested       Praxis Praxis: Not tested       Pertinent Vitals/Pain Pain Assessment Pain Assessment: No/denies pain     Extremity/Trunk Assessment Upper Extremity Assessment Upper Extremity Assessment: Overall WFL for tasks assessed   Lower Extremity Assessment Lower Extremity Assessment: Defer to  PT evaluation   Cervical / Trunk Assessment Cervical / Trunk Assessment: Normal   Communication Communication Communication: No apparent difficulties Factors Affecting Communication: Hearing impaired   Cognition Arousal: Alert Behavior During Therapy: WFL for tasks  assessed/performed Cognition: No apparent impairments                               Following commands: Intact       Cueing  General Comments   Cueing Techniques: Verbal cues  Pt's BLE edema is increasing RLE>LLE. He has +2 pitting edema in RLE. BP: seated EOB 92/57 (65), seated on BSC following transfer 73/44 (54), seated on recliner chair following short gait bout 77/45 (56), end of session seated in recliner chair with feet raised 105/70 (81). Pt c/o intermittent dizziness. Pt's daughter and granddaughter present throughout session. They provided further insight into the deconditioning the pt has experienced and reported that there is limited family support available. Daughter states that the pt's wife is unable to physical assist the pt with any mobility and that her husband has had to come pick up the pt when he has fallen at home.   Exercises     Shoulder Instructions      Home Living Family/patient expects to be discharged to:: Private residence Living Arrangements: Spouse/significant other Available Help at Discharge: Family;Available 24 hours/day Type of Home: House Home Access: Level entry     Home Layout: One level     Bathroom Shower/Tub: Producer, television/film/video: Standard Bathroom Accessibility: Yes How Accessible: Accessible via walker Home Equipment: Rollator (4 wheels);BSC/3in1;Electric scooter;Rolling Walker (2 wheels)   Additional Comments: Pt's daughter lives 1 mile away.      Prior Functioning/Environment Prior Level of Function : Independent/Modified Independent;Driving               ADLs Comments: ModI with ADLs. Drives. Retired from [employer]. Wife does household IADLs. Pt manages his meds.    OT Problem List: Decreased activity tolerance;Impaired balance (sitting and/or standing)   OT Treatment/Interventions: Self-care/ADL training;Therapeutic exercise;Therapeutic activities;Patient/family education      OT  Goals(Current goals can be found in the care plan section)   Acute Rehab OT Goals Patient Stated Goal: Return home OT Goal Formulation: With patient Time For Goal Achievement: 12/29/23 Potential to Achieve Goals: Good ADL Goals Pt Will Perform Grooming: with modified independence;standing Pt Will Perform Lower Body Dressing: with modified independence;sit to/from stand Pt Will Transfer to Toilet: with modified independence;regular height toilet;ambulating   OT Frequency:  Min 2X/week    Co-evaluation              AM-PAC OT "6 Clicks" Daily Activity     Outcome Measure Help from another person eating meals?: None Help from another person taking care of personal grooming?: None Help from another person toileting, which includes using toliet, bedpan, or urinal?: A Little Help from another person bathing (including washing, rinsing, drying)?: A Little Help from another person to put on and taking off regular upper body clothing?: None Help from another person to put on and taking off regular lower body clothing?: A Lot 6 Click Score: 20   End of Session Equipment Utilized During Treatment: Rolling walker (2 wheels) Nurse Communication: Mobility status  Activity Tolerance: Patient tolerated treatment well Patient left: in bed;with call bell/phone within reach  OT Visit Diagnosis: History of falling (Z91.81)  Time: 1440-1505 OT Time Calculation (min): 25 min Charges:  OT General Charges $OT Visit: 1 Visit OT Evaluation $OT Eval Moderate Complexity: 1 Mod OT Treatments $Self Care/Home Management : 8-22 mins  12/15/2023  RP, OTR/L  Acute Rehabilitation Services  Office:  [phone]   Suzanna Obey 12/15/2023, 3:16 PM

## 2023-12-15 NOTE — Progress Notes (Addendum)
 PROGRESS NOTE    Mathew Baker  UJW:[id number] DOB: 12/18/1940 DOA: 12/13/2023 PCP: Lauro Regulus, MD   82/M w hypertension, chronic systolic CHF, severe aortic stenosis, paroxysmal atrial fibrillation on chronic anticoagulation, COPD, T2DM, CVA, and hepatocellular carcinoma transferred from [facility] to [facility] on 3/18 for multivessel disease, underwent LHC 3/19 with PCI to X, DES to left main into circumflex, balloon pump, followed by staged PCI X2 to RCA on 3/24, discharged home on Plavix, statin, midodrine, also diagnosed with severe aortic stenosis, systolic CHF with EF 25-30% plan for TAVR eval, in addition had urinary retention, discharged with the Foley.  Presented to [facility] 3/31 with urinary retention and hematuria, hypotensive improved after fluid bolus, discharged on 4/3 after transfusion, Foley catheter removal etc. -Presented to [facility] ED 4/8 with worsening shortness of breath orthopnea, in the ED he was noted to be volume overloaded, EKG with known LBBB, A-fib, chest x-ray with small to moderate right effusion, BNP 540, troponin 520, 497, Hemoccult positive -Also reported black stools for few weeks  Subjective: Feels fair, breathing a little better  Assessment and Plan:  Acute on chronic systolic CHF Severe aortic stenosis -Last echocardiogram LVEF to be 25- 30% with grade 1 diastolic dysfunction, moderate dilated left atrium, severe aortic stenosis  -Improving with gentle diuresis, continue low-dose IV Lasix today -GDMT limited by hypotension, on midodrine> dose increased -Conservative management -Follow-up with [facility] cardiology for TAVR eval -Increase activity   Elevated troponin CAD status post PCI -recent admission at [facility] with multivessel disease where he had successful staged PCI to LAD and RCA.  High-sensitivity troponins 520-> 497 -do not suspect ACS at this time -Continue aspirin, Plavix, statin   Acute on chronic anemia Melena -Recent admission transfused 2U PRBC,  then transfused 1 unit PRBC 4/8 -Likely multifactorial, recent hematuria and low-grade GI bleed in the setting of antiplatelet and anticoagulant therapy  -Gastroenterology following, felt to be high risk for endoscopic eval at this time, ongoing low-grade melena reported, mild downtrend in hemoglobin -Continue PPI, Eliquis on hold, add IV iron   Paroxysmal atrial fibrillation Patient appears relatively rate controlled at this time.  CHA2DS2-VASc score of 7.  -Holding Eliquis in the setting of above   Uncontrolled Diabetes Mellitus type 2 with hypoglycemia,  Last hemoglobin A1c noted to be 9.1.  Home medication regimen includes Lantus 50 units twice daily.   -Continue low dose Lantus  Orthostatic hypotension Syncopal episodes Recurrent falls -Multifactorial in the setting of severe aortic stenosis, hypotension -Continue midodrine   BPH Patient reports voiding without any issue at this time. -Continue Flomax   History of tubular adenomas Last colonoscopy performed back in 2015 noted 2 tubular adenomas which were removed.   GERD -Protonix twice daily     DVT prophylaxis: SCDs Advance Care Planning: DNR Family Communication: Daughter at bedside yesterday Disposition Home with home health services, possibly 48 hours if stable  Consultants:    Procedures:   Antimicrobials:    Objective: Vitals:   12/15/23 0335 12/15/23 0721 12/15/23 0900 12/15/23 1107  BP: (!) 99/53 120/74  105/70  Pulse: 90 98 81 73  Resp: 19 19 18 20   Temp: (!) 97.5 F (36.4 C) (!) 97.4 F (36.3 C) (!) 97.4 F (36.3 C)   TempSrc: Oral Oral Oral   SpO2: 94% 97% 99% 100%  Weight: 109.2 kg     Height:        Intake/Output Summary (Last 24 hours) at 12/15/2023 1157 Last data filed at 12/15/2023 1032  Gross per 24 hour  Intake 834 ml  Output 1125 ml  Net -291 ml   Filed Weights   12/13/23 0835 12/14/23 0537 12/15/23 0335  Weight: 99.8 kg 109.1 kg 109.2 kg    Examination:  General exam:  Feels fair, no events overnight HEENT: Positive JVD CVS: S1-S2, irregular rhythm, systolic murmur Lung clear, decreased breath sounds at the bases Abdomen: Soft, nontender, bowel sounds present Extremities: 1+ edema Skin: No rashes Psychiatry:  Mood & affect appropriate.     Data Reviewed:   CBC: Recent Labs  Lab 12/13/23 0910 12/14/23 1012 12/14/23 1343 12/15/23 0330  WBC 6.0 5.4 5.8 5.5  NEUTROABS 5.2  --   --   --   HGB 7.0* 7.5* 8.0* 7.7*  HCT 23.1* 24.7* 26.2* 24.6*  MCV 106.9* 99.2 98.5 98.8  PLT 220 197 201 190   Basic Metabolic Panel: Recent Labs  Lab 12/13/23 0910 12/14/23 0231 12/15/23 0330  NA 139 139 139  K 3.9 3.8 3.7  CL 107 107 106  CO2 24 27 27   GLUCOSE 62* 139* 107*  BUN 29* 30* 29*  CREATININE 1.00 1.03 0.92  CALCIUM 7.9* 7.8* 8.0*   GFR: Estimated Creatinine Clearance: 78.8 mL/min (by C-G formula based on SCr of 0.92 mg/dL). Liver Function Tests: Recent Labs  Lab 12/13/23 0910  AST 31  ALT 21  ALKPHOS 56  BILITOT 1.0  PROT 5.3*  ALBUMIN 2.7*   No results for input(s): "LIPASE", "AMYLASE" in the last 168 hours. No results for input(s): "AMMONIA" in the last 168 hours. Coagulation Profile: No results for input(s): "INR", "PROTIME" in the last 168 hours. Cardiac Enzymes: No results for input(s): "CKTOTAL", "CKMB", "CKMBINDEX", "TROPONINI" in the last 168 hours. BNP (last 3 results) No results for input(s): "PROBNP" in the last 8760 hours. HbA1C: No results for input(s): "HGBA1C" in the last 72 hours. CBG: Recent Labs  Lab 12/14/23 1033 12/14/23 1620 12/14/23 2037 12/15/23 0626 12/15/23 1107  GLUCAP 168* 159* 149* 99 144*   Lipid Profile: No results for input(s): "CHOL", "HDL", "LDLCALC", "TRIG", "CHOLHDL", "LDLDIRECT" in the last 72 hours. Thyroid Function Tests: No results for input(s): "TSH", "T4TOTAL", "FREET4", "T3FREE", "THYROIDAB" in the last 72 hours. Anemia Panel: Recent Labs    12/14/23 1341 12/14/23 1343   VITAMINB12  --  1,460*  FOLATE 20.8  --   FERRITIN  --  87  TIBC  --  330  IRON  --  47  RETICCTPCT  --  6.2*   Urine analysis:    Component Value Date/Time   COLORURINE RED (A) 12/06/2023 0428   APPEARANCEUR CLOUDY (A) 12/06/2023 0428   LABSPEC  12/06/2023 0428    TEST NOT REPORTED DUE TO COLOR INTERFERENCE OF URINE PIGMENT   PHURINE  12/06/2023 0428    TEST NOT REPORTED DUE TO COLOR INTERFERENCE OF URINE PIGMENT   GLUCOSEU (A) 12/06/2023 0428    TEST NOT REPORTED DUE TO COLOR INTERFERENCE OF URINE PIGMENT   HGBUR (A) 12/06/2023 0428    TEST NOT REPORTED DUE TO COLOR INTERFERENCE OF URINE PIGMENT   BILIRUBINUR (A) 12/06/2023 0428    TEST NOT REPORTED DUE TO COLOR INTERFERENCE OF URINE PIGMENT   KETONESUR (A) 12/06/2023 0428    TEST NOT REPORTED DUE TO COLOR INTERFERENCE OF URINE PIGMENT   PROTEINUR (A) 12/06/2023 0428    TEST NOT REPORTED DUE TO COLOR INTERFERENCE OF URINE PIGMENT   NITRITE (A) 12/06/2023 0428    TEST NOT REPORTED DUE TO COLOR  INTERFERENCE OF URINE PIGMENT   LEUKOCYTESUR (A) 12/06/2023 0428    TEST NOT REPORTED DUE TO COLOR INTERFERENCE OF URINE PIGMENT   Sepsis Labs: @LABRCNTIP (procalcitonin:4,lacticidven:4)  ) Recent Results (from the past 240 hours)  Urine Culture     Status: None   Collection Time: 12/06/23  4:28 AM   Specimen: Urine, Random  Result Value Ref Range Status   Specimen Description   Final    URINE, RANDOM Performed at [facility], [address], [place], [place] [address]    Special Requests   Final    NONE Reflexed from [id number] Performed at [facility], [address] [place], [place] [address]    Culture   Final    NO GROWTH Performed at [facility], [address], [place], [place] [address]    Report Status 12/08/2023 FINAL  Final     Radiology Studies: No results found.    Scheduled Meds:  aspirin EC  81 mg Oral Daily   atorvastatin  80 mg Oral Daily   clopidogrel  75 mg Oral Daily    dextrose  1 ampule Intravenous Once   [START ON 12/16/2023] furosemide  40 mg Intravenous Daily   insulin aspart  0-15 Units Subcutaneous TID WC   insulin glargine-yfgn  10 Units Subcutaneous Daily   midodrine  5 mg Oral BID   pantoprazole  40 mg Oral BID   sodium chloride flush  3 mL Intravenous Q12H   tamsulosin  0.4 mg Oral QHS   Continuous Infusions:  iron sucrose 400 mg (12/15/23 1117)     LOS: 2 days    Time spent:    Zannie Cove, MD [facility]   12/15/2023, 11:57 AM

## 2023-12-15 NOTE — Progress Notes (Addendum)
 Physical Therapy Treatment Patient Details Name: Mathew Baker MRN: [id number] DOB: 06/26/1941 Today's Date: 12/15/2023   History of Present Illness Mathew Baker is a 83 y.o. male admitted 12/13/23 for SOB and syncope. 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 Of note, recent hospitalizations 3/18 for multivessel disease staged PCI and 3/31-4/3 after being found to have hematuria with acute blood loss on Eliquis.   PT Comments  Pt greeted supine in bed, daughter and granddaughter present in room, pleasant and agreeable to PT session. Pt transferred using RW with CGA. He ambulated around the bed to recliner chair with +2 assist for safety. His primarily limiting factor is symptomatic orthostatic hypotension. RN and MD notified of BP readings and [facility] have been ordered, but were not present during session. Patient will benefit from continued inpatient follow up therapy, <3 hours/day to address deconditioning, decrease fall risk, and maximize independence with functional mobility.   Blood Pressure Seated EOB: 92/57 (65) Seated on BSC following transfer: 73/44 (54) Seated on recliner chair following short gait bout: 77/45 (56) Seated with feet elevated in recliner chair: 105/70 (81)    If plan is discharge home, recommend the following: A little help with bathing/dressing/bathroom;Assist for transportation;Assistance with cooking/housework;A lot of help with walking and/or transfers   Can travel by private vehicle     Yes  Equipment Recommendations       Recommendations for Other Services       Precautions / Restrictions Precautions Precautions: Fall Recall of Precautions/Restrictions: Intact Precaution/Restrictions Comments: watch  BP Restrictions Weight Bearing Restrictions Per Provider Order: No     Mobility  Bed Mobility Overal bed mobility: Needs Assistance Bed Mobility: Supine to Sit     Supine to sit: HOB elevated, Contact guard     General bed mobility comments: Pt sat up on L side of bed, bringing BLE off EOB, and pulling on PT to bring trunk upright and scoot fwd til feet supported.    Transfers Overall transfer level: Needs assistance Equipment used: Rolling walker (2 wheels) Transfers: Sit to/from Stand, Bed to chair/wheelchair/BSC Sit to Stand: Contact guard assist   Step pivot transfers: Contact guard assist       General transfer comment: VC to reiterated proper hand positioning and sequencing using RW. Pt stood from lowest bed height and transferred to [facility] on his left. Good eccentric control with sitting. Pt stood from [facility] by pushing up from armrests and transitioning to RW.    Ambulation/Gait Ambulation/Gait assistance: Contact guard assist, +2 safety/equipment (Chair Follow) Gait Distance (Feet): 10 Feet Assistive device: Rolling walker (2 wheels) Gait Pattern/deviations: Step-to pattern, Decreased step length - left, Decreased step length - right, Trunk flexed Gait velocity: reduced     General Gait Details: Pt ambulated from [facility] around FOB to recliner chair with CGA for safety. He navigated the RW well in a tight space. Pt took short slow steps with adequate foot clearence. He maintained a fwd flex posture. No overt LOB.   Stairs             Wheelchair Mobility     Tilt Bed    Modified Rankin (Stroke Patients Only)       Balance Overall balance assessment: Needs assistance Sitting-balance support: Feet supported, Single extremity supported Sitting balance-Leahy Scale: Fair Sitting balance -  Comments: Pt sat EOB with supervision. He addressed pericare while seated on BSC with other arm braced on arm rest.   Standing balance support: Bilateral upper extremity  supported, During functional activity, Reliant on assistive device for balance Standing balance-Leahy Scale: Poor Standing balance comment: Pt dependent on RW for stability.                            Communication Communication Communication: No apparent difficulties  Cognition Arousal: Alert Behavior During Therapy: WFL for tasks assessed/performed   PT - Cognitive impairments: No apparent impairments                         Following commands: Intact      Cueing Cueing Techniques: Verbal cues  Exercises      General Comments General comments (skin integrity, edema, etc.): Pt's BLE edema is increasing RLE>LLE. He has +2 pitting edema in RLE. BP: seated EOB 92/57 (65), seated on BSC following transfer 73/44 (54), seated on recliner chair following short gait bout 77/45 (56), end of session seated in recliner chair with feet raised 105/70 (81). Pt c/o intermittent dizziness. Pt's daughter and granddaughter present throughout session. They provided further insight into the deconditioning the pt has experienced and reported that there is limited family support available. Daughter states that the pt's wife is unable to physical assist the pt with any mobility and that her husband has had to come pick up the pt when he has fallen at home.      Pertinent Vitals/Pain Pain Assessment Pain Assessment: No/denies pain    Home Living                          Prior Function            PT Goals (current goals can now be found in the care plan section) Acute Rehab PT Goals Patient Stated Goal: Get stronger and move easier Progress towards PT goals: Progressing toward goals    Frequency    Min 2X/week      PT Plan      Co-evaluation              AM-PAC PT "6 Clicks" Mobility   Outcome Measure  Help needed turning from your back to your side while in a flat bed without using bedrails?: A Little Help needed moving from lying on your back to  sitting on the side of a flat bed without using bedrails?: A Little Help needed moving to and from a bed to a chair (including a wheelchair)?: A Little Help needed standing up from a chair using your arms (e.g., wheelchair or bedside chair)?: A Little Help needed to walk in hospital room?: Total Help needed climbing 3-5 steps with a railing? : Total 6 Click Score: 14    End of Session Equipment Utilized During Treatment: Gait belt Activity Tolerance: Treatment limited secondary to medical complications (Comment) (hypotension; RN and MD notified) Patient left: in chair;with call bell/phone within reach;with family/visitor present Nurse Communication: Mobility status;Other (comment) (BP response to activity) PT Visit Diagnosis: Unsteadiness on feet (R26.81);Difficulty in walking, not elsewhere classified (R26.2);Other abnormalities of gait and mobility (R26.89)     Time: 1610-9604 PT Time Calculation (min) (ACUTE ONLY): 24 min  Charges:    $Gait Training: 8-22 mins $Therapeutic Activity: 8-22 mins PT General Charges $$ ACUTE PT VISIT: 1 Visit  Cheri Guppy, PT, DPT Acute Rehabilitation Services Office: [phone] Secure Chat Preferred  Richardson Chiquito 12/15/2023, 12:55 PM

## 2023-12-15 NOTE — Plan of Care (Signed)
   Problem: Metabolic: Goal: Ability to maintain appropriate glucose levels will improve Outcome: Progressing   Problem: Skin Integrity: Goal: Risk for impaired skin integrity will decrease Outcome: Progressing   Problem: Education: Goal: Knowledge of General Education information will improve Description: Including pain rating scale, medication(s)/side effects and non-pharmacologic comfort measures Outcome: Progressing

## 2023-12-15 NOTE — TOC Progression Note (Signed)
 Transition of Care [facility]) - Progression Note    Patient Details  Name: Mathew Baker MRN: [id number] Date of Birth: 1940-11-07  Transition of Care [facility]) CM/SW Contact  Michaela Corner, [place] Phone Number: 12/15/2023, 11:55 AM  Clinical Narrative:   PT notified RNCM and CSW of updated recs to SNF. CSW met patient and daughter at bedside to discuss preference of SNF. Per daughter, she would like her father placed in [place].   TOC will continue to follow.    Expected Discharge Plan: Skilled Nursing Facility Barriers to Discharge: Continued Medical Work up, SNF Pending bed offer  Expected Discharge Plan and Services In-house Referral: Clinical Social Work     Living arrangements for the past 2 months: Single Family Home                             HH Agency: [facility] Date HH Agency Contacted: 12/08/23   Representative spoke with at [facility] Agency: Shaun   Social Determinants of Health (SDOH) Interventions SDOH Screenings   Food Insecurity: No Food Insecurity (12/13/2023)  Housing: Low Risk  (12/13/2023)  Transportation Needs: No Transportation Needs (12/13/2023)  Utilities: Not At Risk (12/13/2023)  Financial Resource Strain: Low Risk  (11/26/2023)   Received from [facility]  Social Connections: Moderately Isolated (12/13/2023)  Tobacco Use: Medium Risk (12/13/2023)    Readmission Risk Interventions     No data to display

## 2023-12-15 NOTE — NC FL2 (Signed)
 [place] MEDICAID FL2 LEVEL OF CARE FORM     IDENTIFICATION  Patient Name: Mathew Baker Birthdate: 05/07/1941 Sex: male Admission Date (Current Location): 12/13/2023  [facility] and [place] Number:  Producer, television/film/video and Address:  The . [facility], [address] [place], [place] [address]      Provider Number: [id number]  Attending Physician Name and Address:  Zannie Cove, MD  Relative Name and Phone Number:       Current Level of Care: Hospital Recommended Level of Care: Skilled Nursing Facility Prior Approval Number:    Date Approved/Denied:   PASRR Number: [id number]  Discharge Plan: SNF    Current Diagnoses: Patient Active Problem List   Diagnosis Date Noted   S/P coronary artery stent placement 12/14/2023   HFrEF (heart failure with reduced ejection fraction) (HCC) 12/13/2023   Nonrheumatic aortic valve stenosis 12/13/2023   CAD S/P percutaneous coronary angioplasty 12/13/2023   Symptomatic anemia 12/13/2023   GI bleed 12/13/2023   BPH (benign prostatic hyperplasia) 12/13/2023   Tubular adenoma of colon 12/13/2023   GERD (gastroesophageal reflux disease) 12/13/2023   Ischemic cardiomyopathy 12/13/2023   Demand ischemia (HCC) 12/13/2023   Elevated troponin level not due myocardial infarction 12/13/2023   Atrial fibrillation with controlled ventricular rate (HCC) 12/13/2023   Near syncope 12/13/2023   Hematuria 12/06/2023   NSTEMI (non-ST elevated myocardial infarction) (HCC) 11/21/2023   Acute on chronic systolic (congestive) heart failure (HCC) 11/21/2023   COPD (chronic obstructive pulmonary disease) (HCC) 11/21/2023   Paroxysmal atrial fibrillation (HCC) 11/21/2023   Hepatocellular carcinoma (HCC) 06/15/2023   Thrush 05/22/2023   History of stroke 05/22/2023   Acute respiratory failure with hypoxia (HCC) 05/21/2023   Lobar pneumonia (HCC) 05/21/2023   AKI (acute kidney injury) (HCC) 05/21/2023   Obesity (BMI 30-39.9)  05/21/2023   Hypokalemia 05/21/2023   Acute cholecystitis 05/19/2023   Iron deficiency anemia 03/24/2021   Unspecified open-angle glaucoma, indeterminate stage 03/24/2021   COVID-19 virus infection 09/09/2020   Syncope 09/09/2020   Elevated troponin 09/09/2020   Atrial fibrillation, chronic (HCC) 09/09/2020   Orthostatic hypotension 09/09/2020   Bilateral carotid artery disease (HCC) 06/07/2019   Uses walker 06/07/2019   Bradycardia 03/19/2019   Chest pain 12/20/2016   HTN (hypertension) 12/20/2016   HLD (hyperlipidemia) 12/20/2016   Syncope and collapse 12/20/2016   Uncontrolled type 2 diabetes mellitus with microalbuminuria 08/21/2014   Uncontrolled type 2 diabetes mellitus with hyperglycemia, with long-term current use of insulin (HCC) 08/21/2014    Orientation RESPIRATION BLADDER Height & Weight     Time, Self, Situation, Place  Normal Continent Weight: 240 lb 11.9 oz (109.2 kg) Height:  6\' 1"  (185.4 cm)  BEHAVIORAL SYMPTOMS/MOOD NEUROLOGICAL BOWEL NUTRITION STATUS      Continent Diet (see dc summary)  AMBULATORY STATUS COMMUNICATION OF NEEDS Skin   Extensive Assist Verbally Normal                       Personal Care Assistance Level of Assistance  Bathing, Feeding, Dressing Bathing Assistance:  (see dc summary) Feeding assistance:  (see dc summary) Dressing Assistance:  (see dc summary)     Functional Limitations Info  Sight, Hearing, Speech Sight Info: Adequate Hearing Info: Adequate Speech Info: Adequate    SPECIAL CARE FACTORS FREQUENCY  OT (By licensed OT), PT (By licensed PT)     PT Frequency: 5x week OT Frequency: 5x week  Contractures Contractures Info: Not present    Additional Factors Info  Code Status, Insulin Sliding Scale Code Status Info: DNR Limited     Insulin Sliding Scale Info: see dc summary       Current Medications (12/15/2023):  This is the current hospital active medication list Current Facility-Administered  Medications  Medication Dose Route Frequency Provider Last Rate Last Admin   acetaminophen (TYLENOL) tablet 650 mg  650 mg Oral Q6H PRN Clydie Braun, MD       Or   acetaminophen (TYLENOL) suppository 650 mg  650 mg Rectal Q6H PRN Madelyn Flavors A, MD       albuterol (PROVENTIL) (2.5 MG/3ML) 0.083% nebulizer solution 2.5 mg  2.5 mg Nebulization Q6H PRN Clydie Braun, MD       aspirin EC tablet 81 mg  81 mg Oral Daily Parcells, Taylor A, PA-C   81 mg at 12/15/23 0737   atorvastatin (LIPITOR) tablet 80 mg  80 mg Oral Daily Madelyn Flavors A, MD   80 mg at 12/15/23 0738   clopidogrel (PLAVIX) tablet 75 mg  75 mg Oral Daily Parcells, Taylor A, PA-C   75 mg at 12/15/23 0737   dextrose 50 % solution 50 mL  1 ampule Intravenous Once Smith, Rondell A, MD       dextrose 50 % solution 50 mL  1 ampule Intravenous PRN Clydie Braun, MD       [START ON 12/16/2023] furosemide (LASIX) injection 40 mg  40 mg Intravenous Daily Tolia, Sunit, DO       insulin aspart (novoLOG) injection 0-15 Units  0-15 Units Subcutaneous TID WC Smith, Rondell A, MD   2 Units at 12/15/23 1120   insulin glargine-yfgn (SEMGLEE) injection 10 Units  10 Units Subcutaneous Daily Zannie Cove, MD   10 Units at 12/15/23 0738   iron sucrose (VENOFER) 400 mg in sodium chloride 0.9 % 250 mL IVPB  400 mg Intravenous Once Zannie Cove, MD 108 mL/hr at 12/15/23 1117 400 mg at 12/15/23 1117   midodrine (PROAMATINE) tablet 10 mg  10 mg Oral BID Zannie Cove, MD       pantoprazole (PROTONIX) EC tablet 40 mg  40 mg Oral BID Madelyn Flavors A, MD   40 mg at 12/15/23 0737   sodium chloride flush (NS) 0.9 % injection 3 mL  3 mL Intravenous Q12H Smith, Rondell A, MD   3 mL at 12/15/23 1117   tamsulosin (FLOMAX) capsule 0.4 mg  0.4 mg Oral QHS Madelyn Flavors A, MD   0.4 mg at 12/14/23 2058     Discharge Medications: Please see discharge summary for a list of discharge medications.  Relevant Imaging Results:  Relevant Lab  Results:   Additional Information ZO#[id number]  Michaela Corner, LCSWA

## 2023-12-15 NOTE — Progress Notes (Signed)
 Rounding Note    Patient Name: Mathew Baker Date of Encounter: 12/15/2023  [facility] Cardiologist: None  Chief Complaint:  Chief Complaint  Patient presents with   Shortness of Breath   Reason of consult: history of NSTEMI/recent coronary intervention and ? syncope.   Subjective   Shortness of breath improving. Diuresing well.  Unfortunately, strict I's and O's are not accurate.  Spoke to nursing staff again. Denies anginal chest pain. Daughter at bedside  Inpatient Medications    Scheduled Meds:  aspirin EC  81 mg Oral Daily   atorvastatin  80 mg Oral Daily   clopidogrel  75 mg Oral Daily   dextrose  1 ampule Intravenous Once   [START ON 12/16/2023] furosemide  40 mg Intravenous Daily   insulin aspart  0-15 Units Subcutaneous TID WC   insulin glargine-yfgn  10 Units Subcutaneous Daily   midodrine  5 mg Oral BID   pantoprazole  40 mg Oral BID   sodium chloride flush  3 mL Intravenous Q12H   tamsulosin  0.4 mg Oral QHS   Continuous Infusions:  iron sucrose     PRN Meds: acetaminophen **OR** acetaminophen, albuterol, dextrose   Vital Signs    Vitals:   12/15/23 0009 12/15/23 0335 12/15/23 0721 12/15/23 0900  BP: (!) 110/55 (!) 99/53 120/74   Pulse: 84 90 98 81  Resp: 18 19 19 18   Temp: 97.8 F (36.6 C) (!) 97.5 F (36.4 C) (!) 97.4 F (36.3 C) (!) 97.4 F (36.3 C)  TempSrc: Oral Oral Oral Oral  SpO2: 93% 94% 97% 99%  Weight:  109.2 kg    Height:        Intake/Output Summary (Last 24 hours) at 12/15/2023 1107 Last data filed at 12/15/2023 1032 Gross per 24 hour  Intake 834 ml  Output 1125 ml  Net -291 ml      12/15/2023    3:35 AM 12/14/2023    5:37 AM 12/13/2023    8:35 AM  Last 3 Weights  Weight (lbs) 240 lb 11.9 oz 240 lb 8.4 oz 220 lb  Weight (kg) 109.2 kg 109.1 kg 99.791 kg      Telemetry    Atrial fibrillation with controlled ventricular rate- Personally Reviewed  ECG    No new tracing- Personally  Reviewed  Physical Exam   Physical Exam  Constitutional: No distress. He appears chronically ill.  hemodynamically stable  Neck: No JVD present.  Cardiovascular: Normal rate, S1 normal and S2 normal. An irregularly irregular rhythm present. Exam reveals no gallop, no S3 and no S4.  Murmur heard. Systolic murmur is present with a grade of 3/6 at the upper right sternal border. Holosystolic  murmur of grade 3/6 is also present at the apex. Pulses:      Carotid pulses are  on the right side with bruit and  on the left side with bruit. Pulmonary/Chest: Effort normal and breath sounds normal. No stridor. He has no wheezes. He has no rales.  Musculoskeletal:        General: Edema present.     Cervical back: Neck supple.  Skin: Skin is warm.   Labs    High Sensitivity Troponin:   Recent Labs  Lab 11/19/23 0836 12/05/23 1951 12/06/23 0313 12/13/23 0910 12/13/23 1050  TROPONINIHS >24,000* 116* 109* 520* 497*     Chemistry Recent Labs  Lab 12/13/23 0910 12/14/23 0231 12/15/23 0330  NA 139 139 139  K 3.9 3.8 3.7  CL 107  107 106  CO2 24 27 27   GLUCOSE 62* 139* 107*  BUN 29* 30* 29*  CREATININE 1.00 1.03 0.92  CALCIUM 7.9* 7.8* 8.0*  PROT 5.3*  --   --   ALBUMIN 2.7*  --   --   AST 31  --   --   ALT 21  --   --   ALKPHOS 56  --   --   BILITOT 1.0  --   --   GFRNONAA >60 >60 >60  ANIONGAP 8 5 6     Lipids No results for input(s): "CHOL", "TRIG", "HDL", "LABVLDL", "LDLCALC", "CHOLHDL" in the last 168 hours.  Hematology Recent Labs  Lab 12/14/23 1012 12/14/23 1343 12/15/23 0330  WBC 5.4 5.8 5.5  RBC 2.49* 2.66*  2.61* 2.49*  HGB 7.5* 8.0* 7.7*  HCT 24.7* 26.2* 24.6*  MCV 99.2 98.5 98.8  MCH 30.1 30.1 30.9  MCHC 30.4 30.5 31.3  RDW 28.4* 28.2* 27.7*  PLT 197 201 190   Thyroid No results for input(s): "TSH", "FREET4" in the last 168 hours.  BNP Recent Labs  Lab 12/13/23 0910  BNP 540.4*    DDimer No results for input(s): "DDIMER" in the last 168 hours.    Radiology    NA  Cardiac Studies   LHC 11/21/2023   Prox RCA lesion is 75% stenosed.   Mid RCA-1 lesion is 75% stenosed.   Mid RCA-2 lesion is 75% stenosed.   Mid RCA to Dist RCA lesion is 90% stenosed.   Dist RCA lesion is 50% stenosed with 50% stenosed side branch in RPAV.   Ost RCA to Prox RCA lesion is 50% stenosed.   Ost LM to Mid LM lesion is 70% stenosed.   Dist LM lesion is 90% stenosed.   Ost Cx lesion is 100% stenosed.   Ost LAD to Prox LAD lesion is 95% stenosed.   Prox LAD to Dist LAD lesion is 50% stenosed.   2nd Diag lesion is 75% stenosed.   There is moderate to severe left ventricular systolic dysfunction.   LV end diastolic pressure is moderately elevated.   The left ventricular ejection fraction is 25-35% by visual estimate.   Anticipated discharge date to be determined.   Recommend to resume Apixaban, at currently prescribed dose and frequency.   Conclusion Left heart cath right radial approach Non-STEMI shortness of breath   Left ventriculogram Left ventricular enlargement global hypokinesis EF around 30%   Coronaries Left main large moderate to severe calcification 70% distal LAD large 90% ostial 95% proximal diffuse 50% moderate to severe calcification proximally Circumflex 100% occluded ostially moderate to severe calcification RCA large serial 75% lesions mid heavy calcification 90% mid to distal moderate calcification right dominant system moderate disease diffusely distally   Intervention deferred Recommend evaluation for high risk coronary bypass surgery versus high risk PCI and stent for complex disease Medical therapy is also not unreasonable Will discuss images with tertiary care facility   Echocardiogram 11/19/2023 IMPRESSIONS  1. Left ventricular ejection fraction, by estimation, is 25 to 30%. The  left ventricle has severely decreased function. The left ventricle  demonstrates global hypokinesis. There is mild concentric left  ventricular  hypertrophy. Left ventricular diastolic   parameters are consistent with Grade I diastolic dysfunction (impaired  relaxation). Elevated left ventricular end-diastolic pressure.   2. Right ventricular systolic function is normal. The right ventricular  size is normal. There is normal pulmonary artery systolic pressure.   3. Left atrial size was moderately dilated.  4. The mitral valve is normal in structure. Mild mitral valve  regurgitation. No evidence of mitral stenosis.   5. Dimensionless index in 0.23, consistent with severe aortic stenosis.  The severity of the aortic stenosis is likely underestimated by mean  gradient due to low flow caused by systolic dysfunction.. The aortic valve  is calcified. There is severe  calcifcation of the aortic valve. There is severe thickening of the aortic  valve. Aortic valve regurgitation is mild to moderate. Severe aortic valve  stenosis. Aortic valve mean gradient measures 21.0 mmHg. Aortic valve Vmax  measures 2.95 m/s.   6. The inferior vena cava is dilated in size with <50% respiratory  variability, suggesting right atrial pressure of 15 mmHg.    PCI 3/19 -- [facility] Planned PCI of the LAD and LM. Patient known to have severe AS and had repeat [facility] echo to evaluate. Placed a 50cc IABP at the beginning of the case via ultrasound guidance for support. Obtained right radial artery access. Primary wired the LM into LAD  with a rota floppy wire. Performed 4 runs of rotational atherectomy with a 1.5 burr. Post-dilated with a 2.5 then a 3.0 x 20 [place]. IVUS revealed severe concentric calcification with fractured calcium and distal diameter of 3.0, Stent with a 3.0 x 38  Synergy overlapped proximally with a  4.0 x 16 Megatron, and post-dilated with a 3.0 and 4.0 Barrett. IABP sutured in placed.   Recommendations:  Successful PCI of the LM into LCx. Plan for RCA PCI on Friday with Dr. Yetta Barre. Recommend leaving IABP in place if tolerated until PCI Friday.  DAPT for a year if tolerated. Begin evaluation for TAVR.    PCI 3/24 -- [facility] Severely calcified RCA. IABP removed over the weekend, so replaced it for the PCI today via left common femoral artery. Primary wired the RCA with the viper wire. Performed 5 runs of orbital atherectomy to the distal and proximal RCA. Post-dilated with a  2.5 and 3.0 . IVUS demonstrated distal reference diameter of 3.2-3.4 so placed a 3.0 x 38 Synergy stent overlapped with a 3.5 x38 Synergy stent, and post-dilated with a 3.5  and 4.0 . IABP removed at the end of the case and perclosed.  Recommendations: DAPT for at least a year. Evaluation of aortic stenosis for need for TAVR. Further management per primary team.  Patient Profile     83 y.o. male  with a hx of CAD s/p NSTEMI 11/2023 s/p multivessel PCI, HTN, HLD, severe low-flow, low-gradient AS, chronic HFrEF, diabetes, COPD, history of CVA, atrial fibrillation, orthostatic hypotension, hepatocellular carcinoma (treated  in October 2023), BPH, urinary retention.  Assessment & Plan    Impression:  Acute on chronic heart failure with reduced EF  Ischemic cardiomyopathy Recent NSTEMI status post multivessel PCI Near-syncope/falls Melanotic stools, gross hematuria secondary to traumatic Foley/positive fecal occult blood/status post PRBCs/suspected GI bleed Elevated troponins, demand ischemia in the setting of cardiomyopathy, recent NSTEMI, and suspected GI bleed Aortic stenosis, low-flow low gradient Atrial fibrillation, controlled ventricular rate History of orthostatic hypotension, on midodrine Uncontrolled diabetes mellitus type 2-presents with hypoglycemia History of tubular adenoma   Recommendations:  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 EKG  nonischemic. Troponins above normal limits but likely secondary to demand ischemia. Patient underwent complex PCI to the left main/LCx and most  recent to the RCA on November 28, 2023. Recommend at least dual antiplatelet therapy for 1 month from his last stent on November 28, 2023 to decrease chance of stent thrombosis. Until April 24,2025 and thereafter monotherapy alone as long as HB is stable.  Spoke to interventional colleagues (12/14/2023) as well regarding his case who agree that given the recent ACS and multivessel PCI w/ unprotected left main 1 month of DAPT is reasonable if Hb remains stable. And if he continues to drop his Hb despite holding OAC than even antiplatelets will need to be reconsidered.  Uptitration of GDMT has been difficult due to hypotension, as per [facility] records in Care Everywhere. Increase Lasix 20mg  IVP to 40mg  IVP Compression stockings and ambulation recommended.    Near-syncope/falls Multifactorial: Baseline orthostatic hypotension, hypoglycemia on arrival blood glucose 43, suspected GI bleed/anemia, deconditioning, history of falls, and underlying valvular heart disease in the setting of cardiomyopathy, etc. Continue midodrine, at home dose Check orthostatic vital signs -  negative.  Diabetes/hyperglycemia management per primary team Suspected GI bleed/anemia: GI following, last note 12/13/2023 Gross hematuria secondary to traumatic Foley status post fall Aortic stenosis-being considered for TAVR at [facility] health as outpatient   Symptomatic anemia: melanotic stools, gross hematuria secondary to traumatic Foley/positive fecal occult blood/status post PRBCs/suspected GI bleed GI recommendations reviewed - they feel he is high risk for endoscopy and recommending conservative management at this time.  Status post 2 units of PRBCs while at [facility] Status post 1 unit of PRBC in the ER during this hospitalization    Aortic stenosis, low-flow low gradient: Being considered for  TAVR workup as outpatient at [facility] health   Atrial fibrillation, controlled ventricular rate: Currently not on AV nodal blocking agents due to orthostasis and hypotension in the past, per EMR Not on antiarrhythmic medications.  Was placed on Eliquis for thromboembolic prophylaxis.  However has required a total of 3 units of PRBCs in the recent past and the suspected source(s) either GI or GU due to traumatic foley.  His  high CHA2DS2-VASc SCORE does place him at higher risk for thromboembolic event.  However in the setting of suspected GI/GU bleed and anemia & blood transfusion I had a long discussion with patient and his daughter Mathew Baker regarding antiplatelets and anticoagulation management.     After discussing the risks, benefits, alternatives to oral anticoagulation and antiplatelets both patient and Mathew Baker participated in a shared decision-making process to continue antiplatelets given his recent NSTEMI and undergoing multivessel PCI to left main/Lcx and RCA and to monitor H&H.   Shared decision is to stop Eliquis for now. Re-trail with OAC could be considered after bleeding sources are evaluated/corrected and hemoglobin stabilizes or consider outpatient workup for left atrial appendage occlusion device.    They understand that not being on anticoagulation predisposes him to possible thromboembolic events (i.e. stroke)and therefore needs to be more cognizant and to seek medical attention if such symptoms arise.   He will need close follow up w/ cardiology, GI, and PCP post discharge.    History of orthostatic hypotension, on midodrine   Uncontrolled diabetes mellitus type 2-presents with hypoglycemia A1c 9.1, as of 11/2023 Was hypoglycemic on arrival. Diabetes management per primary   History of tubular adenoma: Management per primary and GI service  Primary team to discuss goals of care and code status with patient and family. Will follow the patient with you.    For questions or  updates, please contact [facility] Please consult [email or web address] for contact  info under     Updated RN, patient, and daughter during morning rounds.   Signed, Tessa Lerner, DO, [facility]  [facility]  [address] [place], [place] [address] Pager: [phone] Office: [phone] 12/15/2023, 11:07 AM

## 2023-12-16 DIAGNOSIS — K921 Melena: Secondary | ICD-10-CM | POA: Diagnosis not present

## 2023-12-16 DIAGNOSIS — I255 Ischemic cardiomyopathy: Secondary | ICD-10-CM | POA: Diagnosis not present

## 2023-12-16 DIAGNOSIS — I2510 Atherosclerotic heart disease of native coronary artery without angina pectoris: Secondary | ICD-10-CM | POA: Diagnosis not present

## 2023-12-16 DIAGNOSIS — Z7902 Long term (current) use of antithrombotics/antiplatelets: Secondary | ICD-10-CM

## 2023-12-16 DIAGNOSIS — I5023 Acute on chronic systolic (congestive) heart failure: Secondary | ICD-10-CM | POA: Diagnosis not present

## 2023-12-16 DIAGNOSIS — D500 Iron deficiency anemia secondary to blood loss (chronic): Secondary | ICD-10-CM | POA: Diagnosis not present

## 2023-12-16 DIAGNOSIS — D649 Anemia, unspecified: Secondary | ICD-10-CM | POA: Diagnosis not present

## 2023-12-16 LAB — GLUCOSE, CAPILLARY
Glucose-Capillary: 75 mg/dL (ref 70–99)
Glucose-Capillary: 151 mg/dL — ABNORMAL HIGH (ref 70–99)
Glucose-Capillary: 214 mg/dL — ABNORMAL HIGH (ref 70–99)
Glucose-Capillary: 201 mg/dL — ABNORMAL HIGH (ref 70–99)

## 2023-12-16 LAB — CBC
HCT: 24.8 % — ABNORMAL LOW (ref 39.0–52.0)
HCT: 25.4 % — ABNORMAL LOW (ref 39.0–52.0)
Hemoglobin: 7.6 g/dL — ABNORMAL LOW (ref 13.0–17.0)
Hemoglobin: 7.7 g/dL — ABNORMAL LOW (ref 13.0–17.0)
MCH: 30.3 pg (ref 26.0–34.0)
MCH: 30 pg (ref 26.0–34.0)
MCHC: 30.6 g/dL (ref 30.0–36.0)
MCHC: 30.3 g/dL (ref 30.0–36.0)
MCV: 98.8 fL (ref 80.0–100.0)
MCV: 98.8 fL (ref 80.0–100.0)
Platelets: 197 10*3/uL (ref 150–400)
Platelets: 209 10*3/uL (ref 150–400)
RBC: 2.51 MIL/uL — ABNORMAL LOW (ref 4.22–5.81)
RBC: 2.57 MIL/uL — ABNORMAL LOW (ref 4.22–5.81)
RDW: 26.9 % — ABNORMAL HIGH (ref 11.5–15.5)
RDW: 26.8 % — ABNORMAL HIGH (ref 11.5–15.5)
WBC: 6.5 10*3/uL (ref 4.0–10.5)
WBC: 5.5 10*3/uL (ref 4.0–10.5)
nRBC: 0 % (ref 0.0–0.2)
nRBC: 0 % (ref 0.0–0.2)

## 2023-12-16 LAB — BASIC METABOLIC PANEL WITH GFR
Anion gap: 8 (ref 5–15)
BUN: 25 mg/dL — ABNORMAL HIGH (ref 8–23)
CO2: 25 mmol/L (ref 22–32)
Calcium: 7.8 mg/dL — ABNORMAL LOW (ref 8.9–10.3)
Chloride: 106 mmol/L (ref 98–111)
Creatinine, Ser: 0.95 mg/dL (ref 0.61–1.24)
GFR, Estimated: >60 mL/min (ref >60)
Glucose, Bld: 90 mg/dL (ref 70–99)
Potassium: 4.2 mmol/L (ref 3.5–5.1)
Sodium: 139 mmol/L (ref 135–145)

## 2023-12-16 MED ORDER — INSULIN GLARGINE-YFGN 100 UNIT/ML ~~LOC~~ SOLN
6.0000 [IU] | Freq: Every day | SUBCUTANEOUS | Status: DC
  Administered 2023-12-17 – 2023-12-20 (×4): 6 [IU] via SUBCUTANEOUS
  Filled 2023-12-16 (×4): qty 0.06

## 2023-12-16 NOTE — Progress Notes (Signed)
 [facility] Progress Note  Chief Complaint: Melena, anemia of chronic blood loss  History:  Since we saw this patient in consult 3 days ago, he has had slow steady diuresis on the direction of the internal medicine and cardiology service.  With that he feels that his breathing is improved.  He still reportedly having some intermittent black stool, hemoglobin holding steady since 1 unit PRBC transfusion upon admission and while off any anticoagulation and antiplatelet agents.   ROS: Cardiovascular: No chest pain Respiratory: Chronic dyspnea, improved admission Urinary: No dysuria  Objective:   Current Facility-Administered Medications:    acetaminophen (TYLENOL) tablet 650 mg, 650 mg, Oral, Q6H PRN **OR** acetaminophen (TYLENOL) suppository 650 mg, 650 mg, Rectal, Q6H PRN, Katrinka Blazing, Rondell A, MD   albuterol (PROVENTIL) (2.5 MG/3ML) 0.083% nebulizer solution 2.5 mg, 2.5 mg, Nebulization, Q6H PRN, Katrinka Blazing, Rondell A, MD   aspirin EC tablet 81 mg, 81 mg, Oral, Daily, Parcells, Taylor A, PA-C, 81 mg at 12/16/23 0911   atorvastatin (LIPITOR) tablet 80 mg, 80 mg, Oral, Daily, Katrinka Blazing, Rondell A, MD, 80 mg at 12/16/23 0911   clopidogrel (PLAVIX) tablet 75 mg, 75 mg, Oral, Daily, Parcells, Taylor A, PA-C, 75 mg at 12/16/23 0911   dextrose 50 % solution 50 mL, 1 ampule, Intravenous, Once, Smith, Rondell A, MD   dextrose 50 % solution 50 mL, 1 ampule, Intravenous, PRN, Katrinka Blazing, Rondell A, MD   furosemide (LASIX) injection 40 mg, 40 mg, Intravenous, Daily, Tolia, Sunit, DO, 40 mg at 12/16/23 0911   insulin aspart (novoLOG) injection 0-15 Units, 0-15 Units, Subcutaneous, TID WC, Smith, Rondell A, MD, 5 Units at 12/16/23 1718   [START ON 12/17/2023] insulin glargine-yfgn (SEMGLEE) injection 6 Units, 6 Units, Subcutaneous, Daily, Zannie Cove, MD   midodrine (PROAMATINE) tablet 10 mg, 10 mg, Oral, BID, Zannie Cove, MD, 10 mg at 12/16/23 0911   pantoprazole (PROTONIX) EC tablet 40 mg, 40 mg, Oral, BID,  Smith, Rondell A, MD, 40 mg at 12/16/23 0911   sodium chloride flush (NS) 0.9 % injection 3 mL, 3 mL, Intravenous, Q12H, Smith, Rondell A, MD, 3 mL at 12/16/23 0916   tamsulosin (FLOMAX) capsule 0.4 mg, 0.4 mg, Oral, QHS, Smith, Rondell A, MD, 0.4 mg at 12/15/23 2046     Vital signs in last 24 hrs: Vitals:   12/16/23 1105 12/16/23 1615  BP: 98/63 99/72  Pulse: 71 89  Resp: 20 18  Temp:  98 F (36.7 C)  SpO2: 95% 99%    Intake/Output Summary (Last 24 hours) at 12/16/2023 1729 Last data filed at 12/16/2023 1447 Gross per 24 hour  Intake 1095 ml  Output 1440 ml  Net -345 ml     Physical Exam  HEENT: sclera anicteric, oral mucosa without lesions Neck: supple, no thyromegaly,  Cardiac: Irregular with harsh systolic murmur, [id number] heard, + peripheral edema (improved somewhat from admission) Pulm: clear to auscultation bilaterally, normal RR and effort noted Abdomen: soft, obese, no tenderness, with active bowel sounds. No guarding or palpable hepatosplenomegaly Skin; warm and dry, pale, no jaundice  Recent Labs:     Latest Ref Rng & Units 12/16/2023   12:32 PM 12/16/2023    3:15 AM 12/15/2023    3:30 AM  CBC  WBC 4.0 - 10.5 K/uL 5.5  6.5  5.5   Hemoglobin 13.0 - 17.0 g/dL 7.7  7.6  7.7   Hematocrit 39.0 - 52.0 % 25.4  24.8  24.6   Platelets 150 - 400 K/uL 209  197  190     No results for input(s): "INR" in the last 168 hours.    Latest Ref Rng & Units 12/16/2023    3:15 AM 12/15/2023    3:30 AM 12/14/2023    2:31 AM  CMP  Glucose 70 - 99 mg/dL 90  161  096   BUN 8 - 23 mg/dL 25  29  30    Creatinine 0.61 - 1.24 mg/dL 0.45  4.09  8.11   Sodium 135 - 145 mmol/L 139  139  139   Potassium 3.5 - 5.1 mmol/L 4.2  3.7  3.8   Chloride 98 - 111 mmol/L 106  106  107   CO2 22 - 32 mmol/L 25  27  27    Calcium 8.9 - 10.3 mg/dL 7.8  8.0  7.8      Radiologic studies:   Assessment & Plan  Assessment: Melena Chronic blood loss anemia  Bleeding exacerbated by recent use of  anticoagulants and antiplatelet agents and patient with multiple complex cardiopulmonary issues including recent MI with high risk left main coronary artery drug-eluting stent placement at [facility]  Admitted with volume overload, now improved after diuresis and blood pressure support. Cardiology's impression other today: "Optimized from a heart failure standpoint, stage C, NYHA class II No anginal chest pain. EKG nonischemic. Troponins above normal limits but likely secondary to demand ischemia. Patient underwent complex PCI to the left main/LCx and most recent to the RCA on November 28, 2023. Recommend at least dual antiplatelet therapy for 1 month from his last stent on November 28, 2023 to decrease chance of stent thrombosis. Until April 24,2025 and thereafter monotherapy alone w/ Plavix 75mg  po qday as long as HB is stable. Recommended they follow up w/ primary cardiology [facility] prior to 12/29/2023 to discuss antiplatelet/anticoagulation management going forward. Daughter and patient verbalized understanding.  Spoke to interventional colleagues (12/14/2023) as well regarding his case who agree that given the recent ACS and multivessel PCI w/ unprotected left main 1 month of DAPT is reasonable if Hb remains stable. And if he continues to drop his Hb despite holding OAC than even holding antiplatelets will need to be reconsidered.  Uptitration of GDMT has been difficult due to hypotension, as per [facility] records in Care Everywhere. Had to increase midodrine due to soft BP  Continue Lasix 40mg  IVP w/ parameters Compression stockings and ambulation recommended. "   Plan: I had a long discussion with Mathew Baker and his son-in-law who is at the bedside for the my entire visit. Very tough situation, ongoing GI bleeding of unclear source but suspected most likely to be upper.  Persists but probably slowed since stopping some antiplatelet and anticoagulant medicines.  Antiplatelet agents high indication given  recent coronary stent and risk of that developing acute stenosis. He has very high risk for sedation for endoscopic procedures as well as other complications of bleeding or perforation. He is very clear eyed about that based on our discussions a few days ago upon admission and again now.  I offered him an upper endoscopy as long as our anesthesia staff is willing to proceed, understanding that it may be limited by respiratory and cardiovascular issues while under sedation.  His DNR status would need to be reversed for the procedure and he is aware that. He hopes to have a path forward to continue the meds that he needs in hopes of any further cardiac interventions that may be helpful for him.  I discussed the case with Dr. Jomarie Longs  of the medical service and conveyed my concerns about this degree of anemia the patient needs to undergo sedation for EGD.  She was agreeable to a transfusion of 1 unit PRBCs with diuresis, all done slowly and cautiously given his tenuous volume status.  We will reevaluate him over the weekend to determine appropriate timing of this procedure by perhaps Monday at this point.   40 minutes were spent on this encounter (including chart review, history/exam, counseling/coordination of care, and documentation) > 50% of that time was spent on counseling and coordination of care.    Mathew Baker Office: [phone]

## 2023-12-16 NOTE — Plan of Care (Signed)
  Problem: Metabolic: Goal: Ability to maintain appropriate glucose levels will improve Outcome: Progressing   Problem: Education: Goal: Knowledge of General Education information will improve Description: Including pain rating scale, medication(s)/side effects and non-pharmacologic comfort measures Outcome: Progressing   Problem: Safety: Goal: Ability to remain free from injury will improve Outcome: Progressing

## 2023-12-16 NOTE — Progress Notes (Signed)
 Occupational Therapy Treatment Patient Details Name: Mathew Baker MRN: [id number] DOB: 12-26-1940 Today's Date: 12/16/2023   History of present illness Clemon Micholas Drumwright is a 83 y.o. male admitted 12/13/23 for SOB and syncope. 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 Of note, recent hospitalizations 3/18 for multivessel disease staged PCI and 3/31-4/3 after being found to have hematuria with acute blood loss on Eliquis.   OT comments  Patient with improved mobility over eval the prior date, no dizziness with transitional movements.  Overall CGA for bed to recliner, with small improvement with ADL completion, Min to Mod A from sit to stand level.  OT will continue efforts in the acute setting, and Patient will benefit from continued inpatient follow up therapy, <3 hours/day.        If plan is discharge home, recommend the following:  A little help with walking and/or transfers;A little help with bathing/dressing/bathroom;Assistance with cooking/housework;Assist for transportation   Equipment Recommendations  Tub/shower seat    Recommendations for Other Services      Precautions / Restrictions Precautions Precautions: Fall Recall of Precautions/Restrictions: Intact Precaution/Restrictions Comments: watch BP Restrictions Weight Bearing Restrictions Per Provider Order: No       Mobility Bed Mobility Overal bed mobility: Needs Assistance Bed Mobility: Supine to Sit     Supine to sit: Supervision          Transfers Overall transfer level: Needs assistance Equipment used: Rolling walker (2 wheels) Transfers: Sit to/from Stand, Bed to chair/wheelchair/BSC Sit to Stand: Contact guard assist     Step pivot transfers: Contact guard assist            Balance Overall balance assessment: Needs assistance Sitting-balance support: Feet supported, Single extremity supported Sitting balance-Leahy Scale: Good     Standing balance support: Reliant on assistive device for balance Standing balance-Leahy Scale: Poor                             ADL either performed or assessed with clinical judgement   ADL Overall ADL's : Needs assistance/impaired     Grooming: Wash/dry hands;Wash/dry face;Supervision/safety;Standing           Upper Body Dressing : Set up;Sitting   Lower Body Dressing: Moderate assistance;Sit to/from stand;Minimal assistance   Toilet Transfer: Contact guard assist;Rolling walker (2 wheels);BSC/3in1;Ambulation                  Extremity/Trunk Assessment Upper Extremity Assessment Upper Extremity Assessment: Overall WFL for tasks assessed   Lower Extremity Assessment Lower Extremity Assessment: Defer to PT evaluation   Cervical / Trunk Assessment Cervical / Trunk Assessment: Normal    Vision Patient Visual Report: No change from baseline     Perception Perception Perception: Not tested   Praxis Praxis Praxis: Not tested   Communication Communication Communication: No apparent difficulties Factors Affecting Communication: Hearing impaired   Cognition Arousal: Alert Behavior During Therapy: WFL for tasks assessed/performed Cognition: No apparent impairments                               Following commands: Intact        Cueing   Cueing Techniques: Verbal cues  Exercises      Shoulder  Instructions       General Comments      Pertinent Vitals/ Pain       Pain Assessment Pain Assessment: No/denies pain                                                          Frequency  Min 2X/week        Progress Toward Goals  OT Goals(current goals can now be found in the care plan section)  Progress towards OT goals:  Progressing toward goals  Acute Rehab OT Goals OT Goal Formulation: With patient Time For Goal Achievement: 12/29/23 Potential to Achieve Goals: Good  Plan      Co-evaluation                 AM-PAC OT "6 Clicks" Daily Activity     Outcome Measure   Help from another person eating meals?: None Help from another person taking care of personal grooming?: None Help from another person toileting, which includes using toliet, bedpan, or urinal?: A Little Help from another person bathing (including washing, rinsing, drying)?: A Little Help from another person to put on and taking off regular upper body clothing?: None Help from another person to put on and taking off regular lower body clothing?: A Little 6 Click Score: 21    End of Session Equipment Utilized During Treatment: Rolling walker (2 wheels)  OT Visit Diagnosis: History of falling (Z91.81)   Activity Tolerance Patient tolerated treatment well   Patient Left in chair;with call bell/phone within reach;with family/visitor present   Nurse Communication Mobility status        Time: 1020-1036 OT Time Calculation (min): 16 min  Charges: OT General Charges $OT Visit: 1 Visit OT Treatments $Self Care/Home Management : 8-22 mins  12/16/2023  RP, OTR/L  Acute Rehabilitation Services  Office:  [phone]   Suzanna Obey 12/16/2023, 11:02 AM

## 2023-12-16 NOTE — TOC Progression Note (Addendum)
 Transition of Care [facility]) - Progression Note    Patient Details  Name: Mathew Baker MRN: [id number] Date of Birth: 04-Dec-1940  Transition of Care [facility]) CM/SW Contact  Michaela Corner, [place] Phone Number: 12/16/2023, 10:40 AM  Clinical Narrative:  CSW met pt and dtr at bedside to discuss choice of SNF. Dtr chose [facility], pt is agreeable. CSW notified facility.   2:59 PM Verlon Au called CSW and informed that family would like patient to go to [facility] in [place]. CSW notified [facility] and [facility] and [address].   TOC will continue to follow.    Expected Discharge Plan: Skilled Nursing Facility Barriers to Discharge: Continued Medical Work up  Expected Discharge Plan and Services In-house Referral: Clinical Social Work     Living arrangements for the past 2 months: Single Family Home                             HH Agency: [facility] Date HH Agency Contacted: 12/08/23   Representative spoke with at [facility] Agency: Shaun   Social Determinants of Health (SDOH) Interventions SDOH Screenings   Food Insecurity: No Food Insecurity (12/13/2023)  Housing: Low Risk  (12/13/2023)  Transportation Needs: No Transportation Needs (12/13/2023)  Utilities: Not At Risk (12/13/2023)  Financial Resource Strain: Low Risk  (11/26/2023)   Received from [facility]  Social Connections: Moderately Isolated (12/13/2023)  Tobacco Use: Medium Risk (12/13/2023)    Readmission Risk Interventions     No data to display

## 2023-12-16 NOTE — Plan of Care (Signed)

## 2023-12-16 NOTE — Progress Notes (Signed)
 Mobility Specialist Progress Note:    12/16/23 1611  Mobility  Activity Transferred to/from [facility]  Level of Assistance Minimal assist, patient does 75% or more  Assistive Device Other (Comment) (HHA)  Distance Ambulated (ft) 5 ft  Activity Response Tolerated well  Mobility Referral Yes  Mobility visit 1 Mobility  Mobility Specialist Start Time (ACUTE ONLY) 1415  Mobility Specialist Stop Time (ACUTE ONLY) 1427  Mobility Specialist Time Calculation (min) (ACUTE ONLY) 12 min   Pt received in chair requesting assistance to the [facility]. Pt required MinA w/ HHA to stand and get to [facility]. No c/o throughout. Situated on [facility] w/ call bell in reach. Instructed to call out when finished. NT aware.  Thompson Grayer Mobility Specialist  Please contact vis Secure Chat or  Rehab Office [phone]

## 2023-12-16 NOTE — Care Management Important Message (Signed)
 Important Message  Patient Details  Name: Mathew Baker MRN: [id number] Date of Birth: 1941-06-26   Important Message Given:  Yes - Medicare IM     Dorena Bodo 12/16/2023, 3:37 PM

## 2023-12-16 NOTE — Progress Notes (Signed)
 Rounding Note    Patient Name: Mathew Baker Date of Encounter: 12/16/2023  [facility] Cardiologist: None  Chief Complaint:  Chief Complaint  Patient presents with   Shortness of Breath   Reason of consult: history of NSTEMI/recent coronary intervention and ? syncope.   Subjective   Denies anginal chest pain. Shortness of breath improving, baseline Has diuresed well during his hospitalization, I's and O's are not accurate.   Continues to have melena. No hematuria Daughter at bedside   Inpatient Medications    Scheduled Meds:  aspirin EC  81 mg Oral Daily   atorvastatin  80 mg Oral Daily   clopidogrel  75 mg Oral Daily   dextrose  1 ampule Intravenous Once   furosemide  40 mg Intravenous Daily   insulin aspart  0-15 Units Subcutaneous TID WC   insulin glargine-yfgn  10 Units Subcutaneous Daily   midodrine  10 mg Oral BID   pantoprazole  40 mg Oral BID   sodium chloride flush  3 mL Intravenous Q12H   tamsulosin  0.4 mg Oral QHS   Continuous Infusions:   PRN Meds: acetaminophen **OR** acetaminophen, albuterol, dextrose   Vital Signs    Vitals:   12/16/23 0024 12/16/23 0440 12/16/23 0744 12/16/23 0913  BP: 116/80 106/64 100/78 (!) 119/56  Pulse: 82 68 98   Resp: (!) 22 20 18 18   Temp: (!) 97.5 F (36.4 C) 97.7 F (36.5 C) 97.6 F (36.4 C)   TempSrc: Oral Oral Oral   SpO2: 97% 92% 95%   Weight:  110.2 kg    Height:        Intake/Output Summary (Last 24 hours) at 12/16/2023 1103 Last data filed at 12/16/2023 0845 Gross per 24 hour  Intake 1095 ml  Output 1040 ml  Net 55 ml      12/16/2023    4:40 AM 12/15/2023    3:35 AM 12/14/2023    5:37 AM  Last 3 Weights  Weight (lbs) 242 lb 15.2 oz 240 lb 11.9 oz 240 lb 8.4 oz  Weight (kg) 110.2 kg 109.2 kg 109.1 kg      Telemetry    Atrial fibrillation with controlled ventricular rate- Personally Reviewed  ECG    No new tracing- Personally Reviewed  Physical Exam   Physical Exam   Constitutional: No distress. He appears chronically ill.  hemodynamically stable  Neck: No JVD present.  Cardiovascular: Normal rate, S1 normal and S2 normal. An irregularly irregular rhythm present. Exam reveals no gallop, no S3 and no S4.  Murmur heard. Systolic murmur is present with a grade of 3/6 at the upper right sternal border. Holosystolic  murmur of grade 3/6 is also present at the apex. Pulses:      Carotid pulses are  on the right side with bruit and  on the left side with bruit. Pulmonary/Chest: Effort normal and breath sounds normal. No stridor. He has no wheezes. He has no rales.  Musculoskeletal:        General: Edema present.     Cervical back: Neck supple.  Skin: Skin is warm.   Labs    High Sensitivity Troponin:   Recent Labs  Lab 11/19/23 0836 12/05/23 1951 12/06/23 0313 12/13/23 0910 12/13/23 1050  TROPONINIHS >24,000* 116* 109* 520* 497*     Chemistry Recent Labs  Lab 12/13/23 0910 12/14/23 0231 12/15/23 0330 12/16/23 0315  NA 139 139 139 139  K 3.9 3.8 3.7 4.2  CL 107 107 106  106  CO2 24 27 27 25   GLUCOSE 62* 139* 107* 90  BUN 29* 30* 29* 25*  CREATININE 1.00 1.03 0.92 0.95  CALCIUM 7.9* 7.8* 8.0* 7.8*  PROT 5.3*  --   --   --   ALBUMIN 2.7*  --   --   --   AST 31  --   --   --   ALT 21  --   --   --   ALKPHOS 56  --   --   --   BILITOT 1.0  --   --   --   GFRNONAA >60 >60 >60 >60  ANIONGAP 8 5 6 8     Lipids No results for input(s): "CHOL", "TRIG", "HDL", "LABVLDL", "LDLCALC", "CHOLHDL" in the last 168 hours.  Hematology Recent Labs  Lab 12/14/23 1343 12/15/23 0330 12/16/23 0315  WBC 5.8 5.5 6.5  RBC 2.66*  2.61* 2.49* 2.51*  HGB 8.0* 7.7* 7.6*  HCT 26.2* 24.6* 24.8*  MCV 98.5 98.8 98.8  MCH 30.1 30.9 30.3  MCHC 30.5 31.3 30.6  RDW 28.2* 27.7* 26.9*  PLT 201 190 197   Thyroid No results for input(s): "TSH", "FREET4" in the last 168 hours.  BNP Recent Labs  Lab 12/13/23 0910  BNP 540.4*    DDimer No results for  input(s): "DDIMER" in the last 168 hours.   Radiology    NA  Cardiac Studies   LHC 11/21/2023   Prox RCA lesion is 75% stenosed.   Mid RCA-1 lesion is 75% stenosed.   Mid RCA-2 lesion is 75% stenosed.   Mid RCA to Dist RCA lesion is 90% stenosed.   Dist RCA lesion is 50% stenosed with 50% stenosed side branch in RPAV.   Ost RCA to Prox RCA lesion is 50% stenosed.   Ost LM to Mid LM lesion is 70% stenosed.   Dist LM lesion is 90% stenosed.   Ost Cx lesion is 100% stenosed.   Ost LAD to Prox LAD lesion is 95% stenosed.   Prox LAD to Dist LAD lesion is 50% stenosed.   2nd Diag lesion is 75% stenosed.   There is moderate to severe left ventricular systolic dysfunction.   LV end diastolic pressure is moderately elevated.   The left ventricular ejection fraction is 25-35% by visual estimate.   Anticipated discharge date to be determined.   Recommend to resume Apixaban, at currently prescribed dose and frequency.   Conclusion Left heart cath right radial approach Non-STEMI shortness of breath   Left ventriculogram Left ventricular enlargement global hypokinesis EF around 30%   Coronaries Left main large moderate to severe calcification 70% distal LAD large 90% ostial 95% proximal diffuse 50% moderate to severe calcification proximally Circumflex 100% occluded ostially moderate to severe calcification RCA large serial 75% lesions mid heavy calcification 90% mid to distal moderate calcification right dominant system moderate disease diffusely distally   Intervention deferred Recommend evaluation for high risk coronary bypass surgery versus high risk PCI and stent for complex disease Medical therapy is also not unreasonable Will discuss images with tertiary care facility   Echocardiogram 11/19/2023 IMPRESSIONS  1. Left ventricular ejection fraction, by estimation, is 25 to 30%. The  left ventricle has severely decreased function. The left ventricle  demonstrates global  hypokinesis. There is mild concentric left ventricular  hypertrophy. Left ventricular diastolic   parameters are consistent with Grade I diastolic dysfunction (impaired  relaxation). Elevated left ventricular end-diastolic pressure.   2. Right ventricular systolic function is  normal. The right ventricular  size is normal. There is normal pulmonary artery systolic pressure.   3. Left atrial size was moderately dilated.   4. The mitral valve is normal in structure. Mild mitral valve  regurgitation. No evidence of mitral stenosis.   5. Dimensionless index in 0.23, consistent with severe aortic stenosis.  The severity of the aortic stenosis is likely underestimated by mean  gradient due to low flow caused by systolic dysfunction.. The aortic valve  is calcified. There is severe  calcifcation of the aortic valve. There is severe thickening of the aortic  valve. Aortic valve regurgitation is mild to moderate. Severe aortic valve  stenosis. Aortic valve mean gradient measures 21.0 mmHg. Aortic valve Vmax  measures 2.95 m/s.   6. The inferior vena cava is dilated in size with <50% respiratory  variability, suggesting right atrial pressure of 15 mmHg.    PCI 3/19 -- [facility] Planned PCI of the LAD and LM. Patient known to have severe AS and had repeat [facility] echo to evaluate. Placed a 50cc IABP at the beginning of the case via ultrasound guidance for support. Obtained right radial artery access. Primary wired the LM into LAD  with a rota floppy wire. Performed 4 runs of rotational atherectomy with a 1.5 burr. Post-dilated with a 2.5 then a 3.0 x 20 [place]. IVUS revealed severe concentric calcification with fractured calcium and distal diameter of 3.0, Stent with a 3.0 x 38  Synergy overlapped proximally with a  4.0 x 16 [place], and post-dilated with a 3.0 and 4.0 [place]. IABP sutured in placed.   Recommendations:  Successful PCI of the LM into LCx. Plan for RCA PCI on Friday with Dr. Yetta Barre. Recommend leaving  IABP in place if tolerated until PCI Friday. DAPT for a year if tolerated. Begin evaluation for TAVR.    PCI 3/24 -- [facility] Severely calcified RCA. IABP removed over the weekend, so replaced it for the PCI today via left common femoral artery. Primary wired the RCA with the viper wire. Performed 5 runs of orbital atherectomy to the distal and proximal RCA. Post-dilated with a  2.5 and 3.0 [place]. IVUS demonstrated distal reference diameter of 3.2-3.4 so placed a 3.0 x 38 Synergy stent overlapped with a 3.5 x38 Synergy stent, and post-dilated with a 3.5 [place] and 4.0 [place]. IABP removed at the end of the case and perclosed.  Recommendations: DAPT for at least a year. Evaluation of aortic stenosis for need for TAVR. Further management per primary team.  Patient Profile     83 y.o. male  with a hx of CAD s/p NSTEMI 11/2023 s/p multivessel PCI, HTN, HLD, severe low-flow, low-gradient AS, chronic HFrEF, diabetes, COPD, history of CVA, atrial fibrillation, orthostatic hypotension, hepatocellular carcinoma (treated  in October 2023), BPH, urinary retention.  Assessment & Plan    Impression:  Acute on chronic heart failure with reduced EF  Ischemic cardiomyopathy Recent NSTEMI status post multivessel PCI Near-syncope/falls Melanotic stools, gross hematuria secondary to traumatic Foley/positive fecal occult blood/status post PRBCs/suspected GI bleed Elevated troponins, demand ischemia in the setting of cardiomyopathy, recent NSTEMI, and suspected GI bleed Aortic stenosis, low-flow low gradient Atrial fibrillation, controlled ventricular rate History of orthostatic hypotension, on midodrine Uncontrolled diabetes mellitus type 2-presents with hypoglycemia History of tubular adenoma   Recommendations:  Acute on chronic heart failure with reduced EF  Ischemic cardiomyopathy Recent NSTEMI status post multivessel PCI Elevated troponins, demand ischemia in the setting of cardiomyopathy, recent NSTEMI, and  suspected GI bleed /  recent GU bleeding Optimized from a heart failure standpoint, stage C, NYHA class II No anginal chest pain. EKG nonischemic. Troponins above normal limits but likely secondary to demand ischemia. Patient underwent complex PCI to the left main/LCx and most recent to the RCA on November 28, 2023. Recommend at least dual antiplatelet therapy for 1 month from his last stent on November 28, 2023 to decrease chance of stent thrombosis. Until April 24,2025 and thereafter monotherapy alone w/ Plavix 75mg  po qday as long as HB is stable. Recommended they follow up w/ primary cardiology [facility] prior to 12/29/2023 to discuss antiplatelet/anticoagulation management going forward. Daughter and patient verbalized understanding.  Spoke to interventional colleagues (12/14/2023) as well regarding his case who agree that given the recent ACS and multivessel PCI w/ unprotected left main 1 month of DAPT is reasonable if Hb remains stable. And if he continues to drop his Hb despite holding OAC than even holding antiplatelets will need to be reconsidered.  Uptitration of GDMT has been difficult due to hypotension, as per [facility] records in Care Everywhere. Had to increase midodrine due to soft BP  Continue Lasix 40mg  IVP w/ parameters Compression stockings and ambulation recommended.    Near-syncope/falls Multifactorial: Baseline orthostatic hypotension, hypoglycemia on arrival blood glucose 43, suspected GI bleed/anemia, deconditioning, history of falls, and underlying valvular heart disease in the setting of cardiomyopathy, etc. Continue midodrine, at home dose Check orthostatic vital signs -  negative.  Diabetes/hyperglycemia management per primary team Suspected GI bleed/anemia: continues have to melanotic stool. GI following, last note 12/13/2023 Gross hematuria secondary to traumatic Foley status post fall - resolved.  Aortic stenosis-being considered for TAVR at [facility] health as outpatient    Symptomatic anemia: melanotic stools, gross hematuria secondary to traumatic Foley/positive fecal occult blood/status post PRBCs/suspected GI bleed GI recommendations reviewed - they feel he is high risk for endoscopy and recommending conservative management at this time. Given his co-morbidities patient is at an elevated risk for cardiovascular complications; however, he is optimized since presentation. Given the fact he still has melanotic stools recommend re-evaluation by GI to see if endoscopies are recommended. Patient and Daughter understand that he is at an elevated risk for complications but the procedure is not prohibitive. Final decision to proceed w/ endoscopy as per the patient/daughter/GI team.  Status post 2 units of PRBCs while at [facility] Status post 1 unit of PRBC in the ER during this hospitalization    Aortic stenosis, low-flow low gradient: Being considered for TAVR workup as outpatient at [facility] health   Atrial fibrillation, controlled ventricular rate: Currently not on AV nodal blocking agents due to orthostasis and hypotension in the past, per EMR Not on antiarrhythmic medications.  Was on Eliquis for thromboembolic prophylaxis (POA).  However has required a total of 3 units of PRBCs in the recent past and the suspected source(s) either GI or GU due to traumatic foley.  His  high CHA2DS2-VASc SCORE does place him at higher risk for thromboembolic event.  However in the setting of suspected GI/GU bleed and anemia & requiring blood transfusion I had a long discussion with patient and his daughter Mathew Baker regarding antiplatelets and anticoagulation management.     After discussing the risks, benefits, alternatives to oral anticoagulation and antiplatelets both patient and Mathew Baker participated in a shared decision-making process to continue antiplatelets given his recent NSTEMI and undergoing multivessel PCI to left main/Lcx and RCA and to monitor H&H.   Shared decision is to stop  Eliquis for now. Re-trail with  OAC could be considered after bleeding sources are evaluated/corrected and hemoglobin stabilizes or consider outpatient workup for left atrial appendage occlusion device.    They understand that not being on anticoagulation predisposes him to possible thromboembolic events (i.e. stroke)and therefore needs to be more cognizant and to seek medical attention if such symptoms arise.   He will need close follow up w/ cardiology, GI, and PCP post discharge.    History of orthostatic hypotension, on midodrine dose increased to 10mg  po bid by attending physician   Uncontrolled diabetes mellitus type 2-presents with hypoglycemia A1c 9.1, as of 11/2023 Was hypoglycemic on arrival. Diabetes management per primary   History of tubular adenoma: Management per primary and GI service  Primary team to discuss goals of care and code status with patient and family. Will follow the patient with you.    For questions or updates, please contact [facility] Please consult [email or web address] for contact info under     Updated RN, patient, and daughter during morning rounds.   Signed, Tessa Lerner, DO, [facility]  [address] [place], [place] [address] Pager: [phone] Office: [phone] 12/16/2023, 11:03 AM

## 2023-12-16 NOTE — Progress Notes (Signed)
 PROGRESS NOTE    Mathew Baker  WGN:[id number] DOB: [birth year] DOA: 12/13/2023 PCP: Lauro Regulus, MD   82/M w hypertension, chronic systolic CHF, severe aortic stenosis, paroxysmal atrial fibrillation on chronic anticoagulation, COPD, T2DM, CVA, and hepatocellular carcinoma transferred from [facility] to [facility] on 3/18 for multivessel disease, underwent LHC 3/19 with PCI to X, DES to left main into circumflex, balloon pump, followed by staged PCI X2 to RCA on 3/24, discharged home on Plavix, statin, midodrine, also diagnosed with severe aortic stenosis, systolic CHF with EF 25-30% plan for TAVR eval, in addition had urinary retention, discharged with the Foley.  Presented to [facility] 3/31 with urinary retention and hematuria, hypotensive improved after fluid bolus, discharged on 4/3 after transfusion, Foley catheter removal etc. -Presented to [facility] ED 4/8 with worsening shortness of breath orthopnea, in the ED he was noted to be volume overloaded, EKG with known LBBB, A-fib, chest x-ray with small to moderate right effusion, BNP 540, troponin 520, 497, Hemoccult positive -Also reported black stools for few weeks  Subjective: Feels fair, breathing a little better  Assessment and Plan:  Acute on chronic systolic CHF Severe aortic stenosis -Last echocardiogram LVEF to be 25- 30% with grade 1 diastolic dysfunction, moderate dilated left atrium, severe aortic stenosis  -Improving with gentle diuresis, continue IV Lasix again today -GDMT limited by hypotension, on midodrine> dose increased -Conservative management -Follow-up with [facility] cardiology for TAVR eval -Increase activity   Elevated troponin CAD status post PCI -recent admission at [facility] with multivessel disease where he had successful staged PCI to LAD and RCA.  High-sensitivity troponins 520-> 497 -do not suspect ACS at this time -Continue aspirin, Plavix, statin   Acute on chronic anemia Melena -Recent admission transfused 2U PRBC,  then transfused 1 unit PRBC 4/8 -Ongoing intermittent melena in the setting of antiplatelet and anticoagulation therapy -Hemoglobin relatively stable at 7.6 today, reports some melena yesterday and the day before -Gastroenterology following, will discuss today -Continue PT PI, Eliquis on hold, given high-dose IV iron yesterday -Transfuse PRBC if repeat hemoglobin remains low   Paroxysmal atrial fibrillation Patient appears relatively rate controlled at this time.  CHA2DS2-VASc score of 7.  -Holding Eliquis in the setting of above   Uncontrolled Diabetes Mellitus type 2 with hypoglycemia,  Last hemoglobin A1c noted to be 9.1.  Home medication regimen includes Lantus 50 units twice daily.   -Decrease Lantus dose  Orthostatic hypotension Syncopal episodes Recurrent falls -Multifactorial in the setting of severe aortic stenosis, hypotension -Continue midodrine   BPH Patient reports voiding without any issue at this time. -Continue Flomax   History of tubular adenomas Last colonoscopy performed back in 2015 noted 2 tubular adenomas which were removed.   GERD -Protonix twice daily     DVT prophylaxis: SCDs Advance Care Planning: DNR Family Communication: Daughter at bedside Disposition: SNF pending above workup  Consultants:    Procedures:   Antimicrobials:    Objective: Vitals:   12/16/23 0024 12/16/23 0440 12/16/23 0744 12/16/23 0913  BP: 116/80 106/64 100/78 (!) 119/56  Pulse: 82 68 98   Resp: (!) 22 20 18 18   Temp: (!) 97.5 F (36.4 C) 97.7 F (36.5 C) 97.6 F (36.4 C)   TempSrc: Oral Oral Oral   SpO2: 97% 92% 95%   Weight:  110.2 kg    Height:        Intake/Output Summary (Last 24 hours) at 12/16/2023 1144 Last data filed at 12/16/2023 0845 Gross per 24 hour  Intake 1095  ml  Output 1040 ml  Net 55 ml   Filed Weights   12/14/23 0537 12/15/23 0335 12/16/23 0440  Weight: 109.1 kg 109.2 kg 110.2 kg    Examination:  General exam: Chronically ill male  sitting up in bed, AAOx3 feels fair, no events overnight HEENT: Positive CVS: S1-S2, irregular rhythm, systolic murmur Lungs: Decreased breath sounds at the bases otherwise clear Abdomen: Soft, nontender, bowel sounds present ext: Trace edema Skin: No rashes Psychiatry:  Mood & affect appropriate.     Data Reviewed:   CBC: Recent Labs  Lab 12/13/23 0910 12/14/23 1012 12/14/23 1343 12/15/23 0330 12/16/23 0315  WBC 6.0 5.4 5.8 5.5 6.5  NEUTROABS 5.2  --   --   --   --   HGB 7.0* 7.5* 8.0* 7.7* 7.6*  HCT 23.1* 24.7* 26.2* 24.6* 24.8*  MCV 106.9* 99.2 98.5 98.8 98.8  PLT 220 197 201 190 197   Basic Metabolic Panel: Recent Labs  Lab 12/13/23 0910 12/14/23 0231 12/15/23 0330 12/16/23 0315  NA 139 139 139 139  K 3.9 3.8 3.7 4.2  CL 107 107 106 106  CO2 24 27 27 25   GLUCOSE 62* 139* 107* 90  BUN 29* 30* 29* 25*  CREATININE 1.00 1.03 0.92 0.95  CALCIUM 7.9* 7.8* 8.0* 7.8*   GFR: Estimated Creatinine Clearance: 76.7 mL/min (by C-G formula based on SCr of 0.95 mg/dL). Liver Function Tests: Recent Labs  Lab 12/13/23 0910  AST 31  ALT 21  ALKPHOS 56  BILITOT 1.0  PROT 5.3*  ALBUMIN 2.7*   No results for input(s): "LIPASE", "AMYLASE" in the last 168 hours. No results for input(s): "AMMONIA" in the last 168 hours. Coagulation Profile: No results for input(s): "INR", "PROTIME" in the last 168 hours. Cardiac Enzymes: No results for input(s): "CKTOTAL", "CKMB", "CKMBINDEX", "TROPONINI" in the last 168 hours. BNP (last 3 results) No results for input(s): "PROBNP" in the last 8760 hours. HbA1C: No results for input(s): "HGBA1C" in the last 72 hours. CBG: Recent Labs  Lab 12/15/23 1107 12/15/23 1615 12/15/23 2033 12/16/23 0630 12/16/23 1105  GLUCAP 144* 208* 140* 75 151*   Lipid Profile: No results for input(s): "CHOL", "HDL", "LDLCALC", "TRIG", "CHOLHDL", "LDLDIRECT" in the last 72 hours. Thyroid Function Tests: No results for input(s): "TSH", "T4TOTAL",  "FREET4", "T3FREE", "THYROIDAB" in the last 72 hours. Anemia Panel: Recent Labs    12/14/23 1341 12/14/23 1343  VITAMINB12  --  1,460*  FOLATE 20.8  --   FERRITIN  --  87  TIBC  --  330  IRON  --  47  RETICCTPCT  --  6.2*   Urine analysis:    Component Value Date/Time   COLORURINE RED (A) 12/06/2023 0428   APPEARANCEUR CLOUDY (A) 12/06/2023 0428   LABSPEC  12/06/2023 0428    TEST NOT REPORTED DUE TO COLOR INTERFERENCE OF URINE PIGMENT   PHURINE  12/06/2023 0428    TEST NOT REPORTED DUE TO COLOR INTERFERENCE OF URINE PIGMENT   GLUCOSEU (A) 12/06/2023 0428    TEST NOT REPORTED DUE TO COLOR INTERFERENCE OF URINE PIGMENT   HGBUR (A) 12/06/2023 0428    TEST NOT REPORTED DUE TO COLOR INTERFERENCE OF URINE PIGMENT   BILIRUBINUR (A) 12/06/2023 0428    TEST NOT REPORTED DUE TO COLOR INTERFERENCE OF URINE PIGMENT   KETONESUR (A) 12/06/2023 0428    TEST NOT REPORTED DUE TO COLOR INTERFERENCE OF URINE PIGMENT   PROTEINUR (A) 12/06/2023 0428    TEST NOT REPORTED DUE  TO COLOR INTERFERENCE OF URINE PIGMENT   NITRITE (A) 12/06/2023 0428    TEST NOT REPORTED DUE TO COLOR INTERFERENCE OF URINE PIGMENT   LEUKOCYTESUR (A) 12/06/2023 0428    TEST NOT REPORTED DUE TO COLOR INTERFERENCE OF URINE PIGMENT   Sepsis Labs: @LABRCNTIP (procalcitonin:4,lacticidven:4)  ) No results found for this or any previous visit (from the past 240 hours).    Radiology Studies: No results found.    Scheduled Meds:  aspirin EC  81 mg Oral Daily   atorvastatin  80 mg Oral Daily   clopidogrel  75 mg Oral Daily   dextrose  1 ampule Intravenous Once   furosemide  40 mg Intravenous Daily   insulin aspart  0-15 Units Subcutaneous TID WC   insulin glargine-yfgn  10 Units Subcutaneous Daily   midodrine  10 mg Oral BID   pantoprazole  40 mg Oral BID   sodium chloride flush  3 mL Intravenous Q12H   tamsulosin  0.4 mg Oral QHS   Continuous Infusions:     LOS: 3 days    Time spent:    Zannie Cove, MD [facility]   12/16/2023, 11:44 AM

## 2023-12-16 NOTE — Plan of Care (Signed)

## 2023-12-17 DIAGNOSIS — D649 Anemia, unspecified: Secondary | ICD-10-CM | POA: Diagnosis not present

## 2023-12-17 DIAGNOSIS — D500 Iron deficiency anemia secondary to blood loss (chronic): Secondary | ICD-10-CM | POA: Diagnosis not present

## 2023-12-17 DIAGNOSIS — I2510 Atherosclerotic heart disease of native coronary artery without angina pectoris: Secondary | ICD-10-CM | POA: Diagnosis not present

## 2023-12-17 DIAGNOSIS — I5023 Acute on chronic systolic (congestive) heart failure: Secondary | ICD-10-CM | POA: Diagnosis not present

## 2023-12-17 DIAGNOSIS — K921 Melena: Secondary | ICD-10-CM | POA: Diagnosis not present

## 2023-12-17 DIAGNOSIS — I255 Ischemic cardiomyopathy: Secondary | ICD-10-CM | POA: Diagnosis not present

## 2023-12-17 DIAGNOSIS — I5020 Unspecified systolic (congestive) heart failure: Secondary | ICD-10-CM | POA: Diagnosis not present

## 2023-12-17 LAB — PREPARE RBC (CROSSMATCH)

## 2023-12-17 LAB — BASIC METABOLIC PANEL WITH GFR
Anion gap: 7 (ref 5–15)
BUN: 26 mg/dL — ABNORMAL HIGH (ref 8–23)
CO2: 28 mmol/L (ref 22–32)
Calcium: 7.7 mg/dL — ABNORMAL LOW (ref 8.9–10.3)
Chloride: 103 mmol/L (ref 98–111)
Creatinine, Ser: 1.07 mg/dL (ref 0.61–1.24)
GFR, Estimated: >60 mL/min (ref >60)
Glucose, Bld: 204 mg/dL — ABNORMAL HIGH (ref 70–99)
Potassium: 3.9 mmol/L (ref 3.5–5.1)
Sodium: 138 mmol/L (ref 135–145)

## 2023-12-17 LAB — CBC
HCT: 25 % — ABNORMAL LOW (ref 39.0–52.0)
Hemoglobin: 7.5 g/dL — ABNORMAL LOW (ref 13.0–17.0)
MCH: 29.8 pg (ref 26.0–34.0)
MCHC: 30 g/dL (ref 30.0–36.0)
MCV: 99.2 fL (ref 80.0–100.0)
Platelets: 208 10*3/uL (ref 150–400)
RBC: 2.52 MIL/uL — ABNORMAL LOW (ref 4.22–5.81)
RDW: 26.5 % — ABNORMAL HIGH (ref 11.5–15.5)
WBC: 5.1 10*3/uL (ref 4.0–10.5)
nRBC: 0.4 % — ABNORMAL HIGH (ref 0.0–0.2)

## 2023-12-17 LAB — GLUCOSE, CAPILLARY
Glucose-Capillary: 158 mg/dL — ABNORMAL HIGH (ref 70–99)
Glucose-Capillary: 258 mg/dL — ABNORMAL HIGH (ref 70–99)
Glucose-Capillary: 236 mg/dL — ABNORMAL HIGH (ref 70–99)
Glucose-Capillary: 118 mg/dL — ABNORMAL HIGH (ref 70–99)

## 2023-12-17 LAB — HEMOGLOBIN AND HEMATOCRIT, BLOOD
HCT: 28.9 % — ABNORMAL LOW (ref 39.0–52.0)
Hemoglobin: 8.8 g/dL — ABNORMAL LOW (ref 13.0–17.0)

## 2023-12-17 MED ORDER — FUROSEMIDE 10 MG/ML IJ SOLN
60.0000 mg | Freq: Every day | INTRAMUSCULAR | Status: DC
  Administered 2023-12-18 – 2023-12-21 (×4): 60 mg via INTRAVENOUS
  Filled 2023-12-17 (×5): qty 6

## 2023-12-17 MED ORDER — SODIUM CHLORIDE 0.9% IV SOLUTION: Freq: Once | INTRAVENOUS | Status: AC

## 2023-12-17 NOTE — Progress Notes (Signed)
 [facility] Progress Note  Chief Complaint: Chronic blood loss anemia, melena  History:  Patient had dark stool this morning, some remnants of it in the toilet, formed and dark, not melena. Patient denies abdominal pain.  Says his breathing is about the same as yesterday, currently denies chest pain.  Spoke to internal medicine about him yesterday, they are going to transfuse 1 unit PRBCs today. Objective:   Current Facility-Administered Medications:    0.9 %  sodium chloride infusion (Manually program via Guardrails IV Fluids), , Intravenous, Once, Joseph, Preetha, MD   acetaminophen (TYLENOL) tablet 650 mg, 650 mg, Oral, Q6H PRN **OR** acetaminophen (TYLENOL) suppository 650 mg, 650 mg, Rectal, Q6H PRN, Felipe Horton, Rondell A, MD   albuterol (PROVENTIL) (2.5 MG/3ML) 0.083% nebulizer solution 2.5 mg, 2.5 mg, Nebulization, Q6H PRN, Felipe Horton, Rondell A, MD   aspirin EC tablet 81 mg, 81 mg, Oral, Daily, Parcells, Taylor A, PA-C, 81 mg at 12/17/23 1022   atorvastatin (LIPITOR) tablet 80 mg, 80 mg, Oral, Daily, Felipe Horton, Rondell A, MD, 80 mg at 12/17/23 1022   clopidogrel (PLAVIX) tablet 75 mg, 75 mg, Oral, Daily, Parcells, Taylor A, PA-C, 75 mg at 12/17/23 1022   dextrose 50 % solution 50 mL, 1 ampule, Intravenous, Once, Smith, Rondell A, MD   dextrose 50 % solution 50 mL, 1 ampule, Intravenous, PRN, Lena Qualia, MD   [START ON 12/18/2023] furosemide (LASIX) injection 60 mg, 60 mg, Intravenous, Daily, Chandrasekhar, Mahesh A, MD   insulin aspart (novoLOG) injection 0-15 Units, 0-15 Units, Subcutaneous, TID WC, Smith, Rondell A, MD, 3 Units at 12/17/23 0602   insulin glargine-yfgn (SEMGLEE) injection 6 Units, 6 Units, Subcutaneous, Daily, Deforest Fast, MD, 6 Units at 12/17/23 1022   midodrine (PROAMATINE) tablet 10 mg, 10 mg, Oral, BID, Joseph, Preetha, MD, 10 mg at 12/17/23 1022   pantoprazole (PROTONIX) EC tablet 40 mg, 40 mg, Oral, BID, Smith, Rondell A, MD, 40 mg at 12/17/23 1022   sodium  chloride flush (NS) 0.9 % injection 3 mL, 3 mL, Intravenous, Q12H, Smith, Rondell A, MD, 3 mL at 12/17/23 1024   tamsulosin (FLOMAX) capsule 0.4 mg, 0.4 mg, Oral, QHS, Smith, Rondell A, MD, 0.4 mg at 12/16/23 2132     Vital signs in last 24 hrs: Vitals:   12/17/23 0735 12/17/23 1138  BP: (!) 105/54 (!) 101/55  Pulse: 74 76  Resp: 20 20  Temp: 97.8 F (36.6 C) 98.1 F (36.7 C)  SpO2: 95% 98%    Intake/Output Summary (Last 24 hours) at 12/17/2023 1236 Last data filed at 12/17/2023 1100 Gross per 24 hour  Intake 1320 ml  Output 1500 ml  Net -180 ml     Physical Exam Sitting up at bedside, eating lunch, good spirits.  Looks comfortable  Cardiac: Irregular, systolic murmur,, + peripheral edema Pulm: clear to auscultation bilaterally, normal RR and effort noted Abdomen: soft, no tenderness, with active bowel sounds. No guarding or palpable hepatosplenomegaly Skin; warm and dry, pale, no jaundice  Recent Labs:     Latest Ref Rng & Units 12/17/2023    3:41 AM 12/16/2023   12:32 PM 12/16/2023    3:15 AM  CBC  WBC 4.0 - 10.5 K/uL 5.1  5.5  6.5   Hemoglobin 13.0 - 17.0 g/dL 7.5  7.7  7.6   Hematocrit 39.0 - 52.0 % 25.0  25.4  24.8   Platelets 150 - 400 K/uL 208  209  197     No results for input(s): "INR" in  the last 168 hours.    Latest Ref Rng & Units 12/17/2023    3:41 AM 12/16/2023    3:15 AM 12/15/2023    3:30 AM  CMP  Glucose 70 - 99 mg/dL 782  90  956   BUN 8 - 23 mg/dL 26  25  29    Creatinine 0.61 - 1.24 mg/dL 2.13  0.86  5.78   Sodium 135 - 145 mmol/L 138  139  139   Potassium 3.5 - 5.1 mmol/L 3.9  4.2  3.7   Chloride 98 - 111 mmol/L 103  106  106   CO2 22 - 32 mmol/L 28  25  27    Calcium 8.9 - 10.3 mg/dL 7.7  7.8  8.0      Radiologic studies:   Assessment & Plan  Assessment: 82/M w hypertension, chronic systolic CHF, severe aortic stenosis, paroxysmal atrial fibrillation on chronic anticoagulation, COPD, T2DM, CVA, and hepatocellular carcinoma transferred  from [facility] to [facility] on 3/18 for multivessel disease, underwent LHC 3/19 with PCI to X, DES to left main into circumflex, balloon pump, followed by staged PCI X2 to RCA on 3/24, discharged home on Plavix, statin, midodrine, also diagnosed with severe aortic stenosis, systolic CHF with EF 25-30% plan for TAVR eval, in addition had urinary retention, discharged with the Foley.  Presented to [facility] 3/31 with urinary retention and hematuria, hypotensive improved after fluid bolus, discharged on 4/3 after transfusion, Foley catheter removal etc. -Presented to [facility] ED 4/8 with worsening shortness of breath orthopnea, in the ED he was noted to be volume overloaded, EKG with known LBBB, A-fib, chest x-ray with small to moderate right effusion, BNP 540, troponin 520, 497, Hemoccult positive  Chronic blood loss anemia with melena, it is significantly slowed down since admission after stopping Plavix and Eliquis. Bleeding is significantly slowed but does not appear to have stopped yet. Tough situation-needs endoscopic evaluation but high risk procedures.  Plan:  I offered him an upper endoscopy on Monday.  I reviewed the case with one of our anesthesiologist, and they agree that if this patient is clinically stable and not having brisk GI bleeding (and he is not), then it would be best to put off his EGD until Monday to have fully staffed anesthesia services for a high risk sedation case.  This gives us  a chance to transfuse 1 unit of PRBCs today, diurese him as needed for that, watch his volume status and see his blood counts tomorrow.  If he needs further blood that can be attended to tomorrow in anticipation of EGD Monday. We will check back tomorrow  Kerby Pearson III Office: [phone]

## 2023-12-17 NOTE — Plan of Care (Signed)

## 2023-12-17 NOTE — Progress Notes (Signed)
 Progress Note  Patient Name: Mathew Baker Date of Encounter: 12/17/2023 Primary Cardiologist[facility] [place]  Subjective   Mathew Baker is an 83 year old with melanoma and anemia of chronic blood loss who presents for a GI evaluation related to ongoing GI bleed. He is accompanied by his daughter and granddaughter today.  He has experienced near syncope and multiple falls, raising concerns about orthostatic hypotension. Due to hypotension, he is unable to tolerate goal-directed medical therapy.  He has severe aortic stenosis and is being evaluated for a transcatheter aortic valve replacement procedure at [facility], although he is not considered an optimal candidate due to his complex medical history, including frequent melanoma and recent PCI. He has a do-not-resuscitate order with limited interventions. His primary cardiologist is based at [facility].  We have discussed my concerns with him receiving this intervention.  Breathing is baseline today.  Daughter notes his speech sounds different. Still having melena  Vital Signs    Vitals:   12/16/23 1615 12/16/23 2126 12/17/23 0054 12/17/23 0735  BP: 99/72  99/67 (!) 105/54  Pulse: 89 84 63 74  Resp: 18 16 18 20   Temp: 98 F (36.7 C) 97.7 F (36.5 C) (!) 97.4 F (36.3 C) 97.8 F (36.6 C)  TempSrc: Oral Oral Oral Oral  SpO2: 99% 97% 97% 95%  Weight:      Height:        Intake/Output Summary (Last 24 hours) at 12/17/2023 0955 Last data filed at 12/17/2023 0840 Gross per 24 hour  Intake 1320 ml  Output 1400 ml  Net -80 ml   Filed Weights   12/14/23 0537 12/15/23 0335 12/16/23 0440  Weight: 109.1 kg 109.2 kg 110.2 kg    Physical Exam   GEN: No acute distress.   Cardiac: RRR, no rubs, or gallops. Systolic crescendo murmur Respiratory: Clear to auscultation bilaterally. GI: Soft, nontender, non-distended  MS: No edema  Labs   Telemetry: Sinus rhythm with rare sinus bradycardia (12/17/2023)    Chemistry Recent Labs  Lab 12/13/23 0910 12/14/23 0231 12/15/23 0330 12/16/23 0315 12/17/23 0341  NA 139   < > 139 139 138  K 3.9   < > 3.7 4.2 3.9  CL 107   < > 106 106 103  CO2 24   < > 27 25 28   GLUCOSE 62*   < > 107* 90 204*  BUN 29*   < > 29* 25* 26*  CREATININE 1.00   < > 0.92 0.95 1.07  CALCIUM 7.9*   < > 8.0* 7.8* 7.7*  PROT 5.3*  --   --   --   --   ALBUMIN 2.7*  --   --   --   --   AST 31  --   --   --   --   ALT 21  --   --   --   --   ALKPHOS 56  --   --   --   --   BILITOT 1.0  --   --   --   --   GFRNONAA >60   < > >60 >60 >60  ANIONGAP 8   < > 6 8 7    < > = values in this interval not displayed.     Hematology Recent Labs  Lab 12/16/23 0315 12/16/23 1232 12/17/23 0341  WBC 6.5 5.5 5.1  RBC 2.51* 2.57* 2.52*  HGB 7.6* 7.7* 7.5*  HCT 24.8* 25.4* 25.0*  MCV 98.8 98.8  99.2  MCH 30.3 30.0 29.8  MCHC 30.6 30.3 30.0  RDW 26.9* 26.8* 26.5*  PLT 197 209 208    Cardiac EnzymesNo results for input(s): "TROPONINI" in the last 168 hours. No results for input(s): "TROPIPOC" in the last 168 hours.   BNP Recent Labs  Lab 12/13/23 0910  BNP 540.4*     DDimer No results for input(s): "DDIMER" in the last 168 hours.   Cardiac Studies   Cardiac Studies & Procedures   ______________________________________________________________________________________________ CARDIAC CATHETERIZATION  CARDIAC CATHETERIZATION 11/21/2023  Narrative   Prox RCA lesion is 75% stenosed.   Mid RCA-1 lesion is 75% stenosed.   Mid RCA-2 lesion is 75% stenosed.   Mid RCA to Dist RCA lesion is 90% stenosed.   Dist RCA lesion is 50% stenosed with 50% stenosed side branch in RPAV.   Ost RCA to Prox RCA lesion is 50% stenosed.   Ost LM to Mid LM lesion is 70% stenosed.   Dist LM lesion is 90% stenosed.   Ost Cx lesion is 100% stenosed.   Ost LAD to Prox LAD lesion is 95% stenosed.   Prox LAD to Dist LAD lesion is 50% stenosed.   2nd Diag lesion is 75% stenosed.   There is  moderate to severe left ventricular systolic dysfunction.   LV end diastolic pressure is moderately elevated.   The left ventricular ejection fraction is 25-35% by visual estimate.   Anticipated discharge date to be determined.   Recommend to resume Apixaban, at currently prescribed dose and frequency.  Conclusion Left heart cath right radial approach Non-STEMI shortness of breath  Left ventriculogram Left ventricular enlargement global hypokinesis EF around 30%  Coronaries Left main large moderate to severe calcification 70% distal LAD large 90% ostial 95% proximal diffuse 50% moderate to severe calcification proximally Circumflex 100% occluded ostially moderate to severe calcification RCA large serial 75% lesions mid heavy calcification 90% mid to distal moderate calcification right dominant system moderate disease diffusely distally  Intervention deferred Recommend evaluation for high risk coronary bypass surgery versus high risk PCI and stent for complex disease Medical therapy is also not unreasonable Will discuss images with tertiary care facility  Findings Coronary Findings Diagnostic  Dominance: Right  Left Main Ost LM to Mid LM lesion is 70% stenosed. Vessel is not the culprit lesion. The lesion is type C, located at the major branch and discrete. The lesion is moderately calcified. Dist LM lesion is 90% stenosed. Vessel is not the culprit lesion. The lesion is type C and located at the bifurcation. The lesion is moderately calcified.  Left Anterior Descending Ost LAD to Prox LAD lesion is 95% stenosed. The lesion is moderately calcified. Prox LAD to Dist LAD lesion is 50% stenosed. Vessel is not the culprit lesion. The lesion is type C and concentric. The lesion is mildly calcified. The lesion was not previously treated . The stenosis was measured by a visual reading. Pressure gradient was not performed on the lesion. IVUS was not performed. No optical coherence tomography  (OCT) was performed.  Second Diagonal Branch 2nd Diag lesion is 75% stenosed. Vessel is not the culprit lesion. The lesion is type C and discrete.  Left Circumflex Ost Cx lesion is 100% stenosed. Vessel is not the culprit lesion. The lesion is not complex (non high-C), located at the bifurcation, discrete and chronically occluded. The lesion is severely calcified.  Right Coronary Artery Ost RCA to Prox RCA lesion is 50% stenosed. The lesion is moderately calcified. Prox  RCA lesion is 75% stenosed. The lesion is irregular. The lesion is severely calcified. Mid RCA-1 lesion is 75% stenosed. The lesion is severely calcified. Mid RCA-2 lesion is 75% stenosed. The lesion is severely calcified. Mid RCA to Dist RCA lesion is 90% stenosed. The lesion is moderately calcified. Dist RCA lesion is 50% stenosed with 50% stenosed side branch in RPAV. Vessel is not the culprit lesion. The lesion is eccentric. The lesion was not previously treated . The stenosis was measured by a visual reading.  Intervention  No interventions have been documented.   STRESS TESTS  NM MYOCAR MULTI W/SPECT W 12/21/2016  Narrative CLINICAL DATA:  Chest pain  EXAM: MYOCARDIAL IMAGING WITH SPECT (REST AND PHARMACOLOGIC-STRESS)  GATED LEFT VENTRICULAR WALL MOTION STUDY  LEFT VENTRICULAR EJECTION FRACTION  TECHNIQUE: Standard myocardial SPECT imaging was performed after resting intravenous injection of 10 mCi Tc-22m tetrofosmin. Subsequently, intravenous infusion of Lexiscan was performed under the supervision of the Cardiology staff. At peak effect of the drug, 30 mCi Tc-70m tetrofosmin was injected intravenously and standard myocardial SPECT imaging was performed. Quantitative gated imaging was also performed to evaluate left ventricular wall motion, and estimate left ventricular ejection fraction.  COMPARISON:  None.  FINDINGS: Perfusion: There is fixed thinning of the inferior wall. Attenuation artifact  is present at the apex. No stress-induced ischemia.  Wall Motion: Normal left ventricular wall motion. No left ventricular dilation.  Left Ventricular Ejection Fraction: 57 %  End diastolic volume 104 ml  End systolic volume 45 ml  IMPRESSION: 1. There is fixed thinning of the inferior wall and attenuation artifact at the apex. No stress-induced ischemia.  2. Normal left ventricular wall motion.  3. Left ventricular ejection fraction 57%  4. Non invasive risk stratification*: Low risk.  *2012 Appropriate Use Criteria for Coronary Revascularization Focused Update: J Am Coll Cardiol. 2012;59(9):857-881. http://content.dementiazones.com.aspx?articleid=1201161   Electronically Signed By: Artice Last M.D. On: 12/21/2016 13:58   ECHOCARDIOGRAM  ECHOCARDIOGRAM COMPLETE 11/19/2023  Narrative ECHOCARDIOGRAM REPORT    Patient Name:   QUAVON KEISLING Date of Exam: 11/19/2023 Medical Rec #:  [id number]           Height: Accession #:    [id number]          Weight: Date of Birth:  1941-08-11           BSA: Patient Age:    82 years            BP:           99/64 mmHg Patient Gender: M                   HR:           85 bpm. Exam Location:  [facility]  Procedure: 2D Echo, Cardiac Doppler and Color Doppler (Both Spectral and Color Flow Doppler were utilized during procedure).  Indications:     Acutre respiratory distress R06.03  History:         Patient has prior history of Echocardiogram examinations. Stroke; Risk Factors:Hypertension.  Sonographer:     Kathaleen Pale Roar Referring Phys:  Maudine Sos MD Diagnosing Phys: Maudine Sos MD  IMPRESSIONS   1. Left ventricular ejection fraction, by estimation, is 25 to 30%. The left ventricle has severely decreased function. The left ventricle demonstrates global hypokinesis. There is mild concentric left ventricular hypertrophy. Left ventricular diastolic parameters are consistent with Grade I diastolic dysfunction  (impaired relaxation). Elevated left ventricular end-diastolic pressure. 2. Right ventricular systolic function  is normal. The right ventricular size is normal. There is normal pulmonary artery systolic pressure. 3. Left atrial size was moderately dilated. 4. The mitral valve is normal in structure. Mild mitral valve regurgitation. No evidence of mitral stenosis. 5. Dimensionless index in 0.23, consistent with severe aortic stenosis. The severity of the aortic stenosis is likely underestimated by mean gradient due to low flow caused by systolic dysfunction.. The aortic valve is calcified. There is severe calcifcation of the aortic valve. There is severe thickening of the aortic valve. Aortic valve regurgitation is mild to moderate. Severe aortic valve stenosis. Aortic valve mean gradient measures 21.0 mmHg. Aortic valve Vmax measures 2.95 m/s. 6. The inferior vena cava is dilated in size with <50% respiratory variability, suggesting right atrial pressure of 15 mmHg.  FINDINGS Left Ventricle: Left ventricular ejection fraction, by estimation, is 25 to 30%. The left ventricle has severely decreased function. The left ventricle demonstrates global hypokinesis. The left ventricular internal cavity size was normal in size. There is mild concentric left ventricular hypertrophy. Left ventricular diastolic parameters are consistent with Grade I diastolic dysfunction (impaired relaxation). Elevated left ventricular end-diastolic pressure.  Right Ventricle: The right ventricular size is normal. No increase in right ventricular wall thickness. Right ventricular systolic function is normal. There is normal pulmonary artery systolic pressure. The tricuspid regurgitant velocity is 2.09 m/s, and with an assumed right atrial pressure of 15 mmHg, the estimated right ventricular systolic pressure is 32.5 mmHg.  Left Atrium: Left atrial size was moderately dilated.  Right Atrium: Right atrial size was normal in  size.  Pericardium: There is no evidence of pericardial effusion.  Mitral Valve: The mitral valve is normal in structure. Mild mitral valve regurgitation. No evidence of mitral valve stenosis.  Tricuspid Valve: The tricuspid valve is normal in structure. Tricuspid valve regurgitation is trivial. No evidence of tricuspid stenosis.  Aortic Valve: Dimensionless index in 0.23, consistent with severe aortic stenosis. The severity of the aortic stenosis is likely underestimated by mean gradient due to low flow caused by systolic dysfunction. The aortic valve is calcified. There is severe calcifcation of the aortic valve. There is severe thickening of the aortic valve. Aortic valve regurgitation is mild to moderate. Severe aortic stenosis is present. Aortic valve mean gradient measures 21.0 mmHg. Aortic valve peak gradient measures 34.8 mmHg.  Pulmonic Valve: The pulmonic valve was normal in structure. Pulmonic valve regurgitation is not visualized. No evidence of pulmonic stenosis.  Aorta: The aortic root is normal in size and structure.  Venous: The inferior vena cava is dilated in size with less than 50% respiratory variability, suggesting right atrial pressure of 15 mmHg.  IAS/Shunts: No atrial level shunt detected by color flow Doppler.   LEFT VENTRICLE PLAX 2D LVIDd:         5.90 cm Diastology LVIDs:         5.00 cm LV e' medial:    3.90 cm/s LV PW:         1.10 cm LV E/e' medial:  34.1 LV IVS:        1.30 cm LV e' lateral:   4.65 cm/s LV E/e' lateral: 28.6   RIGHT VENTRICLE RV Basal diam:  3.60 cm RV Mid diam:    3.40 cm RV Length:      6.60 cm RV S prime:     1340.00 cm/s TAPSE (M-mode): 1.7 cm  LEFT ATRIUM LA diam:      4.00 cm LA Vol (A2C): 27.9 ml LA Vol (  A4C): 80.3 ml AORTIC VALVE                    PULMONIC VALVE AV Vmax:           295.00 cm/s  PV Vmax:       83.40 m/s AV Vmean:          173.000 cm/s PV Peak grad:  27822.2 mmHg AV VTI:            0.576 m AV Peak  Grad:      34.8 mmHg AV Mean Grad:      21.0 mmHg LVOT Vmax:         67.90 cm/s LVOT Vmean:        43.200 cm/s LVOT VTI:          0.134 m LVOT/AV VTI ratio: 0.23  AORTA Ao STJ diam: 2.9 cm Ao Asc diam: 3.90 cm  MV E velocity: 133.00 cm/s  TRICUSPID VALVE TV Peak grad:   17.5 mmHg TV Vmax:        2.09 m/s TR Peak grad:   17.5 mmHg TR Vmax:        209.00 cm/s  SHUNTS Systemic VTI: 0.13 m  Maudine Sos MD Electronically signed by Maudine Sos MD Signature Date/Time: 11/21/2023/5:53:04 AM    Final   TEE  ECHO TEE 09/15/2020  Narrative TRANSESOPHOGEAL ECHO REPORT    Patient Name:   Elenore Griffon Date of Exam: 09/15/2020 Medical Rec #:  [id number]     Height:       73.0 in Accession #:    [id number]    Weight:       231.0 lb Date of Birth:  08-15-1941     BSA:          2.288 m Patient Age:    79 years      BP:           129/66 mmHg Patient Gender: M             HR:           55 bpm. Exam Location:  [facility]  Procedure: Transesophageal Echo, Color Doppler, Cardiac Doppler and Saline Contrast Bubble Study  Indications:     Bacteremia 790.7/ R78.81  History:         Patient has prior history of Echocardiogram examinations, most recent 09/14/2020. Stroke; Risk Factors:Hypertension and Diabetes.  Sonographer:     Broadus Canes RDCS (AE) Referring Phys:  [id number] Ronney Cola Diagnosing Phys: Starlette Ebbs MD  PROCEDURE: After discussion of the risks and benefits of a TEE, an informed consent was obtained from the patient. TEE procedure time was 30 minutes. The transesophogeal probe was passed without difficulty through the esophogus of the patient. Imaged were obtained with the patient in a left lateral decubitus position. Sedation performed by different physician. Image quality was excellent. The patient's vital signs; including heart rate, blood pressure, and oxygen saturation; remained stable throughout the procedure. The patient developed no complications during the  procedure.  IMPRESSIONS   1. Left ventricular ejection fraction, by estimation, is 55 to 60%. The left ventricle has normal function. The left ventricle has no regional wall motion abnormalities. Left ventricular diastolic parameters were normal. 2. Right ventricular systolic function is normal. The right ventricular size is normal. 3. No left atrial/left atrial appendage thrombus was detected. 4. The mitral valve is normal in structure. Trivial mitral valve regurgitation. 5. The aortic valve is calcified. Aortic valve regurgitation  is trivial.  Conclusion(s)/Recommendation(s): No evidence of vegetation/infective endocarditis on this transesophageal echocardiogram.  FINDINGS Left Ventricle: Left ventricular ejection fraction, by estimation, is 55 to 60%. The left ventricle has normal function. The left ventricle has no regional wall motion abnormalities. The left ventricular internal cavity size was normal in size. Left ventricular diastolic parameters were normal.  Right Ventricle: The right ventricular size is normal. No increase in right ventricular wall thickness. Right ventricular systolic function is normal.  Left Atrium: Left atrial size was normal in size. No left atrial/left atrial appendage thrombus was detected.  Right Atrium: Right atrial size was normal in size.  Pericardium: There is no evidence of pericardial effusion.  Mitral Valve: The mitral valve is normal in structure. Trivial mitral valve regurgitation.  Tricuspid Valve: The tricuspid valve is grossly normal. Tricuspid valve regurgitation is trivial.  Aortic Valve: The aortic valve is calcified. Aortic valve regurgitation is trivial. There is no evidence of aortic valve vegetation.  Pulmonic Valve: The pulmonic valve was not well visualized. Pulmonic valve regurgitation is not visualized.  Aorta: The aortic root is normal in size and structure.  IAS/Shunts: No atrial level shunt detected by color flow Doppler.  Agitated saline contrast was given intravenously to evaluate for intracardiac shunting.  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 Currently on dual antiplatelet therapy (DAPT) to prevent stent thrombosis. Risks of bleeding and stent thrombosis discussed with him and his daughter due to multiple comorbidities. Plan to continue DAPT until December 29, 2023, and then transition to monotherapy with Plavix if hemoglobin remains stable. Multidisciplinary team involved in decision-making process. - See 12/16/23 notes for SDM meeting - Continue DAPT until December 29, 2023 - Transition to monotherapy with Plavix post-December 29, 2023, if hemoglobin stable  Severe Aortic Stenosis Severe aortic stenosis with symptomatic hypotension preventing goal-directed medical therapy. Not considered an optimal candidate for TAVR but being evaluated for the procedure at [facility]. Concerns about tolerating anticoagulation and being a poor candidate for TAVR due to frequent melanoma and recent PCI. Evaluation for TAVR is ongoing to determine candidacy. - outpatient f/u for TAVR after 12/29/23 if still within his goals of care - I do not believe he would be a candidate for TAVR at our institution; if his bleeding diathesis resolves and they wish to be evaluated here would first discuss him in our MD meeting prior to TAVR Scans  HFrEF - acute on chronic, hypervolemic - IV lasix 60 mg tomorrow  Symptomatic Hypotension Symptomatic hypotension with near syncope and multiple falls. Currently on midodrine to manage hypotension. Unable to tolerate goal-directed medical therapy due to hypotension. Monitoring for  orthostatic hypotension is necessary.  Anemia of chronic blood loss Chronic anemia with hemoglobin levels at 7.5 from 7.7. Ongoing GI bleed contributing to anemia. Planned GI evaluation to assess ongoing GI bleed and determine ability to tolerate DAPT. Collaboration with GI team is crucial for peri-procedural management of DAPT.  Melanoma Frequent melanoma complicating recent PCI and potential TAVR candidacy.  Goals of Care He has a DNR order with limited nature. Discussion about considering more conservative goals of care given his multiple comorbidities and current health status. The goal is to stabilize his condition for further discussions with his primary cardiology  team at [facility]. - I have reviewed my concerns about long term prognosis with patient and family and answer their questions; no changes in GOC made today    For questions or updates, please contact [facility] Please consult [email or web address] for contact info under Cardiology/STEMI.      Gloriann Larger, MD FASE [facility] Cardiologist [facility]  [address] [place], [address] [place], [place] [address] [phone]  9:55 AM

## 2023-12-17 NOTE — Progress Notes (Signed)
 PROGRESS NOTE    Mathew Baker  RUE:[id number] DOB: 04/29/1941 DOA: 12/13/2023 PCP: Jimmy Moulding, MD   82/M w hypertension, chronic systolic CHF, severe aortic stenosis, paroxysmal atrial fibrillation on chronic anticoagulation, COPD, T2DM, CVA, and hepatocellular carcinoma transferred from [facility] to [facility] on 3/18 for multivessel disease, underwent LHC 3/19 with PCI to X, DES to left main into circumflex, balloon pump, followed by staged PCI X2 to RCA on 3/24, discharged home on Plavix, statin, midodrine, also diagnosed with severe aortic stenosis, systolic CHF with EF 25-30% plan for TAVR eval, in addition had urinary retention, discharged with the Foley.  Presented to [facility] 3/31 with urinary retention and hematuria, hypotensive improved after fluid bolus, discharged on 4/3 after transfusion, Foley catheter removal etc. -Presented to [facility] ED 4/8 with worsening shortness of breath orthopnea, in the ED he was noted to be volume overloaded, EKG with known LBBB, A-fib, chest x-ray with small to moderate right effusion, BNP 540, troponin 520, 497, Hemoccult positive -Also reported black stools for few weeks  Subjective: Feels fair, breathing a little better  Assessment and Plan:  Acute on chronic systolic CHF Severe aortic stenosis -Last echocardiogram LVEF to be 25- 30% with grade 1 diastolic dysfunction, moderate dilated left atrium, severe aortic stenosis  -Improving with gentle diuresis, holding further diuretics today -GDMT limited by hypotension, on midodrine> dose increased -Conservative management -Follow-up with [facility] cardiology for TAVR eval -Increase activity   Elevated troponin CAD status post PCI -recent admission at [facility] with multivessel disease where he had successful staged PCI to LAD and RCA.  High-sensitivity troponins 520-> 497 -do not suspect ACS at this time -Continue aspirin, Plavix, statin   Acute on chronic anemia Melena -Recent admission transfused 2U PRBC,  then transfused 1 unit PRBC 4/8 -Ongoing intermittent melena -Hemoglobin relatively stable at 7.5 today, transfusing 1 unit of PRBC -Eliquis on hold, given IV iron as well   Paroxysmal atrial fibrillation Patient appears relatively rate controlled at this time.  CHA2DS2-VASc score of 7.  -Holding Eliquis in the setting of above   Uncontrolled Diabetes Mellitus type 2 with hypoglycemia,  Last hemoglobin A1c noted to be 9.1.  Home medication regimen includes Lantus 50 units twice daily.   -CBGs are stable  Orthostatic hypotension Syncopal episodes Recurrent falls -Multifactorial in the setting of severe aortic stenosis, hypotension -Continue midodrine   BPH Patient reports voiding without any issue at this time. -Continue Flomax   History of tubular adenomas Last colonoscopy performed back in 2015 noted 2 tubular adenomas which were removed.   GERD -Protonix twice daily     DVT prophylaxis: SCDs Advance Care Planning: DNR Family Communication: Daughter at bedside Disposition: SNF pending above workup  Consultants:    Procedures:   Antimicrobials:    Objective: Vitals:   12/16/23 1615 12/16/23 2126 12/17/23 0054 12/17/23 0735  BP: 99/72  99/67 (!) 105/54  Pulse: 89 84 63 74  Resp: 18 16 18 20   Temp: 98 F (36.7 C) 97.7 F (36.5 C) (!) 97.4 F (36.3 C) 97.8 F (36.6 C)  TempSrc: Oral Oral Oral Oral  SpO2: 99% 97% 97% 95%  Weight:      Height:        Intake/Output Summary (Last 24 hours) at 12/17/2023 1127 Last data filed at 12/17/2023 0840 Gross per 24 hour  Intake 1320 ml  Output 1000 ml  Net 320 ml   Filed Weights   12/14/23 0537 12/15/23 0335 12/16/23 0440  Weight: 109.1 kg 109.2  kg 110.2 kg    Examination:  General exam: Chronically ill male sitting up in bed, AAOx3 feels fair, no events overnight HEENT: Positive JVD CVS: S1-S2, irregular rhythm, systolic murmur Lungs: Decreased breath sounds at the bases otherwise clear Abdomen: Soft,  nontender, bowel sounds present ext: No edema Skin: No rashes Psychiatry:  Mood & affect appropriate.     Data Reviewed:   CBC: Recent Labs  Lab 12/13/23 0910 12/14/23 1012 12/14/23 1343 12/15/23 0330 12/16/23 0315 12/16/23 1232 12/17/23 0341  WBC 6.0   < > 5.8 5.5 6.5 5.5 5.1  NEUTROABS 5.2  --   --   --   --   --   --   HGB 7.0*   < > 8.0* 7.7* 7.6* 7.7* 7.5*  HCT 23.1*   < > 26.2* 24.6* 24.8* 25.4* 25.0*  MCV 106.9*   < > 98.5 98.8 98.8 98.8 99.2  PLT 220   < > 201 190 197 209 208   < > = values in this interval not displayed.   Basic Metabolic Panel: Recent Labs  Lab 12/13/23 0910 12/14/23 0231 12/15/23 0330 12/16/23 0315 12/17/23 0341  NA 139 139 139 139 138  K 3.9 3.8 3.7 4.2 3.9  CL 107 107 106 106 103  CO2 24 27 27 25 28   GLUCOSE 62* 139* 107* 90 204*  BUN 29* 30* 29* 25* 26*  CREATININE 1.00 1.03 0.92 0.95 1.07  CALCIUM 7.9* 7.8* 8.0* 7.8* 7.7*   GFR: Estimated Creatinine Clearance: 68.1 mL/min (by C-G formula based on SCr of 1.07 mg/dL). Liver Function Tests: Recent Labs  Lab 12/13/23 0910  AST 31  ALT 21  ALKPHOS 56  BILITOT 1.0  PROT 5.3*  ALBUMIN 2.7*   No results for input(s): "LIPASE", "AMYLASE" in the last 168 hours. No results for input(s): "AMMONIA" in the last 168 hours. Coagulation Profile: No results for input(s): "INR", "PROTIME" in the last 168 hours. Cardiac Enzymes: No results for input(s): "CKTOTAL", "CKMB", "CKMBINDEX", "TROPONINI" in the last 168 hours. BNP (last 3 results) No results for input(s): "PROBNP" in the last 8760 hours. HbA1C: No results for input(s): "HGBA1C" in the last 72 hours. CBG: Recent Labs  Lab 12/16/23 0630 12/16/23 1105 12/16/23 1629 12/16/23 2055 12/17/23 0608  GLUCAP 75 151* 214* 201* 158*   Lipid Profile: No results for input(s): "CHOL", "HDL", "LDLCALC", "TRIG", "CHOLHDL", "LDLDIRECT" in the last 72 hours. Thyroid Function Tests: No results for input(s): "TSH", "T4TOTAL", "FREET4",  "T3FREE", "THYROIDAB" in the last 72 hours. Anemia Panel: Recent Labs    12/14/23 1341 12/14/23 1343  VITAMINB12  --  1,460*  FOLATE 20.8  --   FERRITIN  --  87  TIBC  --  330  IRON  --  47  RETICCTPCT  --  6.2*   Urine analysis:    Component Value Date/Time   COLORURINE RED (A) 12/06/2023 0428   APPEARANCEUR CLOUDY (A) 12/06/2023 0428   LABSPEC  12/06/2023 0428    TEST NOT REPORTED DUE TO COLOR INTERFERENCE OF URINE PIGMENT   PHURINE  12/06/2023 0428    TEST NOT REPORTED DUE TO COLOR INTERFERENCE OF URINE PIGMENT   GLUCOSEU (A) 12/06/2023 0428    TEST NOT REPORTED DUE TO COLOR INTERFERENCE OF URINE PIGMENT   HGBUR (A) 12/06/2023 0428    TEST NOT REPORTED DUE TO COLOR INTERFERENCE OF URINE PIGMENT   BILIRUBINUR (A) 12/06/2023 0428    TEST NOT REPORTED DUE TO COLOR INTERFERENCE OF URINE PIGMENT  KETONESUR (A) 12/06/2023 0428    TEST NOT REPORTED DUE TO COLOR INTERFERENCE OF URINE PIGMENT   PROTEINUR (A) 12/06/2023 0428    TEST NOT REPORTED DUE TO COLOR INTERFERENCE OF URINE PIGMENT   NITRITE (A) 12/06/2023 0428    TEST NOT REPORTED DUE TO COLOR INTERFERENCE OF URINE PIGMENT   LEUKOCYTESUR (A) 12/06/2023 0428    TEST NOT REPORTED DUE TO COLOR INTERFERENCE OF URINE PIGMENT   Sepsis Labs: @LABRCNTIP (procalcitonin:4,lacticidven:4)  ) No results found for this or any previous visit (from the past 240 hours).    Radiology Studies: No results found.    Scheduled Meds:  sodium chloride   Intravenous Once   aspirin EC  81 mg Oral Daily   atorvastatin  80 mg Oral Daily   clopidogrel  75 mg Oral Daily   dextrose  1 ampule Intravenous Once   [START ON 12/18/2023] furosemide  60 mg Intravenous Daily   insulin aspart  0-15 Units Subcutaneous TID WC   insulin glargine-yfgn  6 Units Subcutaneous Daily   midodrine  10 mg Oral BID   pantoprazole  40 mg Oral BID   sodium chloride flush  3 mL Intravenous Q12H   tamsulosin  0.4 mg Oral QHS   Continuous Infusions:     LOS:  4 days    Time spent:    Deforest Fast, MD [facility]   12/17/2023, 11:27 AM

## 2023-12-18 DIAGNOSIS — D649 Anemia, unspecified: Secondary | ICD-10-CM | POA: Diagnosis not present

## 2023-12-18 DIAGNOSIS — I2510 Atherosclerotic heart disease of native coronary artery without angina pectoris: Secondary | ICD-10-CM | POA: Diagnosis not present

## 2023-12-18 DIAGNOSIS — I5020 Unspecified systolic (congestive) heart failure: Secondary | ICD-10-CM | POA: Diagnosis not present

## 2023-12-18 DIAGNOSIS — I255 Ischemic cardiomyopathy: Secondary | ICD-10-CM | POA: Diagnosis not present

## 2023-12-18 DIAGNOSIS — I5023 Acute on chronic systolic (congestive) heart failure: Secondary | ICD-10-CM | POA: Diagnosis not present

## 2023-12-18 LAB — BASIC METABOLIC PANEL WITH GFR
Anion gap: 7 (ref 5–15)
BUN: 26 mg/dL — ABNORMAL HIGH (ref 8–23)
CO2: 29 mmol/L (ref 22–32)
Calcium: 7.9 mg/dL — ABNORMAL LOW (ref 8.9–10.3)
Chloride: 104 mmol/L (ref 98–111)
Creatinine, Ser: 0.97 mg/dL (ref 0.61–1.24)
GFR, Estimated: >60 mL/min (ref >60)
Glucose, Bld: 129 mg/dL — ABNORMAL HIGH (ref 70–99)
Potassium: 3.7 mmol/L (ref 3.5–5.1)
Sodium: 140 mmol/L (ref 135–145)

## 2023-12-18 LAB — CBC
HCT: 28.5 % — ABNORMAL LOW (ref 39.0–52.0)
Hemoglobin: 8.6 g/dL — ABNORMAL LOW (ref 13.0–17.0)
MCH: 29.4 pg (ref 26.0–34.0)
MCHC: 30.2 g/dL (ref 30.0–36.0)
MCV: 97.3 fL (ref 80.0–100.0)
Platelets: 188 10*3/uL (ref 150–400)
RBC: 2.93 MIL/uL — ABNORMAL LOW (ref 4.22–5.81)
RDW: 26.6 % — ABNORMAL HIGH (ref 11.5–15.5)
WBC: 4.9 10*3/uL (ref 4.0–10.5)
nRBC: 0 % (ref 0.0–0.2)

## 2023-12-18 LAB — BPAM RBC
Blood Product Expiration Date: 202505102359
ISSUE DATE / TIME: 202504121414
Unit Type and Rh: 7300

## 2023-12-18 LAB — TYPE AND SCREEN
ABO/RH(D): B POS
Antibody Screen: NEGATIVE
Unit division: 0

## 2023-12-18 LAB — GLUCOSE, CAPILLARY
Glucose-Capillary: 113 mg/dL — ABNORMAL HIGH (ref 70–99)
Glucose-Capillary: 173 mg/dL — ABNORMAL HIGH (ref 70–99)
Glucose-Capillary: 224 mg/dL — ABNORMAL HIGH (ref 70–99)
Glucose-Capillary: 212 mg/dL — ABNORMAL HIGH (ref 70–99)

## 2023-12-18 NOTE — Progress Notes (Signed)
 Progress Note  Patient Name: Mathew Baker Date of Encounter: 12/18/2023 Primary Cardiologist: Howie Mackie: [name] [place]  Subjective   EGD planned for tomorrow Patient feels done.  I have attempted to see him multiple times today: has had diarrhea with melanotic stools He notes phlegm and family notes hoarse voice.  Vital Signs    Vitals:   12/17/23 2037 12/17/23 2039 12/18/23 0008 12/18/23 0009  BP: 114/62  109/64   Pulse: 66 76    Resp: 18  18   Temp: 98 F (36.7 C)  98 F (36.7 C) 98 F (36.7 C)  TempSrc: Oral  Oral Oral  SpO2: 100% 100%    Weight:      Height:        Intake/Output Summary (Last 24 hours) at 12/18/2023 0824 Last data filed at 12/17/2023 2215 Gross per 24 hour  Intake 1530 ml  Output 500 ml  Net 1030 ml   Filed Weights   12/14/23 0537 12/15/23 0335 12/16/23 0440  Weight: 109.1 kg 109.2 kg 110.2 kg    Physical Exam   GEN: No acute distress.   Cardiac: AF, no rubs, or gallops. Systolic crescendo murmur Respiratory: Clear to auscultation bilaterally. GI: Soft, nontender, non-distended  MS: No edema  Labs   Telemetry: SBRAD & PAF with SVR   Chemistry Recent Labs  Lab 12/13/23 0910 12/14/23 0231 12/16/23 0315 12/17/23 0341 12/18/23 0258  NA 139   < > 139 138 140  K 3.9   < > 4.2 3.9 3.7  CL 107   < > 106 103 104  CO2 24   < > 25 28 29   GLUCOSE 62*   < > 90 204* 129*  BUN 29*   < > 25* 26* 26*  CREATININE 1.00   < > 0.95 1.07 0.97  CALCIUM 7.9*   < > 7.8* 7.7* 7.9*  PROT 5.3*  --   --   --   --   ALBUMIN 2.7*  --   --   --   --   AST 31  --   --   --   --   ALT 21  --   --   --   --   ALKPHOS 56  --   --   --   --   BILITOT 1.0  --   --   --   --   GFRNONAA >60   < > >60 >60 >60  ANIONGAP 8   < > 8 7 7    < > = values in this interval not displayed.     Hematology Recent Labs  Lab 12/16/23 1232 12/17/23 0341 12/17/23 1952 12/18/23 0258  WBC 5.5 5.1  --  4.9  RBC 2.57* 2.52*  --  2.93*  HGB 7.7* 7.5* 8.8*  8.6*  HCT 25.4* 25.0* 28.9* 28.5*  MCV 98.8 99.2  --  97.3  MCH 30.0 29.8  --  29.4  MCHC 30.3 30.0  --  30.2  RDW 26.8* 26.5*  --  26.6*  PLT 209 208  --  188    BNP Recent Labs  Lab 12/13/23 0910  BNP 540.4*      Cardiac Studies   Cardiac Studies & Procedures   ______________________________________________________________________________________________ CARDIAC CATHETERIZATION  CARDIAC CATHETERIZATION 11/21/2023  Narrative   Prox RCA lesion is 75% stenosed.   Mid RCA-1 lesion is 75% stenosed.   Mid RCA-2 lesion is 75% stenosed.   Mid RCA to Dist RCA lesion is  90% stenosed.   Dist RCA lesion is 50% stenosed with 50% stenosed side branch in RPAV.   Ost RCA to Prox RCA lesion is 50% stenosed.   Ost LM to Mid LM lesion is 70% stenosed.   Dist LM lesion is 90% stenosed.   Ost Cx lesion is 100% stenosed.   Ost LAD to Prox LAD lesion is 95% stenosed.   Prox LAD to Dist LAD lesion is 50% stenosed.   2nd Diag lesion is 75% stenosed.   There is moderate to severe left ventricular systolic dysfunction.   LV end diastolic pressure is moderately elevated.   The left ventricular ejection fraction is 25-35% by visual estimate.   Anticipated discharge date to be determined.   Recommend to resume Apixaban, at currently prescribed dose and frequency.  Conclusion Left heart cath right radial approach Non-STEMI shortness of breath  Left ventriculogram Left ventricular enlargement global hypokinesis EF around 30%  Coronaries Left main large moderate to severe calcification 70% distal LAD large 90% ostial 95% proximal diffuse 50% moderate to severe calcification proximally Circumflex 100% occluded ostially moderate to severe calcification RCA large serial 75% lesions mid heavy calcification 90% mid to distal moderate calcification right dominant system moderate disease diffusely distally  Intervention deferred Recommend evaluation for high risk coronary bypass surgery versus  high risk PCI and stent for complex disease Medical therapy is also not unreasonable Will discuss images with tertiary care facility  Findings Coronary Findings Diagnostic  Dominance: Right  Left Main Ost LM to Mid LM lesion is 70% stenosed. Vessel is not the culprit lesion. The lesion is type C, located at the major branch and discrete. The lesion is moderately calcified. Dist LM lesion is 90% stenosed. Vessel is not the culprit lesion. The lesion is type C and located at the bifurcation. The lesion is moderately calcified.  Left Anterior Descending Ost LAD to Prox LAD lesion is 95% stenosed. The lesion is moderately calcified. Prox LAD to Dist LAD lesion is 50% stenosed. Vessel is not the culprit lesion. The lesion is type C and concentric. The lesion is mildly calcified. The lesion was not previously treated . The stenosis was measured by a visual reading. Pressure gradient was not performed on the lesion. IVUS was not performed. No optical coherence tomography (OCT) was performed.  Second Diagonal Branch 2nd Diag lesion is 75% stenosed. Vessel is not the culprit lesion. The lesion is type C and discrete.  Left Circumflex Ost Cx lesion is 100% stenosed. Vessel is not the culprit lesion. The lesion is not complex (non high-C), located at the bifurcation, discrete and chronically occluded. The lesion is severely calcified.  Right Coronary Artery Ost RCA to Prox RCA lesion is 50% stenosed. The lesion is moderately calcified. Prox RCA lesion is 75% stenosed. The lesion is irregular. The lesion is severely calcified. Mid RCA-1 lesion is 75% stenosed. The lesion is severely calcified. Mid RCA-2 lesion is 75% stenosed. The lesion is severely calcified. Mid RCA to Dist RCA lesion is 90% stenosed. The lesion is moderately calcified. Dist RCA lesion is 50% stenosed with 50% stenosed side branch in RPAV. Vessel is not the culprit lesion. The lesion is eccentric. The lesion was not previously  treated . The stenosis was measured by a visual reading.  Intervention  No interventions have been documented.   STRESS TESTS  NM MYOCAR MULTI W/SPECT W 12/21/2016  Narrative CLINICAL DATA:  Chest pain  EXAM: MYOCARDIAL IMAGING WITH SPECT (REST AND PHARMACOLOGIC-STRESS)  GATED LEFT  VENTRICULAR WALL MOTION STUDY  LEFT VENTRICULAR EJECTION FRACTION  TECHNIQUE: Standard myocardial SPECT imaging was performed after resting intravenous injection of 10 mCi Tc-39m tetrofosmin. Subsequently, intravenous infusion of Lexiscan was performed under the supervision of the Cardiology staff. At peak effect of the drug, 30 mCi Tc-32m tetrofosmin was injected intravenously and standard myocardial SPECT imaging was performed. Quantitative gated imaging was also performed to evaluate left ventricular wall motion, and estimate left ventricular ejection fraction.  COMPARISON:  None.  FINDINGS: Perfusion: There is fixed thinning of the inferior wall. Attenuation artifact is present at the apex. No stress-induced ischemia.  Wall Motion: Normal left ventricular wall motion. No left ventricular dilation.  Left Ventricular Ejection Fraction: 57 %  End diastolic volume 104 ml  End systolic volume 45 ml  IMPRESSION: 1. There is fixed thinning of the inferior wall and attenuation artifact at the apex. No stress-induced ischemia.  2. Normal left ventricular wall motion.  3. Left ventricular ejection fraction 57%  4. Non invasive risk stratification*: Low risk.  *2012 Appropriate Use Criteria for Coronary Revascularization Focused Update: J Am Coll Cardiol. 2012;59(9):857-881. http://content.dementiazones.com.aspx?articleid=1201161   Electronically Signed By: Artice Last M.D. On: 12/21/2016 13:58   ECHOCARDIOGRAM  ECHOCARDIOGRAM COMPLETE 11/19/2023  Narrative ECHOCARDIOGRAM REPORT    Patient Name:   KARDER GOODIN Date of Exam: 11/19/2023 Medical Rec #:  [id number]            Height: Accession #:    [id number]          Weight: Date of Birth:  1941-01-26           BSA: Patient Age:    82 years            BP:           99/64 mmHg Patient Gender: M                   HR:           85 bpm. Exam Location:  [facility]  Procedure: 2D Echo, Cardiac Doppler and Color Doppler (Both Spectral and Color Flow Doppler were utilized during procedure).  Indications:     Acutre respiratory distress R06.03  History:         Patient has prior history of Echocardiogram examinations. Stroke; Risk Factors:Hypertension.  Sonographer:     Kathaleen Pale Roar Referring Phys:  Maudine Sos MD Diagnosing Phys: Maudine Sos MD  IMPRESSIONS   1. Left ventricular ejection fraction, by estimation, is 25 to 30%. The left ventricle has severely decreased function. The left ventricle demonstrates global hypokinesis. There is mild concentric left ventricular hypertrophy. Left ventricular diastolic parameters are consistent with Grade I diastolic dysfunction (impaired relaxation). Elevated left ventricular end-diastolic pressure. 2. Right ventricular systolic function is normal. The right ventricular size is normal. There is normal pulmonary artery systolic pressure. 3. Left atrial size was moderately dilated. 4. The mitral valve is normal in structure. Mild mitral valve regurgitation. No evidence of mitral stenosis. 5. Dimensionless index in 0.23, consistent with severe aortic stenosis. The severity of the aortic stenosis is likely underestimated by mean gradient due to low flow caused by systolic dysfunction.. The aortic valve is calcified. There is severe calcifcation of the aortic valve. There is severe thickening of the aortic valve. Aortic valve regurgitation is mild to moderate. Severe aortic valve stenosis. Aortic valve mean gradient measures 21.0 mmHg. Aortic valve Vmax measures 2.95 m/s. 6. The inferior vena cava is dilated in size with <50% respiratory variability,  suggesting right  atrial pressure of 15 mmHg.  FINDINGS Left Ventricle: Left ventricular ejection fraction, by estimation, is 25 to 30%. The left ventricle has severely decreased function. The left ventricle demonstrates global hypokinesis. The left ventricular internal cavity size was normal in size. There is mild concentric left ventricular hypertrophy. Left ventricular diastolic parameters are consistent with Grade I diastolic dysfunction (impaired relaxation). Elevated left ventricular end-diastolic pressure.  Right Ventricle: The right ventricular size is normal. No increase in right ventricular wall thickness. Right ventricular systolic function is normal. There is normal pulmonary artery systolic pressure. The tricuspid regurgitant velocity is 2.09 m/s, and with an assumed right atrial pressure of 15 mmHg, the estimated right ventricular systolic pressure is 32.5 mmHg.  Left Atrium: Left atrial size was moderately dilated.  Right Atrium: Right atrial size was normal in size.  Pericardium: There is no evidence of pericardial effusion.  Mitral Valve: The mitral valve is normal in structure. Mild mitral valve regurgitation. No evidence of mitral valve stenosis.  Tricuspid Valve: The tricuspid valve is normal in structure. Tricuspid valve regurgitation is trivial. No evidence of tricuspid stenosis.  Aortic Valve: Dimensionless index in 0.23, consistent with severe aortic stenosis. The severity of the aortic stenosis is likely underestimated by mean gradient due to low flow caused by systolic dysfunction. The aortic valve is calcified. There is severe calcifcation of the aortic valve. There is severe thickening of the aortic valve. Aortic valve regurgitation is mild to moderate. Severe aortic stenosis is present. Aortic valve mean gradient measures 21.0 mmHg. Aortic valve peak gradient measures 34.8 mmHg.  Pulmonic Valve: The pulmonic valve was normal in structure. Pulmonic valve regurgitation is not  visualized. No evidence of pulmonic stenosis.  Aorta: The aortic root is normal in size and structure.  Venous: The inferior vena cava is dilated in size with less than 50% respiratory variability, suggesting right atrial pressure of 15 mmHg.  IAS/Shunts: No atrial level shunt detected by color flow Doppler.   LEFT VENTRICLE PLAX 2D LVIDd:         5.90 cm Diastology LVIDs:         5.00 cm LV e' medial:    3.90 cm/s LV PW:         1.10 cm LV E/e' medial:  34.1 LV IVS:        1.30 cm LV e' lateral:   4.65 cm/s LV E/e' lateral: 28.6   RIGHT VENTRICLE RV Basal diam:  3.60 cm RV Mid diam:    3.40 cm RV Length:      6.60 cm RV S prime:     1340.00 cm/s TAPSE (M-mode): 1.7 cm  LEFT ATRIUM LA diam:      4.00 cm LA Vol (A2C): 27.9 ml LA Vol (A4C): 80.3 ml AORTIC VALVE                    PULMONIC VALVE AV Vmax:           295.00 cm/s  PV Vmax:       83.40 m/s AV Vmean:          173.000 cm/s PV Peak grad:  27822.2 mmHg AV VTI:            0.576 m AV Peak Grad:      34.8 mmHg AV Mean Grad:      21.0 mmHg LVOT Vmax:         67.90 cm/s LVOT Vmean:  43.200 cm/s LVOT VTI:          0.134 m LVOT/AV VTI ratio: 0.23  AORTA Ao STJ diam: 2.9 cm Ao Asc diam: 3.90 cm  MV E velocity: 133.00 cm/s  TRICUSPID VALVE TV Peak grad:   17.5 mmHg TV Vmax:        2.09 m/s TR Peak grad:   17.5 mmHg TR Vmax:        209.00 cm/s  SHUNTS Systemic VTI: 0.13 m  Maudine Sos MD Electronically signed by Maudine Sos MD Signature Date/Time: 11/21/2023/5:53:04 AM    Final   TEE  ECHO TEE 09/15/2020  Narrative TRANSESOPHOGEAL ECHO REPORT    Patient Name:   Elenore Griffon Date of Exam: 09/15/2020 Medical Rec #:  [id number]     Height:       73.0 in Accession #:    [id number]    Weight:       231.0 lb Date of Birth:  July 10, 1941     BSA:          2.288 m Patient Age:    79 years      BP:           129/66 mmHg Patient Gender: M             HR:           55 bpm. Exam Location:   [facility]  Procedure: Transesophageal Echo, Color Doppler, Cardiac Doppler and Saline Contrast Bubble Study  Indications:     Bacteremia 790.7/ R78.81  History:         Patient has prior history of Echocardiogram examinations, most recent 09/14/2020. Stroke; Risk Factors:Hypertension and Diabetes.  Sonographer:     Broadus Canes RDCS (AE) Referring Phys:  [id number] Ronney Cola Diagnosing Phys: Starlette Ebbs MD  PROCEDURE: After discussion of the risks and benefits of a TEE, an informed consent was obtained from the patient. TEE procedure time was 30 minutes. The transesophogeal probe was passed without difficulty through the esophogus of the patient. Imaged were obtained with the patient in a left lateral decubitus position. Sedation performed by different physician. Image quality was excellent. The patient's vital signs; including heart rate, blood pressure, and oxygen saturation; remained stable throughout the procedure. The patient developed no complications during the procedure.  IMPRESSIONS   1. Left ventricular ejection fraction, by estimation, is 55 to 60%. The left ventricle has normal function. The left ventricle has no regional wall motion abnormalities. Left ventricular diastolic parameters were normal. 2. Right ventricular systolic function is normal. The right ventricular size is normal. 3. No left atrial/left atrial appendage thrombus was detected. 4. The mitral valve is normal in structure. Trivial mitral valve regurgitation. 5. The aortic valve is calcified. Aortic valve regurgitation is trivial.  Conclusion(s)/Recommendation(s): No evidence of vegetation/infective endocarditis on this transesophageal echocardiogram.  FINDINGS Left Ventricle: Left ventricular ejection fraction, by estimation, is 55 to 60%. The left ventricle has normal function. The left ventricle has no regional wall motion abnormalities. The left ventricular internal cavity size was normal in size.  Left ventricular diastolic parameters were normal.  Right Ventricle: The right ventricular size is normal. No increase in right ventricular wall thickness. Right ventricular systolic function is normal.  Left Atrium: Left atrial size was normal in size. No left atrial/left atrial appendage thrombus was detected.  Right Atrium: Right atrial size was normal in size.  Pericardium: There is no evidence of pericardial effusion.  Mitral Valve: The mitral valve is normal in  structure. Trivial mitral valve regurgitation.  Tricuspid Valve: The tricuspid valve is grossly normal. Tricuspid valve regurgitation is trivial.  Aortic Valve: The aortic valve is calcified. Aortic valve regurgitation is trivial. There is no evidence of aortic valve vegetation.  Pulmonic Valve: The pulmonic valve was not well visualized. Pulmonic valve regurgitation is not visualized.  Aorta: The aortic root is normal in size and structure.  IAS/Shunts: No atrial level shunt detected by color flow Doppler. Agitated saline contrast was given intravenously to evaluate for intracardiac shunting.  Starlette Ebbs MD Electronically signed by Starlette Ebbs MD Signature Date/Time: 09/15/2020/1:28:53 PM    Final        ______________________________________________________________________________________________        Assessment & Plan    Goals of Care He has a DNR order with limited nature. Discussion about considering more conservative goals of care given his multiple comorbidities and current health status.  - this AM he shared with me the tribulations of his complex care (PCI, Severe AS, bleeding, hypotension requiring midodrine at high doses, multiple falls).  Family has been talking - he would like to switch to a more comfort focuses plan of care - I think this is very reasonable, we started discussing what this might look like (no EGD, potentially SAPT vs no therapy) - Reaching out to Drs. 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 Multidisciplinary team involved in decision-making process. - See 12/16/23 notes for SDM meeting - Continue DAPT until December 29, 2023 - Transition to monotherapy with Plavix post-December 29, 2023, if hemoglobin stable unless changes related to our GOC conservation  Severe Aortic Stenosis - no plans fot TAVR as this time  HFrEF - acute on chronic, hypervolemic - may give additional IV lasix until clear GOC transition   Symptomatic Hypotension - Symptomatic hypotension with near syncope and multiple falls. Currently on midodrine to manage hypotension. Unable to tolerate goal-directed medical therapy due to hypotension.      For questions or updates, please contact [facility] Please consult [email or web address] for contact info under Cardiology/STEMI.      Gloriann Larger, MD FASE [facility] Cardiologist [facility]  [address], [address] [place], [place] [address] [phone]  8:24 AM

## 2023-12-18 NOTE — Progress Notes (Signed)
 PROGRESS NOTE    Mathew Baker  ZOX:[id number] DOB: 09-Sep-1940 DOA: 12/13/2023 PCP: Jimmy Moulding, MD   82/M w hypertension, chronic systolic CHF, severe aortic stenosis, paroxysmal atrial fibrillation on chronic anticoagulation, COPD, T2DM, CVA, and hepatocellular carcinoma transferred from [facility] to [facility] on 3/18 for multivessel disease, underwent LHC 3/19 with PCI to X, DES to left main into circumflex, balloon pump, followed by staged PCI X2 to RCA on 3/24, discharged home on Plavix, statin, midodrine, also diagnosed with severe aortic stenosis, systolic CHF with EF 25-30% plan for TAVR eval, in addition had urinary retention, discharged with the Foley.  Presented to [facility] 3/31 with urinary retention and hematuria, hypotensive improved after fluid bolus, discharged on 4/3 after transfusion, Foley catheter removal etc. -Presented to [facility] ED 4/8 with worsening shortness of breath orthopnea, melena for 2 weeks, in the ED he was noted to be volume overloaded, EKG with known LBBB, A-fib, chest x-ray with small to moderate right effusion, BNP 540, troponin 520, 497, hemoglobin 7, Hemoccult positive -Admitted, started on diuretics, transfused PRBC and given IV iron -Cards and GI following, plan for endoscopy on Monday  Subjective: Feels well, family at bedside, denies dyspnea  Assessment and Plan:  Acute on chronic systolic CHF Severe aortic stenosis -Last echocardiogram LVEF to be 25- 30% with grade 1 diastolic dysfunction, moderate dilated left atrium, severe aortic stenosis  -Improving with gentle diuresis, continue IV Lasix 1 more day -GDMT limited by hypotension, on midodrine> dose increased -Conservative management -Follow-up with [facility] cardiology for TAVR eval -Increase activity   Elevated troponin CAD status post PCI -recent admission at [facility] with multivessel disease where he had successful staged PCI to LAD and RCA.  High-sensitivity troponins 520-> 497 -do not suspect ACS at  this time -Continue aspirin, Plavix, statin   Acute on chronic anemia Melena -Recent admission transfused 2U PRBC, then transfused 1 unit PRBC 4/8 and 1 more unit PRBC yesterday 4/12 -Ongoing intermittent melena -Hemoglobin improving -Eliquis on hold, given IV iron as well   Paroxysmal atrial fibrillation Patient appears relatively rate controlled at this time.  CHA2DS2-VASc score of 7.  -Holding Eliquis in the setting of above   Uncontrolled Diabetes Mellitus type 2 with hypoglycemia,  Last hemoglobin A1c noted to be 9.1.  On high-dose Lantus at baseline -CBGs are stable on much lower dose  Orthostatic hypotension Syncopal episodes Recurrent falls -Multifactorial in the setting of severe aortic stenosis, hypotension -Continue midodrine   BPH Patient reports voiding without any issue at this time. -Continue Flomax   History of tubular adenomas Last colonoscopy performed back in 2015 noted 2 tubular adenomas which were removed.   GERD -Protonix twice daily     DVT prophylaxis: SCDs Advance Care Planning: DNR Family Communication: Daughter at bedside Disposition: SNF pending above workup  Consultants:    Procedures:   Antimicrobials:    Objective: Vitals:   12/18/23 0008 12/18/23 0009 12/18/23 0907 12/18/23 1059  BP: 109/64  (!) 101/57 128/76  Pulse:   80 88  Resp: 18  18 18   Temp: 98 F (36.7 C) 98 F (36.7 C) (!) 97.5 F (36.4 C) 97.9 F (36.6 C)  TempSrc: Oral Oral Oral Oral  SpO2:   95% 98%  Weight:      Height:        Intake/Output Summary (Last 24 hours) at 12/18/2023 1114 Last data filed at 12/18/2023 0902 Gross per 24 hour  Intake 1173 ml  Output --  Net 1173 ml  Filed Weights   12/14/23 0537 12/15/23 0335 12/16/23 0440  Weight: 109.1 kg 109.2 kg 110.2 kg    Examination:  General exam: Chronically ill male sitting up in bed, AO x 3, no distress HEENT: Positive JVD CVS: S1-S2, irregular rhythm, systolic murmur Lungs: Diminished  breath sounds at the bases otherwise clear Abdomen: Soft, nontender, bowel sounds present Extremities: No edema Skin: No rashes Psychiatry:  Mood & affect appropriate.     Data Reviewed:   CBC: Recent Labs  Lab 12/13/23 0910 12/14/23 1012 12/15/23 0330 12/16/23 0315 12/16/23 1232 12/17/23 0341 12/17/23 1952 12/18/23 0258  WBC 6.0   < > 5.5 6.5 5.5 5.1  --  4.9  NEUTROABS 5.2  --   --   --   --   --   --   --   HGB 7.0*   < > 7.7* 7.6* 7.7* 7.5* 8.8* 8.6*  HCT 23.1*   < > 24.6* 24.8* 25.4* 25.0* 28.9* 28.5*  MCV 106.9*   < > 98.8 98.8 98.8 99.2  --  97.3  PLT 220   < > 190 197 209 208  --  188   < > = values in this interval not displayed.   Basic Metabolic Panel: Recent Labs  Lab 12/14/23 0231 12/15/23 0330 12/16/23 0315 12/17/23 0341 12/18/23 0258  NA 139 139 139 138 140  K 3.8 3.7 4.2 3.9 3.7  CL 107 106 106 103 104  CO2 27 27 25 28 29   GLUCOSE 139* 107* 90 204* 129*  BUN 30* 29* 25* 26* 26*  CREATININE 1.03 0.92 0.95 1.07 0.97  CALCIUM 7.8* 8.0* 7.8* 7.7* 7.9*   GFR: Estimated Creatinine Clearance: 75.1 mL/min (by C-G formula based on SCr of 0.97 mg/dL). Liver Function Tests: Recent Labs  Lab 12/13/23 0910  AST 31  ALT 21  ALKPHOS 56  BILITOT 1.0  PROT 5.3*  ALBUMIN 2.7*   No results for input(s): "LIPASE", "AMYLASE" in the last 168 hours. No results for input(s): "AMMONIA" in the last 168 hours. Coagulation Profile: No results for input(s): "INR", "PROTIME" in the last 168 hours. Cardiac Enzymes: No results for input(s): "CKTOTAL", "CKMB", "CKMBINDEX", "TROPONINI" in the last 168 hours. BNP (last 3 results) No results for input(s): "PROBNP" in the last 8760 hours. HbA1C: No results for input(s): "HGBA1C" in the last 72 hours. CBG: Recent Labs  Lab 12/17/23 0608 12/17/23 1136 12/17/23 1605 12/17/23 2127 12/18/23 0624  GLUCAP 158* 258* 236* 118* 113*   Lipid Profile: No results for input(s): "CHOL", "HDL", "LDLCALC", "TRIG", "CHOLHDL",  "LDLDIRECT" in the last 72 hours. Thyroid Function Tests: No results for input(s): "TSH", "T4TOTAL", "FREET4", "T3FREE", "THYROIDAB" in the last 72 hours. Anemia Panel: No results for input(s): "VITAMINB12", "FOLATE", "FERRITIN", "TIBC", "IRON", "RETICCTPCT" in the last 72 hours.  Urine analysis:    Component Value Date/Time   COLORURINE RED (A) 12/06/2023 0428   APPEARANCEUR CLOUDY (A) 12/06/2023 0428   LABSPEC  12/06/2023 0428    TEST NOT REPORTED DUE TO COLOR INTERFERENCE OF URINE PIGMENT   PHURINE  12/06/2023 0428    TEST NOT REPORTED DUE TO COLOR INTERFERENCE OF URINE PIGMENT   GLUCOSEU (A) 12/06/2023 0428    TEST NOT REPORTED DUE TO COLOR INTERFERENCE OF URINE PIGMENT   HGBUR (A) 12/06/2023 0428    TEST NOT REPORTED DUE TO COLOR INTERFERENCE OF URINE PIGMENT   BILIRUBINUR (A) 12/06/2023 0428    TEST NOT REPORTED DUE TO COLOR INTERFERENCE OF URINE PIGMENT  KETONESUR (A) 12/06/2023 0428    TEST NOT REPORTED DUE TO COLOR INTERFERENCE OF URINE PIGMENT   PROTEINUR (A) 12/06/2023 0428    TEST NOT REPORTED DUE TO COLOR INTERFERENCE OF URINE PIGMENT   NITRITE (A) 12/06/2023 0428    TEST NOT REPORTED DUE TO COLOR INTERFERENCE OF URINE PIGMENT   LEUKOCYTESUR (A) 12/06/2023 0428    TEST NOT REPORTED DUE TO COLOR INTERFERENCE OF URINE PIGMENT   Sepsis Labs: @LABRCNTIP (procalcitonin:4,lacticidven:4)  ) No results found for this or any previous visit (from the past 240 hours).    Radiology Studies: No results found.    Scheduled Meds:  aspirin EC  81 mg Oral Daily   atorvastatin  80 mg Oral Daily   clopidogrel  75 mg Oral Daily   dextrose  1 ampule Intravenous Once   furosemide  60 mg Intravenous Daily   insulin aspart  0-15 Units Subcutaneous TID WC   insulin glargine-yfgn  6 Units Subcutaneous Daily   midodrine  10 mg Oral BID   pantoprazole  40 mg Oral BID   sodium chloride flush  3 mL Intravenous Q12H   tamsulosin  0.4 mg Oral QHS   Continuous Infusions:      LOS: 5 days    Time spent:    Deforest Fast, MD [facility]   12/18/2023, 11:14 AM

## 2023-12-18 NOTE — Plan of Care (Signed)

## 2023-12-18 NOTE — Progress Notes (Signed)
 I received Messages from cardiologist and [facility] physician seeing this patient today indicating the patient and family are considering limiting care and interventions as well as an evaluation by palliative care.  As such, no upper endoscopy planned for tomorrow.   Dr. Rosaline Coma will assume the consult service tomorrow and will be available to hear from the medical service about how we should proceed after patient and family consider their options.  Memory Staggers MD

## 2023-12-18 NOTE — Plan of Care (Signed)
  Problem: Coping: Goal: Ability to adjust to condition or change in health will improve Outcome: Progressing   Problem: Fluid Volume: Goal: Ability to maintain a balanced intake and output will improve Outcome: Progressing   Problem: Health Behavior/Discharge Planning: Goal: Ability to manage health-related needs will improve Outcome: Progressing   Problem: Metabolic: Goal: Ability to maintain appropriate glucose levels will improve Outcome: Progressing

## 2023-12-19 DIAGNOSIS — I5020 Unspecified systolic (congestive) heart failure: Secondary | ICD-10-CM | POA: Diagnosis not present

## 2023-12-19 DIAGNOSIS — D649 Anemia, unspecified: Secondary | ICD-10-CM | POA: Diagnosis not present

## 2023-12-19 DIAGNOSIS — K921 Melena: Secondary | ICD-10-CM | POA: Diagnosis not present

## 2023-12-19 DIAGNOSIS — Z789 Other specified health status: Secondary | ICD-10-CM

## 2023-12-19 DIAGNOSIS — Z515 Encounter for palliative care: Secondary | ICD-10-CM | POA: Diagnosis not present

## 2023-12-19 DIAGNOSIS — Z7189 Other specified counseling: Secondary | ICD-10-CM

## 2023-12-19 DIAGNOSIS — Z66 Do not resuscitate: Secondary | ICD-10-CM

## 2023-12-19 LAB — CBC
HCT: 29.2 % — ABNORMAL LOW (ref 39.0–52.0)
Hemoglobin: 9 g/dL — ABNORMAL LOW (ref 13.0–17.0)
MCH: 30.2 pg (ref 26.0–34.0)
MCHC: 30.8 g/dL (ref 30.0–36.0)
MCV: 98 fL (ref 80.0–100.0)
Platelets: 201 10*3/uL (ref 150–400)
RBC: 2.98 MIL/uL — ABNORMAL LOW (ref 4.22–5.81)
RDW: 26.6 % — ABNORMAL HIGH (ref 11.5–15.5)
WBC: 4.9 10*3/uL (ref 4.0–10.5)
nRBC: 0 % (ref 0.0–0.2)

## 2023-12-19 LAB — BASIC METABOLIC PANEL WITH GFR
Anion gap: 8 (ref 5–15)
BUN: 25 mg/dL — ABNORMAL HIGH (ref 8–23)
CO2: 29 mmol/L (ref 22–32)
Calcium: 7.9 mg/dL — ABNORMAL LOW (ref 8.9–10.3)
Chloride: 103 mmol/L (ref 98–111)
Creatinine, Ser: 1.12 mg/dL (ref 0.61–1.24)
GFR, Estimated: >60 mL/min (ref >60)
Glucose, Bld: 213 mg/dL — ABNORMAL HIGH (ref 70–99)
Potassium: 3.8 mmol/L (ref 3.5–5.1)
Sodium: 140 mmol/L (ref 135–145)

## 2023-12-19 LAB — GLUCOSE, CAPILLARY
Glucose-Capillary: 175 mg/dL — ABNORMAL HIGH (ref 70–99)
Glucose-Capillary: 269 mg/dL — ABNORMAL HIGH (ref 70–99)
Glucose-Capillary: 176 mg/dL — ABNORMAL HIGH (ref 70–99)
Glucose-Capillary: 214 mg/dL — ABNORMAL HIGH (ref 70–99)

## 2023-12-19 MED ORDER — SODIUM CHLORIDE 0.9 % IV SOLN: INTRAVENOUS | Status: DC

## 2023-12-19 NOTE — Consult Note (Signed)
 Palliative Care Consult Note                                  Date: 12/19/2023   Patient Name: Mathew Baker  DOB: 1940/10/16  MRN: [id number]  Age / Sex: 83 y.o., male  PCP: Jimmy Moulding, MD Referring Physician: Donley Furth*  Reason for Consultation: Establishing goals of care  HPI/Patient Profile: 83 y.o. male  with past medical history of hypertension, chronic systolic CHF, severe aortic stenosis, paroxysmal atrial fibrillation on chronic anticoagulation, COPD, T2DM, CVA, and hepatocellular carcinoma. Noted recent admit to [facility] for multi-vessel disease treated with LHC, PCI, IABP. He presented with worsening shortness of breath and orthopnea, in the ED he was noted to be volume overloaded. He was admitted on 12/13/2023 with acute on chronic HFrEF (25 to 30%), severe aortic stenosis, acute on chronic anemia with melena, and others.   Palliative medicine consulted for GOC conversations due to patient/family expressing desire to discuss more comfort focused approach.  Past Medical History:  Diagnosis Date   Chronic airway obstruction (HCC)    Degeneration of intervertebral disc of lumbar region    Diabetes mellitus without complication (HCC)    Essential hypertension, benign    Hyperlipidemia    Hypertension    Osteoarthritis of lower extremity    Pneumonia, organism unspecified(486)    PSA elevation 07/16/2013   Normal   Stroke (HCC)     Subjective:   This NP Lizbeth Right reviewed medical records, received report from team, assessed the patient and then meet at the patient's bedside to discuss diagnosis, prognosis, GOC, EOL wishes disposition and options.  I met with the patient at the bedside.  Also present with his daughter [name] and, later in the conversation, several grandchildren.   We meet to discuss diagnosis prognosis, GOC, EOL wishes, disposition and options. Concept of Palliative Care was  introduced as specialized medical care for people and their families living with serious illness.  If focuses on providing relief from the symptoms and stress of a serious illness.  The goal is to improve quality of life for both the patient and the family. Values and goals of care important to patient and family were attempted to be elicited.  Created space and opportunity for patient  and family to explore thoughts and feelings regarding current medical situation   Natural trajectory and current clinical status were discussed. Questions and concerns addressed. Patient  encouraged to call with questions or concerns.    Patient/Family Understanding of Illness: They understand he needs to make a decision on treatment regarding blood in his stool.  They also understand he has a weak heart and bad heart valve and did not do well with PCI at [facility].  [facility] is offered for him to come back for TAVR eval, but his primary cardiologist made it quite clear that he was likely not a candidate.  We spent substantial time further discussing and exploring his acute and chronic clinical situations.  Life Review: The patient has been married for 58 years and has 1 daughter and 4 grandchildren (3 girls and 1 boy).  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  He is originally  from [place], [place] .  Patient Values: Family  Goals: To improve quality of life and quantity of life as much as possible, more time with family.  Today's Discussion: In addition to discussions described above we had extensive discussion on various topics.  We spent time discussing his baseline status.  Before his heart troubles at [facility] he went to flea markets on the weekends, went to gun shows, and enjoyed traveling.  In essence he  had a full and functional, enjoyable life.  In the past month they state that "everything is changed".  He is fallen 4 times and cannot breathe.  This is not a significant impact on his quality of life.  We talked about his acute situation with melena for which he may be able to pursue EGD for diagnostic and/or possibly treatment options.  We spent a good amount of time discussing EGDs, risk with sedation.  One of his grandchildren at one point asked if he could have it without sedation which I advised him no.  We discussed the option that if he did not undergo EGD that there is a good possibility he would continue to bleed and required repeat hospitalizations and/or repeat transfusions.  After discussing all this, they do not feel that that is a very realistic situation that he would be in favor of.  He feels strongly that he wants to try to correct the problem, if possible.  He and his daughter described waffling on this decision recently.  However, they now feel that the risk is worth the potential benefit.  I shared that I would reach out and discuss this with the medical team.  We did discuss the difficulty he had with his heart attack at [facility].  He required multiple stenting that had to be done in 2 stages because he did not handle it very well.  The impression I got from [facility] about TAVR is that it would be "iffy" but they are offering outpatient evaluation.  His daughter shares that he was "done with all this" but that he thought about possible losses including the loss of seeing his grandchildren grow up.  He is clear that his primary cardiologist feels he is not a candidate for TAVR, even at [facility].  Finally we talked about possibility of SNF/rehab in order to help him improve his strength.  After discussing this with patient and family, they are in agreement to attempt SNF/rehab placement to improve his strength and also support improved quality of life compared to the past month.  At the end of our  conversation we agreed that they are open to EGD with the hope for possible improvement.  Overall goal seems to be to maximize quality of life, even though they understand that it is unlikely he will get TAVR and he will continue to struggle with his serious illness.  We discussed benefit for outpatient palliative care to follow along and have ongoing and engaging discussions as he walks his illness journey and has other situations arise.  They are agreeable to outpatient palliative care at discharge.  I shared that I would come back and see him in a couple days to allow time for possible EGD tomorrow.  I provided contact information to his family for any questions or concerns. I provided emotional and general support through therapeutic listening, empathy, sharing of stories, therapeutic touch, and other techniques. I answered all questions and addressed all concerns to the best of my ability.  After our discussion I debriefed with the medical  and nursing teams including GI and hospitalist.  Review of Systems  Respiratory:  Negative for shortness of breath (At rest).   Cardiovascular:  Negative for chest pain (At rest).  Gastrointestinal:  Negative for abdominal pain, nausea and vomiting.    Objective:   Primary Diagnoses: Present on Admission:  HFrEF (heart failure with reduced ejection fraction) (HCC)  Elevated troponin  Symptomatic anemia  GI bleed  Paroxysmal atrial fibrillation (HCC)  Orthostatic hypotension  BPH (benign prostatic hyperplasia)  GERD (gastroesophageal reflux disease)  Acute on chronic systolic (congestive) heart failure (HCC)   Physical Exam Vitals and nursing note reviewed.  Constitutional:      General: He is not in acute distress.    Appearance: He is ill-appearing. He is not toxic-appearing.  HENT:     Head: Normocephalic and atraumatic.  Cardiovascular:     Rate and Rhythm: Normal rate.  Pulmonary:     Effort: Pulmonary effort is normal. No respiratory  distress.     Breath sounds: No wheezing or rhonchi.  Abdominal:     General: Abdomen is flat. Bowel sounds are normal. There is no distension.     Palpations: Abdomen is soft.     Tenderness: There is no abdominal tenderness.  Skin:    General: Skin is warm and dry.  Neurological:     General: No focal deficit present.     Mental Status: He is alert and oriented to person, place, and time.  Psychiatric:        Mood and Affect: Mood normal.        Behavior: Behavior normal.     Vital Signs:  BP (!) 101/57 (BP Location: Right Arm)   Pulse 95   Temp 98.3 F (36.8 C) (Oral)   Resp 19   Ht 6\' 1"  (1.854 m)   Wt 108.9 kg   SpO2 98%   BMI 31.66 kg/m   Palliative Assessment/Data: 60%    Advanced Care Planning:   Existing Vynca/ACP Documentation: Advance directive signed 02/18/2019 Goals of care document signed 03/20/2019 New goals of care document completed/14/2025  Primary Decision Maker: PATIENT  Code Status/Advance Care Planning: DNR-limited  A discussion was had today regarding advanced directives. Concepts specific to code status, artifical feeding and hydration, continued IV antibiotics and rehospitalization was had.  The difference between a aggressive medical intervention path and a palliative comfort care path for this patient at this time was had.   Decisions/Changes to ACP: None today  Assessment & Plan:   Impression: 83 year old male with acute presentation of chronic comorbidities as described above.  Recent decline in health since MI at [facility].  Now here with acute GI bleed and fluid overload related to heart failure and severe aortic stenosis.  He understands unlikely candidate for TAVR, but willing to undergo outpatient evaluation at [facility].  Agreeable to EGD to try to find/soft source of GI bleed.  Agreeable to SNF/rehab to help improve strength and quality of life.  Agreeable to outpatient palliative care for ongoing goals of care discussion and support.   Overall prognosis guarded  SUMMARY OF RECOMMENDATIONS   DNR-limited (DNR/DNI) Full scope of care Open to EGD for evaluation/treatment Open to all offered/available medical treatments to prolong life otherwise TOC consult for outpatient palliative care Palliative medicine will follow-up in 2 days Please notify us  of any significant clinical change or new palliative needs in the interim  Symptom Management:  Per primary team PMT is available to assist as needed  Prognosis:  Unable  to determine  Discharge Planning:  Skilled Nursing Facility for rehab with Palliative care service follow-up   Discussed with: Patient, family, medical team, nursing team    Thank you for allowing us  to participate in the care of Clemon Twan Harkin PMT will continue to support holistically.  Time Total: 122 min  Detailed review of medical records (labs, imaging, vital signs), medically appropriate exam, discussed with treatment team, counseling and education to patient, family, & staff, documenting clinical information, medication management, coordination of care  Signed by: Lizbeth Right, NP Palliative Medicine Team  Team Phone # [phone] (Nights/Weekends)  12/19/2023, 11:13 AM

## 2023-12-19 NOTE — Inpatient Diabetes Management (Signed)
 Inpatient Diabetes Program Recommendations  AACE/ADA: New Consensus Statement on Inpatient Glycemic Control (2015)  Target Ranges:  Prepandial:   less than 140 mg/dL      Peak postprandial:   less than 180 mg/dL (1-2 hours)      Critically ill patients:  140 - 180 mg/dL   Lab Results  Component Value Date   GLUCAP 269 (H) 12/19/2023   HGBA1C 9.1 (H) 11/18/2023    Review of Glycemic Control  Latest Reference Range & Units 12/18/23 11:09 12/18/23 16:10 12/18/23 21:37 12/19/23 06:01 12/19/23 11:34  Glucose-Capillary 70 - 99 mg/dL 284 (H) 132 (H) 440 (H) 175 (H) 269 (H)  (H): Data is abnormally high Diabetes history: Type 2 DM Outpatient Diabetes medications: Lantus 50 units BID, Novolog 0-6 units TID Current orders for Inpatient glycemic control: Semglee 6 units every day, Novolog 0-15 units TID  Inpatient Diabetes Program Recommendations:    If aligns with goals, consider adding Novolog 3 units TID (Assuming patient is consuming >50% of meals)  Thanks, Marjo Sievert, MSN, RNC-OB Diabetes Coordinator [phone] (8a-5p)

## 2023-12-19 NOTE — Progress Notes (Signed)
   12/19/23 1735  Mobility  Activity Ambulated with assistance in hallway  Level of Assistance Contact guard assist, steadying assist  Assistive Device Front wheel walker  Distance Ambulated (ft) 50 ft  Activity Response Tolerated fair  Mobility Referral Yes  Mobility visit 1 Mobility  Mobility Specialist Start Time (ACUTE ONLY) 1735  Mobility Specialist Stop Time (ACUTE ONLY) 1746  Mobility Specialist Time Calculation (min) (ACUTE ONLY) 11 min   Mobility Specialist: Progress Note  Pre-Mobility:      HR 86, BP 101/54 (71) Post-Mobility:    HR 78, BP 93/66 (77)  Pt agreeable to mobility session - received in chair. C/o dizziness. Returned to chair with all needs met - call bell within reach. Son in law present. RN present and aware of BP.   Mathew Baker, BS Mobility Specialist Please contact via SecureChat or  Rehab office at [phone].

## 2023-12-19 NOTE — Progress Notes (Signed)
 PROGRESS NOTE    Mathew Baker  AVW:[id number]  DOB: 05-19-1941  DOA: 12/13/2023 PCP: Lauro Regulus, MD Outpatient Specialists:   Hospital course:  2/M w hypertension, chronic systolic CHF, severe AAS, paroxysmal A-fib on chronic anticoagulation, COPD, T2DM, CVA, recent staged PCI x 2 at [facility] where he was diagnosed with HFrEF EF 25 to 30% was admitted with worsening shortness of breath, orthopnea and melena.  Patient was treated with diuresis and PRBC transfusion with IV iron.    Subjective:  Patient states "I did not understand what they were telling me, I do want to have the endoscopy because I do want to know whether I am bleeding."  Patient's daughter and granddaughters were at bedside.  They note that he had initially thought he would want pursue conservative management but now would like more aggressive care.  They are eager to speak with palliative care to help them sort out goals for care.   Objective: Vitals:   12/19/23 0815 12/19/23 1134 12/19/23 1229 12/19/23 1523  BP:  132/64 (!) 123/56 100/64  Pulse:  78  80  Resp: 19 20  18   Temp: 98.3 F (36.8 C) 98.4 F (36.9 C)  98.1 F (36.7 C)  TempSrc: Oral Oral  Oral  SpO2:  97%  96%  Weight:      Height:        Intake/Output Summary (Last 24 hours) at 12/19/2023 1702 Last data filed at 12/19/2023 1232 Gross per 24 hour  Intake 1263 ml  Output 1025 ml  Net 238 ml   Filed Weights   12/15/23 0335 12/16/23 0440 12/19/23 0532  Weight: 109.2 kg 110.2 kg 108.9 kg     Exam:  General: Weak, ill-appearing gentleman lying in bed with attentive daughter and granddaughters at bedside Eyes: sclera anicteric, conjuctiva mild injection bilaterally CVS: S1-S2, regular  Respiratory:  decreased air entry bilaterally secondary to decreased inspiratory effort, rales at bases  GI: NABS, soft, NT  LE: Decreased muscle mass Neuro: A/O x 3,  grossly nonfocal.  Psych: patient is logical and coherent, judgement and  insight appear normal, mood and affect appropriate to situation.  Data Reviewed:  Basic Metabolic Panel: Recent Labs  Lab 12/15/23 0330 12/16/23 0315 12/17/23 0341 12/18/23 0258 12/19/23 0311  NA 139 139 138 140 140  K 3.7 4.2 3.9 3.7 3.8  CL 106 106 103 104 103  CO2 27 25 28 29 29   GLUCOSE 107* 90 204* 129* 213*  BUN 29* 25* 26* 26* 25*  CREATININE 0.92 0.95 1.07 0.97 1.12  CALCIUM 8.0* 7.8* 7.7* 7.9* 7.9*    CBC: Recent Labs  Lab 12/13/23 0910 12/14/23 1012 12/16/23 0315 12/16/23 1232 12/17/23 0341 12/17/23 1952 12/18/23 0258 12/19/23 0311  WBC 6.0   < > 6.5 5.5 5.1  --  4.9 4.9  NEUTROABS 5.2  --   --   --   --   --   --   --   HGB 7.0*   < > 7.6* 7.7* 7.5* 8.8* 8.6* 9.0*  HCT 23.1*   < > 24.8* 25.4* 25.0* 28.9* 28.5* 29.2*  MCV 106.9*   < > 98.8 98.8 99.2  --  97.3 98.0  PLT 220   < > 197 209 208  --  188 201   < > = values in this interval not displayed.     Scheduled Meds:  aspirin EC  81 mg Oral Daily   atorvastatin  80 mg Oral Daily  clopidogrel  75 mg Oral Daily   dextrose  1 ampule Intravenous Once   furosemide  60 mg Intravenous Daily   insulin aspart  0-15 Units Subcutaneous TID WC   insulin glargine-yfgn  6 Units Subcutaneous Daily   midodrine  10 mg Oral BID   pantoprazole  40 mg Oral BID   sodium chloride flush  3 mL Intravenous Q12H   tamsulosin  0.4 mg Oral QHS   Continuous Infusions:   Assessment & Plan:   Decompensated HFrEF Severe AS with episodes of syncope and orthostatic hypotension Hypotension requiring midodrine EF 25 to 30% with grade 1 DD Tolerating diuresis with good effect, on Lasix 60 mg daily GDMT limited by hypotension Patient has been told he is not a good candidate for TAVR Goals for care discussed given severe AS complicated by syncope  Melena Patient has been transfused a total of 4 unit PRBC Has ongoing intermittent melena, continues on antiplatelets for recent PCI Eliquis is being held Had initially  thought he did not want EGD but now is requesting EGD, GI aware  CAD Elevated troponins thought to be secondary to demand ischemia No further workup is planned Continue aspirin, Plavix and statin  Paroxysmal atrial fibrillation CHA2DS2-VASc 2 score is 7 But was being held secondary to melena  Poorly controlled DM 2 About blood sugars with intermittent hypoglycemia Continue present management Last A1c was 9.1  BPH Continue Flomax  GOC Prescient palliative care involvement Patient remains DNR Will continue more aggressive management at present with EGD and referral to rehab Will continue to discuss poor prognosis given severe AS, decompensated heart failure, hypotension and intermittent syncope in setting of ongoing melena and GI bleed    DVT prophylaxis: SCD Code Status: DNR Family Communication: Daughter and granddaughters were at bedside throughout     Studies: No results found.  Principal Problem:   HFrEF (heart failure with reduced ejection fraction) (HCC) Active Problems:   Acute on chronic systolic (congestive) heart failure (HCC)   Nonrheumatic aortic valve stenosis   Elevated troponin   CAD S/P percutaneous coronary angioplasty   Symptomatic anemia   GI bleed   Paroxysmal atrial fibrillation (HCC)   Uncontrolled type 2 diabetes mellitus with hyperglycemia, with long-term current use of insulin (HCC)   Orthostatic hypotension   BPH (benign prostatic hyperplasia)   Tubular adenoma of colon   GERD (gastroesophageal reflux disease)   Ischemic cardiomyopathy   Demand ischemia (HCC)   Elevated troponin level not due myocardial infarction   Atrial fibrillation with controlled ventricular rate (HCC)   Near syncope   S/P coronary artery stent placement   Melena   Anemia due to chronic blood loss   Long term (current) use of antithrombotics/antiplatelets   Palliative care by specialist   Goals of care, counseling/discussion   DNR (do not  resuscitate)     Magdalene School, [facility]  If 7PM-7AM, please contact night-coverage [email or web address]   LOS: 6 days

## 2023-12-19 NOTE — Progress Notes (Addendum)
 Daily Progress Note  DOA: 12/13/2023 Hospital Day: 7   Chief Complaint: GI bleed with melena  ASSESSMENT    83 y.o. year old male with multiple medical problems not limited to chronic systolic heart failure, severe AS, PAF on chronic anticoagulation, COPD, CVA, hepatocellular carcinoma, colon polyps.  Patient admitted now with acute on chronic systolic heart failure, acute on chronic anemia with reported melena, syncope.  GI saw in consult for 12/13/23 for heme positive anemia  Acute on chronic  anemia / melena on Plavix and asa.  Our GI team last week offered upper endoscopy.  Case was discussed Anesthesiologist who agreed that if patient  was clinically stable and not having brisk GI bleeding then it would be best to put off his EGD until Monday to have fully staffed anesthesia services for a high risk sedation case. Patient / family had pulled back some on care but today patient decided he wants to proceed with an EGD Today:  No BMs / bleeding but was passing black stool yesterday. Hgb stable, actually up to 9.0  Elevated troponin  CAD status post DES in March 2025.  On DAPT   Severe AS Was recently being worked up for TAVR at [facility]   Acute on chronic heart failure Undergoing diuresis  Prior CVA, Eliquis on hold  History of hepatocellular carcinoma S/p Y90 in October 2023    PLAN   Patient would like to proceed with EGD. I discussed with patient and family that he is at high risk for procedures. Daughter very concerned about proceeding but patient wants to proceed. I think it would make a difference in patient's decision if he knew whether an EGD would be would be purely diagnostic or if any intervention could be done in setting of DAPT. Dr. Leonides Schanz will make that decision when she sees him later today.  I spoke to Cardiology outside the room and he recommends waiting until at least Wed for an EGD just give time for Palliative Care to evaluate and we can make sure everyone is  on same page.    Subjective   No black stools or any BMs today. Feels okay. Daughter and other family members in room   Objective    Recent Labs    12/17/23 0341 12/17/23 1952 12/18/23 0258 12/19/23 0311  WBC 5.1  --  4.9 4.9  HGB 7.5* 8.8* 8.6* 9.0*  HCT 25.0* 28.9* 28.5* 29.2*  MCV 99.2  --  97.3 98.0  PLT 208  --  188 201   Recent Labs    12/17/23 0341 12/18/23 0258 12/19/23 0311  NA 138 140 140  K 3.9 3.7 3.8  CL 103 104 103  CO2 28 29 29   GLUCOSE 204* 129* 213*  BUN 26* 26* 25*  CREATININE 1.07 0.97 1.12  CALCIUM 7.7* 7.9* 7.9*     Scheduled inpatient medications:   aspirin EC  81 mg Oral Daily   atorvastatin  80 mg Oral Daily   clopidogrel  75 mg Oral Daily   dextrose  1 ampule Intravenous Once   furosemide  60 mg Intravenous Daily   insulin aspart  0-15 Units Subcutaneous TID WC   insulin glargine-yfgn  6 Units Subcutaneous Daily   midodrine  10 mg Oral BID   pantoprazole  40 mg Oral BID   sodium chloride flush  3 mL Intravenous Q12H   tamsulosin  0.4 mg Oral QHS   Continuous inpatient infusions:  PRN inpatient medications: acetaminophen **OR**  acetaminophen, albuterol, dextrose  Vital signs in last 24 hours: Temp:  [97.9 F (36.6 C)-98.3 F (36.8 C)] 98.3 F (36.8 C) (04/14 0815) Pulse Rate:  [69-95] 95 (04/14 0715) Resp:  [16-20] 19 (04/14 0815) BP: (98-128)/(55-76) 101/57 (04/14 0715) SpO2:  [95 %-99 %] 98 % (04/14 0715) Weight:  [108.9 kg] 108.9 kg (04/14 0532) Last BM Date : 12/18/23  Intake/Output Summary (Last 24 hours) at 12/19/2023 0959 Last data filed at 12/19/2023 0911 Gross per 24 hour  Intake 1263 ml  Output 1275 ml  Net -12 ml    Intake/Output from previous day: 04/13 0701 - 04/14 0700 In: 1083 [P.O.:1080; I.V.:3] Out: 950 [Urine:950] Intake/Output this shift: Total I/O In: 423 [P.O.:420; I.V.:3] Out: 325 [Urine:325]   Physical Exam:  General: Alert male in NAD Heart:  Regular rate .  Pulmonary: Normal  respiratory effort Abdomen: Soft, nondistended, nontender. Normal bowel sounds. Extremities: No lower extremity edema  Neurologic: Alert and oriented Psych: Pleasant. Cooperative    LOS: 6 days   Willette Cluster ,NP 12/19/2023, 9:59 AM

## 2023-12-19 NOTE — Plan of Care (Signed)
   Problem: Coping: Goal: Ability to adjust to condition or change in health will improve Outcome: Progressing   Problem: Fluid Volume: Goal: Ability to maintain a balanced intake and output will improve Outcome: Progressing   Problem: Metabolic: Goal: Ability to maintain appropriate glucose levels will improve Outcome: Progressing

## 2023-12-19 NOTE — H&P (View-Only) (Signed)
 Daily Progress Note  DOA: 12/13/2023 Hospital Day: 7   Chief Complaint: GI bleed with melena  ASSESSMENT    83 y.o. year old male with multiple medical problems not limited to chronic systolic heart failure, severe AS, PAF on chronic anticoagulation, COPD, CVA, hepatocellular carcinoma, colon polyps.  Patient admitted now with acute on chronic systolic heart failure, acute on chronic anemia with reported melena, syncope.  GI saw in consult for 12/13/23 for heme positive anemia  Acute on chronic  anemia / melena on Plavix and asa.  Our GI team last week offered upper endoscopy.  Case was discussed Anesthesiologist who agreed that if patient  was clinically stable and not having brisk GI bleeding then it would be best to put off his EGD until Monday to have fully staffed anesthesia services for a high risk sedation case. Patient / family had pulled back some on care but today patient decided he wants to proceed with an EGD Today:  No BMs / bleeding but was passing black stool yesterday. Hgb stable, actually up to 9.0  Elevated troponin  CAD status post DES in March 2025.  On DAPT   Severe AS Was recently being worked up for TAVR at [facility]   Acute on chronic heart failure Undergoing diuresis  Prior CVA, Eliquis on hold  History of hepatocellular carcinoma S/p Y90 in October 2023    PLAN   Patient would like to proceed with EGD. I discussed with patient and family that he is at high risk for procedures. Daughter very concerned about proceeding but patient wants to proceed. I think it would make a difference in patient's decision if he knew whether an EGD would be would be purely diagnostic or if any intervention could be done in setting of DAPT. Dr. Leonides Schanz will make that decision when she sees him later today.  I spoke to Cardiology outside the room and he recommends waiting until at least Wed for an EGD just give time for Palliative Care to evaluate and we can make sure everyone is  on same page.    Subjective   No black stools or any BMs today. Feels okay. Daughter and other family members in room   Objective    Recent Labs    12/17/23 0341 12/17/23 1952 12/18/23 0258 12/19/23 0311  WBC 5.1  --  4.9 4.9  HGB 7.5* 8.8* 8.6* 9.0*  HCT 25.0* 28.9* 28.5* 29.2*  MCV 99.2  --  97.3 98.0  PLT 208  --  188 201   Recent Labs    12/17/23 0341 12/18/23 0258 12/19/23 0311  NA 138 140 140  K 3.9 3.7 3.8  CL 103 104 103  CO2 28 29 29   GLUCOSE 204* 129* 213*  BUN 26* 26* 25*  CREATININE 1.07 0.97 1.12  CALCIUM 7.7* 7.9* 7.9*     Scheduled inpatient medications:   aspirin EC  81 mg Oral Daily   atorvastatin  80 mg Oral Daily   clopidogrel  75 mg Oral Daily   dextrose  1 ampule Intravenous Once   furosemide  60 mg Intravenous Daily   insulin aspart  0-15 Units Subcutaneous TID WC   insulin glargine-yfgn  6 Units Subcutaneous Daily   midodrine  10 mg Oral BID   pantoprazole  40 mg Oral BID   sodium chloride flush  3 mL Intravenous Q12H   tamsulosin  0.4 mg Oral QHS   Continuous inpatient infusions:  PRN inpatient medications: acetaminophen **OR**  acetaminophen, albuterol, dextrose  Vital signs in last 24 hours: Temp:  [97.9 F (36.6 C)-98.3 F (36.8 C)] 98.3 F (36.8 C) (04/14 0815) Pulse Rate:  [69-95] 95 (04/14 0715) Resp:  [16-20] 19 (04/14 0815) BP: (98-128)/(55-76) 101/57 (04/14 0715) SpO2:  [95 %-99 %] 98 % (04/14 0715) Weight:  [108.9 kg] 108.9 kg (04/14 0532) Last BM Date : 12/18/23  Intake/Output Summary (Last 24 hours) at 12/19/2023 0959 Last data filed at 12/19/2023 0911 Gross per 24 hour  Intake 1263 ml  Output 1275 ml  Net -12 ml    Intake/Output from previous day: 04/13 0701 - 04/14 0700 In: 1083 [P.O.:1080; I.V.:3] Out: 950 [Urine:950] Intake/Output this shift: Total I/O In: 423 [P.O.:420; I.V.:3] Out: 325 [Urine:325]   Physical Exam:  General: Alert male in NAD Heart:  Regular rate .  Pulmonary: Normal  respiratory effort Abdomen: Soft, nondistended, nontender. Normal bowel sounds. Extremities: No lower extremity edema  Neurologic: Alert and oriented Psych: Pleasant. Cooperative    LOS: 6 days   Mai Schwalbe ,NP 12/19/2023, 9:59 AM

## 2023-12-19 NOTE — Plan of Care (Signed)
   Brief Palliative Medicine Progress Note:  PMT consult received and chart reviewed. Goals of care completed, full note to follow.  Recommendations: DNR-limited Open the EGD Hopeful for improvement Open to rehab stay for strengthening to improve quality of life Understands unlikely candidate for TAVR Open to outpatient palliative care for ongoing discussions as clinical picture evolves in the long-term Palliative medicine will follow-up in 2-3 days to allow time for EGD   Thank you for allowing us  to participate in the care of Mathew Baker  Signed by: Lizbeth Right, NP Palliative Medicine Team [facility] CHARGE  Team Phone # [phone] (Nights/Weekends)  12/19/2023, 4:57 PM

## 2023-12-19 NOTE — Progress Notes (Deleted)
   Brief Palliative Medicine Progress Note:  PMT consult received and chart reviewed. Goals of care completed, full note to follow.  Recommendations: DNR-limited Open the EGD Hopeful for improvement Open to rehab stay for strengthening to improve quality of life Understands unlikely candidate for TAVR Open to outpatient palliative care for ongoing discussions as clinical picture evolves in the long-term Palliative medicine will follow-up in 2-3 days to allow time for EGD   Thank you for allowing us  to participate in the care of Mathew Baker  Signed by: Lizbeth Right, NP Palliative Medicine Team [facility] CHARGE  Team Phone # [phone] (Nights/Weekends)  12/19/2023, 4:57 PM

## 2023-12-19 NOTE — Progress Notes (Signed)
 Progress Note  Patient Name: Mathew Baker Date of Encounter: 12/19/2023 Primary Cardiologist: Kateri [facility] [place]  Subjective   Palliative care called 12/08/23.  Pending recs He has changed his mind and would now like to pursue EGD.  HE feels he is strong and would make it through EGD and TAVR.  Vital Signs    Vitals:   12/19/23 0016 12/19/23 0532 12/19/23 0715 12/19/23 0815  BP: (!) 100/55 111/62 (!) 101/57   Pulse:  76 95   Resp: 16 18 20 19   Temp: 98.3 F (36.8 C) 98.2 F (36.8 C) 97.9 F (36.6 C) 98.3 F (36.8 C)  TempSrc: Oral Oral Oral Oral  SpO2: 95% 98% 98%   Weight:  108.9 kg    Height:        Intake/Output Summary (Last 24 hours) at 12/19/2023 0914 Last data filed at 12/19/2023 0911 Gross per 24 hour  Intake 1263 ml  Output 1275 ml  Net -12 ml   Filed Weights   12/15/23 0335 12/16/23 0440 12/19/23 0532  Weight: 109.2 kg 110.2 kg 108.9 kg    Physical Exam   GEN: No acute distress.   Cardiac: IRRR, no rubs, or gallops. Systolic crescendo murmur Respiratory: Clear to auscultation bilaterally. GI: Soft, nontender, non-distended  MS: No edema  Labs   Telemetry: AF rate controlled   Chemistry Recent Labs  Lab 12/13/23 0910 12/14/23 0231 12/17/23 0341 12/18/23 0258 12/19/23 0311  NA 139   < > 138 140 140  K 3.9   < > 3.9 3.7 3.8  CL 107   < > 103 104 103  CO2 24   < > 28 29 29   GLUCOSE 62*   < > 204* 129* 213*  BUN 29*   < > 26* 26* 25*  CREATININE 1.00   < > 1.07 0.97 1.12  CALCIUM 7.9*   < > 7.7* 7.9* 7.9*  PROT 5.3*  --   --   --   --   ALBUMIN 2.7*  --   --   --   --   AST 31  --   --   --   --   ALT 21  --   --   --   --   ALKPHOS 56  --   --   --   --   BILITOT 1.0  --   --   --   --   GFRNONAA >60   < > >60 >60 >60  ANIONGAP 8   < > 7 7 8    < > = values in this interval not displayed.     Hematology Recent Labs  Lab 12/17/23 0341 12/17/23 1952 12/18/23 0258 12/19/23 0311  WBC 5.1  --  4.9 4.9  RBC  2.52*  --  2.93* 2.98*  HGB 7.5* 8.8* 8.6* 9.0*  HCT 25.0* 28.9* 28.5* 29.2*  MCV 99.2  --  97.3 98.0  MCH 29.8  --  29.4 30.2  MCHC 30.0  --  30.2 30.8  RDW 26.5*  --  26.6* 26.6*  PLT 208  --  188 201    BNP Recent Labs  Lab 12/13/23 0910  BNP 540.4*      Cardiac Studies   Cardiac Studies & Procedures   ______________________________________________________________________________________________ CARDIAC CATHETERIZATION  CARDIAC CATHETERIZATION 11/21/2023  Narrative   Prox RCA lesion is 75% stenosed.   Mid RCA-1 lesion is 75% stenosed.   Mid RCA-2 lesion is 75% stenosed.   Mid  RCA to Dist RCA lesion is 90% stenosed.   Dist RCA lesion is 50% stenosed with 50% stenosed side branch in RPAV.   Ost RCA to Prox RCA lesion is 50% stenosed.   Ost LM to Mid LM lesion is 70% stenosed.   Dist LM lesion is 90% stenosed.   Ost Cx lesion is 100% stenosed.   Ost LAD to Prox LAD lesion is 95% stenosed.   Prox LAD to Dist LAD lesion is 50% stenosed.   2nd Diag lesion is 75% stenosed.   There is moderate to severe left ventricular systolic dysfunction.   LV end diastolic pressure is moderately elevated.   The left ventricular ejection fraction is 25-35% by visual estimate.   Anticipated discharge date to be determined.   Recommend to resume Apixaban, at currently prescribed dose and frequency.  Conclusion Left heart cath right radial approach Non-STEMI shortness of breath  Left ventriculogram Left ventricular enlargement global hypokinesis EF around 30%  Coronaries Left main large moderate to severe calcification 70% distal LAD large 90% ostial 95% proximal diffuse 50% moderate to severe calcification proximally Circumflex 100% occluded ostially moderate to severe calcification RCA large serial 75% lesions mid heavy calcification 90% mid to distal moderate calcification right dominant system moderate disease diffusely distally  Intervention deferred Recommend evaluation for  high risk coronary bypass surgery versus high risk PCI and stent for complex disease Medical therapy is also not unreasonable Will discuss images with tertiary care facility  Findings Coronary Findings Diagnostic  Dominance: Right  Left Main Ost LM to Mid LM lesion is 70% stenosed. Vessel is not the culprit lesion. The lesion is type C, located at the major branch and discrete. The lesion is moderately calcified. Dist LM lesion is 90% stenosed. Vessel is not the culprit lesion. The lesion is type C and located at the bifurcation. The lesion is moderately calcified.  Left Anterior Descending Ost LAD to Prox LAD lesion is 95% stenosed. The lesion is moderately calcified. Prox LAD to Dist LAD lesion is 50% stenosed. Vessel is not the culprit lesion. The lesion is type C and concentric. The lesion is mildly calcified. The lesion was not previously treated . The stenosis was measured by a visual reading. Pressure gradient was not performed on the lesion. IVUS was not performed. No optical coherence tomography (OCT) was performed.  Second Diagonal Branch 2nd Diag lesion is 75% stenosed. Vessel is not the culprit lesion. The lesion is type C and discrete.  Left Circumflex Ost Cx lesion is 100% stenosed. Vessel is not the culprit lesion. The lesion is not complex (non high-C), located at the bifurcation, discrete and chronically occluded. The lesion is severely calcified.  Right Coronary Artery Ost RCA to Prox RCA lesion is 50% stenosed. The lesion is moderately calcified. Prox RCA lesion is 75% stenosed. The lesion is irregular. The lesion is severely calcified. Mid RCA-1 lesion is 75% stenosed. The lesion is severely calcified. Mid RCA-2 lesion is 75% stenosed. The lesion is severely calcified. Mid RCA to Dist RCA lesion is 90% stenosed. The lesion is moderately calcified. Dist RCA lesion is 50% stenosed with 50% stenosed side branch in RPAV. Vessel is not the culprit lesion. The lesion is  eccentric. The lesion was not previously treated . The stenosis was measured by a visual reading.  Intervention  No interventions have been documented.   STRESS TESTS  NM MYOCAR MULTI W/SPECT W 12/21/2016  Narrative CLINICAL DATA:  Chest pain  EXAM: MYOCARDIAL IMAGING WITH SPECT (  REST AND PHARMACOLOGIC-STRESS)  GATED LEFT VENTRICULAR WALL MOTION STUDY  LEFT VENTRICULAR EJECTION FRACTION  TECHNIQUE: Standard myocardial SPECT imaging was performed after resting intravenous injection of 10 mCi Tc-36m tetrofosmin. Subsequently, intravenous infusion of Lexiscan was performed under the supervision of the Cardiology staff. At peak effect of the drug, 30 mCi Tc-76m tetrofosmin was injected intravenously and standard myocardial SPECT imaging was performed. Quantitative gated imaging was also performed to evaluate left ventricular wall motion, and estimate left ventricular ejection fraction.  COMPARISON:  None.  FINDINGS: Perfusion: There is fixed thinning of the inferior wall. Attenuation artifact is present at the apex. No stress-induced ischemia.  Wall Motion: Normal left ventricular wall motion. No left ventricular dilation.  Left Ventricular Ejection Fraction: 57 %  End diastolic volume 104 ml  End systolic volume 45 ml  IMPRESSION: 1. There is fixed thinning of the inferior wall and attenuation artifact at the apex. No stress-induced ischemia.  2. Normal left ventricular wall motion.  3. Left ventricular ejection fraction 57%  4. Non invasive risk stratification*: Low risk.  *2012 Appropriate Use Criteria for Coronary Revascularization Focused Update: J Am Coll Cardiol. 2012;59(9):857-881. http://content.dementiazones.com.aspx?articleid=1201161   Electronically Signed By: Artice Last M.D. On: 12/21/2016 13:58   ECHOCARDIOGRAM  ECHOCARDIOGRAM COMPLETE 11/19/2023  Narrative ECHOCARDIOGRAM REPORT    Patient Name:   TERRIAN SENTELL Date of  Exam: 11/19/2023 Medical Rec #:  [id number]           Height: Accession #:    [id number]          Weight: Date of Birth:  May 08, 1941           BSA: Patient Age:    82 years            BP:           99/64 mmHg Patient Gender: M                   HR:           85 bpm. Exam Location:  [facility]  Procedure: 2D Echo, Cardiac Doppler and Color Doppler (Both Spectral and Color Flow Doppler were utilized during procedure).  Indications:     Acutre respiratory distress R06.03  History:         Patient has prior history of Echocardiogram examinations. Stroke; Risk Factors:Hypertension.  Sonographer:     Kathaleen Pale Roar Referring Phys:  Maudine Sos MD Diagnosing Phys: Maudine Sos MD  IMPRESSIONS   1. Left ventricular ejection fraction, by estimation, is 25 to 30%. The left ventricle has severely decreased function. The left ventricle demonstrates global hypokinesis. There is mild concentric left ventricular hypertrophy. Left ventricular diastolic parameters are consistent with Grade I diastolic dysfunction (impaired relaxation). Elevated left ventricular end-diastolic pressure. 2. Right ventricular systolic function is normal. The right ventricular size is normal. There is normal pulmonary artery systolic pressure. 3. Left atrial size was moderately dilated. 4. The mitral valve is normal in structure. Mild mitral valve regurgitation. No evidence of mitral stenosis. 5. Dimensionless index in 0.23, consistent with severe aortic stenosis. The severity of the aortic stenosis is likely underestimated by mean gradient due to low flow caused by systolic dysfunction.. The aortic valve is calcified. There is severe calcifcation of the aortic valve. There is severe thickening of the aortic valve. Aortic valve regurgitation is mild to moderate. Severe aortic valve stenosis. Aortic valve mean gradient measures 21.0 mmHg. Aortic valve Vmax measures 2.95 m/s. 6. The inferior vena cava is dilated  in size with  <50% respiratory variability, suggesting right atrial pressure of 15 mmHg.  FINDINGS Left Ventricle: Left ventricular ejection fraction, by estimation, is 25 to 30%. The left ventricle has severely decreased function. The left ventricle demonstrates global hypokinesis. The left ventricular internal cavity size was normal in size. There is mild concentric left ventricular hypertrophy. Left ventricular diastolic parameters are consistent with Grade I diastolic dysfunction (impaired relaxation). Elevated left ventricular end-diastolic pressure.  Right Ventricle: The right ventricular size is normal. No increase in right ventricular wall thickness. Right ventricular systolic function is normal. There is normal pulmonary artery systolic pressure. The tricuspid regurgitant velocity is 2.09 m/s, and with an assumed right atrial pressure of 15 mmHg, the estimated right ventricular systolic pressure is 32.5 mmHg.  Left Atrium: Left atrial size was moderately dilated.  Right Atrium: Right atrial size was normal in size.  Pericardium: There is no evidence of pericardial effusion.  Mitral Valve: The mitral valve is normal in structure. Mild mitral valve regurgitation. No evidence of mitral valve stenosis.  Tricuspid Valve: The tricuspid valve is normal in structure. Tricuspid valve regurgitation is trivial. No evidence of tricuspid stenosis.  Aortic Valve: Dimensionless index in 0.23, consistent with severe aortic stenosis. The severity of the aortic stenosis is likely underestimated by mean gradient due to low flow caused by systolic dysfunction. The aortic valve is calcified. There is severe calcifcation of the aortic valve. There is severe thickening of the aortic valve. Aortic valve regurgitation is mild to moderate. Severe aortic stenosis is present. Aortic valve mean gradient measures 21.0 mmHg. Aortic valve peak gradient measures 34.8 mmHg.  Pulmonic Valve: The pulmonic valve was normal in  structure. Pulmonic valve regurgitation is not visualized. No evidence of pulmonic stenosis.  Aorta: The aortic root is normal in size and structure.  Venous: The inferior vena cava is dilated in size with less than 50% respiratory variability, suggesting right atrial pressure of 15 mmHg.  IAS/Shunts: No atrial level shunt detected by color flow Doppler.   LEFT VENTRICLE PLAX 2D LVIDd:         5.90 cm Diastology LVIDs:         5.00 cm LV e' medial:    3.90 cm/s LV PW:         1.10 cm LV E/e' medial:  34.1 LV IVS:        1.30 cm LV e' lateral:   4.65 cm/s LV E/e' lateral: 28.6   RIGHT VENTRICLE RV Basal diam:  3.60 cm RV Mid diam:    3.40 cm RV Length:      6.60 cm RV S prime:     1340.00 cm/s TAPSE (M-mode): 1.7 cm  LEFT ATRIUM LA diam:      4.00 cm LA Vol (A2C): 27.9 ml LA Vol (A4C): 80.3 ml AORTIC VALVE                    PULMONIC VALVE AV Vmax:           295.00 cm/s  PV Vmax:       83.40 m/s AV Vmean:          173.000 cm/s PV Peak grad:  27822.2 mmHg AV VTI:            0.576 m AV Peak Grad:      34.8 mmHg AV Mean Grad:      21.0 mmHg LVOT Vmax:         67.90 cm/s LVOT Vmean:  43.200 cm/s LVOT VTI:          0.134 m LVOT/AV VTI ratio: 0.23  AORTA Ao STJ diam: 2.9 cm Ao Asc diam: 3.90 cm  MV E velocity: 133.00 cm/s  TRICUSPID VALVE TV Peak grad:   17.5 mmHg TV Vmax:        2.09 m/s TR Peak grad:   17.5 mmHg TR Vmax:        209.00 cm/s  SHUNTS Systemic VTI: 0.13 m  Maudine Sos MD Electronically signed by Maudine Sos MD Signature Date/Time: 11/21/2023/5:53:04 AM    Final   TEE  ECHO TEE 09/15/2020  Narrative TRANSESOPHOGEAL ECHO REPORT    Patient Name:   Elenore Griffon Date of Exam: 09/15/2020 Medical Rec #:  [id number]     Height:       73.0 in Accession #:    [id number]    Weight:       231.0 lb Date of Birth:  [birth year]     BSA:          2.288 m Patient Age:    79 years      BP:           129/66 mmHg Patient Gender: M              HR:           55 bpm. Exam Location:  [facility]  Procedure: Transesophageal Echo, Color Doppler, Cardiac Doppler and Saline Contrast Bubble Study  Indications:     Bacteremia 790.7/ R78.81  History:         Patient has prior history of Echocardiogram examinations, most recent 09/14/2020. Stroke; Risk Factors:Hypertension and Diabetes.  Sonographer:     Broadus Canes RDCS (AE) Referring Phys:  [id number] Ronney Cola Diagnosing Phys: Starlette Ebbs MD  PROCEDURE: After discussion of the risks and benefits of a TEE, an informed consent was obtained from the patient. TEE procedure time was 30 minutes. The transesophogeal probe was passed without difficulty through the esophogus of the patient. Imaged were obtained with the patient in a left lateral decubitus position. Sedation performed by different physician. Image quality was excellent. The patient's vital signs; including heart rate, blood pressure, and oxygen saturation; remained stable throughout the procedure. The patient developed no complications during the procedure.  IMPRESSIONS   1. Left ventricular ejection fraction, by estimation, is 55 to 60%. The left ventricle has normal function. The left ventricle has no regional wall motion abnormalities. Left ventricular diastolic parameters were normal. 2. Right ventricular systolic function is normal. The right ventricular size is normal. 3. No left atrial/left atrial appendage thrombus was detected. 4. The mitral valve is normal in structure. Trivial mitral valve regurgitation. 5. The aortic valve is calcified. Aortic valve regurgitation is trivial.  Conclusion(s)/Recommendation(s): No evidence of vegetation/infective endocarditis on this transesophageal echocardiogram.  FINDINGS Left Ventricle: Left ventricular ejection fraction, by estimation, is 55 to 60%. The left ventricle has normal function. The left ventricle has no regional wall motion abnormalities. The left ventricular internal  cavity size was normal in size. Left ventricular diastolic parameters were normal.  Right Ventricle: The right ventricular size is normal. No increase in right ventricular wall thickness. Right ventricular systolic function is normal.  Left Atrium: Left atrial size was normal in size. No left atrial/left atrial appendage thrombus was detected.  Right Atrium: Right atrial size was normal in size.  Pericardium: There is no evidence of pericardial effusion.  Mitral Valve: The mitral valve is normal in  structure. Trivial mitral valve regurgitation.  Tricuspid Valve: The tricuspid valve is grossly normal. Tricuspid valve regurgitation is trivial.  Aortic Valve: The aortic valve is calcified. Aortic valve regurgitation is trivial. There is no evidence of aortic valve vegetation.  Pulmonic Valve: The pulmonic valve was not well visualized. Pulmonic valve regurgitation is not visualized.  Aorta: The aortic root is normal in size and structure.  IAS/Shunts: No atrial level shunt detected by color flow Doppler. Agitated saline contrast was given intravenously to evaluate for intracardiac shunting.  Starlette Ebbs MD Electronically signed by Starlette Ebbs MD Signature Date/Time: 09/15/2020/1:28:53 PM    Final        ______________________________________________________________________________________________        Assessment & Plan    Goals of Care He has a DNR order with limited nature. Discussion about considering more conservative goals of care given his multiple comorbidities and current health status.  - Our group would not offer TAVR at this time unless he would be able to tolerate AC; this would be done as an outpatient; his symptomatic hypotension requiring high dose midodrine would be a challenge - I have discussed with his GI team in depth; they note this would be a diagnostic EGD only - reviewed care with family at length.  I think PC would be helpful in clarification of  his goals as I am unclear if he is being offered much from a therapeutic perspective.  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 Multidisciplinary team involved in decision-making process. - See 12/16/23 notes for SDM meeting - Continue DAPT until December 29, 2023 - Transition to monotherapy with Plavix post-December 29, 2023, if hemoglobin stable - I am concerned about his long term prognosis  Severe Aortic Stenosis - no plans fot TAVR as this time  HFrEF - acute on chronic, euvolemic today - will set diuretic goal tomorrow after PC evaluation  Symptomatic Hypotension - Symptomatic hypotension with near syncope and multiple falls. Currently on midodrine to manage hypotension.       For questions or updates, please contact [facility] Please consult [email or web address] for contact info under Cardiology/STEMI.      Gloriann Larger, MD FASE [facility] Cardiologist [facility]  [address] [place], [address] [place], [place] [address] [phone]  9:14 AM

## 2023-12-19 NOTE — Progress Notes (Signed)
 PT Cancellation Note  Patient Details Name: Mathew Baker MRN: [id number] DOB: 11-29-1940   Cancelled Treatment:    Reason Eval/Treat Not Completed: Patient at procedure or test/unavailable  Patient reports MD and daughter are about to arrive for a meeting. He requested to hold PT at this time and hopes PT can return later. Will see as schedule permits.   Gayle Kava, PT Acute Rehabilitation Services  Office [phone]  [place] 12/19/2023, 11:31 AM

## 2023-12-19 NOTE — TOC Progression Note (Signed)
 Transition of Care [facility]) - Progression Note    Patient Details  Name: Mathew Baker MRN: [id number] Date of Birth: September 03, 1941  [facility] CM/SW Contact  Arron Big, [place] Phone Number: 12/19/2023, 11:14 AM  Clinical Narrative:   Per daily meeting with treatment team, Palliative has been consulted. Patient is not MR at this time.   TOC will continue to follow.    Expected Discharge Plan: Skilled Nursing Facility Barriers to Discharge: Continued Medical Work up  Expected Discharge Plan and Services In-house Referral: Clinical Social Work     Living arrangements for the past 2 months: Single Family Home                             HH Agency: [facility] Date HH Agency Contacted: 12/08/23   Representative spoke with at [facility] Agency: Shaun   Social Determinants of Health (SDOH) Interventions SDOH Screenings   Food Insecurity: No Food Insecurity (12/13/2023)  Housing: Low Risk  (12/13/2023)  Transportation Needs: No Transportation Needs (12/13/2023)  Utilities: Not At Risk (12/13/2023)  Financial Resource Strain: Low Risk  (11/26/2023)   Received from [facility]  Social Connections: Moderately Isolated (12/13/2023)  Tobacco Use: Medium Risk (12/13/2023)    Readmission Risk Interventions     No data to display

## 2023-12-20 ENCOUNTER — Inpatient Hospital Stay (HOSPITAL_COMMUNITY): Admitting: Anesthesiology

## 2023-12-20 ENCOUNTER — Encounter (HOSPITAL_COMMUNITY): Payer: Self-pay | Admitting: Internal Medicine

## 2023-12-20 ENCOUNTER — Encounter (HOSPITAL_COMMUNITY)
Admission: EM | Disposition: A | Discharge status: Skilled Nursing Facility | Payer: Self-pay | Source: Home / Self Care | Attending: Internal Medicine

## 2023-12-20 DIAGNOSIS — Z87891 Personal history of nicotine dependence: Secondary | ICD-10-CM

## 2023-12-20 DIAGNOSIS — K922 Gastrointestinal hemorrhage, unspecified: Secondary | ICD-10-CM | POA: Diagnosis not present

## 2023-12-20 DIAGNOSIS — K31A19 Gastric intestinal metaplasia without dysplasia, unspecified site: Secondary | ICD-10-CM

## 2023-12-20 DIAGNOSIS — K921 Melena: Secondary | ICD-10-CM

## 2023-12-20 DIAGNOSIS — K222 Esophageal obstruction: Secondary | ICD-10-CM

## 2023-12-20 DIAGNOSIS — K2950 Unspecified chronic gastritis without bleeding: Secondary | ICD-10-CM

## 2023-12-20 DIAGNOSIS — I2510 Atherosclerotic heart disease of native coronary artery without angina pectoris: Secondary | ICD-10-CM

## 2023-12-20 DIAGNOSIS — K449 Diaphragmatic hernia without obstruction or gangrene: Secondary | ICD-10-CM

## 2023-12-20 DIAGNOSIS — D649 Anemia, unspecified: Secondary | ICD-10-CM | POA: Diagnosis not present

## 2023-12-20 DIAGNOSIS — K3189 Other diseases of stomach and duodenum: Secondary | ICD-10-CM

## 2023-12-20 HISTORY — PX: BONE BIOPSY: SHX375

## 2023-12-20 HISTORY — PX: ESOPHAGOGASTRODUODENOSCOPY: SHX5428

## 2023-12-20 LAB — GLUCOSE, CAPILLARY
Glucose-Capillary: 185 mg/dL — ABNORMAL HIGH (ref 70–99)
Glucose-Capillary: 161 mg/dL — ABNORMAL HIGH (ref 70–99)
Glucose-Capillary: 174 mg/dL — ABNORMAL HIGH (ref 70–99)
Glucose-Capillary: 277 mg/dL — ABNORMAL HIGH (ref 70–99)
Glucose-Capillary: 292 mg/dL — ABNORMAL HIGH (ref 70–99)

## 2023-12-20 LAB — RENAL FUNCTION PANEL
Albumin: 2.6 g/dL — ABNORMAL LOW (ref 3.5–5.0)
Anion gap: 5 (ref 5–15)
BUN: 23 mg/dL (ref 8–23)
CO2: 32 mmol/L (ref 22–32)
Calcium: 7.9 mg/dL — ABNORMAL LOW (ref 8.9–10.3)
Chloride: 104 mmol/L (ref 98–111)
Creatinine, Ser: 1.06 mg/dL (ref 0.61–1.24)
GFR, Estimated: >60 mL/min (ref >60)
Glucose, Bld: 211 mg/dL — ABNORMAL HIGH (ref 70–99)
Phosphorus: 1.3 mg/dL — ABNORMAL LOW (ref 2.5–4.6)
Potassium: 3.7 mmol/L (ref 3.5–5.1)
Sodium: 141 mmol/L (ref 135–145)

## 2023-12-20 LAB — CBC
HCT: 29.1 % — ABNORMAL LOW (ref 39.0–52.0)
Hemoglobin: 8.9 g/dL — ABNORMAL LOW (ref 13.0–17.0)
MCH: 30.3 pg (ref 26.0–34.0)
MCHC: 30.6 g/dL (ref 30.0–36.0)
MCV: 99 fL (ref 80.0–100.0)
Platelets: 193 10*3/uL (ref 150–400)
RBC: 2.94 MIL/uL — ABNORMAL LOW (ref 4.22–5.81)
RDW: 26 % — ABNORMAL HIGH (ref 11.5–15.5)
WBC: 4.8 10*3/uL (ref 4.0–10.5)
nRBC: 0 % (ref 0.0–0.2)

## 2023-12-20 LAB — PHOSPHORUS: Phosphorus: 2.7 mg/dL (ref 2.5–4.6)

## 2023-12-20 SURGERY — EGD (ESOPHAGOGASTRODUODENOSCOPY)
Anesthesia: Monitor Anesthesia Care

## 2023-12-20 MED ORDER — SODIUM CHLORIDE 0.9 % IV SOLN
INTRAVENOUS | Status: AC | PRN
  Administered 2023-12-20: 250 mL via INTRAVENOUS

## 2023-12-20 MED ORDER — INSULIN GLARGINE-YFGN 100 UNIT/ML ~~LOC~~ SOLN
8.0000 [IU] | Freq: Every day | SUBCUTANEOUS | Status: DC
  Administered 2023-12-21: 8 [IU] via SUBCUTANEOUS
  Filled 2023-12-20: qty 0.08

## 2023-12-20 MED ORDER — POTASSIUM PHOSPHATES 15 MMOLE/5ML IV SOLN
45.0000 mmol | Freq: Once | INTRAVENOUS | Status: AC
  Administered 2023-12-20: 45 mmol via INTRAVENOUS
  Filled 2023-12-20: qty 15

## 2023-12-20 MED ORDER — POLYETHYLENE GLYCOL 3350 17 G PO PACK
17.0000 g | PACK | Freq: Every day | ORAL | Status: DC
  Administered 2023-12-20 – 2023-12-21 (×2): 17 g via ORAL
  Filled 2023-12-20 (×2): qty 1

## 2023-12-20 MED ORDER — PHENYLEPHRINE HCL-NACL 20-0.9 MG/250ML-% IV SOLN
INTRAVENOUS | Status: DC | PRN
  Administered 2023-12-20: 30 ug/min via INTRAVENOUS

## 2023-12-20 MED ORDER — PROPOFOL 500 MG/50ML IV EMUL
INTRAVENOUS | Status: DC | PRN
  Administered 2023-12-20: 40 ug/kg/min via INTRAVENOUS
  Administered 2023-12-20: 10 mg via INTRAVENOUS

## 2023-12-20 MED ORDER — APIXABAN 5 MG PO TABS
5.0000 mg | ORAL_TABLET | Freq: Two times a day (BID) | ORAL | Status: DC
  Administered 2023-12-20 – 2023-12-21 (×2): 5 mg via ORAL
  Filled 2023-12-20 (×2): qty 1

## 2023-12-20 NOTE — Anesthesia Preprocedure Evaluation (Addendum)
 Anesthesia Evaluation  Patient identified by MRN, date of birth, ID band Patient awake    Reviewed: Allergy & Precautions, NPO status , Patient's Chart, lab work & pertinent test results  Airway Mallampati: III  TM Distance: >3 FB Neck ROM: Full    Dental  (+) Edentulous Upper, Edentulous Lower, Dental Advisory Given   Pulmonary COPD, former smoker   Pulmonary exam normal breath sounds clear to auscultation       Cardiovascular hypertension, + CAD, + Past MI, + Cardiac Stents and +CHF  Normal cardiovascular exam+ dysrhythmias Atrial Fibrillation  Rhythm:Regular Rate:Normal  TTE 2025 1. Left ventricular ejection fraction, by estimation, is 25 to 30%. The  left ventricle has severely decreased function. The left ventricle  demonstrates global hypokinesis. There is mild concentric left ventricular  hypertrophy. Left ventricular diastolic   parameters are consistent with Grade I diastolic dysfunction (impaired  relaxation). Elevated left ventricular end-diastolic pressure.   2. Right ventricular systolic function is normal. The right ventricular  size is normal. There is normal pulmonary artery systolic pressure.   3. Left atrial size was moderately dilated.   4. The mitral valve is normal in structure. Mild mitral valve  regurgitation. No evidence of mitral stenosis.   5. Dimensionless index in 0.23, consistent with severe aortic stenosis.  The severity of the aortic stenosis is likely underestimated by mean  gradient due to low flow caused by systolic dysfunction.. The aortic valve  is calcified. There is severe  calcifcation of the aortic valve. There is severe thickening of the aortic  valve. Aortic valve regurgitation is mild to moderate. Severe aortic valve  stenosis. Aortic valve mean gradient measures 21.0 mmHg. Aortic valve Vmax  measures 2.95 m/s.   6. The inferior vena cava is dilated in size with <50% respiratory   variability, suggesting right atrial pressure of 15 mmHg.   Cath 2025 Coronaries Left main large moderate to severe calcification 70% distal LAD large 90% ostial 95% proximal diffuse 50% moderate to severe calcification proximally Circumflex 100% occluded ostially moderate to severe calcification RCA large serial 75% lesions mid heavy calcification 90% mid to distal moderate calcification right dominant system moderate disease diffusely distally   Intervention deferred Recommend evaluation for high risk coronary bypass surgery versus high risk PCI and stent for complex disease Medical therapy is also not unreasonable Will discuss images with tertiary care facility     Neuro/Psych CVA negative neurological ROS  negative psych ROS   GI/Hepatic negative GI ROS, Neg liver ROS,GERD  ,,  Endo/Other  diabetes, Type 2    Renal/GU negative Renal ROS  negative genitourinary   Musculoskeletal  (+) Arthritis ,    Abdominal   Peds  Hematology negative hematology ROS (+) Blood dyscrasia, anemia Lab Results      Component                Value               Date                      WBC                      4.8                 12/20/2023                HGB  8.9 (L)             12/20/2023                HCT                      29.1 (L)            12/20/2023                MCV                      99.0                12/20/2023                PLT                      193                 12/20/2023              Anesthesia Other Findings 70 male w hypertension, chronic systolic CHF, severe AAS, paroxysmal A-fib on chronic anticoagulation, COPD, T2DM, CVA, recent staged PCI x 2 at [facility] where he was diagnosed with HFrEF EF 25 to 30% was admitted with worsening shortness of breath, orthopnea and melena    Reproductive/Obstetrics                             Anesthesia Physical Anesthesia Plan  ASA: 4  Anesthesia Plan: MAC   Post-op Pain  Management:    Induction: Intravenous  PONV Risk Score and Plan: Propofol infusion and Treatment may vary due to age or medical condition  Airway Management Planned: Natural Airway  Additional Equipment:   Intra-op Plan:   Post-operative Plan:   Informed Consent: I have reviewed the patients History and Physical, chart, labs and discussed the procedure including the risks, benefits and alternatives for the proposed anesthesia with the patient or authorized representative who has indicated his/her understanding and acceptance.   Patient has DNR.  Discussed DNR with patient and Continue DNR.   Dental advisory given  Plan Discussed with: CRNA  Anesthesia Plan Comments:        Anesthesia Quick Evaluation

## 2023-12-20 NOTE — Plan of Care (Signed)
  Problem: Coping: Goal: Ability to adjust to condition or change in health will improve Outcome: Progressing   Problem: Health Behavior/Discharge Planning: Goal: Ability to manage health-related needs will improve Outcome: Progressing   Problem: Metabolic: Goal: Ability to maintain appropriate glucose levels will improve Outcome: Progressing   Problem: Safety: Goal: Ability to remain free from injury will improve Outcome: Progressing

## 2023-12-20 NOTE — Progress Notes (Addendum)
 PROGRESS NOTE    Mathew Baker  ZOX:[id number]  DOB: Jul 30, 1941  DOA: 12/13/2023 PCP: Lauro Regulus, MD Outpatient Specialists:   Hospital course:  2/M w hypertension, chronic systolic CHF, severe AAS, paroxysmal A-fib on chronic anticoagulation, COPD, T2DM, CVA, recent staged PCI x 2 at [facility] where he was diagnosed with HFrEF EF 25 to 30% was admitted with worsening shortness of breath, orthopnea and melena.  Patient was treated with diuresis and PRBC transfusion with IV iron.    Subjective:  Patient is pleased his endoscopies over.  Is happy they did not find any ulcers.  Mitts to feeling somewhat tired.   Objective: Vitals:   12/20/23 1020 12/20/23 1045 12/20/23 1424 12/20/23 1524  BP: 115/71 122/66 (!) 106/90   Pulse: 80 75 90   Resp: 14 16 17 16   Temp:  98.1 F (36.7 C) 97.7 F (36.5 C) 97.8 F (36.6 C)  TempSrc:  Oral Oral Oral  SpO2: 98% 97% 97%   Weight:      Height:        Intake/Output Summary (Last 24 hours) at 12/20/2023 1728 Last data filed at 12/20/2023 1652 Gross per 24 hour  Intake 335.84 ml  Output 575 ml  Net -239.16 ml   Filed Weights   12/16/23 0440 12/19/23 0532 12/20/23 0432  Weight: 110.2 kg 108.9 kg 108.3 kg     Exam:  General: Patient lying in bed at 30 degrees looking somewhat tired Eyes: sclera anicteric, conjuctiva mild injection bilaterally CVS: S1-S2, regular  Respiratory:  decreased air entry bilaterally secondary to decreased inspiratory effort, rales at bases  GI: NABS, soft, NT  LE: Decreased muscle mass Neuro: A/O x 3,  grossly nonfocal.  Psych: patient is logical and coherent, judgement and insight appear normal, mood and affect appropriate to situation.  Data Reviewed:  Basic Metabolic Panel: Recent Labs  Lab 12/16/23 0315 12/17/23 0341 12/18/23 0258 12/19/23 0311 12/20/23 0301  NA 139 138 140 140 141  K 4.2 3.9 3.7 3.8 3.7  CL 106 103 104 103 104  CO2 25 28 29 29  32  GLUCOSE 90 204* 129* 213*  211*  BUN 25* 26* 26* 25* 23  CREATININE 0.95 1.07 0.97 1.12 1.06  CALCIUM 7.8* 7.7* 7.9* 7.9* 7.9*  PHOS  --   --   --   --  1.3*    CBC: Recent Labs  Lab 12/16/23 1232 12/17/23 0341 12/17/23 1952 12/18/23 0258 12/19/23 0311 12/20/23 0301  WBC 5.5 5.1  --  4.9 4.9 4.8  HGB 7.7* 7.5* 8.8* 8.6* 9.0* 8.9*  HCT 25.4* 25.0* 28.9* 28.5* 29.2* 29.1*  MCV 98.8 99.2  --  97.3 98.0 99.0  PLT 209 208  --  188 201 193     Scheduled Meds:  aspirin EC  81 mg Oral Daily   atorvastatin  80 mg Oral Daily   clopidogrel  75 mg Oral Daily   dextrose  1 ampule Intravenous Once   furosemide  60 mg Intravenous Daily   insulin aspart  0-15 Units Subcutaneous TID WC   insulin glargine-yfgn  6 Units Subcutaneous Daily   midodrine  10 mg Oral BID   pantoprazole  40 mg Oral BID   polyethylene glycol  17 g Oral Daily   sodium chloride flush  3 mL Intravenous Q12H   tamsulosin  0.4 mg Oral QHS   Continuous Infusions:  potassium PHOSPHATE IVPB (in mmol) 64.4 mL/hr at 12/20/23 1652     Assessment &  Plan:   Decompensated HFrEF Severe AS with episodes of syncope and orthostatic hypotension Hypotension requiring midodrine EF 25 to 30% with grade 1 DD Tolerating diuresis with good effect, on Lasix 60 mg daily GDMT limited by hypotension Patient has been told he is not a good candidate for TAVR Goals for care discussed given severe AS complicated by syncope  Melena--resolved, H&H stable Patient underwent EGD today which showed gastritis without active bleeding, biopsies taken, normal duodenum Patient has been transfused a total of 4 unit PRBC since admission however H&H have been stable for the last couple of days. Eliquis had been held, will restart given no bleeding on EGD  Hypophosphatemia Will replete and recheck tonight and tomorrow morning  Poorly controlled DM 2 Blood sugars remain elevated Increase glargine to 8 units Continue SSI high dose Last A1c was 9.1  CAD Elevated  troponins thought to be secondary to demand ischemia No further workup is planned Continue aspirin, Plavix and statin  Paroxysmal atrial fibrillation CHA2DS2-VASc 2 score is 7 Eliquis was being held but will restart today.  BPH Continue Flomax  GOC Appreciate palliative care involvement Patient remains DNR Will continue more aggressive management at present with EGD and referral to rehab Will continue to discuss poor prognosis given severe AS, decompensated heart failure, hypotension and intermittent syncope in setting of ongoing melena and GI bleed    DVT prophylaxis: SCD Code Status: DNR Family Communication: Daughter and granddaughters were at bedside throughout     Studies: No results found.  Principal Problem:   HFrEF (heart failure with reduced ejection fraction) (HCC) Active Problems:   Acute on chronic systolic (congestive) heart failure (HCC)   Nonrheumatic aortic valve stenosis   Elevated troponin   CAD S/P percutaneous coronary angioplasty   Symptomatic anemia   GI bleed   Paroxysmal atrial fibrillation (HCC)   Uncontrolled type 2 diabetes mellitus with hyperglycemia, with long-term current use of insulin (HCC)   Orthostatic hypotension   BPH (benign prostatic hyperplasia)   Tubular adenoma of colon   GERD (gastroesophageal reflux disease)   Ischemic cardiomyopathy   Demand ischemia (HCC)   Elevated troponin level not due myocardial infarction   Atrial fibrillation with controlled ventricular rate (HCC)   Near syncope   S/P coronary artery stent placement   Melena   Anemia due to chronic blood loss   Long term (current) use of antithrombotics/antiplatelets   Palliative care by specialist   Goals of care, counseling/discussion   DNR (do not resuscitate)     Magdalene School, [facility]  If 7PM-7AM, please contact night-coverage [email or web address]   LOS: 7 days

## 2023-12-20 NOTE — Anesthesia Postprocedure Evaluation (Signed)
 Anesthesia Post Note  Patient: Mathew Baker  Procedure(s) Performed: EGD (ESOPHAGOGASTRODUODENOSCOPY) BIOPSY     Patient location during evaluation: Endoscopy Anesthesia Type: MAC Level of consciousness: awake and alert Pain management: pain level controlled Vital Signs Assessment: post-procedure vital signs reviewed and stable Respiratory status: spontaneous breathing, nonlabored ventilation, respiratory function stable and patient connected to nasal cannula oxygen Cardiovascular status: blood pressure returned to baseline and stable Postop Assessment: no apparent nausea or vomiting Anesthetic complications: no  No notable events documented.  Last Vitals:  Vitals:   12/20/23 1020 12/20/23 1045  BP: 115/71 122/66  Pulse: 80 75  Resp: 14 16  Temp:  36.7 C  SpO2: 98% 97%    Last Pain:  Vitals:   12/20/23 1045  TempSrc: Oral  PainSc:                  [name]

## 2023-12-20 NOTE — Progress Notes (Signed)
 Physical Therapy Treatment Patient Details Name: Mathew Baker MRN: [id number] DOB: 09-13-1940 Today's Date: 12/20/2023   History of Present Illness Mathew Baker is a 83 y.o. male admitted 12/13/23 for SOB and syncope. 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 Of note, recent hospitalizations 3/18 for multivessel disease staged PCI and 3/31-4/3 after being found to have hematuria with acute blood loss on Eliquis.    PT Comments  Pt greeted seated in recliner chair, pleasant and agreeable to PT session. He was highly motivated and eager to walk. Pt advance gait, ambulating within the hallway using RW with CGA and a chair follow. He c/o mild dizziness and lightheadedness with a drop in BP noted following gait bouts. Pt had no overt LOB. He continues to display reduced activity tolerance and will benefit from continued inpatient follow up therapy, <3 hours/day.  Blood Pressure Start of Session (seated in recliner chair): 100/58 (71) Standing: 114/79 (91) Post first gait bout (seated in recliner chair): 103/61 (74) Post second gait bout (seated in recliner chair): 105/47 (63) End of Session (seated in recliner chair): 106/90 (96)     If plan is discharge home, recommend the following: A little help with bathing/dressing/bathroom;Assist for transportation;Assistance with cooking/housework;A lot of help with walking and/or transfers   Can travel by private vehicle     Yes  Equipment Recommendations  None recommended by PT    Recommendations for Other Services       Precautions / Restrictions Precautions Precautions: Fall Recall of Precautions/Restrictions: Intact Restrictions Weight Bearing Restrictions Per Provider Order: No     Mobility  Bed  Mobility               General bed mobility comments: Not assessed. Pt seated in recliner chair at start of session and returned there at end of session.    Transfers Overall transfer level: Needs assistance Equipment used: Rolling walker (2 wheels) Transfers: Sit to/from Stand Sit to Stand: Contact guard assist           General transfer comment: Pt stood from recliner chair. He scooted fwd until on the edge, increased fwd lean, and push up with one hand on armrest and one on RW. He powered up with CGA. Good eccentric control with sitting.    Ambulation/Gait Ambulation/Gait assistance: Contact guard assist, +2 safety/equipment (Chair Follow) Gait Distance (Feet): 60 Feet (1x60, seated rest, 1x60, seated rest 1x60) Assistive device: Rolling walker (2 wheels) Gait Pattern/deviations: Step-through pattern, Decreased stance time - left Gait velocity: reduced Gait velocity interpretation: <1.8 ft/sec, indicate of risk for recurrent falls   General Gait Details: Pt ambulated with a reciprocal gait pattern, even weight shift, good foot clearence, and upright posture. He initially maintained fixed gaze on floor, VC to look out, scan the environment in front of him, and occasionally glance down at his feet. Pt took intermittent seated rest breaks for recovery. He c/o mild dizziness/lightheadedness with mobility.   Stairs             Wheelchair Mobility     Tilt Bed    Modified Rankin (Stroke Patients Only)       Balance Overall balance assessment: Needs assistance Sitting-balance support: Feet supported, Single extremity supported Sitting balance-Leahy Scale: Good     Standing balance support: Bilateral upper extremity supported, During  functional activity, Reliant on assistive device for balance Standing balance-Leahy Scale: Poor Standing balance comment: Pt relies on RW during gait. No overt LOB, drifting, or staggering with mobility.                             Communication Communication Communication: No apparent difficulties  Cognition Arousal: Alert Behavior During Therapy: WFL for tasks assessed/performed   PT - Cognitive impairments: No apparent impairments                         Following commands: Intact      Cueing Cueing Techniques: Verbal cues  Exercises      General Comments General comments (skin integrity, edema, etc.): BP: start of session seated in recliner chair 100/58 (71), standing 114/79 (91), sitting following first gait bout 103/61 (74), sitting following second gait bout 105/47 (63), end of session seated in recliner chair 106/90 (96). Pt reported mild dizziness/lightheadedness that was manageable.      Pertinent Vitals/Pain Pain Assessment Pain Assessment: No/denies pain    Home Living                          Prior Function            PT Goals (current goals can now be found in the care plan section) Acute Rehab PT Goals Patient Stated Goal: Return Home Progress towards PT goals: Progressing toward goals    Frequency    Min 2X/week      PT Plan      Co-evaluation              AM-PAC PT "6 Clicks" Mobility   Outcome Measure  Help needed turning from your back to your side while in a flat bed without using bedrails?: A Little Help needed moving from lying on your back to sitting on the side of a flat bed without using bedrails?: A Little Help needed moving to and from a bed to a chair (including a wheelchair)?: A Little Help needed standing up from a chair using your arms (e.g., wheelchair or bedside chair)?: A Little Help needed to walk in hospital room?: Total Help needed climbing 3-5 steps with a railing? : Total 6 Click Score: 14    End of Session Equipment Utilized During Treatment: Gait belt Activity Tolerance: Patient tolerated treatment well Patient left: in chair;with call bell/phone within reach;with chair alarm set Nurse Communication:  Mobility status;Other (comment) (BP responses during session) PT Visit Diagnosis: Unsteadiness on feet (R26.81);Difficulty in walking, not elsewhere classified (R26.2);Other abnormalities of gait and mobility (R26.89)     Time: 1610-9604 PT Time Calculation (min) (ACUTE ONLY): 24 min  Charges:    $Gait Training: 23-37 mins PT General Charges $$ ACUTE PT VISIT: 1 Visit                     Glenford Lanes, PT, DPT Acute Rehabilitation Services Office: [phone] Secure Chat Preferred  [name] 12/20/2023, 3:11 PM

## 2023-12-20 NOTE — Plan of Care (Signed)
   Problem: Education: Goal: Knowledge of General Education information will improve Description Including pain rating scale, medication(s)/side effects and non-pharmacologic comfort measures Outcome: Progressing

## 2023-12-20 NOTE — Interval H&P Note (Signed)
 History and Physical Interval Note:  12/20/2023 8:12 AM  Mathew Baker  has presented today for surgery, with the diagnosis of Melena, anemia.  The various methods of treatment have been discussed with the patient and family. After consideration of risks, benefits and other options for treatment, the patient has consented to  Procedure(s): EGD (ESOPHAGOGASTRODUODENOSCOPY) (N/A) as a surgical intervention.  The patient's history has been reviewed, patient examined, no change in status, stable for surgery.  I have reviewed the patient's chart and labs.  Questions were answered to the patient's satisfaction.     Taraoluwa Thakur C Julizza Sassone

## 2023-12-20 NOTE — Progress Notes (Signed)
 PT Cancellation Note  Patient Details Name: Mathew Baker MRN: [id number] DOB: 20-Jul-1941   Cancelled Treatment:    Reason Eval/Treat Not Completed: Patient at procedure or test/unavailable (Pt off floor for EGD. Will follow-up as schedule permits.)  Glenford Lanes, PT, DPT Acute Rehabilitation Services Office: [phone] Secure Chat Preferred  [name] 12/20/2023, 8:14 AM

## 2023-12-20 NOTE — TOC Progression Note (Signed)
 Transition of Care [facility]) - Progression Note    Patient Details  Name: Mathew Baker MRN: [id number] Date of Birth: [birth year]  [facility] CM/SW Contact  Arron Big, [place] Phone Number: 12/20/2023, 11:40 AM  Clinical Narrative:   CSW followed up with [facility] regarding bed availability and potential DC tomorrow. [facility] patient tomorrow. Per Palliative, pt will need palliative following while at SNF. CSW informed [facility] and they will consult their palliative services for patient.   TOC will continue to follow.    Expected Discharge Plan: Skilled Nursing Facility Barriers to Discharge: Continued Medical Work up  Expected Discharge Plan and Services In-house Referral: Clinical Social Work     Living arrangements for the past 2 months: Single Family Home                             HH Agency: [facility] Date HH Agency Contacted: 12/08/23   Representative spoke with at [facility] Agency: Shaun   Social Determinants of Health (SDOH) Interventions SDOH Screenings   Food Insecurity: No Food Insecurity (12/13/2023)  Housing: Low Risk  (12/13/2023)  Transportation Needs: No Transportation Needs (12/13/2023)  Utilities: Not At Risk (12/13/2023)  Financial Resource Strain: Low Risk  (11/26/2023)   Received from [facility]  Social Connections: Moderately Isolated (12/13/2023)  Tobacco Use: Medium Risk (12/20/2023)    Readmission Risk Interventions     No data to display

## 2023-12-20 NOTE — Transfer of Care (Signed)
 Immediate Anesthesia Transfer of Care Note  Patient: Mathew Baker  Procedure(s) Performed: EGD (ESOPHAGOGASTRODUODENOSCOPY) BIOPSY  Patient Location: PACU and Endoscopy Unit  Anesthesia Type:MAC  Level of Consciousness: awake and alert   Airway & Oxygen Therapy: Patient Spontanous Breathing and Patient connected to nasal cannula oxygen  Post-op Assessment: Report given to RN and Post -op Vital signs reviewed and stable  Post vital signs: Reviewed and stable  Last Vitals:  Vitals Value Taken Time  BP 117/70 12/20/23 0951  Temp 36.4 C 12/20/23 0951  Pulse 74 12/20/23 0953  Resp 19 12/20/23 0953  SpO2 98 % 12/20/23 0953  Vitals shown include unfiled device data.  Last Pain:  Vitals:   12/20/23 0951  TempSrc: Temporal  PainSc: 0-No pain      Patients Stated Pain Goal: 0 (12/19/23 0016)  Complications: No notable events documented.

## 2023-12-20 NOTE — Progress Notes (Signed)
 PHARMACY - ANTICOAGULATION CONSULT NOTE  Pharmacy Consult for apixaban Indication: atrial fibrillation  No Known Allergies  Patient Measurements: Height: 6\' 1"  (185.4 cm) Weight: 108.3 kg (238 lb 12.1 oz) IBW/kg (Calculated) : 79.9 HEPARIN DW (KG): 99.8  Vital Signs: Temp: 97.8 F (36.6 C) (04/15 1524) Temp Source: Oral (04/15 1524) BP: 106/90 (04/15 1424) Pulse Rate: 90 (04/15 1424)  Labs: Recent Labs    12/18/23 0258 12/19/23 0311 12/20/23 0301  HGB 8.6* 9.0* 8.9*  HCT 28.5* 29.2* 29.1*  PLT 188 201 193  CREATININE 0.97 1.12 1.06    Estimated Creatinine Clearance: 68.2 mL/min (by C-G formula based on SCr of 1.06 mg/dL).   Medical History: Past Medical History:  Diagnosis Date   Chronic airway obstruction (HCC)    Degeneration of intervertebral disc of lumbar region    Diabetes mellitus without complication (HCC)    Essential hypertension, benign    Hyperlipidemia    Hypertension    Osteoarthritis of lower extremity    Pneumonia, organism unspecified(486)    PSA elevation 07/16/2013   Normal   Stroke [facility])     Assessment: 83 yo M with afib on chronic apixaban but apixaban was held during 3/31 -4/3 admission due to hematuria requiring blood transfusions. Unclear if it was resumed at discharge, not noted on 4/8 med rec. Patient with melena during this admission s/p EGD showing gastritis without active bleeding. Pharmacy consulted to resume apixaban.    CHADSVASc = 7. Does not meet criteria for dose adjustment but with multiple recent bleeding issues. Per discussion with MD, ok to resume 5mg  BID.   Goal of Therapy:  Monitor platelets by anticoagulation protocol: Yes   Plan:  Apixaban 5mg  BID  Monitor CBC, signs/symptoms of bleeding    Dorene Gang, PharmD, BCPS, BCCP Clinical Pharmacist  Please check AMION for all [facility] Pharmacy phone numbers After 10:00 PM, call Main Pharmacy [phone]

## 2023-12-20 NOTE — Op Note (Addendum)
 [facility] Patient Name: Mathew Baker Procedure Date : 12/20/2023 MRN: [id number] Attending MD: Particia Lather , , [id number] Date of Birth: November 03, 1940 CSN: [id number] Age: 83 Admit Type: Inpatient Procedure:                Upper GI endoscopy Indications:              Iron deficiency anemia, Melena Providers:                Madelyn Brunner" Robyn Haber, RN, Beryle Beams,                            Technician Referring MD:             Hospitalist team Medicines:                Monitored Anesthesia Care Complications:            No immediate complications. Estimated Blood Loss:     Estimated blood loss was minimal. Procedure:                Pre-Anesthesia Assessment:                           - Prior to the procedure, a History and Physical                            was performed, and patient medications and                            allergies were reviewed. The patient's tolerance of                            previous anesthesia was also reviewed. The risks                            and benefits of the procedure and the sedation                            options and risks were discussed with the patient.                            All questions were answered, and informed consent                            was obtained. Prior Anticoagulants: The patient has                            taken Plavix (clopidogrel), last dose was 1 day                            prior to procedure. ASA Grade Assessment: III - A                            patient with severe systemic disease. After  reviewing the risks and benefits, the patient was                            deemed in satisfactory condition to undergo the                            procedure.                           After obtaining informed consent, the endoscope was                            passed under direct vision. Throughout the                            procedure, the  patient's blood pressure, pulse, and                            oxygen saturations were monitored continuously. The                            [id number]-[id number] ([id number]) Olympus endoscope was introduced                            through the mouth, and advanced to the second part                            of duodenum. The upper GI endoscopy was                            accomplished without difficulty. The patient                            tolerated the procedure well. 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      A small hiatal hernia was present.      Localized inflammation characterized by congestion (edema) and erythema       was found in the gastric antrum. Biopsies were taken with a cold forceps       for histology.      The examined duodenum was normal. Impression:               - Non-obstructing Schatzki ring.                           - Small hiatal hernia.                           - Gastritis. Biopsied.                           - Normal examined duodenum. Recommendation:           - Return patient to hospital ward for ongoing care.                           -  No active bleeding seen on today's examination.                            Patient's Hb has stabilized.                           - Await pathology results.                           - Will ask the patinet's nurse to see what the                            patient's next stools look like to see if it is                            truly black.                           - I discussed the idea of a pill camera with the                            patient and the patient's daughter, though if small                            bowel AVMs were found, it would potentially involve                            requiring more procedures such as enteroscopy that                            would come with further risks from undergoing                            sedation.  Patient will take some time to consider                            this.                           - The findings and recommendations were discussed                            with the patient. Procedure Code(s):        --- Professional ---                           [phone], Esophagogastroduodenoscopy, flexible,                            transoral; with biopsy, single or multiple Diagnosis Code(s):        --- Professional ---                           K22.2, Esophageal obstruction  K44.9, Diaphragmatic hernia without obstruction or                            gangrene                           K29.70, Gastritis, unspecified, without bleeding                           D50.9, Iron deficiency anemia, unspecified                           K92.1, Melena (includes Hematochezia) CPT copyright 2022 [employer]. All rights reserved. The codes documented in this report are preliminary and upon coder review may  be revised to meet current compliance requirements. Dr Pedro Bourgeois "Anastacio Balm" Rosaline Coma,  12/20/2023 9:56:24 AM Number of Addenda: 0

## 2023-12-21 ENCOUNTER — Encounter: Payer: Self-pay | Admitting: Internal Medicine

## 2023-12-21 ENCOUNTER — Encounter (HOSPITAL_COMMUNITY): Payer: Self-pay | Admitting: Internal Medicine

## 2023-12-21 DIAGNOSIS — Z66 Do not resuscitate: Secondary | ICD-10-CM

## 2023-12-21 DIAGNOSIS — I2510 Atherosclerotic heart disease of native coronary artery without angina pectoris: Secondary | ICD-10-CM | POA: Diagnosis not present

## 2023-12-21 DIAGNOSIS — E1169 Type 2 diabetes mellitus with other specified complication: Secondary | ICD-10-CM

## 2023-12-21 DIAGNOSIS — I480 Paroxysmal atrial fibrillation: Secondary | ICD-10-CM | POA: Diagnosis not present

## 2023-12-21 DIAGNOSIS — I5023 Acute on chronic systolic (congestive) heart failure: Secondary | ICD-10-CM | POA: Diagnosis not present

## 2023-12-21 DIAGNOSIS — D649 Anemia, unspecified: Secondary | ICD-10-CM | POA: Diagnosis not present

## 2023-12-21 DIAGNOSIS — E66811 Obesity, class 1: Secondary | ICD-10-CM

## 2023-12-21 DIAGNOSIS — Z515 Encounter for palliative care: Secondary | ICD-10-CM | POA: Diagnosis not present

## 2023-12-21 DIAGNOSIS — Z7189 Other specified counseling: Secondary | ICD-10-CM | POA: Diagnosis not present

## 2023-12-21 DIAGNOSIS — E785 Hyperlipidemia, unspecified: Secondary | ICD-10-CM

## 2023-12-21 LAB — CBC
HCT: 29.7 % — ABNORMAL LOW (ref 39.0–52.0)
Hemoglobin: 9.1 g/dL — ABNORMAL LOW (ref 13.0–17.0)
MCH: 30.2 pg (ref 26.0–34.0)
MCHC: 30.6 g/dL (ref 30.0–36.0)
MCV: 98.7 fL (ref 80.0–100.0)
Platelets: 191 10*3/uL (ref 150–400)
RBC: 3.01 MIL/uL — ABNORMAL LOW (ref 4.22–5.81)
RDW: 25.4 % — ABNORMAL HIGH (ref 11.5–15.5)
WBC: 4.9 10*3/uL (ref 4.0–10.5)
nRBC: 0 % (ref 0.0–0.2)

## 2023-12-21 LAB — RENAL FUNCTION PANEL
Albumin: 2.7 g/dL — ABNORMAL LOW (ref 3.5–5.0)
Anion gap: 6 (ref 5–15)
BUN: 25 mg/dL — ABNORMAL HIGH (ref 8–23)
CO2: 29 mmol/L (ref 22–32)
Calcium: 7.8 mg/dL — ABNORMAL LOW (ref 8.9–10.3)
Chloride: 103 mmol/L (ref 98–111)
Creatinine, Ser: 1.19 mg/dL (ref 0.61–1.24)
GFR, Estimated: >60 mL/min (ref >60)
Glucose, Bld: 281 mg/dL — ABNORMAL HIGH (ref 70–99)
Phosphorus: 2 mg/dL — ABNORMAL LOW (ref 2.5–4.6)
Potassium: 4.1 mmol/L (ref 3.5–5.1)
Sodium: 138 mmol/L (ref 135–145)

## 2023-12-21 LAB — GLUCOSE, CAPILLARY
Glucose-Capillary: 233 mg/dL — ABNORMAL HIGH (ref 70–99)
Glucose-Capillary: 254 mg/dL — ABNORMAL HIGH (ref 70–99)

## 2023-12-21 LAB — SURGICAL PATHOLOGY

## 2023-12-21 MED ORDER — FUROSEMIDE 40 MG PO TABS: 40.0000 mg | ORAL_TABLET | Freq: Every day | ORAL | Status: DC

## 2023-12-21 MED ORDER — PANTOPRAZOLE SODIUM 40 MG PO TBEC: 40.0000 mg | DELAYED_RELEASE_TABLET | Freq: Two times a day (BID) | ORAL | 0 refills | Status: DC

## 2023-12-21 MED ORDER — APIXABAN 5 MG PO TABS: 5.0000 mg | ORAL_TABLET | Freq: Two times a day (BID) | ORAL | 0 refills | Status: DC

## 2023-12-21 MED ORDER — FUROSEMIDE 40 MG PO TABS: 40.0000 mg | ORAL_TABLET | Freq: Every day | ORAL | 0 refills | Status: DC

## 2023-12-21 MED ORDER — ACETAMINOPHEN 325 MG PO TABS: 650.0000 mg | ORAL_TABLET | Freq: Four times a day (QID) | ORAL | Status: DC | PRN

## 2023-12-21 MED ORDER — INSULIN GLARGINE-YFGN 100 UNIT/ML ~~LOC~~ SOLN: 8.0000 [IU] | Freq: Every day | SUBCUTANEOUS | 11 refills | Status: DC

## 2023-12-21 MED ORDER — MIDODRINE HCL 10 MG PO TABS: 10.0000 mg | ORAL_TABLET | Freq: Two times a day (BID) | ORAL | 0 refills | Status: DC

## 2023-12-21 MED ORDER — INSULIN ASPART 100 UNIT/ML IJ SOLN
0.0000 [IU] | Freq: Three times a day (TID) | INTRAMUSCULAR | 11 refills | Status: DC
Start: 2023-12-21 — End: 2023-12-27

## 2023-12-21 MED ORDER — ASPIRIN 81 MG PO TBEC: 81.0000 mg | DELAYED_RELEASE_TABLET | Freq: Every day | ORAL | 0 refills | Status: DC

## 2023-12-21 NOTE — Assessment & Plan Note (Addendum)
 Acute on chronic symptomatic anemia Acute upper GI bleed with acute blood loss anemia.   Patient with symptomatic anemia, and required 4 units PRBC transfusion 04/15 EGD with non obstructing Schatzki ring, small hiatal hernia, gastritis (biopsied), normal duodenum.   It was discussed with family option of capsule endoscopy for further work up, though if small bowel AVMs were found, it would potentially involve requiring more procedures such as enteroscopy that would come with further risks from underlying sedation.   Decision was made by his family to continue current medical therapy and follow up as outpatient.  No further procedures at this time.   Patient was resumed on anticoagulation and his discharge hgb is 9.1 with platelet 191.

## 2023-12-21 NOTE — Progress Notes (Signed)
 Report called to Shelvy Dickens, LPN at [facility].

## 2023-12-21 NOTE — Assessment & Plan Note (Signed)
 Patient was placed on insulin sliding scale for glucose cover and moniotirng Basal insulin with 8 units His glucose remained stable.

## 2023-12-21 NOTE — Discharge Summary (Addendum)
 Physician Discharge Summary   Patient: Mathew Baker MRN: [id number] DOB: 30-Oct-1940  Admit date:     12/13/2023  Discharge date: 12/21/23  Discharge Physician: Curlee Doss Anna Beaird   PCP: Jimmy Moulding, MD   Recommendations at discharge:    Patient will continue pantoprazole 40 mg bid In terms of antiplatelet therapy plan is to continue aspirin until 12/29/23, then continue clopidogrel alone. Uninterrupted apixaban for atrial fibrillation anticoagulation.  Discontinue carvedilol due to risk of hypotension. Continue blood pressure support with midodrine and diuresis with furosemide and SGLT 2 inh Follow up renal function and electrolytes in 7 days Follow up with Dr Alva Jewels in 7 to 10 days Follow up with Cardiology as scheduled, and with [facility] for aortic stenosis.   I spoke with patient's daughter over the phone , we talked in detail about patient's condition, plan of care and all questions were addressed.   Discharge Diagnoses: Principal Problem:   Acute on chronic systolic (congestive) heart failure (HCC) Active Problems:   CAD S/P percutaneous coronary angioplasty   Paroxysmal atrial fibrillation (HCC)   GI bleed   Uncontrolled type 2 diabetes mellitus with hyperglycemia, with long-term current use of insulin (HCC)   BPH (benign prostatic hyperplasia)   Tubular adenoma of colon   GERD (gastroesophageal reflux disease)   Ischemic cardiomyopathy   Demand ischemia (HCC)   Elevated troponin level not due myocardial infarction   Atrial fibrillation with controlled ventricular rate (HCC)   Near syncope   S/P coronary artery stent placement   Melena   Anemia due to chronic blood loss   Long term (current) use of antithrombotics/antiplatelets   Palliative care by specialist   Goals of care, counseling/discussion   DNR (do not resuscitate)  Resolved Problems:   * No resolved hospital problems. [facility] Course: [name] was admitted to the hospital with  the working diagnosis of acute on chronic heart failure exacerbation.   83 yo male with the past medical history of hypertension, hyperlipidemia, heart failure, aortic stenosis, paroxysmal atrial fibrillation, T2DM , history of CVA, COPD, and hepatocellular carcinoma who presented with dyspnea.  11/22/23 he was transferred from [facility] to [facility] due coronary artery disease, multivessel staged PCI.  03/31 to 12/08/23 hospitalized at [facility] for hematuria, with acute blood loss anemia. He required 2 units PRBC transfusion.  For the last 2 to 3 days prior to current hospitalization patient had worsening dyspnea, on exertion along with cough and foamy sputum, prompting him to come to the hospital. Positive melena.  On his initial physical examination his blood pressure was 104/59, HR 82, RR 21 and 02 saturation 98%, lungs with no wheezing or rhonchi, heart with S1 and S2 present irregularly irregular with no gallops or rubs, abdomen with no distention and positive + pitting bilateral lower extremity edema.  Na 139 K 3,9 Cl 107 bicarbonate 24 glucose 62, bun 29 cr 1,0  BNP 549  High sensitive troponin 530 and 497  Wbc 6,0 hgb 7,0 plt 220   EKG 81 bpm, left axis deviation, left bundle branch block, left anterior fascicular block, qtc 447, atrial fibrillation rhythm with no significant ST segment or  T wave changes.   Patient was placed on IV furosemide for diuresis with good toleration.  Her required 4 units PRBC transfusion.  Underwent further work up with EGD showing gastritis.  Patient was resumed with antiplatelet therapy with good toleration,   Patient very weak and deconditioned, decision was made to transfer to SNF to  continue physical therapy.       Assessment and Plan: * Acute on chronic systolic (congestive) heart failure (HCC) Echocardiogram with reduced LV systolic function with EF 25 to 30%, global hypokinesis, mild LVH, RV systolic function preserved, RVSP 32.5 mmHg, LA with moderate  dilatation, mild mitral valve regurgitation, severe aortic stenosis, with features of low flow, aortic valve regurgitation is mild to moderate, no pericardial effusion.   Patient was placed on furosemide for diuresis with good toleration, negative fluid balance was achieved with improvement in his symptoms.  Limited pharmacologic therapy due to hypotension.  Continue midodrine for blood pressure support.  Will resume SGLT 2 inh at the time of discharge.   Patient will follow up with [facility] for aortic valve, consideration for SAVR.   CAD S/P percutaneous coronary angioplasty Acute coronary syndrome with multivessel PCI Plan to continue dual antiplatelet therapy until 12/29/23 and then continue with clopidogrel alone. Continue with statin.    Paroxysmal atrial fibrillation (HCC) His hear rate has been controlled, will continue anticoagulation with apixaban.  Follow up as outpatient.   Normocytic anemia Acute on chronic symptomatic anemia Acute upper GI bleed with acute blood loss anemia.   Patient with symptomatic anemia, and required 4 units PRBC transfusion 04/15 EGD with non obstructing Schatzki ring, small hiatal hernia, gastritis (biopsied), normal duodenum.   It was discussed with family option of capsule endoscopy for further work up, though if small bowel AVMs were found, it would potentially involve requiring more procedures such as enteroscopy that would come with further risks from underlying sedation.   Decision was made by his family to continue current medical therapy and follow up as outpatient.  No further procedures at this time.   Patient was resumed on anticoagulation and his discharge hgb is 9.1 with platelet 191.   Type 2 diabetes mellitus with hyperlipidemia (HCC) Patient was placed on insulin sliding scale for glucose cover and moniotirng Basal insulin with 8 units His glucose remained stable.   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           Consultants: Cardiology and GI  Procedures performed: endoscopy   Disposition: Skilled nursing facility Diet recommendation:  Cardiac diet DISCHARGE MEDICATION: Allergies as of 12/21/2023   No Known Allergies      Medication List     STOP taking these medications    carvedilol 3.125 MG tablet Commonly known as: COREG   insulin aspart 100 UNIT/ML injection Commonly known as: novoLOG Replaced by: insulin aspart 100 UNIT/ML injection   Lantus 100 UNIT/ML injection Generic drug: insulin glargine       TAKE these medications    acetaminophen 325 MG tablet Commonly known as: TYLENOL Take 2 tablets (650 mg total) by mouth every 6 (six) hours as needed for mild pain (pain score 1-3) or moderate pain (pain score 4-6) (or Fever >/= 101).   Alcohol Wipes 70 % Pads USE FOR INSULIN INJECTIONS OR TO FINGER STICK AS DIRECTED BY YOUR MEDICAL PROVIDER   apixaban 5 MG Tabs tablet Commonly known as: ELIQUIS Take 1 tablet (5 mg total) by mouth 2 (two) times daily.   aspirin EC 81 MG tablet Take 1 tablet (81 mg total) by mouth daily for 7 days. Swallow whole. Start taking on: December 22, 2023   atorvastatin 80 MG tablet Commonly known as: LIPITOR Take 80 mg by mouth daily.   clopidogrel 75 MG tablet Commonly known as: PLAVIX Take 75 mg by mouth daily.   docusate sodium  100 MG capsule Commonly known as: COLACE Take 100 mg by mouth 2 (two) times daily.   empagliflozin 25 MG Tabs tablet Commonly known as: JARDIANCE Take 12.5 mg by mouth daily.   feeding supplement Liqd Take 237 mLs by mouth 3 (three) times daily between meals.   furosemide 40 MG tablet Commonly known as: LASIX Take 1 tablet (40 mg total) by mouth daily.   insulin aspart 100 UNIT/ML injection Commonly known as: novoLOG Inject 0-15 Units into the skin 3 (three) times daily with meals. Replaces: insulin aspart 100 UNIT/ML injection   insulin glargine-yfgn 100 UNIT/ML injection Commonly known as:  SEMGLEE Inject 0.08 mLs (8 Units total) into the skin daily. Start taking on: December 22, 2023   ipratropium-albuterol 0.5-2.5 (3) MG/3ML Soln Commonly known as: DUONEB Take 3 mLs by nebulization every 4 (four) hours.   latanoprost 0.005 % ophthalmic solution Commonly known as: XALATAN Place 1 drop into both eyes at bedtime.   midodrine 10 MG tablet Commonly known as: PROAMATINE Take 1 tablet (10 mg total) by mouth 2 (two) times daily. What changed:  medication strength how much to take   nitroGLYCERIN 0.4 MG SL tablet Commonly known as: NITROSTAT Place 0.4 mg under the tongue every 5 (five) minutes as needed for chest pain.   ondansetron 4 MG/2ML Soln injection Commonly known as: ZOFRAN Inject 2 mLs (4 mg total) into the vein every 6 (six) hours as needed for nausea or vomiting.   pantoprazole 40 MG tablet Commonly known as: PROTONIX Take 1 tablet (40 mg total) by mouth 2 (two) times daily. What changed: when to take this   PEN NEEDLES 29GX1/2" 29G X Misc Use 1x a day   polyethylene glycol 17 g packet Commonly known as: MIRALAX / GLYCOLAX Take 17 g by mouth daily as needed for moderate constipation.   Precision QID Test test strip Generic drug: glucose blood Use to test blood sugar 2 times daily as instructed. Dx: E11.65   tamsulosin 0.4 MG Caps capsule Commonly known as: FLOMAX Take 0.4 mg by mouth at bedtime.   vitamin B-12 100 MCG tablet Commonly known as: CYANOCOBALAMIN Take 100 mcg by mouth daily.        Contact information for after-discharge care     Destination     [facility] Preferred SNF .   Service: Skilled Nursing Contact information: [address] [place]   [address] [phone]                    Discharge Exam: Mathew Baker Weights   12/19/23 0532 12/20/23 0432 12/21/23 0412  Weight: 108.9 kg 108.3 kg 109.4 kg   BP (!) 107/56 (BP  Location: Left Arm)   Pulse 82   Temp 97.7 F (36.5 C) (Oral)   Resp 18   Ht 6\' 1"  (1.854 m)   Wt 109.4 kg   SpO2 99%   BMI 31.82 kg/m   Patient is feeling better, his dyspnea and edema have improved, patient had no hematuria or melena. No chest pain   Neurology awake and alert ENT with no pallor or icterus Cardiovascular with S1 and S2 present irregularly irregular, with no gallop or rubs, positive systolic murmur at the base Respiratory with no rales or wheezing, no rhonchi  Abdomen with no distention  Chronic right lower extremity edema   Condition at discharge: stable  The results of significant diagnostics from this hospitalization (including imaging, microbiology,  ancillary and laboratory) are listed below for reference.   Imaging Studies: DG Chest 2 View Result Date: 12/13/2023 CLINICAL DATA:  dyspnea EXAM: CHEST - 2 VIEW COMPARISON:  11/20/2023 FINDINGS: New small to moderate right pleural effusion. New airspace disease at the right lung base. Some improved left retrocardiac aeration. Heart size and mediastinal contours are within normal limits. Aortic Atherosclerosis (ICD10-170.0). Visualized bones unremarkable. IMPRESSION: New small to moderate right pleural effusion and right basilar airspace disease. Electronically Signed   By: Corlis Leak M.D.   On: 12/13/2023 09:02   US Venous Img Lower Bilateral Result Date: 12/06/2023 CLINICAL DATA:  Lower extremity swelling EXAM: BILATERAL LOWER EXTREMITY VENOUS DOPPLER ULTRASOUND TECHNIQUE: Gray-scale sonography with compression, as well as color and duplex ultrasound, were performed to evaluate the deep venous system(s) from the level of the common femoral vein through the popliteal and proximal calf veins. COMPARISON:  None Available. FINDINGS: VENOUS Normal compressibility of the common femoral, superficial femoral, and popliteal veins, as well as the visualized calf veins. Visualized portions of profunda femoral vein and great  saphenous vein unremarkable. No filling defects to suggest DVT on grayscale or color Doppler imaging. Doppler waveforms show normal direction of venous flow, normal respiratory plasticity and response to augmentation. Limited views of the contralateral common femoral vein are unremarkable. OTHER None. Limitations: none IMPRESSION: Negative. Electronically Signed   By: Deatra Robinson M.D.   On: 12/06/2023 02:45   CT Angio Chest PE W/Cm &/Or Wo Cm Result Date: 12/06/2023 CLINICAL DATA:  Fall, blunt chest trauma, lower abdominal ecchymosis, hematuria EXAM: CT ANGIOGRAPHY CHEST CT ABDOMEN AND PELVIS WITH CONTRAST TECHNIQUE: Multidetector CT imaging of the chest was performed using the standard protocol during bolus administration of intravenous contrast. Multiplanar CT image reconstructions and MIPs were obtained to evaluate the vascular anatomy. Multidetector CT imaging of the abdomen and pelvis was performed using the standard protocol during bolus administration of intravenous contrast. RADIATION DOSE REDUCTION: This exam was performed according to the departmental dose-optimization program which includes automated exposure control, adjustment of the mA and/or kV according to patient size and/or use of iterative reconstruction technique. CONTRAST:  OMNIPAQUE IOHEXOL 350 MG/ML SOLN COMPARISON:  11/18/2023 FINDINGS: CTA CHEST FINDINGS Cardiovascular: There is adequate opacification of the pulmonary arterial tree. No intraluminal filling defect identified to suggest acute pulmonary embolism. The central pulmonary arteries are of normal caliber. There is extensive multi-vessel coronary artery calcification. Advanced calcification of the aortic valve leaflets. Global cardiac size is within normal limits. No pericardial effusion. Moderate atherosclerotic calcification within the thoracic aorta. No aortic aneurysm. Mediastinum/Nodes: No enlarged mediastinal, hilar, or axillary lymph nodes. Thyroid gland, trachea, and  esophagus demonstrate no significant findings. Lungs/Pleura: Moderate right and small left pleural effusions are again identified, slightly enlarged on the right with compressive atelectasis of the lungs dependently. There is progressive subtotal collapse of the right lower lobe. No pneumothorax. No central obstructing lesion Musculoskeletal: Osseous structures are age appropriate. No acute bone abnormality. No lytic or blastic bone lesion. Review of the MIP images confirms the above findings. CT ABDOMEN and PELVIS FINDINGS Hepatobiliary: 25 mm hypodense lesions seen within left hepatic dome, similar to prior examination, but indeterminate. The liver is otherwise unremarkable. No intra or extrahepatic biliary ductal dilation. Status post cholecystectomy. Pancreas: Unremarkable Spleen: Unremarkable Adrenals/Urinary Tract: Adrenal glands are unremarkable. The kidneys are normal in size and position. Mild right renal cortical scarring. Stable moderate bilateral nonspecific perinephric edema. No hydronephrosis. No intrarenal or ureteral calculi. The bladder is  decompressed with a Foley catheter balloon seen within its lumen. Stomach/Bowel: Mild sigmoid diverticulosis. Stomach, small bowel, and large bowel are otherwise unremarkable. Appendix normal. No evidence of obstruction or focal inflammation. No free intraperitoneal gas or fluid. Vascular/Lymphatic: Extensive aortoiliac atherosclerotic calcification with particularly atherosclerotic calcification at the origin of the mesenteric and renal arterial vasculature no pathologic adenopathy within the abdomen and pelvis. Reproductive: More prostatic hypertrophy. Other: Moderate diffuse subcutaneous body wall edema. No abdominal hernia. Musculoskeletal: No acute bone abnormality. No lytic or blastic lesion. Review of the MIP images confirms the above findings. IMPRESSION: 1. No pulmonary embolism. No acute intrathoracic or intra-abdominal pathology identified. 2. Moderate  right and small left pleural effusions, slightly enlarged on the right with progressive subtotal collapse of the right lower lobe. 3. 25 mm hypodense lesion within the left hepatic dome, similar to prior examination, but indeterminate. This could be better assessed with hepatic protocol MRI examination when clinically appropriate. 4. Mild sigmoid diverticulosis. 5. Moderate diffuse subcutaneous body wall edema. Aortic Atherosclerosis (ICD10-I70.0). Electronically Signed   By: Worthy Heads M.D.   On: 12/06/2023 01:36   CT ABDOMEN PELVIS W CONTRAST Result Date: 12/06/2023 CLINICAL DATA:  Fall, blunt chest trauma, lower abdominal ecchymosis, hematuria EXAM: CT ANGIOGRAPHY CHEST CT ABDOMEN AND PELVIS WITH CONTRAST TECHNIQUE: Multidetector CT imaging of the chest was performed using the standard protocol during bolus administration of intravenous contrast. Multiplanar CT image reconstructions and MIPs were obtained to evaluate the vascular anatomy. Multidetector CT imaging of the abdomen and pelvis was performed using the standard protocol during bolus administration of intravenous contrast. RADIATION DOSE REDUCTION: This exam was performed according to the departmental dose-optimization program which includes automated exposure control, adjustment of the mA and/or kV according to patient size and/or use of iterative reconstruction technique. CONTRAST:  OMNIPAQUE IOHEXOL 350 MG/ML SOLN COMPARISON:  11/18/2023 FINDINGS: CTA CHEST FINDINGS Cardiovascular: There is adequate opacification of the pulmonary arterial tree. No intraluminal filling defect identified to suggest acute pulmonary embolism. The central pulmonary arteries are of normal caliber. There is extensive multi-vessel coronary artery calcification. Advanced calcification of the aortic valve leaflets. Global cardiac size is within normal limits. No pericardial effusion. Moderate atherosclerotic calcification within the thoracic aorta. No aortic  aneurysm. Mediastinum/Nodes: No enlarged mediastinal, hilar, or axillary lymph nodes. Thyroid gland, trachea, and esophagus demonstrate no significant findings. Lungs/Pleura: Moderate right and small left pleural effusions are again identified, slightly enlarged on the right with compressive atelectasis of the lungs dependently. There is progressive subtotal collapse of the right lower lobe. No pneumothorax. No central obstructing lesion Musculoskeletal: Osseous structures are age appropriate. No acute bone abnormality. No lytic or blastic bone lesion. Review of the MIP images confirms the above findings. CT ABDOMEN and PELVIS FINDINGS Hepatobiliary: 25 mm hypodense lesions seen within left hepatic dome, similar to prior examination, but indeterminate. The liver is otherwise unremarkable. No intra or extrahepatic biliary ductal dilation. Status post cholecystectomy. Pancreas: Unremarkable Spleen: Unremarkable Adrenals/Urinary Tract: Adrenal glands are unremarkable. The kidneys are normal in size and position. Mild right renal cortical scarring. Stable moderate bilateral nonspecific perinephric edema. No hydronephrosis. No intrarenal or ureteral calculi. The bladder is decompressed with a Foley catheter balloon seen within its lumen. Stomach/Bowel: Mild sigmoid diverticulosis. Stomach, small bowel, and large bowel are otherwise unremarkable. Appendix normal. No evidence of obstruction or focal inflammation. No free intraperitoneal gas or fluid. Vascular/Lymphatic: Extensive aortoiliac atherosclerotic calcification with particularly atherosclerotic calcification at the origin of the mesenteric and renal arterial vasculature no  pathologic adenopathy within the abdomen and pelvis. Reproductive: More prostatic hypertrophy. Other: Moderate diffuse subcutaneous body wall edema. No abdominal hernia. Musculoskeletal: No acute bone abnormality. No lytic or blastic lesion. Review of the MIP images confirms the above findings.  IMPRESSION: 1. No pulmonary embolism. No acute intrathoracic or intra-abdominal pathology identified. 2. Moderate right and small left pleural effusions, slightly enlarged on the right with progressive subtotal collapse of the right lower lobe. 3. 25 mm hypodense lesion within the left hepatic dome, similar to prior examination, but indeterminate. This could be better assessed with hepatic protocol MRI examination when clinically appropriate. 4. Mild sigmoid diverticulosis. 5. Moderate diffuse subcutaneous body wall edema. Aortic Atherosclerosis (ICD10-I70.0). Electronically Signed   By: Worthy Heads M.D.   On: 12/06/2023 01:36   CT Head Wo Contrast Result Date: 12/06/2023 CLINICAL DATA:  Syncopal episode with head trauma. On blood thinner. Neck trauma. EXAM: CT HEAD WITHOUT CONTRAST CT CERVICAL SPINE WITHOUT CONTRAST TECHNIQUE: Multidetector CT imaging of the head and cervical spine was performed following the standard protocol without intravenous contrast. Multiplanar CT image reconstructions of the cervical spine were also generated. RADIATION DOSE REDUCTION: This exam was performed according to the departmental dose-optimization program which includes automated exposure control, adjustment of the mA and/or kV according to patient size and/or use of iterative reconstruction technique. COMPARISON:  CT head and cervical spine 09/30/2022 FINDINGS: CT HEAD FINDINGS Brain: No intracranial hemorrhage, mass effect, or evidence of acute infarct. No hydrocephalus. No extra-axial fluid collection. Age related cerebral atrophy and chronic small vessel ischemic disease. Chronic bilateral cerebellar infarcts. Chronic lacunar infarct in the right subinsular cortex. Vascular: No hyperdense vessel. Intracranial arterial calcification. Skull: No fracture or focal lesion. Sinuses/Orbits: No acute finding. Other: None. CT CERVICAL SPINE FINDINGS Alignment: No evidence of traumatic malalignment. Leftward rotation of the axis  around the dens is favored positional. Skull base and vertebrae: No acute fracture. No primary bone lesion or focal pathologic process. Soft tissues and spinal canal: No prevertebral fluid or swelling. No visible canal hematoma. Disc levels: Multilevel advanced spondylosis with bulky bridging anterior osteophytes, disc space height loss and degenerative endplate changes greatest at C5-C6. Multilevel mild to moderate effacement of the ventral thecal sac due to posterior disc osteophyte complexes greatest at C5-C6 and C6-C7. No severe spinal canal narrowing. Multilevel facet arthropathy. Partially Upper chest: Partially visualized large bilateral pleural effusions. Other: Carotid calcification. IMPRESSION: 1. No acute intracranial abnormality. 2. No acute fracture in the cervical spine. 3. Partially visualized large bilateral pleural effusions. Electronically Signed   By: Rozell Cornet M.D.   On: 12/06/2023 01:32   CT Cervical Spine Wo Contrast Result Date: 12/06/2023 CLINICAL DATA:  Syncopal episode with head trauma. On blood thinner. Neck trauma. EXAM: CT HEAD WITHOUT CONTRAST CT CERVICAL SPINE WITHOUT CONTRAST TECHNIQUE: Multidetector CT imaging of the head and cervical spine was performed following the standard protocol without intravenous contrast. Multiplanar CT image reconstructions of the cervical spine were also generated. RADIATION DOSE REDUCTION: This exam was performed according to the departmental dose-optimization program which includes automated exposure control, adjustment of the mA and/or kV according to patient size and/or use of iterative reconstruction technique. COMPARISON:  CT head and cervical spine 09/30/2022 FINDINGS: CT HEAD FINDINGS Brain: No intracranial hemorrhage, mass effect, or evidence of acute infarct. No hydrocephalus. No extra-axial fluid collection. Age related cerebral atrophy and chronic small vessel ischemic disease. Chronic bilateral cerebellar infarcts. Chronic lacunar  infarct in the right subinsular cortex. Vascular: No hyperdense vessel. Intracranial  arterial calcification. Skull: No fracture or focal lesion. Sinuses/Orbits: No acute finding. Other: None. CT CERVICAL SPINE FINDINGS Alignment: No evidence of traumatic malalignment. Leftward rotation of the axis around the dens is favored positional. Skull base and vertebrae: No acute fracture. No primary bone lesion or focal pathologic process. Soft tissues and spinal canal: No prevertebral fluid or swelling. No visible canal hematoma. Disc levels: Multilevel advanced spondylosis with bulky bridging anterior osteophytes, disc space height loss and degenerative endplate changes greatest at C5-C6. Multilevel mild to moderate effacement of the ventral thecal sac due to posterior disc osteophyte complexes greatest at C5-C6 and C6-C7. No severe spinal canal narrowing. Multilevel facet arthropathy. Partially Upper chest: Partially visualized large bilateral pleural effusions. Other: Carotid calcification. IMPRESSION: 1. No acute intracranial abnormality. 2. No acute fracture in the cervical spine. 3. Partially visualized large bilateral pleural effusions. Electronically Signed   By: Rozell Cornet M.D.   On: 12/06/2023 01:32    Microbiology: Results for orders placed or performed during the hospital encounter of 12/05/23  Urine Culture     Status: None   Collection Time: 12/06/23  4:28 AM   Specimen: Urine, Random  Result Value Ref Range Status   Specimen Description   Final    URINE, RANDOM Performed at [facility], [address] [place], [place] [address]    Special Requests   Final    NONE Reflexed from [id number] Performed at [facility], [address], [place], [place] [address]    Culture   Final    NO GROWTH Performed at [facility], [address], [place], [place] [address]    Report Status 12/08/2023 FINAL  Final    Labs: CBC: Recent Labs  Lab 12/17/23 0341  12/17/23 1952 12/18/23 0258 12/19/23 0311 12/20/23 0301 12/21/23 0300  WBC 5.1  --  4.9 4.9 4.8 4.9  HGB 7.5* 8.8* 8.6* 9.0* 8.9* 9.1*  HCT 25.0* 28.9* 28.5* 29.2* 29.1* 29.7*  MCV 99.2  --  97.3 98.0 99.0 98.7  PLT 208  --  188 201 193 191   Basic Metabolic Panel: Recent Labs  Lab 12/17/23 0341 12/18/23 0258 12/19/23 0311 12/20/23 0301 12/20/23 2046 12/21/23 0300  NA 138 140 140 141  --  138  K 3.9 3.7 3.8 3.7  --  4.1  CL 103 104 103 104  --  103  CO2 28 29 29  32  --  29  GLUCOSE 204* 129* 213* 211*  --  281*  BUN 26* 26* 25* 23  --  25*  CREATININE 1.07 0.97 1.12 1.06  --  1.19  CALCIUM 7.7* 7.9* 7.9* 7.9*  --  7.8*  PHOS  --   --   --  1.3* 2.7 2.0*   Liver Function Tests: Recent Labs  Lab 12/20/23 0301 12/21/23 0300  ALBUMIN 2.6* 2.7*   CBG: Recent Labs  Lab 12/20/23 0810 12/20/23 1107 12/20/23 1517 12/20/23 2215 12/21/23 0602  GLUCAP 161* 174* 277* 292* 233*    Discharge time spent: greater than 30 minutes.  Signed: Albertus Alt, MD [facility] 12/21/2023

## 2023-12-21 NOTE — Discharge Instructions (Signed)

## 2023-12-21 NOTE — Progress Notes (Signed)
 Progress Note  Patient Name: Mathew Baker Date of Encounter: 12/21/2023 Primary Cardiologist: Kateri Mc: [facility] [place]  Subjective   Did very well with GI.  Was cleared for triple therapy and started by [facility] 12/20/23.  Notes light brown stool only.   Vital Signs    Vitals:   12/20/23 1927 12/21/23 0019 12/21/23 0412 12/21/23 0816  BP: (!) 114/94 98/67 102/61 108/63  Pulse: 80 63 74 83  Resp: 17 18 18 18   Temp: 98.1 F (36.7 C) 97.6 F (36.4 C) 97.7 F (36.5 C) 97.8 F (36.6 C)  TempSrc: Oral Oral Oral Oral  SpO2: 98% 97% 100%   Weight:   109.4 kg   Height:        Intake/Output Summary (Last 24 hours) at 12/21/2023 1052 Last data filed at 12/21/2023 0900 Gross per 24 hour  Intake 975.84 ml  Output 950 ml  Net 25.84 ml   Filed Weights   12/19/23 0532 12/20/23 0432 12/21/23 0412  Weight: 108.9 kg 108.3 kg 109.4 kg    Physical Exam   GEN: No acute distress.   Cardiac: IRRR, no rubs, or gallops. Systolic crescendo murmur, Absent A2, distal radial pulses diminished Respiratory: Clear to auscultation bilaterally. GI: Soft, nontender, non-distended  MS: No edema  Labs   Telemetry: AF rate controlled   Chemistry Recent Labs  Lab 12/19/23 0311 12/20/23 0301 12/21/23 0300  NA 140 141 138  K 3.8 3.7 4.1  CL 103 104 103  CO2 29 32 29  GLUCOSE 213* 211* 281*  BUN 25* 23 25*  CREATININE 1.12 1.06 1.19  CALCIUM 7.9* 7.9* 7.8*  ALBUMIN  --  2.6* 2.7*  GFRNONAA >60 >60 >60  ANIONGAP 8 5 6      Hematology Recent Labs  Lab 12/19/23 0311 12/20/23 0301 12/21/23 0300  WBC 4.9 4.8 4.9  RBC 2.98* 2.94* 3.01*  HGB 9.0* 8.9* 9.1*  HCT 29.2* 29.1* 29.7*  MCV 98.0 99.0 98.7  MCH 30.2 30.3 30.2  MCHC 30.8 30.6 30.6  RDW 26.6* 26.0* 25.4*  PLT 201 193 191    BNP No results for input(s): "BNP", "PROBNP" in the last 168 hours.     Cardiac Studies   Cardiac Studies & Procedures    ______________________________________________________________________________________________ CARDIAC CATHETERIZATION  CARDIAC CATHETERIZATION 11/21/2023  Conclusion   Prox RCA lesion is 75% stenosed.   Mid RCA-1 lesion is 75% stenosed.   Mid RCA-2 lesion is 75% stenosed.   Mid RCA to Dist RCA lesion is 90% stenosed.   Dist RCA lesion is 50% stenosed with 50% stenosed side branch in RPAV.   Ost RCA to Prox RCA lesion is 50% stenosed.   Ost LM to Mid LM lesion is 70% stenosed.   Dist LM lesion is 90% stenosed.   Ost Cx lesion is 100% stenosed.   Ost LAD to Prox LAD lesion is 95% stenosed.   Prox LAD to Dist LAD lesion is 50% stenosed.   2nd Diag lesion is 75% stenosed.   There is moderate to severe left ventricular systolic dysfunction.   LV end diastolic pressure is moderately elevated.   The left ventricular ejection fraction is 25-35% by visual estimate.   Anticipated discharge date to be determined.   Recommend to resume Apixaban, at currently prescribed dose and frequency.  Conclusion Left heart cath right radial approach Non-STEMI shortness of breath  Left ventriculogram Left ventricular enlargement global hypokinesis EF around 30%  Coronaries Left main large moderate to severe calcification 70% distal  LAD large 90% ostial 95% proximal diffuse 50% moderate to severe calcification proximally Circumflex 100% occluded ostially moderate to severe calcification RCA large serial 75% lesions mid heavy calcification 90% mid to distal moderate calcification right dominant system moderate disease diffusely distally  Intervention deferred Recommend evaluation for high risk coronary bypass surgery versus high risk PCI and stent for complex disease Medical therapy is also not unreasonable Will discuss images with tertiary care facility  Findings Coronary Findings Diagnostic  Dominance: Right  Left Main Ost LM to Mid LM lesion is 70% stenosed. Vessel is not the culprit lesion.  The lesion is type C, located at the major branch and discrete. The lesion is moderately calcified. Dist LM lesion is 90% stenosed. Vessel is not the culprit lesion. The lesion is type C and located at the bifurcation. The lesion is moderately calcified.  Left Anterior Descending Ost LAD to Prox LAD lesion is 95% stenosed. The lesion is moderately calcified. Prox LAD to Dist LAD lesion is 50% stenosed. Vessel is not the culprit lesion. The lesion is type C and concentric. The lesion is mildly calcified. The lesion was not previously treated . The stenosis was measured by a visual reading. Pressure gradient was not performed on the lesion. IVUS was not performed. No optical coherence tomography (OCT) was performed.  Second Diagonal Branch 2nd Diag lesion is 75% stenosed. Vessel is not the culprit lesion. The lesion is type C and discrete.  Left Circumflex Ost Cx lesion is 100% stenosed. Vessel is not the culprit lesion. The lesion is not complex (non high-C), located at the bifurcation, discrete and chronically occluded. The lesion is severely calcified.  Right Coronary Artery Ost RCA to Prox RCA lesion is 50% stenosed. The lesion is moderately calcified. Prox RCA lesion is 75% stenosed. The lesion is irregular. The lesion is severely calcified. Mid RCA-1 lesion is 75% stenosed. The lesion is severely calcified. Mid RCA-2 lesion is 75% stenosed. The lesion is severely calcified. Mid RCA to Dist RCA lesion is 90% stenosed. The lesion is moderately calcified. Dist RCA lesion is 50% stenosed with 50% stenosed side branch in RPAV. Vessel is not the culprit lesion. The lesion is eccentric. The lesion was not previously treated . The stenosis was measured by a visual reading.  Intervention  No interventions have been documented.   STRESS TESTS  NM MYOCAR MULTI W/SPECT W 12/21/2016  Narrative CLINICAL DATA:  Chest pain  EXAM: MYOCARDIAL IMAGING WITH SPECT (REST AND  PHARMACOLOGIC-STRESS)  GATED LEFT VENTRICULAR WALL MOTION STUDY  LEFT VENTRICULAR EJECTION FRACTION  TECHNIQUE: Standard myocardial SPECT imaging was performed after resting intravenous injection of 10 mCi Tc-74m tetrofosmin. Subsequently, intravenous infusion of Lexiscan was performed under the supervision of the Cardiology staff. At peak effect of the drug, 30 mCi Tc-68m tetrofosmin was injected intravenously and standard myocardial SPECT imaging was performed. Quantitative gated imaging was also performed to evaluate left ventricular wall motion, and estimate left ventricular ejection fraction.  COMPARISON:  None.  FINDINGS: Perfusion: There is fixed thinning of the inferior wall. Attenuation artifact is present at the apex. No stress-induced ischemia.  Wall Motion: Normal left ventricular wall motion. No left ventricular dilation.  Left Ventricular Ejection Fraction: 57 %  End diastolic volume 104 ml  End systolic volume 45 ml  IMPRESSION: 1. There is fixed thinning of the inferior wall and attenuation artifact at the apex. No stress-induced ischemia.  2. Normal left ventricular wall motion.  3. Left ventricular ejection fraction 57%  4. Non  invasive risk stratification*: Low risk.  *2012 Appropriate Use Criteria for Coronary Revascularization Focused Update: J Am Coll Cardiol. 2012;59(9):857-881. http://content.dementiazones.com.aspx?articleid=1201161   Electronically Signed By: Artice Last M.D. On: 12/21/2016 13:58   ECHOCARDIOGRAM  ECHOCARDIOGRAM COMPLETE 11/19/2023  Narrative ECHOCARDIOGRAM REPORT    Patient Name:   PARKER WHERLEY Date of Exam: 11/19/2023 Medical Rec #:  [id number]           Height: Accession #:    [id number]          Weight: Date of Birth:  Mar 28, 1941           BSA: Patient Age:    82 years            BP:           99/64 mmHg Patient Gender: M                   HR:           85 bpm. Exam Location:  [facility]  Procedure: 2D  Echo, Cardiac Doppler and Color Doppler (Both Spectral and Color Flow Doppler were utilized during procedure).  Indications:     Acutre respiratory distress R06.03  History:         Patient has prior history of Echocardiogram examinations. Stroke; Risk Factors:Hypertension.  Sonographer:     Kathaleen Pale Roar Referring Phys:  Maudine Sos MD Diagnosing Phys: Maudine Sos MD  IMPRESSIONS   1. Left ventricular ejection fraction, by estimation, is 25 to 30%. The left ventricle has severely decreased function. The left ventricle demonstrates global hypokinesis. There is mild concentric left ventricular hypertrophy. Left ventricular diastolic parameters are consistent with Grade I diastolic dysfunction (impaired relaxation). Elevated left ventricular end-diastolic pressure. 2. Right ventricular systolic function is normal. The right ventricular size is normal. There is normal pulmonary artery systolic pressure. 3. Left atrial size was moderately dilated. 4. The mitral valve is normal in structure. Mild mitral valve regurgitation. No evidence of mitral stenosis. 5. Dimensionless index in 0.23, consistent with severe aortic stenosis. The severity of the aortic stenosis is likely underestimated by mean gradient due to low flow caused by systolic dysfunction.. The aortic valve is calcified. There is severe calcifcation of the aortic valve. There is severe thickening of the aortic valve. Aortic valve regurgitation is mild to moderate. Severe aortic valve stenosis. Aortic valve mean gradient measures 21.0 mmHg. Aortic valve Vmax measures 2.95 m/s. 6. The inferior vena cava is dilated in size with <50% respiratory variability, suggesting right atrial pressure of 15 mmHg.  FINDINGS Left Ventricle: Left ventricular ejection fraction, by estimation, is 25 to 30%. The left ventricle has severely decreased function. The left ventricle demonstrates global hypokinesis. The left ventricular internal cavity  size was normal in size. There is mild concentric left ventricular hypertrophy. Left ventricular diastolic parameters are consistent with Grade I diastolic dysfunction (impaired relaxation). Elevated left ventricular end-diastolic pressure.  Right Ventricle: The right ventricular size is normal. No increase in right ventricular wall thickness. Right ventricular systolic function is normal. There is normal pulmonary artery systolic pressure. The tricuspid regurgitant velocity is 2.09 m/s, and with an assumed right atrial pressure of 15 mmHg, the estimated right ventricular systolic pressure is 32.5 mmHg.  Left Atrium: Left atrial size was moderately dilated.  Right Atrium: Right atrial size was normal in size.  Pericardium: There is no evidence of pericardial effusion.  Mitral Valve: The mitral valve is normal in structure. Mild mitral valve regurgitation. No evidence of mitral  valve stenosis.  Tricuspid Valve: The tricuspid valve is normal in structure. Tricuspid valve regurgitation is trivial. No evidence of tricuspid stenosis.  Aortic Valve: Dimensionless index in 0.23, consistent with severe aortic stenosis. The severity of the aortic stenosis is likely underestimated by mean gradient due to low flow caused by systolic dysfunction. The aortic valve is calcified. There is severe calcifcation of the aortic valve. There is severe thickening of the aortic valve. Aortic valve regurgitation is mild to moderate. Severe aortic stenosis is present. Aortic valve mean gradient measures 21.0 mmHg. Aortic valve peak gradient measures 34.8 mmHg.  Pulmonic Valve: The pulmonic valve was normal in structure. Pulmonic valve regurgitation is not visualized. No evidence of pulmonic stenosis.  Aorta: The aortic root is normal in size and structure.  Venous: The inferior vena cava is dilated in size with less than 50% respiratory variability, suggesting right atrial pressure of 15 mmHg.  IAS/Shunts: No atrial  level shunt detected by color flow Doppler.   LEFT VENTRICLE PLAX 2D LVIDd:         5.90 cm Diastology LVIDs:         5.00 cm LV e' medial:    3.90 cm/s LV PW:         1.10 cm LV E/e' medial:  34.1 LV IVS:        1.30 cm LV e' lateral:   4.65 cm/s LV E/e' lateral: 28.6   RIGHT VENTRICLE RV Basal diam:  3.60 cm RV Mid diam:    3.40 cm RV Length:      6.60 cm RV S prime:     1340.00 cm/s TAPSE (M-mode): 1.7 cm  LEFT ATRIUM LA diam:      4.00 cm LA Vol (A2C): 27.9 ml LA Vol (A4C): 80.3 ml AORTIC VALVE                    PULMONIC VALVE AV Vmax:           295.00 cm/s  PV Vmax:       83.40 m/s AV Vmean:          173.000 cm/s PV Peak grad:  27822.2 mmHg AV VTI:            0.576 m AV Peak Grad:      34.8 mmHg AV Mean Grad:      21.0 mmHg LVOT Vmax:         67.90 cm/s LVOT Vmean:        43.200 cm/s LVOT VTI:          0.134 m LVOT/AV VTI ratio: 0.23  AORTA Ao STJ diam: 2.9 cm Ao Asc diam: 3.90 cm  MV E velocity: 133.00 cm/s  TRICUSPID VALVE TV Peak grad:   17.5 mmHg TV Vmax:        2.09 m/s TR Peak grad:   17.5 mmHg TR Vmax:        209.00 cm/s  SHUNTS Systemic VTI: 0.13 m  Maudine Sos MD Electronically signed by Maudine Sos MD Signature Date/Time: 11/21/2023/5:53:04 AM    Final   TEE  ECHO TEE 09/15/2020  Narrative TRANSESOPHOGEAL ECHO REPORT    Patient Name:   Elenore Griffon Date of Exam: 09/15/2020 Medical Rec #:  [id number]     Height:       73.0 in Accession #:    [id number]    Weight:       231.0 lb Date of Birth:  10-06-1940     BSA:  2.288 m Patient Age:    79 years      BP:           129/66 mmHg Patient Gender: M             HR:           55 bpm. Exam Location:  [facility]  Procedure: Transesophageal Echo, Color Doppler, Cardiac Doppler and Saline Contrast Bubble Study  Indications:     Bacteremia 790.7/ R78.81  History:         Patient has prior history of Echocardiogram examinations, most recent 09/14/2020. Stroke; Risk  Factors:Hypertension and Diabetes.  Sonographer:     Cristela Blue RDCS (AE) Referring Phys:  [id number] Dalia Heading Diagnosing Phys: Harold Hedge MD  PROCEDURE: After discussion of the risks and benefits of a TEE, an informed consent was obtained from the patient. TEE procedure time was 30 minutes. The transesophogeal probe was passed without difficulty through the esophogus of the patient. Imaged were obtained with the patient in a left lateral decubitus position. Sedation performed by different physician. Image quality was excellent. The patient's vital signs; including heart rate, blood pressure, and oxygen saturation; remained stable throughout the procedure. The patient developed no complications during the procedure.  IMPRESSIONS   1. Left ventricular ejection fraction, by estimation, is 55 to 60%. The left ventricle has normal function. The left ventricle has no regional wall motion abnormalities. Left ventricular diastolic parameters were normal. 2. Right ventricular systolic function is normal. The right ventricular size is normal. 3. No left atrial/left atrial appendage thrombus was detected. 4. The mitral valve is normal in structure. Trivial mitral valve regurgitation. 5. The aortic valve is calcified. Aortic valve regurgitation is trivial.  Conclusion(s)/Recommendation(s): No evidence of vegetation/infective endocarditis on this transesophageal echocardiogram.  FINDINGS Left Ventricle: Left ventricular ejection fraction, by estimation, is 55 to 60%. The left ventricle has normal function. The left ventricle has no regional wall motion abnormalities. The left ventricular internal cavity size was normal in size. Left ventricular diastolic parameters were normal.  Right Ventricle: The right ventricular size is normal. No increase in right ventricular wall thickness. Right ventricular systolic function is normal.  Left Atrium: Left atrial size was normal in size. No left  atrial/left atrial appendage thrombus was detected.  Right Atrium: Right atrial size was normal in size.  Pericardium: There is no evidence of pericardial effusion.  Mitral Valve: The mitral valve is normal in structure. Trivial mitral valve regurgitation.  Tricuspid Valve: The tricuspid valve is grossly normal. Tricuspid valve regurgitation is trivial.  Aortic Valve: The aortic valve is calcified. Aortic valve regurgitation is trivial. There is no evidence of aortic valve vegetation.  Pulmonic Valve: The pulmonic valve was not well visualized. Pulmonic valve regurgitation is not visualized.  Aorta: The aortic root is normal in size and structure.  IAS/Shunts: No atrial level shunt detected by color flow Doppler. Agitated saline contrast was given intravenously to evaluate for intracardiac shunting.  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 Persistent atrial fibrillation - originally planned for DAPT unitl 12/29/23 (12/16/23 meeting- see past note for multi-disciplinary team); since he has been cleared for triple therapy continue this until 12/29/23 then DOAC and plavix - I am concerned about his long term prognosis but he  is asymptomatic from AF presently - continue statin   Severe Aortic Stenosis - he will get f/u with [facility] for consideration of SAVR  HFrEF - acute on chronic, euvolemic today - return home SGLT2i at discharge - lasix 40 mg PO daily at discharge  Symptomatic Hypotension - will DC on higher dose of midodrine (10 mg PO BID)   He is at high risk of re-admission; we discussed GOC this admission, presently no change in GOC  For questions or updates, please contact [facility] Please consult [email or web address]  for contact info under Cardiology/STEMI.      Gloriann Larger, MD FASE [facility] Cardiologist [facility]  [address] [place], [address] [place], [place] [address] [phone]  10:52 AM

## 2023-12-21 NOTE — Assessment & Plan Note (Signed)
 Acute coronary syndrome with multivessel PCI Plan to continue dual antiplatelet therapy until 12/29/23 and then continue with clopidogrel alone. Continue with statin.

## 2023-12-21 NOTE — Progress Notes (Signed)
 Daily Progress Note   Patient Name: Mathew Baker       Date: 12/21/2023 DOB: Aug 27, 1941  Age: 83 y.o. MRN#: [id number] Attending Physician: Mathew Baker Primary Care Physician: Mathew Regulus, MD Admit Date: 12/13/2023 Length of Stay: 8 days  Reason for Consultation/Follow-up: Establishing goals of care  HPI/Patient Profile:  83 y.o. male  with past medical history of hypertension, chronic systolic CHF, severe aortic stenosis, paroxysmal atrial fibrillation on chronic anticoagulation, COPD, T2DM, CVA, and hepatocellular carcinoma. Noted recent admit to [facility] for multi-vessel disease treated with LHC, PCI, IABP. He presented with worsening shortness of breath and orthopnea, in the ED he was noted to be volume overloaded. He was admitted on 12/13/2023 with acute on chronic HFrEF (25 to 30%), severe aortic stenosis, acute on chronic anemia with melena, and others.    Palliative medicine consulted for GOC conversations due to patient/family expressing desire to discuss more comfort focused approach.  Subjective:   Subjective: Chart Reviewed. Updates received. Patient Assessed. Created space and opportunity for patient  and family to explore thoughts and feelings regarding current medical situation.  Today's Discussion: Today saw the patient at bedside, no family was present initially.  Later in the visit family including patient's daughter and 2 grandchildren answered.  We reviewed the EGD, he is brought in is over.  We discussed that there was some gastritis (stomach irritation) but no active bleeding found.  His hemoglobin has been stable for the past couple days so this is good.  Overall today he states he feels good.  He feels very fortunate with his situation having a very loving family supporting him.  We talked about the plan including discharging today to rehab to try to get stronger and promote an increase quality of life at home.  He is also in agreement with  outpatient palliative care, as previously discussed.  At this point family came in and we reviewed the plan with them as well.  They are also in the understanding that he will be discharging today.  I provided emotional and general support through therapeutic listening, empathy, sharing of stories, and other techniques. I answered all questions and addressed all concerns to the best of my ability.  Review of Systems  Respiratory:  Negative for shortness of breath.   Cardiovascular:  Negative for chest pain.  Gastrointestinal:  Negative for abdominal pain, nausea and vomiting.    Objective:   Vital Signs:  BP 108/63 (BP Location: Left Arm)   Pulse 83   Temp 97.8 F (36.6 C) (Oral)   Resp 18   Ht 6\' 1"  (1.854 m)   Wt 109.4 kg   SpO2 100%   BMI 31.82 kg/m   Physical Exam Vitals and nursing note reviewed.  Constitutional:      General: He is not in acute distress.    Appearance: He is ill-appearing. He is not toxic-appearing.  HENT:     Head: Normocephalic and atraumatic.  Cardiovascular:     Rate and Rhythm: Normal rate.  Pulmonary:     Effort: Pulmonary effort is normal. No respiratory distress.  Abdominal:     General: Abdomen is flat. There is no distension.     Palpations: Abdomen is soft.  Skin:    General: Skin is warm and dry.  Neurological:     General: No focal deficit present.     Mental Status: He is alert and oriented to person, place, and time.  Psychiatric:  Mood and Affect: Mood normal.        Behavior: Behavior normal.     Palliative Assessment/Data: 60-70%    Existing Vynca/ACP Documentation: Events directive signed 02/18/2019 Goals of care document signed/15/2025  Assessment & Plan:   Impression: Present on Admission:  HFrEF (heart failure with reduced ejection fraction) (HCC)  Elevated troponin  Symptomatic anemia  GI bleed  Paroxysmal atrial fibrillation (HCC)  Orthostatic hypotension  BPH (benign prostatic hyperplasia)  GERD  (gastroesophageal reflux disease)  Acute on chronic systolic (congestive) heart failure (HCC)  SUMMARY OF RECOMMENDATIONS   DNR-limited (DNR/DNI) Full scope of care otherwise Anticipate discharge today to SNF/rehab with outpatient palliative care services Palliative medicine will sign off at this time as goals are clear and anticipate discharge today  Symptom Management:  Per primary team PMT is available to assist as needed  Code Status: DNR-limited  Prognosis: Unable to determine  Discharge Planning: Skilled Nursing Facility for rehab with Palliative care service follow-up  Discussed with: Patient, family, medical team, nursing team  Thank you for allowing us  to participate in the care of Mathew Baker PMT will continue to support holistically.  Time Total: 25 min  Detailed review of medical records (labs, imaging, vital signs), medically appropriate exam, discussed with treatment team, counseling and education to patient, family, & staff, documenting clinical information, medication management, coordination of care  Mathew Right, NP Palliative Medicine Team  Team Phone # [phone] (Nights/Weekends)  05/05/2021, 8:17 AM

## 2023-12-21 NOTE — Plan of Care (Signed)
   Problem: Education: Goal: Knowledge of General Education information will improve Description Including pain rating scale, medication(s)/side effects and non-pharmacologic comfort measures Outcome: Progressing

## 2023-12-21 NOTE — Assessment & Plan Note (Signed)
 Calculated BMI is 31.8

## 2023-12-21 NOTE — Progress Notes (Signed)
 Occupational Therapy Treatment Patient Details Name: Mathew Baker MRN: [id number] DOB: 1941/02/05 Today's Date: 12/21/2023   History of present illness Mathew Baker is a 83 y.o. male admitted 12/13/23 for SOB and syncope. 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 Of note, recent hospitalizations 3/18 for multivessel disease staged PCI and 3/31-4/3 after being found to have hematuria with acute blood loss on Eliquis.   OT comments  Pt received in supine, pleasant and agreeable to therapy. Pt able to get EOB with no physical assist and don socks using figure 4 method. Sit to stand completed with light CGA/supervision, pt ambulated to sink to complete self care and UB ADLs while seated in chair, supervision throughout. Pt returned to recliner following with all needs met. BP start of session 92/62 and end of session was 114/70. Acute OT to continue to follow to address established goals to facilitate DC to next venue of care.        If plan is discharge home, recommend the following:  A little help with walking and/or transfers;A little help with bathing/dressing/bathroom;Assistance with cooking/housework;Assist for transportation   Equipment Recommendations  Tub/shower seat    Recommendations for Other Services      Precautions / Restrictions Precautions Precautions: Fall Recall of Precautions/Restrictions: Intact Precaution/Restrictions Comments: watch BP Restrictions Weight Bearing Restrictions Per Provider Order: No       Mobility Bed Mobility Overal bed mobility: Needs Assistance Bed Mobility: Supine to Sit     Supine to sit: Supervision          Transfers Overall transfer level: Needs assistance Equipment used: Rolling walker (2  wheels) Transfers: Sit to/from Stand Sit to Stand: Contact guard assist, Supervision           General transfer comment: pt stood from EOB and able to power up from there to stand with RW, CGA for safety but mainly under supervision     Balance Overall balance assessment: Needs assistance Sitting-balance support: Feet supported Sitting balance-Leahy Scale: Good Sitting balance - Comments: EOB   Standing balance support: Bilateral upper extremity supported, During functional activity, Reliant on assistive device for balance Standing balance-Leahy Scale: Poor Standing balance comment: reliant on RW while ambulating, does well using external support around him to maintain stability                           ADL either performed or assessed with clinical judgement   ADL Overall ADL's : Needs assistance/impaired     Grooming: Wash/dry hands;Wash/dry face;Oral care;Supervision/safety;Sitting Grooming Details (indicate cue type and reason): pt seated in chair at sink to complete self care/grooming Upper Body Bathing: Supervision/ safety;Sitting Upper Body Bathing Details (indicate cue type and reason): completed while seated at sink with supervision for safety         Lower Body Dressing: Supervision/safety;Sitting/lateral leans Lower Body Dressing Details (indicate cue type and reason): to don socks before getting OOB             Functional mobility during ADLs: Supervision/safety;Rolling walker (2 wheels) General ADL Comments: ADLs completed at sink while seated this session, pt completed oral care and UB bathing declining any other self care. Socks donned EOB using figure four, no c/o dizziness throughout    Extremity/Trunk Assessment  Vision       Perception     Praxis     Communication Communication Communication: No apparent difficulties Factors Affecting Communication: Hearing impaired   Cognition Arousal: Alert Behavior During  Therapy: WFL for tasks assessed/performed Cognition: No apparent impairments                               Following commands: Intact        Cueing   Cueing Techniques: Verbal cues  Exercises      Shoulder Instructions       General Comments VSS on RA, BP 92/62(73) start of session, 114/70(85) at end of session    Pertinent Vitals/ Pain       Pain Assessment Pain Assessment: No/denies pain  Home Living                                          Prior Functioning/Environment              Frequency  Min 2X/week        Progress Toward Goals  OT Goals(current goals can now be found in the care plan section)  Progress towards OT goals: Progressing toward goals  Acute Rehab OT Goals Patient Stated Goal: get better OT Goal Formulation: With patient Time For Goal Achievement: 12/29/23 Potential to Achieve Goals: Good ADL Goals Pt Will Perform Grooming: with modified independence;standing Pt Will Perform Lower Body Dressing: with modified independence;sit to/from stand Pt Will Transfer to Toilet: with modified independence;regular height toilet;ambulating Pt Will Perform Tub/Shower Transfer: Shower transfer;ambulating  Plan      Co-evaluation                 AM-PAC OT "6 Clicks" Daily Activity     Outcome Measure   Help from another person eating meals?: None Help from another person taking care of personal grooming?: None Help from another person toileting, which includes using toliet, bedpan, or urinal?: A Little Help from another person bathing (including washing, rinsing, drying)?: A Little Help from another person to put on and taking off regular upper body clothing?: None Help from another person to put on and taking off regular lower body clothing?: A Little 6 Click Score: 21    End of Session Equipment Utilized During Treatment: Gait belt;Rolling walker (2 wheels)  OT Visit Diagnosis: History of falling  (Z91.81)   Activity Tolerance Patient tolerated treatment well   Patient Left in chair;with call bell/phone within reach;with chair alarm set   Nurse Communication Mobility status        Time: 0720-0740 OT Time Calculation (min): 20 min  Charges: OT General Charges $OT Visit: 1 Visit OT Treatments $Self Care/Home Management : 8-22 mins  Constantin Hillery, BS, OTA/S   Cindy Fullman 12/21/2023, 11:50 AM

## 2023-12-21 NOTE — Hospital Course (Addendum)
 [name] was admitted to the hospital with the working diagnosis of acute on chronic heart failure exacerbation.   83 yo male with the past medical history of hypertension, hyperlipidemia, heart failure, aortic stenosis, paroxysmal atrial fibrillation, T2DM , history of CVA, COPD, and hepatocellular carcinoma who presented with dyspnea.  11/22/23 he was transferred from [facility] to [facility] due coronary artery disease, multivessel staged PCI.  03/31 to 12/08/23 hospitalized at [facility] for hematuria, with acute blood loss anemia. He required 2 units PRBC transfusion.  For the last 2 to 3 days prior to current hospitalization patient had worsening dyspnea, on exertion along with cough and foamy sputum, prompting him to come to the hospital. Positive melena.  On his initial physical examination his blood pressure was 104/59, HR 82, RR 21 and 02 saturation 98%, lungs with no wheezing or rhonchi, heart with S1 and S2 present irregularly irregular with no gallops or rubs, abdomen with no distention and positive + pitting bilateral lower extremity edema.  Na 139 K 3,9 Cl 107 bicarbonate 24 glucose 62, bun 29 cr 1,0  BNP 549  High sensitive troponin 530 and 497  Wbc 6,0 hgb 7,0 plt 220   Chest radiograph with positive cardiomegaly, bilateral hilar vascular congestion with central interstitial infiltrates, small to moderate right pleural effusion and small left pleural effusion.   EKG 81 bpm, left axis deviation, left bundle branch block, left anterior fascicular block, qtc 447, atrial fibrillation rhythm with no significant ST segment or  T wave changes.   Patient was placed on IV furosemide for diuresis with good toleration.  Her required 4 units PRBC transfusion.  Underwent further work up with EGD showing gastritis.  Patient was resumed with antiplatelet therapy with good toleration,   Patient very weak and deconditioned, decision was made to transfer to SNF to continue physical therapy.

## 2023-12-21 NOTE — Assessment & Plan Note (Signed)
 His hear rate has been controlled, will continue anticoagulation with apixaban.  Follow up as outpatient.

## 2023-12-21 NOTE — Assessment & Plan Note (Addendum)
 Echocardiogram with reduced LV systolic function with EF 25 to 30%, global hypokinesis, mild LVH, RV systolic function preserved, RVSP 32.5 mmHg, LA with moderate dilatation, mild mitral valve regurgitation, severe aortic stenosis, with features of low flow, aortic valve regurgitation is mild to moderate, no pericardial effusion.   Patient was placed on furosemide for diuresis with good toleration, negative fluid balance was achieved with improvement in his symptoms.  Limited pharmacologic therapy due to hypotension.  Continue midodrine for blood pressure support.  Will resume SGLT 2 inh at the time of discharge.   Patient will follow up with [facility] for aortic valve, consideration for SAVR.

## 2023-12-21 NOTE — TOC Transition Note (Signed)
 Transition of Care [facility]) - Discharge Note   Patient Details  Name: Mathew Baker MRN: [id number] Date of Birth: 1940/10/16  Transition of [facility]) CM/SW Contact:  Arron Big, LCSWA Phone Number: 12/21/2023, 1:12 PM   Clinical Narrative:   Patient will DC to: [facility] Anticipated DC date: 12/21/23 Family notified: Mathew Baker Transport by: Lyna Sandhoff   Per MD patient ready for DC to [facility] [place]. RN to call report prior to discharge [phone]; rm [phone]). RN, patient, patient's family, and facility notified of DC. Discharge Summary and FL2 sent to facility. DC packet on chart. Ambulance transport requested for patient 1:14 PM.   CSW will sign off for now as social work intervention is no longer needed. Please consult us  again if new needs arise.      Final next level of care: Skilled Nursing Facility Barriers to Discharge: Barriers Resolved   Patient Goals and CMS Choice Patient states their goals for this hospitalization and ongoing recovery are:: Unable to assess          Discharge Placement              Patient chooses bed at: [facility] Patient to be transferred to facility by: [facility] Name of family member notified: Mathew Baker Patient and family notified of of transfer: 12/21/23  Discharge Plan and Services Additional resources added to the After Visit Summary for   In-house Referral: Clinical Social Work                          [facility] Agency: [facility] Date HH Agency Contacted: 12/08/23   Representative spoke with at [facility] Agency: Shaun  Social Drivers of Health (SDOH) Interventions SDOH Screenings   Food Insecurity: No Food Insecurity (12/13/2023)  Housing: Low Risk  (12/13/2023)  Transportation Needs: No Transportation Needs (12/13/2023)  Utilities: Not At Risk (12/13/2023)  Financial Resource Strain: Low Risk  (11/26/2023)   Received from [facility]   Social Connections: Moderately Isolated (12/13/2023)  Tobacco Use: Medium Risk (12/20/2023)     Readmission Risk Interventions     No data to display

## 2023-12-21 NOTE — Inpatient Diabetes Management (Signed)
 Inpatient Diabetes Program Recommendations  AACE/ADA: New Consensus Statement on Inpatient Glycemic Control (2015)  Target Ranges:  Prepandial:   less than 140 mg/dL      Peak postprandial:   less than 180 mg/dL (1-2 hours)      Critically ill patients:  140 - 180 mg/dL   Lab Results  Component Value Date   GLUCAP 233 (H) 12/21/2023   HGBA1C 9.1 (H) 11/18/2023    Review of Glycemic Control  Latest Reference Range & Units 12/20/23 08:10 12/20/23 11:07 12/20/23 15:17 12/20/23 22:15 12/21/23 06:02  Glucose-Capillary 70 - 99 mg/dL 161 (H) 096 (H) 045 (H) 292 (H) 233 (H)   Diabetes history: Type 2 DM Outpatient Diabetes medications: Lantus 50 units BID, Novolog 0-6 units TID Current orders for Inpatient glycemic control:  Semglee 8 units every day Novolog 0-15 units TID  Inpatient Diabetes Program Recommendations:    -   consider adding Novolog 3 units TID (Assuming patient is consuming >50% of meals)  Thanks, Eloise Hake RN, MSN, BC-ADM Inpatient Diabetes Coordinator Team Pager [phone] (8a-5p)

## 2023-12-22 NOTE — Progress Notes (Signed)
 Letter mailed home

## 2023-12-26 ENCOUNTER — Inpatient Hospital Stay
Admission: EM | Admit: 2023-12-26 | Discharge: 2024-01-05 | DRG: 377 | Disposition: E | Source: Skilled Nursing Facility | Attending: Internal Medicine | Admitting: Internal Medicine

## 2023-12-26 ENCOUNTER — Other Ambulatory Visit: Payer: Self-pay

## 2023-12-26 DIAGNOSIS — I2510 Atherosclerotic heart disease of native coronary artery without angina pectoris: Secondary | ICD-10-CM | POA: Diagnosis present

## 2023-12-26 DIAGNOSIS — Z79899 Other long term (current) drug therapy: Secondary | ICD-10-CM

## 2023-12-26 DIAGNOSIS — J449 Chronic obstructive pulmonary disease, unspecified: Secondary | ICD-10-CM | POA: Diagnosis present

## 2023-12-26 DIAGNOSIS — D62 Acute posthemorrhagic anemia: Secondary | ICD-10-CM | POA: Insufficient documentation

## 2023-12-26 DIAGNOSIS — R578 Other shock: Secondary | ICD-10-CM | POA: Diagnosis present

## 2023-12-26 DIAGNOSIS — Z9842 Cataract extraction status, left eye: Secondary | ICD-10-CM

## 2023-12-26 DIAGNOSIS — Z7189 Other specified counseling: Secondary | ICD-10-CM | POA: Diagnosis not present

## 2023-12-26 DIAGNOSIS — H9190 Unspecified hearing loss, unspecified ear: Secondary | ICD-10-CM | POA: Diagnosis present

## 2023-12-26 DIAGNOSIS — I5023 Acute on chronic systolic (congestive) heart failure: Secondary | ICD-10-CM | POA: Diagnosis present

## 2023-12-26 DIAGNOSIS — T45515A Adverse effect of anticoagulants, initial encounter: Secondary | ICD-10-CM | POA: Diagnosis present

## 2023-12-26 DIAGNOSIS — K5521 Angiodysplasia of colon with hemorrhage: Principal | ICD-10-CM | POA: Diagnosis present

## 2023-12-26 DIAGNOSIS — K219 Gastro-esophageal reflux disease without esophagitis: Secondary | ICD-10-CM | POA: Diagnosis present

## 2023-12-26 DIAGNOSIS — K922 Gastrointestinal hemorrhage, unspecified: Secondary | ICD-10-CM | POA: Diagnosis present

## 2023-12-26 DIAGNOSIS — Z87891 Personal history of nicotine dependence: Secondary | ICD-10-CM

## 2023-12-26 DIAGNOSIS — I350 Nonrheumatic aortic (valve) stenosis: Secondary | ICD-10-CM | POA: Diagnosis present

## 2023-12-26 DIAGNOSIS — Z515 Encounter for palliative care: Secondary | ICD-10-CM

## 2023-12-26 DIAGNOSIS — I110 Hypertensive heart disease with heart failure: Secondary | ICD-10-CM | POA: Diagnosis present

## 2023-12-26 DIAGNOSIS — Z794 Long term (current) use of insulin: Secondary | ICD-10-CM

## 2023-12-26 DIAGNOSIS — Z7984 Long term (current) use of oral hypoglycemic drugs: Secondary | ICD-10-CM

## 2023-12-26 DIAGNOSIS — Z7902 Long term (current) use of antithrombotics/antiplatelets: Secondary | ICD-10-CM | POA: Diagnosis not present

## 2023-12-26 DIAGNOSIS — Z66 Do not resuscitate: Secondary | ICD-10-CM | POA: Diagnosis present

## 2023-12-26 DIAGNOSIS — I480 Paroxysmal atrial fibrillation: Secondary | ICD-10-CM | POA: Diagnosis present

## 2023-12-26 DIAGNOSIS — Z833 Family history of diabetes mellitus: Secondary | ICD-10-CM

## 2023-12-26 DIAGNOSIS — I4891 Unspecified atrial fibrillation: Secondary | ICD-10-CM | POA: Diagnosis present

## 2023-12-26 DIAGNOSIS — E119 Type 2 diabetes mellitus without complications: Secondary | ICD-10-CM | POA: Diagnosis present

## 2023-12-26 DIAGNOSIS — K2970 Gastritis, unspecified, without bleeding: Secondary | ICD-10-CM | POA: Diagnosis present

## 2023-12-26 DIAGNOSIS — T45525A Adverse effect of antithrombotic drugs, initial encounter: Secondary | ICD-10-CM | POA: Diagnosis present

## 2023-12-26 DIAGNOSIS — Z8701 Personal history of pneumonia (recurrent): Secondary | ICD-10-CM

## 2023-12-26 DIAGNOSIS — Z9841 Cataract extraction status, right eye: Secondary | ICD-10-CM

## 2023-12-26 DIAGNOSIS — Z7982 Long term (current) use of aspirin: Secondary | ICD-10-CM

## 2023-12-26 DIAGNOSIS — Z961 Presence of intraocular lens: Secondary | ICD-10-CM | POA: Diagnosis present

## 2023-12-26 DIAGNOSIS — Z7901 Long term (current) use of anticoagulants: Secondary | ICD-10-CM

## 2023-12-26 DIAGNOSIS — D649 Anemia, unspecified: Secondary | ICD-10-CM

## 2023-12-26 DIAGNOSIS — E785 Hyperlipidemia, unspecified: Secondary | ICD-10-CM | POA: Diagnosis present

## 2023-12-26 DIAGNOSIS — Z8673 Personal history of transient ischemic attack (TIA), and cerebral infarction without residual deficits: Secondary | ICD-10-CM

## 2023-12-26 DIAGNOSIS — T39015A Adverse effect of aspirin, initial encounter: Secondary | ICD-10-CM | POA: Diagnosis present

## 2023-12-26 DIAGNOSIS — D6832 Hemorrhagic disorder due to extrinsic circulating anticoagulants: Secondary | ICD-10-CM | POA: Diagnosis present

## 2023-12-26 DIAGNOSIS — Z9861 Coronary angioplasty status: Secondary | ICD-10-CM

## 2023-12-26 DIAGNOSIS — Z8505 Personal history of malignant neoplasm of liver: Secondary | ICD-10-CM

## 2023-12-26 LAB — COMPREHENSIVE METABOLIC PANEL WITH GFR
ALT: 17 U/L (ref 0–44)
AST: 32 U/L (ref 15–41)
Albumin: 2.5 g/dL — ABNORMAL LOW (ref 3.5–5.0)
Alkaline Phosphatase: 60 U/L (ref 38–126)
Anion gap: 13 (ref 5–15)
BUN: 46 mg/dL — ABNORMAL HIGH (ref 8–23)
CO2: 23 mmol/L (ref 22–32)
Calcium: 7.9 mg/dL — ABNORMAL LOW (ref 8.9–10.3)
Chloride: 106 mmol/L (ref 98–111)
Creatinine, Ser: 1.18 mg/dL (ref 0.61–1.24)
GFR, Estimated: >60 mL/min (ref >60)
Glucose, Bld: 185 mg/dL — ABNORMAL HIGH (ref 70–99)
Potassium: 3.7 mmol/L (ref 3.5–5.1)
Sodium: 142 mmol/L (ref 135–145)
Total Bilirubin: 0.8 mg/dL (ref 0.0–1.2)
Total Protein: 4.7 g/dL — ABNORMAL LOW (ref 6.5–8.1)

## 2023-12-26 LAB — CBC
HCT: 21.6 % — ABNORMAL LOW (ref 39.0–52.0)
Hemoglobin: 6.3 g/dL — ABNORMAL LOW (ref 13.0–17.0)
MCH: 30.4 pg (ref 26.0–34.0)
MCHC: 29.2 g/dL — ABNORMAL LOW (ref 30.0–36.0)
MCV: 104.3 fL — ABNORMAL HIGH (ref 80.0–100.0)
Platelets: 182 10*3/uL (ref 150–400)
RBC: 2.07 MIL/uL — ABNORMAL LOW (ref 4.22–5.81)
RDW: 23.6 % — ABNORMAL HIGH (ref 11.5–15.5)
WBC: 6 10*3/uL (ref 4.0–10.5)
nRBC: 0 % (ref 0.0–0.2)

## 2023-12-26 LAB — PROTIME-INR
INR: 1.7 — ABNORMAL HIGH (ref 0.8–1.2)
Prothrombin Time: 19.8 s — ABNORMAL HIGH (ref 11.4–15.2)

## 2023-12-26 LAB — POC OCCULT BLOOD, ED: Fecal Occult Blood: POSITIVE — AB

## 2023-12-26 MED ORDER — ACETAMINOPHEN 325 MG PO TABS: 650.0000 mg | ORAL_TABLET | Freq: Four times a day (QID) | ORAL | Status: DC | PRN

## 2023-12-26 MED ORDER — TAMSULOSIN HCL 0.4 MG PO CAPS
0.4000 mg | ORAL_CAPSULE | Freq: Every day | ORAL | Status: DC
Start: 2023-12-26 — End: 2023-12-26

## 2023-12-26 MED ORDER — DOCUSATE SODIUM 100 MG PO CAPS: 100.0000 mg | ORAL_CAPSULE | Freq: Two times a day (BID) | ORAL | Status: DC

## 2023-12-26 MED ORDER — LORAZEPAM 2 MG/ML IJ SOLN: 2.0000 mg | INTRAMUSCULAR | Status: DC | PRN

## 2023-12-26 MED ORDER — PANTOPRAZOLE SODIUM 40 MG PO TBEC: 40.0000 mg | DELAYED_RELEASE_TABLET | Freq: Two times a day (BID) | ORAL | Status: DC

## 2023-12-26 MED ORDER — IPRATROPIUM-ALBUTEROL 0.5-2.5 (3) MG/3ML IN SOLN: 3.0000 mL | RESPIRATORY_TRACT | Status: DC

## 2023-12-26 MED ORDER — GLYCOPYRROLATE 0.2 MG/ML IJ SOLN: 0.2000 mg | INTRAMUSCULAR | Status: DC | PRN

## 2023-12-26 MED ORDER — ACETAMINOPHEN 325 MG RE SUPP: 650.0000 mg | Freq: Four times a day (QID) | RECTAL | Status: DC | PRN

## 2023-12-26 MED ORDER — MORPHINE 100MG IN NS 100ML (1MG/ML) PREMIX INFUSION: 1.0000 mg/h | INTRAVENOUS | Status: DC

## 2023-12-26 MED ORDER — GLYCOPYRROLATE 1 MG PO TABS: 1.0000 mg | ORAL_TABLET | ORAL | Status: DC | PRN

## 2023-12-26 MED ORDER — DIGOXIN 0.25 MG/ML IJ SOLN
0.1250 mg | Freq: Four times a day (QID) | INTRAMUSCULAR | Status: DC
  Administered 2023-12-26: 0.125 mg via INTRAVENOUS
  Filled 2023-12-26 (×2): qty 2

## 2023-12-26 MED ORDER — ATORVASTATIN CALCIUM 20 MG PO TABS: 80.0000 mg | ORAL_TABLET | Freq: Every day | ORAL | Status: DC

## 2023-12-26 MED ORDER — POLYVINYL ALCOHOL 1.4 % OP SOLN: 1.0000 [drp] | Freq: Four times a day (QID) | OPHTHALMIC | Status: DC | PRN

## 2023-12-26 MED ORDER — MIDODRINE HCL 5 MG PO TABS: 10.0000 mg | ORAL_TABLET | Freq: Three times a day (TID) | ORAL | Status: DC

## 2023-12-26 MED ORDER — ONDANSETRON HCL 4 MG/2ML IJ SOLN
4.0000 mg | Freq: Once | INTRAMUSCULAR | Status: AC
  Administered 2023-12-26: 4 mg via INTRAVENOUS
  Filled 2023-12-26: qty 2

## 2023-12-26 MED ORDER — ENSURE ENLIVE PO LIQD: 237.0000 mL | Freq: Three times a day (TID) | ORAL | Status: DC

## 2023-12-26 MED ORDER — ASPIRIN 81 MG PO TBEC: 81.0000 mg | DELAYED_RELEASE_TABLET | Freq: Every day | ORAL | Status: DC

## 2023-12-26 MED ORDER — SODIUM CHLORIDE 0.9% FLUSH
3.0000 mL | Freq: Two times a day (BID) | INTRAVENOUS | Status: DC
  Administered 2023-12-26: 10 mL via INTRAVENOUS

## 2023-12-26 MED ORDER — SODIUM CHLORIDE 0.9% FLUSH: 3.0000 mL | INTRAVENOUS | Status: DC | PRN

## 2023-12-26 MED ORDER — GLYCOPYRROLATE 0.2 MG/ML IJ SOLN
0.2000 mg | INTRAMUSCULAR | Status: DC | PRN
  Filled 2023-12-26: qty 1

## 2023-12-26 MED ORDER — INSULIN ASPART 100 UNIT/ML IJ SOLN: 0.0000 [IU] | Freq: Three times a day (TID) | INTRAMUSCULAR | Status: DC

## 2023-12-26 MED ORDER — ONDANSETRON HCL 4 MG PO TABS: 4.0000 mg | ORAL_TABLET | Freq: Four times a day (QID) | ORAL | Status: DC | PRN

## 2023-12-26 MED ORDER — MORPHINE SULFATE (PF) 2 MG/ML IV SOLN
2.0000 mg | INTRAVENOUS | Status: DC | PRN
  Administered 2023-12-26 (×3): 4 mg via INTRAVENOUS
  Filled 2023-12-26 (×3): qty 2

## 2023-12-26 MED ORDER — ONDANSETRON HCL 4 MG/2ML IJ SOLN
4.0000 mg | Freq: Four times a day (QID) | INTRAMUSCULAR | Status: DC | PRN
  Administered 2023-12-26: 4 mg via INTRAVENOUS
  Filled 2023-12-26: qty 2

## 2023-12-26 MED ORDER — LACTATED RINGERS IV BOLUS
1000.0000 mL | Freq: Once | INTRAVENOUS | Status: AC
  Administered 2023-12-26: 1000 mL via INTRAVENOUS

## 2023-12-27 LAB — TYPE AND SCREEN
ABO/RH(D): B POS
Antibody Screen: NEGATIVE
Unit division: 0
Unit division: 0

## 2023-12-27 LAB — BPAM RBC
Blood Product Expiration Date: 202505152359
Blood Product Expiration Date: 202505152359
ISSUE DATE / TIME: 202504211140
Unit Type and Rh: 7300
Unit Type and Rh: 7300

## 2023-12-27 LAB — PREPARE RBC (CROSSMATCH)

## 2024-01-05 NOTE — Death Summary Note (Signed)
 DEATH SUMMARY   Patient Details  Name: Mathew Baker MRN: [id number] DOB: Jul 09, 1941 WUJ:[id number], Mathew [place], MD Admission/Discharge Information   Admit Date:  01-02-24  Date of Death:    Time of Death:    Length of Stay: 0   Principle Cause of death: GI bleed and hemorrhagic shock  Hospital Diagnoses: Principal Problem:   Lower GI bleed Active Problems:   Acute on chronic systolic (congestive) heart failure (HCC)   A-fib (HCC)   Acute blood loss anemia   GI bleed   Hospital Course:  Patient is a 83 year old male patient with history of recurrent lower GI bleed status post recent EGD study but so far no clear bleeding source identified, with other significant comorbidity of chronic HFrEF with LVEF 25-35% and severe aortic stenosis PAF on Eliquis  CVA and CAD on aspirin  Plavix  COPD diabetes presented with worsening of rectal bleed.  ED workup found patient has significant anemia compared to his baseline of last week and today's hemoglobin is 6.3 compared to 9.1 off last week.  And patient continued to pass black tarry stool mixed with blood.  Conditions are critical, as patient deteriorated into hemorrhagic shock with ongoing GI bleed.  Patient's last dose of Eliquis  was yesterday evening, no Kcentra indicated.  Initially plan was to give patient PRBC x 2 and recheck H&H but hold off further aggressive GI workup as per patient and family's wishes.  I also offered CTA abdomen pelvis for possible IR intervention.  However after knowing the benefits versus risks and prognosis of patient's conditions, along with complications informed multiple critical baseline conditions such as CHF severe AS, further workup and management of ongoing GI bleed unlikely to lead any recovery or even prolonging of his life.  Family/POA eventually decided comfort care.  PRBC transfusion canceled and active medical treatment discontinued.  And patient placed on comfort care, we also consulted palliative  care.  In the afternoon at 5:58, patient passed away.       Procedures: None  Consultations: None  The results of significant diagnostics from this hospitalization (including imaging, microbiology, ancillary and laboratory) are listed below for reference.   Significant Diagnostic Studies: DG Chest 2 View Result Date: 12/13/2023 CLINICAL DATA:  dyspnea EXAM: CHEST - 2 VIEW COMPARISON:  11/20/2023 FINDINGS: New small to moderate right pleural effusion. New airspace disease at the right lung base. Some improved left retrocardiac aeration. Heart size and mediastinal contours are within normal limits. Aortic Atherosclerosis (ICD10-170.0). Visualized bones unremarkable. IMPRESSION: New small to moderate right pleural effusion and right basilar airspace disease. Electronically Signed   By: Nicoletta Barrier M.D.   On: 12/13/2023 09:02   US  Venous Img Lower Bilateral Result Date: 12/06/2023 CLINICAL DATA:  Lower extremity swelling EXAM: BILATERAL LOWER EXTREMITY VENOUS DOPPLER ULTRASOUND TECHNIQUE: Gray-scale sonography with compression, as well as color and duplex ultrasound, were performed to evaluate the deep venous system(s) from the level of the common femoral vein through the popliteal and proximal calf veins. COMPARISON:  None Available. FINDINGS: VENOUS Normal compressibility of the common femoral, superficial femoral, and popliteal veins, as well as the visualized calf veins. Visualized portions of profunda femoral vein and great saphenous vein unremarkable. No filling defects to suggest DVT on grayscale or color Doppler imaging. Doppler waveforms show normal direction of venous flow, normal respiratory plasticity and response to augmentation. Limited views of the contralateral common femoral vein are unremarkable. OTHER None. Limitations: none IMPRESSION: Negative. Electronically Signed   By: Philipp Brawn.D.  On: 12/06/2023 02:45   CT Angio Chest PE W/Cm &/Or Wo Cm Result Date: 12/06/2023 CLINICAL  DATA:  Fall, blunt chest trauma, lower abdominal ecchymosis, hematuria EXAM: CT ANGIOGRAPHY CHEST CT ABDOMEN AND PELVIS WITH CONTRAST TECHNIQUE: Multidetector CT imaging of the chest was performed using the standard protocol during bolus administration of intravenous contrast. Multiplanar CT image reconstructions and MIPs were obtained to evaluate the vascular anatomy. Multidetector CT imaging of the abdomen and pelvis was performed using the standard protocol during bolus administration of intravenous contrast. RADIATION DOSE REDUCTION: This exam was performed according to the departmental dose-optimization program which includes automated exposure control, adjustment of the mA and/or kV according to patient size and/or use of iterative reconstruction technique. CONTRAST:  OMNIPAQUE  IOHEXOL  350 MG/ML SOLN COMPARISON:  11/18/2023 FINDINGS: CTA CHEST FINDINGS Cardiovascular: There is adequate opacification of the pulmonary arterial tree. No intraluminal filling defect identified to suggest acute pulmonary embolism. The central pulmonary arteries are of normal caliber. There is extensive multi-vessel coronary artery calcification. Advanced calcification of the aortic valve leaflets. Global cardiac size is within normal limits. No pericardial effusion. Moderate atherosclerotic calcification within the thoracic aorta. No aortic aneurysm. Mediastinum/Nodes: No enlarged mediastinal, hilar, or axillary lymph nodes. Thyroid gland, trachea, and esophagus demonstrate no significant findings. Lungs/Pleura: Moderate right and small left pleural effusions are again identified, slightly enlarged on the right with compressive atelectasis of the lungs dependently. There is progressive subtotal collapse of the right lower lobe. No pneumothorax. No central obstructing lesion Musculoskeletal: Osseous structures are age appropriate. No acute bone abnormality. No lytic or blastic bone lesion. Review of the MIP images confirms the  above findings. CT ABDOMEN and PELVIS FINDINGS Hepatobiliary: 25 mm hypodense lesions seen within left hepatic dome, similar to prior examination, but indeterminate. The liver is otherwise unremarkable. No intra or extrahepatic biliary ductal dilation. Status post cholecystectomy. Pancreas: Unremarkable Spleen: Unremarkable Adrenals/Urinary Tract: Adrenal glands are unremarkable. The kidneys are normal in size and position. Mild right renal cortical scarring. Stable moderate bilateral nonspecific perinephric edema. No hydronephrosis. No intrarenal or ureteral calculi. The bladder is decompressed with a Foley catheter balloon seen within its lumen. Stomach/Bowel: Mild sigmoid diverticulosis. Stomach, small bowel, and large bowel are otherwise unremarkable. Appendix normal. No evidence of obstruction or focal inflammation. No free intraperitoneal gas or fluid. Vascular/Lymphatic: Extensive aortoiliac atherosclerotic calcification with particularly atherosclerotic calcification at the origin of the mesenteric and renal arterial vasculature no pathologic adenopathy within the abdomen and pelvis. Reproductive: More prostatic hypertrophy. Other: Moderate diffuse subcutaneous body wall edema. No abdominal hernia. Musculoskeletal: No acute bone abnormality. No lytic or blastic lesion. Review of the MIP images confirms the above findings. IMPRESSION: 1. No pulmonary embolism. No acute intrathoracic or intra-abdominal pathology identified. 2. Moderate right and small left pleural effusions, slightly enlarged on the right with progressive subtotal collapse of the right lower lobe. 3. 25 mm hypodense lesion within the left hepatic dome, similar to prior examination, but indeterminate. This could be better assessed with hepatic protocol MRI examination when clinically appropriate. 4. Mild sigmoid diverticulosis. 5. Moderate diffuse subcutaneous body wall edema. Aortic Atherosclerosis (ICD10-I70.0). Electronically Signed   By:  Worthy Heads M.D.   On: 12/06/2023 01:36   CT ABDOMEN PELVIS W CONTRAST Result Date: 12/06/2023 CLINICAL DATA:  Fall, blunt chest trauma, lower abdominal ecchymosis, hematuria EXAM: CT ANGIOGRAPHY CHEST CT ABDOMEN AND PELVIS WITH CONTRAST TECHNIQUE: Multidetector CT imaging of the chest was performed using the standard protocol during bolus administration of intravenous contrast. Multiplanar  CT image reconstructions and MIPs were obtained to evaluate the vascular anatomy. Multidetector CT imaging of the abdomen and pelvis was performed using the standard protocol during bolus administration of intravenous contrast. RADIATION DOSE REDUCTION: This exam was performed according to the departmental dose-optimization program which includes automated exposure control, adjustment of the mA and/or kV according to patient size and/or use of iterative reconstruction technique. CONTRAST:  OMNIPAQUE  IOHEXOL  350 MG/ML SOLN COMPARISON:  11/18/2023 FINDINGS: CTA CHEST FINDINGS Cardiovascular: There is adequate opacification of the pulmonary arterial tree. No intraluminal filling defect identified to suggest acute pulmonary embolism. The central pulmonary arteries are of normal caliber. There is extensive multi-vessel coronary artery calcification. Advanced calcification of the aortic valve leaflets. Global cardiac size is within normal limits. No pericardial effusion. Moderate atherosclerotic calcification within the thoracic aorta. No aortic aneurysm. Mediastinum/Nodes: No enlarged mediastinal, hilar, or axillary lymph nodes. Thyroid gland, trachea, and esophagus demonstrate no significant findings. Lungs/Pleura: Moderate right and small left pleural effusions are again identified, slightly enlarged on the right with compressive atelectasis of the lungs dependently. There is progressive subtotal collapse of the right lower lobe. No pneumothorax. No central obstructing lesion Musculoskeletal: Osseous structures are age  appropriate. No acute bone abnormality. No lytic or blastic bone lesion. Review of the MIP images confirms the above findings. CT ABDOMEN and PELVIS FINDINGS Hepatobiliary: 25 mm hypodense lesions seen within left hepatic dome, similar to prior examination, but indeterminate. The liver is otherwise unremarkable. No intra or extrahepatic biliary ductal dilation. Status post cholecystectomy. Pancreas: Unremarkable Spleen: Unremarkable Adrenals/Urinary Tract: Adrenal glands are unremarkable. The kidneys are normal in size and position. Mild right renal cortical scarring. Stable moderate bilateral nonspecific perinephric edema. No hydronephrosis. No intrarenal or ureteral calculi. The bladder is decompressed with a Foley catheter balloon seen within its lumen. Stomach/Bowel: Mild sigmoid diverticulosis. Stomach, small bowel, and large bowel are otherwise unremarkable. Appendix normal. No evidence of obstruction or focal inflammation. No free intraperitoneal gas or fluid. Vascular/Lymphatic: Extensive aortoiliac atherosclerotic calcification with particularly atherosclerotic calcification at the origin of the mesenteric and renal arterial vasculature no pathologic adenopathy within the abdomen and pelvis. Reproductive: More prostatic hypertrophy. Other: Moderate diffuse subcutaneous body wall edema. No abdominal hernia. Musculoskeletal: No acute bone abnormality. No lytic or blastic lesion. Review of the MIP images confirms the above findings. IMPRESSION: 1. No pulmonary embolism. No acute intrathoracic or intra-abdominal pathology identified. 2. Moderate right and small left pleural effusions, slightly enlarged on the right with progressive subtotal collapse of the right lower lobe. 3. 25 mm hypodense lesion within the left hepatic dome, similar to prior examination, but indeterminate. This could be better assessed with hepatic protocol MRI examination when clinically appropriate. 4. Mild sigmoid diverticulosis. 5.  Moderate diffuse subcutaneous body wall edema. Aortic Atherosclerosis (ICD10-I70.0). Electronically Signed   By: Worthy Heads M.D.   On: 12/06/2023 01:36   CT Head Wo Contrast Result Date: 12/06/2023 CLINICAL DATA:  Syncopal episode with head trauma. On blood thinner. Neck trauma. EXAM: CT HEAD WITHOUT CONTRAST CT CERVICAL SPINE WITHOUT CONTRAST TECHNIQUE: Multidetector CT imaging of the head and cervical spine was performed following the standard protocol without intravenous contrast. Multiplanar CT image reconstructions of the cervical spine were also generated. RADIATION DOSE REDUCTION: This exam was performed according to the departmental dose-optimization program which includes automated exposure control, adjustment of the mA and/or kV according to patient size and/or use of iterative reconstruction technique. COMPARISON:  CT head and cervical spine 09/30/2022 FINDINGS: CT HEAD FINDINGS Brain:  No intracranial hemorrhage, mass effect, or evidence of acute infarct. No hydrocephalus. No extra-axial fluid collection. Age related cerebral atrophy and chronic small vessel ischemic disease. Chronic bilateral cerebellar infarcts. Chronic lacunar infarct in the right subinsular cortex. Vascular: No hyperdense vessel. Intracranial arterial calcification. Skull: No fracture or focal lesion. Sinuses/Orbits: No acute finding. Other: None. CT CERVICAL SPINE FINDINGS Alignment: No evidence of traumatic malalignment. Leftward rotation of the axis around the dens is favored positional. Skull base and vertebrae: No acute fracture. No primary bone lesion or focal pathologic process. Soft tissues and spinal canal: No prevertebral fluid or swelling. No visible canal hematoma. Disc levels: Multilevel advanced spondylosis with bulky bridging anterior osteophytes, disc space height loss and degenerative endplate changes greatest at C5-C6. Multilevel mild to moderate effacement of the ventral thecal sac due to posterior disc  osteophyte complexes greatest at C5-C6 and C6-C7. No severe spinal canal narrowing. Multilevel facet arthropathy. Partially Upper chest: Partially visualized large bilateral pleural effusions. Other: Carotid calcification. IMPRESSION: 1. No acute intracranial abnormality. 2. No acute fracture in the cervical spine. 3. Partially visualized large bilateral pleural effusions. Electronically Signed   By: Rozell Cornet M.D.   On: 12/06/2023 01:32   CT Cervical Spine Wo Contrast Result Date: 12/06/2023 CLINICAL DATA:  Syncopal episode with head trauma. On blood thinner. Neck trauma. EXAM: CT HEAD WITHOUT CONTRAST CT CERVICAL SPINE WITHOUT CONTRAST TECHNIQUE: Multidetector CT imaging of the head and cervical spine was performed following the standard protocol without intravenous contrast. Multiplanar CT image reconstructions of the cervical spine were also generated. RADIATION DOSE REDUCTION: This exam was performed according to the departmental dose-optimization program which includes automated exposure control, adjustment of the mA and/or kV according to patient size and/or use of iterative reconstruction technique. COMPARISON:  CT head and cervical spine 09/30/2022 FINDINGS: CT HEAD FINDINGS Brain: No intracranial hemorrhage, mass effect, or evidence of acute infarct. No hydrocephalus. No extra-axial fluid collection. Age related cerebral atrophy and chronic small vessel ischemic disease. Chronic bilateral cerebellar infarcts. Chronic lacunar infarct in the right subinsular cortex. Vascular: No hyperdense vessel. Intracranial arterial calcification. Skull: No fracture or focal lesion. Sinuses/Orbits: No acute finding. Other: None. CT CERVICAL SPINE FINDINGS Alignment: No evidence of traumatic malalignment. Leftward rotation of the axis around the dens is favored positional. Skull base and vertebrae: No acute fracture. No primary bone lesion or focal pathologic process. Soft tissues and spinal canal: No prevertebral  fluid or swelling. No visible canal hematoma. Disc levels: Multilevel advanced spondylosis with bulky bridging anterior osteophytes, disc space height loss and degenerative endplate changes greatest at C5-C6. Multilevel mild to moderate effacement of the ventral thecal sac due to posterior disc osteophyte complexes greatest at C5-C6 and C6-C7. No severe spinal canal narrowing. Multilevel facet arthropathy. Partially Upper chest: Partially visualized large bilateral pleural effusions. Other: Carotid calcification. IMPRESSION: 1. No acute intracranial abnormality. 2. No acute fracture in the cervical spine. 3. Partially visualized large bilateral pleural effusions. Electronically Signed   By: Rozell Cornet M.D.   On: 12/06/2023 01:32    Microbiology: No results found for this or any previous visit (from the past 240 hours).  Time spent: 30 minutes  Signed: [place], MD 29-Dec-2023

## 2024-01-05 NOTE — ED Notes (Signed)
 Pt repositioned with pillows. Cleaned up again of incontinence of bowel. Linens changed.

## 2024-01-05 NOTE — ED Provider Notes (Signed)
 [facility] Provider Note    Event Date/Time   First MD Initiated Contact with Patient 12/30/2023 [phone]     (approximate)   History   Rectal Bleeding   HPI Mathew Baker is a 83 y.o. male with history of HFrEF with EF 25 to 30%, CAD s/p PCI, paroxysmal A-fib on Eliquis , acute on chronic symptomatic anemia presenting today for rectal bleeding.  Patient was brought in from [facility] due to dark tarry stools.  He denies any new shortness of breath, cough, congestion, chest pain, abdominal pain, nausea, vomiting.  Chart review: Patient just discharged from the hospital 5 days ago following admission for heart failure and GI bleeding.  During that admission he required 4 unit PRBC transfusion.  He had EGD performed that showed evidence of gastritis but no other obvious upper GI bleed.  At that time, there was discussion for capsule endoscopy to evaluate for small bowel AVMs.  Decision with family was made for no further procedures at that time and he was discharged with improved hemoglobin.     Physical Exam   Triage Vital Signs: ED Triage Vitals  Encounter Vitals Group     BP 12-30-2023 0954 91/67     Systolic BP Percentile --      Diastolic BP Percentile --      Pulse Rate 2023-12-30 0954 98     Resp 12-30-23 0954 14     Temp 2023-12-30 0954 97.9 F (36.6 C)     Temp Source December 30, 2023 0954 Oral     SpO2 12/30/2023 0954 100 %     Weight 2023-12-30 0955 242 lb (109.8 kg)     Height 12-30-2023 0955 6\' 1"  (1.854 m)     Head Circumference --      Peak Flow --      Pain Score 12/30/2023 0955 0     Pain Loc --      Pain Education --      Exclude from Growth Chart --     Most recent vital signs: Vitals:   12/30/2023 0954 2023-12-30 1000  BP: 91/67 108/61  Pulse: 98 65  Resp: 14 18  Temp: 97.9 F (36.6 C)   SpO2: 100% 98%   Physical Exam: I have reviewed the vital signs and nursing notes. General: Awake, alert, no acute distress.  Nontoxic appearing. Head:   Atraumatic, normocephalic.   ENT:  EOM intact, PERRL. Oral mucosa is pink and moist with no lesions. Neck: Neck is supple with full range of motion, No meningeal signs. Cardiovascular:  RRR, No murmurs. Peripheral pulses palpable and equal bilaterally. Respiratory:  Symmetrical chest wall expansion.  No rhonchi, rales, or wheezes.  Good air movement throughout.  No use of accessory muscles.   Musculoskeletal:  No cyanosis or edema. Moving extremities with full ROM Abdomen:  Soft, nontender, nondistended. Rectal: Noticeably blood and melanotic stools Neuro:  GCS 15, moving all four extremities, interacting appropriately. Speech clear. Psych:  Calm, appropriate.   Skin:  Warm, dry, no rash.    ED Results / Procedures / Treatments   Labs (all labs ordered are listed, but only abnormal results are displayed) Labs Reviewed  COMPREHENSIVE METABOLIC PANEL WITH GFR - Abnormal; Notable for the following components:      Result Value   Glucose, Bld 185 (*)    BUN 46 (*)    Calcium  7.9 (*)    Total Protein 4.7 (*)    Albumin 2.5 (*)    All  other components within normal limits  CBC - Abnormal; Notable for the following components:   RBC 2.07 (*)    Hemoglobin 6.3 (*)    HCT 21.6 (*)    MCV 104.3 (*)    MCHC 29.2 (*)    RDW 23.6 (*)    All other components within normal limits  PROTIME-INR - Abnormal; Notable for the following components:   Prothrombin Time 19.8 (*)    INR 1.7 (*)    All other components within normal limits  POC OCCULT BLOOD, ED - Abnormal; Notable for the following components:   Fecal Occult Blood Positive (*)    All other components within normal limits  TYPE AND SCREEN  PREPARE RBC (CROSSMATCH)     EKG My EKG interpretation: Rate of 125, atrial fibrillation with RVR, left axis deviation, possible left bundle branch block.  No acute ST elevations or depressions   RADIOLOGY    PROCEDURES:  Critical Care performed: Yes, see critical care procedure  note(s)  .Critical Care  Performed by: Kandee Orion, MD Authorized by: Kandee Orion, MD   Critical care provider statement:    Critical care time (minutes):  30   Critical care was necessary to treat or prevent imminent or life-threatening deterioration of the following conditions: acute blood loss anemia requiring blood transfusion.   Critical care was time spent personally by me on the following activities:  Development of treatment plan with patient or surrogate, discussions with consultants, evaluation of patient's response to treatment, examination of patient, ordering and review of laboratory studies, ordering and review of radiographic studies, ordering and performing treatments and interventions, pulse oximetry, re-evaluation of patient's condition and review of old charts   I assumed direction of critical care for this patient from another provider in my specialty: no     Care discussed with: admitting provider      MEDICATIONS ORDERED IN ED: Medications  ondansetron  (ZOFRAN ) injection 4 mg (has no administration in time range)  lactated ringers  bolus 1,000 mL (1,000 mLs Intravenous New Bag/Given 21-Jan-2024 0957)     IMPRESSION / MDM / ASSESSMENT AND PLAN / ED COURSE  I reviewed the triage vital signs and the nursing notes.                              Differential diagnosis includes, but is not limited to, lower GI bleed, upper GI bleed, hemorrhoids, acute on chronic anemia  Patient's presentation is most consistent with acute presentation with potential threat to life or bodily function.  Patient is an 83 year old male presenting today for rectal bleeding in the setting of known active GI bleeding.  Rectal exam shows both blood in the rectal vault as well as melanotic stools.  Slightly hypotensive on arrival and given 1 L of fluids.  Laboratory workup shows hemoglobin of 6.3 which is a 3 point drop over the last 5 days from 9.1.  Otherwise no leukocytosis and CMP consistent  with his baseline.  No abdominal tenderness.  Given that he has no GI bleeding at this time, discussed with daughter at bedside.  He reportedly was not a candidate for further procedures given his poor heart function.  Given his rapid drop, will need closer monitoring even after blood transfusion and will admit the hospitalist for discussion between the palliative care team along with GI if there is any additional procedures that could be done or if he will need to be  transition more to comfort care at this point.  The patient is on the cardiac monitor to evaluate for evidence of arrhythmia and/or significant heart rate changes. Clinical Course as of December 28, 2023 1040  Mon 12-28-23  1024 Discussed with daughter at bedside.  He reportedly is not a candidate for further procedures given his poor heart function.  There was discussion with palliative care last time in the hospital but have not fully gone to the point.  She would like to discuss with palliative care again [DW]    Clinical Course User Index [DW] Kandee Orion, MD     FINAL CLINICAL IMPRESSION(S) / ED DIAGNOSES   Final diagnoses:  Acute GI bleeding  Anemia, unspecified type     Rx / DC Orders   ED Discharge Orders     None        Note:  This document was prepared using Dragon voice recognition software and may include unintentional dictation errors.   Kandee Orion, MD 12/28/2023 [phone]

## 2024-01-05 NOTE — ED Notes (Addendum)
 Pt and family requesting to be made comfort care, requesting to stop interventions. MD made aware.

## 2024-01-05 NOTE — ED Provider Notes (Signed)
-----------------------------------------   5:47 PM on Jan 19, 2024 ----------------------------------------- Patient was on comfort care.  Had downtrending blood pressures.  Receiving morphine  as needed for any discomfort as ordered by the hospitalist.  Patient had been experiencing agonal respirations, he is now apneic continues to have mild electrical activity but no palpable pulse or heart sounds.  I listened to the patient's chest for over a minute with no heart sounds auscultated and there was no breathing during this time.  Time of death called at 546 p.m.   Ruth Cove, MD 19-Jan-2024 1750

## 2024-01-05 NOTE — Progress Notes (Signed)
 Blood pressure dropped and family does not wish patient on any pressors or admitted to ICU.  Daughter/POA decided to discontinue current medical management and start comfort care.  Discontinue all medications and start comfort measures including as needed morphine  and other supportive care.

## 2024-01-05 NOTE — ED Triage Notes (Signed)
 Pt BIB [facility] from [facility] d/t dark tarry stools. Hx GI Bleed, anemia, recent blood transfusion. Plavix  listed on med list from facility.

## 2024-01-05 NOTE — ED Notes (Addendum)
 Family requests for blood transfusion to be stopped. Per Dr. Karlynn Oyster, pt to be comfort care and blood transfusion can be stopped.

## 2024-01-05 NOTE — ED Notes (Signed)
 Ordered bereavement tray for family at bedside.

## 2024-01-05 NOTE — ED Notes (Signed)
 Time of Death declared by Dr. Azalee Bolds EDP at [phone].

## 2024-01-05 NOTE — H&P (Signed)
 History and Physical    Mathew Baker WUJ:[id number] DOB: 02/07/1941 DOA: 01-12-24  PCP: Jimmy Moulding, MD (Confirm with patient/family/NH records and if not entered, this has to be entered at [facility] point of entry) Patient coming from: SNF  I have personally briefly reviewed patient's old medical records in [facility]  Chief Complaint: GI bleeding  HPI: Mathew Baker is a 83 y.o. 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  Patient was recently hospitalized for CHF, incision and lower GI bleed, workup including EGD showed no significant bleeding source found, and patient was instructed to follow-up with GI for capsule endoscopy.  Patient continue take aspirin  Plavix  and Eliquis  and he has experienced black tarry stool almost every day for the last 6 days.  Denied any abdominal pain.  Today patient became very weak, complained of shortness of breath and was found confused. ED Course: Afebrile, in rapid A-fib, blood pressure 90s/50 O2*100% room air.  Chest x-ray showed pulmonary congestion.  Blood work showed hemoglobin 6.3 compared to 9.1 on the day of discharge 1 week ago, BUN 46 creatinine 1.1 albumin 2.5.  Patient was given IV bolus 1000 x 1 and ordered PRBC x 1 by ED physician.  Review of Systems: As per HPI otherwise 14 point review of systems negative.    Past Medical History:  Diagnosis Date   Chronic airway obstruction (HCC)    Degeneration of intervertebral disc of lumbar region    Diabetes mellitus without complication (HCC)    Essential hypertension, benign    Hyperlipidemia    Hypertension    Osteoarthritis of lower extremity    Pneumonia, organism unspecified(486)    PSA elevation 07/16/2013   Normal   Stroke [facility])     Past Surgical History:   Procedure Laterality Date   BONE BIOPSY  12/20/2023   Procedure: BIOPSY;  Surgeon: Daina Drum, MD;  Location: [facility] ENDOSCOPY;  Service: Gastroenterology;;   CATARACT EXTRACTION W/ INTRAOCULAR LENS  IMPLANT, BILATERAL     COLONOSCOPY  2006   Normal: Repeat in 10 yrs   ESOPHAGOGASTRODUODENOSCOPY N/A 12/20/2023   Procedure: EGD (ESOPHAGOGASTRODUODENOSCOPY);  Surgeon: Daina Drum, MD;  Location: [facility] ENDOSCOPY;  Service: Gastroenterology;  Laterality: N/A;   IR PERC CHOLECYSTOSTOMY  05/20/2023   IR RADIOLOGIST EVAL & MGMT  06/22/2023   LEFT HEART CATH N/A 11/21/2023   Procedure: Left Heart Cath;  Surgeon: Antonette Batters, MD;  Location: [facility] INVASIVE CV LAB;  Service: Cardiovascular;  Laterality: N/A;  Monday 3/17   LOOP RECORDER INSERTION N/A 02/21/2020   Procedure: LOOP RECORDER INSERTION;  Surgeon: Percival Brace, MD;  Location: [facility] INVASIVE CV LAB;  Service: Cardiovascular;  Laterality: N/A;   LOOP RECORDER REMOVAL N/A 10/23/2020   Procedure: LOOP RECORDER REMOVAL;  Surgeon: Percival Brace, MD;  Location: [facility] INVASIVE CV LAB;  Service: Cardiovascular;  Laterality: N/A;   TEE WITHOUT CARDIOVERSION N/A 09/15/2020   Procedure: TRANSESOPHAGEAL ECHOCARDIOGRAM (TEE);  Surgeon: Ronney Cola, MD;  Location: [facility] ORS;  Service: Cardiovascular;  Laterality: N/A;     reports that he quit smoking about 65 years ago. His smoking use included cigarettes. He has never used smokeless tobacco. He reports that he does not drink alcohol  and does not use drugs.  No Known Allergies  Family History  Problem Relation Age  of Onset   Diabetes Mother     Prior to Admission medications   Medication Sig Start Date End Date Taking? Authorizing Provider  acetaminophen  (TYLENOL ) 325 MG tablet Take 2 tablets (650 mg total) by mouth every 6 (six) hours as needed for mild pain (pain score 1-3) or moderate pain (pain score 4-6) (or Fever >/= 101). 12/21/23  Yes Arrien, Curlee Doss, MD  apixaban   (ELIQUIS ) 5 MG TABS tablet Take 1 tablet (5 mg total) by mouth 2 (two) times daily. 12/21/23  Yes Arrien, Curlee Doss, MD  aspirin  EC 81 MG tablet Take 1 tablet (81 mg total) by mouth daily for 7 days. Swallow whole. 12/22/23 12/29/23 Yes Arrien, Curlee Doss, MD  atorvastatin  (LIPITOR ) 80 MG tablet Take 80 mg by mouth daily.   Yes [provider]  clopidogrel  (PLAVIX ) 75 MG tablet Take 75 mg by mouth daily. 12/03/23 12/02/24 Yes [provider]  docusate sodium  (COLACE) 100 MG capsule Take 100 mg by mouth 2 (two) times daily. 05/02/23  Yes [provider]  empagliflozin  (JARDIANCE ) 25 MG TABS tablet Take 12.5 mg by mouth daily. 12/09/20  Yes [provider]  furosemide  (LASIX ) 40 MG tablet Take 1 tablet (40 mg total) by mouth daily. 12/21/23  Yes Arrien, Mauricio Daniel, MD  insulin  aspart (NOVOLOG ) 100 UNIT/ML injection Inject 0-15 Units into the skin 3 (three) times daily with meals. 12/21/23  Yes Arrien, Mauricio Daniel, MD  insulin  glargine-yfgn (SEMGLEE ) 100 UNIT/ML injection Inject 0.08 mLs (8 Units total) into the skin daily. 12/22/23  Yes Arrien, Curlee Doss, MD  ipratropium-albuterol  (DUONEB) 0.5-2.5 (3) MG/3ML SOLN Take 3 mLs by nebulization every 4 (four) hours. 11/21/23  Yes Wieting, Richard, MD  latanoprost  (XALATAN ) 0.005 % ophthalmic solution Place 1 drop into both eyes at bedtime.   Yes [provider]  midodrine  (PROAMATINE ) 10 MG tablet Take 1 tablet (10 mg total) by mouth 2 (two) times daily. 12/21/23  Yes Arrien, Mauricio Daniel, MD  nitroGLYCERIN (NITROSTAT) 0.4 MG SL tablet Place 0.4 mg under the tongue every 5 (five) minutes as needed for chest pain. 12/02/23 12/01/24 Yes [provider]  ondansetron  (ZOFRAN ) 4 MG/2ML SOLN injection Inject 2 mLs (4 mg total) into the vein every 6 (six) hours as needed for nausea or vomiting. 11/21/23  Yes Wieting, Richard, MD  pantoprazole  (PROTONIX ) 40 MG tablet Take 1 tablet (40 mg total) by  mouth 2 (two) times daily. 12/21/23  Yes Arrien, Mauricio Daniel, MD  polyethylene glycol (MIRALAX  / GLYCOLAX ) 17 g packet Take 17 g by mouth daily as needed for moderate constipation. 11/21/23  Yes Wieting, Richard, MD  tamsulosin  (FLOMAX ) 0.4 MG CAPS capsule Take 0.4 mg by mouth at bedtime.   Yes [provider]  vitamin B-12 (CYANOCOBALAMIN ) 100 MCG tablet Take 100 mcg by mouth daily.   Yes [provider]  Alcohol  Swabs (ALCOHOL  WIPES) 70 % PADS USE FOR INSULIN  INJECTIONS OR TO FINGER STICK AS DIRECTED BY YOUR MEDICAL PROVIDER 04/28/20   [provider]  feeding supplement (ENSURE ENLIVE / ENSURE PLUS) LIQD Take 237 mLs by mouth 3 (three) times daily between meals. 05/24/23   Verla Glaze, MD  glucose blood (PRECISION QID TEST) test strip Use to test blood sugar 2 times daily as instructed. Dx: E11.65 08/22/15   [provider]  Insulin  Pen Needle (PEN NEEDLES 29GX1/2") 29G X MISC Use 1x a day 08/21/14   [provider]    Physical Exam: Vitals:   2024/01/25  1130 Jan 18, 2024 1152 01-18-2024 1200 01/18/2024 1210  BP: 94/60 (!) 60/36 (!) 84/54 (!) 91/58  Pulse: (!) 110 (!) 110 (!) 111 (!) 110  Resp: 19 (!) 22 15 11   Temp: 97.8 F (36.6 C)     TempSrc: Oral     SpO2: 99% 97% 97% 100%  Weight:      Height:        Constitutional: NAD, calm, comfortable Vitals:   18-Jan-2024 1130 2024-01-18 1152 2024/01/18 1200 01/18/2024 1210  BP: 94/60 (!) 60/36 (!) 84/54 (!) 91/58  Pulse: (!) 110 (!) 110 (!) 111 (!) 110  Resp: 19 (!) 22 15 11   Temp: 97.8 F (36.6 C)     TempSrc: Oral     SpO2: 99% 97% 97% 100%  Weight:      Height:       Eyes: PERRL, lids and conjunctivae normal ENMT: Mucous membranes are moist. Posterior pharynx clear of any exudate or lesions.Normal dentition.  Neck: normal, supple, no masses, no thyromegaly Respiratory: clear to auscultation bilaterally, no wheezing, crackles on bilateral lung fields, increasing respiratory effort. No  accessory muscle use.  Cardiovascular: Regular rate and rhythm, no murmurs / rubs / gallops. No extremity edema. 2+ pedal pulses. No carotid bruits.  Abdomen: no tenderness, no masses palpated. No hepatosplenomegaly. Bowel sounds positive.  Musculoskeletal: no clubbing / cyanosis. No joint deformity upper and lower extremities. Good ROM, no contractures. Normal muscle tone.  Skin: no rashes, lesions, ulcers. No induration Neurologic: CN 2-12 grossly intact. Sensation intact, DTR normal. Strength 5/5 in all 4.  Psychiatric: Normal judgment and insight. Alert and oriented x 3. Normal mood.     Labs on Admission: I have personally reviewed following labs and imaging studies  CBC: Recent Labs  Lab 12/20/23 0301 12/21/23 0300 Jan 18, 2024 0951  WBC 4.8 4.9 6.0  HGB 8.9* 9.1* 6.3*  HCT 29.1* 29.7* 21.6*  MCV 99.0 98.7 104.3*  PLT 193 191 182   Basic Metabolic Panel: Recent Labs  Lab 12/20/23 0301 12/20/23 2046 12/21/23 0300 2024/01/18 0951  NA 141  --  138 142  K 3.7  --  4.1 3.7  CL 104  --  103 106  CO2 32  --  29 23  GLUCOSE 211*  --  281* 185*  BUN 23  --  25* 46*  CREATININE 1.06  --  1.19 1.18  CALCIUM  7.9*  --  7.8* 7.9*  PHOS 1.3* 2.7 2.0*  --    GFR: Estimated Creatinine Clearance: 61.7 mL/min (by C-G formula based on SCr of 1.18 mg/dL). Liver Function Tests: Recent Labs  Lab 12/20/23 0301 12/21/23 0300 2024/01/18 0951  AST  --   --  32  ALT  --   --  17  ALKPHOS  --   --  60  BILITOT  --   --  0.8  PROT  --   --  4.7*  ALBUMIN 2.6* 2.7* 2.5*   No results for input(s): "LIPASE", "AMYLASE" in the last 168 hours. No results for input(s): "AMMONIA" in the last 168 hours. Coagulation Profile: Recent Labs  Lab 2024/01/18 0951  INR 1.7*   Cardiac Enzymes: No results for input(s): "CKTOTAL", "CKMB", "CKMBINDEX", "TROPONINI" in the last 168 hours. BNP (last 3 results) No results for input(s): "PROBNP" in the last 8760 hours. HbA1C: No results for input(s):  "HGBA1C" in the last 72 hours. CBG: Recent Labs  Lab 12/20/23 1107 12/20/23 1517 12/20/23 2215 12/21/23 0602 12/21/23 1137  GLUCAP 174* 277* 292*  233* 254*   Lipid Profile: No results for input(s): "CHOL", "HDL", "LDLCALC", "TRIG", "CHOLHDL", "LDLDIRECT" in the last 72 hours. Thyroid Function Tests: No results for input(s): "TSH", "T4TOTAL", "FREET4", "T3FREE", "THYROIDAB" in the last 72 hours. Anemia Panel: No results for input(s): "VITAMINB12", "FOLATE", "FERRITIN", "TIBC", "IRON ", "RETICCTPCT" in the last 72 hours. Urine analysis:    Component Value Date/Time   COLORURINE RED (A) 12/06/2023 0428   APPEARANCEUR CLOUDY (A) 12/06/2023 0428   LABSPEC  12/06/2023 0428    TEST NOT REPORTED DUE TO COLOR INTERFERENCE OF URINE PIGMENT   PHURINE  12/06/2023 0428    TEST NOT REPORTED DUE TO COLOR INTERFERENCE OF URINE PIGMENT   GLUCOSEU (A) 12/06/2023 0428    TEST NOT REPORTED DUE TO COLOR INTERFERENCE OF URINE PIGMENT   HGBUR (A) 12/06/2023 0428    TEST NOT REPORTED DUE TO COLOR INTERFERENCE OF URINE PIGMENT   BILIRUBINUR (A) 12/06/2023 0428    TEST NOT REPORTED DUE TO COLOR INTERFERENCE OF URINE PIGMENT   KETONESUR (A) 12/06/2023 0428    TEST NOT REPORTED DUE TO COLOR INTERFERENCE OF URINE PIGMENT   PROTEINUR (A) 12/06/2023 0428    TEST NOT REPORTED DUE TO COLOR INTERFERENCE OF URINE PIGMENT   NITRITE (A) 12/06/2023 0428    TEST NOT REPORTED DUE TO COLOR INTERFERENCE OF URINE PIGMENT   LEUKOCYTESUR (A) 12/06/2023 0428    TEST NOT REPORTED DUE TO COLOR INTERFERENCE OF URINE PIGMENT    Radiological Exams on Admission: No results found.  EKG: Independently reviewed.  Rapid A-fib  Assessment/Plan Principal Problem:   Lower GI bleed Active Problems:   Acute on chronic systolic (congestive) heart failure (HCC)   A-fib (HCC)   Acute blood loss anemia   GI bleed  (please populate well all problems here in Problem List. (For example, if patient is on BP meds at home and you  resume or decide to hold them, it is a problem that needs to be her. Same for CAD, COPD, HLD and so on)  Recurrent lower GI bleed Acute on chronic blood loss anemia - Secondary to GI bleed.  Upper GI bleed has been ruled by recent EGD.  More likely mediated source suspect that patient has AVM as patient also has a chronic severe aortic stenosis. - Long discussion with patient and his family member, his daughter/POA at bedside, given patient's overall conditions and multiple comorbidities, unlikely patient will tolerate further GI workup, even if bleeding source identified in the future by capsule endoscopy.  As a step back, I will forward CTA to look for bleeding vessel and possible IR intervention.  Family and patient again considered invasive procedure is risky and accepted only transfusion and watchful waiting for the response.  If no significant improvement, family will consider hospice care.  Family agreed with palliative care for now. - Discontinue Eliquis  and Plavix , continue aspirin . - Prognosis extremely poor, estimated life expectancy few days to few weeks without intervention, family made aware and confirmed patient is DNR/DNI  Symptomatic anemia Impending hemorrhagic shock - Conservative management, no pressor or ICU admission as per family - Resume home midodrine   A-fib with RVR - Digoxin  loading as patient has a concurrent symptoms signs of CHF, stage - As needed Lopressor  CAD Stroke -Continue aspirin  and statin - Discontinue Plavix   IIDM -SSI   DVT prophylaxis: SCD Code Status:DNR Family Communication: Daughter/POA at bedside Disposition Plan: Patient is sick with terminal conditions recurrent GI bleed with many other severe prognosis, to the including  CHF, uncontrolled A-fib and severe aortic stenosis, expect more than 2 midnight hospital stay Consults called: Palliative care Admission status: Stepdown unit   [place] MD [facility] Pager  [phone]  01/01/2024, 12:45 PM

## 2024-01-05 NOTE — Consult Note (Signed)
 Consultation Note Date: 01/24/24   Patient Name: Mathew Baker  DOB: 1940/11/09  MRN: [id number]  Age / Sex: 83 y.o., male  PCP: Jimmy Moulding, MD Referring Physician: [place], MD  Reason for Consultation: Establishing goals of care  HPI/Patient Profile: PER H&P: "Mathew Baker is a 83 y.o. 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   Patient was recently hospitalized for CHF, incision and lower GI bleed, workup including EGD showed no significant bleeding source found, and patient was instructed to follow-up with GI for capsule endoscopy.  Patient continue take aspirin  Plavix  and Eliquis  and he has experienced black tarry stool almost every day for the last 6 days.  Denied any abdominal pain.  Today patient became very weak, complained of shortness of breath and was found confused."  Clinical Assessment and Goals of Care: Notes and labs reviewed from this hospitalization.  Patient has been made comfort care by attending team, and orders are in place per their service.  Notes, labs, diagnostics reviewed from time previous to this admission.  Patient is resting in bed at this time, with daughter and a grandchild at bedside.  He is very hard of hearing, but denies complaint at this time.  His daughter states at baseline he lives alone with his wife.  She states he is a very independent man and is retired from a career in the [employer].  She discusses that "the past 5 weeks have been a roller coaster".  She discusses he had not been in rehab more than a few days before coming into this admission.  We discussed his diagnosis, prognosis, GOC, EOL wishes disposition and options.  Created space and  opportunity for patient  to explore thoughts and feelings regarding current medical information.   A detailed discussion was had today regarding advanced directives.  Concepts specific to code status, artifical feeding and hydration, IV antibiotics and rehospitalization were discussed.  The difference between an aggressive medical intervention path and a comfort care path was discussed.  Values and goals of care important to patient and family were attempted to be elicited.  Discussed limitations of medical interventions to prolong quality of life in some situations and discussed the concept of human mortality.  Daughter is clear that patient's quality of life has not been acceptable over these past weeks, and discusses that he has declined.  Patient agrees with this.  We discussed options.  Patient would like to go home with hospice if possible but is unsure if he would have the assistance needed to be able to do this.  Daughter discusses his blood stools.  SUMMARY OF RECOMMENDATIONS   TOC will need to assist with arranging outpatient hospice. Patient and family will need to speak with hospice liaison regarding options for care moving forward.  Medications are currently in place for comfort as  per attending team.  PMT will follow along for needs.  Prognosis:  < 2 weeks       Primary Diagnoses: Present on Admission:  Acute on chronic systolic (congestive) heart failure (HCC)  A-fib (HCC)  Lower GI bleed   I have reviewed the medical record, interviewed the patient and family, and examined the patient. The following aspects are pertinent.  Past Medical History:  Diagnosis Date   Chronic airway obstruction (HCC)    Degeneration of intervertebral disc of lumbar region    Diabetes mellitus without complication (HCC)    Essential hypertension, benign    Hyperlipidemia    Hypertension    Osteoarthritis of lower extremity    Pneumonia, organism unspecified(486)    PSA elevation  07/16/2013   Normal   Stroke [facility])    Social History   Socioeconomic History   Marital status: Married    Spouse name: Not on file   Number of children: Not on file   Years of education: Not on file   Highest education level: Not on file  Occupational History   Not on file  Tobacco Use   Smoking status: Former    Current packs/day: 0.00    Types: Cigarettes    Quit date: 09/06/1958    Years since quitting: 65.3   Smokeless tobacco: Never  Vaping Use   Vaping status: Never Used  Substance and Sexual Activity   Alcohol  use: No    Alcohol /week: 0.0 standard drinks of alcohol    Drug use: No   Sexual activity: Not on file  Other Topics Concern   Not on file  Social History Narrative   Married   Retired; 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; One of the original [employer] in the early 60's   1 daughter   Social Drivers of Corporate investment banker Strain: Low Risk  (11/26/2023)   Received from [facility]   Overall Financial Resource Strain (CARDIA)    Difficulty of Paying Living Expenses: Not hard at all  Food Insecurity: No Food Insecurity (Jan 22, 2024)   Hunger Vital Sign    Worried About Running Out of Food in the Last Year: Never true    Ran Out of Food in the Last Year: Never true  Transportation Needs: No Transportation Needs (01-22-24)   PRAPARE - Administrator, Civil Service (Medical): No    Lack of Transportation (Non-Medical): No  Physical Activity: Not on file  Stress: Not on file  Social Connections: Moderately Integrated (01-22-24)   Social Connection and Isolation Panel [NHANES]    Frequency of Communication with Friends and Family: More than three times a week    Frequency of Social Gatherings with Friends and Family: Once a week    Attends Religious Services: More than 4 times per year    Active Member of [employer] or Organizations: No    Attends Banker Meetings: Never    Marital Status: Married  Recent Concern: Social  Connections - Moderately Isolated (12/13/2023)   Social Connection and Isolation Panel [NHANES]    Frequency of Communication with Friends and Family: Once a week    Frequency of Social Gatherings with Friends and Family: Once a week    Attends Religious Services: More than 4 times per year    Active Member of [employer] or Organizations: No    Attends Banker Meetings: Never    Marital Status: Married   Family History  Problem Relation Age of Onset  Diabetes Mother    Scheduled Meds:  sodium chloride  flush  3-10 mL Intravenous Q12H   Continuous Infusions: PRN Meds:.acetaminophen  **OR** acetaminophen , acetaminophen , glycopyrrolate  **OR** glycopyrrolate  **OR** glycopyrrolate , LORazepam , morphine  injection, ondansetron  **OR** ondansetron  (ZOFRAN ) IV, polyvinyl alcohol , sodium chloride  flush Medications Prior to Admission:  Prior to Admission medications   Medication Sig Start Date End Date Taking? Authorizing Provider  acetaminophen  (TYLENOL ) 325 MG tablet Take 2 tablets (650 mg total) by mouth every 6 (six) hours as needed for mild pain (pain score 1-3) or moderate pain (pain score 4-6) (or Fever >/= 101). 12/21/23  Yes Arrien, Curlee Doss, MD  apixaban  (ELIQUIS ) 5 MG TABS tablet Take 1 tablet (5 mg total) by mouth 2 (two) times daily. 12/21/23  Yes Arrien, Curlee Doss, MD  aspirin  EC 81 MG tablet Take 1 tablet (81 mg total) by mouth daily for 7 days. Swallow whole. 12/22/23 12/29/23 Yes Arrien, Mauricio Daniel, MD  atorvastatin  (LIPITOR ) 80 MG tablet Take 80 mg by mouth daily.   Yes [provider]  clopidogrel  (PLAVIX ) 75 MG tablet Take 75 mg by mouth daily. 12/03/23 12/02/24 Yes [provider]  docusate sodium  (COLACE) 100 MG capsule Take 100 mg by mouth 2 (two) times daily. 05/02/23  Yes [provider]  empagliflozin  (JARDIANCE ) 25 MG TABS tablet Take 12.5 mg by mouth daily. 12/09/20  Yes [provider]  furosemide  (LASIX ) 40 MG tablet Take  1 tablet (40 mg total) by mouth daily. 12/21/23  Yes Arrien, Mauricio Daniel, MD  insulin  aspart (NOVOLOG ) 100 UNIT/ML injection Inject 0-15 Units into the skin 3 (three) times daily with meals. 12/21/23  Yes Arrien, Curlee Doss, MD  insulin  glargine-yfgn (SEMGLEE ) 100 UNIT/ML injection Inject 0.08 mLs (8 Units total) into the skin daily. 12/22/23  Yes Arrien, Curlee Doss, MD  ipratropium-albuterol  (DUONEB) 0.5-2.5 (3) MG/3ML SOLN Take 3 mLs by nebulization every 4 (four) hours. 11/21/23  Yes Wieting, Richard, MD  latanoprost  (XALATAN ) 0.005 % ophthalmic solution Place 1 drop into both eyes at bedtime.   Yes [provider]  midodrine  (PROAMATINE ) 10 MG tablet Take 1 tablet (10 mg total) by mouth 2 (two) times daily. 12/21/23  Yes Arrien, Mauricio Daniel, MD  nitroGLYCERIN (NITROSTAT) 0.4 MG SL tablet Place 0.4 mg under the tongue every 5 (five) minutes as needed for chest pain. 12/02/23 12/01/24 Yes [provider]  ondansetron  (ZOFRAN ) 4 MG/2ML SOLN injection Inject 2 mLs (4 mg total) into the vein every 6 (six) hours as needed for nausea or vomiting. 11/21/23  Yes Wieting, Richard, MD  pantoprazole  (PROTONIX ) 40 MG tablet Take 1 tablet (40 mg total) by mouth 2 (two) times daily. 12/21/23  Yes Arrien, Mauricio Daniel, MD  polyethylene glycol (MIRALAX  / GLYCOLAX ) 17 g packet Take 17 g by mouth daily as needed for moderate constipation. 11/21/23  Yes Wieting, Richard, MD  tamsulosin  (FLOMAX ) 0.4 MG CAPS capsule Take 0.4 mg by mouth at bedtime.   Yes [provider]  vitamin B-12 (CYANOCOBALAMIN ) 100 MCG tablet Take 100 mcg by mouth daily.   Yes [provider]  Alcohol  Swabs (ALCOHOL  WIPES) 70 % PADS USE FOR INSULIN  INJECTIONS OR TO FINGER STICK AS DIRECTED BY YOUR MEDICAL PROVIDER 04/28/20   [provider]  feeding supplement (ENSURE ENLIVE / ENSURE PLUS) LIQD Take 237 mLs by mouth 3 (three) times daily between meals. 05/24/23   Verla Glaze, MD  glucose  blood (PRECISION QID TEST) test strip Use to test blood sugar 2 times daily as instructed.  Dx: E11.65 08/22/15   [provider]  Insulin  Pen Needle (PEN NEEDLES 29GX1/2") 29G X MISC Use 1x a day 08/21/14   [provider]   No Known Allergies Review of Systems  All other systems reviewed and are negative.   Physical Exam Pulmonary:     Effort: Pulmonary effort is normal.  Skin:    General: Skin is warm and dry.  Neurological:     Mental Status: He is alert.     Vital Signs: BP (!) 86/58   Pulse (!) 109   Temp (!) 97.4 F (36.3 C) (Oral)   Resp 10   Ht 6\' 1"  (1.854 m)   Wt 109.8 kg   SpO2 100%   BMI 31.93 kg/m  Pain Scale: 0-10   Pain Score: 0-No pain   SpO2: SpO2: 100 % O2 Device:SpO2: 100 % O2 Flow Rate: .   IO: Intake/output summary:  Intake/Output Summary (Last 24 hours) at 2024-01-14 1522 Last data filed at 01-14-24 1255 Gross per 24 hour  Intake 1130 ml  Output --  Net 1130 ml    LBM:   Baseline Weight: Weight: 109.8 kg Most recent weight: Weight: 109.8 kg       Signed by: Meribeth Standard, NP   Please contact Palliative Medicine Team phone at [phone] for questions and concerns.  For individual provider: See Tilford Foley

## 2024-01-05 NOTE — Progress Notes (Signed)
   January 04, 2024 1440  Spiritual Encounters  Type of Visit Declined chaplain visit  Conversation partners present during encounter Nurse  Referral source Code page  Reason for visit End-of-life  OnCall Visit Yes  Intervention Outcomes  Outcomes Declined chaplain visit  Spiritual Care Plan  Spiritual Care Issues Still Outstanding No further spiritual care needs at this time (see row info)   Patient declined the need for a chaplain

## 2024-01-05 DEATH — deceased

## 2024-02-05 NOTE — Progress Notes (Signed)
 Since patient's family declined CTA study on admission, and his recently GI workup including EGD done on previous hospitalization did not show any bleeding source, the relation between Eliquis  and GI bleed is not clear. But the fact that the patient has been on aspirin  Plavix  and Eliquis  does increase GI bleeding risk.
